# Patient Record
Sex: Male | Born: 1935 | Race: White | Hispanic: No | Marital: Married | Smoking: Former smoker
Health system: Southern US, Community
[De-identification: ages and names within clinical notes are randomized; demographics above are authoritative.]

## PROBLEM LIST (undated history)

## (undated) DIAGNOSIS — Z87438 Personal history of other diseases of male genital organs: Secondary | ICD-10-CM

## (undated) DIAGNOSIS — K5792 Diverticulitis of intestine, part unspecified, without perforation or abscess without bleeding: Secondary | ICD-10-CM

## (undated) DIAGNOSIS — I2510 Atherosclerotic heart disease of native coronary artery without angina pectoris: Secondary | ICD-10-CM

## (undated) DIAGNOSIS — I10 Essential (primary) hypertension: Secondary | ICD-10-CM

## (undated) DIAGNOSIS — E785 Hyperlipidemia, unspecified: Secondary | ICD-10-CM

## (undated) DIAGNOSIS — Z9289 Personal history of other medical treatment: Secondary | ICD-10-CM

## (undated) DIAGNOSIS — H353290 Exudative age-related macular degeneration, unspecified eye, stage unspecified: Secondary | ICD-10-CM

## (undated) DIAGNOSIS — K2270 Barrett's esophagus without dysplasia: Secondary | ICD-10-CM

## (undated) DIAGNOSIS — K5520 Angiodysplasia of colon without hemorrhage: Secondary | ICD-10-CM

## (undated) DIAGNOSIS — U071 COVID-19: Secondary | ICD-10-CM

## (undated) DIAGNOSIS — M549 Dorsalgia, unspecified: Secondary | ICD-10-CM

## (undated) DIAGNOSIS — K219 Gastro-esophageal reflux disease without esophagitis: Secondary | ICD-10-CM

## (undated) DIAGNOSIS — K279 Peptic ulcer, site unspecified, unspecified as acute or chronic, without hemorrhage or perforation: Secondary | ICD-10-CM

## (undated) DIAGNOSIS — Z8673 Personal history of transient ischemic attack (TIA), and cerebral infarction without residual deficits: Secondary | ICD-10-CM

## (undated) DIAGNOSIS — Z9889 Other specified postprocedural states: Secondary | ICD-10-CM

## (undated) DIAGNOSIS — K5790 Diverticulosis of intestine, part unspecified, without perforation or abscess without bleeding: Secondary | ICD-10-CM

## (undated) HISTORY — DX: Peptic ulcer, site unspecified, unspecified as acute or chronic, without hemorrhage or perforation: K27.9

## (undated) HISTORY — DX: Angiodysplasia of colon without hemorrhage: K55.20

## (undated) HISTORY — DX: Atherosclerotic heart disease of native coronary artery without angina pectoris: I25.10

## (undated) HISTORY — DX: Diverticulitis of intestine, part unspecified, without perforation or abscess without bleeding: K57.92

## (undated) HISTORY — DX: Gastro-esophageal reflux disease without esophagitis: K21.9

## (undated) HISTORY — DX: Personal history of other diseases of male genital organs: Z87.438

## (undated) HISTORY — DX: Other specified postprocedural states: Z98.890

## (undated) HISTORY — DX: Diverticulosis of intestine, part unspecified, without perforation or abscess without bleeding: K57.90

## (undated) HISTORY — DX: Personal history of transient ischemic attack (TIA), and cerebral infarction without residual deficits: Z86.73

## (undated) HISTORY — DX: Personal history of other medical treatment: Z92.89

## (undated) HISTORY — DX: Essential (primary) hypertension: I10

## (undated) HISTORY — DX: Barrett's esophagus without dysplasia: K22.70

## (undated) HISTORY — DX: Hyperlipidemia, unspecified: E78.5

## (undated) HISTORY — DX: Dorsalgia, unspecified: M54.9

## (undated) HISTORY — DX: Exudative age-related macular degeneration, unspecified eye, stage unspecified: H35.3290

---

## 1999-08-23 HISTORY — PX: INGUINAL HERNIA REPAIR: SUR1180

## 2000-01-24 ENCOUNTER — Encounter: Admission: RE | Admit: 2000-01-24 | Discharge: 2000-01-24 | Payer: Self-pay | Admitting: Surgery

## 2000-01-24 ENCOUNTER — Encounter: Payer: Self-pay | Admitting: Surgery

## 2000-01-24 ENCOUNTER — Ambulatory Visit (HOSPITAL_BASED_OUTPATIENT_CLINIC_OR_DEPARTMENT_OTHER): Admission: RE | Admit: 2000-01-24 | Discharge: 2000-01-24 | Payer: Self-pay | Admitting: Surgery

## 2003-08-06 ENCOUNTER — Encounter: Admission: RE | Admit: 2003-08-06 | Discharge: 2003-08-06 | Payer: Self-pay | Admitting: Surgery

## 2003-09-23 DIAGNOSIS — K2270 Barrett's esophagus without dysplasia: Secondary | ICD-10-CM

## 2003-09-23 HISTORY — DX: Barrett's esophagus without dysplasia: K22.70

## 2004-07-02 ENCOUNTER — Ambulatory Visit: Payer: Self-pay | Admitting: Internal Medicine

## 2004-07-14 ENCOUNTER — Ambulatory Visit: Payer: Self-pay | Admitting: Internal Medicine

## 2004-12-06 ENCOUNTER — Ambulatory Visit: Payer: Self-pay | Admitting: Family Medicine

## 2004-12-29 ENCOUNTER — Ambulatory Visit: Payer: Self-pay | Admitting: Internal Medicine

## 2005-01-26 ENCOUNTER — Ambulatory Visit: Payer: Self-pay | Admitting: Internal Medicine

## 2005-02-22 ENCOUNTER — Emergency Department (HOSPITAL_COMMUNITY): Admission: EM | Admit: 2005-02-22 | Discharge: 2005-02-22 | Payer: Self-pay | Admitting: Emergency Medicine

## 2005-02-23 ENCOUNTER — Ambulatory Visit: Payer: Self-pay | Admitting: Internal Medicine

## 2005-03-14 ENCOUNTER — Ambulatory Visit: Payer: Self-pay | Admitting: Gastroenterology

## 2005-03-22 ENCOUNTER — Ambulatory Visit: Payer: Self-pay | Admitting: Gastroenterology

## 2005-03-23 ENCOUNTER — Ambulatory Visit: Payer: Self-pay | Admitting: Internal Medicine

## 2005-03-24 ENCOUNTER — Ambulatory Visit: Payer: Self-pay | Admitting: Internal Medicine

## 2005-04-28 ENCOUNTER — Ambulatory Visit: Payer: Self-pay | Admitting: Internal Medicine

## 2005-05-04 ENCOUNTER — Ambulatory Visit: Payer: Self-pay | Admitting: Internal Medicine

## 2005-05-09 ENCOUNTER — Ambulatory Visit: Payer: Self-pay | Admitting: Family Medicine

## 2005-06-08 ENCOUNTER — Ambulatory Visit: Payer: Self-pay | Admitting: Internal Medicine

## 2005-08-03 ENCOUNTER — Ambulatory Visit: Payer: Self-pay | Admitting: Internal Medicine

## 2005-08-04 ENCOUNTER — Ambulatory Visit: Payer: Self-pay | Admitting: Internal Medicine

## 2005-09-09 ENCOUNTER — Ambulatory Visit: Payer: Self-pay | Admitting: Gastroenterology

## 2005-10-11 ENCOUNTER — Ambulatory Visit (HOSPITAL_COMMUNITY): Admission: RE | Admit: 2005-10-11 | Discharge: 2005-10-11 | Payer: Self-pay | Admitting: Gastroenterology

## 2005-10-11 ENCOUNTER — Ambulatory Visit: Payer: Self-pay | Admitting: Gastroenterology

## 2006-03-20 ENCOUNTER — Ambulatory Visit: Payer: Self-pay | Admitting: Internal Medicine

## 2006-03-29 ENCOUNTER — Ambulatory Visit: Payer: Self-pay | Admitting: Internal Medicine

## 2006-04-12 ENCOUNTER — Ambulatory Visit: Payer: Self-pay | Admitting: Internal Medicine

## 2006-04-13 ENCOUNTER — Encounter: Admission: RE | Admit: 2006-04-13 | Discharge: 2006-04-13 | Payer: Self-pay | Admitting: Internal Medicine

## 2006-04-27 ENCOUNTER — Ambulatory Visit: Payer: Self-pay | Admitting: Gastroenterology

## 2006-05-18 ENCOUNTER — Ambulatory Visit: Payer: Self-pay | Admitting: Internal Medicine

## 2006-05-23 LAB — CBC WITH DIFFERENTIAL/PLATELET
BASO%: 0.3 % (ref 0.0–2.0)
Basophils Absolute: 0 10*3/uL (ref 0.0–0.1)
EOS%: 1.7 % (ref 0.0–7.0)
Eosinophils Absolute: 0.1 10*3/uL (ref 0.0–0.5)
HCT: 49.9 % (ref 38.7–49.9)
HGB: 16.9 g/dL (ref 13.0–17.1)
LYMPH%: 14.8 % (ref 14.0–48.0)
MCH: 29.3 pg (ref 28.0–33.4)
MCHC: 33.9 g/dL (ref 32.0–35.9)
MCV: 86.3 fL (ref 81.6–98.0)
MONO#: 0.6 10*3/uL (ref 0.1–0.9)
MONO%: 7.6 % (ref 0.0–13.0)
NEUT#: 5.7 10*3/uL (ref 1.5–6.5)
NEUT%: 75.6 % — ABNORMAL HIGH (ref 40.0–75.0)
Platelets: 256 10*3/uL (ref 145–400)
RBC: 5.78 10*6/uL — ABNORMAL HIGH (ref 4.20–5.71)
RDW: 13.7 % (ref 11.2–14.6)
WBC: 7.6 10*3/uL (ref 4.0–10.0)
lymph#: 1.1 10*3/uL (ref 0.9–3.3)

## 2006-05-27 LAB — VON WILLEBRAND FACTOR MULTIMER
Factor-VIII Activity: 76 % (ref 75–150)
Ristocetin-Cofactor: 88 % (ref 50–150)
Von Willebrand Ag: 75 % normal (ref 61–164)
Von Willebrand Multimers: NORMAL

## 2006-06-06 ENCOUNTER — Ambulatory Visit: Payer: Self-pay | Admitting: Internal Medicine

## 2006-06-12 ENCOUNTER — Ambulatory Visit: Payer: Self-pay | Admitting: Internal Medicine

## 2006-06-15 ENCOUNTER — Encounter: Admission: RE | Admit: 2006-06-15 | Discharge: 2006-06-15 | Payer: Self-pay | Admitting: Internal Medicine

## 2006-06-27 ENCOUNTER — Ambulatory Visit: Payer: Self-pay | Admitting: Internal Medicine

## 2006-09-18 ENCOUNTER — Emergency Department (HOSPITAL_COMMUNITY): Admission: EM | Admit: 2006-09-18 | Discharge: 2006-09-18 | Payer: Self-pay | Admitting: Emergency Medicine

## 2006-10-20 ENCOUNTER — Ambulatory Visit: Payer: Self-pay | Admitting: Internal Medicine

## 2006-10-26 ENCOUNTER — Ambulatory Visit: Payer: Self-pay | Admitting: Gastroenterology

## 2006-11-09 ENCOUNTER — Encounter (INDEPENDENT_AMBULATORY_CARE_PROVIDER_SITE_OTHER): Payer: Self-pay | Admitting: *Deleted

## 2006-11-09 ENCOUNTER — Ambulatory Visit: Payer: Self-pay | Admitting: Gastroenterology

## 2007-04-17 ENCOUNTER — Ambulatory Visit: Payer: Self-pay | Admitting: Internal Medicine

## 2007-04-25 ENCOUNTER — Ambulatory Visit: Payer: Self-pay | Admitting: Internal Medicine

## 2007-05-15 ENCOUNTER — Encounter: Payer: Self-pay | Admitting: *Deleted

## 2007-05-15 DIAGNOSIS — K649 Unspecified hemorrhoids: Secondary | ICD-10-CM | POA: Insufficient documentation

## 2007-05-15 DIAGNOSIS — E7800 Pure hypercholesterolemia, unspecified: Secondary | ICD-10-CM | POA: Insufficient documentation

## 2007-05-15 DIAGNOSIS — S02650A Fracture of angle of mandible, unspecified side, initial encounter for closed fracture: Secondary | ICD-10-CM | POA: Insufficient documentation

## 2007-05-15 DIAGNOSIS — I10 Essential (primary) hypertension: Secondary | ICD-10-CM | POA: Insufficient documentation

## 2007-06-18 ENCOUNTER — Encounter: Payer: Self-pay | Admitting: Internal Medicine

## 2007-06-18 ENCOUNTER — Ambulatory Visit: Payer: Self-pay | Admitting: Internal Medicine

## 2007-06-26 ENCOUNTER — Ambulatory Visit: Payer: Self-pay | Admitting: Internal Medicine

## 2007-06-26 LAB — CONVERTED CEMR LAB
ALT: 26 units/L (ref 0–53)
AST: 26 units/L (ref 0–37)
BUN: 13 mg/dL (ref 6–23)
CO2: 29 meq/L (ref 19–32)
Calcium: 9.4 mg/dL (ref 8.4–10.5)
Chloride: 105 meq/L (ref 96–112)
Cholesterol: 176 mg/dL (ref 0–200)
Creatinine, Ser: 1.1 mg/dL (ref 0.4–1.5)
GFR calc Af Amer: 83 mL/min
GFR calc non Af Amer: 69 mL/min
Glucose, Bld: 99 mg/dL (ref 70–99)
HDL: 40.4 mg/dL (ref >39.0)
LDL Cholesterol: 100 mg/dL — ABNORMAL HIGH (ref 0–99)
PSA: 2.09 ng/mL (ref 0.10–4.00)
Potassium: 3.9 meq/L (ref 3.5–5.1)
Sodium: 140 meq/L (ref 135–145)
Total CHOL/HDL Ratio: 4.4
Triglycerides: 177 mg/dL — ABNORMAL HIGH (ref 0–149)
VLDL: 35 mg/dL (ref 0–40)

## 2007-07-05 ENCOUNTER — Ambulatory Visit: Payer: Self-pay | Admitting: Internal Medicine

## 2007-07-31 ENCOUNTER — Ambulatory Visit: Payer: Self-pay | Admitting: Internal Medicine

## 2007-08-28 ENCOUNTER — Ambulatory Visit: Payer: Self-pay | Admitting: Internal Medicine

## 2007-09-12 ENCOUNTER — Ambulatory Visit: Payer: Self-pay | Admitting: Internal Medicine

## 2007-09-12 ENCOUNTER — Telehealth: Payer: Self-pay | Admitting: Internal Medicine

## 2007-09-12 LAB — CONVERTED CEMR LAB
BUN: 17 mg/dL (ref 6–23)
Bilirubin Urine: NEGATIVE
Creatinine, Ser: 1.1 mg/dL (ref 0.4–1.5)
Hemoglobin, Urine: NEGATIVE
Ketones, ur: NEGATIVE mg/dL
Leukocytes, UA: NEGATIVE
Nitrite: NEGATIVE
Specific Gravity, Urine: <=1.005 (ref 1.000–1.03)
Total Protein, Urine: NEGATIVE mg/dL
Urine Glucose: NEGATIVE mg/dL
Urobilinogen, UA: 0.2 (ref 0.0–1.0)
pH: 5.5 (ref 5.0–8.0)

## 2007-09-14 ENCOUNTER — Ambulatory Visit: Payer: Self-pay | Admitting: Internal Medicine

## 2007-09-18 ENCOUNTER — Telehealth: Payer: Self-pay | Admitting: Internal Medicine

## 2007-09-21 ENCOUNTER — Ambulatory Visit: Payer: Self-pay | Admitting: Internal Medicine

## 2007-09-22 DIAGNOSIS — K429 Umbilical hernia without obstruction or gangrene: Secondary | ICD-10-CM | POA: Insufficient documentation

## 2007-10-16 ENCOUNTER — Ambulatory Visit: Payer: Self-pay | Admitting: Internal Medicine

## 2007-10-16 DIAGNOSIS — M26609 Unspecified temporomandibular joint disorder, unspecified side: Secondary | ICD-10-CM | POA: Insufficient documentation

## 2007-10-31 ENCOUNTER — Ambulatory Visit: Payer: Self-pay | Admitting: Endocrinology

## 2007-12-24 ENCOUNTER — Telehealth: Payer: Self-pay | Admitting: Gastroenterology

## 2008-03-27 ENCOUNTER — Encounter: Payer: Self-pay | Admitting: Gastroenterology

## 2008-04-17 ENCOUNTER — Telehealth: Payer: Self-pay | Admitting: Internal Medicine

## 2008-04-29 ENCOUNTER — Telehealth: Payer: Self-pay | Admitting: Gastroenterology

## 2008-04-29 ENCOUNTER — Telehealth: Payer: Self-pay | Admitting: Internal Medicine

## 2008-05-16 ENCOUNTER — Ambulatory Visit: Payer: Self-pay | Admitting: Gastroenterology

## 2008-05-16 DIAGNOSIS — K2270 Barrett's esophagus without dysplasia: Secondary | ICD-10-CM | POA: Insufficient documentation

## 2008-06-04 ENCOUNTER — Ambulatory Visit: Payer: Self-pay | Admitting: Internal Medicine

## 2008-07-07 ENCOUNTER — Ambulatory Visit: Payer: Self-pay | Admitting: Internal Medicine

## 2008-07-07 LAB — CONVERTED CEMR LAB
ALT: 20 units/L (ref 0–53)
AST: 26 units/L (ref 0–37)
Albumin: 4 g/dL (ref 3.5–5.2)
Alkaline Phosphatase: 75 units/L (ref 39–117)
BUN: 15 mg/dL (ref 6–23)
Basophils Absolute: 0.1 10*3/uL (ref 0.0–0.1)
Basophils Relative: 0.8 % (ref 0.0–3.0)
Bilirubin, Direct: 0.1 mg/dL (ref 0.0–0.3)
CO2: 28 meq/L (ref 19–32)
Calcium: 8.6 mg/dL (ref 8.4–10.5)
Chloride: 107 meq/L (ref 96–112)
Cholesterol: 168 mg/dL (ref 0–200)
Creatinine, Ser: 1.3 mg/dL (ref 0.4–1.5)
Direct LDL: 79.3 mg/dL
Eosinophils Absolute: 0.3 10*3/uL (ref 0.0–0.7)
Eosinophils Relative: 3.9 % (ref 0.0–5.0)
GFR calc Af Amer: 68 mL/min
GFR calc non Af Amer: 56 mL/min
Glucose, Bld: 84 mg/dL (ref 70–99)
HCT: 44.1 % (ref 39.0–52.0)
HDL: 35.8 mg/dL — ABNORMAL LOW (ref >39.0)
Hemoglobin: 15.4 g/dL (ref 13.0–17.0)
Lymphocytes Relative: 33.3 % (ref 12.0–46.0)
MCHC: 34.9 g/dL (ref 30.0–36.0)
MCV: 89.6 fL (ref 78.0–100.0)
Monocytes Absolute: 0.7 10*3/uL (ref 0.1–1.0)
Monocytes Relative: 8.3 % (ref 3.0–12.0)
Neutro Abs: 4.4 10*3/uL (ref 1.4–7.7)
Neutrophils Relative %: 53.7 % (ref 43.0–77.0)
PSA: 2.49 ng/mL (ref 0.10–4.00)
Platelets: 179 10*3/uL (ref 150–400)
Potassium: 3.4 meq/L — ABNORMAL LOW (ref 3.5–5.1)
RBC: 4.92 M/uL (ref 4.22–5.81)
RDW: 12.3 % (ref 11.5–14.6)
Sodium: 141 meq/L (ref 135–145)
Total Bilirubin: 0.6 mg/dL (ref 0.3–1.2)
Total CHOL/HDL Ratio: 4.7
Total Protein: 6.7 g/dL (ref 6.0–8.3)
Triglycerides: 238 mg/dL (ref 0–149)
VLDL: 48 mg/dL — ABNORMAL HIGH (ref 0–40)
WBC: 8.2 10*3/uL (ref 4.5–10.5)

## 2008-08-20 ENCOUNTER — Telehealth: Payer: Self-pay | Admitting: Gastroenterology

## 2008-08-21 ENCOUNTER — Ambulatory Visit: Payer: Self-pay | Admitting: Internal Medicine

## 2008-08-21 DIAGNOSIS — R1319 Other dysphagia: Secondary | ICD-10-CM | POA: Insufficient documentation

## 2008-08-23 DIAGNOSIS — D680 Von Willebrand's disease: Secondary | ICD-10-CM | POA: Insufficient documentation

## 2008-08-27 ENCOUNTER — Encounter: Payer: Self-pay | Admitting: Gastroenterology

## 2008-08-27 ENCOUNTER — Ambulatory Visit: Payer: Self-pay | Admitting: Gastroenterology

## 2008-08-29 ENCOUNTER — Encounter: Payer: Self-pay | Admitting: Gastroenterology

## 2008-09-03 ENCOUNTER — Encounter: Payer: Self-pay | Admitting: Gastroenterology

## 2008-10-01 ENCOUNTER — Telehealth: Payer: Self-pay | Admitting: Gastroenterology

## 2008-10-15 ENCOUNTER — Ambulatory Visit: Payer: Self-pay | Admitting: Gastroenterology

## 2008-10-22 ENCOUNTER — Ambulatory Visit: Payer: Self-pay | Admitting: Gastroenterology

## 2008-10-22 LAB — HM COLONOSCOPY

## 2008-11-25 ENCOUNTER — Telehealth: Payer: Self-pay | Admitting: Internal Medicine

## 2009-02-24 ENCOUNTER — Ambulatory Visit: Payer: Self-pay | Admitting: Internal Medicine

## 2009-03-09 ENCOUNTER — Telehealth: Payer: Self-pay | Admitting: Internal Medicine

## 2009-05-05 ENCOUNTER — Telehealth: Payer: Self-pay | Admitting: Gastroenterology

## 2009-05-06 ENCOUNTER — Encounter: Payer: Self-pay | Admitting: Internal Medicine

## 2009-05-29 ENCOUNTER — Encounter: Payer: Self-pay | Admitting: Internal Medicine

## 2009-06-03 ENCOUNTER — Ambulatory Visit: Payer: Self-pay | Admitting: Internal Medicine

## 2009-06-03 ENCOUNTER — Telehealth (INDEPENDENT_AMBULATORY_CARE_PROVIDER_SITE_OTHER): Payer: Self-pay | Admitting: *Deleted

## 2009-06-03 ENCOUNTER — Encounter (INDEPENDENT_AMBULATORY_CARE_PROVIDER_SITE_OTHER): Payer: Self-pay

## 2009-06-09 ENCOUNTER — Telehealth: Payer: Self-pay | Admitting: Internal Medicine

## 2009-07-01 ENCOUNTER — Encounter: Payer: Self-pay | Admitting: Internal Medicine

## 2009-07-15 ENCOUNTER — Telehealth (INDEPENDENT_AMBULATORY_CARE_PROVIDER_SITE_OTHER): Payer: Self-pay | Admitting: *Deleted

## 2009-07-21 ENCOUNTER — Encounter: Payer: Self-pay | Admitting: Internal Medicine

## 2009-07-27 ENCOUNTER — Ambulatory Visit: Payer: Self-pay | Admitting: Internal Medicine

## 2009-07-27 DIAGNOSIS — H9190 Unspecified hearing loss, unspecified ear: Secondary | ICD-10-CM | POA: Insufficient documentation

## 2009-07-27 LAB — CONVERTED CEMR LAB
ALT: 20 units/L (ref 0–53)
AST: 21 units/L (ref 0–37)
Albumin: 4.3 g/dL (ref 3.5–5.2)
Alkaline Phosphatase: 89 units/L (ref 39–117)
BUN: 15 mg/dL (ref 6–23)
Basophils Absolute: 0.1 10*3/uL (ref 0.0–0.1)
Basophils Relative: 0.8 % (ref 0.0–3.0)
Bilirubin, Direct: 0.1 mg/dL (ref 0.0–0.3)
CO2: 31 meq/L (ref 19–32)
Calcium: 9.3 mg/dL (ref 8.4–10.5)
Chloride: 103 meq/L (ref 96–112)
Cholesterol: 191 mg/dL (ref 0–200)
Creatinine, Ser: 1.1 mg/dL (ref 0.4–1.5)
Direct LDL: 105.9 mg/dL
Eosinophils Absolute: 0.2 10*3/uL (ref 0.0–0.7)
Eosinophils Relative: 3.2 % (ref 0.0–5.0)
GFR calc non Af Amer: 68.15 mL/min (ref >60)
Glucose, Bld: 94 mg/dL (ref 70–99)
HCT: 49.6 % (ref 39.0–52.0)
HDL: 47.5 mg/dL (ref >39.00)
Hemoglobin: 17 g/dL (ref 13.0–17.0)
Lymphocytes Relative: 24.6 % (ref 12.0–46.0)
Lymphs Abs: 1.8 10*3/uL (ref 0.7–4.0)
MCHC: 34.3 g/dL (ref 30.0–36.0)
MCV: 88.8 fL (ref 78.0–100.0)
Monocytes Absolute: 0.7 10*3/uL (ref 0.1–1.0)
Monocytes Relative: 9.2 % (ref 3.0–12.0)
Neutro Abs: 4.7 10*3/uL (ref 1.4–7.7)
Neutrophils Relative %: 62.2 % (ref 43.0–77.0)
PSA: 2.47 ng/mL (ref 0.10–4.00)
Platelets: 198 10*3/uL (ref 150.0–400.0)
Potassium: 4.2 meq/L (ref 3.5–5.1)
RBC: 5.58 M/uL (ref 4.22–5.81)
RDW: 12.4 % (ref 11.5–14.6)
Sodium: 141 meq/L (ref 135–145)
Total Bilirubin: 0.8 mg/dL (ref 0.3–1.2)
Total CHOL/HDL Ratio: 4
Total Protein: 7.3 g/dL (ref 6.0–8.3)
Triglycerides: 205 mg/dL — ABNORMAL HIGH (ref 0.0–149.0)
VLDL: 41 mg/dL — ABNORMAL HIGH (ref 0.0–40.0)
WBC: 7.5 10*3/uL (ref 4.5–10.5)
aPTT: 29.4 s — ABNORMAL HIGH (ref 21.7–28.8)

## 2009-09-01 ENCOUNTER — Encounter: Payer: Self-pay | Admitting: Internal Medicine

## 2009-09-22 ENCOUNTER — Encounter: Payer: Self-pay | Admitting: Gastroenterology

## 2009-10-01 ENCOUNTER — Encounter: Payer: Self-pay | Admitting: Gastroenterology

## 2009-10-05 ENCOUNTER — Encounter: Payer: Self-pay | Admitting: Internal Medicine

## 2009-11-03 ENCOUNTER — Encounter: Payer: Self-pay | Admitting: Internal Medicine

## 2009-11-03 ENCOUNTER — Telehealth: Payer: Self-pay | Admitting: Gastroenterology

## 2009-11-04 ENCOUNTER — Telehealth: Payer: Self-pay | Admitting: Internal Medicine

## 2009-11-30 ENCOUNTER — Ambulatory Visit: Payer: Self-pay | Admitting: Gastroenterology

## 2009-11-30 DIAGNOSIS — K5520 Angiodysplasia of colon without hemorrhage: Secondary | ICD-10-CM | POA: Insufficient documentation

## 2009-12-07 ENCOUNTER — Encounter: Payer: Self-pay | Admitting: Internal Medicine

## 2009-12-08 ENCOUNTER — Encounter: Payer: Self-pay | Admitting: Internal Medicine

## 2009-12-17 ENCOUNTER — Ambulatory Visit: Payer: Self-pay | Admitting: Internal Medicine

## 2009-12-18 ENCOUNTER — Encounter: Payer: Self-pay | Admitting: Internal Medicine

## 2009-12-18 LAB — CONVERTED CEMR LAB
ALT: 22 units/L (ref 0–53)
AST: 23 units/L (ref 0–37)
Albumin: 3.8 g/dL (ref 3.5–5.2)
Alkaline Phosphatase: 74 units/L (ref 39–117)
BUN: 10 mg/dL (ref 6–23)
Bilirubin, Direct: 0.1 mg/dL (ref 0.0–0.3)
CO2: 31 meq/L (ref 19–32)
Calcium: 9.1 mg/dL (ref 8.4–10.5)
Chloride: 104 meq/L (ref 96–112)
Creatinine, Ser: 1.1 mg/dL (ref 0.4–1.5)
GFR calc non Af Amer: 68.09 mL/min (ref >60)
Glucose, Bld: 89 mg/dL (ref 70–99)
Phosphorus: 3.5 mg/dL (ref 2.3–4.6)
Potassium: 4.2 meq/L (ref 3.5–5.1)
Sodium: 140 meq/L (ref 135–145)
Total Bilirubin: 0.6 mg/dL (ref 0.3–1.2)
Total CK: 142 units/L (ref 7–232)
Total Protein: 6.5 g/dL (ref 6.0–8.3)

## 2010-01-04 ENCOUNTER — Encounter: Payer: Self-pay | Admitting: Internal Medicine

## 2010-01-22 ENCOUNTER — Telehealth: Payer: Self-pay | Admitting: Internal Medicine

## 2010-03-22 ENCOUNTER — Encounter: Payer: Self-pay | Admitting: Internal Medicine

## 2010-05-20 ENCOUNTER — Ambulatory Visit: Payer: Self-pay | Admitting: Internal Medicine

## 2010-07-29 ENCOUNTER — Encounter: Payer: Self-pay | Admitting: Internal Medicine

## 2010-07-29 ENCOUNTER — Ambulatory Visit: Payer: Self-pay | Admitting: Internal Medicine

## 2010-07-29 LAB — CONVERTED CEMR LAB
ALT: 17 units/L (ref 0–53)
AST: 22 units/L (ref 0–37)
Albumin: 4.1 g/dL (ref 3.5–5.2)
Alkaline Phosphatase: 75 units/L (ref 39–117)
BUN: 17 mg/dL (ref 6–23)
Basophils Absolute: 0 10*3/uL (ref 0.0–0.1)
Basophils Relative: 0.2 % (ref 0.0–3.0)
Bilirubin, Direct: 0.1 mg/dL (ref 0.0–0.3)
CO2: 29 meq/L (ref 19–32)
Calcium: 9.3 mg/dL (ref 8.4–10.5)
Chloride: 103 meq/L (ref 96–112)
Cholesterol: 238 mg/dL — ABNORMAL HIGH (ref 0–200)
Creatinine, Ser: 1 mg/dL (ref 0.4–1.5)
Direct LDL: 148.8 mg/dL
Eosinophils Absolute: 0.1 10*3/uL (ref 0.0–0.7)
Eosinophils Relative: 0.6 % (ref 0.0–5.0)
GFR calc non Af Amer: 72.53 mL/min (ref >60.00)
Glucose, Bld: 101 mg/dL — ABNORMAL HIGH (ref 70–99)
HCT: 44.9 % (ref 39.0–52.0)
HDL: 45 mg/dL (ref >39.00)
Hemoglobin: 15.3 g/dL (ref 13.0–17.0)
Lymphocytes Relative: 10.4 % — ABNORMAL LOW (ref 12.0–46.0)
Lymphs Abs: 1.3 10*3/uL (ref 0.7–4.0)
MCHC: 34 g/dL (ref 30.0–36.0)
MCV: 87.6 fL (ref 78.0–100.0)
Monocytes Absolute: 1 10*3/uL (ref 0.1–1.0)
Monocytes Relative: 8 % (ref 3.0–12.0)
Neutro Abs: 10.2 10*3/uL — ABNORMAL HIGH (ref 1.4–7.7)
Neutrophils Relative %: 80.8 % — ABNORMAL HIGH (ref 43.0–77.0)
Platelets: 237 10*3/uL (ref 150.0–400.0)
Potassium: 4.3 meq/L (ref 3.5–5.1)
RBC: 5.13 M/uL (ref 4.22–5.81)
RDW: 14 % (ref 11.5–14.6)
Sodium: 140 meq/L (ref 135–145)
TSH: 2.36 microintl units/mL (ref 0.35–5.50)
Total Bilirubin: 0.6 mg/dL (ref 0.3–1.2)
Total CHOL/HDL Ratio: 5
Total Protein: 7 g/dL (ref 6.0–8.3)
Triglycerides: 134 mg/dL (ref 0.0–149.0)
VLDL: 26.8 mg/dL (ref 0.0–40.0)
WBC: 12.7 10*3/uL — ABNORMAL HIGH (ref 4.5–10.5)

## 2010-08-04 ENCOUNTER — Telehealth: Payer: Self-pay | Admitting: Internal Medicine

## 2010-08-05 ENCOUNTER — Telehealth: Payer: Self-pay | Admitting: Internal Medicine

## 2010-09-23 NOTE — Assessment & Plan Note (Signed)
Summary: VOMIT'G ALL NIGHT-$50 INFO-DR NORINS PT-STC   Vital Signs:  Patient Profile:   77 Years Old Male Height:     68 inches Weight:      170.9 pounds Temp:     97.5 degrees F oral Pulse rate:   87 / minute BP sitting:   147 / 83  (left arm) Cuff size:   regular  Vitals Entered By: Orlan Leavens (October 31, 2007 8:49 AM)                 Visit Type:  Acute Visit PCP:  Norins  Chief Complaint:  HAVING ABDOMINAL PAIN, DIARRHEA, and BEEN VOMITTING ALL NIGHT.  History of Present Illness: 2 days n/v/d/abd cramps    Current Allergies: No known allergies   Past Medical History:    Reviewed history from 05/15/2007 and no changes required:       Hx of FX, ANGLE OF JAW, CLOSED (ICD-802.25)       BARRETT'S ESOPHAGUS, HX OF (ICD-V12.79)       GERD (ICD-530.81)       PROSTATITIS, HX OF (ICD-V13.09)       Hx of HEMORRHOIDS (ICD-455.6)       HYPERLIPIDEMIA (ICD-272.4)       HYPERTENSION (ICD-401.9)             Review of Systems  The patient denies fever.     Physical Exam  General:     normal appearance.   Abdomen:     abdomen soft and non-tender without masses, organomegaly, or hernias noted    Impression & Recommendations:  Problem # 1:  GASTROENTERITIS (ICD-558.9)  Orders: Est. Patient Level III ([address])   Medications Added to Medication List This Visit: 1)  Zofran Odt 4 Mg Tbdp (Ondansetron) .... Q4h as needed nausea   Patient Instructions: 1)  zofran odt 4 mg q4h as needed 2)  tylenol/fluids 3)  ret prn    Prescriptions: ZOFRAN ODT 4 MG  TBDP (ONDANSETRON) q4h as needed nausea  #30 x 0   Entered and Authorized by:   Minus Breeding MD   Signed by:   Minus Breeding MD on 10/31/2007   Method used:   Electronically sent to ...       [facility]*       [address]       [place], [place]  [address]       Ph: [phone] or [phone]       Fax: [phone]   RxID:   [id number]  ]

## 2010-09-23 NOTE — Assessment & Plan Note (Signed)
Summary: severe cough -sinus drainage-$50--stc   Vital Signs:  Patient profile:   77 year old male Height:      68 inches Weight:      163.75 pounds BMI:     24.99 O2 Sat:      95 % on Room air Temp:     97.7 degrees F oral Pulse rate:   82 / minute BP sitting:   132 / 62  (left arm) Cuff size:   regular  Vitals Entered By: Beola Cord, CMA (February 24, 2009 9:43 AM)  O2 Flow:  Room air  Primary Care [facility]ta[facility] [place]:  Illene Regulus, MD   History of Present Illness: Two week history of cough with lots of phlegm -mostly clear, no fever, no pain, no chest discomfort. Not more dyspneic than usual. He has not been taking any medications. He did try OTC allergy pills without success, cough drops didn't help.   Current Medications (verified): 1)  Aspirin 81 Mg  Tabs (Aspirin) .... Take One Tablet Once Daily 2)  Lovastatin 40 Mg  Tabs (Lovastatin) .... Take One Tablet Once Daily 3)  Gabapentin 300 Mg  Caps (Gabapentin) .... Take One Tablet 3 Times Daily 4)  Tylenol .... Take As Needed 5)  Norvasc 10 Mg Tabs (Amlodipine Besylate) .... Take 1 Tablet By Mouth Once A Day 6)  Nexium 40 Mg Cpdr (Esomeprazole Magnesium) .... One Tablet By Mouth Two Times A Day 7)  Alrex 0.2 % Susp (Loteprednol Etabonate) .... 2 Drops Once Daily As Needed 8)  Multivitamins  Tabs (Multiple Vitamin) .... Take 1 Tablet By Mouth Once A Day  Allergies (verified): 1)  ! 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  The patient denies anorexia, fever,  weight loss, weight gain, hoarseness, chest pain, dyspnea on exertion, headaches, hemoptysis, abdominal pain, muscle weakness, and depression.    Physical Exam  General:  WNWD white male NAD Head:  Normocephalic and atraumatic without obvious abnormalities. No apparent alopecia or balding. Lungs:  normal respiratory effort, normal breath sounds, no crackles, and no wheezes.   Heart:  normal rate and regular rhythm.     Impression & Recommendations:  Problem # 1:  COUGH (ICD-786.2)  Cough that is persistent; suspect cyclical cough.  Plan: CXR          Z-pak, promethazine/cod 1 tsp q 6, tessalon perles, prednisone 20 mg once daily x 7  Orders: T-2 View CXR (71020TC)  Complete Medication List: 1)  Aspirin 81 Mg Tabs (Aspirin) .... Take one tablet once daily 2)  Lovastatin 40 Mg Tabs (Lovastatin) .... Take one tablet once daily 3)  Gabapentin 300 Mg Caps (Gabapentin) .... Take one tablet 3 times daily 4)  Tylenol  .... Take as needed 5)  Norvasc 10 Mg Tabs (Amlodipine besylate) .... Take 1 tablet by mouth once a day 6)  Nexium 40 Mg Cpdr (Esomeprazole magnesium) .... One tablet by mouth two times a day 7)  Alrex 0.2 % Susp (Loteprednol etabonate) .... 2 drops once daily as needed 8)  Multivitamins Tabs (Multiple vitamin) .... Take 1 tablet by mouth once a day 9)  Promethazine-codeine 6.25-10 Mg/45ml Syrp (Promethazine-codeine) .[name] Kitchen.. 1 tsp q 6 as needed for cough 10)  Azithromycin 250 Mg Tabs (Azithromycin) .... 2 tabs day #1, 1 tab days 2-5 11)  Benzonatate 100 Mg Caps (Benzonatate) .[name] Kitchen.. 1 by mouth three times a day x 10 days for cough 12)  Prednisone 20 Mg Tabs (Prednisone) .[name] Kitchen.. 1 by mouth once daily x 10  Patient Instructions: 1)  cough - either a viral upper respiratory infection or perhaps mycoplasma. You also have a cyclical cough. Plan - z-pak as directed, codeine containing cough syrup every 6 hours as needed, benzonatate perles three times a day to sooth the trachea, prednisone  20mg  daily to help with tracheal irritation. Hydrate.  Prescriptions: PREDNISONE 20 MG TABS (PREDNISONE) 1 by mouth once daily x 10  #10 x 0   Entered and Authorized by:   Jacques Navy MD   Signed by:   Jacques Navy MD on 02/24/2009   Method used:   Electronically to        [facility]* (retail)       [address]       [place], [place]  [address]       Ph: [phone]       Fax: [phone]   RxID:   [id number] BENZONATATE 100 MG CAPS (BENZONATATE) 1 by mouth three times a day x 10 days for cough  #30 x 0   Entered and Authorized by:   Jacques Navy MD   Signed by:   Jacques Navy MD on 02/24/2009   Method used:   Electronically to        [facility]* (retail)       [address]       [place], [place]  [address]       Ph: [phone]       Fax: [phone]   RxID:   [id number] AZITHROMYCIN 250 MG TABS (AZITHROMYCIN) 2 tabs day #1, 1 tab days 2-5  #6 x 0   Entered and Authorized by:   Jacques Navy MD   Signed by:   Jacques Navy MD on 02/24/2009   Method used:   Electronically to        [facility]* (retail)       [address]       [place], [place]  [address]       Ph: [phone]       Fax: [phone]   RxID:   [id number] PROMETHAZINE-CODEINE 6.25-10 MG/5ML SYRP (PROMETHAZINE-CODEINE) 1 tsp q 6 as needed for cough  #8 oz x 1   Entered and Authorized by:   Jacques Navy MD   Signed by:   Jacques Navy MD on 02/24/2009   Method used:   Print then Give to Patient   RxID:   [id number]

## 2010-09-23 NOTE — Progress Notes (Signed)
  Phone Note Other Incoming   Caller: Pts wife-Emma Details for Reason: call regarding Lipitor Summary of Call: Pts wife is calling and states that pt is allergic to lipitor and cannot take this medication. She stated that she would like to speak with Dr Debby Bud personally this morning.  Initial call taken by: Ami Bullins CMA,  August 05, 2010 9:01 AM  Follow-up for Phone Call        called patient: my mistake on lipitor. his LDL 148.8 can treat with diet alone. Spoke with pt and his wife. They are ok with diet alone.  Follow-up by: Jacques Navy MD,  August 05, 2010 9:26 AM

## 2010-09-23 NOTE — Progress Notes (Signed)
Summary: Recall colon   Phone Note Outgoing Call Call back at [facility] Phone [phone]   Call placed by: Christie Nottingham CMA,  October 01, 2008 3:15 PM Call placed to: Patient Summary of Call: Called pt to schedule recall colonoscopy. Pt agreed and scheduled for 10-22-08  and previsit on 10/15/08.  Initial call taken by: Christie Nottingham CMA,  October 01, 2008 3:16 PM

## 2010-09-23 NOTE — Consult Note (Signed)
Summary: [facility]  [facility]   Imported By: Lester [place] 11/06/2009 10:27:06  _____________________________________________________________________  External Attachment:    Type:   Image     Comment:   External Document

## 2010-09-23 NOTE — Procedures (Signed)
Summary: Colonoscopy   Colonoscopy  Procedure date:  10/22/2008  Findings:      Location:  [facility].    COLONOSCOPY PROCEDURE REPORT  PATIENT:  [name]  MR#:  [id number] BIRTHDATE:   03-Dec-1925   GENDER:   male  ENDOSCOPIST:   Venita Lick. Russella Dar, MD, [facility] Referred by:   PROCEDURE DATE:  10/22/2008 PROCEDURE:  Average-risk screening colonoscopy ASA CLASS:   Class II INDICATIONS: 1) screening   MEDICATIONS:    Fentanyl 50 mcg IV, Versed 4 mg IV  DESCRIPTION OF PROCEDURE:   After the risks benefits and alternatives of the procedure were thoroughly explained, informed consent was obtained.  Digital rectal exam was performed and revealed no abnormalities.   The LB [id number] endoscope was introduced through the anus and advanced to the cecum, which was identified by both the appendix and ileocecal valve, without limitations.  The quality of the prep was excellent, using MoviPrep.  Thin red fluid was noted in several areas throughout the colon and we questioned the patients wife who related that he drank red Gatorade with the prep. The instrument was then slowly withdrawn as the colon was fully examined. <<PROCEDUREIMAGES>>          <<OLD IMAGES>>  FINDINGS:  Mild diverticulosis was found in the sigmoid colon.  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 It was non-bleeding. It was 3 mm in size. This was otherwise a normal examination.  Retroflexed views in the rectum revealed internal hemorrhoids, medium. The time to cecum =  3.5  minutes. The scope was then withdrawn (time =  10.75  min) from the patient and the procedure completed.  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    Venita Lick. Russella Dar, MD,  [facility]    CC: Jacques Navy MD

## 2010-09-23 NOTE — Assessment & Plan Note (Signed)
   Vital Signs:  Patient Profile:   77 Years Old Male Height:     68 inches Weight:      171 pounds Temp:     97.7 degrees F oral Pulse rate:   76 / minute BP sitting:   158 / 87                 PCP:  Norins   History of Present Illness: [name] presents to day with low back pain, lower abdominal, urinary and frequency. NO fever, chills, no N/V.    Past Surgical History:    Inguinal herniorrhaphy-2001   Family History:    father-died at 98. CAD/MI    mother-died 86. HTN, cancer-pancreatic    1 sister - arthritis, Lipids    1 brother - bone cancer secondary to prostate cancer    No DM, colon cancer  Social History:    married 10-Jan-1951    1 son, 1 daughter-deceased at 5 months    retired-fulltime. "[employer]"   Risk Factors:  Passive smoke exposure:  no Drug use:  no HIV high-risk behavior:  no Caffeine use:  1 drinks per day Alcohol use:  no Exercise:  no Seatbelt use:  100 %   Review of Systems  The patient denies fever, weight loss, chest pain, syncope, dyspnea on exhertion, and prolonged cough.     Physical Exam  General:     Well-developed,well-nourished,in no acute distress; alert,appropriate and cooperative throughout examination    Impression & Recommendations:  Problem # 1:  PROSTATITIS, ACUTE (ICD-601.0) classic symptoms. Similar to previous episode of pratatitis. Deferring rectal exam based on history and symptoms.  Plan: cipro 500mg  two times a day x 14  Complete Medication List: 1)  Aspirin 81 Mg Tabs (Aspirin) .... Take one tablet once daily 2)  Omeprazole 20 Mg Tbec (Omeprazole) .... Take 2 tablets twice daily 3)  Lovastatin 40 Mg Tabs (Lovastatin) .... Take one tablet once daily 4)  Gabapentin 300 Mg Caps (Gabapentin) .... Take one tablet 3 times daily 5)  Tylenol  .... Take as needed 6)  Hydrochlorothiazide 25 Mg Tabs (Hydrochlorothiazide) .... Take 1 tablet by mouth once a day 7)  Norvasc 10 Mg Tabs (Amlodipine besylate)  .... Take 1 tablet by mouth once a day 8)  Cipro 500 Mg Tabs (Ciprofloxacin hcl) .... Two times a day   Patient Instructions: 1)  Please schedule a follow-up appointment as needed. 2)  Take antibiotics as instructed. Drink a lot of fluids.    Prescriptions: CIPRO 500 MG  TABS (CIPROFLOXACIN HCL) two times a day  #28 x 1   Entered and Authorized by:   Martin Navy MD   Signed by:   Martin Navy MD on 06/18/2007   Method used:   Print then Give to Patient   RxID:   [id number]  ]

## 2010-09-23 NOTE — Consult Note (Signed)
Summary: [facility]  [facility]   Imported By: Sherian Rein 12/10/2009 13:58:10  _____________________________________________________________________  External Attachment:    Type:   Image     Comment:   External Document

## 2010-09-23 NOTE — Progress Notes (Signed)
Summary: Nexium samples   Phone Note Call from Patient Call back at Home Phone [phone]   Call For: Dr Russella Dar Reason for Call: Talk to Nurse Summary of Call: Larey Seat into [facility] hole and wonders if we have any Nexium 40mg  they can have please. Initial call taken by: Leanor Kail [facility],  May 05, 2009 11:14 AM  Follow-up for Phone Call        pts wife aware samples out front for pick up.  Follow-up by: Harlow Mares CMA (AAMA),  May 05, 2009 1:38 PM    New/Updated Medications: NEXIUM 40 MG CPDR (ESOMEPRAZOLE MAGNESIUM) one tablet by mouth two times a day

## 2010-09-23 NOTE — Progress Notes (Signed)
Summary: Medication  Medications Added NEXIUM 40 MG CPDR (ESOMEPRAZOLE MAGNESIUM) one tablet by mouth two times a day  Keep appt for any further refills.       Phone Note Call from Patient Call back at [facility] Phone [phone]   Caller: Patient Call For: Dr. Russella Dar Reason for Call: Talk to Nurse Summary of Call: Pt needs his Nexium refilled has an appt. on 11-30-09 and completely out Initial call taken by: Karna Christmas,  November 03, 2009 11:22 AM  Follow-up for Phone Call        Rx was sent to pts pharmacy and pt notified to keep appt for any further refills. Follow-up by: Christie Nottingham CMA Duncan Dull),  November 03, 2009 11:49 AM    New/Updated Medications: NEXIUM 40 MG CPDR (ESOMEPRAZOLE MAGNESIUM) one tablet by mouth two times a day  Keep appt for any further refills. Prescriptions: NEXIUM 40 MG CPDR (ESOMEPRAZOLE MAGNESIUM) one tablet by mouth two times a day  Keep appt for any further refills.  #60 x 0   Entered by:   Christie Nottingham CMA (AAMA)   Authorized by:   Meryl Dare MD [facility]   Signed by:   Christie Nottingham CMA (AAMA) on 11/03/2009   Method used:   Electronically to        [facility]* (retail)       [address]       [place], [place]  [address]       Ph: [phone]       Fax: [phone]   RxID:   [id number]

## 2010-09-23 NOTE — Assessment & Plan Note (Signed)
Summary: YEARLY FU/ MEDIARE/ LABS SAME DAY/ COMING SAME DAY AS WIFE/NW...   Vital Signs:  Patient profile:   77 year old male Height:      68 inches Weight:      168 pounds BMI:     25.64 O2 Sat:      96 % on Room air Temp:     97.5 degrees F oral Pulse rate:   70 / minute BP sitting:   126 / 68  (left arm) Cuff size:   regular  Vitals Entered By: Bill Salinas CMA (July 27, 2009 9:08 AM)  O2 Flow:  Room air CC: yearly follow up / pt declined pneumonia shot/ ab   Primary Care Provider:  Illene Regulus, MD  CC:  yearly follow up / pt declined pneumonia shot/ ab.  History of Present Illness: Patinet has had fluid behind the TM with 90% hearing loss. He has seen ENT- had myringotomy tubes placed but they clogged. He had no restoration of hearing with initial placement. He had tried anti biotic and steroid drops to no avail. No imaging. He does seem to be a candidate for hearing aids at this point.   He had a cough productive of a lot of phlegm which gave him some stomach discomfort. He took allergy pills which helped.   He is otherwise doing ok with no complaints.   Current Medications (verified): 1)  Aspirin 81 Mg  Tabs (Aspirin) .... Take One Tablet Once Daily 2)  Lovastatin 40 Mg  Tabs (Lovastatin) .... Take One Tablet Once Daily 3)  Gabapentin 300 Mg  Caps (Gabapentin) .... Take One Tablet 3 Times Daily 4)  Tylenol .... Take As Needed 5)  Norvasc 10 Mg Tabs (Amlodipine Besylate) .... Take 1 Tablet By Mouth Once A Day 6)  Nexium 40 Mg Cpdr (Esomeprazole Magnesium) .... One Tablet By Mouth Two Times A Day 7)  Multivitamins  Tabs (Multiple Vitamin) .... Take 1 Tablet By Mouth Once A Day 8)  Ofloxacin 0.3 % Soln (Ofloxacin) .... 4 Drops Two Times A Day in Both Ears  Allergies (verified): 1)  ! Lipitor (Atorvastatin)  Past History:  Family History: Last updated: 07-14-2007 father-died at 94. CAD/MI mother-died 86. HTN, cancer-pancreatic 1 sister - arthritis, Lipids 1  brother - bone cancer secondary to prostate cancer No DM, colon cancer  Social History: Last updated: 05/16/2008 married 1950 1 son, 1 daughter-deceased at 5 months retired-fulltime. "honey-do's" Patient is a former smoker. Quit 25+ yrs ago Illicit Drug Use - no  Past Medical History: Hx of FX, ANGLE OF JAW, CLOSED (ICD-802.25) BARRETT'S ESOPHAGUS, HX OF (ICD-V12.79) GERD (ICD-530.81) PROSTATITIS, HX OF (ICD-V13.09) Hx of HEMORRHOIDS (ICD-455.6) HYPERLIPIDEMIA (ICD-272.4) HYPERTENSION (ICD-401.9)  von Willebrand's syndrome Gastritis Peptic Ulcer Disease Hearing Loss  Past Surgical History: Reviewed history from 2007/07/14 and no changes required. Inguinal herniorrhaphy-2001  Review of Systems       The patient complains of decreased hearing.  The patient denies anorexia, fever, weight loss, weight gain, vision loss, hoarseness, chest pain, syncope, dyspnea on exertion, peripheral edema, prolonged cough, hemoptysis, abdominal pain, incontinence, muscle weakness, transient blindness, difficulty walking, unusual weight change, enlarged lymph nodes, angioedema, and breast masses.    Physical Exam  General:  WNWD white male looking younger than his stated age.  Head:  normocephalic, atraumatic, and no abnormalities observed.   Eyes:  vision grossly intact, pupils equal, pupils round, corneas and lenses clear, and no injection.   Ears:  EACs clear; myringotomy tubes visualized  in the inferior quadrant both TMs, TM without erythema. Hearing is markedly decreased. Nose:  no external deformity and no external erythema.   Mouth:  edentulous with upper denture only. No oral lesions. Throat is clear Neck:  supple, full ROM, no thyromegaly, and no carotid bruits.   Chest Wall:  no deformities and no tenderness.   Lungs:  Normal respiratory effort, chest expands symmetrically. Lungs are clear to auscultation, no crackles or wheezes. Heart:  Normal rate and regular rhythm. S1 and S2  normal without gallop, murmur, click, rub or other extra sounds. Abdomen:  Bowel sounds positive,abdomen soft and non-tender without masses, organomegaly or hernias noted. Msk:  normal ROM, no joint tenderness, no joint swelling, no joint warmth, and no joint instability.   Pulses:  2+ radial and DP pulses Extremities:  No clubbing, cyanosis, edema, or deformity noted with normal full range of motion of all joints.   Neurologic:  alert & oriented X3 and cranial nerves II-XII intact except for marked hearing loss.  Gait normal, DTRs normal. Skin:  turgor normal, color normal, no suspicious lesions, and no ulcerations.   Cervical Nodes:  no anterior cervical adenopathy and no posterior cervical adenopathy.   Psych:  Oriented X3, memory intact for recent and remote, normally interactive, good eye contact, and not anxious appearing.     Impression & Recommendations:  Problem # 1:  DYSPHAGIA (ZOX-096.04) no complaints of swallowing problems  Problem # 2:  HYPERLIPIDEMIA (ICD-272.4) Due for routine lab with recommendation   His updated medication list for this problem includes:    Lovastatin 40 Mg Tabs (Lovastatin) .[name] Kitchen... Take one tablet once daily  Orders: TLB-Lipid Panel (80061-LIPID) TLB-Hepatic/Liver Function Pnl (80076-HEPATIC)  Problem # 3:  Hx of HEMORRHOIDS (ICD-455.6) He will use suppositoris as needed. He does not use stool softener or bulk laxation  Plan - recommend he use a bulk laxative, i.e. metamucil to keep risk of hemorrhoids down.  Problem # 4:  HYPERTENSION (ICD-401.9)  His updated medication list for this problem includes:    Norvasc 10 Mg Tabs (Amlodipine besylate) .[name] Kitchen... Take 1 tablet by mouth once a day  Orders: TLB-BMP (Basic Metabolic Panel-BMET) (80048-METABOL)  BP today: 126/68 Prior BP: 132/62 (02/24/2009)  Adequate control. Labs peninding.   Problem # 5:  HEARING LOSS, BILATERAL (ICD-389.9) Marked hearing loss.  Plan - per Dr. Lazarus Salines  Problem #  6:  Preventive Health Care (ICD-V70.0) Normal exam. current with colorectal cancer screening. For PSA today. He declines offer of pneumovax.  Lab results are good with well controlled cholesterol levels, normal blood sugar and normal PSA  In summary - a nice guy who is in good health except for hearing loss. He will return as needed.  Complete Medication List: 1)  Aspirin 81 Mg Tabs (Aspirin) .... Take one tablet once daily 2)  Lovastatin 40 Mg Tabs (Lovastatin) .... Take one tablet once daily 3)  Gabapentin 300 Mg Caps (Gabapentin) .... Take one tablet 3 times daily 4)  Tylenol  .... Take as needed 5)  Norvasc 10 Mg Tabs (Amlodipine besylate) .... Take 1 tablet by mouth once a day 6)  Nexium 40 Mg Cpdr (Esomeprazole magnesium) .... One tablet by mouth two times a day 7)  Multivitamins Tabs (Multiple vitamin) .... Take 1 tablet by mouth once a day 8)  Ofloxacin 0.3 % Soln (Ofloxacin) .... 4 drops two times a day in both ears  Other Orders: TLB-CBC Platelet - w/Differential (85025-CBCD) TLB-PTT (85730-PTTL) TLB-PSA (Prostate Specific Antigen) (84153-PSA)  Patient: [name] Note: All result statuses are Final unless otherwise noted.  Tests: (1) CBC Platelet w/Diff (CBCD)   White Cell Count          7.5 K/uL                    4.5-10.5   Red Cell Count            5.58 Mil/uL                 4.22-5.81   Hemoglobin                17.0 g/dL                   04.5-40.9   Hematocrit                49.6 %                      39.0-52.0   MCV                       88.8 fl                     78.0-100.0   MCHC                      34.3 g/dL                   81.1-91.4   RDW                       12.4 %                      11.5-14.6   Platelet Count            198.0 K/uL                  150.0-400.0   Neutrophil %              62.2 %                      43.0-77.0   Lymphocyte %              24.6 %                      12.0-46.0   Monocyte %                9.2 %                        3.0-12.0   Eosinophils%              3.2 %                       0.0-5.0   Basophils %               0.8 %                       0.0-3.0   Neutrophill Absolute      4.7 K/uL                    1.4-7.7   Lymphocyte Absolute  1.8 K/uL                    0.7-4.0   Monocyte Absolute         0.7 K/uL                    0.1-1.0  Eosinophils, Absolute                             0.2 K/uL                    0.0-0.7   Basophils Absolute        0.1 K/uL                    0.0-0.1  Tests: (2) PTT (PTTL)   Appt Time            [H]  29.4 SEC                    21.7-28.8  Tests: (3) Lipid Panel (LIPID)   Cholesterol               191 mg/dL                   0-272     ATP III Classification            Desirable:  < 200 mg/dL                    Borderline High:  200 - 239 mg/dL               High:  > = 240 mg/dL   Triglycerides        [H]  205.0 mg/dL                 5.3-664.4     Normal:  <150 mg/dL     Borderline High:  034 - 199 mg/dL   HDL                       74.25 mg/dL                 >95.63   VLDL Cholesterol     [H]  41.0 mg/dL                  8.7-56.4  CHO/HDL Ratio:  CHD Risk                             4                    Men          Women     1/2 Average Risk     3.4          3.3     Average Risk          5.0          4.4     2X Average Risk          9.6          7.1     3X Average Risk          15.0          11.0  Tests: (4) Hepatic/Liver Function Panel (HEPATIC)   Total Bilirubin           0.8 mg/dL                   1.6-1.0   Direct Bilirubin          0.1 mg/dL                   9.6-0.4   Alkaline Phosphatase      89 U/L                      39-117   AST                       21 U/L                      0-37   ALT                       20 U/L                      0-53   Total Protein             7.3 g/dL                    5.4-0.9   Albumin                   4.3 g/dL                    8.1-1.9  Tests: (5) BMP (METABOL)   Sodium                     141 mEq/L                   135-145   Potassium                 4.2 mEq/L                   3.5-5.1   Chloride                  103 mEq/L                   96-112   Carbon Dioxide            31 mEq/L                    19-32   Glucose                   94 mg/dL                    14-78   BUN                       15 mg/dL                    2-95   Creatinine                1.1 mg/dL                   6.2-1.3   Calcium  9.3 mg/dL                   5.2-84.1   GFR                       68.15 mL/min                >60  Tests: (6) Prostate Specific Antigen (PSA)   PSA-Hyb                   2.47 ng/mL                  0.10-4.00  Tests: (7) Cholesterol LDL - Direct (DIRLDL)  Cholesterol LDL - Direct                             105.9 mg/dLPrescriptions: LOVASTATIN 40 MG  TABS (LOVASTATIN) Take one tablet once daily  #30 x 12   Entered and Authorized by:   Jacques Navy MD   Signed by:   Jacques Navy MD on 07/27/2009   Method used:   Electronically to        [facility]* (retail)       [address]       [place], [place]  [address]       Ph: [phone]       Fax: [phone]   RxID:   [id number]

## 2010-09-23 NOTE — Progress Notes (Signed)
Summary: RESULTS  Phone Note Call from Patient Call back at Home Phone [phone]   Summary of Call: Patient is requesting results of cxr Initial call taken by: Lamar Sprinkles,  March 09, 2009 10:08 AM  Follow-up for Phone Call        No acute disease. Stable COPD Follow-up by: Jacques Navy MD,  March 09, 2009 1:12 PM  Additional Follow-up for Phone Call Additional follow up Details #1::        Pt informed  Additional Follow-up by: Lamar Sprinkles,  March 09, 2009 3:45 PM

## 2010-09-23 NOTE — Progress Notes (Signed)
  Phone Note Refill Request Message from:  Fax from Pharmacy  Refills Requested: Medication #1:  NORVASC 10 MG TABS Take 1 tablet by mouth once a day Initial call taken by: Zackery Barefoot CMA,  November 25, 2008 6:55 PM  Follow-up for Phone Call        Rx completed in Dr. Tiajuana Amass Follow-up by: Zackery Barefoot CMA,  November 25, 2008 6:55 PM      Prescriptions: NORVASC 10 MG TABS (AMLODIPINE BESYLATE) Take 1 tablet by mouth once a day  #30 x 6   Entered by:   Zackery Barefoot CMA   Authorized by:   Jacques Navy MD   Signed by:   Zackery Barefoot CMA on 11/25/2008   Method used:   Electronically to        [facility]* (retail)       [address]       [place], [place]  [address]       Ph: [phone]       Fax: [phone]   RxID:   [id number]

## 2010-09-23 NOTE — Assessment & Plan Note (Signed)
Summary: CPX-STC   Vital Signs:  Patient Profile:   77 Years Old Male Height:     68 inches Weight:      174 pounds Temp:     97.5 degrees F oral Pulse rate:   81 / minute BP sitting:   145 / 74  (left arm) Cuff size:   large  Vitals Entered By: Zackery Barefoot CMA (July 05, 2007 1:29 PM)                 PCP:  [facility]  Chief Complaint:  Preventive Care.  History of Present Illness: presents for general exam. Still with some lower abdominal pain. Finished cipro x 14 days, but still had some discomfort. Stopped cholesterol medicine but it really hasn't made a difference.  Current Allergies: No known allergies   Past Medical History:    Reviewed history from 05/15/2007 and no changes required:       Hx of FX, ANGLE OF JAW, CLOSED (ICD-802.25)       BARRETT'S ESOPHAGUS, HX OF (ICD-V12.79)       GERD (ICD-530.81)       PROSTATITIS, HX OF (ICD-V13.09)       Hx of HEMORRHOIDS (ICD-455.6)       HYPERLIPIDEMIA (ICD-272.4)       HYPERTENSION (ICD-401.9)          Past Surgical History:    Reviewed history from 06/18/2007 and no changes required:       Inguinal herniorrhaphy-2001   Family History:    Reviewed history from 06/18/2007 and no changes required:       father-died at 65. CAD/MI       mother-died 86. HTN, cancer-pancreatic       1 sister - arthritis, Lipids       1 brother - bone cancer secondary to prostate cancer       No DM, colon cancer  Social History:    Reviewed history from 06/18/2007 and no changes required:       married 1952       1 son, 1 daughter-deceased at 5 months       retired-fulltime. "honey-do's"   Risk Factors:  Colonoscopy History:     Date of Last Colonoscopy:  08/22/2004    Results:  done    Review of Systems  The patient denies anorexia, fever, weight loss, decreased hearing, chest pain, syncope, dyspnea on exhertion, peripheral edema, severe indigestion/heartburn, difficulty walking, abnormal bleeding, and testicular  masses.     Physical Exam  General:     Well-developed,well-nourished,in no acute distress; alert,appropriate and cooperative throughout examination Head:     Normocephalic and atraumatic without obvious abnormalities. No apparent alopecia or balding. Eyes:     No corneal or conjunctival inflammation noted. EOMI. Perrla. Funduscopic exam benign, without hemorrhages, exudates or papilledema. Vision grossly normal. Ears:     External ear exam shows no significant lesions or deformities.  Otoscopic examination reveals clear canals, tympanic membranes are intact bilaterally without bulging, retraction, inflammation or discharge. Hearing is grossly normal bilaterally. Mouth:     no gingival abnormalities, pharynx pink and moist, no posterior lymphoid hypertrophy, no pharyngeal crowing, and edentulous.   Neck:     No deformities, masses, or tenderness noted. Chest Wall:     No deformities, masses, tenderness or gynecomastia noted. Lungs:     Normal respiratory effort, chest expands symmetrically. Lungs are clear to auscultation, no crackles or wheezes. Heart:     Normal rate and regular rhythm.  S1 and S2 normal without gallop, murmur, click, rub or other extra sounds. Abdomen:     Bowel sounds positive,abdomen soft and non-tender without masses, organomegaly or hernias noted. Rectal:     no external abnormalities, no hemorrhoids, normal sphincter tone, and no masses.   Prostate:     no gland enlargement.  Firm, no nodules. Minimal tenderness Msk:     normal ROM, no joint tenderness, no joint swelling, and no joint warmth.   Pulses:     R and L carotid,radial,femoral,dorsalis pedis and posterior tibial pulses are full and equal bilaterally Extremities:     No clubbing, cyanosis, edema, or deformity noted with normal full range of motion of all joints.   Neurologic:     No cranial nerve deficits noted. Station and gait are normal. Plantar reflexes are down-going bilaterally. DTRs are  symmetrical throughout. Sensory, motor and coordinative functions appear intact. Skin:     two lipomas: left back, right scapular area.no rashes, no suspicious lesions, no petechiae, and no purpura.   Cervical Nodes:     No lymphadenopathy noted Axillary Nodes:     No palpable lymphadenopathy Psych:     Cognition and judgment appear intact. Alert and cooperative with normal attention span and concentration. No apparent delusions, illusions, hallucinations    Impression & Recommendations:  Problem # 1:  PROSTATITIS, HX OF (ICD-V13.09) still with symptoms but better.  Plan: second round cipro 500mg  two times a day x 14.  Problem # 2:  GERD (ICD-530.81)  His updated medication list for this problem includes:    Omeprazole 20 Mg Tbec (Omeprazole) .[name] Kitchen... Take 2 tablets twice daily  Stable Plan: continue present regimen   Problem # 3:  HYPERLIPIDEMIA (ICD-272.4)  His updated medication list for this problem includes:    Lovastatin 40 Mg Tabs (Lovastatin) .[name] Kitchen... Take one tablet once daily  Labs Reviewed: Chol: 176 (06/26/2007)   HDL: 40.4 (06/26/2007)   LDL: 100 (06/26/2007)   TG: 177 (06/26/2007) SGOT: 26 (06/26/2007)   SGPT: 26 (06/26/2007)  Plan: continue medications.   Problem # 4:  HYPERTENSION (ICD-401.9)  The following medications were removed from the medication list:    Hydrochlorothiazide 25 Mg Tabs (Hydrochlorothiazide) .[name] Kitchen... Take 1 tablet by mouth once a day  His updated medication list for this problem includes:    Norvasc 10 Mg Tabs (Amlodipine besylate) .[name] Kitchen... Take 1 tablet by mouth once a day  BP today: 145/74 Prior BP: 158/87 (06/18/2007)  Plan continue present medications.  Labs Reviewed: Creat: 1.1 (06/26/2007) Chol: 176 (06/26/2007)   HDL: 40.4 (06/26/2007)   LDL: 100 (06/26/2007)   TG: 177 (06/26/2007)   Problem # 5:  Preventive Health Care (ICD-V70.0) Normal prostate exam. PSA 2.09-normal range and lower than the last test. Up to date on  colorectal cancer screening.  Complete Medication List: 1)  Aspirin 81 Mg Tabs (Aspirin) .... Take one tablet once daily 2)  Omeprazole 20 Mg Tbec (Omeprazole) .... Take 2 tablets twice daily 3)  Lovastatin 40 Mg Tabs (Lovastatin) .... Take one tablet once daily 4)  Gabapentin 300 Mg Caps (Gabapentin) .... Take one tablet 3 times daily 5)  Tylenol  .... Take as needed 6)  Norvasc 10 Mg Tabs (Amlodipine besylate) .... Take 1 tablet by mouth once a day     ]  Preventive Care Screening  Colonoscopy:    Date:  08/22/2004    Results:  done

## 2010-09-23 NOTE — Assessment & Plan Note (Signed)
Summary: FLU [facility] Sherlean Foot Natale Milch   Nurse Visit   Vital Signs:  Patient profile:   77 year old male Temp:     97.3 degrees F oral  Vitals Entered By: Lanier Prude, [facility]) (May 20, 2010 2:41 PM)  Allergies: 1)  ! Lipitor (Atorvastatin)  Orders Added: 1)  Flu Vaccine 40yrs + MEDICARE PATIENTS [Q2039] 2)  Administration Flu vaccine - MCR [G0008] .lbmedflu   Flu Vaccine Consent Questions     Do you have a history of severe allergic reactions to this vaccine? no    Any prior history of allergic reactions to egg and/or gelatin? no    Do you have a sensitivity to the preservative Thimersol? no    Do you have a past history of Guillan-Barre Syndrome? no    Do you currently have an acute febrile illness? no    Have you ever had a severe reaction to latex? no    Vaccine information given and explained to patient? yes    Are you currently pregnant? no    Lot Number:[id number]   Exp Date:02/19/2011   Site Given  Left Deltoid IM Lanier Prude, [facility])  May 20, 2010 2:41 PM

## 2010-09-23 NOTE — Letter (Signed)
Summary: [facility]  [facility]   Imported By: Sherian Rein 10/10/2009 10:13:17  _____________________________________________________________________  External Attachment:    Type:   Image     Comment:   External Document

## 2010-09-23 NOTE — Miscellaneous (Signed)
Summary: Nexium refill  Clinical Lists Changes  Medications: Changed medication from OMEPRAZOLE 40 MG  CPDR (OMEPRAZOLE) Take 1 tablet by mouth two times a day 30 minutes before a meal. to NEXIUM 40 MG CPDR (ESOMEPRAZOLE MAGNESIUM) one tablet by mouth two times a day - Signed Rx of NEXIUM 40 MG CPDR (ESOMEPRAZOLE MAGNESIUM) one tablet by mouth two times a day;  #60 x 11;  Signed;  Entered by: Christie Nottingham CMA;  Authorized by: Meryl Dare MD [facility];  Method used: Electronically to [facility] [address], [place], [place]  [address], Ph: [phone], Fax: [phone]    Prescriptions: NEXIUM 40 MG CPDR (ESOMEPRAZOLE MAGNESIUM) one tablet by mouth two times a day  #60 x 11   Entered by:   Christie Nottingham CMA   Authorized by:   Meryl Dare MD [facility]   Signed by:   Christie Nottingham CMA on 08/29/2008   Method used:   Electronically to        [facility]* (retail)       [address]       [place], [place]  [address]       Ph: [phone]       Fax: [phone]   RxID:   [id number]

## 2010-09-23 NOTE — Assessment & Plan Note (Signed)
Summary: YEARLY-LABS WILL BE DONE DAY AFTER YEARLY--STC   Vital Signs:  Patient profile:   77 year old male Height:      68 inches Weight:      166 pounds BMI:     25.33 O2 Sat:      95 % on Room air Temp:     97.1 degrees F oral Pulse rate:   82 / minute BP sitting:   132 / 64  (left arm) Cuff size:   regular  Vitals Entered By: Bill Salinas CMA (July 29, 2010 1:17 PM)  O2 Flow:  Room air  Primary Care Provider:  Illene Regulus, MD   History of Present Illness: Patient presents for general medical follow-up and preventive care.   He does c/o sore for several . No dysphagia. No fever, no chills. He also has a sore neck but not tender to touch. He has no limitations in activity. He does get relief with APAP. He has crepitis with neck rolls.  He does have some low back pain that is a chronic problem. It is not dependent on activity but it is positional. This is really not any worse.  He has severe hearing loss. ENT without explanation. He is doing  better with hearing aids.  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   He remains independent in ADLs. He is not depressed although he is a bit irritible. He hasn't had any falls. He is cognitively intact managing all his activities and finances and chores and keeping up with his calendar.   Preventive Screening-Counseling & Management  Alcohol-Tobacco     Alcohol drinks/day: 0     Smoking Status: quit     Year Started: 1985  Caffeine-Diet-Exercise     Caffeine use/day: 1 cup per day     Diet Comments: regular     Diet Counseling: not indicated; diet is assessed to be healthy     Does Patient Exercise: no  Hep-HIV-STD-Contraception     Hepatitis Risk: no risk noted     HIV Risk: no risk noted     STD Risk: no risk noted     Dental Visit-last 6 months no     TSE monthly: no     Sun Exposure-Excessive: no  Safety-Violence-Falls     Seat Belt Use: yes     Helmet Use: n/a  Firearms in the Home: firearms in the home     Smoke Detectors: yes     Violence in the Home: no risk noted     Sexual Abuse: no     Fall Risk: slight fall risk      Sexual History:  currently monogamous.        Drug Use:  never.        Blood Transfusions:  no.    Current Medications (verified): 1)  Aspirin 81 Mg  Tabs (Aspirin) .... Take One Tablet Once Daily 2)  Lovastatin 40 Mg  Tabs (Lovastatin) .... Take One Tablet Once Daily 3)  Gabapentin 300 Mg  Caps (Gabapentin) .... Take One Tablet 3 Times Daily 4)  Tylenol .... Take As Needed 5)  Norvasc 10 Mg Tabs (Amlodipine Besylate) .... Take 1 Tablet By Mouth Once A Day 6)  Nexium 40 Mg Cpdr (Esomeprazole Magnesium) .... One Tablet By Mouth Two Times A Day 7)  Multivitamins  Tabs (Multiple Vitamin) .... Take 1 Tablet By Mouth Once A Day 8)  Avastin 100 Mg/6ml Soln (Bevacizumab) .... Right  Eye 9)  Ocuvite Adult 50+  Caps (Multiple Vitamins-Minerals) .[name] Kitchen.. 1 Capsule Daily 10)  Tylenol 325 Mg Tabs (Acetaminophen) .... As Needed  Allergies (verified): 1)  ! Lipitor (Atorvastatin)  Past History:  Past Medical History: Last updated: 11/30/2009 Hx of FX, ANGLE OF JAW, CLOSED (ICD-802.25) BARRETT'S ESOPHAGUS, HX OF (ICD-V12.79) GERD (ICD-530.81) PROSTATITIS, HX OF (ICD-V13.09) Hx of HEMORRHOIDS (ICD-455.6) HYPERLIPIDEMIA (ICD-272.4) HYPERTENSION (ICD-401.9) von Willebrand's syndrome Gastritis Peptic Ulcer Disease Hearing Loss Cecal AVM Diverticulosis  Past Surgical History: Last updated: Jun 20, 2007 Inguinal herniorrhaphy-2001  Family History: Last updated: 06/20/07 father-died at 17. CAD/MI mother-died 86. HTN, cancer-pancreatic 1 sister - arthritis, Lipids 1 brother - bone cancer secondary to prostate cancer No DM, colon cancer  Social History: Last updated: 05/16/2008 married 1950 1 son, 1 daughter-deceased at 5 months retired-fulltime. "honey-do's" Patient is a former smoker. Quit 25+ yrs ago Illicit Drug Use  - no  Social History: Caffeine use/day:  1 cup per day Dental Care w/in 6 mos.:  no Sun Exposure-Excessive:  no Seat Belt Use:  yes Fall Risk:  slight fall risk Hepatitis Risk:  no risk noted HIV Risk:  no risk noted STD Risk:  no risk noted Sexual History:  currently monogamous Drug Use:  never Blood Transfusions:  no  Review of Systems  The patient denies anorexia, fever, weight loss, weight gain, decreased hearing, chest pain, syncope, dyspnea on exertion, prolonged cough, abdominal pain, severe indigestion/heartburn, muscle weakness, difficulty walking, depression, enlarged lymph nodes, and angioedema.    Physical Exam  General:  WNWD elderly white male with hearing aids in place Head:  normocephalic, atraumatic, and no abnormalities observed.   Eyes:  pupils equal and pupils round.  Furhter exam deferred to Opthalmology (Dr.Saunders). Ears:  hearing aid left ear. EACs clear,TMs normal Nose:  no external deformity and no external erythema.   Mouth:  complex partial dentures upper and lower. No buccal lesions. Throat clear Neck:  supple, full ROM, no thyromegaly, and no carotid bruits.   Chest Wall:  No deformities, masses, tenderness or gynecomastia noted. Lungs:  normal respiratory effort, normal breath sounds, no crackles, and no wheezes.   Heart:  normal rate, regular rhythm, no murmur, no rub, and no JVD.   Abdomen:  soft, normal bowel sounds, no guarding, and no hepatomegaly.   Prostate:  deferred Msk:  normal ROM, no joint warmth, no joint deformities, and no joint instability.   Pulses:  2+ radial pulse Extremities:  No clubbing, cyanosis, edema, or deformity noted with normal full range of motion of all joints.   Neurologic:  alert & oriented X3 and cranial nerves II-XII intact x/ VIII with hearing loss,  gait normal, DTRs normal.   Skin:  turgor normal, color normal, no suspicious lesions, no ecchymoses, and no ulcerations.   Cervical Nodes:  no anterior cervical  adenopathy and no posterior cervical adenopathy.   Inguinal Nodes:  no R inguinal adenopathy and no L inguinal adenopathy.   Psych:  Oriented X3, memory intact for recent and remote, normally interactive, good eye contact, and not anxious appearing.     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 CBC is normal  Problem # 3:  HYPERLIPIDEMIA (ICD-272.4) Due for lipid labs with recommendations to follow.   His updated medication list for this problem includes:    Lipitor 40 Mg Tabs (Atorvastatin calcium) .[name] KitchenMarland KitchenMarland KitchenMarland Kitchen 1  by mouth qpm for cholesterol control  Orders: TLB-Lipid Panel (80061-LIPID) TLB-Hepatic/Liver Function Pnl (80076-HEPATIC) TLB-TSH (Thyroid Stimulating Hormone) (84443-TSH)  Addendum - poor control with LDL 148.8  Plan - change medication to atorvastatin 40mg            follow up lab 4 weeks after starting new medication  Problem # 4:  HYPERTENSION (ICD-401.9)  His updated medication list for this problem includes:    Norvasc 10 Mg Tabs (Amlodipine besylate) .[name] Kitchen... Take 1 tablet by mouth once a day  Orders: TLB-BMP (Basic Metabolic Panel-BMET) (80048-METABOL)  BP today: 132/64 Prior BP: 130/68 (12/17/2009)  Good control on present medication.  Problem # 5:  Preventive Health Care (ICD-V70.0)  Interval history with no new medical problems. Physical exam was unremarkable. Lab results in normal range except for cholesterol panel. Patient is current with colorectal cancer screening with colonoscopy March '10. Immunizations: Tetnus and Pneumovax today; Flu Sept '11. 12 Lead EKG without signs of ischemia.  In summary - a nice man with a pointed sense of humor who appears to be medically stable. He is counseled to continue all his present medications and start the new cholesterol medication. End of life care preferences  deferred today and will be discussed at next vist. He is asked to return in 21 year or prn.   Orders: Medicare -1st Annual Wellness Visit [phone])  Complete Medication List: 1)  Aspirin 81 Mg Tabs (Aspirin) .... Take one tablet once daily 2)  Lipitor 40 Mg Tabs (Atorvastatin calcium) .[name] Kitchen.. 1 by mouth qpm for cholesterol control 3)  Gabapentin 300 Mg Caps (Gabapentin) .... Take one tablet 3 times daily 4)  Tylenol  .... Take as needed 5)  Norvasc 10 Mg Tabs (Amlodipine besylate) .... Take 1 tablet by mouth once a day 6)  Nexium 40 Mg Cpdr (Esomeprazole magnesium) .... One tablet by mouth two times a day 7)  Multivitamins Tabs (Multiple vitamin) .... Take 1 tablet by mouth once a day 8)  Avastin 100 Mg/53ml Soln (Bevacizumab) .... Right eye 9)  Ocuvite Adult 50+ Caps (Multiple vitamins-minerals) .[name] Kitchen.. 1 capsule daily 10)  Tylenol 325 Mg Tabs (Acetaminophen) .... As needed  Other Orders: TLB-CBC Platelet - w/Differential (85025-CBCD) Tdap => 63yrs IM (78469) Pneumococcal Vaccine (62952) Admin 1st Vaccine (84132) Admin of Any Addtl Vaccine (44010) EKG w/ Interpretation (93000)   Patient: [name] Note: All result statuses are Final unless otherwise noted.  Tests: (1) Lipid Panel (LIPID)   Cholesterol          [H]  238 mg/dL                   2-725     ATP III Classification            Desirable:  < 200 mg/dL                    Borderline High:  200 - 239 mg/dL               High:  > = 240 mg/dL   Triglycerides             134.0 mg/dL                 3.6-644.0     Normal:  <150 mg/dL     Borderline High:  347 - 199 mg/dL   HDL  45.00 mg/dL                 >65.78   VLDL Cholesterol          26.8 mg/dL                  4.6-96.2  CHO/HDL Ratio:  CHD Risk                             5                    Men          Women     1/2 Average Risk     3.4          3.3     Average Risk          5.0          4.4     2X Average Risk          9.6          7.1     3X  Average Risk          15.0          11.0                           Tests: (2) Hepatic/Liver Function Panel (HEPATIC)   Total Bilirubin           0.6 mg/dL                   9.5-2.8   Direct Bilirubin          0.1 mg/dL                   4.1-3.2   Alkaline Phosphatase      75 U/L                      39-117   AST                       22 U/L                      0-37   ALT                       17 U/L                      0-53   Total Protein             7.0 g/dL                    4.4-0.1   Albumin                   4.1 g/dL                    0.2-7.2  Tests: (3) BMP (METABOL)   Sodium                    140 mEq/L                   135-145   Potassium  4.3 mEq/L                   3.5-5.1   Chloride                  103 mEq/L                   96-112   Carbon Dioxide            29 mEq/L                    19-32   Glucose              [H]  101 mg/dL                   16-10   BUN                       17 mg/dL                    9-60   Creatinine                1.0 mg/dL                   4.5-4.0   Calcium                   9.3 mg/dL                   9.8-11.9   GFR                       72.53 mL/min                >60.00  Tests: (4) CBC Platelet w/Diff (CBCD)   White Cell Count     [H]  12.7 K/uL                   4.5-10.5   Red Cell Count            5.13 Mil/uL                 4.22-5.81   Hemoglobin                15.3 g/dL                   14.7-82.9   Hematocrit                44.9 %                      39.0-52.0   MCV                       87.6 fl                     78.0-100.0   MCHC                      34.0 g/dL                   56.2-13.0   RDW                       14.0 %  11.5-14.6   Platelet Count            237.0 K/uL                  150.0-400.0   Neutrophil %         [H]  80.8 %                      43.0-77.0   Lymphocyte %         [L]  10.4 %                      12.0-46.0   Monocyte %                8.0 %                        3.0-12.0   Eosinophils%              0.6 %                       0.0-5.0   Basophils %               0.2 %                       0.0-3.0   Neutrophill Absolute [H]  10.2 K/uL                   1.4-7.7   Lymphocyte Absolute       1.3 K/uL                    0.7-4.0   Monocyte Absolute         1.0 K/uL                    0.1-1.0  Eosinophils, Absolute                             0.1 K/uL                    0.0-0.7   Basophils Absolute        0.0 K/uL                    0.0-0.1  Tests: (5) TSH (TSH)   FastTSH                   2.36 uIU/mL                 0.35-5.50  Tests: (6) Cholesterol LDL - Direct (DIRLDL)  Cholesterol LDL - Direct                             148.8 mg/dL     Optimal:  <784 mg/dL     Near or Above Optimal:  100-129 mg/dL     Borderline High:  696-295 mg/dL     High:  284-132 mg/dL     Very High:  >440 mg/dLPrescriptions: LIPITOR 40 MG TABS (ATORVASTATIN CALCIUM) 1 by mouth qPM for cholesterol control  #30 x 12   Entered and Authorized by:   Jacques Navy MD   Signed by:   Jacques Navy MD on 07/30/2010   Method used:  Electronically to        [facility]* (retail)       [address]       [place], [place]  [address]       Ph: [phone]       Fax: [phone]   RxID:   [id number] NORVASC 10 MG TABS (AMLODIPINE BESYLATE) Take 1 tablet by mouth once a day  #30 x 12   Entered and Authorized by:   Jacques Navy MD   Signed by:   Jacques Navy MD on 07/29/2010   Method used:   Electronically to        [facility]* (retail)       [address]       [place], [place]  [address]       Ph: [phone]       Fax: [phone]   RxID:   [id number] GABAPENTIN 300 MG  CAPS (GABAPENTIN) Take one tablet 3 times daily  #90 x 6   Entered and Authorized by:   Jacques Navy MD   Signed by:   Jacques Navy MD on 07/29/2010   Method used:   Electronically to        [facility]* (retail)       [address]       [place], [place]   [address]       Ph: [phone]       Fax: [phone]   RxID:   [id number]    Orders Added: 1)  TLB-Lipid Panel [80061-LIPID] 2)  TLB-Hepatic/Liver Function Pnl [80076-HEPATIC] 3)  TLB-BMP (Basic Metabolic Panel-BMET) [80048-METABOL] 4)  TLB-CBC Platelet - w/Differential [85025-CBCD] 5)  TLB-TSH (Thyroid Stimulating Hormone) [84443-TSH] 6)  Tdap => 2yrs IM [90715] 7)  Pneumococcal Vaccine [90732] 8)  Admin 1st Vaccine [90471] 9)  Admin of Any Addtl Vaccine [90472] 10)  Medicare -1st Annual Wellness Visit [G0438] 11)  Est. Patient Level IV [93716] 12)  EKG w/ Interpretation [93000]   Immunizations Administered:  Tetanus Vaccine:    Vaccine Type: Tdap    Site: left deltoid    Mfr: GlaxoSmithKline    Dose: 0.5 ml    Route: IM    Given by: Ami Bullins CMA    Exp. Date: 06/10/2012    Lot #: [id number]    VIS given: 07/09/08 version given July 30, 2010.  Pneumonia Vaccine:    Vaccine Type: Pneumovax    Site: right deltoid    Mfr: Merck    Dose: 0.5 ml    Route: IM    Given by: Ami Bullins CMA    Exp. Date: 12/30/2011    Lot #: [id number]    VIS given: 07/27/09 version given July 30, 2010.   Immunizations Administered:  Tetanus Vaccine:    Vaccine Type: Tdap    Site: left deltoid    Mfr: GlaxoSmithKline    Dose: 0.5 ml    Route: IM    Given by: Ami Bullins CMA    Exp. Date: 06/10/2012    Lot #: [id number]    VIS given: 07/09/08 version given July 30, 2010.  Pneumonia Vaccine:    Vaccine Type: Pneumovax    Site: right deltoid    Mfr: Merck    Dose: 0.5 ml    Route: IM    Given by: Ami Bullins CMA    Exp. Date: 12/30/2011    Lot #: [id number]    VIS given: 07/27/09 version given July 30, 2010.

## 2010-09-23 NOTE — Letter (Signed)
Summary: [facility]  [facility]   Imported By: Sherian Rein 01/07/2010 14:50:18  _____________________________________________________________________  External Attachment:    Type:   Image     Comment:   External Document

## 2010-09-23 NOTE — Miscellaneous (Signed)
Summary: omeprazole rx  Clinical Lists Changes  Medications: Added new medication of OMEPRAZOLE 40 MG  CPDR (OMEPRAZOLE) Take 1 tablet by mouth two times a day 30 minutes before a meal. PLEASE MAKE APPT FOR FURTHER REFILLS!!! - Signed Rx of OMEPRAZOLE 40 MG  CPDR (OMEPRAZOLE) Take 1 tablet by mouth two times a day 30 minutes before a meal. PLEASE MAKE APPT FOR FURTHER REFILLS!!!;  #60 x 0;  Signed;  Entered by: Hortense Ramal CMA;  Authorized by: Meryl Dare MD FACG;  Method used: Electronic    Prescriptions: OMEPRAZOLE 40 MG  CPDR (OMEPRAZOLE) Take 1 tablet by mouth two times a day 30 minutes before a meal. PLEASE MAKE APPT FOR FURTHER REFILLS!!!  #60 x 0   Entered by:   Hortense Ramal CMA   Authorized by:   Meryl Dare MD [facility]   Signed by:   Hortense Ramal CMA on 03/27/2008   Method used:   Electronically sent to ...       [facility]*       [address]       [place], [place]  [address]       Ph: [phone] or [phone]       Fax: [phone]   RxID:   [id number]

## 2010-09-23 NOTE — Assessment & Plan Note (Signed)
Summary: flu/men/cd   Nurse Visit   Allergies: 1)  ! Lipitor (Atorvastatin)  Orders Added: 1)  Flu Vaccine 83yrs + [90658] 2)  Administration Flu vaccine - MCR [G0008]    Flu Vaccine Consent Questions     Do you have a history of severe allergic reactions to this vaccine? no    Any prior history of allergic reactions to egg and/or gelatin? no    Do you have a sensitivity to the preservative Thimersol? no    Do you have a past history of Guillan-Barre Syndrome? no    Do you currently have an acute febrile illness? no    Have you ever had a severe reaction to latex? no    Vaccine information given and explained to patient? yes    Are you currently pregnant? no    Lot Number:[id number]   Exp Date:02/18/2010   Site Given  Left Deltoid IMu

## 2010-09-23 NOTE — Assessment & Plan Note (Signed)
Summary: EAR ACHE-SORE JAW BONE-STC   Vital Signs:  Patient Profile:   77 Years Old Male Height:     68 inches Weight:      176 pounds Temp:     97.4 degrees F oral Pulse rate:   80 / minute BP sitting:   138 / 78  (left arm) Cuff size:   regular  Vitals Entered By: Zackery Barefoot CMA (October 16, 2007 2:36 PM)                 PCP:  [facility]ins  Chief Complaint:  LEFT EARACHE/SORE THROAT/PRODUCTIVE COUGH X WEEKS.  History of Present Illness: Two weeks of pain in the submandibular region, sore with chewing. Has some pain in the left ear. No drainage. No fever.  Has a morning cough with production of phlegm. this seems to relieve some of the pain.     Current Allergies: No known allergies       Physical Exam  General:     Well-developed,well-nourished,in no acute distress; alert,appropriate and cooperative throughout examination Ears:     External ear exam shows no significant lesions or deformities.  Otoscopic examination reveals clear canals, tympanic membranes are intact bilaterally without bulging, retraction, inflammation or discharge. Hearing is grossly normal bilaterally. Mouth:     edentulous.  crepitus worse over the left TMJ along with tenderness.    Impression & Recommendations:  Problem # 1:  TEMPOROMANDIBULAR JOINT DISORDER (ICD-524.60) patient by history and exam with TMJ.  Plan: otc NSAIDs, aspercreme, small bites. "Care Notes" handout provided. If it gets worse may need to see oral surgeon.  Complete Medication List: 1)  Aspirin 81 Mg Tabs (Aspirin) .... Take one tablet once daily 2)  Omeprazole 20 Mg Tbec (Omeprazole) .... Take 2 tablets twice daily 3)  Lovastatin 40 Mg Tabs (Lovastatin) .... Take one tablet once daily 4)  Gabapentin 300 Mg Caps (Gabapentin) .... Take one tablet 3 times daily 5)  Tylenol  .... Take as needed 6)  Norvasc 10 Mg Tabs (Amlodipine besylate) .... Take 1 tablet by mouth once a day     ]

## 2010-09-23 NOTE — Assessment & Plan Note (Signed)
Summary: FLU SHOT/MEN/NML  Nurse Visit    Prior Medications: ASPIRIN 81 MG  TABS (ASPIRIN) Take one tablet once daily OMEPRAZOLE 20 MG  TBEC (OMEPRAZOLE) Take 2 tablets twice daily LOVASTATIN 40 MG  TABS (LOVASTATIN) Take one tablet once daily GABAPENTIN 300 MG  CAPS (GABAPENTIN) Take one tablet 3 times daily TYLENOL () Take as needed NORVASC 10 MG TABS (AMLODIPINE BESYLATE) Take 1 tablet by mouth once a day Current Allergies: No known allergies    Influenza Vaccine    Vaccine Type: Fluvax MCR    Site: left deltoid    Mfr: Sanofi Pasteur    Dose: 0.5 ml    Route: IM    Given by: Zackery Barefoot CMA    Exp. Date: 02/19/2008    Lot #: [id number]    VIS given: 03/15/07 version given July 31, 2007.   Orders Added: 1)  Influenza Vaccine MCR [00025]    ]

## 2010-09-23 NOTE — Miscellaneous (Signed)
Summary: [facility] PREVISIT/PREP  Clinical Lists Changes  Medications: Added new medication of MOVIPREP 100 GM  SOLR (PEG-KCL-NACL-NASULF-NA ASC-C) TAKE AS DIRECTED - Signed Rx of MOVIPREP 100 GM  SOLR (PEG-KCL-NACL-NASULF-NA ASC-C) TAKE AS DIRECTED;  #1 x 0;  Signed;  Entered by: Kyra Searles RN II;  Authorized by: Meryl Dare MD [facility];  Method used: Electronically to [facility]*, [address], [place], [place]  [address], Ph: [phone], Fax: [phone] Observations: Added new observation of NKA: T (10/15/2008 10:15)    Prescriptions: MOVIPREP 100 GM  SOLR (PEG-KCL-NACL-NASULF-NA ASC-C) TAKE AS DIRECTED  #1 x 0   Entered by:   Kyra Searles RN II   Authorized by:   Meryl Dare MD [facility]   Signed by:   Kyra Searles RN II on 10/15/2008   Method used:   Electronically to        [facility]* (retail)       [address]       [place], [place]  [address]       Ph: [phone]       Fax: [phone]   RxID:   [id number]

## 2010-09-23 NOTE — Assessment & Plan Note (Signed)
Summary: flu shot.men.cd   Nurse Visit   Vital Signs:  Patient Profile:   77 Years Old Male Height:     68 inches Temp:     96.5 degrees F oral  Vitals Entered By: Windell Norfolk (June 04, 2008 10:16 AM)                 Prior Medications: ASPIRIN 81 MG  TABS (ASPIRIN) Take one tablet once daily LOVASTATIN 40 MG  TABS (LOVASTATIN) Take one tablet once daily GABAPENTIN 300 MG  CAPS (GABAPENTIN) Take one tablet 3 times daily TYLENOL () Take as needed NORVASC 10 MG TABS (AMLODIPINE BESYLATE) Take 1 tablet by mouth once a day OMEPRAZOLE 40 MG  CPDR (OMEPRAZOLE) Take 1 tablet by mouth two times a day 30 minutes before a meal. ALREX 0.2 % SUSP (LOTEPREDNOL ETABONATE) 2 drops once daily as needed MULTIVITAMINS  TABS (MULTIPLE VITAMIN) Take 1 tablet by mouth once a day Current Allergies: No known allergies     Orders Added: 1)  Flu Vaccine 66yrs + [16109] 2)  Administration Flu vaccine [G0008]   Flu Vaccine Consent Questions     Do you have a history of severe allergic reactions to this vaccine? no    Any prior history of allergic reactions to egg and/or gelatin? no    Do you have a sensitivity to the preservative Thimersol? no    Do you have a past history of Guillan-Barre Syndrome? no    Do you currently have an acute febrile illness? no    Have you ever had a severe reaction to latex? no    Vaccine information given and explained to patient? yes    Are you currently pregnant? no    Lot Number:[id number]   Site Given  right Deltoid IM] .medflu

## 2010-09-23 NOTE — Progress Notes (Signed)
  Phone Note Refill Request Message from:  Fax from Pharmacy on August 04, 2010 2:01 PM  Refills Requested: Medication #1:  GABAPENTIN 300 MG  CAPS Take one tablet 3 times daily refill request from [facility], please advise  Initial call taken by: Ami Bullins CMA,  August 04, 2010 2:01 PM  Follow-up for Phone Call        ok to refill as needed  Follow-up by: Jacques Navy MD,  August 04, 2010 4:59 PM    Prescriptions: GABAPENTIN 300 MG  CAPS (GABAPENTIN) Take one tablet 3 times daily  #90 x 1   Entered by:   Ami Bullins CMA   Authorized by:   Jacques Navy MD   Signed by:   Bill Salinas CMA on 08/05/2010   Method used:   Electronically to        [facility]* (retail)       [address]       [place], [place]  [address]       Ph: [phone]       Fax: [phone]   RxID:   [id number]

## 2010-09-23 NOTE — Progress Notes (Signed)
Summary: Samples  Phone Note Call from Patient Call back at Home Phone [phone]   Caller: Patient Call For: Jacques Navy MD Summary of Call: Patient is in the office for a flu shot. He has gone into the donut hole and was wondering if he can get Nexium samples. Initial call taken by: Irma Newness,  June 03, 2009 9:42 AM  Follow-up for Phone Call        pt was given Nexium samples Follow-up by: Ami Bullins CMA,  June 03, 2009 9:53 AM

## 2010-09-23 NOTE — Progress Notes (Signed)
Summary: Refill--Norvasc  Phone Note Refill Request Message from:  Fax from Pharmacy on January 22, 2010 11:57 AM  Refills Requested: Medication #1:  NORVASC 10 MG TABS Take 1 tablet by mouth once a day Initial call taken by: Lucious Groves,  January 22, 2010 11:57 AM    Prescriptions: NORVASC 10 MG TABS (AMLODIPINE BESYLATE) Take 1 tablet by mouth once a day  #30 x 6   Entered by:   Lucious Groves   Authorized by:   Jacques Navy MD   Signed by:   Lucious Groves on 01/22/2010   Method used:   Electronically to        [facility]* (retail)       [address]       [place], [place]  [address]       Ph: [phone]       Fax: [phone]   RxID:   [id number]

## 2010-09-23 NOTE — Letter (Signed)
Summary: [facility]   [facility]   Imported By: Sherian Rein 07/29/2009 09:11:40  _____________________________________________________________________  External Attachment:    Type:   Image     Comment:   External Document

## 2010-09-23 NOTE — Assessment & Plan Note (Signed)
Summary: Yearly CPX / [employer] / $50 / cd   Vital Signs:  Patient Profile:   77 Years Old Male Height:     68 inches Weight:      174 pounds Temp:     97.5 degrees F oral Pulse rate:   64 / minute BP sitting:   126 / 70  (left arm) Cuff size:   regular  Vitals Entered By: Zackery Barefoot CMA (July 07, 2008 2:41 PM)                 PCP:  Illene Regulus, MD  Chief Complaint:  Annual visit for disease management.  History of Present Illness: Patient for annual exam. Feels well. He has some trouble with his throat which he treats with chloraseptic spray. He has seen Dr. Russella Dar September '09 and is scheduled for EGD in March '10 for Barrett's surveillance.     Prior Medications Reviewed Using: Patient Recall  Prior Medication List:  ASPIRIN 81 MG  TABS (ASPIRIN) Take one tablet once daily LOVASTATIN 40 MG  TABS (LOVASTATIN) Take one tablet once daily GABAPENTIN 300 MG  CAPS (GABAPENTIN) Take one tablet 3 times daily * TYLENOL Take as needed NORVASC 10 MG TABS (AMLODIPINE BESYLATE) Take 1 tablet by mouth once a day OMEPRAZOLE 40 MG  CPDR (OMEPRAZOLE) Take 1 tablet by mouth two times a day 30 minutes before a meal. ALREX 0.2 % SUSP (LOTEPREDNOL ETABONATE) 2 drops once daily as needed MULTIVITAMINS  TABS (MULTIPLE VITAMIN) Take 1 tablet by mouth once a day   Updated Prior Medication List: ASPIRIN 81 MG  TABS (ASPIRIN) Take one tablet once daily LOVASTATIN 40 MG  TABS (LOVASTATIN) Take one tablet once daily GABAPENTIN 300 MG  CAPS (GABAPENTIN) Take one tablet 3 times daily * TYLENOL Take as needed NORVASC 10 MG TABS (AMLODIPINE BESYLATE) Take 1 tablet by mouth once a day OMEPRAZOLE 40 MG  CPDR (OMEPRAZOLE) Take 1 tablet by mouth two times a day 30 minutes before a meal. ALREX 0.2 % SUSP (LOTEPREDNOL ETABONATE) 2 drops once daily as needed MULTIVITAMINS  TABS (MULTIPLE VITAMIN) Take 1 tablet by mouth once a day  Current Allergies (reviewed today): No known  allergies   Past Medical History:    Reviewed history from 05/14/2008 and no changes required:       Hx of FX, ANGLE OF JAW, CLOSED (ICD-802.25)       BARRETT'S ESOPHAGUS, HX OF (ICD-V12.79)       GERD (ICD-530.81)       PROSTATITIS, HX OF (ICD-V13.09)       Hx of HEMORRHOIDS (ICD-455.6)       HYPERLIPIDEMIA (ICD-272.4)       HYPERTENSION (ICD-401.9)        von Willebrand's syndrome       Gastritis       Peptic Ulcer Disease  Past Surgical History:    Reviewed history from 06/18/2007 and no changes required:       Inguinal herniorrhaphy-2001   Family History:    Reviewed history from 06/18/2007 and no changes required:       father-died at 78. CAD/MI       mother-died 86. 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 Quit 25+ yrs ago       Illicit Drug Use - no   Risk Factors: Tobacco use:  quit    Year quit:  1975 Passive smoke exposure:  no Drug use:  no HIV high-risk behavior:  no Caffeine use:  1 drinks per day Alcohol use:  no Exercise:  no Seatbelt use:  100 %  Colonoscopy History:    Date of Last Colonoscopy:  03/22/2005   Review of Systems  The patient denies anorexia, fever, weight loss, weight gain, vision loss, decreased hearing, hoarseness, chest pain, syncope, dyspnea on exertion, peripheral edema, prolonged cough, headaches, hemoptysis, abdominal pain, melena, hematuria, genital sores, muscle weakness, transient blindness, difficulty walking, unusual weight change, abnormal bleeding, and breast masses.     Physical Exam  General:     WNWD white male NAD Head:     Normocephalic and atraumatic without obvious abnormalities. No apparent  alopecia or balding. Eyes:     vision grossly intact, pupils equal, pupils round, corneas and lenses clear, no injection, and no retinal abnormalitiies.   Ears:     R ear normal, L ear normal, and no external deformities.   Nose:     no external deformity.   Mouth:     no oral lesions appreciated. Neck:     supple, full ROM, and no thyromegaly.   Chest Wall:     no deformities.   Lungs:     normal respiratory effort, no intercostal retractions, normal breath sounds, and no wheezes.   Heart:     normal rate, regular rhythm, and no murmur.   Abdomen:     soft, non-tender, normal bowel sounds, no distention, and no hepatomegaly.   Prostate:     deferred to age Msk:     normal ROM, no joint tenderness, no joint swelling, no joint warmth, and no joint instability.   Pulses:     2+ radial pulses Extremities:     No clubbing, cyanosis, edema, or deformity noted with normal full range of motion of all joints.   Neurologic:     alert & oriented X3, cranial nerves II-XII intact, strength normal in all extremities, gait normal, and DTRs symmetrical and normal.   Skin:     Intact without suspicious lesions or rashes Cervical Nodes:     no anterior cervical adenopathy.   Psych:     Oriented X3, memory intact for recent and remote, normally interactive, and good eye contact.      Impression & Recommendations:  Problem # 1:  BARRETT'S ESOPHAGUS (ICD-530.85) Closesly followed by Dr. Russella Dar. For EGD in March 2010.  Problem # 2:  HYPERLIPIDEMIA (ICD-272.4) For lab with adjustments in medication as indicated.   His updated medication list for this problem includes:    Lovastatin 40 Mg Tabs (Lovastatin) .[name] Kitchen... Take one tablet once daily  Orders: TLB-Lipid Panel (80061-LIPID) TLB-Hepatic/Liver Function Pnl (80076-HEPATIC)   addendum: great control with LDL 79. continue present meds.  Problem # 3:  HYPERTENSION (ICD-401.9)  His updated medication list for this problem includes:     Norvasc 10 Mg Tabs (Amlodipine besylate) .[name] Kitchen... Take 1 tablet by mouth once a day  Orders: TLB-BMP (Basic Metabolic Panel-BMET) (80048-METABOL)  BP today: 126/70 Prior BP: 120/66 (05/16/2008)  Labs Reviewed: Creat: 1.1 (09/12/2007) Chol: 176 (  06/26/2007)   HDL: 40.4 (06/26/2007)   LDL: 100 (06/26/2007)   TG: 177 (06/26/2007)  Good control. Continue present medications.   Problem # 4:  Preventive Health Care (ICD-V70.0) Up to date with colorectal cancer screening. Labs without abnormality including normal PSA.  In summary: pleasant gentleman with well controlled medical problems. He will return in 6 months for routine follow-up.  Complete Medication List: 1)  Aspirin 81 Mg Tabs (Aspirin) .... Take one tablet once daily 2)  Lovastatin 40 Mg Tabs (Lovastatin) .... Take one tablet once daily 3)  Gabapentin 300 Mg Caps (Gabapentin) .... Take one tablet 3 times daily 4)  Tylenol  .... Take as needed 5)  Norvasc 10 Mg Tabs (Amlodipine besylate) .... Take 1 tablet by mouth once a day 6)  Omeprazole 40 Mg Cpdr (Omeprazole) .... Take 1 tablet by mouth two times a day 30 minutes before a meal. 7)  Alrex 0.2 % Susp (Loteprednol etabonate) .... 2 drops once daily as needed 8)  Multivitamins Tabs (Multiple vitamin) .... Take 1 tablet by mouth once a day  Other Orders: TLB-CBC Platelet - w/Differential (85025-CBCD) TLB-PSA (Prostate Specific Antigen) (84153-PSA)  Patient: [name] Note: All result statuses are Final unless otherwise noted.  Tests: (1) LIPID PROFILE (LIPID)   CHOLESTEROL               168 mg/dL                   4-401       ATP III Classification:             < 200       mg/dL      Desireable            200 - 239    mg/dL      Borderline High            > = 240      mg/dL      High   TRIGLYCERIDES        [HH] 238 mg/dL                   0-272     REFLEX TESTING FOR LDLD        Normal:  < 150 mg/dL        Borderline High:  150 - 199 mg/dL   HDL                  [L]   35.8 mg/dL                  >53.6   VLDL CHOLESTEROL     [H]  48 mg/dl                    6-44   LDL CHOLESTEROL           DEL (D)  CHOL/HDL Ratio: CHD Risk                             4.7 CALC  Tests: (2) CBC WITH DIFF (CBCD)   WHITE CELL COUNT          8.2 K/uL                    4.5-10.5   RED CELL COUNT            4.92 Mil/uL  4.22-5.81   HEMOGLOBIN                15.4 g/dL                   16.1-09.6   HEMATOCRIT                44.1 %                      39.0-52.0   MCV                       89.6 fl                     78.0-100.0   MCHC                      34.9 g/dL                   04.5-40.9   RDW                       12.3 %                      11.5-14.6   PLATELET COUNT            179 K/uL                    150-400   NEUTROPHIL %              53.7 %                      43.0-77.0   LYMPHOCYTE %              33.3 %                      12.0-46.0   MONOCYTE %                8.3 %                       3.0-12.0   EOSINOPHILS %             3.9 %                       0.0-5.0   BASOPHILS %               0.8 %                       0.0-3.0  NEUTROPHILS, ABSOLUTE                             4.4 K/uL                    1.4-7.7   MONOCYTES, ABSOLUTE       0.7 K/uL                    0.1-1.0  EOSINOPHILS, ABSOLUTE                             0.3 K/uL  0.0-0.7   BASOPHILS, ABSOLUTE       0.1 K/uL                    0.0-0.1  Tests: (3) BASIC METABOLIC PANEL (METABOL)   SODIUM                    141 mEq/L                   135-145   POTASSIUM            [L]  3.4 mEq/L                   3.5-5.1   CHLORIDE                  107 mEq/L                   96-112   CARBON DIOXIDE            28 mEq/L                    19-32   GLUCOSE                   84 mg/dL                    36-64   BUN                       15 mg/dL                    4-03   CREATININE                1.3 mg/dL                   4.7-4.2   CALCIUM                   8.6 mg/dL                    5.9-56.3  GFR (AFRICAN AMERICAN)                             68 mL/min  GFR (NON-AFRICAN AMERICAN)                             56 mL/min  Tests: (4) HEPATIC FUNCTION PANEL (HEPATIC)   TOTAL BILIRUBIN           0.6 mg/dL                   8.7-5.6   DIRECT BILIRUBIN          0.1 mg/dL                   4.3-3.2   ALKALINE PHOSPHATASE      75 U/L                      39-117   SGOT (AST)                26 U/L                      0-37   SGPT (ALT)  20 U/L                      0-53   TOTAL PROTEIN             6.7 g/dL                    6.2-9.5   ALBUMIN                   4.0 g/dL                    2.8-4.1  Tests: (5) CHOLESTEROL, LDL-DIRECT (DIRLDL)  CHOLESTEROL, LDL-DIRECT                             79.3 mg/dL       ATP III Classification:             < 100      mg/dL     Optimal             100 - 129  mg/dL     Near or above optimal             130 - 159  mg/dL     Borderline High             160 - 189  mg/dL     High              > 190     mg/dL     Very High  Tests: (6) PSA_Hyb (PSA)   PSA_Hyb                   2.49 ng/mL                  0.10-4.00   ]

## 2010-09-23 NOTE — Progress Notes (Signed)
Summary: Refill  Phone Note Refill Request Message from:  Fax from Pharmacy on April 29, 2008 4:39 PM  Refills Requested: Medication #1:  LOVASTATIN 40 MG  TABS Take one tablet once daily   Last Refilled: 03/27/2008   Notes: #30 Initial call taken by: Zackery Barefoot CMA,  April 29, 2008 4:39 PM      Prescriptions: LOVASTATIN 40 MG  TABS (LOVASTATIN) Take one tablet once daily  #30 x 6   Entered by:   Zackery Barefoot CMA   Authorized by:   Jacques Navy MD   Signed by:   Zackery Barefoot CMA on 04/29/2008   Method used:   Electronically to        [facility]* (retail)       [address]       [place], [place]  [address]       Ph: [phone] or [phone]       Fax: [phone]   RxID:   [id number]

## 2010-09-23 NOTE — Letter (Signed)
Summary: Patient Notice-Endo Biopsy Results  [facility]  [address] [place], [place] [address]   Phone: [phone]  Fax: [phone]        September 03, 2008 MRN: [id number]    [name] [address] [place], [place]  [address]    Dear [name],  I am pleased to inform you that the biopsies taken during your recent endoscopic examination did not show any evidence of cancer upon pathologic examination. The biopsies were normal.   Continue with the treatment plan as outlined on the day of your      exam.  You should have a repeat endoscopic examination in 3 years as your previous biopsies showed Barrett's esophagus  Please call us if you are having persistent problems or have questions about your condition that have not been fully answered at this time.  Sincerely,  Meryl Dare MD [facility]  This letter has been electronically signed by your physician.

## 2010-09-23 NOTE — Progress Notes (Signed)
Summary: ABX  Phone Note Call from Patient Call back at Home Phone [phone]   Summary of Call: Patient spouse called the office to confirm that patient ABX can be taken with Nexium. Pt was recently given Amoxicillin for ear issue that required referral to ENT. Please advise. Initial call taken by: Lucious Groves,  November 04, 2009 11:25 AM  Follow-up for Phone Call        yep, it's ok Follow-up by: Jacques Navy MD,  November 04, 2009 1:25 PM  Additional Follow-up for Phone Call Additional follow up Details #1::        notified. Additional Follow-up by: Lucious Groves,  November 04, 2009 1:36 PM

## 2010-09-23 NOTE — Progress Notes (Signed)
Summary: Refill request  Phone Note Refill Request Message from:  Fax from Pharmacy  Refills Requested: Medication #1:  NORVASC 10 MG TABS Take 1 tablet by mouth once a day Initial call taken by: Zackery Barefoot CMA,  April 17, 2008 2:54 PM      Prescriptions: NORVASC 10 MG TABS (AMLODIPINE BESYLATE) Take 1 tablet by mouth once a day  #30 x 6   Entered by:   Zackery Barefoot CMA   Authorized by:   Jacques Navy MD   Signed by:   Zackery Barefoot CMA on 04/17/2008   Method used:   Electronically to        [facility]* (retail)       [address]       [place], [place]  [address]       Ph: [phone] or [phone]       Fax: [phone]   RxID:   [id number]

## 2010-09-23 NOTE — Procedures (Signed)
Summary: Recall / [facility]  Recall / [facility]   Imported By: Lennie Odor 01/21/2010 14:36:46  _____________________________________________________________________  External Attachment:    Type:   Image     Comment:   External Document

## 2010-09-23 NOTE — Procedures (Signed)
Summary: EGD   EGD  Procedure date:  08/27/2008  Findings:      Location: [facility]    Procedures Next Due Date:    EGD: 08/2011  ENDOSCOPY PROCEDURE REPORT  PATIENT:  [name], [name]  MR#:  [id number] BIRTHDATE:   [birth year]   GENDER:   male  ENDOSCOPIST:   Martin Lick. Russella Dar, MD, [facility] Referred by:   PROCEDURE DATE:  08/27/2008 PROCEDURE:  EGD with biopsy, EGD with dilatation over guidewire ASA CLASS:   Class II INDICATIONS: Barrett's Esophagus, dysphagia   MEDICATIONS:    Fentanyl 50 mcg IV, Versed 5 mg IV TOPICAL ANESTHETIC:   Exactacain Spray  DESCRIPTION OF PROCEDURE:   After the risks benefits and alternatives of the procedure were thoroughly explained, informed consent was obtained.  The LB [id number] endoscope was introduced through the mouth and advanced to the second portion of the duodenum, without limitations.  The instrument was slowly withdrawn as the mucosa was fully examined. <<PROCEDUREIMAGES>>      <<OLD IMAGES>>  Barrett's esophagus was found in the distal esophagus. It was 2 cm in size. With standard forceps, a biopsy was obtained and sent to pathology. Savary dilation over a guidewire was performed.  A hiatal hernia was found in the cardia. It was 4 cm in size.  Otherwise the examination was normal.    Retroflexed views revealed a hiatal hernia.    The scope was then withdrawn from the patient and the procedure completed.  COMPLICATIONS:   None  ENDOSCOPIC IMPRESSION:  1) 1 cm barrett's esophagus in the distal esophagus  2) 4 cm hiatal hernia in the cardia RECOMMENDATIONS:  1) anti-reflux regimen  2) await pathology results  3) PPI qam  REPEAT EXAM:   No   _______________________________ Martin Lick. Russella Dar, MD, [facility]    CC: Martin Navy MD      REPORT OF SURGICAL PATHOLOGY   Case #: [id number] Patient Name: [name], [name]. Office Chart Number:  N/A   MRN: [id number] Pathologist: H. Hollice Espy, MD DOB/Age   08/09/1926 (Age: 77)    Gender: M Date Taken:  08/27/2008 Date Received: 08/27/2008   FINAL DIAGNOSIS   ***MICROSCOPIC EXAMINATION AND DIAGNOSIS***   ESOPHAGUS, BIOPSY:   - GASTROESOPHAGEAL JUNCTION MUCOSA WITH MILD INFLAMMATION.   - NO INTESTINAL METAPLASIA, DYSPLASIA OR MALIGNANCY IDENTIFIED.   COMMENT Multiple deeper levels were obtained and yield no additional findings. An Alcian Blue stain is performed to determine the presence of intestinal metaplasia (goblet cell metaplasia). No intestinal metaplasia (goblet cell metaplasia) is identified with the Alcian Blue stain. The control stained appropriately. Clinical and endoscopic correlation is highly recommended.    cc Date Reported:  08/29/2008     H. Hollice Espy, MD *** Electronically Signed Out By [facility] ***   September 03, 2008 MRN: [id number]    [name] [address] [place], [place]  [address]    Dear [name],  I am pleased to inform you that the biopsies taken during your recent endoscopic examination did not show any evidence of cancer upon pathologic examination. The biopsies were normal.   Continue with the treatment plan as outlined on the day of your      exam.  You should have a repeat endoscopic examination in 3 years as your previous biopsies showed Barrett's esophagus  Please call us if you are having persistent problems or have questions about your condition that have not been fully answered at this time.  Sincerely,  Martin Dare MD [facility]  This letter has been electronically signed by your physician.  This report was created from the original endoscopy report, which was reviewed and signed by the above listed endoscopist.

## 2010-09-23 NOTE — Assessment & Plan Note (Signed)
Summary: ABD PAIN FOR A COUPLE OF DAYS/NWS   Vital Signs:  Patient profile:   77 year old male Height:      68 inches Weight:      166 pounds BMI:     25.33 O2 Sat:      96 % on Room air Temp:     97.6 degrees F oral Pulse rate:   71 / minute BP sitting:   130 / 68  (left arm) Cuff size:   regular  Vitals Entered By: Bill Salinas CMA (December 17, 2009 8:51 AM)  O2 Flow:  Room air CC: pt here with complaint of abd pain x 2 weeks, pt did have bout of diarrhea when theses symptoms first started but diarrhea has since resolved with abd pain remaining. Pt has taken OTC tylenol with no relief and denies any blood in his stool/ ab   Primary Care Provider:  Illene Regulus, MD  CC:  pt here with complaint of abd pain x 2 weeks and pt did have bout of diarrhea when theses symptoms first started but diarrhea has since resolved with abd pain remaining. Pt has taken OTC tylenol with no relief and denies any blood in his stool/ ab.  History of Present Illness: Patient presents for several weeks feeling weak and ill; he has no appetite, he has pain across the abdomen. Has had BMs but decreased volume, he is having trouble with hemorrhoids. No N/V, no fever, no chills, no energy.   Current Medications (verified): 1)  Aspirin 81 Mg  Tabs (Aspirin) .... Take One Tablet Once Daily 2)  Lovastatin 40 Mg  Tabs (Lovastatin) .... Take One Tablet Once Daily 3)  Gabapentin 300 Mg  Caps (Gabapentin) .... Take One Tablet 3 Times Daily 4)  Tylenol .... Take As Needed 5)  Norvasc 10 Mg Tabs (Amlodipine Besylate) .... Take 1 Tablet By Mouth Once A Day 6)  Nexium 40 Mg Cpdr (Esomeprazole Magnesium) .... One Tablet By Mouth Two Times A Day 7)  Multivitamins  Tabs (Multiple Vitamin) .... Take 1 Tablet By Mouth Once A Day  Allergies (verified): 1)  ! Lipitor (Atorvastatin)  Past History:  Past Medical History: Last updated: 11/30/2009 Hx of FX, ANGLE OF JAW, CLOSED (ICD-802.25) BARRETT'S ESOPHAGUS, HX OF  (ICD-V12.79) GERD (ICD-530.81) PROSTATITIS, HX OF (ICD-V13.09) Hx of HEMORRHOIDS (ICD-455.6) HYPERLIPIDEMIA (ICD-272.4) HYPERTENSION (ICD-401.9) von Willebrand's syndrome Gastritis Peptic Ulcer Disease Hearing Loss Cecal AVM Diverticulosis  Past Surgical History: Last updated: 2007-06-22 Inguinal herniorrhaphy-2001  Family History: Last updated: 06-22-07 father-died at 77. CAD/MI mother-died 86. HTN, cancer-pancreatic 1 sister - arthritis, Lipids 1 brother - bone cancer secondary to prostate cancer No DM, colon cancer  Social History: Last updated: 05/16/2008 married 1952 1 son, 1 daughter-deceased at 5 months retired-fulltime. "honey-do's" Patient is a former smoker. Quit 25+ yrs ago Illicit Drug Use - no  Risk Factors: Caffeine Use: 1 (06/22/07) Exercise: no (22-Jun-2007)  Risk Factors: Smoking Status: quit (05/16/2008) Passive Smoke Exposure: no (22-Jun-2007)  Review of Systems       The patient complains of weight loss, decreased hearing, and abdominal pain.  The patient denies anorexia, fever, chest pain, dyspnea on exertion, prolonged cough, melena, hematochezia, incontinence, suspicious skin lesions, abnormal bleeding, and enlarged lymph nodes.    Physical Exam  General:  elderly white male in no distress Head:  Normocephalic and atraumatic without obvious abnormalities. No apparent alopecia or balding. Lungs:  normal respiratory effort and normal breath sounds.   Heart:  normal rate and  regular rhythm.   Abdomen:  Very hypoactive BS, no HSM, diffuse tenderness, no masses.   Impression & Recommendations:  Problem # 1:  ABDOMINAL PAIN, UNSPECIFIED SITE (ICD-789.00) Patient with diffuse abdominal pain. Non-focal exam except for hypoactive BS  Plan - lab to r/o hepatitis or metabolic derrangement           KUB           Stop Statin drug as possible cause of pain.   Orders: TLB-Renal Function Panel (80069-RENAL) TLB-Hepatic/Liver Function Pnl  (80076-HEPATIC) TLB-CK Total Only(Creatine Kinase/CPK) (82550-CK) T-Abdomen 2-view (74020TC)  Addendum - labs normal, X-ray normal  Complete Medication List: 1)  Aspirin 81 Mg Tabs (Aspirin) .... Take one tablet once daily 2)  Lovastatin 40 Mg Tabs (Lovastatin) .... Take one tablet once daily 3)  Gabapentin 300 Mg Caps (Gabapentin) .... Take one tablet 3 times daily 4)  Tylenol  .... Take as needed 5)  Norvasc 10 Mg Tabs (Amlodipine besylate) .... Take 1 tablet by mouth once a day 6)  Nexium 40 Mg Cpdr (Esomeprazole magnesium) .... One tablet by mouth two times a day 7)  Multivitamins Tabs (Multiple vitamin) .... Take 1 tablet by mouth once a day

## 2010-09-23 NOTE — Progress Notes (Signed)
Summary: CT results  Phone Note Call from Patient   Summary of Call: Patient is requesting ct results, when do you want pt to come back to the office for follow up? Initial call taken by: Lamar Sprinkles,  September 18, 2007 9:15 AM  Follow-up for Phone Call        CT was unremarkable. I can see him back at any time. Follow-up by: Jacques Navy MD,  September 19, 2007 7:01 AM  Additional Follow-up for Phone Call Additional follow up Details #1::        Patient notified Additional Follow-up by: Rock Nephew CMA,  September 19, 2007 11:48 AM

## 2010-09-23 NOTE — Progress Notes (Signed)
Summary: TRIAGE   Phone Note Call from Patient Call back at Home Phone [phone]   Caller: Spouse Call For: STARK Reason for Call: Talk to Nurse Details for Reason: TRIAGE Summary of Call: probs with acid reflux Initial call taken by: Guadlupe Spanish [facility],  August 20, 2008 9:13 AM  Follow-up for Phone Call        patient wife states that patient c/o severe reflux, unable to keep food down.  She staets that he eats. but then his food comes back up later.  Taking omeprazole two times a day.  Pt  wife is instructed  he is to maintain anti-reflux diet. Advised to avoid caffeine, mint, citrus foods/juices, tomatoes, chocolate. Instructed not to eat within 2 hours of exercise or bed, small meals are better than 3 large. Need to take PPI 30 minutes prior to 1st meal of the day.  REV with Willette Cluster RNP 08-21-08 9:00 Follow-up by: Darcey Nora RN,  August 20, 2008 11:20 AM

## 2010-09-23 NOTE — Progress Notes (Signed)
Summary: rx  Medications Added OMEPRAZOLE 40 MG  CPDR (OMEPRAZOLE) Take 1 tablet by mouth two times a day 30 minutes before a meal.  Please keep appt!       Phone Note Call from Patient Call back at Home Phone [phone]   Caller: Spouse Call For: STARK Reason for Call: Talk to Nurse Details for Reason: rx Summary of Call: pt did not realize that he needs to make appt to get refill pt is down to 2days of meds can enough meds be called in until REV on 9-25 RX OMEPRAZOLE [facility]* Initial call taken by: Guadlupe Spanish [facility],  April 29, 2008 9:32 AM  Follow-up for Phone Call        no voicemail and no answer. Christie Nottingham CMA  April 29, 2008 9:37 AM  Rx was sent to pts pharmacy. Pt notified to keep appt for any further refills. Follow-up by: Christie Nottingham CMA,  April 29, 2008 12:08 PM    New/Updated Medications: OMEPRAZOLE 40 MG  CPDR (OMEPRAZOLE) Take 1 tablet by mouth two times a day 30 minutes before a meal.  Please keep appt!   Prescriptions: OMEPRAZOLE 40 MG  CPDR (OMEPRAZOLE) Take 1 tablet by mouth two times a day 30 minutes before a meal.  Please keep appt!  #30 x 0   Entered by:   Christie Nottingham CMA   Authorized by:   Meryl Dare MD [facility]   Signed by:   Christie Nottingham CMA on 04/29/2008   Method used:   Electronically to        [facility]* (retail)       [address]       [place], [place]  [address]       Ph: [phone] or [phone]       Fax: [phone]   RxID:   [id number]

## 2010-09-23 NOTE — Consult Note (Signed)
Summary: [facility]  [facility]   Imported By: Sherian Rein 12/10/2009 13:59:38  _____________________________________________________________________  External Attachment:    Type:   Image     Comment:   External Document

## 2010-09-23 NOTE — Assessment & Plan Note (Signed)
Summary: yearly f/u, med refills/FH   History of Present Illness Visit Type: follow up Primary GI Fields: [name] [facility] Primary [name]: Martin Regulus, Fields Chief Complaint: Needs refill on Omeprazole.  GERD controlled on medication. History of Present Illness:   [name] returns for followup of GERD/Barrett's esophagus. His reflux symptoms are under very good control. He has occasional break through symptoms with certain foods and if he reclines after a meal. His last endoscopy was in March of 2008 that showed Barrett's esophagus with focal atypia consistent with low-grade dysplasia. He notes occasional small amounts of rectal bleeding and he takes an over-the-counter suppository as needed every few months and his symptoms respond promptly.   GI Review of Systems      Denies abdominal pain, acid reflux, belching, bloating, chest pain, dysphagia with liquids, dysphagia with solids, heartburn, loss of appetite, nausea, vomiting, vomiting blood, weight loss, and  weight gain.      Reports hemorrhoids.     Denies anal fissure, black tarry stools, change in bowel habit, constipation, diarrhea, diverticulosis, fecal incontinence, heme positive stool, irritable bowel syndrome, jaundice, light color stool, liver problems, rectal bleeding, and  rectal pain.   Prior Medications Reviewed Using: List Brought by Patient  Updated Prior Medication List: ASPIRIN 81 MG  TABS (ASPIRIN) Take one tablet once daily LOVASTATIN 40 MG  TABS (LOVASTATIN) Take one tablet once daily GABAPENTIN 300 MG  CAPS (GABAPENTIN) Take one tablet 3 times daily * TYLENOL Take as needed NORVASC 10 MG TABS (AMLODIPINE BESYLATE) Take 1 tablet by mouth once a day OMEPRAZOLE 40 MG  CPDR (OMEPRAZOLE) Take 1 tablet by mouth two times a day 30 minutes before a meal.  Please keep appt! ALREX 0.2 % SUSP (LOTEPREDNOL ETABONATE) 2 drops once daily as needed MULTIVITAMINS  TABS (MULTIPLE VITAMIN) Take 1 tablet by mouth once a  day  Current Allergies (reviewed today): No known allergies  Past Medical History:    Reviewed history from 05/14/2008 and no changes required:       Hx of FX, ANGLE OF JAW, CLOSED (ICD-802.25)       BARRETT'S ESOPHAGUS, HX OF (ICD-V12.79)       GERD (ICD-530.81)       PROSTATITIS, HX OF (ICD-V13.09)       Hx of HEMORRHOIDS (ICD-455.6)       HYPERLIPIDEMIA (ICD-272.4)       HYPERTENSION (ICD-401.9)        von Willebrand's syndrome       Gastritis       Peptic Ulcer Disease  Past Surgical History:    Reviewed history from 06/18/2007 and no changes required:       Inguinal herniorrhaphy-2001  Family History:    Reviewed history from 06/18/2007 and no changes required:       father-died at 75. CAD/MI       mother-died 86. 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 Quit 25+ yrs ago       Illicit Drug Use -  no  Risk Factors:  Tobacco use:  quit    Year quit:  1975 Drug use:  no  Review of Systems       The pertinent positives and negatives are noted as above and in the HPI. All other ROS were negative.   Vital Signs:  Patient Profile:   77 Years Old Male Height:     68 inches Weight:      168 pounds BMI:     25.64 Pulse rate:   68 / minute Pulse rhythm:   regular BP sitting:   120 / 66  (left arm) Cuff size:   regular  Vitals Entered By: Francee Piccolo CMA (May 16, 2008 3:27 PM)                  Physical Exam  Head:     Normocephalic and atraumatic. Eyes:     PERRLA, no icterus. Mouth:     No deformity or lesions, dentition normal. Lungs:     Clear throughout to auscultation. Heart:     Regular rate and rhythm; no murmurs,  rubs,  or bruits. Abdomen:     Soft, nontender and nondistended. No masses, hepatosplenomegaly or hernias noted. Normal bowel sounds. Neurologic:     Alert and  oriented x4;  grossly normal neurologically. Psych:     Alert and cooperative. Normal mood and affect.   Impression & Recommendations:  Problem # 1:  BARRETT'S ESOPHAGUS (ICD-530.85) Continue omeprazole 40 mg q.a.m. Change surveillance endoscopy to March of 2010.  Problem # 2:  HEMORRHOIDS-INTERNAL (ICD-455.0) Intermittent hemorrhoidal symptoms. Over-the-counter Anusol suppositories (or the generic equivalent) twice a day as needed. Colonoscopy performed in August 2006 showing diverticulosis and internal hemorrhoids.  Patient Instructions: 1)  Avoid foods high in acid content (tomatoes, citrus juices, spicy foods). Avoid eating within 3 to 4 hours of lying down or before exercising. Do not over eat; try smaller more frequent meals. Elevate head of bed four inches when sleeping.  ]  Appended Document: yearly f/u, med refills/FH    Clinical Lists Changes  Medications: Changed medication from OMEPRAZOLE 40 MG  CPDR (OMEPRAZOLE) Take 1 tablet by mouth two times a day 30 minutes before a meal.  Please keep appt! to OMEPRAZOLE 40 MG  CPDR (OMEPRAZOLE) Take 1 tablet by mouth two times a day 30 minutes before a meal. - Signed Rx of OMEPRAZOLE 40 MG  CPDR (OMEPRAZOLE) Take 1 tablet by mouth two times a day 30 minutes before a meal.;  #60 x 11;  Signed;  Entered by: [name] CMA;  Authorized by: Martin Dare Fields [facility];  Method used: Electronically to [facility]*, [address], [place], [place]  [address], Ph: [phone], Fax: [phone]    Prescriptions: OMEPRAZOLE 40 MG  CPDR (OMEPRAZOLE) Take 1 tablet by mouth two times a day 30 minutes before a meal.  #60 x 11   Entered by:   [name] CMA   Authorized by:   Martin Dare Fields [facility]   Signed by:   [name] CMA on 05/16/2008   Method used:    Electronically to        [facility]* (retail)       [address]       [place], [place]  [address]       Ph: [phone]       Fax: [phone]   RxID:   [id number]

## 2010-09-23 NOTE — Procedures (Signed)
Summary: Colon   Colonoscopy  Procedure date:  03/22/2005  Findings:      Results: Hemorrhoids.     Results: Diverticulosis.       Location:  [facility].    Procedures Next Due Date:    Colonoscopy: 03/2010  Patient Name: Martin, Fields MRN:  Procedure Procedures: Colonoscopy CPT: [address].  Personnel: Endoscopist: Venita Lick. Russella Dar, MD, Clementeen Graham.  Exam Location: Exam performed in Outpatient Clinic. Outpatient  Patient Consent: Procedure, Alternatives, Risks and Benefits discussed, consent obtained, from patient. Consent was obtained by the RN.  Indications  Average Risk Screening Routine.  History  Current Medications: Patient is not currently taking Coumadin.  Pre-Exam Physical: Performed Mar 22, 2005. Cardio-pulmonary exam, Rectal exam, HEENT exam , Abdominal exam, Mental status exam WNL.  Exam Exam: Extent of exam reached: Cecum, extent intended: Cecum.  The cecum was identified by appendiceal orifice and IC valve. Colon retroflexion performed. ASA Classification: II. Tolerance: excellent.  Monitoring: Pulse and BP monitoring, Oximetry used. Supplemental O2 given.  Colon Prep Used Glycolax for colon prep. Prep results: good.  Sedation Meds: Patient assessed and found to be appropriate for moderate (conscious) sedation. Fentanyl 50 mcg. given IV. Versed 5 mg. given IV.  Findings NORMAL EXAM: Cecum to Descending Colon.  - DIVERTICULOSIS: Sigmoid Colon. Not bleeding. ICD9: Diverticulosis: 562.10.  HEMORRHOIDS: Internal. Size: Medium. Not bleeding. Not thrombosed. ICD9: Hemorrhoids, Internal: 455.0.   Assessment  Diagnoses: 562.10: Diverticulosis.  455.0: Hemorrhoids, Internal.   Events  Unplanned Interventions: No intervention was required.  Unplanned Events: There were no complications. Plans Patient Education: Patient given standard instructions for: Diverticulosis. Hemorrhoids.  Disposition: After procedure patient sent to recovery.  After recovery patient sent home.  Scheduling/Referral: Colonoscopy, to [facility]. Russella Dar, MD, [facility], around Mar 22, 2013.  EGD, to [facility]. Russella Dar, MD, [facility], around Oct 07, 2006.    This report was created from the original endoscopy report, which was reviewed and signed by the above listed endoscopist.    UE:[id number] Norins, MD

## 2010-09-23 NOTE — Assessment & Plan Note (Signed)
Summary: rx renewal .e.m   History of Present Illness Visit Type: Follow-up Visit Primary GI MD: Elie Goody MD [facility] Primary Provider: Illene Regulus, MD Requesting Provider: n/a Chief Complaint: Refill Nexium. Pt states that he is fine and denies any GI complaints  History of Present Illness:   This is a return office visit for GERD and Barrett's esophagus. His reflux symptoms are under very good control on Nexium 40 mg twice daily. He has occasional breakthrough symptoms.   GI Review of Systems      Denies abdominal pain, acid reflux, belching, bloating, chest pain, dysphagia with liquids, dysphagia with solids, heartburn, loss of appetite, nausea, vomiting, vomiting blood, weight loss, and  weight gain.        Denies anal fissure, black tarry stools, change in bowel habit, constipation, diarrhea, diverticulosis, fecal incontinence, heme positive stool, hemorrhoids, irritable bowel syndrome, jaundice, light color stool, liver problems, rectal bleeding, and  rectal pain.   Current Medications (verified): 1)  Aspirin 81 Mg  Tabs (Aspirin) .... Take One Tablet Once Daily 2)  Lovastatin 40 Mg  Tabs (Lovastatin) .... Take One Tablet Once Daily 3)  Gabapentin 300 Mg  Caps (Gabapentin) .... Take One Tablet 3 Times Daily 4)  Tylenol .... Take As Needed 5)  Norvasc 10 Mg Tabs (Amlodipine Besylate) .... Take 1 Tablet By Mouth Once A Day 6)  Nexium 40 Mg Cpdr (Esomeprazole Magnesium) .... One Tablet By Mouth Two Times A Day  Keep Appt For Any Further Refills. 7)  Multivitamins  Tabs (Multiple Vitamin) .... Take 1 Tablet By Mouth Once A Day  Allergies (verified): 1)  ! Lipitor (Atorvastatin)  Past History:  Past Medical History: Hx of FX, ANGLE OF JAW, CLOSED (ICD-802.25) BARRETT'S ESOPHAGUS, HX OF (ICD-V12.79) GERD (ICD-530.81) PROSTATITIS, HX OF (ICD-V13.09) Hx of HEMORRHOIDS (ICD-455.6) HYPERLIPIDEMIA (ICD-272.4) HYPERTENSION (ICD-401.9) von Willebrand's  syndrome Gastritis Peptic Ulcer Disease Hearing Loss Cecal AVM Diverticulosis  Past Surgical History: Reviewed history from 06/18/2007 and no changes required. Inguinal herniorrhaphy-2001  Family History: Reviewed history from 06/18/2007 and no changes required. father-died at 50. CAD/MI mother-died 86. HTN, cancer-pancreatic 1 sister - arthritis, Lipids 1 brother - bone cancer secondary to prostate cancer No DM, colon cancer  Social History: Reviewed history from 05/16/2008 and no changes required. married Jan 25, 1951 1 son, 1 daughter-deceased at 5 months retired-fulltime. "honey-do's" Patient is a former smoker. Quit 25+ yrs ago Illicit Drug Use - no  Review of Systems       The patient complains of hearing problems.         The pertinent positives and negatives are noted as above and in the HPI. All other ROS were reviewed and were negative.   Vital Signs:  Patient profile:   77 year old male Height:      68 inches Weight:      170 pounds BMI:     25.94 BSA:     1.91 Pulse rate:   76 / minute Pulse rhythm:   regular BP sitting:   128 / 60  (left arm) Cuff size:   regular  Vitals Entered By: Ok Anis CMA (November 30, 2009 2:16 PM)  Physical Exam  General:  Well developed, well nourished, no acute distress. Head:  Normocephalic and atraumatic. Eyes:  PERRLA, no icterus. Ears:  markedly decreased auditory acuity.   Lungs:  Clear throughout to auscultation. Heart:  Regular rate and rhythm; no murmurs, rubs,  or bruits. Abdomen:  Soft, nontender and nondistended. No masses, hepatosplenomegaly  or hernias noted. Normal bowel sounds. Psych:  Alert and cooperative. Normal mood and affect.  Impression & Recommendations:  Problem # 1:  BARRETT'S ESOPHAGUS (ICD-530.85) Continue Nexium 40 mg twice daily, and standard antireflux measures. Surveillance EGD recommended in January 2013.  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  Patient  Instructions: 1)  Please continue current medications.  2)  Please schedule a follow-up appointment in 1 year. 3)  The medication list was reviewed and reconciled.  All changed / newly prescribed medications were explained.  A complete medication list was provided to the patient / caregiver.  Prescriptions: NEXIUM 40 MG CPDR (ESOMEPRAZOLE MAGNESIUM) one tablet by mouth two times a day  #60 x 11   Entered by:   Christie Nottingham CMA (AAMA)   Authorized by:   Meryl Dare MD [facility]   Signed by:   Meryl Dare MD FACG on 11/30/2009   Method used:   Electronically to        [facility]* (retail)       [address]       [place], [place]  [address]       Ph: [phone]       Fax: [phone]   RxID:   [id number]

## 2010-09-23 NOTE — Procedures (Signed)
Summary: EGD   EGD  Procedure date:  11/09/2006  Findings:      Barrett's esophagus Hiatal Hernia Location: [facility]    Procedures Next Due Date:    EGD: 11/2009  Patient Name: [name], [name]. MRN:  Procedure Procedures: Panendoscopy (EGD) CPT: 43235.    with biopsy(s)/brushing(s). CPT: [id number].  Personnel: Endoscopist: Venita Lick. Russella Dar, MD, Clementeen Graham.  Exam Location: Exam performed in Outpatient Clinic. Outpatient  Patient Consent: Procedure, Alternatives, Risks and Benefits discussed, consent obtained, from patient. Consent was obtained by the RN.  Indications  Surveillance of: Barrett's Esophagus.  History  Current Medications: Patient is not currently taking Coumadin.  Pre-Exam Physical: Performed Nov 09, 2006  Cardio-pulmonary exam, HEENT exam, Abdominal exam, Mental status exam WNL.  Comments: Pt. history reviewed/updated, physical exam performed prior to initiation of sedation?Yes Exam Exam Info: Maximum depth of insertion Duodenum, intended Duodenum. Vocal cords not visualized. Gastric retroflexion performed. ASA Classification: I. Tolerance: excellent.  Sedation Meds: Patient assessed and found to be appropriate for moderate (conscious) sedation. Fentanyl 25 mcg. given IV. Versed 5 mg. given IV. Cetacaine Spray 2 sprays given aerosolized.  Monitoring: BP and pulse monitoring done. Oximetry used. Supplemental O2 given  Findings Normal: Proximal Esophagus to Mid Esophagus.  BARRETT'S ESOPHAGUS: Z Line 38 cm from mouth, Length of Barrett's 2 cm. No inflammation present. Biopsy/Barrett's taken. ICD9: Barrett's: 530.2.  HIATAL HERNIA: Regular, 4 cms. in length. ICD9: Hernia, Hiatal: 553.3. Normal: Fundus to Duodenal 2nd Portion.   Assessment  Diagnoses: 530.2: Barrett's.  553.3: Hernia, Hiatal.   Events  Unplanned Intervention: No unplanned interventions were required.  Unplanned Events: There were no  complications. Plans Medication(s): PPI: BID, for indefinitely.   Patient Education: Patient given standard instructions for: Barrett's. Hiatal Hernia. Reflux.  Disposition: After procedure patient sent to recovery. After recovery patient sent home.  Scheduling: EGD, to [employer]. Russella Dar, MD, [facility], around Nov 08, 2009.    This report was created from the original endoscopy report, which was reviewed and signed by the above listed endoscopist.    cc: Illene Regulus, MD

## 2010-09-23 NOTE — Letter (Signed)
   [facility] [address] [place], [place]  [address] Phone: [phone]      December 18, 2009   JAYEN BROMWELL [address] [place], [place] [address]  RE:  LAB RESULTS  Dear  Mr. SPOFFORD,  The following is an interpretation of your most recent lab tests.  Please take note of any instructions provided or changes to medications that have resulted from your lab work.  ELECTROLYTES:  Good - no changes needed  KIDNEY FUNCTION TESTS:  Good - no changes needed  LIVER FUNCTION TESTS:  Good - no changes needed   DIABETIC STUDIES:  Good - no changes needed Blood Glucose: 89    X-ray was normal.   I hope you are feeling better. All labs and x-ray are normal   Sincerely Yours,    Jacques Navy MD

## 2010-09-23 NOTE — Assessment & Plan Note (Signed)
Summary: abd pain > SD   Vital Signs:  Patient Profile:   77 Years Old Male Height:     68 inches Weight:      171 pounds Temp:     97.2 degrees F oral Pulse rate:   76 / minute BP sitting:   143 / 80  (left arm) Cuff size:   regular  Vitals Entered By: Zackery Barefoot CMA (August 28, 2007 2:19 PM)                 PCP:  Norins  Chief Complaint:  Abdominal Pain.  History of Present Illness: Patient reports bilateral groin pain, perineal pain and increased urinary frequency.  Current Allergies: No known allergies       Physical Exam  General:     Well-developed,well-nourished,in no acute distress; alert,appropriate and cooperative throughout examination Rectal:     NST, prostate firm but tender, not fluctuant. Genitalia:     bilateral, atrophic testicles. Prostate:     firm, tender.    Impression & Recommendations:  Problem # 1:  PROSTATITIS, HX OF (ICD-V13.09) patient with recurrent symptoms of prostatitis.  Plan: Cipro 250mg  two times a day x 14  Complete Medication List: 1)  Aspirin 81 Mg Tabs (Aspirin) .... Take one tablet once daily 2)  Omeprazole 20 Mg Tbec (Omeprazole) .... Take 2 tablets twice daily 3)  Lovastatin 40 Mg Tabs (Lovastatin) .... Take one tablet once daily 4)  Gabapentin 300 Mg Caps (Gabapentin) .... Take one tablet 3 times daily 5)  Tylenol  .... Take as needed 6)  Norvasc 10 Mg Tabs (Amlodipine besylate) .... Take 1 tablet by mouth once a day 7)  Ciprofloxacin Hcl 250 Mg Tabs (Ciprofloxacin hcl)     Prescriptions: CIPROFLOXACIN HCL 250 MG  TABS (CIPROFLOXACIN HCL)   #28 x 1   Entered and Authorized by:   Jacques Navy MD   Signed by:   Jacques Navy MD on 08/28/2007   Method used:   Electronically sent to ...       [facility]*       [address]       [place], [place]  [address]       Ph: [phone] or [phone]       Fax: [phone]   RxID:   [id number]  ]

## 2010-09-23 NOTE — Consult Note (Signed)
Summary: [facility]  [facility]   Imported By: Lester [place] 03/26/2010 12:22:30  _____________________________________________________________________  External Attachment:    Type:   Image     Comment:   External Document

## 2010-09-23 NOTE — Assessment & Plan Note (Signed)
Summary: reflux, vomiting/ sheri    History of Present Illness Visit Type: follow up Primary GI MD: Elie Goody MD [facility] Primary Provider: Illene Regulus, MD Chief Complaint: vomiting/reflux History of Present Illness:   [name] no longer feels twice daily Omeprazole is helping his GERD. The patient is having frequent heartburn, worse at night. He often regurgitates undigested foods. Lately the patient has been having solid food dysphagia. Sometimes to relieve the heartburn and remove impacted food particles the patient makes himself vomit. No other gastrointestinal complaints. Patient has a history of Barrett's followed by  Dr. Russella Dar   GI Review of Systems    Reports acid reflux, nausea, and  vomiting.      Denies abdominal pain, belching, bloating, chest pain, dysphagia with liquids, dysphagia with solids, heartburn, loss of appetite, vomiting blood, weight loss, and  weight gain.        Denies anal fissure, black tarry stools, change in bowel habit, constipation, diarrhea, diverticulosis, fecal incontinence, heme positive stool, hemorrhoids, irritable bowel syndrome, jaundice, light color stool, liver problems, rectal bleeding, and  rectal pain.     Prior Medications Reviewed Using: Patient Recall  Updated Prior Medication List: ASPIRIN 81 MG  TABS (ASPIRIN) Take one tablet once daily LOVASTATIN 40 MG  TABS (LOVASTATIN) Take one tablet once daily GABAPENTIN 300 MG  CAPS (GABAPENTIN) Take one tablet 3 times daily * TYLENOL Take as needed NORVASC 10 MG TABS (AMLODIPINE BESYLATE) Take 1 tablet by mouth once a day OMEPRAZOLE 40 MG  CPDR (OMEPRAZOLE) Take 1 tablet by mouth two times a day 30 minutes before a meal. ALREX 0.2 % SUSP (LOTEPREDNOL ETABONATE) 2 drops once daily as needed MULTIVITAMINS  TABS (MULTIPLE VITAMIN) Take 1 tablet by mouth once a day  Current Allergies: No known allergies   Past Medical History:    Reviewed history from 05/14/2008 and no changes  required:       Hx of FX, ANGLE OF JAW, CLOSED (ICD-802.25)       BARRETT'S ESOPHAGUS, HX OF (ICD-V12.79)       GERD (ICD-530.81)       PROSTATITIS, HX OF (ICD-V13.09)       Hx of HEMORRHOIDS (ICD-455.6)       HYPERLIPIDEMIA (ICD-272.4)       HYPERTENSION (ICD-401.9)        von Willebrand's syndrome       Gastritis       Peptic Ulcer Disease  Past Surgical History:    Reviewed history from 06/18/2007 and no changes required:       Inguinal herniorrhaphy-2001   Family History:    Reviewed history from 06/18/2007 and no changes required:       father-died at 8. CAD/MI       mother-died 86. HTN, cancer-pancreatic       1 sister - arthritis, Lipids       1 brother - bone cancer secondary to prostate cancer       No DM, colon cancer  Social History:    Reviewed history from 05/16/2008 and no changes required:       married 1952       1 son, 1 daughter-deceased at 5 months       retired-fulltime. "[employer]"       Patient is a former smoker. Quit 25+ yrs ago       Illicit Drug Use - no    Review of Systems  The patient denies allergy/sinus, anemia, anxiety-new, arthritis/joint pain, back pain,  blood in urine, breast changes/lumps, change in vision, confusion, cough, coughing up blood, depression-new, fainting, fatigue, fever, headaches-new, hearing problems, heart murmur, heart rhythm changes, itching, menstrual pain, muscle pains/cramps, night sweats, nosebleeds, pregnancy symptoms, shortness of breath, skin rash, sleeping problems, sore throat, swelling of feet/legs, swollen lymph glands, thirst - excessive , urination - excessive , urination changes/pain, urine leakage, vision changes, and voice change.     Vital Signs:  Patient Profile:   77 Years Old Male Height:     68 inches Weight:      168.50 pounds BMI:     25.71 Pulse rate:   80 / minute Pulse rhythm:   regular BP sitting:   124 / 62  (left arm)  Vitals Entered By: June McMurray CMA (August 21, 2008 8:53  AM)                  Physical Exam  General:     Well developed, well nourished, no acute distress. Head:     Normocephalic and atraumatic. Eyes:     Red conjunctiva. No icterus. Mouth:     No oral lesions. Tongue moist.  Neck:     Supple; no masses. Lungs:     Clear throughout to auscultation. Heart:     Regular rate and rhythm; no murmurs, rubs,  or bruits. Abdomen:     Soft, nontender and nondistended. No masses, hepatosplenomegaly or hernias noted. Normal bowel sounds. Msk:     Symmetrical with no gross deformities. Normal posture. Neurologic:     Alert and  oriented x4;  grossly normal neurologically. Skin:     Intact without significant lesions or rashes. Cervical Nodes:     No significant cervical adenopathy. Psych:     Alert and cooperative. Normal mood and affect.    Impression & Recommendations:  Problem # 1:  GERD (ICD-530.81) Pyrosis uncontrolled on twice daily Prilosec. Having a lot of nocturnal heartburn despite anti-reflux measures. Will try Nexium as it is on patient's insurance formulary. If that fails, may try Kapidex given nocturnal symptoms.   Problem # 2:  BARRETT'S ESOPHAGUS (ICD-530.85) Last EGD March 2008, biopsies compatible with low grade dysplasia. Not due for surveillance endoscopy until March 2010 but having solid food dysphagia and regurgitation of undigested food. Will go ahead and schedule  patient for an EGD with biopsies/ esophageal dilation (if indicated).  The risks and benefits of the procedure, as well as alternatives were discussed with the patient and he agrees to proceed.   Orders: EGD (EGD)   Problem # 3:  DYSPHAGIA (ICD-787.29) Solid food dysphagia. Rule esophageal stricture, dysmotility. See #2.  Orders: EGD (EGD)   Problem # 4:  VON WILLEBRAND'S DISEASE (ICD-286.4) Reported in past medical historyu and may indicate a need for prohylactic medication to reduce risk of bleeding prior to EGD. Will have reviewed on  08/26/07. Iva Boop MD  August 23, 2008 9:08 AM    Patient Instructions: 1)  Copy Sent To: Michael Norins 2)  Nexium two times a day. 3)  We have scheduled your Endoscopy for 08-27-2008 @ 9:30AM.  4)  Come to our 4th floor.     ]

## 2010-09-23 NOTE — Miscellaneous (Signed)
Summary: Nexium refill  Clinical Lists Changes  Medications: Changed medication from NEXIUM 40 MG CPDR (ESOMEPRAZOLE MAGNESIUM) one tablet by mouth two times a day to NEXIUM 40 MG CPDR (ESOMEPRAZOLE MAGNESIUM) one tablet by mouth two times a day NEEDS OFFICE VISIT FOR ANY FURTHER REFILLS!! - Signed Rx of NEXIUM 40 MG CPDR (ESOMEPRAZOLE MAGNESIUM) one tablet by mouth two times a day NEEDS OFFICE VISIT FOR ANY FURTHER REFILLS!!;  #60 x 0;  Signed;  Entered by: Christie Nottingham CMA (AAMA);  Authorized by: Meryl Dare MD [facility];  Method used: Electronically to [facility]*, [address], [place], [place]  [address], Ph: [phone], Fax: [phone]    Prescriptions: NEXIUM 40 MG CPDR (ESOMEPRAZOLE MAGNESIUM) one tablet by mouth two times a day NEEDS OFFICE VISIT FOR ANY FURTHER REFILLS!!  #60 x 0   Entered by:   Christie Nottingham CMA (AAMA)   Authorized by:   Meryl Dare MD [facility]   Signed by:   Christie Nottingham CMA (AAMA) on 10/01/2009   Method used:   Electronically to        [facility]* (retail)       [address]       [place], [place]  [address]       Ph: [phone]       Fax: [phone]   RxID:   [id number]

## 2010-09-23 NOTE — Consult Note (Signed)
Summary: Office Visit / [facility]  Office Visit / [facility]   Imported By: Lennie Odor 06/15/2009 15:23:51  _____________________________________________________________________  External Attachment:    Type:   Image     Comment:   External Document

## 2010-09-23 NOTE — Consult Note (Signed)
Summary: [facility]  [facility]   Imported By: Lester [place] 09/04/2009 10:32:56  _____________________________________________________________________  External Attachment:    Type:   Image     Comment:   External Document

## 2010-09-23 NOTE — Progress Notes (Signed)
Summary: KEEP APT??  Phone Note Call from Patient   Complaint: Urinary/GYN Problems Summary of Call: Before original phone note this am pt scheduled apt with you for tomorrow. Does pt need to keep apt? or schedule after CT? Pt is still having testicle pain. Initial call taken by: Lamar Sprinkles,  September 12, 2007 3:55 PM  Follow-up for Phone Call        Per Dr Debby Bud no, Pt informed  Follow-up by: Lamar Sprinkles,  September 12, 2007 5:59 PM

## 2010-09-23 NOTE — Consult Note (Signed)
Summary: Office Visit / [facility]  Office Visit / [facility]   Imported By: Lennie Odor 05/20/2009 15:02:29  _____________________________________________________________________  External Attachment:    Type:   Image     Comment:   External Document

## 2010-09-23 NOTE — Consult Note (Signed)
Summary: Consult / [facility]  Consult / [facility]   Imported By: Lennie Odor 07/09/2009 15:49:32  _____________________________________________________________________  External Attachment:    Type:   Image     Comment:   External Document

## 2010-09-23 NOTE — Assessment & Plan Note (Signed)
Summary: CT RESULTS/LEF   Vital Signs:  Patient Profile:   77 Years Old Male Height:     68 inches Weight:      174 pounds Temp:     97.1 degrees F oral Pulse rate:   82 / minute BP sitting:   149 / 80  (left arm) Cuff size:   regular  Vitals Entered By: Zackery Barefoot CMA (September 21, 2007 3:23 PM)                 PCP:  Aliz Meritt  Chief Complaint:  FOLLOW UP ON SCAN.  History of Present Illness: follow up for  abdominal pain. Continues to have intermittant discomfort in the right testicle, but no swellling no dysuria. He has minimal discomfort at the umbilicus where he has a small hernia. He has had a normal appetite, bowel habit, micturition.  Current Allergies: No known allergies       Physical Exam  General:     alert, well-developed, well-nourished, well-hydrated, and healthy-appearing.   Abdomen:     small umbilical hernia which is reducible but tender to palpation.    Impression & Recommendations:  Problem # 1:  ABDOMINAL PAIN, LOWER (ICD-789.09) Reviewed CT scan with the patient - normal study. His symptoms are minimal and really located in the right testicle at this time. He was offered a referral to urology but declined prefering to wait and see if his symptoms get better. He will let me know if it gets worse.  His updated medication list for this problem includes:    Aspirin 81 Mg Tabs (Aspirin) .[name] Kitchen... Take one tablet once daily   Problem # 2:  UMBILICAL HERNIA (ICD-553.1) Small hernia tender to manipulation but reducible.  Plan: advised the patient that if the hernia becomes difficult to reduce or if he develops more pain then surgical consultation will be necessary.  Complete Medication List: 1)  Aspirin 81 Mg Tabs (Aspirin) .... Take one tablet once daily 2)  Omeprazole 20 Mg Tbec (Omeprazole) .... Take 2 tablets twice daily 3)  Lovastatin 40 Mg Tabs (Lovastatin) .... Take one tablet once daily 4)  Gabapentin 300 Mg Caps (Gabapentin) ....  Take one tablet 3 times daily 5)  Tylenol  .... Take as needed 6)  Norvasc 10 Mg Tabs (Amlodipine besylate) .... Take 1 tablet by mouth once a day 7)  Ciprofloxacin Hcl 250 Mg Tabs (Ciprofloxacin hcl) .... Take 1 tablet by mouth two times a day     ]

## 2010-09-23 NOTE — Progress Notes (Signed)
  Phone Note Refill Request Message from:  Fax from Pharmacy on June 09, 2009 3:44 PM  Refills Requested: Medication #1:  NORVASC 10 MG TABS Take 1 tablet by mouth once a day Initial call taken by: Ami Bullins CMA,  June 09, 2009 3:44 PM    Prescriptions: NORVASC 10 MG TABS (AMLODIPINE BESYLATE) Take 1 tablet by mouth once a day  #30 x 6   Entered by:   Ami Bullins CMA   Authorized by:   Jacques Navy MD   Signed by:   Bill Salinas CMA on 06/09/2009   Method used:   Electronically to        [facility]* (retail)       [address]       [place], [place]  [address]       Ph: [phone]       Fax: [phone]   RxID:   [id number]

## 2010-12-17 ENCOUNTER — Other Ambulatory Visit: Payer: Self-pay

## 2010-12-17 MED ORDER — ESOMEPRAZOLE MAGNESIUM 40 MG PO CPDR: 40.0000 mg | DELAYED_RELEASE_CAPSULE | Freq: Two times a day (BID) | ORAL | Status: DC

## 2010-12-17 NOTE — Telephone Encounter (Signed)
Sent Nexium refill per faxed form from [facility] pharmacy.

## 2011-01-07 NOTE — Op Note (Signed)
[place]  Patient:    [name], [name]                        MRN: [id number] Proc. Date: 01/24/00 Attending:  Velora Heckler, M.D. CC:         Rosalyn Gess. Norins, M.D. [facility]                           Operative Report  PREOPERATIVE DIAGNOSIS:  Reducible right inguinal hernia.  POSTOPERATIVE DIAGNOSIS:  Reducible right inguinal hernia.  OPERATION PERFORMED:  Repair of right inguinal hernia with Prolene mesh.  SURGEON:  Velora Heckler, M.D.  ANESTHESIA:  General.  ANESTHESIOLOGIST:  Cliffton Asters. Ivin Booty, M.D.  ESTIMATED BLOOD LOSS:  Minimal.  PREPARATION:  Betadine.  COMPLICATIONS:  None.  INDICATIONS FOR PROCEDURE:  The patient is a 77 year old white male presented with reducible right inguinal hernia.  He now comes to surgery for elective repair.  DESCRIPTION OF PROCEDURE:  The procedure was done in operating room #1 at the [facility].  The patient was brought to the operating room and placed in supine position on the operating table.  Following administration of general anesthesia, the patient was prepped and draped in the usual strict aseptic fashion.  After ascertaining that an adequate level of anesthesia had been obtained, a right inguinal incision was made with a #15 blade.  Dissection was carried down through the subcutaneous tissues and hemostasis obtained with the electrocautery.  The external oblique fascia was incised in line with its fibers and extended through the external inguinal ring.  A large direct inguinal hernia was present.  Cord structures were encircled with a Penrose drain.  The floor of the inguinal canal was defined. Direct inguinal hernia was dissected away from from the cord structures.  It was reduced and held in reduction with interrupted 3-0 Vicryl sutures.  There is no evidence of indirect inguinal hernia.  Next, the floor of the inguinal canal was created with a sheet of Prolene mesh.  The  mesh was secured to the pubic tubercle and along the inguinal ligament with a running 2-0 Novofil suture.  The mesh was split to accommodate the cord structures.  The superior margin of the mesh was secured to the transversalis and internal oblique fascia with interrupted 2-0 Novofil sutures.  The tails of the mesh were overlapped and the inferior edges of the tails of the mesh were secured to the inguinal ligament with a single interrupted 2-0 Novofil suture placed lateral to the cord structures.  Good hemostasis was noted.  Field block was placed with local Marcaine anesthetic.  Cord structures are returned to the inguinal canal.  External oblique fascia was closed with interrupted 3-0 Vicryl sutures.  Subcutaneous tissues were reapproximated with interrupted 3-0 Vicryl sutures.  Skin edges were anesthetized with local Marcaine anesthetic.  Skin edges were reapproximated with interrupted 4-0 Vicryl subcuticular sutures. The wound was washed and dried and benzoin and Steri-Strips were applied. Sterile gauze dressings were applied.  The patient was awakened from anesthesia and brought to the recovery room in stable condition.  The patient tolerated the procedure well. DD:  01/24/00 TD:  01/27/00 Job: [id number]

## 2011-01-07 NOTE — H&P (Signed)
NAME:  [name], [name] NO.:  [id number]   MEDICAL RECORD NO.:  [id number]          PATIENT TYPE:  AMB   LOCATION:  ENDO                         FACILITY:  [facility]   PHYSICIAN:  Malcolm T. Russella Dar, M.D. [facility] OF BIRTH:  [birth year]   DATE OF ADMISSION:  10/11/2005  DATE OF DISCHARGE:  10/11/2005                                HISTORY & PHYSICAL   PROCEDURE:  Ambulatory 24 hour pH study.   INDICATIONS FOR PROCEDURE:  A 77 year old white male with chronic GERD and  Barrett's esophagus.  He has had ongoing throat discomfort and has been  evaluated by Dr. Serena Colonel.  Laryngopharyngeal reflux is suspected.  This  study is being performed while on Omeprazole 40 mg p.o. b.i.d. to assess  efficacy of this therapy.   RESULTS:  Proximal channel, 0.2% of the time below pH 4.  No episodes of  reflux longer than five minutes.  The longest episode was one minute.   In the distal channel, 2.5% of the time was below pH 4.  Total time below pH  4, 14 minutes, longest episode of reflux six minutes.  The distal channel  composite score was 6.4, with a normal less than 22.  Symptom index  correlation was fairly unrevealing.   IMPRESSION:  Adequate acid suppression on his current regimen.  No  significant proximal acid reflux was demonstrated on this study.   RECOMMENDATIONS:  Continue Omeprazole 40 mg p.o. b.i.d., along with all  standard anti-reflux measures.  Followup office visit with me in three  months.      Venita Lick. Russella Dar, M.D. [facility]  Electronically Signed     MTS/MEDQ  D:  10/27/2005  T:  10/27/2005  Job:  [id number]   cc:   Jeannett Senior. Pollyann Kennedy, MD  Fax: [phone]

## 2011-01-07 NOTE — Letter (Signed)
April 12, 2006     Venita Lick. Russella Dar, MD, FACG  [address]  [place],  [place].  [address]   RE:  YING, ROCKS  MRN:  [id number]  /  DOB:  02-Feb-1926   Dear Judie Petit,   Thank you very much for seeing Mr. Dell Briner in followup evaluation and  exam.  I believe you saw him last in 2004.  At that time, he was having low  back and lower abdominal, perirectal pain.  We were working him up for  chronic prostatitis at the time, and at that point you deferred doing a  colonoscopy because he had not hit the 10-year interval from his last study.   In the interval since 2004, the patient has continued to have recurrent pain  and problems in the low back, lower abdominal, and perineal regions.  As you  will see in my note, he has been evaluated by Dr. Vic Blackbird who does  not feel this was a genitourinary problem.   Patient recently was seen by Dr. Alphonsus Sias and was treated for recurrent  prostatitis with 24 days of ciprofloxacin, which never really relieved the  patient's discomfort.  He presented to he office today with ongoing pian and  discomfort.  My accompanying note does dictate his examination.   At this point, I think it would be worthwhile to consider repeat  colonoscopy.  He is 10 years out from his last study.  Particularly  concerned as whether he would have a rectosigmoid lesion that could be  causing him to have this pain and discomfort.  As you will note, I have  ordered a urinalysis and some lumbar/sacral spine films as well to look at  other problems for his pain and discomfort.  Lastly, I have started him on  Septra just to be sure this is not an infected process.   I appreciate your help in evaluating Mr. Calvillo and look forward to hearing  from you    Sincerely,      Rosalyn Gess. Norins, MD   MEN/MedQ  DD:  04/12/2006  DT:  04/13/2006  Job #:  [id number]

## 2011-01-07 NOTE — Assessment & Plan Note (Signed)
[facility]                           GASTROENTEROLOGY OFFICE NOTE   NAME:[name], [name]                         MRN:          [id number]  DATE:04/27/2006                            DOB:          1925-09-09    Mr. Maalouf returns today for GERD complicated by Barrett's esophagus.  His  reflux symptoms are well-controlled on Omeprazole 20 mg p.o. b.i.d.  He  relates no dysphagia, odynophagia, or gastrointestinal bleeding.  He  underwent colonoscopy in August of 2006 which showed diverticulosis and  internal hemorrhoids.  His last upper endoscopy was in February of 2005  which showed a short segment of Barrett's esophagus and a hiatal hernia.  He  has a history of von Willebrand's syndrome diagnosed many years at [facility] [place] [facility]. He relates no unusual bleeding problems.   CURRENT MEDICATIONS:  Listed on the chart updated and reviewed.   MEDICATION ALLERGIES:  NONE KNOWN.   PHYSICAL EXAMINATION:  In no acute distress.  Weight 166.8 pounds, blood  pressure is 112/70, pulse 64 and regular.  HEENT:  Anicteric sclerae.  Oropharynx clear.  NECK:  Without thyromegaly or adenopathy appreciated.  CHEST:  Clear to auscultation bilaterally.  CARDIAC:  Regular rate and rhythm without murmurs.  ABDOMEN:  Soft, nontender, nondistended. Normal active bowel sounds.  No  palpable masses or organomegaly.  He has a small umbilical hernia that is  easily reduced.  NEUROLOGIC:  Alert and oriented x3.  Grossly nonfocal.   ASSESSMENT/PLAN:  GERD complicated by Barrett's esophagus.  He is due for  surveillance endoscopy.  Plan to proceed with a hematology referral to  reassess his von Willebrand's disease and assess the risk of endoscopy with  biopsy.  Maintain standard antireflux measures and his current dose of  Omeprazole.                                   Venita Lick. Russella Dar, MD, [facility]   MTS/MedQ  DD:  05/04/2006  DT:  05/04/2006  Job #:  [id number]

## 2011-01-07 NOTE — Assessment & Plan Note (Signed)
[facility]                             [place] OFFICE NOTE   NAME:[name], [name]                         MRN:          [id number]  DATE:03/29/2006                            DOB:          07/19/1926    [name] is a 77 year old gentleman who presents for followup evaluation  and exam.  He is followed for hypertension, gastritis, hemorrhoids,  hyperlipidemia.  He was last seen in the office by Dr. Tillman Abide March 20, 2006 when he presented with back pain, and scrotal pain.  Was diagnosed  as having significant prostatitis and was started on Cipro 500 mg b.i.d. for  24 days.  The patient reports he has already seen improvement in  his  symptoms but does continue to have sacral back pain.   The patient had been troubled by phlegm and sore throat.  He had a thorough  evaluation by Dr. Pollyann Kennedy of ENT and it was found that the persistent symptoms  were related to Prevacid.  After discontinuation of Prevacid, his symptoms  did improve and he has done well on Prilosec.   PAST MEDICAL HISTORY:  Well documented in my note of Jan 08, 2005.   CURRENT MEDICATIONS:  1.  Lovastatin 40 mg daily.  2.  Norvasc 10 mg daily.  3.  Aspirin 81 mg daily.  4.  HCTZ 25 mg daily.  5.  Prilosec 20 mg b.i.d.  6.  Tylenol p.r.n.   REVIEW OF SYSTEMS:  The patient has had no constitutional, cardiovascular,  respiratory, GI or GU complaints.   PHYSICAL EXAMINATION:  VITAL SIGNS:  Temperature was 97.1, blood pressure  146/78, pulse 72, weight 169, height 67-1/4 inches.  GENERAL APPEARANCE:  A well-nourished, well-developed gentleman in no acute  distress.  HEENT:  Normocephalic, atraumatic.  TMs normal.  Patient is wearing an upper  denture.  He is edentulous on the mandible.  No buccal lesions were noted.  Posterior pharynx was clear.  Conjunctivae and sclerae were clear.  Pupils  equal, round and reactive to light and accommodation.  NECK:  Supple without  thyromegaly.  No lymphadenopathy was noted in the  cervical, supraclavicular regions.  CHEST:  No CVA tenderness.  LUNGS:  Clear to auscultation and percussion.  CARDIOVASCULAR:  There were 2+ radial pulses, no JVD or carotid bruits.  He  had a quiet precordium with regular rate and rhythm without murmurs, rubs,  or gallops.  ABDOMEN:  Soft, no guarding or rebound.  No hepatosplenomegaly was noted.  GENITAL AND RECTAL:  Exams deferred to exam just performed on March 20, 2006.  EXTREMITIES:  Without clubbing, cyanosis or edema.  No deformities are  noted.   LABORATORY:  Ordered and pending; includes a lipid profile, AST, ALT  and  basic metabolic panel.  PSA is not ordered at this time because of altered  values secondary  to inflammation/prostatitis.   ASSESSMENT AND PLAN:  1.  Hypertension.  The patient is adequately controlled on his present      medical regimen.  He will continue the same.  2.  Gastritis/dyspepsia/hoarseness and phlegm, currently stable and      resolved.  The patient will continue on Prilosec 20 mg b.i.d.  3.  Lipids.  The patient has a lipid panel pending.  Fasting lipid panel      from December 14,2006 revealed an LDL of 109.   PLAN:  1.  Will be continued on his present medications.  2.  Health maintenance.  The patient is a candidate for colonoscopy.  Also      he is a candidate for repeat esophagogastroduodenoscopy, surveillance      for Barrett's esophagus.  The patient is followed by Dr.  Claudette Head      and referral back to Dr. Russella Dar will be made.   In summary, a very pleasant gentleman who seems  to be medically stable at  this time.  Will set him up for referral as noted.  He will return to see me  either at the  [facility] office or he will return to a new physician at  [facility].                                   Rosalyn Gess Norins, MD   MEN/MedQ  DD:  03/29/2006  DT:  03/29/2006  Job #:  [id number]   cc:   [name]

## 2011-01-07 NOTE — Assessment & Plan Note (Signed)
[facility]                             PRIMARY CARE OFFICE NOTE   NAME:Spiker, VIRAJ LIBY                         MRN:          [id number]  DATE:04/12/2006                            DOB:          1926/03/28    Mr. Maqueda is a 77 year old gentleman who is being evaluated for recurrent  low back, lower abdominal, and perineal pain.  This has been a longstanding  problem.  Patient was treated for several episodes of what was thought to be  prostatitis over the last year and a half.  He did see Dr. Vic Blackbird  for evaluation in June of 2006.  At that time, Dr. Aldean Ast felt his  prostate was normal, and he was concerned about perineal and rectal pain  being secondary to large internal hemorrhoids.   Patient was seen by Dr. Tillman Abide March 20, 2006, was diagnosed as  having a moderately tender prostate gland and was started on Cipro 500 mg  b.i.d. for 24 days.  Patient initially had some improvement in his  discomfort, but then his discomfort returned.  He is now finished with the  antibiotics and continues to have significant pain.  He reports that he does  well when lying down, but when he is sitting or standing, he will have  intermittent problems with low back pain, scrotal pain, lower abdominal  pain, and pain in the perineum.  He did have a drug holiday off of  Lovastatin, and this did not improve his symptoms.   Chart review of patient's notes from Dr. Aldean Ast is noted.  Patient did  have a CT scan of the abdomen and pelvis in December of 2004, which was  really unremarkable except for a question of a cyst in the right kidney  lower pole.  A CT scan of the pelvis showed small left inguinal hernia, but  no signs of __________ bowel was noted.  Patient's last colonoscopy was  several years ago, which I believe was 69.  The patient did see Dr. Russella Dar  in 2004, and at that time, Dr. Russella Dar wanted the patient's lower abdominal  pain  evaluated prior to considering a repeat colonoscopy since he would not  be due until 2007.   CURRENT MEDICATIONS:  1. Lovastatin 40 mg daily.  2. Norvasc 10 mg daily.  3. Aspirin daily.  4. Hydrochlorothiazide 25 mg daily.  5. Prilosec 20 mg b.i.d.   REVIEW OF SYMPTOMS:  Patient has had no fevers, sweats, or chills.  No  cardiovascular, respiratory, or gastrointestinal problem, specifically  denying any diarrhea, hematochezia, or hematemesis.   EXAMINATION:  VITAL SIGNS:  Temperature was 98, blood pressure 120/60, pulse  was 76, weight is 167.  GENERAL APPEARANCE:  Slender gentleman looking younger than his stated  chronological age, in no acute distress.  BACK EXAM:  Patient can stand without assistance.  He could flex at the  waist without discomfort.  He was able to step up to the exam table without  problems.  He had 2+ deep tendon reflexes at the patellar tendon.  He was  able to do a straight leg maneuver without difficulty.  Patient did have  tenderness to percussion over the lower lumbar and sacral region.  ABDOMEN:  Patient had positive bowel sounds.  There was no guarding or  rebound.  He did have tenderness to palpation in the right lower quadrant,  not the left.  GENITALIA EXAM:  Patient had no tenderness in the scrotum or on palpation of  the testicles.  He had no tenderness to palpation of the perineum.  RECTAL EXAM:  Patient had normal sphincter tone.  He found the exam to be  very uncomfortable, particularly with massage across the base of the rectum  versus prostate gland.   ASSESSMENT/PLAN:  Persistent low back, lower abdominal, and scrotal pain.  I  did review Dr. Valda Lamb note in detail, particularly his feeling that  there was no tenderness to the prostate, but actually to the rectal shelf  and concern for a gastrointestinal cause for his discomfort.  Patient does  not have any low back x-rays in our chart, but his wife reports he  definitely did have back  x-rays in the past which showed some osteopathic-  type change.  At this point in time, it is unclear what the origin of the  patient's pain is.  I am still suspicious for prostatitis, but he is  afebrile which rules against.  He also has completed a 24-day course of  ciprofloxacin, which rules against.   PLAN:  1. We will take a post prostate massage urinalysis today.  2. Lumbar spine films looking for any degenerative disk disease or      osteopathic changes, and if there are positive changes, will then      recommend MRI scan.  Will set the patient back up to see Dr. Claudette Head to help rule out any source of pain being of gastrointestinal      origin, such as internal hemorrhoids or other rectal problems.  Will      not do any abdominal or pelvic imaging given his recent study in 2004,      which was unremarkable.  Will give the patient a trial of Septra DS      b.i.d. to see if this helps relieve his discomfort, although I believe      at this point, prostatitis is not the primary leading diagnosis.   Patient will be notified as to the results of his urinalysis and also his  lumbar sacral spine films.  We will set him up to see Dr. Russella Dar as noted.                                   Rosalyn Gess Norins, MD   MEN/MedQ  DD:  04/12/2006  DT:  04/13/2006  Job #:  [id number]   cc:   Venita Lick. Pleas Koch., MD, Clement[facility] Graham

## 2011-01-12 ENCOUNTER — Ambulatory Visit: Payer: Self-pay | Admitting: Gastroenterology

## 2011-01-24 ENCOUNTER — Encounter: Payer: Self-pay | Admitting: Gastroenterology

## 2011-01-25 ENCOUNTER — Ambulatory Visit: Payer: Self-pay | Admitting: Gastroenterology

## 2011-01-27 ENCOUNTER — Ambulatory Visit (INDEPENDENT_AMBULATORY_CARE_PROVIDER_SITE_OTHER): Payer: Medicare Other | Admitting: Gastroenterology

## 2011-01-27 ENCOUNTER — Encounter: Payer: Self-pay | Admitting: Gastroenterology

## 2011-01-27 VITALS — BP 132/68 | HR 72 | Ht 68.0 in | Wt 168.6 lb

## 2011-01-27 DIAGNOSIS — K2270 Barrett's esophagus without dysplasia: Secondary | ICD-10-CM

## 2011-01-27 MED ORDER — PANTOPRAZOLE SODIUM 40 MG PO TBEC: 40.0000 mg | DELAYED_RELEASE_TABLET | Freq: Two times a day (BID) | ORAL | Status: DC

## 2011-01-27 NOTE — Patient Instructions (Signed)
Your prescription for pantoprazole has been printed and given to you to take to your pharmacy.

## 2011-01-27 NOTE — Progress Notes (Signed)
History of Present Illness: This is an 77 year old male with Barrett's returning for followup with his wife. He notes occasional "phlegm in his throat". He has no typical reflux symptoms. He would to change to a less expensive PPI. He denies dysphagia, odynophagia, weight loss, nausea, vomiting, abdominal pain, melena, hematochezia.  Current Medications, Allergies, Past Medical History, Past Surgical History, Family History and Social History were reviewed in [employer] record.  Physical Exam: General: Well developed , well nourished, no acute distress Head: Normocephalic and atraumatic Eyes:  sclerae anicteric, EOMI Ears: Normal auditory acuity Mouth: No deformity or lesions Lungs: Clear throughout to auscultation Heart: Regular rate and rhythm; no murmurs, rubs or bruits Abdomen: Soft, non tender and non distended. No masses, hepatosplenomegaly or hernias noted. Normal Bowel sounds Musculoskeletal: Symmetrical with no gross deformities  Pulses:  Normal pulses noted Extremities: No clubbing, cyanosis, edema or deformities noted Neurological: Alert oriented x 4, grossly nonfocal Psychological:  Alert and cooperative. Normal mood and affect  Assessment and Recommendations:  1. Barrett's esophagus. The patient requested change to pantoprazole. Begin pantoprazole 40 mg twice daily. Continue all standard antireflux measures. If he feels his reflux symptoms are not adequately controlled he will call back and we will try another PPI. Given his age and lack of dysplasia noted in Barrett's we will not plan for future surveillance endoscopies. He is comfortable with this plan. Ongoing followup with his primary physician. I will see back as needed.

## 2011-02-28 ENCOUNTER — Encounter: Payer: Self-pay | Admitting: *Deleted

## 2011-03-01 ENCOUNTER — Other Ambulatory Visit (INDEPENDENT_AMBULATORY_CARE_PROVIDER_SITE_OTHER): Payer: Medicare Other

## 2011-03-01 ENCOUNTER — Other Ambulatory Visit: Payer: Self-pay | Admitting: Internal Medicine

## 2011-03-01 ENCOUNTER — Ambulatory Visit (INDEPENDENT_AMBULATORY_CARE_PROVIDER_SITE_OTHER): Payer: Medicare Other | Admitting: Internal Medicine

## 2011-03-01 ENCOUNTER — Encounter: Payer: Self-pay | Admitting: Internal Medicine

## 2011-03-01 DIAGNOSIS — E785 Hyperlipidemia, unspecified: Secondary | ICD-10-CM

## 2011-03-01 DIAGNOSIS — I10 Essential (primary) hypertension: Secondary | ICD-10-CM

## 2011-03-01 DIAGNOSIS — K219 Gastro-esophageal reflux disease without esophagitis: Secondary | ICD-10-CM

## 2011-03-01 LAB — COMPREHENSIVE METABOLIC PANEL
ALT: 22 U/L (ref 0–53)
AST: 25 U/L (ref 0–37)
Albumin: 4.4 g/dL (ref 3.5–5.2)
Alkaline Phosphatase: 68 U/L (ref 39–117)
BUN: 19 mg/dL (ref 6–23)
CO2: 26 mEq/L (ref 19–32)
Calcium: 9 mg/dL (ref 8.4–10.5)
Chloride: 105 mEq/L (ref 96–112)
Creatinine, Ser: 1.1 mg/dL (ref 0.4–1.5)
GFR: 71.63 mL/min (ref >60.00)
Glucose, Bld: 101 mg/dL — ABNORMAL HIGH (ref 70–99)
Potassium: 4 mEq/L (ref 3.5–5.1)
Sodium: 140 mEq/L (ref 135–145)
Total Bilirubin: 0.6 mg/dL (ref 0.3–1.2)
Total Protein: 7.6 g/dL (ref 6.0–8.3)

## 2011-03-01 LAB — LIPID PANEL
Cholesterol: 244 mg/dL — ABNORMAL HIGH (ref 0–200)
HDL: 49 mg/dL (ref >39.00)
Total CHOL/HDL Ratio: 5
Triglycerides: 188 mg/dL — ABNORMAL HIGH (ref 0.0–149.0)
VLDL: 37.6 mg/dL (ref 0.0–40.0)

## 2011-03-01 LAB — LDL CHOLESTEROL, DIRECT: Direct LDL: 174.2 mg/dL

## 2011-03-01 NOTE — Progress Notes (Signed)
  Subjective:    Patient ID: [name], male    DOB: [birth year], 77 y.o.   MRN: [id number]  HPI [name] returns for follow-up. In the interval since his last visit he has seen Dr. Russella Dar for GI follow-up, but he has not had any procedures. He has been having intra-ocular injections by Dr. Caprice Kluver.  He is interested in having his cholesterol rechecked: last panel done in Dec '11 with LDL 148.   He is feeling well and remains active. HIs wife vouches for his activity level.   I have reviewed the patient's medical history in detail and updated the computerized patient record.   Review of Systems Review of Systems  Constitutional:  Negative for fever, chills, activity change and unexpected weight change.  HEENT:  Positive for hearing loss,negative for ear pain, congestion, neck stiffness and postnasal drip. Negative for sore throat or swallowing problems. Negative for dental complaints.   Eyes: Negative for vision loss or change in visual acuity.  Respiratory: Negative for chest tightness and wheezing.   Cardiovascular: Negative for chest pain and palpitation. No decreased exercise tolerance Gastrointestinal: No change in bowel habit. No bloating or gas.  reflux or indigestion is better but still an issue Genitourinary: Negative for urgency, frequency, flank pain and difficulty urinating.  Musculoskeletal: Negative for myalgias, back pain, arthralgias and gait problem.  Neurological: Negative for dizziness, tremors, weakness and headaches.  Hematological: Negative for adenopathy.  Psychiatric/Behavioral: Negative for behavioral problems and dysphoric mood.       Objective:   Physical Exam Vitals reviewed - stable BP Gen'l - spry, well nourished older white male in no distress - his usual avuncular self. HEENT - left lid is lower, conjunctiva appears dark Pulmonary - normal respiration Cor - 2+ radial pulses, RRR Neuro - A&O x 3 , MS 5/5, gait normal       Assessment & Plan:

## 2011-03-02 NOTE — Assessment & Plan Note (Signed)
Under the care of Dr. Russella Dar. Doing ok on his present regimen.

## 2011-03-02 NOTE — Assessment & Plan Note (Signed)
BP Readings from Last 3 Encounters:  03/01/11 132/70  01/27/11 132/68  07/29/10 132/64   Adequate control on his present regimen

## 2011-03-02 NOTE — Assessment & Plan Note (Signed)
Follow-up lipid panel - LDL 174.2  Plan - will discuss adherence with the patient before changing medication.

## 2011-03-07 ENCOUNTER — Encounter: Payer: Self-pay | Admitting: Internal Medicine

## 2011-03-12 ENCOUNTER — Emergency Department (HOSPITAL_COMMUNITY)
Admission: EM | Admit: 2011-03-12 | Discharge: 2011-03-12 | Disposition: A | Discharge status: Home / Self Care | Payer: Medicare Other | Attending: Emergency Medicine | Admitting: Emergency Medicine

## 2011-03-12 DIAGNOSIS — K219 Gastro-esophageal reflux disease without esophagitis: Secondary | ICD-10-CM | POA: Insufficient documentation

## 2011-03-12 DIAGNOSIS — I10 Essential (primary) hypertension: Secondary | ICD-10-CM | POA: Insufficient documentation

## 2011-03-12 DIAGNOSIS — R61 Generalized hyperhidrosis: Secondary | ICD-10-CM | POA: Insufficient documentation

## 2011-03-12 DIAGNOSIS — R42 Dizziness and giddiness: Secondary | ICD-10-CM | POA: Insufficient documentation

## 2011-03-12 DIAGNOSIS — T63461A Toxic effect of venom of wasps, accidental (unintentional), initial encounter: Secondary | ICD-10-CM | POA: Insufficient documentation

## 2011-03-12 DIAGNOSIS — T6391XA Toxic effect of contact with unspecified venomous animal, accidental (unintentional), initial encounter: Secondary | ICD-10-CM | POA: Insufficient documentation

## 2011-03-12 DIAGNOSIS — H3530 Unspecified macular degeneration: Secondary | ICD-10-CM | POA: Insufficient documentation

## 2011-04-26 ENCOUNTER — Emergency Department (HOSPITAL_COMMUNITY)
Admission: EM | Admit: 2011-04-26 | Discharge: 2011-04-26 | Disposition: A | Discharge status: Home / Self Care | Payer: Medicare Other | Attending: Emergency Medicine | Admitting: Emergency Medicine

## 2011-04-26 DIAGNOSIS — R197 Diarrhea, unspecified: Secondary | ICD-10-CM | POA: Insufficient documentation

## 2011-04-26 DIAGNOSIS — K219 Gastro-esophageal reflux disease without esophagitis: Secondary | ICD-10-CM | POA: Insufficient documentation

## 2011-04-26 DIAGNOSIS — I10 Essential (primary) hypertension: Secondary | ICD-10-CM | POA: Insufficient documentation

## 2011-04-26 DIAGNOSIS — R109 Unspecified abdominal pain: Secondary | ICD-10-CM | POA: Insufficient documentation

## 2011-04-26 DIAGNOSIS — D72829 Elevated white blood cell count, unspecified: Secondary | ICD-10-CM | POA: Insufficient documentation

## 2011-04-26 LAB — BASIC METABOLIC PANEL
BUN: 17 mg/dL (ref 6–23)
CO2: 25 mEq/L (ref 19–32)
Calcium: 9.2 mg/dL (ref 8.4–10.5)
Chloride: 99 mEq/L (ref 96–112)
Creatinine, Ser: 1.13 mg/dL (ref 0.50–1.35)
GFR calc Af Amer: >60 mL/min (ref >60)
GFR calc non Af Amer: >60 mL/min (ref >60)
Glucose, Bld: 128 mg/dL — ABNORMAL HIGH (ref 70–99)
Potassium: 3.3 mEq/L — ABNORMAL LOW (ref 3.5–5.1)
Sodium: 137 mEq/L (ref 135–145)

## 2011-04-26 LAB — CBC
HCT: 44.1 % (ref 39.0–52.0)
Hemoglobin: 15.1 g/dL (ref 13.0–17.0)
MCH: 28.6 pg (ref 26.0–34.0)
MCHC: 34.2 g/dL (ref 30.0–36.0)
MCV: 83.5 fL (ref 78.0–100.0)
Platelets: 182 10*3/uL (ref 150–400)
RBC: 5.28 MIL/uL (ref 4.22–5.81)
RDW: 13.4 % (ref 11.5–15.5)
WBC: 18 10*3/uL — ABNORMAL HIGH (ref 4.0–10.5)

## 2011-04-26 LAB — GLUCOSE, CAPILLARY: Glucose-Capillary: 120 mg/dL — ABNORMAL HIGH (ref 70–99)

## 2011-04-29 ENCOUNTER — Ambulatory Visit (INDEPENDENT_AMBULATORY_CARE_PROVIDER_SITE_OTHER): Payer: Medicare Other | Admitting: Internal Medicine

## 2011-04-29 ENCOUNTER — Other Ambulatory Visit (INDEPENDENT_AMBULATORY_CARE_PROVIDER_SITE_OTHER): Payer: Medicare Other

## 2011-04-29 VITALS — BP 118/76 | HR 81 | Temp 98.3°F | Wt 161.0 lb

## 2011-04-29 DIAGNOSIS — D72829 Elevated white blood cell count, unspecified: Secondary | ICD-10-CM

## 2011-04-29 DIAGNOSIS — A088 Other specified intestinal infections: Secondary | ICD-10-CM

## 2011-04-29 DIAGNOSIS — A084 Viral intestinal infection, unspecified: Secondary | ICD-10-CM

## 2011-04-29 LAB — CBC WITH DIFFERENTIAL/PLATELET
Basophils Absolute: 0 10*3/uL (ref 0.0–0.1)
Basophils Relative: 0.2 % (ref 0.0–3.0)
Eosinophils Absolute: 0.1 10*3/uL (ref 0.0–0.7)
Eosinophils Relative: 1.4 % (ref 0.0–5.0)
HCT: 44.5 % (ref 39.0–52.0)
Hemoglobin: 14.9 g/dL (ref 13.0–17.0)
Lymphocytes Relative: 21.3 % (ref 12.0–46.0)
Lymphs Abs: 1.3 10*3/uL (ref 0.7–4.0)
MCHC: 33.6 g/dL (ref 30.0–36.0)
MCV: 84.8 fl (ref 78.0–100.0)
Monocytes Absolute: 1.1 10*3/uL — ABNORMAL HIGH (ref 0.1–1.0)
Monocytes Relative: 17.1 % — ABNORMAL HIGH (ref 3.0–12.0)
Neutro Abs: 3.8 10*3/uL (ref 1.4–7.7)
Neutrophils Relative %: 60 % (ref 43.0–77.0)
Platelets: 251 10*3/uL (ref 150.0–400.0)
RBC: 5.25 Mil/uL (ref 4.22–5.81)
RDW: 13.7 % (ref 11.5–14.6)
WBC: 6.3 10*3/uL (ref 4.5–10.5)

## 2011-04-29 MED ORDER — GEMFIBROZIL 600 MG PO TABS: 600.0000 mg | ORAL_TABLET | Freq: Two times a day (BID) | ORAL | Status: DC

## 2011-05-01 NOTE — Progress Notes (Signed)
  Subjective:    Patient ID: [name], male    DOB: [birth year], 77 y.o.   MRN: [id number]  HPI [name], [name] known to the practice, was seen in ED Sept 4th for a several day h/o diarrhea w/ abdominal pain. ED note reviewed. His examination was unremarkable, lab revealed leukocytosis to 18,000, BMet was normal. Pt was better after hydration 1L NS IV. In the interval he has been doing OK - no recurrent diarrhea or abdominal pain.  I have reviewed the patient's medical history in detail and updated the computerized patient record.    Review of Systems System review is negative for any constitutional, cardiac, pulmonary, GI or neuro symptoms or complaints except as noted in the HPI     Objective:   Physical Exam Vitals reviewed - normal Gen'l - wiry elderly white male, HOH, in no distress HEENT - normal Resp- normal Cor - RRR Abdomen - BS+ x 4, no HSM, no tenderness       Assessment & Plan:  Gastroenteritis - most likely viral now resolved.  Plan - f/u CBC  Addendum - WBC 6.3

## 2011-05-02 ENCOUNTER — Telehealth: Payer: Self-pay | Admitting: Internal Medicine

## 2011-05-02 NOTE — Telephone Encounter (Signed)
Call patinet - white count is normal at 6.5. No furhter testing needed. Thanks

## 2011-05-02 NOTE — Telephone Encounter (Signed)
Informed pt .

## 2011-06-07 ENCOUNTER — Ambulatory Visit (INDEPENDENT_AMBULATORY_CARE_PROVIDER_SITE_OTHER): Payer: Medicare Other | Admitting: *Deleted

## 2011-06-07 DIAGNOSIS — Z23 Encounter for immunization: Secondary | ICD-10-CM

## 2011-08-11 ENCOUNTER — Other Ambulatory Visit: Payer: Self-pay | Admitting: Internal Medicine

## 2011-08-11 ENCOUNTER — Other Ambulatory Visit (INDEPENDENT_AMBULATORY_CARE_PROVIDER_SITE_OTHER): Payer: Medicare Other

## 2011-08-11 ENCOUNTER — Encounter: Payer: Self-pay | Admitting: Internal Medicine

## 2011-08-11 ENCOUNTER — Ambulatory Visit (INDEPENDENT_AMBULATORY_CARE_PROVIDER_SITE_OTHER): Payer: Medicare Other | Admitting: Internal Medicine

## 2011-08-11 DIAGNOSIS — H9190 Unspecified hearing loss, unspecified ear: Secondary | ICD-10-CM

## 2011-08-11 DIAGNOSIS — M2550 Pain in unspecified joint: Secondary | ICD-10-CM

## 2011-08-11 DIAGNOSIS — K219 Gastro-esophageal reflux disease without esophagitis: Secondary | ICD-10-CM

## 2011-08-11 DIAGNOSIS — K2270 Barrett's esophagus without dysplasia: Secondary | ICD-10-CM

## 2011-08-11 DIAGNOSIS — E785 Hyperlipidemia, unspecified: Secondary | ICD-10-CM

## 2011-08-11 DIAGNOSIS — D680 Von Willebrand's disease: Secondary | ICD-10-CM

## 2011-08-11 DIAGNOSIS — I10 Essential (primary) hypertension: Secondary | ICD-10-CM

## 2011-08-11 DIAGNOSIS — Z0000 Encounter for general adult medical examination without abnormal findings: Secondary | ICD-10-CM

## 2011-08-11 LAB — HEPATIC FUNCTION PANEL
ALT: 16 U/L (ref 0–53)
AST: 26 U/L (ref 0–37)
Albumin: 4.2 g/dL (ref 3.5–5.2)
Alkaline Phosphatase: 95 U/L (ref 39–117)
Bilirubin, Direct: 0.1 mg/dL (ref 0.0–0.3)
Total Bilirubin: 0.7 mg/dL (ref 0.3–1.2)
Total Protein: 7.6 g/dL (ref 6.0–8.3)

## 2011-08-11 LAB — COMPREHENSIVE METABOLIC PANEL
ALT: 16 U/L (ref 0–53)
AST: 26 U/L (ref 0–37)
Albumin: 4.2 g/dL (ref 3.5–5.2)
Alkaline Phosphatase: 95 U/L (ref 39–117)
BUN: 14 mg/dL (ref 6–23)
CO2: 29 mEq/L (ref 19–32)
Calcium: 9.5 mg/dL (ref 8.4–10.5)
Chloride: 104 mEq/L (ref 96–112)
Creatinine, Ser: 1.1 mg/dL (ref 0.4–1.5)
GFR: 66.42 mL/min (ref >60.00)
Glucose, Bld: 96 mg/dL (ref 70–99)
Potassium: 3.8 mEq/L (ref 3.5–5.1)
Sodium: 143 mEq/L (ref 135–145)
Total Bilirubin: 0.7 mg/dL (ref 0.3–1.2)
Total Protein: 7.6 g/dL (ref 6.0–8.3)

## 2011-08-11 LAB — TSH: TSH: 2.96 u[IU]/mL (ref 0.35–5.50)

## 2011-08-11 LAB — LIPID PANEL
Cholesterol: 225 mg/dL — ABNORMAL HIGH (ref 0–200)
HDL: 49 mg/dL (ref >39.00)
Total CHOL/HDL Ratio: 5
Triglycerides: 95 mg/dL (ref 0.0–149.0)
VLDL: 19 mg/dL (ref 0.0–40.0)

## 2011-08-11 LAB — SEDIMENTATION RATE: Sed Rate: 16 mm/hr (ref 0–22)

## 2011-08-11 LAB — LDL CHOLESTEROL, DIRECT: Direct LDL: 149.2 mg/dL

## 2011-08-11 NOTE — Progress Notes (Signed)
Subjective:    Patient ID: [name], male    DOB: 1926-07-18, 77 y.o.   MRN: [id number]  HPI The patient is here for annual Medicare wellness examination and management of other chronic and acute problems.  He does have chronic dyspepsia. He does get some relief with protonix and watching his diet. He does plan to put his bed on a slant.     The risk factors are reflected in the social history.  The roster of all physicians providing medical care to patient - is listed in the Snapshot section of the chart.  Activities of daily living:  The patient is 100% inedpendent in all ADLs: dressing, toileting, feeding as well as independent mobility  Home safety : The patient has smoke detectors in the home. They wear seatbelts.No firearms at home ( firearms are present in the home, kept in a safe fashion). There is no violence in the home.   There is no risks for hepatitis, STDs or HIV. There is no   history of blood transfusion. They have no travel history to infectious disease endemic areas of the world.  The patient has not seen their dentist in the last six month. They have seen their eye doctor in the last year. They admit  hearing difficulty and have not had audiologic testing in the last year. He does wear a hearing aid left. They do not  have excessive sun exposure. Discussed the need for sun protection: hats, long sleeves and use of sunscreen if there is significant sun exposure.  Diet: the importance of a healthy diet is discussed. They do have a healthy diet.  The patient does not have a regular exercise program.  The benefits of regular aerobic exercise were discussed.  Depression screen: there are no signs or vegative symptoms of depression- irritability, change in appetite, anhedonia, sadness/tearfullness.  Cognitive assessment: the patient manages all their financial and personal affairs and is actively engaged. They could relate day,date,year and events; recalled 3/3 objects at 3  minutes; performed clock-face test normally.  The following portions of the patient's history were reviewed and updated as appropriate: allergies, current medications, past family history, past medical history,  past surgical history, past social history  and problem list.  Vision, hearing, body mass index were assessed and reviewed.   During the course of the visit the patient was educated and counseled about appropriate screening and preventive services including : fall prevention , diabetes screening, nutrition counseling, colorectal cancer screening, and recommended immunizations.  Past Medical History  Diagnosis Date  . Barrett esophagus 09/2003  . GERD (gastroesophageal reflux disease)   . Hemorrhoids   . Diverticulosis   . Hyperlipidemia   . Hypertension   . Von Willebrand disease   . PUD (peptic ulcer disease)   . Hearing loss   . GI AVM (gastrointestinal arteriovenous vascular malformation)   . History of prostatitis   . Hemorrhoids    Past Surgical History  Procedure Date  . Inguinal hernia repair 2001   Family History  Problem Relation Age of Onset  . Heart attack Father   . Coronary artery disease Father   . Hypertension Mother   . Pancreatic cancer Mother     Died at 59.  . Cancer Mother     Pancreatic cancer  . Arthritis Sister   . Hyperlipidemia Sister   . Prostate cancer Brother   . Cancer Brother     Bone Cancer secondary to Prostate Cancer  . Diabetes Neg  Hx    History   Social History  . Marital Status: Married    Spouse Name: Alani Lacivita    Number of Children: N/A  . Years of Education: N/A   Occupational History  . Retired   .     Social History Main Topics  . Smoking status: Former Games developer  . Smokeless tobacco: Never Used  . Alcohol Use: No  . Drug Use: No  . Sexually Active: Not on file   Other Topics Concern  . Not on file   Social History Narrative   Married 1952Retired-fulltime. "[employer]"   Current Outpatient Prescriptions  on File Prior to Visit  Medication Sig Dispense Refill  . acetaminophen (TYLENOL) 325 MG tablet Take 325 mg by mouth as needed.        [name] Kitchen amLODipine (NORVASC) 10 MG tablet Take 10 mg by mouth daily.        [name] Kitchen aspirin 81 MG tablet Take 81 mg by mouth daily.        . Bevacizumab (AVASTIN) 100 MG/4ML SOLN Inject into the vein. Inject into right eye as needed       . cetirizine (ZYRTEC) 10 MG tablet Take 10 mg by mouth daily.        [name] Kitchen gabapentin (NEURONTIN) 300 MG capsule Take 300 mg by mouth 3 (three) times daily.        [name] Kitchen gemfibrozil (LOPID) 600 MG tablet Take 1 tablet (600 mg total) by mouth 2 (two) times daily.  60 tablet  11  . Multiple Vitamins-Minerals (OCUVITE ADULT 50+ PO) Take 1 capsule by mouth daily.        . pantoprazole (PROTONIX) 40 MG tablet Take 1 tablet (40 mg total) by mouth 2 (two) times daily.  60 tablet  11  . promethazine (PHENERGAN) 12.5 MG tablet Take 12.5 mg by mouth every 6 (six) hours as needed.             Review of Systems Constitutional:  Negative for fever, chills, activity change and unexpected weight change.  HEENT: Positive for hearing loss, Negative for ear pain, congestion, neck stiffness and postnasal drip. Negative for sore throat or swallowing problems. Negative for dental complaints.   Eyes: Positive for vision loss or change in visual acuity.  Respiratory: Negative for chest tightness and wheezing. Negative for DOE.   Cardiovascular: Negative for chest pain or palpitations.  Decreased exercise tolerance Gastrointestinal: No change in bowel habit. No bloating or gas. No reflux or indigestion Genitourinary: Negative for urgency, frequency, flank pain and difficulty urinating.  Musculoskeletal: Negative for myalgias, back pain, arthralgias and gait problem.  Neurological: Negative for dizziness, tremors, weakness and headaches.  Hematological: Negative for adenopathy.  Psychiatric/Behavioral: Negative for behavioral problems and dysphoric mood.         Objective:   Physical Exam Vitals reviewed - stable Gen'l - WNWD slender white man in no distress HEENT- C&S clear, PERRLA, fundi deferred to opthal. Oropharynx with no oral lesions, throat clear. EAC/TM right OK, hearing in left. Neck- supple, no  Thyromegaly Node- negative cervical, supraclavicular Chest - no deformity Pulm - normal respirations, no rales, wheezes or rhonchi Cor- 2+ radial pulse, RRR Abd- soft, no HSM, normal BS Genitalia/rectal- deferred Extremities- no C/C/E, no deformity, no erythema or synovial thickening small or medium sized joints Neuro - HOH, speech clear, cognition normal, CN II_XII normal, MS normal, normal gait. Derm- no lesions face, neck,chest. Arms  Lab Results  Component Value Date   WBC 6.3 04/29/2011  HGB 14.9 04/29/2011   HCT 44.5 04/29/2011   PLT 251.0 04/29/2011   GLUCOSE 96 08/11/2011   CHOL 225* 08/11/2011   TRIG 95.0 08/11/2011   HDL 49.00 08/11/2011   LDLDIRECT 149.2 08/11/2011        ALT 16 08/11/2011   ALT 16 08/11/2011   AST 26 08/11/2011   AST 26 08/11/2011   NA 143 08/11/2011   K 3.8 08/11/2011   CL 104 08/11/2011   CREATININE 1.1 08/11/2011   BUN 14 08/11/2011   CO2 29 08/11/2011   TSH 2.96 08/11/2011   PSA 2.47 07/27/2009          Assessment & Plan:

## 2011-08-15 DIAGNOSIS — Z0000 Encounter for general adult medical examination without abnormal findings: Secondary | ICD-10-CM | POA: Insufficient documentation

## 2011-08-15 NOTE — Assessment & Plan Note (Signed)
BP Readings from Last 3 Encounters:  08/11/11 136/80  04/29/11 118/76  03/01/11 132/70   Adequate control of blood pressure on present medication.

## 2011-08-15 NOTE — Assessment & Plan Note (Signed)
See Barrett's problem listing

## 2011-08-15 NOTE — Assessment & Plan Note (Signed)
No complaints of reflux or dysphagia. He continues on PPI therapy. NO follow-up EGD scheduled

## 2011-08-15 NOTE — Assessment & Plan Note (Signed)
Interval medical history is unremarkable. Physical exam is normal. Lab results are in normal range except LDL cholesterol. He is current with colorectal cancer screening. His last PSA was normal and he is not a candidate for further screening. Immunizations are up to date except he is due for Shingles vaccine.  In summary - a nice man who is medically stable. He is advised to work on a low fat diet. He will return as needed or in 1 year.

## 2011-08-15 NOTE — Assessment & Plan Note (Signed)
Lab Results  Component Value Date   CHOL 225* 08/11/2011   HDL 49.00 08/11/2011   LDLCALC 100* 06/26/2007   LDLDIRECT 149.2 08/11/2011   TRIG 95.0 08/11/2011   CHOLHDL 5 08/11/2011   On lopid twice a day, had been intolerant of "STatin" durgs. This is below treatment threshold goalof 160 but remains above goal of 130 or less  Plan - continue present medication            Work on dietary adherence to a very low fat diet.

## 2011-08-15 NOTE — Assessment & Plan Note (Signed)
Wearing hearing aid in the left ear. He is doing OK

## 2011-08-19 ENCOUNTER — Telehealth: Payer: Self-pay | Admitting: *Deleted

## 2011-08-19 MED ORDER — GABAPENTIN 300 MG PO CAPS: 300.0000 mg | ORAL_CAPSULE | Freq: Three times a day (TID) | ORAL | Status: DC

## 2011-08-19 NOTE — Telephone Encounter (Signed)
Refill request for Gabapentin 300 mg SIG take 1 capsule by mouth three times a day with qty 90, please Advise refills 

## 2011-08-19 NOTE — Telephone Encounter (Signed)
Ok for refill prn 

## 2011-08-19 NOTE — Telephone Encounter (Signed)
Refill request for Gabapentin 300 mg SIG take 1 capsule by mouth three times a day with qty 90, please Advise refills

## 2011-08-24 ENCOUNTER — Other Ambulatory Visit: Payer: Self-pay | Admitting: *Deleted

## 2011-08-24 MED ORDER — ZOSTER VACCINE LIVE 19400 UNT/0.65ML ~~LOC~~ SOLR: 0.6500 mL | Freq: Once | SUBCUTANEOUS | Status: AC

## 2011-08-26 ENCOUNTER — Other Ambulatory Visit: Payer: Self-pay

## 2011-08-26 MED ORDER — AMLODIPINE BESYLATE 10 MG PO TABS: 10.0000 mg | ORAL_TABLET | Freq: Every day | ORAL | Status: DC

## 2011-12-02 ENCOUNTER — Telehealth: Payer: Self-pay | Admitting: Internal Medicine

## 2011-12-02 NOTE — Telephone Encounter (Signed)
Please forward this to triage at [facility] office as I have been at [facility] all day today and am not sure who is working in your office today.  Thanks/SLS

## 2011-12-02 NOTE — Telephone Encounter (Signed)
Wife requesting gabapentin 300mg --90 tabs[facility]--wife ph# [phone]--wife says pharm have faxed prescription and have not heard from this office

## 2011-12-05 MED ORDER — GABAPENTIN 300 MG PO CAPS: 300.0000 mg | ORAL_CAPSULE | Freq: Three times a day (TID) | ORAL | Status: DC

## 2011-12-05 NOTE — Telephone Encounter (Signed)
Pt's spouse called requesting the status of refill. She was very upset with office that pt had to wait all weekend for refill. Request was sent to [facility] 12/02/11

## 2012-01-17 ENCOUNTER — Ambulatory Visit (INDEPENDENT_AMBULATORY_CARE_PROVIDER_SITE_OTHER): Payer: Medicare Other | Admitting: Internal Medicine

## 2012-01-17 ENCOUNTER — Encounter: Payer: Self-pay | Admitting: Internal Medicine

## 2012-01-17 VITALS — BP 140/70 | HR 83 | Temp 97.5°F | Resp 16 | Wt 163.0 lb

## 2012-01-17 DIAGNOSIS — N3090 Cystitis, unspecified without hematuria: Secondary | ICD-10-CM

## 2012-01-17 DIAGNOSIS — R350 Frequency of micturition: Secondary | ICD-10-CM

## 2012-01-17 MED ORDER — SULFAMETHOXAZOLE-TRIMETHOPRIM 800-160 MG PO TABS: 1.0000 | ORAL_TABLET | Freq: Two times a day (BID) | ORAL | Status: AC

## 2012-01-17 NOTE — Progress Notes (Signed)
  Subjective:    Patient ID: [name], male    DOB: 1925/09/06, 78 y.o.   MRN: [id number]  HPI [name] presents with a several day history of increased urinary frequency and dysuria. He reports that sometimes he only produces a few drop of urine with micturition. He does admit to some bilateral lower abdominal pain. Denies any flank or perineal pain, no fevers of rigors, no hematuria.  He is concerned about prostate cancer: he is reassured that he has not been diagnosed to date and that at 86 years it is a) unlikely b) if he develops prostate cancer it is unlikely to require treatment or pose a threat to his life.  Lab Results  Component Value Date   PSA 2.47 07/27/2009   PSA 2.49 07/07/2008   PSA 2.09 06/26/2007   PMH, FamHx and SocHx reviewed for any changes and relevance.    Review of Systems System review is negative for any constitutional, cardiac, pulmonary, GI or neuro symptoms or complaints other than as described in the HPI.     Objective:   Physical Exam Filed Vitals:   01/17/12 1642  BP: 140/70  Pulse: 83  Temp: 97.5 F (36.4 C)  Resp: 16   Gen'l- elderly white man in no distress HEENT - C&S clear Cor - RRR Pulm - normal respirations Abdomen - BS+ x 4, no guarding or rebound, no tenderness to deep palpation. No tenderness to percussion over the flanks.       Assessment & Plan:  Urinary frequency - cystitis vs prostatitis.  Plan - Septra DS bid x 10 days.

## 2012-01-17 NOTE — Patient Instructions (Signed)
Bladder infection vs a prostate infection. NO RISK of PROSTATE CANCER!  Plan - Septra DS twice a day for 10 days.   Drink a lot of fluids  Tylenol for any fever or pain.  Urinary Tract Infection Infections of the urinary tract can start in several places. A bladder infection (cystitis), a kidney infection (pyelonephritis), and a prostate infection (prostatitis) are different types of urinary tract infections (UTIs). They usually get better if treated with medicines (antibiotics) that kill germs. Take all the medicine until it is gone. You or your child may feel better in a few days, but TAKE ALL MEDICINE or the infection may not respond and may become more difficult to treat. HOME CARE INSTRUCTIONS    Drink enough water and fluids to keep the urine clear or pale yellow. Cranberry juice is especially recommended, in addition to large amounts of water.   Avoid caffeine, tea, and carbonated beverages. They tend to irritate the bladder.   Alcohol may irritate the prostate.   Only take over-the-counter or prescription medicines for pain, discomfort, or fever as directed by your caregiver.  To prevent further infections:  Empty the bladder often. Avoid holding urine for long periods of time.   After a bowel movement, women should cleanse from front to back. Use each tissue only once.   Empty the bladder before and after sexual intercourse.  FINDING OUT THE RESULTS OF YOUR TEST Not all test results are available during your visit. If your or your child's test results are not back during the visit, make an appointment with your caregiver to find out the results. Do not assume everything is normal if you have not heard from your caregiver or the medical facility. It is important for you to follow up on all test results. SEEK MEDICAL CARE IF:    There is back pain.   Your baby is older than 3 months with a rectal temperature of 100.5 F (38.1 C) or higher for more than 1 day.   Your or your  child's problems (symptoms) are no better in 3 days. Return sooner if you or your child is getting worse.  SEEK IMMEDIATE MEDICAL CARE IF:    There is severe back pain or lower abdominal pain.   You or your child develops chills.   You have a fever.   Your baby is older than 3 months with a rectal temperature of 102 F (38.9 C) or higher.   Your baby is 30 months old or younger with a rectal temperature of 100.4 F (38 C) or higher.   There is nausea or vomiting.   There is continued burning or discomfort with urination.  MAKE SURE YOU:    Understand these instructions.   Will watch your condition.   Will get help right away if you are not doing well or get worse.  Document Released: 05/18/2005 Document Revised: 07/28/2011 Document Reviewed: 12/21/2006 [facility] Patient Information 2012 [place], [place].

## 2012-01-27 ENCOUNTER — Other Ambulatory Visit: Payer: Self-pay | Admitting: *Deleted

## 2012-01-27 MED ORDER — PANTOPRAZOLE SODIUM 40 MG PO TBEC: 40.0000 mg | DELAYED_RELEASE_TABLET | Freq: Two times a day (BID) | ORAL | Status: DC

## 2012-02-01 ENCOUNTER — Other Ambulatory Visit: Payer: Self-pay | Admitting: Gastroenterology

## 2012-02-01 MED ORDER — PANTOPRAZOLE SODIUM 40 MG PO TBEC: 40.0000 mg | DELAYED_RELEASE_TABLET | Freq: Two times a day (BID) | ORAL | Status: DC

## 2012-02-01 NOTE — Telephone Encounter (Signed)
One refill sent to patient's pharmacy until patient schedules office visit.

## 2012-02-03 ENCOUNTER — Other Ambulatory Visit: Payer: Self-pay | Admitting: Gastroenterology

## 2012-02-03 MED ORDER — PANTOPRAZOLE SODIUM 40 MG PO TBEC: 40.0000 mg | DELAYED_RELEASE_TABLET | Freq: Two times a day (BID) | ORAL | Status: DC

## 2012-02-03 NOTE — Telephone Encounter (Signed)
Prescription resent to [facility] pharmacy.

## 2012-02-08 ENCOUNTER — Telehealth: Payer: Self-pay | Admitting: Internal Medicine

## 2012-02-08 ENCOUNTER — Encounter: Payer: Self-pay | Admitting: Internal Medicine

## 2012-02-08 ENCOUNTER — Ambulatory Visit (INDEPENDENT_AMBULATORY_CARE_PROVIDER_SITE_OTHER): Payer: Medicare Other | Admitting: Internal Medicine

## 2012-02-08 VITALS — BP 132/80 | HR 80 | Temp 98.3°F | Resp 16 | Ht 69.0 in | Wt 164.0 lb

## 2012-02-08 DIAGNOSIS — H8309 Labyrinthitis, unspecified ear: Secondary | ICD-10-CM

## 2012-02-08 MED ORDER — MECLIZINE HCL 12.5 MG PO TABS: 12.5000 mg | ORAL_TABLET | Freq: Three times a day (TID) | ORAL | Status: AC | PRN

## 2012-02-08 NOTE — Telephone Encounter (Signed)
Wife says pt is having earache -dizziness-roaring head---Request today appt--You and no one else have today availability--Do you desire pt to be worked in?   Thank you for your reply--

## 2012-02-08 NOTE — Telephone Encounter (Signed)
Per sue  sched today  Work in ---gave pt today appt--

## 2012-02-10 NOTE — Progress Notes (Signed)
Subjective:    Patient ID: [name], male    DOB: 03-Jan-1926, 78 y.o.   MRN: [id number]  HPI [name] presents with a several day h/o feeling off balance, dizzy and not himself. He has had no focal neurologic symptoms: no focal weakness, CN abnormalities, loss of sensation, slurred speech, change in cognition. He admits that he has a "sea-sick" kind of feeling.  Past Medical History  Diagnosis Date  . Barrett esophagus 09/2003  . GERD (gastroesophageal reflux disease)   . Hemorrhoids   . Diverticulosis   . Hyperlipidemia   . Hypertension   . Von Willebrand disease   . PUD (peptic ulcer disease)   . Hearing loss   . GI AVM (gastrointestinal arteriovenous vascular malformation)   . History of prostatitis   . Hemorrhoids   . Macular degeneration, wet     receiving intra-ocular injections ([facility])   Past Surgical History  Procedure Date  . Inguinal hernia repair Feb 03, 2000   Family History  Problem Relation Age of Onset  . Heart attack Father   . Coronary artery disease Father   . Hypertension Mother   . Pancreatic cancer Mother     Died at 72.  . Cancer Mother     Pancreatic cancer  . Arthritis Sister   . Hyperlipidemia Sister   . Prostate cancer Brother   . Cancer Brother     Bone Cancer secondary to Prostate Cancer  . Diabetes Neg Hx    History   Social History  . Marital Status: Married    Spouse Name: Yanis Larin    Number of Children: N/A  . Years of Education: N/A   Occupational History  . Retired   .     Social History Main Topics  . Smoking status: Former Games developer  . Smokeless tobacco: Never Used  . Alcohol Use: No  . Drug Use: No  . Sexually Active: Not Currently   Other Topics Concern  . Not on file   Social History Narrative   6th grade. Married 02/03/51 1 son, 1 dtr - died at 5 months. Work - Management consultant, Engineer, drilling, etc.Retired-fulltime. "[employer] ACP - yes DNR, yes short-term mechanical ventilarion, no heroic or futile measures in face  of irreversible.    Current Outpatient Prescriptions on File Prior to Visit  Medication Sig Dispense Refill  . acetaminophen (TYLENOL) 325 MG tablet Take 325 mg by mouth as needed.        [name] Kitchen amLODipine (NORVASC) 10 MG tablet Take 1 tablet (10 mg total) by mouth daily.  30 tablet  5  . aspirin 81 MG tablet Take 81 mg by mouth daily.        . Bevacizumab (AVASTIN) 100 MG/4ML SOLN Inject into the vein. Inject into right eye as needed       . cetirizine (ZYRTEC) 10 MG tablet Take 10 mg by mouth daily.        [name] Kitchen gabapentin (NEURONTIN) 300 MG capsule Take 1 capsule (300 mg total) by mouth 3 (three) times daily.  90 capsule  5  . gemfibrozil (LOPID) 600 MG tablet Take 1 tablet (600 mg total) by mouth 2 (two) times daily.  60 tablet  11  . Multiple Vitamins-Minerals (OCUVITE ADULT 50+ PO) Take 1 capsule by mouth daily.        . pantoprazole (PROTONIX) 40 MG tablet Take 1 tablet (40 mg total) by mouth 2 (two) times daily.  60 tablet  0     Review  of Systems System review is negative for any constitutional, cardiac, pulmonary, GI or neuro symptoms or complaints other than as described in the HPI.     Objective:   Physical Exam Filed Vitals:   02/08/12 1516  BP: 132/80  Pulse: 80  Temp: 98.3 F (36.8 C)  Resp: 16   Gen'l- WNWD white man looking younger than his stated age. HEENT - C&S clear Cor- RRR Pulm - normal respirations Neuro - A&O x 3, speech clear, cognition normal. CN II- XII - normal facial symmetry, tongue w/o fasciculations or deviation, PERRLA, EOMI, normal shoulder shrug; MS 5/5/ equal; cerebellar -  normal rapid finger movement, normal heel-shin maneuver, normal gait, poor tandem gait.       Assessment & Plan:  Labyrinthitis - by history and exam he has dysequilibrium c/w labyrinthitis  Plan Full explanation, including use of anatomy text, given. Patient hand out given  Plan - meclizine 12.5 mg q 6 prn.

## 2012-02-17 ENCOUNTER — Encounter: Payer: Self-pay | Admitting: Gastroenterology

## 2012-02-17 ENCOUNTER — Ambulatory Visit (INDEPENDENT_AMBULATORY_CARE_PROVIDER_SITE_OTHER): Payer: Medicare Other | Admitting: Gastroenterology

## 2012-02-17 VITALS — BP 130/58 | HR 72 | Ht 69.0 in | Wt 165.8 lb

## 2012-02-17 DIAGNOSIS — K2270 Barrett's esophagus without dysplasia: Secondary | ICD-10-CM

## 2012-02-17 MED ORDER — PANTOPRAZOLE SODIUM 40 MG PO TBEC: 40.0000 mg | DELAYED_RELEASE_TABLET | Freq: Two times a day (BID) | ORAL | Status: DC

## 2012-02-17 NOTE — Patient Instructions (Addendum)
You have been scheduled for an endoscopy with propofol. Please follow written instructions given to you at your visit today. We have sent the following medications to your pharmacy for you to pick up at your convenience:Protonix.

## 2012-02-17 NOTE — Progress Notes (Signed)
History of Present Illness: This is an 78 year old male here today with his wife for followup of GERD and Barrett's esophagus. Barrett's region diagnosed in 2005. His last endoscopy in March 2010 did not show Barrett's on biopsy. He states his reflux symptoms are controlled as long as he follows his diet and takes his PPI twice daily. Denies weight loss, abdominal pain, constipation, diarrhea, change in stool caliber, melena, hematochezia, nausea, vomiting, dysphagia, chest pain.   Current Medications, Allergies, Past Medical History, Past Surgical History, Family History and Social History were reviewed in [employer] record.  Physical Exam: General: Well developed , well nourished, no acute distress Head: Normocephalic and atraumatic Eyes:  sclerae anicteric, EOMI Ears: Normal auditory acuity Mouth: No deformity or lesions Lungs: Clear throughout to auscultation Heart: Regular rate and rhythm; no murmurs, rubs or bruits Abdomen: Soft, non tender and non distended. No masses, hepatosplenomegaly or hernias noted. Normal Bowel sounds Musculoskeletal: Symmetrical with no gross deformities  Pulses:  Normal pulses noted Extremities: No clubbing, cyanosis, edema or deformities noted Neurological: Alert oriented x 4, grossly nonfocal Psychological:  Alert and cooperative. Normal mood and affect  Assessment and Recommendations:  1. Barrett's esophagus. Continue standard antireflux measures and pantoprazole 40 mg twice daily. His health status is very good and he is interested in proceeding with surveillance EGD. Schedule EGD. The risks, benefits, and alternatives to endoscopy with possible biopsy and possible dilation were discussed with the patient and they consent to proceed.

## 2012-03-06 ENCOUNTER — Other Ambulatory Visit: Payer: Self-pay

## 2012-03-06 MED ORDER — AMLODIPINE BESYLATE 10 MG PO TABS: 10.0000 mg | ORAL_TABLET | Freq: Every day | ORAL | Status: DC

## 2012-04-09 ENCOUNTER — Telehealth: Payer: Self-pay | Admitting: Internal Medicine

## 2012-04-09 NOTE — Telephone Encounter (Signed)
Received 3 pages from [facility]. 04/09/12/sd

## 2012-04-19 ENCOUNTER — Ambulatory Visit (AMBULATORY_SURGERY_CENTER): Payer: Medicare Other | Admitting: Gastroenterology

## 2012-04-19 ENCOUNTER — Encounter: Payer: Self-pay | Admitting: Gastroenterology

## 2012-04-19 VITALS — BP 151/71 | HR 63 | Temp 96.8°F | Resp 18 | Ht 69.0 in | Wt 165.0 lb

## 2012-04-19 DIAGNOSIS — K209 Esophagitis, unspecified without bleeding: Secondary | ICD-10-CM

## 2012-04-19 DIAGNOSIS — K2270 Barrett's esophagus without dysplasia: Secondary | ICD-10-CM

## 2012-04-19 MED ORDER — SODIUM CHLORIDE 0.9 % IV SOLN: 500.0000 mL | INTRAVENOUS | Status: DC

## 2012-04-19 NOTE — Patient Instructions (Addendum)
YOU HAD AN ENDOSCOPIC PROCEDURE TODAY AT THE [facility]: Refer to the procedure report that was given to you for any specific questions about what was found during the examination.  If the procedure report does not answer your questions, please call your gastroenterologist to clarify.  If you requested that your care partner not be given the details of your procedure findings, then the procedure report has been included in a sealed envelope for you to review at your convenience later.  YOU SHOULD EXPECT: Some feelings of bloating in the abdomen. Passage of more gas than usual.  Walking can help get rid of the air that was put into your GI tract during the procedure and reduce the bloating. If you had a lower endoscopy (such as a colonoscopy or flexible sigmoidoscopy) you may notice spotting of blood in your stool or on the toilet paper. If you underwent a bowel prep for your procedure, then you may not have a normal bowel movement for a few days.  DIET: Your first meal following the procedure should be a light meal and then it is ok to progress to your normal diet.  A half-sandwich or bowl of soup is an example of a good first meal.  Heavy or fried foods are harder to digest and may make you feel nauseous or bloated.  Likewise meals heavy in dairy and vegetables can cause extra gas to form and this can also increase the bloating.  Drink plenty of fluids but you should avoid alcoholic beverages for 24 hours.  ACTIVITY: Your care partner should take you home directly after the procedure.  You should plan to take it easy, moving slowly for the rest of the day.  You can resume normal activity the day after the procedure however you should NOT DRIVE or use heavy machinery for 24 hours (because of the sedation medicines used during the test).    SYMPTOMS TO REPORT IMMEDIATELY: A gastroenterologist can be reached at any hour.  During normal business hours, 8:30 AM to 5:00 PM Monday through Friday,  call [phone].  After hours and on weekends, please call the GI answering service at [phone] who will take a message and have the physician on call contact you.    Following upper endoscopy (EGD)  Vomiting of blood or coffee ground material  New chest pain or pain under the shoulder blades  Painful or persistently difficult swallowing  New shortness of breath  Fever of 100F or higher  Black, tarry-looking stools  FOLLOW UP: If any biopsies were taken you will be contacted by phone or by letter within the next 1-3 weeks.  Call your gastroenterologist if you have not heard about the biopsies in 3 weeks.  Our staff will call the home number listed on your records the next business day following your procedure to check on you and address any questions or concerns that you may have at that time regarding the information given to you following your procedure. This is a courtesy call and so if there is no answer at the home number and we have not heard from you through the emergency physician on call, we will assume that you have returned to your regular daily activities without incident.  SIGNATURES/CONFIDENTIALITY: You and/or your care partner have signed paperwork which will be entered into your electronic medical record.  These signatures attest to the fact that that the information above on your After Visit Summary has been reviewed and is understood.  Full   responsibility of the confidentiality of this discharge information lies with you and/or your care-partner.   Resume medications.Information given on barrett's with discharge instructions. 

## 2012-04-19 NOTE — Progress Notes (Signed)
Patient did not experience any of the following events: a burn prior to discharge; a fall within the facility; wrong site/side/patient/procedure/implant event; or a hospital transfer or hospital admission upon discharge from the facility. (G8907) Patient did not have preoperative order for IV antibiotic SSI prophylaxis. (G8918)  

## 2012-04-19 NOTE — Op Note (Signed)
[facility] [address] [place], [address]   ENDOSCOPY PROCEDURE REPORT  PATIENT: [name], [name]  MR#: [id number] BIRTHDATE: [birth year] , 86  yrs. old GENDER: Male ENDOSCOPIST: Meryl Dare, MD, [facility]  PROCEDURE DATE:  04/19/2012 PROCEDURE:  EGD w/ biopsy ASA CLASS:     Class II INDICATIONS:  history of Barrett's esophagus. MEDICATIONS: MAC sedation, administered by CRNA and propofol (Diprivan) 60mg  IV TOPICAL ANESTHETIC: Cetacaine Spray DESCRIPTION OF PROCEDURE: After the risks benefits and alternatives of the procedure were thoroughly explained, informed consent was obtained.  The LB [id number] endoscope was introduced through the mouth and advanced to the second portion of the duodenum. Without limitations.  The instrument was slowly withdrawn as the mucosa was fully examined.   ESOPHAGUS: There was evidence of suspected Barrett's esophagus, irregular z-line at the gastroesophageal junction. Multiple biopsies were performed.  The esophagus was otherwise normal. STOMACH: The mucosa of the entire stomach appeared normal. DUODENUM: The duodenal mucosa showed no abnormalities in the bulb and second portion of the duodenum.  Retroflexed views revealed a 4-5 cm hiatal hernia.   The scope was then withdrawn from the patient and the procedure completed.  COMPLICATIONS: There were no complications. ENDOSCOPIC IMPRESSION: 1.   Suspected Barrett's esophagus; multiple biopsies 2.   Hiatal hernia   RECOMMENDATIONS: 1.  await pathology results 2.  anti-reflux regimen to be follow 3.  continue PPI 4. No future endoscopies planned for Barretts surveillance due to age    eSigned:  Meryl Dare, MD, [facility] 04/19/2012 8:47 AM   [place] Copy]

## 2012-04-19 NOTE — Progress Notes (Signed)
Propofol per m smith crna, all meds titrated per crna during procedure. See scanned intra procedure report. ewm

## 2012-04-20 ENCOUNTER — Telehealth: Payer: Self-pay | Admitting: *Deleted

## 2012-04-20 NOTE — Telephone Encounter (Signed)
  Follow up Call-  Call back number 04/19/2012  Post procedure Call Back phone  # [phone]  Permission to leave phone message Yes     Patient questions:  Do you have a fever, pain , or abdominal swelling? no Pain Score  0 *  Have you tolerated food without any problems? yes  Have you been able to return to your normal activities? yes  Do you have any questions about your discharge instructions: Diet   no Medications  no Follow up visit  no  Do you have questions or concerns about your Care? no  Actions: * If pain score is 4 or above: No action needed, pain <4.

## 2012-04-26 ENCOUNTER — Encounter: Payer: Self-pay | Admitting: Gastroenterology

## 2012-05-03 ENCOUNTER — Other Ambulatory Visit: Payer: Self-pay | Admitting: *Deleted

## 2012-05-03 MED ORDER — GEMFIBROZIL 600 MG PO TABS: 600.0000 mg | ORAL_TABLET | Freq: Two times a day (BID) | ORAL | Status: DC

## 2012-05-11 ENCOUNTER — Other Ambulatory Visit: Payer: Self-pay | Admitting: *Deleted

## 2012-05-11 MED ORDER — GEMFIBROZIL 600 MG PO TABS: 600.0000 mg | ORAL_TABLET | Freq: Two times a day (BID) | ORAL | Status: DC

## 2012-06-07 ENCOUNTER — Ambulatory Visit (INDEPENDENT_AMBULATORY_CARE_PROVIDER_SITE_OTHER): Payer: Medicare Other

## 2012-06-07 DIAGNOSIS — Z23 Encounter for immunization: Secondary | ICD-10-CM

## 2012-08-10 ENCOUNTER — Ambulatory Visit (INDEPENDENT_AMBULATORY_CARE_PROVIDER_SITE_OTHER): Payer: Medicare Other | Admitting: Internal Medicine

## 2012-08-10 ENCOUNTER — Encounter: Payer: Self-pay | Admitting: Internal Medicine

## 2012-08-10 VITALS — BP 122/80 | HR 72 | Temp 97.0°F | Ht 69.0 in | Wt 167.0 lb

## 2012-08-10 DIAGNOSIS — I10 Essential (primary) hypertension: Secondary | ICD-10-CM

## 2012-08-10 DIAGNOSIS — Z0000 Encounter for general adult medical examination without abnormal findings: Secondary | ICD-10-CM

## 2012-08-10 DIAGNOSIS — E785 Hyperlipidemia, unspecified: Secondary | ICD-10-CM

## 2012-08-10 NOTE — Progress Notes (Signed)
Subjective:    Patient ID: [name], male    DOB: Mar 23, 1926, 78 y.o.   MRN: [id number]  HPI The patient is here for annual Medicare wellness examination and management of other chronic and acute problems.  He has no new medical problems or complaints, he feels the best he has in some time.    The risk factors are reflected in the social history.  The roster of all physicians providing medical care to patient - is listed in the Snapshot section of the chart.  Activities of daily living:  The patient is 100% inedpendent in all ADLs: dressing, toileting, feeding as well as independent mobility  Home safety : The patient has smoke detectors in the home. Falls - no falls, Hme is fall safe. They wear seatbelts.  firearms are present in the home, kept in a safe fashion. There is no violence in the home.   There is no risks for hepatitis, STDs or HIV. There is no history of blood transfusion. They have no travel history to infectious disease endemic areas of the world.  The patient has not seen their dentist in the last six month - full dentures. They have seen their eye doctor in the last year - has intraocular injections for macular degeneration. They admit to hearing difficulty and have not had audiologic testing in the last year.    They do not  have excessive sun exposure. Discussed the need for sun protection: hats, long sleeves and use of sunscreen if there is significant sun exposure.   Diet: the importance of a healthy diet is discussed. They do have a healthy  diet.  The patient has a regular exercise program: walks , 20-30 min duration, 2-3 per week.  The benefits of regular aerobic exercise were discussed.  Depression screen: there are no signs or vegative symptoms of depression- irritability, change in appetite, anhedonia, sadness/tearfullness.  Cognitive assessment: the patient manages all their financial and personal affairs and is actively engaged.   Past Medical History   Diagnosis Date  . Barrett esophagus 09/2003  . GERD (gastroesophageal reflux disease)   . Hemorrhoids   . Diverticulosis   . Hyperlipidemia   . Hypertension   . Von Willebrand disease   . PUD (peptic ulcer disease)   . Hearing loss   . GI AVM (gastrointestinal arteriovenous vascular malformation)   . History of prostatitis   . Hemorrhoids   . Macular degeneration, wet     receiving intra-ocular injections ([facility])   Past Surgical History  Procedure Date  . Inguinal hernia repair 2001   Family History  Problem Relation Age of Onset  . Heart attack Father   . Coronary artery disease Father   . Hypertension Mother   . Pancreatic cancer Mother     Died at 47.  . Cancer Mother     Pancreatic cancer  . Arthritis Sister   . Hyperlipidemia Sister   . Prostate cancer Brother   . Cancer Brother     Bone Cancer secondary to Prostate Cancer  . Diabetes Neg Hx    History   Social History  . Marital Status: Married    Spouse Name: Carlito Bogert    Number of Children: N/A  . Years of Education: N/A   Occupational History  . Retired   .     Social History Main Topics  . Smoking status: Former Games developer  . Smokeless tobacco: Never Used  . Alcohol Use: No  . Drug Use: No  .  Sexually Active: Not Currently   Other Topics Concern  . Not on file   Social History Narrative   6th grade. Married 01-14-1951 1 son, 1 dtr - died at 5 months. Work - Management consultant, Engineer, drilling, etc.Retired-fulltime. "[employer]". ACP - yes DNR, yes short-term mechanical ventilarion, no heroic or futile measures in face of irreversible.    Current Outpatient Prescriptions on File Prior to Visit  Medication Sig Dispense Refill  . acetaminophen (TYLENOL) 325 MG tablet Take 325 mg by mouth as needed.        [name] Kitchen amLODipine (NORVASC) 10 MG tablet Take 1 tablet (10 mg total) by mouth daily.  30 tablet  5  . aspirin 81 MG tablet Take 81 mg by mouth daily.        . Bevacizumab (AVASTIN) 100 MG/4ML SOLN Inject  into the vein. Inject into right eye as needed       . cetirizine (ZYRTEC) 10 MG tablet Take 10 mg by mouth daily.        [name] Kitchen gabapentin (NEURONTIN) 300 MG capsule Take 1 capsule (300 mg total) by mouth 3 (three) times daily.  90 capsule  5  . gemfibrozil (LOPID) 600 MG tablet Take 1 tablet (600 mg total) by mouth 2 (two) times daily.  60 tablet  5  . Multiple Vitamins-Minerals (OCUVITE ADULT 50+ PO) Take 1 capsule by mouth daily.        . pantoprazole (PROTONIX) 40 MG tablet Take 1 tablet (40 mg total) by mouth 2 (two) times daily.  60 tablet  11     Vision, hearing, body mass index were assessed and reviewed.   During the course of the visit the patient was educated and counseled about appropriate screening and preventive services including : fall prevention , diabetes screening, nutrition counseling, colorectal cancer screening, and recommended immunizations.    Review of Systems Constitutional:  Negative for fever, chills, activity change and unexpected weight change.  HEENT:  Negative for hearing loss, ear pain, congestion, neck stiffness and postnasal drip. Negative for sore throat or swallowing problems. Negative for dental complaints.   Eyes: Negative for vision loss or change in visual acuity.  Respiratory: Negative for chest tightness and wheezing. Negative for DOE.   Cardiovascular: Negative for chest pain or palpitations. No decreased exercise tolerance Gastrointestinal: No change in bowel habit. No bloating or gas. No reflux or indigestion Genitourinary: Negative for urgency, frequency, flank pain and difficulty urinating.  Musculoskeletal: Negative for myalgias, back pain, arthralgias and gait problem.  Neurological: Negative for dizziness, tremors, weakness and headaches.  Hematological: Negative for adenopathy.  Psychiatric/Behavioral: Negative for behavioral problems and dysphoric mood.       Objective:   Physical Exam Filed Vitals:   08/10/12 1421  BP: 122/80   Pulse: 72  Temp: 97 F (36.1 C)   Wt Readings from Last 3 Encounters:  08/10/12 167 lb (75.751 kg)  04/19/12 165 lb (74.844 kg)  02/17/12 165 lb 12.8 oz (75.206 kg)   Gen'l: Well nourished well developed white male in no acute distress  HEENT: Head: Normocephalic and atraumatic. Right Ear: External ear normal with hearing aid. EAC/TM nl. Left Ear: External ear normal with hearing aid.  EAC/TM nl. Nose: Nose normal. Mouth/Throat: Oropharynx is clear and moist. Dentition - full dentures. No buccal or palatal lesions. Posterior pharynx clear. Eyes: Conjunctivae and sclera clear. EOM intact. Pupils are equal, round, and reactive to light. Right eye exhibits no discharge. Left eye exhibits no discharge. Neck: Normal  range of motion. Neck supple. No JVD present. No tracheal deviation present. No thyromegaly present.  Cardiovascular: Normal rate, regular rhythm, no gallop, no friction rub, no murmur heard.      Quiet precordium. 2+ radial and DP pulses . No carotid bruits Pulmonary/Chest: Effort normal. No respiratory distress or increased WOB, no wheezes, no rales. No chest wall deformity or CVAT. Abdomen: Soft. Bowel sounds are normal in all quadrants. He exhibits no distension, no tenderness, no rebound or guarding, No heptosplenomegaly  Genitourinary:  deferred Musculoskeletal: Normal range of motion. He exhibits no edema and no tenderness.       Small and large joints without redness, synovial thickening or deformity. Full range of motion preserved about all small, median and large joints.  Lymphadenopathy:    He has no cervical or supraclavicular adenopathy.  Neurological: He is alert and oriented to person, place, and time. CN II-XII intact. DTRs 2+ and symmetrical biceps, radial and patellar tendons. Cerebellar function normal with no tremor, rigidity, normal gait and station.  Skin: Skin is warm and dry. No rash noted. No erythema.  Psychiatric: He has a normal mood and affect. His  behavior is normal. Thought content normal.   Lab Results  Component Value Date   WBC 6.3 04/29/2011   HGB 14.9 04/29/2011   HCT 44.5 04/29/2011   PLT 251.0 04/29/2011   GLUCOSE 97 08/13/2012   CHOL 234* 08/13/2012   TRIG 115.0 08/13/2012   HDL 47.00 08/13/2012   LDLDIRECT 169.6 08/13/2012   LDLCALC 100* 06/26/2007   ALT 15 08/13/2012   AST 26 08/13/2012   NA 139 08/13/2012   K 4.0 08/13/2012   CL 104 08/13/2012   CREATININE 1.2 08/13/2012   BUN 15 08/13/2012   CO2 26 08/13/2012   TSH 2.96 08/11/2011   PSA 2.47 07/27/2009         Assessment & Plan:

## 2012-08-13 ENCOUNTER — Encounter: Payer: Medicare Other | Admitting: Internal Medicine

## 2012-08-13 ENCOUNTER — Other Ambulatory Visit (INDEPENDENT_AMBULATORY_CARE_PROVIDER_SITE_OTHER): Payer: Medicare Other

## 2012-08-13 DIAGNOSIS — I10 Essential (primary) hypertension: Secondary | ICD-10-CM

## 2012-08-13 DIAGNOSIS — E785 Hyperlipidemia, unspecified: Secondary | ICD-10-CM

## 2012-08-13 LAB — COMPREHENSIVE METABOLIC PANEL
ALT: 15 U/L (ref 0–53)
AST: 26 U/L (ref 0–37)
Albumin: 4.2 g/dL (ref 3.5–5.2)
Alkaline Phosphatase: 75 U/L (ref 39–117)
BUN: 15 mg/dL (ref 6–23)
CO2: 26 mEq/L (ref 19–32)
Calcium: 9.4 mg/dL (ref 8.4–10.5)
Chloride: 104 mEq/L (ref 96–112)
Creatinine, Ser: 1.2 mg/dL (ref 0.4–1.5)
GFR: 63.63 mL/min (ref >60.00)
Glucose, Bld: 97 mg/dL (ref 70–99)
Potassium: 4 mEq/L (ref 3.5–5.1)
Sodium: 139 mEq/L (ref 135–145)
Total Bilirubin: 0.6 mg/dL (ref 0.3–1.2)
Total Protein: 7.5 g/dL (ref 6.0–8.3)

## 2012-08-13 LAB — LIPID PANEL
Cholesterol: 234 mg/dL — ABNORMAL HIGH (ref 0–200)
HDL: 47 mg/dL (ref >39.00)
Total CHOL/HDL Ratio: 5
Triglycerides: 115 mg/dL (ref 0.0–149.0)
VLDL: 23 mg/dL (ref 0.0–40.0)

## 2012-08-13 LAB — LDL CHOLESTEROL, DIRECT: Direct LDL: 169.6 mg/dL

## 2012-08-16 NOTE — Assessment & Plan Note (Signed)
BP Readings from Last 3 Encounters:  08/10/12 122/80  04/19/12 151/71  02/17/12 130/58   Generally good control on single agent.   Plan- continue present medication

## 2012-08-16 NOTE — Assessment & Plan Note (Signed)
Interval history - unremarkable. Physical exam is normal except for hearing loss. Lab results are in normal range except for LDL cholesterol. He is current with colorectal cancer screening - no further screening tests required and he has aged out for prostate cancer screening. Immunizations - due for shingles vaccine.  In summary - a delightful fellow who is medically stable except for a need to better control cholesterol levels.

## 2012-08-16 NOTE — Assessment & Plan Note (Signed)
LDL above goal of 100 or less.  Plan- will need ov to discuss medication change.

## 2012-08-29 ENCOUNTER — Telehealth: Payer: Self-pay | Admitting: Internal Medicine

## 2012-08-29 NOTE — Telephone Encounter (Signed)
Please call pt and schedule appt

## 2012-08-29 NOTE — Telephone Encounter (Signed)
Patient Information:  Caller Name: Onis Markoff  Phone: [phone]  Patient: [name]  Gender: Male  DOB: 06-25-1926  Age: 79 Years  PCP: Illene Regulus (Adults only)  Office Follow Up:  Does the office need to follow up with this patient?: Yes  Instructions For The Office: OFFICE NO APPT FOUND IN EPIC.  PLEASE FOLLOW UP IF PT CAN BE SEEN IN THE OFFICE  RN Note:  NO APPT FOUND IN EPIC.  OFFICE PLEASE FOLLOW UP WITH PATIENT IF PT CAN BE SEEN IN THE OFFICE  Symptoms  Reason For Call & Symptoms: Dizzy and nauseated that began at 1300.  Spouse tried to call ENT and they said pt needed to see PCP first  Reviewed Health History In EMR: Yes  Reviewed Medications In EMR: Yes  Reviewed Allergies In EMR: Yes  Reviewed Surgeries / Procedures: Yes  Date of Onset of Symptoms: 08/29/2012  Guideline(s) Used:  Dizziness  Disposition Per Guideline:   Go to Office Now  Reason For Disposition Reached:   Lightheadedness (dizziness) present now, after 2 hours of rest and fluids  Advice Given:  Drink Fluids:  Drink several glasses of fruit juice, other clear fluids, or water. This will improve hydration and blood glucose. If you have a fever or have had heat exposure, make sure the fluids are cold.  Rest for 1-2 Hours:  Lie down with feet elevated for 1 hour. This will improve blood flow and increase blood flow to the brain.  Call Back If:  Passes out (faints)  You become worse.

## 2012-08-30 ENCOUNTER — Ambulatory Visit (INDEPENDENT_AMBULATORY_CARE_PROVIDER_SITE_OTHER): Payer: Medicare Other | Admitting: Internal Medicine

## 2012-08-30 ENCOUNTER — Encounter: Payer: Self-pay | Admitting: Internal Medicine

## 2012-08-30 VITALS — BP 134/74 | HR 81 | Temp 97.9°F | Ht 69.0 in | Wt 169.0 lb

## 2012-08-30 DIAGNOSIS — H8309 Labyrinthitis, unspecified ear: Secondary | ICD-10-CM

## 2012-08-30 MED ORDER — MECLIZINE HCL 12.5 MG PO TABS: 12.5000 mg | ORAL_TABLET | Freq: Three times a day (TID) | ORAL | Status: DC | PRN

## 2012-08-30 NOTE — Patient Instructions (Addendum)
Labyrinthitis (Inner Ear Inflammation) Your exam shows you have an inner ear disturbance or labyrinthitis. The cause of this condition is not known. But it may be due to a virus infection. The symptoms of labyrinthitis include vertigo or dizziness made worse by motion, nausea and vomiting. The onset of labyrinthitis may be very sudden. It usually lasts for a few days and then clears up over 1-2 weeks. The treatment of an inner ear disturbance includes bed rest and medications to reduce dizziness, nausea, and vomiting. You should stay away from alcohol, tranquilizers, caffeine, nicotine, or any medicine your doctor thinks may make your symptoms worse. Further testing may be needed to evaluate your hearing and balance system. Please see your doctor or go to the emergency room right away if you have:  Increasing vertigo, earache, loss of hearing, or ear drainage.  Headache, blurred vision, trouble walking, fainting, or fever.  Persistent vomiting, dehydration, or extreme weakness. Document Released: 08/08/2005 Document Revised: 10/31/2011 Document Reviewed: 01/24/2007 Exit[employer] Patient Information 2013 [employer].  

## 2012-08-30 NOTE — Telephone Encounter (Signed)
Pt scheduled 08/30/2012

## 2012-08-30 NOTE — Progress Notes (Signed)
Subjective:    Patient ID: [name], male    DOB: Jan 06, 1926, 79 y.o.   MRN: [id number]  HPI  Pt presents to the clinic today with c/o dizziness and nausea. This started 2 days ago. It is worse when he is up ad moving and better when he lays down. He does have problems with allergies and takes zyrtec on occasion. He also has trouble with wax buildup. He is not sure if that is what is causing his dizziness. He has not fallen or had vomiting associated with the nausea. He has had similar symptoms like this in the past.  Review of Systems      Past Medical History  Diagnosis Date  . Barrett esophagus 09/2003  . GERD (gastroesophageal reflux disease)   . Hemorrhoids   . Diverticulosis   . Hyperlipidemia   . Hypertension   . Von Willebrand disease   . PUD (peptic ulcer disease)   . Hearing loss   . GI AVM (gastrointestinal arteriovenous vascular malformation)   . History of prostatitis   . Hemorrhoids   . Macular degeneration, wet     receiving intra-ocular injections ([facility])    Current Outpatient Prescriptions  Medication Sig Dispense Refill  . acetaminophen (TYLENOL) 325 MG tablet Take 325 mg by mouth as needed.        [name] Kitchen amLODipine (NORVASC) 10 MG tablet Take 1 tablet (10 mg total) by mouth daily.  30 tablet  5  . aspirin 81 MG tablet Take 81 mg by mouth daily.        . Bevacizumab (AVASTIN) 100 MG/4ML SOLN Inject into the vein. Inject into right eye as needed       . cetirizine (ZYRTEC) 10 MG tablet Take 10 mg by mouth daily.        [name] Kitchen gabapentin (NEURONTIN) 300 MG capsule Take 1 capsule (300 mg total) by mouth 3 (three) times daily.  90 capsule  5  . gemfibrozil (LOPID) 600 MG tablet Take 1 tablet (600 mg total) by mouth 2 (two) times daily.  60 tablet  5  . Multiple Vitamins-Minerals (OCUVITE ADULT 50+ PO) Take 1 capsule by mouth daily.        . pantoprazole (PROTONIX) 40 MG tablet Take 1 tablet (40 mg total) by mouth 2 (two) times daily.  60 tablet  11    Allergies    Allergen Reactions  . Atorvastatin     REACTION: Side effects    Family History  Problem Relation Age of Onset  . Heart attack Father   . Coronary artery disease Father   . Hypertension Mother   . Pancreatic cancer Mother     Died at 100.  . Cancer Mother     Pancreatic cancer  . Arthritis Sister   . Hyperlipidemia Sister   . Prostate cancer Brother   . Cancer Brother     Bone Cancer secondary to Prostate Cancer  . Diabetes Neg Hx     History   Social History  . Marital Status: Married    Spouse Name: [name]    Number of Children: N/A  . Years of Education: N/A   Occupational History  . Retired   .     Social History Main Topics  . Smoking status: Former Games developer  . Smokeless tobacco: Never Used  . Alcohol Use: No  . Drug Use: No  . Sexually Active: Not Currently   Other Topics Concern  . Not on file   Social  History Narrative   6th grade. Married 01/17/51 1 son, 1 dtr - died at 5 months. Work - Management consultant, Engineer, drilling, etc.Retired-fulltime. "[employer]". ACP - yes DNR, yes short-term mechanical ventilarion, no heroic or futile measures in face of irreversible.     Constitutional: Denies fever, malaise, fatigue, headache or abrupt weight changes.  HEENT: Denies eye pain, eye redness, ear pain, ringing in the ears, wax buildup, runny nose, nasal congestion, bloody nose, or sore throat. Gastrointestinal: Pt reports nausea. Denies abdominal pain, bloating, constipation, diarrhea or blood in the stool.   Neurological: Pt reports dizziness. Denies difficulty with memory, difficulty with speech or problems with balance and coordination.   No other specific complaints in a complete review of systems (except as listed in HPI above).  Objective:   Physical Exam   BP 134/74  Pulse 81  Temp 97.9 F (36.6 C) (Oral)  Ht 5\' 9"  (1.753 m)  Wt 169 lb (76.658 kg)  BMI 24.96 kg/m2  SpO2 96% Wt Readings from Last 3 Encounters:  08/30/12 169 lb (76.658 kg)   08/10/12 167 lb (75.751 kg)  04/19/12 165 lb (74.844 kg)    General: Appears his stated age, well developed, well nourished in NAD. HEENT: Head: normal shape and size; Eyes: sclera white, no icterus, conjunctiva pink, PERRLA and EOMs intact; Ears: Tm's gray and intact, normal light reflex; Nose: mucosa pink and moist, septum midline; Throat/Mouth: Teeth present, mucosa pink and moist, no exudate, lesions or ulcerations noted.  Cardiovascular: Normal rate and rhythm. S1,S2 noted.  No murmur, rubs or gallops noted. No JVD or BLE edema. No carotid bruits noted. Pulmonary/Chest: Normal effort and positive vesicular breath sounds. No respiratory distress. No wheezes, rales or ronchi noted.  Neurological: Alert and oriented. Cranial nerves II-XII intact. Coordination normal. +DTRs bilaterally.       Assessment & Plan:   Labyrinthitis  eRx for meclazine 12.5 mg  RTC as needed or if symptoms persist

## 2012-09-03 ENCOUNTER — Other Ambulatory Visit: Payer: Self-pay | Admitting: *Deleted

## 2012-09-03 MED ORDER — GABAPENTIN 300 MG PO CAPS: 300.0000 mg | ORAL_CAPSULE | Freq: Three times a day (TID) | ORAL | Status: DC

## 2012-09-03 MED ORDER — AMLODIPINE BESYLATE 10 MG PO TABS: 10.0000 mg | ORAL_TABLET | Freq: Every day | ORAL | Status: DC

## 2012-12-14 ENCOUNTER — Ambulatory Visit (INDEPENDENT_AMBULATORY_CARE_PROVIDER_SITE_OTHER): Payer: Medicare Other | Admitting: Internal Medicine

## 2012-12-14 ENCOUNTER — Encounter: Payer: Self-pay | Admitting: Internal Medicine

## 2012-12-14 VITALS — BP 152/70 | HR 88 | Temp 97.9°F | Wt 171.8 lb

## 2012-12-14 DIAGNOSIS — J309 Allergic rhinitis, unspecified: Secondary | ICD-10-CM

## 2012-12-14 MED ORDER — METHYLPREDNISOLONE ACETATE 80 MG/ML IJ SUSP
80.0000 mg | Freq: Once | INTRAMUSCULAR | Status: AC
  Administered 2012-12-14: 80 mg via INTRAMUSCULAR

## 2012-12-14 MED ORDER — FLUTICASONE PROPIONATE 50 MCG/ACT NA SUSP: 2.0000 | Freq: Every day | NASAL | Status: DC

## 2012-12-14 NOTE — Progress Notes (Signed)
HPI  Pt presents to the clinic today with c/o cold symptoms x 1 week. He is having watery eyes, runny nose, sore throat, cough and some mild dizziness. He has been taking Zyrtec OTC for the past week. He does not think it is helping. He denies fever, chills or body aches. He has no history of allergies or asthma. He has not had sick contacts.  Review of Systems      Past Medical History  Diagnosis Date  . Barrett esophagus 09/2003  . GERD (gastroesophageal reflux disease)   . Hemorrhoids   . Diverticulosis   . Hyperlipidemia   . Hypertension   . Von Willebrand disease   . PUD (peptic ulcer disease)   . Hearing loss   . GI AVM (gastrointestinal arteriovenous vascular malformation)   . History of prostatitis   . Hemorrhoids   . Macular degeneration, wet     receiving intra-ocular injections ([facility])    Family History  Problem Relation Age of Onset  . Heart attack Father   . Coronary artery disease Father   . Hypertension Mother   . Pancreatic cancer Mother     Died at 31.  . Cancer Mother     Pancreatic cancer  . Arthritis Sister   . Hyperlipidemia Sister   . Prostate cancer Brother   . Cancer Brother     Bone Cancer secondary to Prostate Cancer  . Diabetes Neg Hx     History   Social History  . Marital Status: Married    Spouse Name: Arnie Clingenpeel    Number of Children: N/A  . Years of Education: N/A   Occupational History  . Retired   .     Social History Main Topics  . Smoking status: Former Games developer  . Smokeless tobacco: Never Used  . Alcohol Use: No  . Drug Use: No  . Sexually Active: Not Currently   Other Topics Concern  . Not on file   Social History Narrative   6th grade. Married Jan 16, 1951 1 son, 1 dtr - died at 5 months. Work - Management consultant, Engineer, drilling, Catering manager.   Retired-fulltime. "[employer]". ACP - yes DNR, yes short-term mechanical ventilarion, no heroic or futile measures in face of irreversible.                Allergies  Allergen  Reactions  . Atorvastatin     REACTION: Side effects     Constitutional: Positive headache, fatigue. Denies fever or abrupt weight changes.  HEENT:  Positive watery eyes, runny nose, sore throat. Denies eye redness, eye pain, pressure behind the eyes, facial pain, nasal congestion, ear pain, ringing in the ears, wax buildup, runny nose or bloody nose. Respiratory: Positive cough. Denies difficulty breathing or shortness of breath.  Cardiovascular: Denies chest pain, chest tightness, palpitations or swelling in the hands or feet.   No other specific complaints in a complete review of systems (except as listed in HPI above).  Objective:   BP 152/70  Pulse 88  Temp(Src) 97.9 F (36.6 C) (Oral)  Wt 171 lb 12.8 oz (77.928 kg)  BMI 25.36 kg/m2  SpO2 97% Wt Readings from Last 3 Encounters:  12/14/12 171 lb 12.8 oz (77.928 kg)  08/30/12 169 lb (76.658 kg)  08/10/12 167 lb (75.751 kg)     General: Appears his stated age, well developed, well nourished in NAD. HEENT: Head: normal shape and size; Eyes: sclera white, no icterus, conjunctiva pink, PERRLA and EOMs intact; Ears: Tm's gray and  intact, + effusion bilaterally, normal light reflex; Nose: mucosa boggy and moist, septum midline; Throat/Mouth: + PND. Teeth present, mucosa erythematous and moist, no exudate noted, no lesions or ulcerations noted.  Neck: Mild tonsillar lymphadenopathy. Neck supple, trachea midline. No massses, lumps or thyromegaly present.  Cardiovascular: Normal rate and rhythm. S1,S2 noted.  No murmur, rubs or gallops noted. No JVD or BLE edema. No carotid bruits noted. Pulmonary/Chest: Normal effort and positive vesicular breath sounds. No respiratory distress. No wheezes, rales or ronchi noted.      Assessment & Plan:   Allergic Rhinitis, new onset:  Do salt water gargles for the sore throat eRx for Flonase daily Continue your Zyrtec daily Avoid your triggers  80 mg Depo IM  RTC as needed or if symptoms  persist.

## 2012-12-14 NOTE — Addendum Note (Signed)
Addended by: Lyanne Co R on: 12/14/2012 10:07 AM   Modules accepted: Orders

## 2012-12-14 NOTE — Patient Instructions (Signed)
Allergic Rhinitis  Allergic rhinitis is when the mucous membranes in the nose respond to allergens. Allergens are particles in the air that cause your body to have an allergic reaction. This causes you to release allergic antibodies. Through a chain of events, these eventually cause you to release histamine into the blood stream (hence the use of antihistamines). Although meant to be protective to the body, it is this release that causes your discomfort, such as frequent sneezing, congestion and an itchy runny nose.    CAUSES    The pollen allergens may come from grasses, trees, and weeds. This is seasonal allergic rhinitis, or "hay fever." Other allergens cause year-round allergic rhinitis (perennial allergic rhinitis) such as house dust mite allergen, pet dander and mold spores.    SYMPTOMS     Nasal stuffiness (congestion).   Runny, itchy nose with sneezing and tearing of the eyes.   There is often an itching of the mouth, eyes and ears.  It cannot be cured, but it can be controlled with medications.  DIAGNOSIS    If you are unable to determine the offending allergen, skin or blood testing may find it.  TREATMENT     Avoid the allergen.   Medications and allergy shots (immunotherapy) can help.   Hay fever may often be treated with antihistamines in pill or nasal spray forms. Antihistamines block the effects of histamine. There are over-the-counter medicines that may help with nasal congestion and swelling around the eyes. Check with your caregiver before taking or giving this medicine.  If the treatment above does not work, there are many new medications your caregiver can prescribe. Stronger medications may be used if initial measures are ineffective. Desensitizing injections can be used if medications and avoidance fails. Desensitization is when a patient is given ongoing shots until the body becomes less sensitive to the allergen. Make sure you follow up with your caregiver if problems continue.   SEEK MEDICAL CARE IF:     You develop fever (more than 100.5 F (38.1 C).   You develop a cough that does not stop easily (persistent).   You have shortness of breath.   You start wheezing.   Symptoms interfere with normal daily activities.  Document Released: 05/03/2001 Document Revised: 10/31/2011 Document Reviewed: 11/12/2008  [employer] Patient Information 2013 [employer].

## 2012-12-20 ENCOUNTER — Ambulatory Visit (INDEPENDENT_AMBULATORY_CARE_PROVIDER_SITE_OTHER): Payer: Medicare Other | Admitting: Internal Medicine

## 2012-12-20 ENCOUNTER — Telehealth: Payer: Self-pay | Admitting: Internal Medicine

## 2012-12-20 ENCOUNTER — Encounter: Payer: Self-pay | Admitting: Internal Medicine

## 2012-12-20 VITALS — BP 150/82 | HR 82 | Temp 97.3°F | Ht 69.0 in | Wt 169.0 lb

## 2012-12-20 DIAGNOSIS — I10 Essential (primary) hypertension: Secondary | ICD-10-CM

## 2012-12-20 MED ORDER — LISINOPRIL 10 MG PO TABS: 10.0000 mg | ORAL_TABLET | Freq: Every day | ORAL | Status: DC

## 2012-12-20 NOTE — Telephone Encounter (Signed)
Patient Information:  Caller Name: Kara Mead  Phone: [phone]  Patient: [name], [name]  Gender: Male  DOB: 15-Oct-1925  Age: 79 Years  PCP: Illene Regulus (Adults only)  Office Follow Up:  Does the office need to follow up with this patient?: No  Instructions For The Office: N/A  RN Note:  caller already has an appt scheduled for 1100 (Scheduled by office)  Office wanted caller to speak with RN to triage for emergent symptoms.  All emergent symptoms ruled out.  Symptoms  Reason For Call & Symptoms: wife states pt had elevated BP this am (196/91).  Wife took his BP again 1 hour 173/76 .  Pt denies headache but states "it doesnt feel right"  Reviewed Health History In EMR: Yes  Reviewed Medications In EMR: Yes  Reviewed Allergies In EMR: Yes  Reviewed Surgeries / Procedures: Yes  Date of Onset of Symptoms: 12/20/2012  Guideline(s) Used:  High Blood Pressure  Disposition Per Guideline:   See Today in Office  Reason For Disposition Reached:   BP > 180/110  Advice Given:  Call Back If:  Headache, blurred vision, difficulty talking, or difficulty walking occurs  Chest pain or difficulty breathing occurs  You want to go in to the office for a blood pressure check  You become worse.  Patient Will Follow Care Advice:  YES

## 2012-12-20 NOTE — Patient Instructions (Signed)

## 2012-12-20 NOTE — Assessment & Plan Note (Signed)
Uncontrolled on current therapy Will add Lisinopril 10 mg daily Continue to monitor blood pressure at home Call me if elevated or too low RTC in 1 month to recheck

## 2012-12-20 NOTE — Progress Notes (Signed)
Subjective:    Patient ID: [name], male    DOB: 05/26/1926, 79 y.o.   MRN: [id number]  HPI  Pt presents to the clinic today with c/o elevated blood pressure. He has noticed over the past few weeks that his blood pressure has been elevate usually above 150/90. This morning his blood pressure was 190/100. He did not have a headache or dizziness but reports that he did not just feel right. He denies chest pain, chest tightness of shortness of breath. He has been compliant with his blood pressure medication.  Review of Systems      Past Medical History  Diagnosis Date  . Barrett esophagus 09/2003  . GERD (gastroesophageal reflux disease)   . Hemorrhoids   . Diverticulosis   . Hyperlipidemia   . Hypertension   . Von Willebrand disease   . PUD (peptic ulcer disease)   . Hearing loss   . GI AVM (gastrointestinal arteriovenous vascular malformation)   . History of prostatitis   . Hemorrhoids   . Macular degeneration, wet     receiving intra-ocular injections ([facility])    Current Outpatient Prescriptions  Medication Sig Dispense Refill  . acetaminophen (TYLENOL) 325 MG tablet Take 325 mg by mouth as needed.        [name] Kitchen amLODipine (NORVASC) 10 MG tablet Take 1 tablet (10 mg total) by mouth daily.  30 tablet  5  . aspirin 81 MG tablet Take 81 mg by mouth daily.        . Bevacizumab (AVASTIN) 100 MG/4ML SOLN Inject into the vein. Inject into right eye as needed       . cetirizine (ZYRTEC) 10 MG tablet Take 10 mg by mouth daily.        . fluticasone (FLONASE) 50 MCG/ACT nasal spray Place 2 sprays into the nose daily.  16 g  6  . gabapentin (NEURONTIN) 300 MG capsule Take 1 capsule (300 mg total) by mouth 3 (three) times daily.  90 capsule  5  . gemfibrozil (LOPID) 600 MG tablet Take 1 tablet (600 mg total) by mouth 2 (two) times daily.  60 tablet  5  . Multiple Vitamins-Minerals (OCUVITE ADULT 50+ PO) Take 1 capsule by mouth daily.        . pantoprazole (PROTONIX) 40 MG tablet Take 1  tablet (40 mg total) by mouth 2 (two) times daily.  60 tablet  11  . lisinopril (PRINIVIL,ZESTRIL) 10 MG tablet Take 1 tablet (10 mg total) by mouth daily.  30 tablet  0   No current facility-administered medications for this visit.    Allergies  Allergen Reactions  . Atorvastatin     REACTION: Side effects    Family History  Problem Relation Age of Onset  . Heart attack Father   . Coronary artery disease Father   . Hypertension Mother   . Pancreatic cancer Mother     Died at 20.  . Cancer Mother     Pancreatic cancer  . Arthritis Sister   . Hyperlipidemia Sister   . Prostate cancer Brother   . Cancer Brother     Bone Cancer secondary to Prostate Cancer  . Diabetes Neg Hx     History   Social History  . Marital Status: Married    Spouse Name: Tamir Wallman    Number of Children: N/A  . Years of Education: N/A   Occupational History  . Retired   .     Social History Main Topics  .  Smoking status: Former Games developer  . Smokeless tobacco: Never Used  . Alcohol Use: No  . Drug Use: No  . Sexually Active: Not Currently   Other Topics Concern  . Not on file   Social History Narrative   6th grade. Married 01/12/51 1 son, 1 dtr - died at 5 months. Work - Management consultant, Engineer, drilling, Catering manager.   Retired-fulltime. "[employer] ACP - yes DNR, yes short-term mechanical ventilarion, no heroic or futile measures in face of irreversible.                 Constitutional: Denies fever, malaise, fatigue, headache or abrupt weight changes. [name] Kitchen Respiratory: Denies difficulty breathing, shortness of breath, cough or sputum production.   Cardiovascular: Denies chest pain, chest tightness, palpitations or swelling in the hands or feet.  Neurological: Denies dizziness, difficulty with memory, difficulty with speech or problems with balance and coordination.   No other specific complaints in a complete review of systems (except as listed in HPI above).  Objective:   Physical Exam  BP  150/82  Pulse 82  Temp(Src) 97.3 F (36.3 C) (Oral)  Ht 5\' 9"  (1.753 m)  Wt 169 lb (76.658 kg)  BMI 24.95 kg/m2  SpO2 95% Wt Readings from Last 3 Encounters:  12/20/12 169 lb (76.658 kg)  12/14/12 171 lb 12.8 oz (77.928 kg)  08/30/12 169 lb (76.658 kg)    General: Appears his stated age, well developed, well nourished in NAD.  Cardiovascular: Normal rate and rhythm. S1,S2 noted.  No murmur, rubs or gallops noted. No JVD or BLE edema. No carotid bruits noted. Pulmonary/Chest: Normal effort and positive vesicular breath sounds. No respiratory distress. No wheezes, rales or ronchi noted.   Neurological: Alert and oriented. Cranial nerves II-XII intact. Coordination normal. +DTRs bilaterally.   BMET    Component Value Date/Time   NA 139 08/13/2012 0932   K 4.0 08/13/2012 0932   CL 104 08/13/2012 0932   CO2 26 08/13/2012 0932   GLUCOSE 97 08/13/2012 0932   BUN 15 08/13/2012 0932   CREATININE 1.2 08/13/2012 0932   CALCIUM 9.4 08/13/2012 0932   GFRNONAA >60 04/26/2011 1012   GFRAA >60 04/26/2011 1012    Lipid Panel     Component Value Date/Time   CHOL 234* 08/13/2012 0932   TRIG 115.0 08/13/2012 0932   HDL 47.00 08/13/2012 0932   CHOLHDL 5 08/13/2012 0932   VLDL 23.0 08/13/2012 0932   LDLCALC 100* 06/26/2007 0855    CBC    Component Value Date/Time   WBC 6.3 04/29/2011 1619   WBC 7.6 05/23/2006 1300   RBC 5.25 04/29/2011 1619   RBC 5.78* 05/23/2006 1300   HGB 14.9 04/29/2011 1619   HGB 16.9 05/23/2006 1300   HCT 44.5 04/29/2011 1619   HCT 49.9 05/23/2006 1300   PLT 251.0 04/29/2011 1619   PLT 256 05/23/2006 1300   MCV 84.8 04/29/2011 1619   MCV 86.3 05/23/2006 1300   MCH 28.6 04/26/2011 1012   MCH 29.3 05/23/2006 1300   MCHC 33.6 04/29/2011 1619   MCHC 33.9 05/23/2006 1300   RDW 13.7 04/29/2011 1619   RDW 13.7 05/23/2006 1300   LYMPHSABS 1.3 04/29/2011 1619   LYMPHSABS 1.1 05/23/2006 1300   MONOABS 1.1* 04/29/2011 1619   MONOABS 0.6 05/23/2006 1300   EOSABS 0.1 04/29/2011 1619   EOSABS 0.1  05/23/2006 1300   BASOSABS 0.0 04/29/2011 1619   BASOSABS 0.0 05/23/2006 1300    Hgb A1C No results found for this  basename: HGBA1C         Assessment & Plan:

## 2012-12-22 ENCOUNTER — Encounter (HOSPITAL_COMMUNITY): Payer: Self-pay | Admitting: Emergency Medicine

## 2012-12-22 ENCOUNTER — Emergency Department (HOSPITAL_COMMUNITY)
Admission: EM | Admit: 2012-12-22 | Discharge: 2012-12-22 | Disposition: A | Discharge status: Home / Self Care | Payer: Medicare Other | Attending: Emergency Medicine | Admitting: Emergency Medicine

## 2012-12-22 DIAGNOSIS — Z7982 Long term (current) use of aspirin: Secondary | ICD-10-CM | POA: Insufficient documentation

## 2012-12-22 DIAGNOSIS — T783XXA Angioneurotic edema, initial encounter: Secondary | ICD-10-CM | POA: Insufficient documentation

## 2012-12-22 DIAGNOSIS — T464X5A Adverse effect of angiotensin-converting-enzyme inhibitors, initial encounter: Secondary | ICD-10-CM

## 2012-12-22 DIAGNOSIS — Z8679 Personal history of other diseases of the circulatory system: Secondary | ICD-10-CM | POA: Insufficient documentation

## 2012-12-22 DIAGNOSIS — Z8719 Personal history of other diseases of the digestive system: Secondary | ICD-10-CM | POA: Insufficient documentation

## 2012-12-22 DIAGNOSIS — Z862 Personal history of diseases of the blood and blood-forming organs and certain disorders involving the immune mechanism: Secondary | ICD-10-CM | POA: Insufficient documentation

## 2012-12-22 DIAGNOSIS — E785 Hyperlipidemia, unspecified: Secondary | ICD-10-CM | POA: Insufficient documentation

## 2012-12-22 DIAGNOSIS — I10 Essential (primary) hypertension: Secondary | ICD-10-CM | POA: Insufficient documentation

## 2012-12-22 DIAGNOSIS — Z79899 Other long term (current) drug therapy: Secondary | ICD-10-CM | POA: Insufficient documentation

## 2012-12-22 DIAGNOSIS — Z87448 Personal history of other diseases of urinary system: Secondary | ICD-10-CM | POA: Insufficient documentation

## 2012-12-22 DIAGNOSIS — T465X5A Adverse effect of other antihypertensive drugs, initial encounter: Secondary | ICD-10-CM | POA: Insufficient documentation

## 2012-12-22 DIAGNOSIS — Z87891 Personal history of nicotine dependence: Secondary | ICD-10-CM | POA: Insufficient documentation

## 2012-12-22 DIAGNOSIS — Z8711 Personal history of peptic ulcer disease: Secondary | ICD-10-CM | POA: Insufficient documentation

## 2012-12-22 DIAGNOSIS — K219 Gastro-esophageal reflux disease without esophagitis: Secondary | ICD-10-CM | POA: Insufficient documentation

## 2012-12-22 DIAGNOSIS — Z8669 Personal history of other diseases of the nervous system and sense organs: Secondary | ICD-10-CM | POA: Insufficient documentation

## 2012-12-22 LAB — POCT I-STAT, CHEM 8
BUN: 21 mg/dL (ref 6–23)
Calcium, Ion: 1.17 mmol/L (ref 1.13–1.30)
Chloride: 106 mEq/L (ref 96–112)
Creatinine, Ser: 1.1 mg/dL (ref 0.50–1.35)
Glucose, Bld: 98 mg/dL (ref 70–99)
HCT: 46 % (ref 39.0–52.0)
Hemoglobin: 15.6 g/dL (ref 13.0–17.0)
Potassium: 3.9 mEq/L (ref 3.5–5.1)
Sodium: 141 mEq/L (ref 135–145)
TCO2: 27 mmol/L (ref 0–100)

## 2012-12-22 MED ORDER — FAMOTIDINE IN NACL 20-0.9 MG/50ML-% IV SOLN
20.0000 mg | Freq: Once | INTRAVENOUS | Status: AC
  Administered 2012-12-22: 20 mg via INTRAVENOUS
  Filled 2012-12-22: qty 50

## 2012-12-22 MED ORDER — METHYLPREDNISOLONE SODIUM SUCC 125 MG IJ SOLR
125.0000 mg | Freq: Once | INTRAMUSCULAR | Status: AC
  Administered 2012-12-22: 125 mg via INTRAVENOUS
  Filled 2012-12-22: qty 2

## 2012-12-22 MED ORDER — DIPHENHYDRAMINE HCL 50 MG/ML IJ SOLN
12.5000 mg | Freq: Once | INTRAMUSCULAR | Status: AC
  Administered 2012-12-22: 12.5 mg via INTRAVENOUS
  Filled 2012-12-22: qty 1

## 2012-12-22 MED ORDER — HYDROCHLOROTHIAZIDE 25 MG PO TABS: 25.0000 mg | ORAL_TABLET | Freq: Every day | ORAL | Status: DC

## 2012-12-22 NOTE — ED Notes (Addendum)
Family reports pt woke up with right sided facial swelling this morning. Pt thought he had a stroke. EMS was called out to house and told family that pt was not having a stroke. Pt was working under the house yesterday could possibly got bit by something. Pt just started Lisinopril on 12/20/2012.

## 2012-12-22 NOTE — ED Provider Notes (Signed)
History    CSN: [id number] Arrival date & time 12/22/12  1191 First MD Initiated Contact with Patient 12/22/12 0800      Chief Complaint  Patient presents with  . Facial Swelling    HPI The patient presents to the emergency room with complaints of right sided lip swelling. Patient states he woke up this morning and noticed that his face on the right side was swollen. He was worried this could be a stroke so he called EMS. They assessed him and told him that was not a stroke. He decided to come the emergency room for further evaluation. Patient denies any known injuries. He was working underneath a house yesterday and is not sure if he possibly was bitten by something. The patient admits that he recently did start taking lisinopril for his blood pressure. Patient denies any difficulty swallowing or speaking. He denies tongue swelling or discomfort in the back of his throat..     Past Medical History  Diagnosis Date  . Barrett esophagus 09/2003  . GERD (gastroesophageal reflux disease)   . Hemorrhoids   . Diverticulosis   . Hyperlipidemia   . Hypertension   . Von Willebrand disease   . PUD (peptic ulcer disease)   . Hearing loss   . GI AVM (gastrointestinal arteriovenous vascular malformation)   . History of prostatitis   . Hemorrhoids   . Macular degeneration, wet     receiving intra-ocular injections ([facility])    Past Surgical History  Procedure Laterality Date  . Inguinal hernia repair  2001    Family History  Problem Relation Age of Onset  . Heart attack Father   . Coronary artery disease Father   . Hypertension Mother   . Pancreatic cancer Mother     Died at 60.  . Cancer Mother     Pancreatic cancer  . Arthritis Sister   . Hyperlipidemia Sister   . Prostate cancer Brother   . Cancer Brother     Bone Cancer secondary to Prostate Cancer  . Diabetes Neg Hx     History  Substance Use Topics  . Smoking status: Former Games developer  . Smokeless tobacco: Never Used   Comment: Quit 30 years ago as of 2014  . Alcohol Use: No      Review of Systems  All other systems reviewed and are negative.    Allergies  Atorvastatin  Home Medications   Current Outpatient Rx  Name  Route  Sig  Dispense  Refill  . acetaminophen (TYLENOL) 325 MG tablet   Oral   Take 325 mg by mouth as needed.           [name] Kitchen amLODipine (NORVASC) 10 MG tablet   Oral   Take 1 tablet (10 mg total) by mouth daily.   30 tablet   5   . aspirin 81 MG tablet   Oral   Take 81 mg by mouth daily.           [name] Kitchen aspirin EC 325 MG tablet   Oral   Take 325 mg by mouth daily as needed (chest pain).         . fluticasone (FLONASE) 50 MCG/ACT nasal spray   Nasal   Place 2 sprays into the nose daily.         [name] Kitchen gabapentin (NEURONTIN) 300 MG capsule   Oral   Take 300 mg by mouth 2 (two) times daily.         [name] Kitchen gemfibrozil (LOPID) 600  MG tablet   Oral   Take 1 tablet (600 mg total) by mouth 2 (two) times daily.   60 tablet   5   . Hypromellose (ARTIFICIAL TEARS OP)   Ophthalmic   Apply 1 drop to eye daily as needed (itchy / watery eyes).         . Multiple Vitamins-Minerals (OCUVITE ADULT 50+ PO)   Oral   Take 1 capsule by mouth daily.           . pantoprazole (PROTONIX) 40 MG tablet   Oral   Take 1 tablet (40 mg total) by mouth 2 (two) times daily.   60 tablet   11   . Bevacizumab (AVASTIN) 100 MG/4ML SOLN   Intravenous   Inject into the vein. Inject into right eye as needed          . cetirizine (ZYRTEC) 10 MG tablet   Oral   Take 10 mg by mouth daily.           . hydrochlorothiazide (HYDRODIURIL) 25 MG tablet   Oral   Take 1 tablet (25 mg total) by mouth daily.   14 tablet   1     BP 177/82  Pulse 68  Temp(Src) 97.6 F (36.4 C) (Oral)  Resp 13  SpO2 95%  Physical Exam  Nursing note and vitals reviewed. Constitutional: He appears well-developed and well-nourished. No distress.  HENT:  Head: Normocephalic and atraumatic. No trismus in  the jaw.  Right Ear: External ear normal.  Left Ear: External ear normal.  Mouth/Throat: Mucous membranes are normal. Normal dentition. No dental abscesses or edematous. No oropharyngeal exudate, posterior oropharyngeal edema, posterior oropharyngeal erythema or tonsillar abscesses.  Unilateral edema of the right side of his upper and lower lip, mild edema of the right cheek, no erythema no tenderness to palpation  Eyes: Conjunctivae are normal. Right eye exhibits no discharge. Left eye exhibits no discharge. No scleral icterus.  Neck: Neck supple. No tracheal deviation present.  Cardiovascular: Normal rate, regular rhythm and intact distal pulses.   Pulmonary/Chest: Effort normal and breath sounds normal. No stridor. No respiratory distress. He has no wheezes. He has no rales.  Abdominal: Soft. Bowel sounds are normal. He exhibits no distension. There is no tenderness. There is no rebound and no guarding.  Musculoskeletal: He exhibits no edema and no tenderness.  Neurological: He is alert. He has normal strength. No sensory deficit. Cranial nerve deficit:  no gross defecits noted. He exhibits normal muscle tone. He displays no seizure activity. Coordination normal.  Skin: Skin is warm and dry. No rash noted.  Psychiatric: He has a normal mood and affect.    ED Course  Procedures (including critical care time) Medications  diphenhydrAMINE (BENADRYL) injection 12.5 mg (12.5 mg Intravenous Given 12/22/12 0829)  famotidine (PEPCID) IVPB 20 mg (0 mg Intravenous Stopped 12/22/12 0905)  methylPREDNISolone sodium succinate (SOLU-MEDROL) 125 mg/2 mL injection 125 mg (125 mg Intravenous Given 12/22/12 0831)    Labs Reviewed  POCT I-STAT, CHEM 8   No results found.   1. ACE inhibitor-aggravated angioedema, initial encounter       MDM  The patient's symptoms are consistent with angioedema. This is most likely related to his lisinopril. I will give him a dose of steroids and antihistamines.  The  patient will be monitored in the emergency department. As long as there is no progression the plan will be to have him discontinue his lisinopril .  The patient was monitored in  the emergency department for several hours. He had no real improvement after antihistamines and steroids but his symptoms did not seem to be progressing at all. I suspect his symptoms are related to his ACE inhibitor. It is typical for this not to respond to antihistamines. I will have him discontinue that medication. On follow up with his primary doctor and give him a prescription to take hydrochlorothiazide in the meantime.  The patient does feel comfortable going home. He has family members with him. They live just a couple of doors down from EMS station and has a paramedic. They've no concerns about the ability to get help if needed.        Celene Kras, MD 12/22/12 [phone]

## 2012-12-25 ENCOUNTER — Emergency Department (HOSPITAL_COMMUNITY): Payer: Medicare Other

## 2012-12-25 ENCOUNTER — Emergency Department (HOSPITAL_COMMUNITY)
Admission: EM | Admit: 2012-12-25 | Discharge: 2012-12-25 | Disposition: A | Discharge status: Home / Self Care | Payer: Medicare Other | Attending: Emergency Medicine | Admitting: Emergency Medicine

## 2012-12-25 ENCOUNTER — Telehealth: Payer: Self-pay | Admitting: Internal Medicine

## 2012-12-25 ENCOUNTER — Encounter (HOSPITAL_COMMUNITY): Payer: Self-pay | Admitting: Emergency Medicine

## 2012-12-25 DIAGNOSIS — Z7982 Long term (current) use of aspirin: Secondary | ICD-10-CM | POA: Insufficient documentation

## 2012-12-25 DIAGNOSIS — I10 Essential (primary) hypertension: Secondary | ICD-10-CM | POA: Insufficient documentation

## 2012-12-25 DIAGNOSIS — Z87448 Personal history of other diseases of urinary system: Secondary | ICD-10-CM | POA: Insufficient documentation

## 2012-12-25 DIAGNOSIS — Z8679 Personal history of other diseases of the circulatory system: Secondary | ICD-10-CM | POA: Insufficient documentation

## 2012-12-25 DIAGNOSIS — Z862 Personal history of diseases of the blood and blood-forming organs and certain disorders involving the immune mechanism: Secondary | ICD-10-CM | POA: Insufficient documentation

## 2012-12-25 DIAGNOSIS — R079 Chest pain, unspecified: Secondary | ICD-10-CM | POA: Insufficient documentation

## 2012-12-25 DIAGNOSIS — K219 Gastro-esophageal reflux disease without esophagitis: Secondary | ICD-10-CM | POA: Insufficient documentation

## 2012-12-25 DIAGNOSIS — E785 Hyperlipidemia, unspecified: Secondary | ICD-10-CM | POA: Insufficient documentation

## 2012-12-25 DIAGNOSIS — Z87891 Personal history of nicotine dependence: Secondary | ICD-10-CM | POA: Insufficient documentation

## 2012-12-25 DIAGNOSIS — Z8719 Personal history of other diseases of the digestive system: Secondary | ICD-10-CM | POA: Insufficient documentation

## 2012-12-25 DIAGNOSIS — Z8669 Personal history of other diseases of the nervous system and sense organs: Secondary | ICD-10-CM | POA: Insufficient documentation

## 2012-12-25 DIAGNOSIS — IMO0002 Reserved for concepts with insufficient information to code with codable children: Secondary | ICD-10-CM | POA: Insufficient documentation

## 2012-12-25 DIAGNOSIS — Z79899 Other long term (current) drug therapy: Secondary | ICD-10-CM | POA: Insufficient documentation

## 2012-12-25 DIAGNOSIS — Z8711 Personal history of peptic ulcer disease: Secondary | ICD-10-CM | POA: Insufficient documentation

## 2012-12-25 LAB — CBC
HCT: 45.9 % (ref 39.0–52.0)
Hemoglobin: 16.5 g/dL (ref 13.0–17.0)
MCH: 30.3 pg (ref 26.0–34.0)
MCHC: 35.9 g/dL (ref 30.0–36.0)
MCV: 84.2 fL (ref 78.0–100.0)
Platelets: 312 10*3/uL (ref 150–400)
RBC: 5.45 MIL/uL (ref 4.22–5.81)
RDW: 12.5 % (ref 11.5–15.5)
WBC: 7.4 10*3/uL (ref 4.0–10.5)

## 2012-12-25 LAB — BASIC METABOLIC PANEL
BUN: 27 mg/dL — ABNORMAL HIGH (ref 6–23)
CO2: 25 mEq/L (ref 19–32)
Calcium: 9.7 mg/dL (ref 8.4–10.5)
Chloride: 96 mEq/L (ref 96–112)
Creatinine, Ser: 1.16 mg/dL (ref 0.50–1.35)
GFR calc Af Amer: 64 mL/min — ABNORMAL LOW (ref >90)
GFR calc non Af Amer: 56 mL/min — ABNORMAL LOW (ref >90)
Glucose, Bld: 176 mg/dL — ABNORMAL HIGH (ref 70–99)
Potassium: 3.3 mEq/L — ABNORMAL LOW (ref 3.5–5.1)
Sodium: 134 mEq/L — ABNORMAL LOW (ref 135–145)

## 2012-12-25 LAB — POCT I-STAT TROPONIN I: Troponin i, poc: 0 ng/mL (ref 0.00–0.08)

## 2012-12-25 MED ORDER — HYDROCHLOROTHIAZIDE 25 MG PO TABS: 50.0000 mg | ORAL_TABLET | Freq: Every day | ORAL | Status: DC

## 2012-12-25 NOTE — Telephone Encounter (Signed)
Patient Information:  Caller Name: Kara Mead  Phone: [phone]  Patient: [name]  Gender: Male  DOB: 1926-06-21  Age: 79 Years  PCP: Illene Regulus (Adults only)  Office Follow Up:  Does the office need to follow up with this patient?: No  Instructions For The Office: N/A  RN Note:  sudden onset of "splitting headache" about 30 minutes ago.  Has some mild chest pain and wife said he is a little wobbley on his feet.  BP 181/83.  While triaging him he said the severe pain was letting up but he has a strange feeling in his head.  Symptoms  Reason For Call & Symptoms: headache  Reviewed Health History In EMR: Yes  Reviewed Medications In EMR: Yes  Reviewed Allergies In EMR: Yes  Reviewed Surgeries / Procedures: Yes  Date of Onset of Symptoms: 12/25/2012  Guideline(s) Used:  High Blood Pressure  Disposition Per Guideline:   Go to ED Now (or to Office with PCP Approval)  Reason For Disposition Reached:   BP > 160 / 100 and any cardiac or neurologic symptoms (e.g., chest pain, difficulty breathing, unsteady gait, blurred vision)  Advice Given:  N/A  RN Overrode Recommendation:  Go To ED  sent to ED

## 2012-12-25 NOTE — ED Notes (Signed)
Pt c/o midsternal CP, HA and fluctuations in BP; pt sts started on HCTZ on Saturday after having reaction to lisinopril; pt denies SOB at present

## 2012-12-25 NOTE — ED Provider Notes (Signed)
History     CSN: [id number]  Arrival date & time 12/25/12  4098   First MD Initiated Contact with Patient 12/25/12 1040      Chief Complaint  Patient presents with  . Headache  . Chest Pain    (Consider location/radiation/quality/duration/timing/severity/associated sxs/prior treatment) HPI Pt seen 5/3 for angioedema thought to be due to lisinopril. Lisinopril d/c'd and pt started on HCTZ 25 mg. Pt states his SBP has been running between 150's-180's. States when it is elevated he has mild throbbing frontal HA. He currently has no pain, weakness, numbness, N/V, visual changes. Pt admits to mild "gas" pain on R side of his chest last night after eating. Currently no pain. No lower ext swelling or pain.  Past Medical History  Diagnosis Date  . Barrett esophagus 09/2003  . GERD (gastroesophageal reflux disease)   . Hemorrhoids   . Diverticulosis   . Hyperlipidemia   . Hypertension   . Von Willebrand disease   . PUD (peptic ulcer disease)   . Hearing loss   . GI AVM (gastrointestinal arteriovenous vascular malformation)   . History of prostatitis   . Hemorrhoids   . Macular degeneration, wet     receiving intra-ocular injections ([facility])    Past Surgical History  Procedure Laterality Date  . Inguinal hernia repair  2001    Family History  Problem Relation Age of Onset  . Heart attack Father   . Coronary artery disease Father   . Hypertension Mother   . Pancreatic cancer Mother     Died at 44.  . Cancer Mother     Pancreatic cancer  . Arthritis Sister   . Hyperlipidemia Sister   . Prostate cancer Brother   . Cancer Brother     Bone Cancer secondary to Prostate Cancer  . Diabetes Neg Hx     History  Substance Use Topics  . Smoking status: Former Games developer  . Smokeless tobacco: Never Used     Comment: Quit 30 years ago as of 2014  . Alcohol Use: No      Review of Systems  Constitutional: Negative for fever, chills and fatigue.  HENT: Negative for neck pain.    Eyes: Negative for visual disturbance.  Respiratory: Negative for cough, chest tightness and shortness of breath.   Cardiovascular: Positive for chest pain. Negative for palpitations and leg swelling.  Gastrointestinal: Negative for nausea, vomiting, abdominal pain and diarrhea.  Genitourinary: Negative for dysuria.  Musculoskeletal: Negative for myalgias and back pain.  Skin: Negative for pallor, rash and wound.  Neurological: Positive for headaches. Negative for dizziness, syncope, weakness, light-headedness and numbness.  All other systems reviewed and are negative.    Allergies  Atorvastatin; Lisinopril; and Statins  Home Medications   Current Outpatient Rx  Name  Route  Sig  Dispense  Refill  . acetaminophen (TYLENOL) 500 MG tablet   Oral   Take 1,000 mg by mouth every 6 (six) hours as needed for pain.         [name] Kitchen amLODipine (NORVASC) 10 MG tablet   Oral   Take 1 tablet (10 mg total) by mouth daily.   30 tablet   5   . aspirin 81 MG tablet   Oral   Take 81 mg by mouth at bedtime.          . cetirizine (ZYRTEC) 10 MG tablet   Oral   Take 10 mg by mouth daily as needed for allergies.          [name] Kitchen  gabapentin (NEURONTIN) 300 MG capsule   Oral   Take 300 mg by mouth 3 (three) times daily.          [name] Kitchen gemfibrozil (LOPID) 600 MG tablet   Oral   Take 1 tablet (600 mg total) by mouth 2 (two) times daily.   60 tablet   5   . Multiple Vitamins-Minerals (OCUVITE ADULT 50+ PO)   Oral   Take 1 capsule by mouth daily.           . pantoprazole (PROTONIX) 40 MG tablet   Oral   Take 1 tablet (40 mg total) by mouth 2 (two) times daily.   60 tablet   11   . aspirin EC 325 MG tablet   Oral   Take 325 mg by mouth daily as needed (chest pain).         . Bevacizumab (AVASTIN IV)   Right Eye   Place into the right eye See admin instructions. Every 7 weeks         . fluticasone (FLONASE) 50 MCG/ACT nasal spray   Nasal   Place 2 sprays into the nose daily as  needed for allergies.          . hydrochlorothiazide (HYDRODIURIL) 25 MG tablet   Oral   Take 2 tablets (50 mg total) by mouth daily.   30 tablet   1   . Hypromellose (ARTIFICIAL TEARS OP)   Ophthalmic   Apply 1 drop to eye daily as needed (itchy / watery eyes).           BP 160/74  Pulse 78  Temp(Src) 98.1 F (36.7 C) (Oral)  Resp 12  SpO2 94%  Physical Exam  Nursing note and vitals reviewed. Constitutional: He is oriented to person, place, and time. He appears well-developed and well-nourished. No distress.  HENT:  Head: Normocephalic and atraumatic.  Mouth/Throat: Oropharynx is clear and moist.  Eyes: EOM are normal. Pupils are equal, round, and reactive to light.  Neck: Normal range of motion. Neck supple.  Cardiovascular: Normal rate and regular rhythm.  Exam reveals no gallop and no friction rub.   No murmur heard. Pulmonary/Chest: Effort normal and breath sounds normal. No respiratory distress. He has no wheezes. He has no rales. He exhibits no tenderness.  Abdominal: Soft. Bowel sounds are normal. He exhibits no distension and no mass. There is no tenderness. There is no rebound and no guarding.  Musculoskeletal: Normal range of motion. He exhibits no edema and no tenderness.  No lower ext swelling or pain  Neurological: He is alert and oriented to person, place, and time.  5/5 motor in all ext, sensation intact, Cn II-XII intact  Skin: Skin is warm and dry. No rash noted. No erythema.  Psychiatric: He has a normal mood and affect. His behavior is normal.    ED Course  Procedures (including critical care time)  Labs Reviewed  BASIC METABOLIC PANEL - Abnormal; Notable for the following:    Sodium 134 (*)    Potassium 3.3 (*)    Glucose, Bld 176 (*)    BUN 27 (*)    GFR calc non Af Amer 56 (*)    GFR calc Af Amer 64 (*)    All other components within normal limits  CBC  POCT I-STAT TROPONIN I   Dg Chest 2 View  12/25/2012  *RADIOLOGY REPORT*  Clinical  Data: Headache, chest pain  CHEST - 2 VIEW  Comparison: 02/24/2009  Findings: Cardiomediastinal silhouette  is stable.  Mild hyperinflation again noted.  No acute infiltrate or pulmonary edema.  Small hiatal hernia.  IMPRESSION: No active disease.  Small hiatal hernia.   Original Report Authenticated By: Natasha Mead, M.D.      1. Uncontrolled hypertension      Date: 12/25/2012  Rate: 92  Rhythm: normal sinus rhythm  QRS Axis: normal  Intervals: normal  ST/T Wave abnormalities: normal  Conduction Disutrbances:none  Narrative Interpretation:   Old EKG Reviewed: none available     MDM  Trop neg > 12 hours after chest pain, normal EKG. Doubt ACS. Pt BP elevated in ED. Asymptomatic. Will increase HCTZ and have advised to continue to record BP and f/u with PMD for management of BP. Given lack of HA, normal neuro exam, do not think further workup of HA needed at this time. Pt given return precautions.         Loren Racer, MD 12/25/12 1144

## 2012-12-25 NOTE — Telephone Encounter (Signed)
Agree. Thx 

## 2012-12-28 ENCOUNTER — Ambulatory Visit (INDEPENDENT_AMBULATORY_CARE_PROVIDER_SITE_OTHER): Payer: Medicare Other | Admitting: Internal Medicine

## 2012-12-28 ENCOUNTER — Encounter: Payer: Self-pay | Admitting: Internal Medicine

## 2012-12-28 VITALS — BP 168/80 | HR 80 | Temp 97.7°F | Wt 162.4 lb

## 2012-12-28 DIAGNOSIS — E876 Hypokalemia: Secondary | ICD-10-CM

## 2012-12-28 DIAGNOSIS — I10 Essential (primary) hypertension: Secondary | ICD-10-CM

## 2012-12-28 DIAGNOSIS — T783XXD Angioneurotic edema, subsequent encounter: Secondary | ICD-10-CM

## 2012-12-28 DIAGNOSIS — T783XXA Angioneurotic edema, initial encounter: Secondary | ICD-10-CM | POA: Insufficient documentation

## 2012-12-28 DIAGNOSIS — Z5189 Encounter for other specified aftercare: Secondary | ICD-10-CM

## 2012-12-28 MED ORDER — METOPROLOL SUCCINATE ER 25 MG PO TB24: 25.0000 mg | ORAL_TABLET | Freq: Every day | ORAL | Status: DC

## 2012-12-28 MED ORDER — POTASSIUM CHLORIDE CRYS ER 20 MEQ PO TBCR: 20.0000 meq | EXTENDED_RELEASE_TABLET | Freq: Every day | ORAL | Status: DC

## 2012-12-28 MED ORDER — TRIAMTERENE-HCTZ 50-25 MG PO CAPS: 1.0000 | ORAL_CAPSULE | Freq: Every day | ORAL | Status: DC

## 2012-12-28 NOTE — Progress Notes (Signed)
Subjective:     Patient ID: [name], male   DOB: 1926/08/05, 79 y.o.   MRN: [id number]  HPI  C/o elev BP and not feeling well  He hd a reaction due to ACE F/u on low potassium  BP Readings from Last 3 Encounters:  12/28/12 168/80  12/25/12 146/78  12/22/12 177/82    Review of Systems  Constitutional: Positive for fatigue. Negative for appetite change and unexpected weight change.  HENT: Negative for nosebleeds, congestion, sore throat, sneezing, trouble swallowing and neck pain.   Eyes: Negative for itching and visual disturbance.  Respiratory: Negative for cough.   Cardiovascular: Negative for chest pain, palpitations and leg swelling.  Gastrointestinal: Negative for nausea, diarrhea, blood in stool and abdominal distention.  Genitourinary: Negative for frequency and hematuria.  Musculoskeletal: Negative for back pain, joint swelling and gait problem.  Skin: Negative for rash.  Neurological: Positive for dizziness. Negative for tremors, speech difficulty and weakness.  Psychiatric/Behavioral: Negative for sleep disturbance, dysphoric mood and agitation. The patient is not nervous/anxious.        Objective:   Physical Exam  Constitutional: He is oriented to person, place, and time. He appears well-developed.  HENT:  Mouth/Throat: Oropharynx is clear and moist.  Eyes: Conjunctivae are normal. Pupils are equal, round, and reactive to light.  Neck: Normal range of motion. No JVD present. No thyromegaly present.  Cardiovascular: Normal rate, regular rhythm, normal heart sounds and intact distal pulses.  Exam reveals no gallop and no friction rub.   No murmur heard. Pulmonary/Chest: Effort normal and breath sounds normal. No respiratory distress. He has no wheezes. He has no rales. He exhibits no tenderness.  Abdominal: Soft. Bowel sounds are normal. He exhibits no distension and no mass. There is no tenderness. There is no rebound and no guarding.  Musculoskeletal: Normal  range of motion. He exhibits no edema and no tenderness.  Lymphadenopathy:    He has no cervical adenopathy.  Neurological: He is alert and oriented to person, place, and time. He has normal reflexes. No cranial nerve deficit. He exhibits normal muscle tone. Coordination normal.  Skin: Skin is warm and dry. No rash noted. No erythema.  Psychiatric: He has a normal mood and affect. His behavior is normal. Judgment and thought content normal.   Lab Results  Component Value Date   WBC 7.4 12/25/2012   HGB 16.5 12/25/2012   HCT 45.9 12/25/2012   PLT 312 12/25/2012   GLUCOSE 176* 12/25/2012   CHOL 234* 08/13/2012   TRIG 115.0 08/13/2012   HDL 47.00 08/13/2012   LDLDIRECT 169.6 08/13/2012   LDLCALC 100* 06/26/2007   ALT 15 08/13/2012   AST 26 08/13/2012   NA 134* 12/25/2012   K 3.3* 12/25/2012   CL 96 12/25/2012   CREATININE 1.16 12/25/2012   BUN 27* 12/25/2012   CO2 25 12/25/2012   TSH 2.96 08/11/2011   PSA 2.47 07/27/2009       Assessment:         Plan:

## 2012-12-28 NOTE — Assessment & Plan Note (Signed)
Resolving

## 2012-12-28 NOTE — Assessment & Plan Note (Signed)
Start KCl RTC 2 wks w/labs

## 2012-12-28 NOTE — Assessment & Plan Note (Addendum)
D/c HCTZ Start Dyazide Start Toprol XL Cont Norvasc NAS diet

## 2013-01-22 ENCOUNTER — Encounter: Payer: Self-pay | Admitting: Internal Medicine

## 2013-01-22 ENCOUNTER — Ambulatory Visit (INDEPENDENT_AMBULATORY_CARE_PROVIDER_SITE_OTHER): Payer: Medicare Other | Admitting: Internal Medicine

## 2013-01-22 VITALS — BP 156/78 | HR 56 | Temp 96.8°F | Resp 12 | Wt 164.0 lb

## 2013-01-22 DIAGNOSIS — G8929 Other chronic pain: Secondary | ICD-10-CM

## 2013-01-22 DIAGNOSIS — I10 Essential (primary) hypertension: Secondary | ICD-10-CM

## 2013-01-22 DIAGNOSIS — R109 Unspecified abdominal pain: Secondary | ICD-10-CM | POA: Insufficient documentation

## 2013-01-22 MED ORDER — FUROSEMIDE 40 MG PO TABS: 40.0000 mg | ORAL_TABLET | Freq: Every day | ORAL | Status: DC

## 2013-01-22 NOTE — Progress Notes (Signed)
Subjective:    Patient ID: [name], male    DOB: 11/10/1925, 79 y.o.   MRN: [id number]  HPI [name] presents for follow up of blood pressure: he was seen May 1 - BP up and started lisinopril, seen May 3rd for angioedema, treated with antihistamine and steroids; seen May 6th for increased BP - had full work-up with normal CE, normal EKG, normal chest x-ray and normal Bmet. HCTZ added to regimen. Seen May 9th Dr. AVP -  Changed to maxzide and added toprol XL 25 mg daily.   He continues to have elevated about elevated SBP but he has been asymptomatic.   He has question about gabapentin and long term use.  He has left flank pain - intermittent. Goes away with rest.   Past Medical History  Diagnosis Date  . Barrett esophagus 09/2003  . GERD (gastroesophageal reflux disease)   . Hemorrhoids   . Diverticulosis   . Hyperlipidemia   . Hypertension   . Von Willebrand disease   . PUD (peptic ulcer disease)   . Hearing loss   . GI AVM (gastrointestinal arteriovenous vascular malformation)   . History of prostatitis   . Hemorrhoids   . Macular degeneration, wet     receiving intra-ocular injections ([facility])   Past Surgical History  Procedure Laterality Date  . Inguinal hernia repair  2000/01/30   Family History  Problem Relation Age of Onset  . Heart attack Father   . Coronary artery disease Father   . Hypertension Mother   . Pancreatic cancer Mother     Died at 31.  . Cancer Mother     Pancreatic cancer  . Arthritis Sister   . Hyperlipidemia Sister   . Prostate cancer Brother   . Cancer Brother     Bone Cancer secondary to Prostate Cancer  . Diabetes Neg Hx    History   Social History  . Marital Status: Married    Spouse Name: [name]    Number of Children: N/A  . Years of Education: N/A   Occupational History  . Retired   .     Social History Main Topics  . Smoking status: Former Games developer  . Smokeless tobacco: Never Used     Comment: Quit 30 years ago as of 29-Jan-2013   . Alcohol Use: No  . Drug Use: No  . Sexually Active: Not Currently   Other Topics Concern  . Not on file   Social History Narrative   6th grade. Married 1951-01-30 1 son, 1 dtr - died at 5 months. Work - Management consultant, Engineer, drilling, Catering manager.   Retired-fulltime. "[employer]". ACP - yes DNR, yes short-term mechanical ventilarion, no heroic or futile measures in face of irreversible.                Current Outpatient Prescriptions on File Prior to Visit  Medication Sig Dispense Refill  . acetaminophen (TYLENOL) 500 MG tablet Take 1,000 mg by mouth every 6 (six) hours as needed for pain.      [name] Kitchen amLODipine (NORVASC) 10 MG tablet Take 1 tablet (10 mg total) by mouth daily.  30 tablet  5  . aspirin 81 MG tablet Take 81 mg by mouth at bedtime.       [name] Kitchen aspirin EC 325 MG tablet Take 325 mg by mouth daily as needed (chest pain).      . Bevacizumab (AVASTIN IV) Place into the right eye See admin instructions. Every 7 weeks      .  cetirizine (ZYRTEC) 10 MG tablet Take 10 mg by mouth daily as needed for allergies.       . fluticasone (FLONASE) 50 MCG/ACT nasal spray Place 2 sprays into the nose daily as needed for allergies.       [name] Kitchen gabapentin (NEURONTIN) 300 MG capsule Take 300 mg by mouth 3 (three) times daily.       [name] Kitchen gemfibrozil (LOPID) 600 MG tablet Take 1 tablet (600 mg total) by mouth 2 (two) times daily.  60 tablet  5  . Hypromellose (ARTIFICIAL TEARS OP) Apply 1 drop to eye daily as needed (itchy / watery eyes).      . metoprolol succinate (TOPROL-XL) 25 MG 24 hr tablet Take 1 tablet (25 mg total) by mouth daily. take at hs  30 tablet  11  . Multiple Vitamins-Minerals (OCUVITE ADULT 50+ PO) Take 1 capsule by mouth daily.        . pantoprazole (PROTONIX) 40 MG tablet Take 1 tablet (40 mg total) by mouth 2 (two) times daily.  60 tablet  11  . potassium chloride SA (K-DUR,KLOR-CON) 20 MEQ tablet Take 1 tablet (20 mEq total) by mouth daily.  30 tablet  3  . triamterene-hydrochlorothiazide  (DYAZIDE) 50-25 MG per capsule Take 1 capsule by mouth daily.  30 capsule  11  . [DISCONTINUED] lisinopril (PRINIVIL,ZESTRIL) 10 MG tablet Take 1 tablet (10 mg total) by mouth daily.  30 tablet  0   No current facility-administered medications on file prior to visit.      Review of Systems System review is negative for any constitutional, cardiac, pulmonary, GI or neuro symptoms or complaints other than as described in the HPI.     Objective:   Physical Exam Filed Vitals:   01/22/13 1034  BP: 156/78  Pulse: 56  Temp: 96.8 F (36 C)  Resp: 12   Wt Readings from Last 3 Encounters:  01/22/13 164 lb (74.39 kg)  12/28/12 162 lb 6.4 oz (73.664 kg)  12/20/12 169 lb (76.658 kg)   gen'l - WNWD white man younger than his years HEENT_ C&S clear Cor- 2+ radial, RRR, no JVD Pulm - normal respirations Abd - BS+, no guarding, no tenderness to deep palpation, no tenderness to percussion left flank Neuro - A&O x 3       Assessment & Plan:

## 2013-01-22 NOTE — Assessment & Plan Note (Signed)
Chronic intermittent left flank pain with anterior radiation.  PLan U/s renal to r/o mass or structual defect.

## 2013-01-22 NOTE — Assessment & Plan Note (Signed)
Not well controlled: elevated SBP. Reviewed meds; reviewed renal role in BP control  Plan Continue norvasc and metroprol XL  Change diuretic: stop triameterene/HCT and start furosemide 40 mg daily  Continue potassium  Keep a record of BP, return in 2-3 weeks.

## 2013-01-22 NOTE — Patient Instructions (Addendum)
1. Blood pressure - Not well controlled: elevated SBP. Reviewed meds; reviewed renal role in BP control  Plan Continue norvasc and metroprol XL  Change diuretic: stop triameterene/HCT and start furosemide 40 mg daily  Continue potassium  Keep a record of BP, return in 2-3 weeks.   2. Back pain - gabapentin is safe to take over the long term. We do need to watch for balance and dizziness problems You are on a low dose now.  3. Flank pain left - normal exam. Normal CT scan in '09 Plan Will check ultrasound of the kidney to make sure there is no structual problems.

## 2013-01-24 ENCOUNTER — Ambulatory Visit
Admission: RE | Admit: 2013-01-24 | Discharge: 2013-01-24 | Disposition: A | Discharge status: Home / Self Care | Payer: Medicare Other | Source: Ambulatory Visit | Attending: Internal Medicine | Admitting: Internal Medicine

## 2013-01-24 DIAGNOSIS — G8929 Other chronic pain: Secondary | ICD-10-CM

## 2013-01-24 DIAGNOSIS — I10 Essential (primary) hypertension: Secondary | ICD-10-CM

## 2013-01-25 ENCOUNTER — Telehealth: Payer: Self-pay

## 2013-01-25 ENCOUNTER — Other Ambulatory Visit: Payer: Self-pay

## 2013-01-25 MED ORDER — AMLODIPINE BESYLATE 10 MG PO TABS: 10.0000 mg | ORAL_TABLET | Freq: Every day | ORAL | Status: DC

## 2013-01-25 NOTE — Telephone Encounter (Signed)
Message copied by Noreene Larsson on Fri Jan 25, 2013 10:41 AM ------      Message from: Jacques Navy      Created: Fri Jan 25, 2013 10:32 AM       Please call patient: u/s kidneys - normal. Source of your flank pain remains a mystery. ------

## 2013-01-25 NOTE — Telephone Encounter (Signed)
Notified pt's wife since he is hard of hearing.

## 2013-02-19 ENCOUNTER — Ambulatory Visit (INDEPENDENT_AMBULATORY_CARE_PROVIDER_SITE_OTHER): Payer: Medicare Other | Admitting: Internal Medicine

## 2013-02-19 ENCOUNTER — Encounter: Payer: Self-pay | Admitting: Internal Medicine

## 2013-02-19 VITALS — BP 150/70 | HR 71 | Temp 97.5°F | Resp 12 | Ht 69.0 in | Wt 167.2 lb

## 2013-02-19 DIAGNOSIS — I10 Essential (primary) hypertension: Secondary | ICD-10-CM

## 2013-02-19 NOTE — Progress Notes (Signed)
  Subjective:    Patient ID: [name], male    DOB: Feb 29, 1926, 79 y.o.   MRN: [id number]  HPI Mr. Olazabal returns for BP check after recent adjustment in his medications. He has been feeling well: no chest pain, SOB/DOE, generalized weakness  PMH, FamHx and SocHx reviewed for any changes and relevance.  Current Outpatient Prescriptions on File Prior to Visit  Medication Sig Dispense Refill  . acetaminophen (TYLENOL) 500 MG tablet Take 1,000 mg by mouth every 6 (six) hours as needed for pain.      [name] Kitchen amLODipine (NORVASC) 10 MG tablet Take 1 tablet (10 mg total) by mouth daily.  30 tablet  5  . aspirin 81 MG tablet Take 81 mg by mouth at bedtime.       [name] Kitchen aspirin EC 325 MG tablet Take 325 mg by mouth daily as needed (chest pain).      . Bevacizumab (AVASTIN IV) Place into the right eye See admin instructions. Every 7 weeks      . cetirizine (ZYRTEC) 10 MG tablet Take 10 mg by mouth daily as needed for allergies.       . fluticasone (FLONASE) 50 MCG/ACT nasal spray Place 2 sprays into the nose daily as needed for allergies.       . furosemide (LASIX) 40 MG tablet Take 1 tablet (40 mg total) by mouth daily.  30 tablet  3  . gabapentin (NEURONTIN) 300 MG capsule Take 300 mg by mouth 3 (three) times daily.       [name] Kitchen gemfibrozil (LOPID) 600 MG tablet Take 1 tablet (600 mg total) by mouth 2 (two) times daily.  60 tablet  5  . Hypromellose (ARTIFICIAL TEARS OP) Apply 1 drop to eye daily as needed (itchy / watery eyes).      . metoprolol succinate (TOPROL-XL) 25 MG 24 hr tablet Take 1 tablet (25 mg total) by mouth daily. take at hs  30 tablet  11  . Multiple Vitamins-Minerals (OCUVITE ADULT 50+ PO) Take 1 capsule by mouth daily.        . pantoprazole (PROTONIX) 40 MG tablet Take 1 tablet (40 mg total) by mouth 2 (two) times daily.  60 tablet  11  . potassium chloride SA (K-DUR,KLOR-CON) 20 MEQ tablet Take 1 tablet (20 mEq total) by mouth daily.  30 tablet  3  . [DISCONTINUED] lisinopril  (PRINIVIL,ZESTRIL) 10 MG tablet Take 1 tablet (10 mg total) by mouth daily.  30 tablet  0   No current facility-administered medications on file prior to visit.      Review of Systems System review is negative for any constitutional, cardiac, pulmonary, GI or neuro symptoms or complaints other than as described in the HPI. He does continue to c/o rhinorrhea    Objective:   Physical Exam Filed Vitals:   02/19/13 0924  BP: 150/70  Pulse: 71  Temp: 97.5 F (36.4 C)  Resp: 12   Gen'l - WNWD white man in no distress HEENT - [place]/AT, C&S clear, lens pin-point hindering exam (further efforts deferred to ophthalmology) Cor - RRR Pulm - normal respiratoins Neuro - A&O x 3       Assessment & Plan:

## 2013-02-19 NOTE — Assessment & Plan Note (Signed)
Plan Continue the same dose of amlodipine and furosemide  Increase Metoprolol ER to 50 mg ( may take to 25 mg tablets together until gone)  Report back as to control and for any side effects: drowsiness, slow heart rate (needs to be above 60 at rest), light-headed.

## 2013-02-19 NOTE — Patient Instructions (Addendum)
Blood pressure:  BP Readings from Last 3 Encounters:  02/19/13 150/70  01/22/13 156/78  12/28/12 168/80   Not quite at goal.   Plan Continue the same dose of amlodipine and furosemide  Increase Metoprolol ER to 50 mg ( may take to 25 mg tablets together until gone)  Report back as to control and for any side effects: drowsiness, slow heart rate (needs to be above 60 at rest), light-headed.

## 2013-02-26 ENCOUNTER — Telehealth: Payer: Self-pay

## 2013-02-26 NOTE — Telephone Encounter (Signed)
Phone call from patient's wife stating she needs to give a report on his BP readings. She states the highest it has been is 170/74 and the lowest it's been is 130/63. But mostly it's been running in the 140's or 150's,

## 2013-02-26 NOTE — Telephone Encounter (Signed)
These BP readings are satisfactory. Continue present medications.

## 2013-02-27 MED ORDER — METOPROLOL SUCCINATE ER 25 MG PO TB24: 50.0000 mg | ORAL_TABLET | Freq: Every day | ORAL | Status: DC

## 2013-02-27 NOTE — Telephone Encounter (Signed)
Patient's wife notified and she stated he needed a new prescription for Metoprolol succinate sent to [facility] pharmacy since he is now taking 2 tablets daily. This was done.

## 2013-03-12 ENCOUNTER — Telehealth: Payer: Self-pay | Admitting: *Deleted

## 2013-03-12 DIAGNOSIS — K2270 Barrett's esophagus without dysplasia: Secondary | ICD-10-CM

## 2013-03-12 MED ORDER — PANTOPRAZOLE SODIUM 40 MG PO TBEC: 40.0000 mg | DELAYED_RELEASE_TABLET | Freq: Every day | ORAL | Status: DC

## 2013-03-12 NOTE — Telephone Encounter (Signed)
Rx sent to pharmacy, wife advised

## 2013-03-12 NOTE — Telephone Encounter (Signed)
Ok for generic protonix 40 mg q AM, #30, refill x 11

## 2013-03-12 NOTE — Telephone Encounter (Signed)
Pts wife called requesting a refill on Protonix. It appears the Rx has been discontinued.  Please advise

## 2013-03-12 NOTE — Telephone Encounter (Signed)
Rx sent to pharmacy, wife informed

## 2013-03-25 ENCOUNTER — Telehealth: Payer: Self-pay | Admitting: Internal Medicine

## 2013-03-25 NOTE — Telephone Encounter (Signed)
Rec'd from [facility] forward 3 pages to Dr. Norins °

## 2013-04-18 ENCOUNTER — Other Ambulatory Visit: Payer: Self-pay

## 2013-04-18 MED ORDER — FUROSEMIDE 40 MG PO TABS: 40.0000 mg | ORAL_TABLET | Freq: Every day | ORAL | Status: DC

## 2013-05-09 ENCOUNTER — Other Ambulatory Visit: Payer: Self-pay

## 2013-05-09 MED ORDER — GEMFIBROZIL 600 MG PO TABS: 600.0000 mg | ORAL_TABLET | Freq: Two times a day (BID) | ORAL | Status: DC

## 2013-05-29 ENCOUNTER — Other Ambulatory Visit: Payer: Self-pay

## 2013-05-29 MED ORDER — GABAPENTIN 300 MG PO CAPS: 300.0000 mg | ORAL_CAPSULE | Freq: Three times a day (TID) | ORAL | Status: DC

## 2013-05-30 ENCOUNTER — Other Ambulatory Visit: Payer: Self-pay

## 2013-05-30 MED ORDER — POTASSIUM CHLORIDE CRYS ER 20 MEQ PO TBCR: 20.0000 meq | EXTENDED_RELEASE_TABLET | Freq: Every day | ORAL | Status: DC

## 2013-06-26 ENCOUNTER — Ambulatory Visit (INDEPENDENT_AMBULATORY_CARE_PROVIDER_SITE_OTHER): Payer: Medicare Other

## 2013-06-26 DIAGNOSIS — Z23 Encounter for immunization: Secondary | ICD-10-CM

## 2013-08-12 ENCOUNTER — Other Ambulatory Visit (INDEPENDENT_AMBULATORY_CARE_PROVIDER_SITE_OTHER): Payer: Medicare Other

## 2013-08-12 ENCOUNTER — Ambulatory Visit (INDEPENDENT_AMBULATORY_CARE_PROVIDER_SITE_OTHER): Payer: Medicare Other | Admitting: Internal Medicine

## 2013-08-12 ENCOUNTER — Encounter: Payer: Self-pay | Admitting: Internal Medicine

## 2013-08-12 VITALS — BP 160/88 | HR 63 | Temp 96.8°F | Ht 68.0 in | Wt 162.0 lb

## 2013-08-12 DIAGNOSIS — E876 Hypokalemia: Secondary | ICD-10-CM

## 2013-08-12 DIAGNOSIS — E785 Hyperlipidemia, unspecified: Secondary | ICD-10-CM

## 2013-08-12 DIAGNOSIS — K5520 Angiodysplasia of colon without hemorrhage: Secondary | ICD-10-CM

## 2013-08-12 DIAGNOSIS — I10 Essential (primary) hypertension: Secondary | ICD-10-CM

## 2013-08-12 DIAGNOSIS — K2270 Barrett's esophagus without dysplasia: Secondary | ICD-10-CM

## 2013-08-12 DIAGNOSIS — Z23 Encounter for immunization: Secondary | ICD-10-CM

## 2013-08-12 DIAGNOSIS — Z0000 Encounter for general adult medical examination without abnormal findings: Secondary | ICD-10-CM

## 2013-08-12 LAB — HEMOGLOBIN AND HEMATOCRIT, BLOOD
HCT: 46.4 % (ref 39.0–52.0)
Hemoglobin: 15.3 g/dL (ref 13.0–17.0)

## 2013-08-12 LAB — HEPATIC FUNCTION PANEL
ALT: 17 U/L (ref 0–53)
AST: 25 U/L (ref 0–37)
Albumin: 4.3 g/dL (ref 3.5–5.2)
Alkaline Phosphatase: 84 U/L (ref 39–117)
Bilirubin, Direct: 0.1 mg/dL (ref 0.0–0.3)
Total Bilirubin: 0.6 mg/dL (ref 0.3–1.2)
Total Protein: 8 g/dL (ref 6.0–8.3)

## 2013-08-12 LAB — COMPREHENSIVE METABOLIC PANEL
ALT: 17 U/L (ref 0–53)
AST: 25 U/L (ref 0–37)
Albumin: 4.3 g/dL (ref 3.5–5.2)
Alkaline Phosphatase: 84 U/L (ref 39–117)
BUN: 15 mg/dL (ref 6–23)
CO2: 27 mEq/L (ref 19–32)
Calcium: 9.2 mg/dL (ref 8.4–10.5)
Chloride: 104 mEq/L (ref 96–112)
Creatinine, Ser: 1.2 mg/dL (ref 0.4–1.5)
GFR: 61.04 mL/min (ref >60.00)
Glucose, Bld: 100 mg/dL — ABNORMAL HIGH (ref 70–99)
Potassium: 3.7 mEq/L (ref 3.5–5.1)
Sodium: 140 mEq/L (ref 135–145)
Total Bilirubin: 0.6 mg/dL (ref 0.3–1.2)
Total Protein: 8 g/dL (ref 6.0–8.3)

## 2013-08-12 LAB — LIPID PANEL
Cholesterol: 233 mg/dL — ABNORMAL HIGH (ref 0–200)
HDL: 42.1 mg/dL (ref >39.00)
Total CHOL/HDL Ratio: 6
Triglycerides: 145 mg/dL (ref 0.0–149.0)
VLDL: 29 mg/dL (ref 0.0–40.0)

## 2013-08-12 LAB — TSH: TSH: 4.41 u[IU]/mL (ref 0.35–5.50)

## 2013-08-12 LAB — LDL CHOLESTEROL, DIRECT: Direct LDL: 167.2 mg/dL

## 2013-08-12 MED ORDER — RANITIDINE HCL 150 MG PO TABS: 150.0000 mg | ORAL_TABLET | Freq: Two times a day (BID) | ORAL | Status: DC

## 2013-08-12 MED ORDER — DILTIAZEM HCL ER 240 MG PO CP24: 240.0000 mg | ORAL_CAPSULE | Freq: Every day | ORAL | Status: DC

## 2013-08-12 NOTE — Assessment & Plan Note (Signed)
BP has been running a bit high.  Plan Change amlodipine to diltiazem ER 240  Follow up BP with increase in dosage as needed.

## 2013-08-12 NOTE — Progress Notes (Signed)
Pre visit review using our clinic review tool, if applicable. No additional management support is needed unless otherwise documented below in the visit note. 

## 2013-08-12 NOTE — Progress Notes (Signed)
Subjective:    Patient ID: [name], male    DOB: 16-Sep-1925, 79 y.o.   MRN: [id number]  HPI The patient is here for annual Medicare wellness examination and management of other chronic and acute problems.  Blood pressure has been running a bit too high. He is having more heartburn/reflux. He has had intermittent lancinating chest pain that occurs in various locations.    The risk factors are reflected in the social history.  The roster of all physicians providing medical care to patient - is listed in the Snapshot section of the chart.  Activities of daily living:  The patient is 100% inedpendent in all ADLs: dressing, toileting, feeding as well as independent mobility  Home safety : The patient has smoke detectors in the home. They wear seatbelts. firearms are present in the home, kept in a safe fashion. There is no violence in the home.   There is no risks for hepatitis, STDs or HIV. There is no history of blood transfusion. They have no travel history to infectious disease endemic areas of the world.  The patient has seen their dentist in the last six month. They have seen their eye doctor in the last year. He is getting intraocular injections.They deny any hearing difficulty and have not had audiologic testing in the last year.    They do not  have excessive sun exposure. Discussed the need for sun protection: hats, long sleeves and use of sunscreen if there is significant sun exposure.   Diet: the importance of a healthy diet is discussed. They do have a healthy diet.  The patient has no regular exercise program.  The benefits of regular aerobic exercise were discussed.  Depression screen: there are no signs or vegative symptoms of depression- irritability, change in appetite, anhedonia, sadness/tearfullness.  Cognitive assessment: the patient manages all their financial and personal affairs and is actively engaged.   The following portions of the patient's history were  reviewed and updated as appropriate: allergies, current medications, past family history, past medical history,  past surgical history, past social history  and problem list.  Vision, hearing, body mass index were assessed and reviewed.   During the course of the visit the patient was educated and counseled about appropriate screening and preventive services including : fall prevention , diabetes screening, nutrition counseling, colorectal cancer screening, and recommended immunizations.  Past Medical History  Diagnosis Date  . Barrett esophagus 09/2003  . GERD (gastroesophageal reflux disease)   . Hemorrhoids   . Diverticulosis   . Hyperlipidemia   . Hypertension   . Von Willebrand disease   . PUD (peptic ulcer disease)   . Hearing loss   . GI AVM (gastrointestinal arteriovenous vascular malformation)   . History of prostatitis   . Hemorrhoids   . Macular degeneration, wet     receiving intra-ocular injections ([facility])   Past Surgical History  Procedure Laterality Date  . Inguinal hernia repair  2001   Family History  Problem Relation Age of Onset  . Heart attack Father   . Coronary artery disease Father   . Hypertension Mother   . Pancreatic cancer Mother     Died at 41.  . Cancer Mother     Pancreatic cancer  . Arthritis Sister   . Hyperlipidemia Sister   . Prostate cancer Brother   . Cancer Brother     Bone Cancer secondary to Prostate Cancer  . Diabetes Neg Hx    History   Social  History  . Marital Status: Married    Spouse Name: Croix Presley    Number of Children: N/A  . Years of Education: N/A   Occupational History  . Retired   .     Social History Main Topics  . Smoking status: Former Games developer  . Smokeless tobacco: Never Used     Comment: Quit 30 years ago as of 01/25/2013  . Alcohol Use: No  . Drug Use: No  . Sexual Activity: Not Currently   Other Topics Concern  . Not on file   Social History Narrative   6th grade. Married Jan 26, 1951 1 son, 1 dtr - died at  5 months. Work - Management consultant, Engineer, drilling, Catering manager.   Retired-fulltime. "[employer]". ACP - yes DNR, yes short-term mechanical ventilarion, no heroic or futile measures in face of irreversible.                Current Outpatient Prescriptions on File Prior to Visit  Medication Sig Dispense Refill  . acetaminophen (TYLENOL) 500 MG tablet Take 1,000 mg by mouth every 6 (six) hours as needed for pain.      [name] Kitchen aspirin 81 MG tablet Take 81 mg by mouth at bedtime.       [name] Kitchen aspirin EC 325 MG tablet Take 325 mg by mouth daily as needed (chest pain).      . Bevacizumab (AVASTIN IV) Place into the right eye See admin instructions. Every 7 weeks      . cetirizine (ZYRTEC) 10 MG tablet Take 10 mg by mouth daily as needed for allergies.       . fluticasone (FLONASE) 50 MCG/ACT nasal spray Place 2 sprays into the nose daily as needed for allergies.       . furosemide (LASIX) 40 MG tablet Take 1 tablet (40 mg total) by mouth daily.  30 tablet  5  . gabapentin (NEURONTIN) 300 MG capsule Take 1 capsule (300 mg total) by mouth 3 (three) times daily.  90 capsule  5  . gemfibrozil (LOPID) 600 MG tablet Take 1 tablet (600 mg total) by mouth 2 (two) times daily.  60 tablet  5  . Hypromellose (ARTIFICIAL TEARS OP) Apply 1 drop to eye daily as needed (itchy / watery eyes).      . metoprolol succinate (TOPROL-XL) 25 MG 24 hr tablet Take 2 tablets (50 mg total) by mouth daily. take at hs  60 tablet  1  . Multiple Vitamins-Minerals (OCUVITE ADULT 50+ PO) Take 1 capsule by mouth daily.        . pantoprazole (PROTONIX) 40 MG tablet Take 1 tablet (40 mg total) by mouth daily. 1 tab every morning  30 tablet  11  . potassium chloride SA (K-DUR,KLOR-CON) 20 MEQ tablet Take 1 tablet (20 mEq total) by mouth daily.  30 tablet  5  . [DISCONTINUED] lisinopril (PRINIVIL,ZESTRIL) 10 MG tablet Take 1 tablet (10 mg total) by mouth daily.  30 tablet  0   No current facility-administered medications on file prior to visit.      Review of Systems Constitutional:  Negative for fever, chills, activity change and unexpected weight change.  HEENT:  Negative for hearing loss, ear pain, congestion, neck stiffness and postnasal drip. Negative for sore throat or swallowing problems. Negative for dental complaints.   Eyes: Negative for vision loss or change in visual acuity.  Respiratory: Negative for chest tightness and wheezing. Negative for DOE.   Cardiovascular: Negative for chest pain or palpitations. No decreased exercise  tolerance Gastrointestinal: No change in bowel habit. No bloating or gas. No reflux or indigestion Genitourinary: Negative for urgency, frequency, flank pain and difficulty urinating.  Musculoskeletal: Negative for myalgias, back pain, arthralgias and gait problem.  Neurological: Negative for dizziness, tremors, weakness and headaches.  Hematological: Negative for adenopathy.  Psychiatric/Behavioral: Negative for behavioral problems and dysphoric mood.       Objective:   Physical Exam Filed Vitals:   08/12/13 0905  BP: 160/88  Pulse: 63  Temp: 96.8 F (36 C)   Wt Readings from Last 3 Encounters:  08/12/13 162 lb (73.483 kg)  02/19/13 167 lb 3.2 oz (75.841 kg)  01/22/13 164 lb (74.39 kg)   Gen'l: Well nourished well developed male in no acute distress  HEENT: Head: Normocephalic and atraumatic. Right Ear: External ear normal. EAC/TM nl. Left Ear: External ear normal.  EAC/TM nl. Nose: Nose normal. Mouth/Throat: Oropharynx is clear and moist. Dentition - native, in good repair. No buccal or palatal lesions. Posterior pharynx clear. Eyes: Conjunctivae and sclera clear. EOM intact. Pupils are equal, round, and reactive to light. Right eye exhibits no discharge. Left eye exhibits no discharge. Neck: Normal range of motion. Neck supple. No JVD present. No tracheal deviation present. No thyromegaly present.  Cardiovascular: Normal rate, regular rhythm, no gallop, no friction rub, no murmur  heard.      Quiet precordium. 2+ radial and DP pulses . No carotid bruits Pulmonary/Chest: Effort normal. No respiratory distress or increased WOB, no wheezes, no rales. No chest wall deformity or CVAT. Abdomen: Soft. Bowel sounds are normal in all quadrants. He exhibits no distension, no tenderness, no rebound or guarding, No heptosplenomegaly  Genitourinary:   Musculoskeletal: Normal range of motion. He exhibits no edema and no tenderness.       Small and large joints without redness, synovial thickening or deformity. Full range of motion preserved about all small, median and large joints.  Lymphadenopathy:    He has no cervical or supraclavicular adenopathy.  Neurological: He is alert and oriented to person, place, and time. CN II-XII intact. DTRs 2+ and symmetrical biceps, radial and patellar tendons. Cerebellar function normal with no tremor, rigidity, normal gait and station.  Skin: Skin is warm and dry. No rash noted. No erythema.  Psychiatric: He has a normal mood and affect. His behavior is normal. Thought content normal.         Assessment & Plan:

## 2013-08-12 NOTE — Patient Instructions (Signed)
Good to see you. Happy Holidays  Your exam is ok.  For blood pressure will stop Amlodipine and start diltiazem ER 240 mg once a day. Continue your other medications  Routine labs today with results to be mailed to you.  Immunizations: will give prevnar pneumonia vaccine, once and done.  In general you are in pretty good condition for the condition you are in.

## 2013-08-12 NOTE — Assessment & Plan Note (Signed)
No report of hematochezia.  Plan Lab: H/H

## 2013-08-12 NOTE — Assessment & Plan Note (Signed)
Mr. Poli c/o occasional nocturnal heartburn.  Plan Continue AM protonix  Add Ranitidine 150 mg qPM prn.

## 2013-08-12 NOTE — Assessment & Plan Note (Signed)
Interval history is benign. Physical exam is normal. Labs ordered and pending. He is current with colorectal cancer screening and has aged out of PSA screening. Immunizations are brought up to date.  In summary An Irascible gentleman but a good soul who is medically stable and doing well. He will transfer care to [facility].

## 2013-08-19 ENCOUNTER — Encounter: Payer: Self-pay | Admitting: Internal Medicine

## 2013-10-04 ENCOUNTER — Encounter: Payer: Self-pay | Admitting: Family Medicine

## 2013-10-04 ENCOUNTER — Ambulatory Visit (INDEPENDENT_AMBULATORY_CARE_PROVIDER_SITE_OTHER): Payer: Medicare HMO | Admitting: Family Medicine

## 2013-10-04 VITALS — BP 144/66 | HR 62 | Temp 97.4°F | Ht 63.0 in | Wt 176.5 lb

## 2013-10-04 DIAGNOSIS — K2270 Barrett's esophagus without dysplasia: Secondary | ICD-10-CM

## 2013-10-04 DIAGNOSIS — E785 Hyperlipidemia, unspecified: Secondary | ICD-10-CM

## 2013-10-04 DIAGNOSIS — I10 Essential (primary) hypertension: Secondary | ICD-10-CM

## 2013-10-04 NOTE — Progress Notes (Signed)
Pre-visit discussion using our clinic review tool. No additional management support is needed unless otherwise documented below in the visit note.  Hypertension:    Using medication without problems or lightheadedness: yes Chest pain with exertion:no Edema:no Short of breath:no Average home BPs: 130-150s/60-70s  Elevated Cholesterol: Using medications without problems:yes Muscle aches: no Diet compliance:yes Exercise:encouraged  H/o Barrett's, on PPI.  D/w pt.   Back pain much improved with gabapentin. No ADE on med.   PMH and SH reviewed  ROS: See HPI, otherwise noncontributory.  Meds, vitals, and allergies reviewed.   GEN: nad, alert and oriented HEENT: mucous membranes moist NECK: supple w/o LA CV: rrr. PULM: ctab, no inc wob ABD: soft, +bs EXT: no edema SKIN: no acute rash

## 2013-10-04 NOTE — Patient Instructions (Signed)
Don't your meds.   If you have questions the call the clinic.  Take care.  Glad to see you.   Recheck in about 1 year at a physical.  Good luck with your garden.

## 2013-10-06 ENCOUNTER — Encounter: Payer: Self-pay | Admitting: Family Medicine

## 2013-10-06 NOTE — Assessment & Plan Note (Signed)
Continue PPI ?

## 2013-10-06 NOTE — Assessment & Plan Note (Signed)
Would not treat more aggressively for SBP given the current DBP. D/w pt. He agrees. Continue as is. Prev labs reviewed.

## 2013-10-06 NOTE — Assessment & Plan Note (Signed)
Would continue as is with meds.  Statin intolerant.  D/w pt. He agrees. Prev labs reviewed. Encouraged healthy diet and exercise.

## 2013-11-14 ENCOUNTER — Other Ambulatory Visit: Payer: Self-pay | Admitting: Internal Medicine

## 2013-11-18 ENCOUNTER — Other Ambulatory Visit: Payer: Self-pay | Admitting: Internal Medicine

## 2013-11-25 ENCOUNTER — Other Ambulatory Visit: Payer: Self-pay | Admitting: *Deleted

## 2013-11-25 MED ORDER — GEMFIBROZIL 600 MG PO TABS: 600.0000 mg | ORAL_TABLET | Freq: Two times a day (BID) | ORAL | Status: DC

## 2013-12-12 ENCOUNTER — Encounter (HOSPITAL_COMMUNITY): Payer: Self-pay | Admitting: Emergency Medicine

## 2013-12-12 ENCOUNTER — Emergency Department (HOSPITAL_COMMUNITY): Payer: Medicare HMO

## 2013-12-12 ENCOUNTER — Emergency Department (HOSPITAL_COMMUNITY)
Admission: EM | Admit: 2013-12-12 | Discharge: 2013-12-12 | Disposition: A | Discharge status: Home / Self Care | Payer: Medicare HMO | Attending: Emergency Medicine | Admitting: Emergency Medicine

## 2013-12-12 DIAGNOSIS — K219 Gastro-esophageal reflux disease without esophagitis: Secondary | ICD-10-CM | POA: Insufficient documentation

## 2013-12-12 DIAGNOSIS — Y92009 Unspecified place in unspecified non-institutional (private) residence as the place of occurrence of the external cause: Secondary | ICD-10-CM | POA: Insufficient documentation

## 2013-12-12 DIAGNOSIS — S4980XA Other specified injuries of shoulder and upper arm, unspecified arm, initial encounter: Secondary | ICD-10-CM | POA: Insufficient documentation

## 2013-12-12 DIAGNOSIS — IMO0002 Reserved for concepts with insufficient information to code with codable children: Secondary | ICD-10-CM | POA: Insufficient documentation

## 2013-12-12 DIAGNOSIS — S6990XA Unspecified injury of unspecified wrist, hand and finger(s), initial encounter: Secondary | ICD-10-CM

## 2013-12-12 DIAGNOSIS — Z87891 Personal history of nicotine dependence: Secondary | ICD-10-CM | POA: Insufficient documentation

## 2013-12-12 DIAGNOSIS — S59909A Unspecified injury of unspecified elbow, initial encounter: Secondary | ICD-10-CM | POA: Insufficient documentation

## 2013-12-12 DIAGNOSIS — H9190 Unspecified hearing loss, unspecified ear: Secondary | ICD-10-CM | POA: Insufficient documentation

## 2013-12-12 DIAGNOSIS — Z79899 Other long term (current) drug therapy: Secondary | ICD-10-CM | POA: Insufficient documentation

## 2013-12-12 DIAGNOSIS — Z862 Personal history of diseases of the blood and blood-forming organs and certain disorders involving the immune mechanism: Secondary | ICD-10-CM | POA: Insufficient documentation

## 2013-12-12 DIAGNOSIS — I10 Essential (primary) hypertension: Secondary | ICD-10-CM | POA: Insufficient documentation

## 2013-12-12 DIAGNOSIS — S59919A Unspecified injury of unspecified forearm, initial encounter: Secondary | ICD-10-CM

## 2013-12-12 DIAGNOSIS — Z7982 Long term (current) use of aspirin: Secondary | ICD-10-CM | POA: Insufficient documentation

## 2013-12-12 DIAGNOSIS — Y939 Activity, unspecified: Secondary | ICD-10-CM | POA: Insufficient documentation

## 2013-12-12 DIAGNOSIS — Z87448 Personal history of other diseases of urinary system: Secondary | ICD-10-CM | POA: Insufficient documentation

## 2013-12-12 DIAGNOSIS — K2270 Barrett's esophagus without dysplasia: Secondary | ICD-10-CM | POA: Insufficient documentation

## 2013-12-12 DIAGNOSIS — E785 Hyperlipidemia, unspecified: Secondary | ICD-10-CM | POA: Insufficient documentation

## 2013-12-12 DIAGNOSIS — S46909A Unspecified injury of unspecified muscle, fascia and tendon at shoulder and upper arm level, unspecified arm, initial encounter: Secondary | ICD-10-CM | POA: Insufficient documentation

## 2013-12-12 DIAGNOSIS — S4990XA Unspecified injury of shoulder and upper arm, unspecified arm, initial encounter: Secondary | ICD-10-CM

## 2013-12-12 DIAGNOSIS — W1809XA Striking against other object with subsequent fall, initial encounter: Secondary | ICD-10-CM | POA: Insufficient documentation

## 2013-12-12 LAB — CBC WITH DIFFERENTIAL/PLATELET
Basophils Absolute: 0 10*3/uL (ref 0.0–0.1)
Basophils Relative: 0 % (ref 0–1)
Eosinophils Absolute: 0.1 10*3/uL (ref 0.0–0.7)
Eosinophils Relative: 1 % (ref 0–5)
HCT: 42.5 % (ref 39.0–52.0)
Hemoglobin: 13.8 g/dL (ref 13.0–17.0)
Lymphocytes Relative: 11 % — ABNORMAL LOW (ref 12–46)
Lymphs Abs: 1.1 10*3/uL (ref 0.7–4.0)
MCH: 28.8 pg (ref 26.0–34.0)
MCHC: 32.5 g/dL (ref 30.0–36.0)
MCV: 88.5 fL (ref 78.0–100.0)
Monocytes Absolute: 0.9 10*3/uL (ref 0.1–1.0)
Monocytes Relative: 9 % (ref 3–12)
Neutro Abs: 7.8 10*3/uL — ABNORMAL HIGH (ref 1.7–7.7)
Neutrophils Relative %: 79 % — ABNORMAL HIGH (ref 43–77)
Platelets: 241 10*3/uL (ref 150–400)
RBC: 4.8 MIL/uL (ref 4.22–5.81)
RDW: 13.2 % (ref 11.5–15.5)
WBC: 9.8 10*3/uL (ref 4.0–10.5)

## 2013-12-12 LAB — COMPREHENSIVE METABOLIC PANEL
ALT: 12 U/L (ref 0–53)
AST: 23 U/L (ref 0–37)
Albumin: 3.8 g/dL (ref 3.5–5.2)
Alkaline Phosphatase: 101 U/L (ref 39–117)
BUN: 22 mg/dL (ref 6–23)
CO2: 22 mEq/L (ref 19–32)
Calcium: 9.1 mg/dL (ref 8.4–10.5)
Chloride: 103 mEq/L (ref 96–112)
Creatinine, Ser: 1.38 mg/dL — ABNORMAL HIGH (ref 0.50–1.35)
GFR calc Af Amer: 52 mL/min — ABNORMAL LOW (ref >90)
GFR calc non Af Amer: 45 mL/min — ABNORMAL LOW (ref >90)
Glucose, Bld: 106 mg/dL — ABNORMAL HIGH (ref 70–99)
Potassium: 4.3 mEq/L (ref 3.7–5.3)
Sodium: 139 mEq/L (ref 137–147)
Total Bilirubin: 0.3 mg/dL (ref 0.3–1.2)
Total Protein: 7.1 g/dL (ref 6.0–8.3)

## 2013-12-12 LAB — I-STAT TROPONIN, ED: Troponin i, poc: 0.01 ng/mL (ref 0.00–0.08)

## 2013-12-12 MED ORDER — SODIUM CHLORIDE 0.9 % IV BOLUS (SEPSIS)
500.0000 mL | Freq: Once | INTRAVENOUS | Status: AC
Start: 2013-12-12 — End: 2013-12-12
  Administered 2013-12-12: 500 mL via INTRAVENOUS

## 2013-12-12 MED ORDER — OXYCODONE-ACETAMINOPHEN 5-325 MG PO TABS: 2.0000 | ORAL_TABLET | Freq: Once | ORAL | Status: DC

## 2013-12-12 NOTE — ED Provider Notes (Signed)
CSN: [id number]     Arrival date & time 12/12/13  1630 History   First MD Initiated Contact with Patient 12/12/13 1639     Chief Complaint  Patient presents with  . Fall     (Consider location/radiation/quality/duration/timing/severity/associated sxs/prior Treatment) The history is provided by the patient.  [name] is a 80 y.o. male hx of diverticulosis, HL, HTN, PUD here with fall. He states that he tripped over his lawnmower and hit his head as well as his right shoulder. Denies any syncope but EMS noted that when they stood him up he felt lightheaded and dizzy. Denies any chest pain or shortness of breath. He states that he has right scapula pain and right forearm pain afterwards. Not on blood thinners.    Past Medical History  Diagnosis Date  . Barrett esophagus 09/2003  . GERD (gastroesophageal reflux disease)   . Hemorrhoids   . Diverticulosis   . Hyperlipidemia   . Hypertension   . Von Willebrand disease   . PUD (peptic ulcer disease)   . Hearing loss   . GI AVM (gastrointestinal arteriovenous vascular malformation)   . History of prostatitis   . Hemorrhoids   . Macular degeneration, wet     receiving intra-ocular injections (McKuen)  . Back pain     improved with gabapentin as of 2015   Past Surgical History  Procedure Laterality Date  . Inguinal hernia repair  2001   Family History  Problem Relation Age of Onset  . Heart attack Father   . Coronary artery disease Father   . Hypertension Mother   . Pancreatic cancer Mother     Died at 2486.  . Cancer Mother     Pancreatic cancer  . Arthritis Sister   . Hyperlipidemia Sister   . Prostate cancer Brother   . Cancer Brother     Bone Cancer secondary to Prostate Cancer  . Diabetes Neg Hx    History  Substance Use Topics  . Smoking status: Former Games developermoker  . Smokeless tobacco: Never Used     Comment: Quit 30 years ago as of 2014  . Alcohol Use: No    Review of Systems  Musculoskeletal:       R scapula  pain   All other systems reviewed and are negative.     Allergies  Atorvastatin; Lisinopril; and Statins  Home Medications   Prior to Admission medications   Medication Sig Start Date End Date Taking? Authorizing Provider  acetaminophen (TYLENOL) 500 MG tablet Take 1,000 mg by mouth every 6 (six) hours as needed for pain.   Yes Historical Provider, MD  aspirin 81 MG tablet Take 81 mg by mouth at bedtime.    Yes Historical Provider, MD  Bevacizumab (AVASTIN IV) Place into the right eye See admin instructions. Every 7 weeks   Yes Historical Provider, MD  cetirizine (ZYRTEC) 10 MG tablet Take 10 mg by mouth daily as needed for allergies.    Yes Historical Provider, MD  diltiazem (DILACOR XR) 240 MG 24 hr capsule Take 1 capsule (240 mg total) by mouth daily. 08/12/13  Yes Jacques NavyMichael E Norins, MD  fluticasone (FLONASE) 50 MCG/ACT nasal spray Place 2 sprays into the nose daily as needed for allergies.    Yes Historical Provider, MD  furosemide (LASIX) 40 MG tablet Take 40 mg by mouth daily.   Yes Historical Provider, MD  gabapentin (NEURONTIN) 300 MG capsule Take 300 mg by mouth 2 (two) times daily. 05/29/13  Yes  Jacques NavyMichael E Norins, MD  gemfibrozil (LOPID) 600 MG tablet Take 1 tablet (600 mg total) by mouth 2 (two) times daily before a meal. 11/25/13 11/25/14 Yes Joaquim NamGraham S Duncan, MD  Hypromellose (ARTIFICIAL TEARS OP) Apply 1 drop to eye daily as needed (itchy / watery eyes).   Yes Historical Provider, MD  metoprolol succinate (TOPROL-XL) 25 MG 24 hr tablet Take 25 mg by mouth at bedtime.  02/27/13  Yes Jacques NavyMichael E Norins, MD  Multiple Vitamins-Minerals (OCUVITE ADULT 50+ PO) Take 1 capsule by mouth daily.     Yes Historical Provider, MD  pantoprazole (PROTONIX) 40 MG tablet Take 1 tablet (40 mg total) by mouth daily. 1 tab every morning 03/12/13 03/12/14 Yes Jacques NavyMichael E Norins, MD  potassium chloride SA (K-DUR,KLOR-CON) 20 MEQ tablet Take 1 tablet (20 mEq total) by mouth daily. 05/30/13  Yes Jacques NavyMichael E Norins, MD   ranitidine (ZANTAC) 150 MG tablet Take 1 tablet (150 mg total) by mouth 2 (two) times daily. 08/12/13  Yes Jacques NavyMichael E Norins, MD   BP 149/82  Pulse 59  Temp(Src) 97.2 F (36.2 C) (Oral)  Resp 15  SpO2 95% Physical Exam  Nursing note and vitals reviewed. Constitutional: He is oriented to person, place, and time. He appears well-nourished.  Well appearing for age   HENT:  Head: Normocephalic and atraumatic.  Mouth/Throat: Oropharynx is clear and moist.  Eyes: Conjunctivae are normal. Pupils are equal, round, and reactive to light.  Neck: Normal range of motion. Neck supple.  Cardiovascular: Normal rate, regular rhythm and normal heart sounds.   Pulmonary/Chest: Effort normal and breath sounds normal. No respiratory distress. He has no wheezes. He has no rales.  R scapula more protruded, tender to touch.   Abdominal: Soft. Bowel sounds are normal. He exhibits no distension. There is no tenderness. There is no rebound.  Musculoskeletal:  R forearm with small hematoma, mild tenderness. 2+ pulses. Pelvis stable. No other extremity trauma   Neurological: He is alert and oriented to person, place, and time. No cranial nerve deficit. Coordination normal.  Nl strength and sensation throughout   Skin: Skin is warm and dry.  Psychiatric: He has a normal mood and affect. His behavior is normal. Judgment and thought content normal.    ED Course  Procedures (including critical care time) Labs Review Labs Reviewed  CBC WITH DIFFERENTIAL - Abnormal; Notable for the following:    Neutrophils Relative % 79 (*)    Neutro Abs 7.8 (*)    Lymphocytes Relative 11 (*)    All other components within normal limits  COMPREHENSIVE METABOLIC PANEL - Abnormal; Notable for the following:    Glucose, Bld 106 (*)    Creatinine, Ser 1.38 (*)    GFR calc non Af Amer 45 (*)    GFR calc Af Amer 52 (*)    All other components within normal limits  I-STAT TROPOININ, ED    Imaging Review Dg Chest 2  View  12/12/2013   CLINICAL DATA:  Chest and right scapular pain status post fall.  EXAM: CHEST  2 VIEW  COMPARISON:  12/25/2012.  FINDINGS: The heart size and mediastinal contours are stable. There is a small hiatal hernia. The lungs appear stable with mild central airway thickening. There is no pleural effusion or pneumothorax. No acute osseous findings are observed.  IMPRESSION: Stable chest.  No acute cardiopulmonary process demonstrated.   Electronically Signed   By: Roxy HorsemanBill  Veazey M.D.   On: 12/12/2013 19:44   Dg Scapula Right  12/12/2013   CLINICAL DATA:  Right shoulder and scapular pain status post fall.  EXAM: RIGHT SCAPULA - 2+ VIEWS  COMPARISON:  DG CHEST 2 VIEW dated 12/25/2012; DG CHEST 2 VIEW dated 02/24/2009  FINDINGS: The mineralization and alignment are normal. There is no evidence of acute fracture or dislocation. Mild acromioclavicular degenerative changes are noted. No focal soft tissue swelling or foreign body identified.  IMPRESSION: No acute osseous findings.   Electronically Signed   By: Roxy Horseman M.D.   On: 12/12/2013 19:48   Dg Forearm Right  12/12/2013   CLINICAL DATA:  Forearm pain and swelling status post fall.  EXAM: RIGHT FOREARM - 2 VIEW  COMPARISON:  None.  FINDINGS: There is no evidence of acute fracture or dislocation. Moderate degenerative changes are present throughout the elbow. There is no evidence of elbow joint effusion. IV catheter is present within the antecubital fossa. No focal soft tissue swelling identified.  IMPRESSION: No acute osseous findings. Degenerative changes throughout the elbow.   Electronically Signed   By: Roxy Horseman M.D.   On: 12/12/2013 19:47     EKG Interpretation   Date/Time:  Thursday December 12 2013 17:05:58 EDT Ventricular Rate:  60 PR Interval:  182 QRS Duration: 90 QT Interval:  418 QTC Calculation: 418 R Axis:   63 Text Interpretation:  Sinus rhythm No significant change since last  tracing Confirmed by Toshiro Hanken  MD, Arieon Corcoran ([id number]) on  12/12/2013 5:11:44 PM      MDM   Final diagnoses:  None   Martin Ivan is a 80 y.o. male here with fall. EKG unremarkable. Denies syncope, not orthostatic. Obvious R scapula and R forearm injury, will get xrays. Did hit his head but no hematoma and not vomiting and not on anticoagulation. Well appearing, nl neuro exam, so will hold off CT.   8:30 PM Mild dehydration on labs, given IVF. xrays showed no fracture. Doesn't want pain meds, just wants tylenol.    Richardean Canal, MD 12/12/13 2031

## 2013-12-12 NOTE — ED Notes (Signed)
Called X-ray to check on delay, X-ray said the patient is 3rd on the list...this tech updated the patient.

## 2013-12-12 NOTE — ED Notes (Signed)
Per EMS pt fell at home in the yard and landed on his lawn mower on his right chest/right shoulder, pt denies LOC at time of fall. Upon EMS arrival, EMS helped pt to a standing position and pt had a witnessed LOC, EMS believes the pt had a vagal response. EMS reports pt denied pain medication adm. EMS reports right scapula deformity.

## 2013-12-12 NOTE — ED Notes (Signed)
Pt. Took wife's 500mg  tylenol.

## 2013-12-12 NOTE — Discharge Instructions (Signed)
Take tylenol 500mg  every 6 hrs for pain.   Follow up with your doctor.  Return to ER if you have severe pain, vomiting, chest pain.

## 2014-02-04 ENCOUNTER — Other Ambulatory Visit: Payer: Self-pay | Admitting: *Deleted

## 2014-02-04 MED ORDER — METOPROLOL SUCCINATE ER 25 MG PO TB24: 25.0000 mg | ORAL_TABLET | Freq: Every day | ORAL | Status: DC

## 2014-02-28 ENCOUNTER — Other Ambulatory Visit: Payer: Self-pay | Admitting: *Deleted

## 2014-02-28 DIAGNOSIS — K2270 Barrett's esophagus without dysplasia: Secondary | ICD-10-CM

## 2014-02-28 MED ORDER — PANTOPRAZOLE SODIUM 40 MG PO TBEC: 40.0000 mg | DELAYED_RELEASE_TABLET | Freq: Every day | ORAL | Status: DC

## 2014-02-28 NOTE — Telephone Encounter (Signed)
Faxed refill request.  On the request, the instructions are:  Take 1 capsule by mouth three times a day.  The instructions on our meds list says:  Take 1 capsule by mouth two times a day.  Please advise.

## 2014-03-02 NOTE — Telephone Encounter (Signed)
Please verify with patient. I thought he was taking it BID. Thanks.

## 2014-03-03 MED ORDER — GABAPENTIN 300 MG PO CAPS: 300.0000 mg | ORAL_CAPSULE | Freq: Two times a day (BID) | ORAL | Status: DC

## 2014-03-03 NOTE — Telephone Encounter (Signed)
Patient's wife says it was originally prescribed TID but the patient was usually forgetting to take it at lunch time and he seemed to do okay without that third dose so he just takes it BID.

## 2014-03-03 NOTE — Telephone Encounter (Signed)
Noted, thanks.  Sent.  

## 2014-05-13 ENCOUNTER — Other Ambulatory Visit: Payer: Self-pay | Admitting: *Deleted

## 2014-05-13 MED ORDER — FUROSEMIDE 40 MG PO TABS: 40.0000 mg | ORAL_TABLET | Freq: Every day | ORAL | Status: DC

## 2014-05-22 ENCOUNTER — Emergency Department (HOSPITAL_COMMUNITY): Payer: Medicare HMO

## 2014-05-22 ENCOUNTER — Encounter (HOSPITAL_COMMUNITY): Payer: Self-pay | Admitting: Emergency Medicine

## 2014-05-22 ENCOUNTER — Emergency Department (HOSPITAL_COMMUNITY)
Admission: EM | Admit: 2014-05-22 | Discharge: 2014-05-22 | Disposition: A | Discharge status: Home / Self Care | Payer: Medicare HMO | Attending: Emergency Medicine | Admitting: Emergency Medicine

## 2014-05-22 ENCOUNTER — Telehealth: Payer: Self-pay | Admitting: Family Medicine

## 2014-05-22 DIAGNOSIS — Z92241 Personal history of systemic steroid therapy: Secondary | ICD-10-CM | POA: Insufficient documentation

## 2014-05-22 DIAGNOSIS — E785 Hyperlipidemia, unspecified: Secondary | ICD-10-CM | POA: Diagnosis not present

## 2014-05-22 DIAGNOSIS — R531 Weakness: Secondary | ICD-10-CM | POA: Diagnosis present

## 2014-05-22 DIAGNOSIS — H9190 Unspecified hearing loss, unspecified ear: Secondary | ICD-10-CM | POA: Diagnosis not present

## 2014-05-22 DIAGNOSIS — K2270 Barrett's esophagus without dysplasia: Secondary | ICD-10-CM | POA: Insufficient documentation

## 2014-05-22 DIAGNOSIS — Z8739 Personal history of other diseases of the musculoskeletal system and connective tissue: Secondary | ICD-10-CM | POA: Insufficient documentation

## 2014-05-22 DIAGNOSIS — Z8711 Personal history of peptic ulcer disease: Secondary | ICD-10-CM | POA: Insufficient documentation

## 2014-05-22 DIAGNOSIS — N508 Other specified disorders of male genital organs: Secondary | ICD-10-CM | POA: Insufficient documentation

## 2014-05-22 DIAGNOSIS — Z87438 Personal history of other diseases of male genital organs: Secondary | ICD-10-CM | POA: Diagnosis not present

## 2014-05-22 DIAGNOSIS — I10 Essential (primary) hypertension: Secondary | ICD-10-CM | POA: Insufficient documentation

## 2014-05-22 DIAGNOSIS — Z862 Personal history of diseases of the blood and blood-forming organs and certain disorders involving the immune mechanism: Secondary | ICD-10-CM | POA: Diagnosis not present

## 2014-05-22 DIAGNOSIS — N433 Hydrocele, unspecified: Secondary | ICD-10-CM | POA: Diagnosis not present

## 2014-05-22 DIAGNOSIS — N50819 Testicular pain, unspecified: Secondary | ICD-10-CM

## 2014-05-22 DIAGNOSIS — Z87891 Personal history of nicotine dependence: Secondary | ICD-10-CM | POA: Insufficient documentation

## 2014-05-22 DIAGNOSIS — N503 Cyst of epididymis: Secondary | ICD-10-CM | POA: Insufficient documentation

## 2014-05-22 DIAGNOSIS — K219 Gastro-esophageal reflux disease without esophagitis: Secondary | ICD-10-CM | POA: Diagnosis not present

## 2014-05-22 DIAGNOSIS — Z7982 Long term (current) use of aspirin: Secondary | ICD-10-CM | POA: Diagnosis not present

## 2014-05-22 DIAGNOSIS — Z79899 Other long term (current) drug therapy: Secondary | ICD-10-CM | POA: Diagnosis not present

## 2014-05-22 LAB — BASIC METABOLIC PANEL
Anion gap: 16 — ABNORMAL HIGH (ref 5–15)
BUN: 16 mg/dL (ref 6–23)
CO2: 24 mEq/L (ref 19–32)
Calcium: 9.3 mg/dL (ref 8.4–10.5)
Chloride: 99 mEq/L (ref 96–112)
Creatinine, Ser: 1.08 mg/dL (ref 0.50–1.35)
GFR calc Af Amer: 70 mL/min — ABNORMAL LOW (ref >90)
GFR calc non Af Amer: 60 mL/min — ABNORMAL LOW (ref >90)
Glucose, Bld: 94 mg/dL (ref 70–99)
Potassium: 4.1 mEq/L (ref 3.7–5.3)
Sodium: 139 mEq/L (ref 137–147)

## 2014-05-22 LAB — URINALYSIS, ROUTINE W REFLEX MICROSCOPIC
Bilirubin Urine: NEGATIVE
Glucose, UA: NEGATIVE mg/dL
Hgb urine dipstick: NEGATIVE
Ketones, ur: NEGATIVE mg/dL
Leukocytes, UA: NEGATIVE
Nitrite: NEGATIVE
Protein, ur: NEGATIVE mg/dL
Specific Gravity, Urine: 1.012 (ref 1.005–1.030)
Urobilinogen, UA: 0.2 mg/dL (ref 0.0–1.0)
pH: 6 (ref 5.0–8.0)

## 2014-05-22 LAB — CBC WITH DIFFERENTIAL/PLATELET
Basophils Absolute: 0 10*3/uL (ref 0.0–0.1)
Basophils Relative: 0 % (ref 0–1)
Eosinophils Absolute: 0.1 10*3/uL (ref 0.0–0.7)
Eosinophils Relative: 1 % (ref 0–5)
HCT: 45 % (ref 39.0–52.0)
Hemoglobin: 15.1 g/dL (ref 13.0–17.0)
Lymphocytes Relative: 8 % — ABNORMAL LOW (ref 12–46)
Lymphs Abs: 1.1 10*3/uL (ref 0.7–4.0)
MCH: 28.8 pg (ref 26.0–34.0)
MCHC: 33.6 g/dL (ref 30.0–36.0)
MCV: 85.7 fL (ref 78.0–100.0)
Monocytes Absolute: 1.3 10*3/uL — ABNORMAL HIGH (ref 0.1–1.0)
Monocytes Relative: 10 % (ref 3–12)
Neutro Abs: 10.1 10*3/uL — ABNORMAL HIGH (ref 1.7–7.7)
Neutrophils Relative %: 81 % — ABNORMAL HIGH (ref 43–77)
Platelets: 247 10*3/uL (ref 150–400)
RBC: 5.25 MIL/uL (ref 4.22–5.81)
RDW: 13.3 % (ref 11.5–15.5)
WBC: 12.6 10*3/uL — ABNORMAL HIGH (ref 4.0–10.5)

## 2014-05-22 MED ORDER — HYDROCODONE-ACETAMINOPHEN 5-325 MG PO TABS: 1.0000 | ORAL_TABLET | Freq: Once | ORAL | Status: DC

## 2014-05-22 MED ORDER — HYDROCODONE-ACETAMINOPHEN 5-325 MG PO TABS: 1.0000 | ORAL_TABLET | ORAL | Status: DC | PRN

## 2014-05-22 NOTE — ED Notes (Signed)
Pt states he is experiencing pain in his testicles/scrotal area. Denies difficulty urinating and denies painful urination, does report frequent urination.

## 2014-05-22 NOTE — Telephone Encounter (Signed)
Patient Information:  Caller Name: [name]  Phone: [phone]  Patient: Ander PurpuraJones, Roniel  Gender: Male  DOB: Jan 14, 1926  Age: 80 Years  PCP: Crawford Givensuncan, Graham Clelia Croft(Shaw) [facility](Family Practice)  Office Follow Up:  Does the office need to follow up with this patient?: Yes  Instructions For The Office: Patient does not want to go to another location. Declines ED. Requesting appointment.  Please review and contact.  RN Note:  Patient does not want to go to another location. Declines ED. Requesting appointment.  Please review and contact.  Symptoms  Reason For Call & Symptoms: Patient is having pain in lower abdomen and testicles. Onset last night 05/21/14. Constant. No pain with urination and no blood noted. Last UOP- recent per wife.  No back pain. Afebrile.  Reviewed Health History In EMR: Yes  Reviewed Medications In EMR: Yes  Reviewed Allergies In EMR: Yes  Reviewed Surgeries / Procedures: Yes  Date of Onset of Symptoms: 05/21/2014  Guideline(s) Used:  Abdominal Pain - Male  Disposition Per Guideline:   Go to ED Now  Reason For Disposition Reached:   Pain in scrotum persists > 1 hour  Advice Given:  Rest:  Lie down and rest until you feel better.  Fluids:  Sip clear fluids only (e.g., water, flat soft drinks or half-strength fruit juice) until the pain has been gone for over 2 hours. Then slowly return to a regular diet.  Avoid NSAIDS and Aspirin  : Avoid any drug that can irritate the stomach lining and make the pain worse (especially aspirin and NSAIDs like ibuprofen).  Call Back If:  You become worse.  Diet:  Slowly advance diet from clear liquids to a bland diet  Avoid alcohol or caffeinated beverages  Avoid greasy or fatty foods  RN Overrode Recommendation:  Make Appointment  Patient does not want to go to another location. Declines ED. Requesting appointment.  Please review and contact.

## 2014-05-22 NOTE — ED Notes (Addendum)
Groin pain (scrotum).  Rt. Lower abd. Hurting. No painful urination.

## 2014-05-22 NOTE — Telephone Encounter (Signed)
Agree with ER/eval. Staff notified patient.

## 2014-05-22 NOTE — ED Provider Notes (Signed)
TIME SEEN: 6:48 PM  CHIEF COMPLAINT: Testicular pain  HPI: Patient is a 80 y.o. M with history of hypertension, hyperlipidemia who presents the emergency department with 2 days of bilateral testicular pain and suprapubic pain. No radiation of pain. No aggravating or relieving factors. Describes pain is mild. Took Tylenol prior to arrival. He has not noticed any swelling or discoloration. He has not had any fevers, chills, nausea, vomiting or diarrhea. No penile discharge. No dysuria or hematuria. No injury or trauma to this area. No associated redness or warmth. He is married but not sexually active. Has had prior right internal hernia repair.  ROS: See HPI Constitutional: no fever  Eyes: no drainage  ENT: no runny nose   Cardiovascular:  no chest pain  Resp: no SOB  GI: no vomiting GU: no dysuria Integumentary: no rash  Allergy: no hives  Musculoskeletal: no leg swelling  Neurological: no slurred speech ROS otherwise negative  PAST MEDICAL HISTORY/PAST SURGICAL HISTORY:  Past Medical History  Diagnosis Date  . Barrett esophagus 09/2003  . GERD (gastroesophageal reflux disease)   . Hemorrhoids   . Diverticulosis   . Hyperlipidemia   . Hypertension   . Von Willebrand disease   . PUD (peptic ulcer disease)   . Hearing loss   . GI AVM (gastrointestinal arteriovenous vascular malformation)   . History of prostatitis   . Hemorrhoids   . Macular degeneration, wet     receiving intra-ocular injections (McKuen)  . Back pain     improved with gabapentin as of 2015    MEDICATIONS:  Prior to Admission medications   Medication Sig Start Date End Date Taking? Authorizing Provider  acetaminophen (TYLENOL) 500 MG tablet Take 1,000 mg by mouth every 6 (six) hours as needed for pain.    Historical Provider, MD  aspirin 81 MG tablet Take 81 mg by mouth at bedtime.     Historical Provider, MD  Bevacizumab (AVASTIN IV) Place into the right eye See admin instructions. Every 7 weeks     Historical Provider, MD  cetirizine (ZYRTEC) 10 MG tablet Take 10 mg by mouth daily as needed for allergies.     Historical Provider, MD  diltiazem (DILACOR XR) 240 MG 24 hr capsule Take 1 capsule (240 mg total) by mouth daily. 08/12/13   Jacques NavyMichael E Norins, MD  fluticasone (FLONASE) 50 MCG/ACT nasal spray Place 2 sprays into the nose daily as needed for allergies.     Historical Provider, MD  furosemide (LASIX) 40 MG tablet Take 1 tablet (40 mg total) by mouth daily. 05/13/14   Joaquim NamGraham S Duncan, MD  gabapentin (NEURONTIN) 300 MG capsule Take 1 capsule (300 mg total) by mouth 2 (two) times daily.    Joaquim NamGraham S Duncan, MD  gemfibrozil (LOPID) 600 MG tablet Take 1 tablet (600 mg total) by mouth 2 (two) times daily before a meal. 11/25/13 11/25/14  Joaquim NamGraham S Duncan, MD  Hypromellose (ARTIFICIAL TEARS OP) Apply 1 drop to eye daily as needed (itchy / watery eyes).    Historical Provider, MD  metoprolol succinate (TOPROL-XL) 25 MG 24 hr tablet Take 1 tablet (25 mg total) by mouth at bedtime. 02/04/14   Joaquim NamGraham S Duncan, MD  Multiple Vitamins-Minerals (OCUVITE ADULT 50+ PO) Take 1 capsule by mouth daily.      Historical Provider, MD  pantoprazole (PROTONIX) 40 MG tablet Take 1 tablet (40 mg total) by mouth daily. 1 tab every morning 02/28/14 02/28/15  Joaquim NamGraham S Duncan, MD  potassium chloride SA (  K-DUR,KLOR-CON) 20 MEQ tablet Take 1 tablet (20 mEq total) by mouth daily. 05/30/13   Jacques Navy, MD  ranitidine (ZANTAC) 150 MG tablet Take 1 tablet (150 mg total) by mouth 2 (two) times daily. 08/12/13   Jacques Navy, MD    ALLERGIES:  Allergies  Allergen Reactions  . Atorvastatin     REACTION: muscles tense up   . Lisinopril Swelling    Lips and face swelling  . Statins Other (See Comments)    Muscle tense up    SOCIAL HISTORY:  History  Substance Use Topics  . Smoking status: Former Games developer  . Smokeless tobacco: Never Used     Comment: Quit 30 years ago as of 2014  . Alcohol Use: No    FAMILY  HISTORY: Family History  Problem Relation Age of Onset  . Heart attack Father   . Coronary artery disease Father   . Hypertension Mother   . Pancreatic cancer Mother     Died at 58.  . Cancer Mother     Pancreatic cancer  . Arthritis Sister   . Hyperlipidemia Sister   . Prostate cancer Brother   . Cancer Brother     Bone Cancer secondary to Prostate Cancer  . Diabetes Neg Hx     EXAM: BP 154/70  Pulse 63  Temp(Src) 98.7 F (37.1 C) (Oral)  Resp 18  SpO2 96% CONSTITUTIONAL: Alert and oriented and responds appropriately to questions. Well-appearing; well-nourished smiling, pleasant, elderly, joking, nontoxic HEAD: Normocephalic EYES: Conjunctivae clear, PERRL ENT: normal nose; no rhinorrhea; moist mucous membranes; pharynx without lesions noted NECK: Supple, no meningismus, no LAD  CARD: RRR; S1 and S2 appreciated; no murmurs, no clicks, no rubs, no gallops RESP: Normal chest excursion without splinting or tachypnea; breath sounds clear and equal bilaterally; no wheezes, no rhonchi, no rales,  ABD/GI: Normal bowel sounds; non-distended; soft, non-tender, no rebound, no guarding, no peritoneal signs, no hernia appreciated GU:  Normal external genitalia, patient is circumcised, no lesions noted on the shaft of the penis, normal urethral meatus without discharge or blood, patient has bilateral testicular pain without masses, no high riding testicle, no scrotal masses, no hernia appreciated, 2+ femoral pulses bilaterally, no perineal warmth or erythema or induration or crepitus BACK:  The back appears normal and is non-tender to palpation, there is no CVA tenderness EXT: Normal ROM in all joints; non-tender to palpation; no edema; normal capillary refill; no cyanosis    SKIN: Normal color for age and race; warm NEURO: Moves all extremities equally PSYCH: The patient's mood and manner are appropriate. Grooming and personal hygiene are appropriate.  MEDICAL DECISION MAKING: Patient  here with suprapubic pain and bilateral testicular pain. Differential diagnosis includes UTI, epididymitis, renal stone. No signs of Fournier's gangrene on exam. Doubt torsion. Will obtain labs, urine, testicular ultrasound. Patient declines pain medication at this time.  ED PROGRESS: Patient's labs show leukocytosis of 12.6 with left shift. Creatinine is normal. Urine shows no hematuria or other sign of infection. Scrotal ultrasound shows a small right hydrocele and a small left epididymal cyst but there is no torsion or epididymitis. On reevaluation, patient still having very mild tenderness to palpation on his bilateral testicles without any scrotal masses or perineal changes. I do not feel he needs a CT of his abdomen and pelvis at this time but have discussed with him strict return precautions and supportive care instructions. Will discharge with prescription for Vicodin to use as needed for pain. He has  a PCP for followup. Patient and wife verbalize understanding and are comfortable with plan for discharge home.     Layla Maw Louvenia Golomb, DO 05/22/14 2015

## 2014-05-22 NOTE — Discharge Instructions (Signed)
Left Epidermal Cyst An epidermal cyst is sometimes called a sebaceous cyst, epidermal inclusion cyst, or infundibular cyst. These cysts usually contain a substance that looks "pasty" or "cheesy" and may have a bad smell. This substance is a protein called keratin. Epidermal cysts are usually found on the face, neck, or trunk. They may also occur in the vaginal area or other parts of the genitalia of both men and women. Epidermal cysts are usually small, painless, slow-growing bumps or lumps that move freely under the skin. It is important not to try to pop them. This may cause an infection and lead to tenderness and swelling. CAUSES  Epidermal cysts may be caused by a deep penetrating injury to the skin or a plugged hair follicle, often associated with acne. SYMPTOMS  Epidermal cysts can become inflamed and cause:  Redness.  Tenderness.  Increased temperature of the skin over the bumps or lumps.  Grayish-white, bad smelling material that drains from the bump or lump. DIAGNOSIS  Epidermal cysts are easily diagnosed by your caregiver during an exam. Rarely, a tissue sample (biopsy) may be taken to rule out other conditions that may resemble epidermal cysts. TREATMENT   Epidermal cysts often get better and disappear on their own. They are rarely ever cancerous.  If a cyst becomes infected, it may become inflamed and tender. This may require opening and draining the cyst. Treatment with antibiotics may be necessary. When the infection is gone, the cyst may be removed with minor surgery.  Small, inflamed cysts can often be treated with antibiotics or by injecting steroid medicines.  Sometimes, epidermal cysts become large and bothersome. If this happens, surgical removal in your caregiver's office may be necessary. HOME CARE INSTRUCTIONS  Only take over-the-counter or prescription medicines as directed by your caregiver.  Take your antibiotics as directed. Finish them even if you start to  feel better. SEEK MEDICAL CARE IF:   Your cyst becomes tender, red, or swollen.  Your condition is not improving or is getting worse.  You have any other questions or concerns. MAKE SURE YOU:  Understand these instructions.  Will watch your condition.  Will get help right away if you are not doing well or get worse. Document Released: 07/09/2004 Document Revised: 10/31/2011 Document Reviewed: 02/14/2011 [facility] Patient Information 2015 [place]. This information is not intended to replace advice given to you by your health care provider. Make sure you discuss any questions you have with your health care provider.  Right Hydrocele, Adult Fluid can collect around the testicles. This fluid forms in a sac. This condition is called a hydrocele. The collected fluid causes swelling of the scrotum. Usually, it affects just one testicle. Most of the time, the condition does not cause pain. Sometimes, the hydrocele goes away on its own. Other times, surgery is needed to get rid of the fluid. CAUSES A hydrocele does not develop often. Different things can cause a hydrocele in a man, including:  Injury to the scrotum.  Infection.  X-ray of the area around the scrotum.  A tumor or cancer of the testicle.  Twisting of a testicle.  Decreased blood flow to the scrotum. SYMPTOMS   Swelling without pain. The hydrocele feels like a water-filled balloon.  Swelling with pain. This can occur if the hydrocele was caused by infection or twisting.  Mild discomfort in the scrotum.  The hydrocele may feel heavy.  Swelling that gets smaller when you lie down. DIAGNOSIS  Your caregiver will do a physical exam  to decide if you have a hydrocele. This may include:  Asking questions about your overall health, today and in the past. Your caregiver may ask about any injuries, X-rays, or infections.  Pushing on your abdomen or asking you to change positions to see if the size of the hydrocele  changes.  Shining a light through the scrotum (transillumination) to see if the fluid inside the scrotum is clear.  Blood tests and urine tests to check for infection.  Imaging studies that take pictures of the scrotum and testicles. TREATMENT  Treatment depends in part on what caused the condition. Options include:  Watchful waiting. Your caregiver checks the hydrocele every so often.  Different surgeries to drain the fluid.  A needle may be put into the scrotum to drain fluid (needle aspiration). Fluid often returns after this type of treatment.  A cut (incision) may be made in the scrotum to remove the fluid sac (hydrocelectomy).  An incision may be made in the groin to repair a hydrocele that has contact with abdominal fluids (communicating hydrocele).  Medicines to treat an infection (antibiotics). HOME CARE INSTRUCTIONS  What you need to do at home may depend on the cause of the hydrocele and type of treatment. In general:  Take all medicine as directed by your caregiver. Follow the directions carefully.  Ask your caregiver if there is anything you should not do while you recover (activities, lifting, work, sex).  If you had surgery to repair a communicating hydrocele, recovery time may vary. Ask you caregiver about your recovery time.  Avoid heavy lifting for 4 to 6 weeks.  If you had an incision on the scrotum or groin, wash it for 2 to 3 days after surgery. Do this as long as the skin is closed and there are no gaps in the wound. Wash gently, and avoid rubbing the incision.  Keep all follow-up appointments. SEEK MEDICAL CARE IF:   Your scrotum seems to be getting larger.  The area becomes more and more uncomfortable. SEEK IMMEDIATE MEDICAL CARE IF:  You have a fever. Document Released: 01/26/2010 Document Revised: 05/29/2013 Document Reviewed: 01/26/2010 [facility] Patient Information 2015 [place], [place]. This information is not intended to replace advice given to  you by your health care provider. Make sure you discuss any questions you have with your health care provider.

## 2014-05-28 ENCOUNTER — Encounter: Payer: Self-pay | Admitting: Family Medicine

## 2014-05-28 ENCOUNTER — Ambulatory Visit (INDEPENDENT_AMBULATORY_CARE_PROVIDER_SITE_OTHER): Payer: Medicare HMO | Admitting: Family Medicine

## 2014-05-28 ENCOUNTER — Telehealth: Payer: Self-pay

## 2014-05-28 VITALS — BP 136/70 | HR 76 | Temp 99.0°F | Wt 160.8 lb

## 2014-05-28 DIAGNOSIS — N5082 Scrotal pain: Secondary | ICD-10-CM

## 2014-05-28 DIAGNOSIS — K219 Gastro-esophageal reflux disease without esophagitis: Secondary | ICD-10-CM

## 2014-05-28 DIAGNOSIS — N508 Other specified disorders of male genital organs: Secondary | ICD-10-CM

## 2014-05-28 MED ORDER — SUCRALFATE 1 G PO TABS: ORAL_TABLET | ORAL | Status: DC

## 2014-05-28 MED ORDER — SUCRALFATE 1 GM/10ML PO SUSP: ORAL | Status: DC

## 2014-05-28 NOTE — Progress Notes (Signed)
Pre visit review using our clinic review tool, if applicable. No additional management support is needed unless otherwise documented below in the visit note.  Pain in the groin/testicles, pain radiated into the groin. He thought he had a UTI.  He called and we asked him to go the ER.  No burning with urination.  No fevers.  No clear trigger for his pain.  U/s done at ER, with sig pathology noted.  D/w pt, reviewed. Labs from ER reviewed.  Still with some pain, but improved from prev.  He hasn't needed pain meds recently, so that marks his improvement.  GERD sx.  Worse when supine.  Doing well upright.  Already with HOB elevated.  Noted some ST, irritation likely from GERD.  He will occ regurgitated.  Already on PPI and H2 blocker. Chronic issue for patient.  No blood in stool, vomit. No upper abd pain now.   Meds, vitals, and allergies reviewed.   ROS: See HPI.  Otherwise, noncontributory.  nad ncat Mmm OP wnl Neck supple, no LA rrr Ctab abd soft not ttp Testes bilaterally descended without nodularity, tenderness or masses. No scrotal masses or lesions. No penis lesions or urethral discharge. No hernia felt on exam.

## 2014-05-28 NOTE — Telephone Encounter (Signed)
[facility] called; ins not approve carafate suspension and cost to pt was $400.00. Dr Para Marchuncan d/ced suspension and ordered carafate 1 gm tab with instructions take one tablet by mouth before laying down to nap or bedtime. # 30 x 1 refill. [facility]  Understanding.Pt waiting.

## 2014-05-28 NOTE — Patient Instructions (Signed)
Take sucralfate before laying down.  If the the abdominal pain isn't better, then we'll set you up with the GI clinic.  I think the groin pain will improve on its own. If not then let me know.  Take care.

## 2014-05-29 DIAGNOSIS — K219 Gastro-esophageal reflux disease without esophagitis: Secondary | ICD-10-CM | POA: Insufficient documentation

## 2014-05-29 DIAGNOSIS — N5082 Scrotal pain: Secondary | ICD-10-CM | POA: Insufficient documentation

## 2014-05-29 NOTE — Assessment & Plan Note (Signed)
Likely with referred pain to the lower abd, now improved w/o intervention, not needing pain meds.  D/w pt.  Wouldn't intervene for now.  No clear source but will likely continue to improve.  If not, we can refer to uro.  I would give this a little more time. He agrees.

## 2014-05-29 NOTE — Assessment & Plan Note (Signed)
W/o dysphagia recently.  Would add on carafate before laying down.  If still with sx, he'll notify us and we can refer to GI.  He agrees.  >25 minutes spent in face to face time with patient, >50% spent in counselling or coordination of care.

## 2014-05-29 NOTE — Telephone Encounter (Signed)
Thanks

## 2014-06-11 ENCOUNTER — Ambulatory Visit (INDEPENDENT_AMBULATORY_CARE_PROVIDER_SITE_OTHER): Payer: Medicare HMO

## 2014-06-11 DIAGNOSIS — Z23 Encounter for immunization: Secondary | ICD-10-CM

## 2014-06-26 ENCOUNTER — Other Ambulatory Visit: Payer: Self-pay | Admitting: Family Medicine

## 2014-07-03 ENCOUNTER — Other Ambulatory Visit: Payer: Self-pay | Admitting: Family Medicine

## 2014-07-06 ENCOUNTER — Emergency Department (HOSPITAL_COMMUNITY): Payer: Medicare HMO

## 2014-07-06 ENCOUNTER — Inpatient Hospital Stay (HOSPITAL_COMMUNITY)
Admission: EM | Admit: 2014-07-06 | Discharge: 2014-07-15 | DRG: 338 | Disposition: A | Discharge status: Home Health | Payer: Medicare HMO | Attending: Internal Medicine | Admitting: Internal Medicine

## 2014-07-06 ENCOUNTER — Encounter (HOSPITAL_COMMUNITY): Payer: Self-pay | Admitting: Emergency Medicine

## 2014-07-06 DIAGNOSIS — J209 Acute bronchitis, unspecified: Secondary | ICD-10-CM | POA: Diagnosis not present

## 2014-07-06 DIAGNOSIS — R410 Disorientation, unspecified: Secondary | ICD-10-CM | POA: Diagnosis present

## 2014-07-06 DIAGNOSIS — D6800 Von Willebrand disease, unspecified: Secondary | ICD-10-CM | POA: Diagnosis present

## 2014-07-06 DIAGNOSIS — I10 Essential (primary) hypertension: Secondary | ICD-10-CM | POA: Diagnosis present

## 2014-07-06 DIAGNOSIS — K429 Umbilical hernia without obstruction or gangrene: Secondary | ICD-10-CM | POA: Diagnosis present

## 2014-07-06 DIAGNOSIS — D649 Anemia, unspecified: Secondary | ICD-10-CM | POA: Diagnosis present

## 2014-07-06 DIAGNOSIS — K37 Unspecified appendicitis: Secondary | ICD-10-CM | POA: Insufficient documentation

## 2014-07-06 DIAGNOSIS — E785 Hyperlipidemia, unspecified: Secondary | ICD-10-CM | POA: Diagnosis present

## 2014-07-06 DIAGNOSIS — R339 Retention of urine, unspecified: Secondary | ICD-10-CM | POA: Insufficient documentation

## 2014-07-06 DIAGNOSIS — E7800 Pure hypercholesterolemia, unspecified: Secondary | ICD-10-CM | POA: Diagnosis present

## 2014-07-06 DIAGNOSIS — M549 Dorsalgia, unspecified: Secondary | ICD-10-CM

## 2014-07-06 DIAGNOSIS — R1031 Right lower quadrant pain: Secondary | ICD-10-CM

## 2014-07-06 DIAGNOSIS — E871 Hypo-osmolality and hyponatremia: Secondary | ICD-10-CM | POA: Diagnosis present

## 2014-07-06 DIAGNOSIS — E222 Syndrome of inappropriate secretion of antidiuretic hormone: Secondary | ICD-10-CM | POA: Diagnosis not present

## 2014-07-06 DIAGNOSIS — K2270 Barrett's esophagus without dysplasia: Secondary | ICD-10-CM | POA: Diagnosis present

## 2014-07-06 DIAGNOSIS — R197 Diarrhea, unspecified: Secondary | ICD-10-CM | POA: Insufficient documentation

## 2014-07-06 DIAGNOSIS — K352 Acute appendicitis with generalized peritonitis: Secondary | ICD-10-CM | POA: Diagnosis present

## 2014-07-06 DIAGNOSIS — J40 Bronchitis, not specified as acute or chronic: Secondary | ICD-10-CM | POA: Diagnosis present

## 2014-07-06 DIAGNOSIS — R1110 Vomiting, unspecified: Secondary | ICD-10-CM | POA: Insufficient documentation

## 2014-07-06 DIAGNOSIS — J9601 Acute respiratory failure with hypoxia: Secondary | ICD-10-CM | POA: Diagnosis not present

## 2014-07-06 DIAGNOSIS — R1030 Lower abdominal pain, unspecified: Secondary | ICD-10-CM | POA: Insufficient documentation

## 2014-07-06 DIAGNOSIS — H9190 Unspecified hearing loss, unspecified ear: Secondary | ICD-10-CM | POA: Diagnosis present

## 2014-07-06 DIAGNOSIS — R062 Wheezing: Secondary | ICD-10-CM

## 2014-07-06 DIAGNOSIS — J441 Chronic obstructive pulmonary disease with (acute) exacerbation: Secondary | ICD-10-CM | POA: Insufficient documentation

## 2014-07-06 DIAGNOSIS — Z87891 Personal history of nicotine dependence: Secondary | ICD-10-CM

## 2014-07-06 DIAGNOSIS — K219 Gastro-esophageal reflux disease without esophagitis: Secondary | ICD-10-CM | POA: Diagnosis present

## 2014-07-06 DIAGNOSIS — Z8711 Personal history of peptic ulcer disease: Secondary | ICD-10-CM | POA: Diagnosis not present

## 2014-07-06 DIAGNOSIS — R109 Unspecified abdominal pain: Secondary | ICD-10-CM

## 2014-07-06 DIAGNOSIS — K3580 Unspecified acute appendicitis: Secondary | ICD-10-CM

## 2014-07-06 DIAGNOSIS — E876 Hypokalemia: Secondary | ICD-10-CM | POA: Diagnosis not present

## 2014-07-06 DIAGNOSIS — J9811 Atelectasis: Secondary | ICD-10-CM

## 2014-07-06 LAB — COMPREHENSIVE METABOLIC PANEL
ALT: 10 U/L (ref 0–53)
AST: 19 U/L (ref 0–37)
Albumin: 3.9 g/dL (ref 3.5–5.2)
Alkaline Phosphatase: 112 U/L (ref 39–117)
Anion gap: 14 (ref 5–15)
BUN: 20 mg/dL (ref 6–23)
CO2: 26 mEq/L (ref 19–32)
Calcium: 9.4 mg/dL (ref 8.4–10.5)
Chloride: 98 mEq/L (ref 96–112)
Creatinine, Ser: 1.19 mg/dL (ref 0.50–1.35)
GFR calc Af Amer: 62 mL/min — ABNORMAL LOW (ref >90)
GFR calc non Af Amer: 53 mL/min — ABNORMAL LOW (ref >90)
Glucose, Bld: 105 mg/dL — ABNORMAL HIGH (ref 70–99)
Potassium: 3.9 mEq/L (ref 3.7–5.3)
Sodium: 138 mEq/L (ref 137–147)
Total Bilirubin: 0.3 mg/dL (ref 0.3–1.2)
Total Protein: 7.5 g/dL (ref 6.0–8.3)

## 2014-07-06 LAB — CBC WITH DIFFERENTIAL/PLATELET
Basophils Absolute: 0 10*3/uL (ref 0.0–0.1)
Basophils Relative: 0 % (ref 0–1)
Eosinophils Absolute: 0.1 10*3/uL (ref 0.0–0.7)
Eosinophils Relative: 1 % (ref 0–5)
HCT: 41.6 % (ref 39.0–52.0)
Hemoglobin: 13.5 g/dL (ref 13.0–17.0)
Lymphocytes Relative: 10 % — ABNORMAL LOW (ref 12–46)
Lymphs Abs: 1.6 10*3/uL (ref 0.7–4.0)
MCH: 28.6 pg (ref 26.0–34.0)
MCHC: 32.5 g/dL (ref 30.0–36.0)
MCV: 88.1 fL (ref 78.0–100.0)
Monocytes Absolute: 1.6 10*3/uL — ABNORMAL HIGH (ref 0.1–1.0)
Monocytes Relative: 10 % (ref 3–12)
Neutro Abs: 12.3 10*3/uL — ABNORMAL HIGH (ref 1.7–7.7)
Neutrophils Relative %: 79 % — ABNORMAL HIGH (ref 43–77)
Platelets: 283 10*3/uL (ref 150–400)
RBC: 4.72 MIL/uL (ref 4.22–5.81)
RDW: 13.2 % (ref 11.5–15.5)
WBC: 15.7 10*3/uL — ABNORMAL HIGH (ref 4.0–10.5)

## 2014-07-06 MED ORDER — METRONIDAZOLE IN NACL 5-0.79 MG/ML-% IV SOLN
500.0000 mg | Freq: Once | INTRAVENOUS | Status: AC
  Administered 2014-07-06: 500 mg via INTRAVENOUS
  Filled 2014-07-06: qty 100

## 2014-07-06 MED ORDER — HYDROMORPHONE HCL 1 MG/ML IJ SOLN
0.5000 mg | Freq: Once | INTRAMUSCULAR | Status: AC
  Administered 2014-07-06: 0.5 mg via INTRAVENOUS
  Filled 2014-07-06: qty 1

## 2014-07-06 MED ORDER — IOHEXOL 300 MG/ML  SOLN
100.0000 mL | Freq: Once | INTRAMUSCULAR | Status: AC | PRN
  Administered 2014-07-06: 100 mL via INTRAVENOUS

## 2014-07-06 MED ORDER — PANTOPRAZOLE SODIUM 40 MG IV SOLR: 40.0000 mg | INTRAVENOUS | Status: DC

## 2014-07-06 MED ORDER — SODIUM CHLORIDE 0.9 % IV SOLN: INTRAVENOUS | Status: DC

## 2014-07-06 MED ORDER — KCL IN DEXTROSE-NACL 20-5-0.9 MEQ/L-%-% IV SOLN
INTRAVENOUS | Status: DC
  Administered 2014-07-06: via INTRAVENOUS
  Filled 2014-07-06 (×3): qty 1000

## 2014-07-06 MED ORDER — CEFTRIAXONE SODIUM IN DEXTROSE 20 MG/ML IV SOLN
1.0000 g | Freq: Two times a day (BID) | INTRAVENOUS | Status: DC
  Administered 2014-07-07 – 2014-07-09 (×6): 1 g via INTRAVENOUS
  Filled 2014-07-06 (×8): qty 50

## 2014-07-06 MED ORDER — HYDROMORPHONE HCL 1 MG/ML IJ SOLN: 0.5000 mg | INTRAMUSCULAR | Status: DC | PRN

## 2014-07-06 MED ORDER — ONDANSETRON HCL 4 MG/2ML IJ SOLN
4.0000 mg | Freq: Four times a day (QID) | INTRAMUSCULAR | Status: DC | PRN
  Administered 2014-07-09: 4 mg via INTRAVENOUS
  Filled 2014-07-06: qty 2

## 2014-07-06 MED ORDER — HYDROMORPHONE HCL 1 MG/ML IJ SOLN
1.0000 mg | INTRAMUSCULAR | Status: DC | PRN
  Administered 2014-07-07 – 2014-07-10 (×9): 1 mg via INTRAVENOUS
  Filled 2014-07-06 (×10): qty 1

## 2014-07-06 MED ORDER — ONDANSETRON HCL 4 MG/2ML IJ SOLN: 4.0000 mg | Freq: Four times a day (QID) | INTRAMUSCULAR | Status: DC | PRN

## 2014-07-06 MED ORDER — CEFTRIAXONE SODIUM 1 G IJ SOLR
1.0000 g | Freq: Once | INTRAMUSCULAR | Status: AC
  Administered 2014-07-06: 1 g via INTRAVENOUS
  Filled 2014-07-06: qty 10

## 2014-07-06 MED ORDER — IOHEXOL 300 MG/ML  SOLN
25.0000 mL | Freq: Once | INTRAMUSCULAR | Status: AC | PRN
  Administered 2014-07-06: 25 mL via ORAL

## 2014-07-06 MED ORDER — ONDANSETRON HCL 4 MG/2ML IJ SOLN
4.0000 mg | Freq: Once | INTRAMUSCULAR | Status: AC
  Administered 2014-07-06: 4 mg via INTRAVENOUS
  Filled 2014-07-06: qty 2

## 2014-07-06 MED ORDER — METOPROLOL SUCCINATE ER 25 MG PO TB24
25.0000 mg | ORAL_TABLET | Freq: Every day | ORAL | Status: DC
  Administered 2014-07-06 – 2014-07-14 (×8): 25 mg via ORAL
  Filled 2014-07-06 (×12): qty 1

## 2014-07-06 MED ORDER — ACETAMINOPHEN 325 MG PO TABS
650.0000 mg | ORAL_TABLET | Freq: Four times a day (QID) | ORAL | Status: DC | PRN
  Administered 2014-07-08 – 2014-07-14 (×5): 650 mg via ORAL
  Filled 2014-07-06 (×5): qty 2

## 2014-07-06 MED ORDER — METRONIDAZOLE IN NACL 5-0.79 MG/ML-% IV SOLN
500.0000 mg | Freq: Three times a day (TID) | INTRAVENOUS | Status: DC
  Administered 2014-07-07 – 2014-07-10 (×10): 500 mg via INTRAVENOUS
  Filled 2014-07-06 (×12): qty 100

## 2014-07-06 MED ORDER — ACETAMINOPHEN 650 MG RE SUPP
650.0000 mg | Freq: Four times a day (QID) | RECTAL | Status: DC | PRN
Start: 2014-07-06 — End: 2014-07-15

## 2014-07-06 MED ORDER — FUROSEMIDE 40 MG PO TABS
40.0000 mg | ORAL_TABLET | Freq: Every day | ORAL | Status: DC
  Administered 2014-07-08 – 2014-07-15 (×8): 40 mg via ORAL
  Filled 2014-07-06 (×10): qty 1

## 2014-07-06 MED ORDER — DILTIAZEM HCL ER 240 MG PO CP24
240.0000 mg | ORAL_CAPSULE | Freq: Every day | ORAL | Status: DC
Start: 2014-07-07 — End: 2014-07-15
  Administered 2014-07-08 – 2014-07-15 (×8): 240 mg via ORAL
  Filled 2014-07-06 (×9): qty 1

## 2014-07-06 MED ORDER — ARTIFICIAL TEARS OP OINT
TOPICAL_OINTMENT | OPHTHALMIC | Status: DC | PRN
Start: 2014-07-06 — End: 2014-07-15

## 2014-07-06 MED ORDER — ENOXAPARIN SODIUM 40 MG/0.4ML ~~LOC~~ SOLN
40.0000 mg | SUBCUTANEOUS | Status: DC
  Filled 2014-07-06: qty 0.4

## 2014-07-06 MED ORDER — ONDANSETRON HCL 4 MG PO TABS
4.0000 mg | ORAL_TABLET | Freq: Four times a day (QID) | ORAL | Status: DC | PRN
  Administered 2014-07-08: 4 mg via ORAL
  Filled 2014-07-06: qty 1

## 2014-07-06 MED ORDER — PANTOPRAZOLE SODIUM 40 MG IV SOLR
40.0000 mg | Freq: Every day | INTRAVENOUS | Status: DC
  Administered 2014-07-06 – 2014-07-08 (×3): 40 mg via INTRAVENOUS
  Filled 2014-07-06 (×6): qty 40

## 2014-07-06 NOTE — ED Notes (Signed)
Per ems- pt reports R lower back pain x 3 days. Today pain has moved into abdomin and groin. Denies urinary sx, nv. Ems administered fentanyl pta. 18 G L ac in place

## 2014-07-06 NOTE — H&P (Signed)
[name] is an 80 y.o. male.   Chief Complaint: back and abdominal pain for 3 days. HPI: pt presents with 3 day hx of right lower back pain now with right lower abdominal pain.  Sharp without radiation.  Back pain better now.  Abdominal pain constant location RLQ no radiation. Hurts to move.  No nausea or vomiting.  No fevers at home.  No blood in stool.   Past Medical History  Diagnosis Date  . Barrett esophagus 09/2003  . GERD (gastroesophageal reflux disease)   . Hemorrhoids   . Diverticulosis   . Hyperlipidemia   . Hypertension   . Von Willebrand disease   . PUD (peptic ulcer disease)   . Hearing loss   . GI AVM (gastrointestinal arteriovenous vascular malformation)   . History of prostatitis   . Hemorrhoids   . Macular degeneration, wet     receiving intra-ocular injections ([facility])  . Back pain     improved with gabapentin as of 2015    Past Surgical History  Procedure Laterality Date  . Inguinal hernia repair  2001    Family History  Problem Relation Age of Onset  . Heart attack Father   . Coronary artery disease Father   . Hypertension Mother   . Pancreatic cancer Mother     Died at 46.  . Cancer Mother     Pancreatic cancer  . Arthritis Sister   . Hyperlipidemia Sister   . Prostate cancer Brother   . Cancer Brother     Bone Cancer secondary to Prostate Cancer  . Diabetes Neg Hx    Social History:  reports that he has quit smoking. He has never used smokeless tobacco. He reports that he does not drink alcohol or use illicit drugs.  Allergies:  Allergies  Allergen Reactions  . Atorvastatin     REACTION: muscles tense up   . Codeine Nausea And Vomiting  . Lisinopril Swelling    Lips and face swelling  . Statins Other (See Comments)    Muscle tense up     (Not in a hospital admission)  Results for orders placed or performed during the hospital encounter of 07/06/14 (from the past 48 hour(s))  CBC with Differential     Status: Abnormal   Collection Time: 07/06/14  5:30 PM  Result Value Ref Range   WBC 15.7 (H) 4.0 - 10.5 K/uL   RBC 4.72 4.22 - 5.81 MIL/uL   Hemoglobin 13.5 13.0 - 17.0 g/dL   HCT 41.6 39.0 - 52.0 %   MCV 88.1 78.0 - 100.0 fL   MCH 28.6 26.0 - 34.0 pg   MCHC 32.5 30.0 - 36.0 g/dL   RDW 13.2 11.5 - 15.5 %   Platelets 283 150 - 400 K/uL   Neutrophils Relative % 79 (H) 43 - 77 %   Neutro Abs 12.3 (H) 1.7 - 7.7 K/uL   Lymphocytes Relative 10 (L) 12 - 46 %   Lymphs Abs 1.6 0.7 - 4.0 K/uL   Monocytes Relative 10 3 - 12 %   Monocytes Absolute 1.6 (H) 0.1 - 1.0 K/uL   Eosinophils Relative 1 0 - 5 %   Eosinophils Absolute 0.1 0.0 - 0.7 K/uL   Basophils Relative 0 0 - 1 %   Basophils Absolute 0.0 0.0 - 0.1 K/uL  Comprehensive metabolic panel     Status: Abnormal   Collection Time: 07/06/14  5:30 PM  Result Value Ref Range   Sodium 138 137 -  147 mEq/L   Potassium 3.9 3.7 - 5.3 mEq/L   Chloride 98 96 - 112 mEq/L   CO2 26 19 - 32 mEq/L   Glucose, Bld 105 (H) 70 - 99 mg/dL   BUN 20 6 - 23 mg/dL   Creatinine, Ser 1.19 0.50 - 1.35 mg/dL   Calcium 9.4 8.4 - 10.5 mg/dL   Total Protein 7.5 6.0 - 8.3 g/dL   Albumin 3.9 3.5 - 5.2 g/dL   AST 19 0 - 37 U/L   ALT 10 0 - 53 U/L   Alkaline Phosphatase 112 39 - 117 U/L   Total Bilirubin 0.3 0.3 - 1.2 mg/dL   GFR calc non Af Amer 53 (L) >90 mL/min   GFR calc Af Amer 62 (L) >90 mL/min    Comment: (NOTE) The eGFR has been calculated using the CKD EPI equation. This calculation has not been validated in all clinical situations. eGFR's persistently <90 mL/min signify possible Chronic Kidney Disease.    Anion gap 14 5 - 15   Ct Abdomen Pelvis W Contrast  07/06/2014   CLINICAL DATA:  Right-sided flank pain for 3 days  EXAM: CT ABDOMEN AND PELVIS WITH CONTRAST  TECHNIQUE: Multidetector CT imaging of the abdomen and pelvis was performed using the standard protocol following bolus administration of intravenous contrast.  CONTRAST:  133m OMNIPAQUE IOHEXOL 300 MG/ML  SOLN   COMPARISON:  None.  FINDINGS: Lung bases are free of acute infiltrate or sizable effusion. A small hiatal hernia is noted which appears to be sliding-type in nature.  The liver, gallbladder, spleen, adrenal glands and pancreas are all normal in their CT appearance. Small calcified granulomas are noted within the spleen. Small duodenal diverticulum is noted adjacent to the head of the pancreas.  The kidneys are well visualized and demonstrate a normal enhancement pattern. A small cyst is noted in the lower pole of the right kidney. No calculi or obstructive changes are seen.  The appendix is dilated with significant wall thickening and periappendiceal inflammatory changes consistent with acute appendicitis. Scattered diverticular change is noted without evidence of diverticulitis. The bladder is well distended. No pelvic mass lesion is seen.  IMPRESSION: Findings consistent with acute appendicitis.  Chronic changes as described above.  These results were called by telephone at the time of interpretation on 07/06/2014 at 8:10 pm to Dr. WBlanchie Dessert, who verbally acknowledged these results.   Electronically Signed   By: MInez CatalinaM.D.   On: 07/06/2014 20:10    Review of Systems  Constitutional: Negative for fever and chills.  HENT: Negative.   Eyes: Negative.   Respiratory: Negative.   Cardiovascular: Negative for chest pain and leg swelling.  Gastrointestinal: Positive for abdominal pain. Negative for nausea.  Genitourinary: Negative.   Musculoskeletal: Negative.   Skin: Negative.   Neurological: Negative.   Endo/Heme/Allergies: Bruises/bleeds easily.  Psychiatric/Behavioral: Negative.     Blood pressure 148/63, pulse 94, temperature 98.6 F (37 C), temperature source Oral, resp. rate 19, height 5' 9" (1.753 m), weight 160 lb (72.576 kg), SpO2 89 %. Physical Exam  Constitutional: He is oriented to person, place, and time. He appears well-developed and well-nourished.  HENT:  Head:  Normocephalic.  Eyes: EOM are normal. Pupils are equal, round, and reactive to light. No scleral icterus.  Neck: Normal range of motion. Neck supple.  Cardiovascular: Normal rate and regular rhythm.   Respiratory: Effort normal and breath sounds normal.  GI: There is tenderness in the right lower quadrant. There  is rebound and guarding. There is no rigidity and negative Murphy's sign.  Musculoskeletal: Normal range of motion.  Neurological: He is alert and oriented to person, place, and time.  Skin: Skin is warm and dry.  Psychiatric: He has a normal mood and affect. His behavior is normal. Judgment and thought content normal.     Assessment/Plan Acute appendicitis with phlegmon Von Willebrand's disease  Wife not sure he has this. Asked medicine to see i consultation Hypertension medicine consult/ Admit for IV abx for tonight/NPO/ possible surgery in am.  Discussed medical and surgical management with pt and wife.  He has had symptoms for 3 days and has what appears to be a phlegmon without abscess.  Discussed appendectomy and issues with possible laparotomy at this point and possible bowel resection.  Went over medical management and success rates and downside to that.  Pt and wife agree on medical management at this point but open to surgery if unsuccessful.   Simeon Vera A. 07/06/2014, 9:29 PM

## 2014-07-06 NOTE — ED Provider Notes (Addendum)
CSN: [id number]     Arrival date & time 07/06/14  1720 History   First MD Initiated Contact with Patient 07/06/14 1744     Chief Complaint  Patient presents with  . Back Pain  . Abdominal Pain     (Consider location/radiation/quality/duration/timing/severity/associated sxs/prior Treatment) Patient is a 80 y.o. male presenting with abdominal pain. The history is provided by the patient.  Abdominal Pain Pain location:  RLQ and R flank Pain quality: aching, sharp and stabbing   Pain radiates to:  R flank Pain severity:  Severe Onset quality:  Gradual Duration:  3 days Timing:  Constant Progression:  Worsening Chronicity:  New Context comment:  States 3 days ago started to have some aching in the back and moved to the RLQ yesterday Relieved by:  Nothing Worsened by:  Movement and palpation Ineffective treatments:  None tried Associated symptoms: no anorexia, no chest pain, no constipation, no cough, no diarrhea, no dysuria, no fever, no nausea, no shortness of breath and no vomiting   Risk factors: no alcohol abuse, has not had multiple surgeries, no NSAID use, not obese and no recent hospitalization     Past Medical History  Diagnosis Date  . Barrett esophagus 09/2003  . GERD (gastroesophageal reflux disease)   . Hemorrhoids   . Diverticulosis   . Hyperlipidemia   . Hypertension   . Von Willebrand disease   . PUD (peptic ulcer disease)   . Hearing loss   . GI AVM (gastrointestinal arteriovenous vascular malformation)   . History of prostatitis   . Hemorrhoids   . Macular degeneration, wet     receiving intra-ocular injections ([facility]uen)  . Back pain     improved with gabapentin as of 2015   Past Surgical History  Procedure Laterality Date  . Inguinal hernia repair  2001   Family History  Problem Relation Age of Onset  . Heart attack Father   . Coronary artery disease Father   . Hypertension Mother   . Pancreatic cancer Mother     Died at 3386.  . Cancer Mother      Pancreatic cancer  . Arthritis Sister   . Hyperlipidemia Sister   . Prostate cancer Brother   . Cancer Brother     Bone Cancer secondary to Prostate Cancer  . Diabetes Neg Hx    History  Substance Use Topics  . Smoking status: Former Games developermoker  . Smokeless tobacco: Never Used     Comment: Quit 30 years ago as of 2014  . Alcohol Use: No    Review of Systems  Constitutional: Negative for fever.  Respiratory: Negative for cough and shortness of breath.   Cardiovascular: Negative for chest pain.  Gastrointestinal: Positive for abdominal pain. Negative for nausea, vomiting, diarrhea, constipation and anorexia.  Genitourinary: Negative for dysuria.  All other systems reviewed and are negative.     Allergies  Atorvastatin; Codeine; Lisinopril; and Statins  Home Medications   Prior to Admission medications   Medication Sig Start Date End Date Taking? Authorizing Provider  acetaminophen (TYLENOL) 500 MG tablet Take 1,000 mg by mouth every 6 (six) hours as needed for pain.    Historical Provider, MD  aspirin EC 81 MG tablet Take 81 mg by mouth every evening.    Historical Provider, MD  Bevacizumab (AVASTIN IV) Place into the right eye See admin instructions. Every 7 weeks    Historical Provider, MD  cetirizine (ZYRTEC) 10 MG tablet Take 10 mg by mouth daily as needed  for allergies.     Historical Provider, MD  diltiazem (DILACOR XR) 240 MG 24 hr capsule Take 1 capsule (240 mg total) by mouth daily. 08/12/13   Jacques Navy, MD  fluticasone (FLONASE) 50 MCG/ACT nasal spray Place 2 sprays into the nose daily as needed for allergies.     Historical Provider, MD  furosemide (LASIX) 40 MG tablet Take 1 tablet (40 mg total) by mouth daily. 05/13/14   Joaquim Nam, MD  gabapentin (NEURONTIN) 300 MG capsule Take 1 capsule (300 mg total) by mouth 2 (two) times daily.    Joaquim Nam, MD  gemfibrozil (LOPID) 600 MG tablet TAKE 1 TABLET BY MOUTH TWICE A DAY 06/26/14   Joaquim Nam, MD   HYDROcodone-acetaminophen (NORCO/VICODIN) 5-325 MG per tablet Take 1 tablet by mouth every 4 (four) hours as needed. 05/22/14   Kristen N Ward, DO  Hypromellose (ARTIFICIAL TEARS OP) Apply 1 drop to eye daily as needed (itchy / watery eyes).    Historical Provider, MD  metoprolol succinate (TOPROL-XL) 25 MG 24 hr tablet Take 1 tablet (25 mg total) by mouth at bedtime. 02/04/14   Joaquim Nam, MD  Multiple Vitamins-Minerals (OCUVITE ADULT 50+ PO) Take 1 capsule by mouth daily.      Historical Provider, MD  pantoprazole (PROTONIX) 40 MG tablet Take 40 mg by mouth daily before breakfast. 02/28/14 02/28/15  Joaquim Nam, MD  potassium chloride SA (K-DUR,KLOR-CON) 20 MEQ tablet TAKE 1 TABLET BY MOUTH DAILY 07/03/14   Joaquim Nam, MD  ranitidine (ZANTAC) 150 MG tablet Take 1 tablet (150 mg total) by mouth 2 (two) times daily. 08/12/13   Jacques Navy, MD  sucralfate (CARAFATE) 1 G tablet One tablet by mouth before laying down prior to nap or bedtime. 05/28/14   Joaquim Nam, MD   BP 153/64 mmHg  Pulse 69  Temp(Src) 98.6 F (37 C) (Oral)  Resp 18  Ht 5\' 9"  (1.753 m)  Wt 160 lb (72.576 kg)  BMI 23.62 kg/m2  SpO2 98% Physical Exam  Constitutional: He is oriented to person, place, and time. He appears well-developed and well-nourished. No distress.  HENT:  Head: Normocephalic and atraumatic.  Mouth/Throat: Oropharynx is clear and moist.  Eyes: Conjunctivae and EOM are normal. Pupils are equal, round, and reactive to light.  Neck: Normal range of motion. Neck supple.  Cardiovascular: Normal rate, regular rhythm and intact distal pulses.   No murmur heard. Pulmonary/Chest: Effort normal and breath sounds normal. No respiratory distress. He has no wheezes. He has no rales.  Abdominal: Soft. He exhibits no distension. There is tenderness in the right lower quadrant. There is rebound, guarding and tenderness at McBurney's point.  Reducible umbilical hernia that is non-tender   Musculoskeletal: Normal range of motion. He exhibits no edema or tenderness.  Neurological: He is alert and oriented to person, place, and time.  Skin: Skin is warm and dry. No rash noted. No erythema.  Psychiatric: He has a normal mood and affect. His behavior is normal.  Nursing note and vitals reviewed.   ED Course  Procedures (including critical care time) Labs Review Labs Reviewed  CBC WITH DIFFERENTIAL - Abnormal; Notable for the following:    WBC 15.7 (*)    Neutrophils Relative % 79 (*)    Neutro Abs 12.3 (*)    Lymphocytes Relative 10 (*)    Monocytes Absolute 1.6 (*)    All other components within normal limits  COMPREHENSIVE METABOLIC PANEL -  Abnormal; Notable for the following:    Glucose, Bld 105 (*)    GFR calc non Af Amer 53 (*)    GFR calc Af Amer 62 (*)    All other components within normal limits  URINALYSIS, ROUTINE W REFLEX MICROSCOPIC    Imaging Review Ct Abdomen Pelvis W Contrast  07/06/2014   CLINICAL DATA:  Right-sided flank pain for 3 days  EXAM: CT ABDOMEN AND PELVIS WITH CONTRAST  TECHNIQUE: Multidetector CT imaging of the abdomen and pelvis was performed using the standard protocol following bolus administration of intravenous contrast.  CONTRAST:  100mL OMNIPAQUE IOHEXOL 300 MG/ML  SOLN  COMPARISON:  None.  FINDINGS: Lung bases are free of acute infiltrate or sizable effusion. A small hiatal hernia is noted which appears to be sliding-type in nature.  The liver, gallbladder, spleen, adrenal glands and pancreas are all normal in their CT appearance. Small calcified granulomas are noted within the spleen. Small duodenal diverticulum is noted adjacent to the head of the pancreas.  The kidneys are well visualized and demonstrate a normal enhancement pattern. A small cyst is noted in the lower pole of the right kidney. No calculi or obstructive changes are seen.  The appendix is dilated with significant wall thickening and periappendiceal inflammatory changes  consistent with acute appendicitis. Scattered diverticular change is noted without evidence of diverticulitis. The bladder is well distended. No pelvic mass lesion is seen.  IMPRESSION: Findings consistent with acute appendicitis.  Chronic changes as described above.  These results were called by telephone at the time of interpretation on 07/06/2014 at 8:10 pm to Dr. Gwyneth SproutWHITNEY Everet Flagg , who verbally acknowledged these results.   Electronically Signed   By: Alcide CleverMark  Lukens M.D.   On: 07/06/2014 20:10     EKG Interpretation None      MDM   Final diagnoses:  Abdominal pain  Acute appendicitis, unspecified acute appendicitis type    Patient with right lower quadrant pain concerning for kidney stone versus appendicitis. CBC, CMP, UA, CT of the abdomen and pelvis pending. Patient given IV pain control. Vital signs are stable and patient is otherwise well-appearing. Low suspicion for dissection or AAA at this time. No evidence of incarcerated hernia.  8:44 PM Patient has a leukocytosis of 16,000 as CT evidence of an uncomplicated appendicitis. He was started on antibiotics and surgery was consultative. He does have a history of von Willebrand's disease. 8:56 PM Spoke with Dr. Luisa Hartornett.  Will consult medicine.  Pt started on rocephin and flagyl  Gwyneth SproutWhitney Min Tunnell, MD 07/06/14 [id number]  Gwyneth SproutWhitney Chevelle Durr, MD 07/06/14 [id number]  Gwyneth SproutWhitney Khriz Liddy, MD 07/06/14 2056

## 2014-07-06 NOTE — H&P (Signed)
[facility] Admission History and Physical       Martin IvanJohn P Zatarain JYN:[id number] DOB: 20-Jan-1926 DOA: 07/06/2014  Referring physician: EDP PCP: Crawford GivensGraham Duncan, MD  Specialists:   Chief Complaint:  RLQ ABD pain  HPI: [name] is a 80 y.o. male with a history of Von Willebrandt disease, HTN, and Hyperlipidemia who presents to the ED with complaints of 3 days of right sided back pain that began to radiate into the RLQ over the past 24 hours.    He denies having any fevers or chills or nausea or vomiting or diarrhea.     He was found to have appendicitis on CT scan of the ABD/Pelvis.    Dr Luisa Hartornett was consulted and plans for surgery in AM.       Review of Systems:  Constitutional: No Weight Loss, No Weight Gain, Night Sweats, Fevers, Chills, Dizziness, Fatigue, or Generalized Weakness HEENT: No Headaches, Difficulty Swallowing,Tooth/Dental Problems,Sore Throat,  No Sneezing, Rhinitis, Ear Ache, Nasal Congestion, or Post Nasal Drip,  Cardio-vascular:  No Chest pain, Orthopnea, PND, Edema in Lower Extremities, Anasarca, Dizziness, Palpitations  Resp: No Dyspnea, No DOE, No Productive Cough, No Non-Productive Cough, No Hemoptysis, No Wheezing.    GI: No Heartburn, Indigestion, +Abdominal Pain, Nausea, Vomiting, Diarrhea, Hematemesis, Hematochezia, Melena, Change in Bowel Habits,  Loss of Appetite  GU: No Dysuria, Change in Color of Urine, No Urgency or Frequency, No Flank pain.  Musculoskeletal: No Joint Pain or Swelling, No Decreased Range of Motion, No Back Pain.  Neurologic: No Syncope, No Seizures, Muscle Weakness, Paresthesia, Vision Disturbance or Loss, No Diplopia, No Vertigo, No Difficulty Walking,  Skin: No Rash or Lesions. Psych: No Change in Mood or Affect, No Depression or Anxiety, No Memory loss, No Confusion, or Hallucinations   Past Medical History  Diagnosis Date  . Barrett esophagus 09/2003  . GERD (gastroesophageal reflux disease)   . Hemorrhoids   .  Diverticulosis   . Hyperlipidemia   . Hypertension   . Von Willebrand disease   . PUD (peptic ulcer disease)   . Hearing loss   . GI AVM (gastrointestinal arteriovenous vascular malformation)   . History of prostatitis   . Hemorrhoids   . Macular degeneration, wet     receiving intra-ocular injections (Mc[facility]uen)  . Back pain     improved with gabapentin as of 2015      Past Surgical History  Procedure Laterality Date  . Inguinal hernia repair  2001       Prior to Admission medications   Medication Sig Start Date End Date Taking? Authorizing Provider  acetaminophen (TYLENOL) 500 MG tablet Take 1,000 mg by mouth every 6 (six) hours as needed for pain.    Historical Provider, MD  aspirin EC 81 MG tablet Take 81 mg by mouth every evening.    Historical Provider, MD  Bevacizumab (AVASTIN IV) Place into the right eye See admin instructions. Every 7 weeks    Historical Provider, MD  cetirizine (ZYRTEC) 10 MG tablet Take 10 mg by mouth daily as needed for allergies.     Historical Provider, MD  diltiazem (DILACOR XR) 240 MG 24 hr capsule Take 1 capsule (240 mg total) by mouth daily. 08/12/13   Jacques NavyMichael E Norins, MD  fluticasone (FLONASE) 50 MCG/ACT nasal spray Place 2 sprays into the nose daily as needed for allergies.     Historical Provider, MD  furosemide (LASIX) 40 MG tablet Take 1 tablet (40 mg total) by mouth daily. 05/13/14  Joaquim NamGraham S Duncan, MD  gabapentin (NEURONTIN) 300 MG capsule Take 1 capsule (300 mg total) by mouth 2 (two) times daily.    Joaquim NamGraham S Duncan, MD  gemfibrozil (LOPID) 600 MG tablet TAKE 1 TABLET BY MOUTH TWICE A DAY 06/26/14   Joaquim NamGraham S Duncan, MD  HYDROcodone-acetaminophen (NORCO/VICODIN) 5-325 MG per tablet Take 1 tablet by mouth every 4 (four) hours as needed. 05/22/14   Kristen N Ward, DO  Hypromellose (ARTIFICIAL TEARS OP) Apply 1 drop to eye daily as needed (itchy / watery eyes).    Historical Provider, MD  metoprolol succinate (TOPROL-XL) 25 MG 24 hr tablet Take  1 tablet (25 mg total) by mouth at bedtime. 02/04/14   Joaquim NamGraham S Duncan, MD  Multiple Vitamins-Minerals (OCUVITE ADULT 50+ PO) Take 1 capsule by mouth daily.      Historical Provider, MD  pantoprazole (PROTONIX) 40 MG tablet Take 40 mg by mouth daily before breakfast. 02/28/14 02/28/15  Joaquim NamGraham S Duncan, MD  potassium chloride SA (K-DUR,KLOR-CON) 20 MEQ tablet TAKE 1 TABLET BY MOUTH DAILY 07/03/14   Joaquim NamGraham S Duncan, MD  ranitidine (ZANTAC) 150 MG tablet Take 1 tablet (150 mg total) by mouth 2 (two) times daily. 08/12/13   Jacques NavyMichael E Norins, MD  sucralfate (CARAFATE) 1 G tablet One tablet by mouth before laying down prior to nap or bedtime. 05/28/14   Joaquim NamGraham S Duncan, MD      Allergies  Allergen Reactions  . Atorvastatin     REACTION: muscles tense up   . Codeine Nausea And Vomiting  . Lisinopril Swelling    Lips and face swelling  . Statins Other (See Comments)    Muscle tense up     Social History:  reports that he has quit smoking. He has never used smokeless tobacco. He reports that he does not drink alcohol or use illicit drugs.     Family History  Problem Relation Age of Onset  . Heart attack Father   . Coronary artery disease Father   . Hypertension Mother   . Pancreatic cancer Mother     Died at 2586.  . Cancer Mother     Pancreatic cancer  . Arthritis Sister   . Hyperlipidemia Sister   . Prostate cancer Brother   . Cancer Brother     Bone Cancer secondary to Prostate Cancer  . Diabetes Neg Hx        Physical Exam:  GEN:  Pleasant  80 y.o. male  examined  and in no acute distress; cooperative with exam Filed Vitals:   07/06/14 2100 07/06/14 2115 07/06/14 2130 07/06/14 2145  BP: 148/63 143/75 130/74 139/66  Pulse: 94 95 91 90  Temp:      TempSrc:      Resp: 19 12 21 19   Height:      Weight:      SpO2: 89% 92% 90% 92%   Blood pressure 139/66, pulse 90, temperature 98.6 F (37 C), temperature source Oral, resp. rate 19, height 5\' 9"  (1.753 m), weight 72.576 kg (160  lb), SpO2 92 %. PSYCH: He is alert and oriented x4; does not appear anxious does not appear depressed; affect is normal HEENT: Normocephalic and Atraumatic, Mucous membranes pink; PERRLA; EOM intact; Fundi:  Benign;  No scleral icterus, Nares: Patent, Oropharynx: Clear, Fair Dentition,    Neck:  FROM, No Cervical Lymphadenopathy nor Thyromegaly or Carotid Bruit; No JVD; Breasts:: Not examined CHEST WALL: No tenderness CHEST: Normal respiration, clear to auscultation bilaterally HEART: Regular rate  and rhythm; no murmurs rubs or gallops BACK: No kyphosis or scoliosis; No CVA tenderness ABDOMEN: Positive Bowel Sounds, Soft , mildly tender in RLQ; No Masses, No Organomegaly Rectal Exam: Not done EXTREMITIES: No Cyanosis, Clubbing, or Edema; No Ulcerations. Genitalia: not examined PULSES: 2+ and symmetric SKIN: Normal hydration no rash or ulceration CNS:  Alert and Oriented x 4, No Focal Deficits  Vascular: pulses palpable throughout    Labs on Admission:  Basic Metabolic Panel:  Recent Labs Lab 07/06/14 1730  NA 138  K 3.9  CL 98  CO2 26  GLUCOSE 105*  BUN 20  CREATININE 1.19  CALCIUM 9.4   Liver Function Tests:  Recent Labs Lab 07/06/14 1730  AST 19  ALT 10  ALKPHOS 112  BILITOT 0.3  PROT 7.5  ALBUMIN 3.9   No results for input(s): LIPASE, AMYLASE in the last 168 hours. No results for input(s): AMMONIA in the last 168 hours. CBC:  Recent Labs Lab 07/06/14 1730  WBC 15.7*  NEUTROABS 12.3*  HGB 13.5  HCT 41.6  MCV 88.1  PLT 283   Cardiac Enzymes: No results for input(s): CKTOTAL, CKMB, CKMBINDEX, TROPONINI in the last 168 hours.  BNP (last 3 results) No results for input(s): PROBNP in the last 8760 hours. CBG: No results for input(s): GLUCAP in the last 168 hours.  Radiological Exams on Admission: Ct Abdomen Pelvis W Contrast  07/06/2014   CLINICAL DATA:  Right-sided flank pain for 3 days  EXAM: CT ABDOMEN AND PELVIS WITH CONTRAST  TECHNIQUE:  Multidetector CT imaging of the abdomen and pelvis was performed using the standard protocol following bolus administration of intravenous contrast.  CONTRAST:  OMNIPAQUE IOHEXOL 300 MG/ML  SOLN  COMPARISON:  None.  FINDINGS: Lung bases are free of acute infiltrate or sizable effusion. A small hiatal hernia is noted which appears to be sliding-type in nature.  The liver, gallbladder, spleen, adrenal glands and pancreas are all normal in their CT appearance. Small calcified granulomas are noted within the spleen. Small duodenal diverticulum is noted adjacent to the head of the pancreas.  The kidneys are well visualized and demonstrate a normal enhancement pattern. A small cyst is noted in the lower pole of the right kidney. No calculi or obstructive changes are seen.  The appendix is dilated with significant wall thickening and periappendiceal inflammatory changes consistent with acute appendicitis. Scattered diverticular change is noted without evidence of diverticulitis. The bladder is well distended. No pelvic mass lesion is seen.  IMPRESSION: Findings consistent with acute appendicitis.  Chronic changes as described above.  These results were called by telephone at the time of interpretation on 07/06/2014 at 8:10 pm to Dr. Gwyneth Sprout , who verbally acknowledged these results.   Electronically Signed   By: Alcide Clever M.D.   On: 07/06/2014 20:10     EKG: Independently reviewed.    Assessment/Plan:   80 y.o. male with   Principal Problem:   1.   Appendicitis, acute   General Surgery Dr. Luisa Hart   NPO after Midnight   Surgery in AM   IVFs   Pain Control with IV Dilaudid PRN      Active Problems:   2.   Von Willebrand's disease   hx     3.   Essential hypertension   Hold PO Meds   PRN IV Hydralazine    Monitor BPs    4.   Hypercholesteremia   On Statin Rx    5.   GERD (  gastroesophageal reflux disease)   IV Protonix    6.   DVT Prophylaxis   SCDs   Code Status:    FULL CODE Family Communication:  Wife at Bedside   Disposition Plan:     Inpatient to Med/Surg  Time spent:  64 Minutes    Ron Parker [facility]   Pager [phone]   If 7AM -7PM Please Contact the Day Rounding Team MD for [facility]  If 7PM-7AM, Please Contact Night-Floor Coverage  [email or web address] Password [email or web address] 07/06/2014, 10:06 PM

## 2014-07-07 ENCOUNTER — Inpatient Hospital Stay (HOSPITAL_COMMUNITY): Payer: Medicare HMO

## 2014-07-07 ENCOUNTER — Inpatient Hospital Stay (HOSPITAL_COMMUNITY): Payer: Medicare HMO | Admitting: Anesthesiology

## 2014-07-07 ENCOUNTER — Encounter (HOSPITAL_COMMUNITY): Payer: Self-pay | Admitting: Anesthesiology

## 2014-07-07 ENCOUNTER — Encounter (HOSPITAL_COMMUNITY)
Admission: EM | Disposition: A | Discharge status: Home Health | Payer: Self-pay | Source: Home / Self Care | Attending: Internal Medicine

## 2014-07-07 HISTORY — PX: LAPAROSCOPIC APPENDECTOMY: SHX408

## 2014-07-07 LAB — COMPREHENSIVE METABOLIC PANEL
ALT: 9 U/L (ref 0–53)
AST: 17 U/L (ref 0–37)
Albumin: 3.1 g/dL — ABNORMAL LOW (ref 3.5–5.2)
Alkaline Phosphatase: 80 U/L (ref 39–117)
Anion gap: 13 (ref 5–15)
BUN: 23 mg/dL (ref 6–23)
CO2: 22 mEq/L (ref 19–32)
Calcium: 8.5 mg/dL (ref 8.4–10.5)
Chloride: 99 mEq/L (ref 96–112)
Creatinine, Ser: 1.62 mg/dL — ABNORMAL HIGH (ref 0.50–1.35)
GFR calc Af Amer: 43 mL/min — ABNORMAL LOW (ref >90)
GFR calc non Af Amer: 37 mL/min — ABNORMAL LOW (ref >90)
Glucose, Bld: 127 mg/dL — ABNORMAL HIGH (ref 70–99)
Potassium: 5.5 mEq/L — ABNORMAL HIGH (ref 3.7–5.3)
Sodium: 134 mEq/L — ABNORMAL LOW (ref 137–147)
Total Bilirubin: 0.6 mg/dL (ref 0.3–1.2)
Total Protein: 6.7 g/dL (ref 6.0–8.3)

## 2014-07-07 LAB — URINALYSIS, ROUTINE W REFLEX MICROSCOPIC
Bilirubin Urine: NEGATIVE
Glucose, UA: NEGATIVE mg/dL
Hgb urine dipstick: NEGATIVE
Ketones, ur: NEGATIVE mg/dL
Leukocytes, UA: NEGATIVE
Nitrite: NEGATIVE
Protein, ur: NEGATIVE mg/dL
Specific Gravity, Urine: <1.005 — ABNORMAL LOW (ref 1.005–1.030)
Urobilinogen, UA: 0.2 mg/dL (ref 0.0–1.0)
pH: 5 (ref 5.0–8.0)

## 2014-07-07 LAB — SURGICAL PCR SCREEN
MRSA, PCR: NEGATIVE
Staphylococcus aureus: NEGATIVE

## 2014-07-07 LAB — CBC
HCT: 38.3 % — ABNORMAL LOW (ref 39.0–52.0)
Hemoglobin: 12.2 g/dL — ABNORMAL LOW (ref 13.0–17.0)
MCH: 28.2 pg (ref 26.0–34.0)
MCHC: 31.9 g/dL (ref 30.0–36.0)
MCV: 88.7 fL (ref 78.0–100.0)
Platelets: 236 10*3/uL (ref 150–400)
RBC: 4.32 MIL/uL (ref 4.22–5.81)
RDW: 13.5 % (ref 11.5–15.5)
WBC: 23.9 10*3/uL — ABNORMAL HIGH (ref 4.0–10.5)

## 2014-07-07 LAB — PROTIME-INR
INR: 1.25 (ref 0.00–1.49)
Prothrombin Time: 15.8 seconds — ABNORMAL HIGH (ref 11.6–15.2)

## 2014-07-07 LAB — APTT: aPTT: 37 seconds (ref 24–37)

## 2014-07-07 SURGERY — APPENDECTOMY, LAPAROSCOPIC
Anesthesia: General | Site: Abdomen

## 2014-07-07 MED ORDER — ENOXAPARIN SODIUM 40 MG/0.4ML ~~LOC~~ SOLN: 40.0000 mg | SUBCUTANEOUS | Status: DC

## 2014-07-07 MED ORDER — POTASSIUM CHLORIDE CRYS ER 20 MEQ PO TBCR
20.0000 meq | EXTENDED_RELEASE_TABLET | Freq: Every day | ORAL | Status: DC
  Administered 2014-07-09 – 2014-07-11 (×3): 20 meq via ORAL
  Filled 2014-07-07 (×6): qty 1

## 2014-07-07 MED ORDER — BUPIVACAINE-EPINEPHRINE (PF) 0.25% -1:200000 IJ SOLN
INTRAMUSCULAR | Status: AC
  Filled 2014-07-07: qty 30

## 2014-07-07 MED ORDER — HYDROMORPHONE HCL 1 MG/ML IJ SOLN
0.2500 mg | INTRAMUSCULAR | Status: DC | PRN
  Administered 2014-07-07 (×2): 0.5 mg via INTRAVENOUS

## 2014-07-07 MED ORDER — ARTIFICIAL TEARS OP OINT
TOPICAL_OINTMENT | OPHTHALMIC | Status: AC
  Filled 2014-07-07: qty 3.5

## 2014-07-07 MED ORDER — HYDROMORPHONE HCL 1 MG/ML IJ SOLN
INTRAMUSCULAR | Status: AC
  Filled 2014-07-07: qty 1

## 2014-07-07 MED ORDER — SODIUM CHLORIDE 0.9 % IR SOLN
Status: DC | PRN
  Administered 2014-07-07: 1000 mL

## 2014-07-07 MED ORDER — BUPIVACAINE-EPINEPHRINE 0.25% -1:200000 IJ SOLN
INTRAMUSCULAR | Status: DC | PRN
  Administered 2014-07-07: 30 mL

## 2014-07-07 MED ORDER — FENTANYL CITRATE 0.05 MG/ML IJ SOLN
INTRAMUSCULAR | Status: DC | PRN
  Administered 2014-07-07: 25 ug via INTRAVENOUS
  Administered 2014-07-07: 100 ug via INTRAVENOUS

## 2014-07-07 MED ORDER — SODIUM CHLORIDE 0.9 % IV SOLN
20.0000 ug | Freq: Every day | INTRAVENOUS | Status: AC
  Administered 2014-07-08 – 2014-07-09 (×2): 20 ug via INTRAVENOUS
  Filled 2014-07-07 (×2): qty 5

## 2014-07-07 MED ORDER — NEOSTIGMINE METHYLSULFATE 10 MG/10ML IV SOLN
INTRAVENOUS | Status: AC
  Filled 2014-07-07: qty 1

## 2014-07-07 MED ORDER — OXYCODONE HCL 5 MG/5ML PO SOLN: 5.0000 mg | Freq: Once | ORAL | Status: DC | PRN

## 2014-07-07 MED ORDER — FENTANYL CITRATE 0.05 MG/ML IJ SOLN
INTRAMUSCULAR | Status: AC
  Filled 2014-07-07: qty 5

## 2014-07-07 MED ORDER — GLYCOPYRROLATE 0.2 MG/ML IJ SOLN
INTRAMUSCULAR | Status: AC
  Filled 2014-07-07: qty 2

## 2014-07-07 MED ORDER — PHENYLEPHRINE HCL 10 MG/ML IJ SOLN
INTRAMUSCULAR | Status: DC | PRN
  Administered 2014-07-07 (×3): 80 ug via INTRAVENOUS

## 2014-07-07 MED ORDER — NEOSTIGMINE METHYLSULFATE 10 MG/10ML IV SOLN
INTRAVENOUS | Status: DC | PRN
  Administered 2014-07-07: 4 mg via INTRAVENOUS

## 2014-07-07 MED ORDER — ENOXAPARIN SODIUM 30 MG/0.3ML ~~LOC~~ SOLN
30.0000 mg | SUBCUTANEOUS | Status: DC
  Administered 2014-07-08 – 2014-07-09 (×2): 30 mg via SUBCUTANEOUS
  Filled 2014-07-07 (×3): qty 0.3

## 2014-07-07 MED ORDER — SODIUM CHLORIDE 0.9 % IV SOLN
INTRAVENOUS | Status: DC
  Administered 2014-07-07 – 2014-07-09 (×5): via INTRAVENOUS

## 2014-07-07 MED ORDER — ROCURONIUM BROMIDE 50 MG/5ML IV SOLN
INTRAVENOUS | Status: AC
  Filled 2014-07-07: qty 1

## 2014-07-07 MED ORDER — SUCCINYLCHOLINE CHLORIDE 20 MG/ML IJ SOLN
INTRAMUSCULAR | Status: DC | PRN
  Administered 2014-07-07: 100 mg via INTRAVENOUS

## 2014-07-07 MED ORDER — ONDANSETRON HCL 4 MG/2ML IJ SOLN
INTRAMUSCULAR | Status: DC | PRN
  Administered 2014-07-07: 4 mg via INTRAVENOUS

## 2014-07-07 MED ORDER — 0.9 % SODIUM CHLORIDE (POUR BTL) OPTIME
TOPICAL | Status: DC | PRN
  Administered 2014-07-07 (×2): 1000 mL

## 2014-07-07 MED ORDER — ROCURONIUM BROMIDE 100 MG/10ML IV SOLN
INTRAVENOUS | Status: DC | PRN
  Administered 2014-07-07 (×2): 5 mg via INTRAVENOUS
  Administered 2014-07-07: 25 mg via INTRAVENOUS

## 2014-07-07 MED ORDER — OXYCODONE HCL 5 MG PO TABS: 5.0000 mg | ORAL_TABLET | Freq: Once | ORAL | Status: DC | PRN

## 2014-07-07 MED ORDER — LIDOCAINE HCL (CARDIAC) 20 MG/ML IV SOLN
INTRAVENOUS | Status: DC | PRN
  Administered 2014-07-07: 80 mg via INTRAVENOUS

## 2014-07-07 MED ORDER — ONDANSETRON HCL 4 MG/2ML IJ SOLN: 4.0000 mg | Freq: Once | INTRAMUSCULAR | Status: DC | PRN

## 2014-07-07 MED ORDER — PROPOFOL 10 MG/ML IV BOLUS
INTRAVENOUS | Status: DC | PRN
  Administered 2014-07-07: 100 mg via INTRAVENOUS

## 2014-07-07 MED ORDER — LORATADINE 10 MG PO TABS
10.0000 mg | ORAL_TABLET | Freq: Every day | ORAL | Status: DC
  Administered 2014-07-07 – 2014-07-15 (×9): 10 mg via ORAL
  Filled 2014-07-07 (×11): qty 1

## 2014-07-07 MED ORDER — GABAPENTIN 300 MG PO CAPS
300.0000 mg | ORAL_CAPSULE | Freq: Two times a day (BID) | ORAL | Status: DC
  Administered 2014-07-07 – 2014-07-15 (×17): 300 mg via ORAL
  Filled 2014-07-07 (×21): qty 1

## 2014-07-07 MED ORDER — ONDANSETRON HCL 4 MG/2ML IJ SOLN
INTRAMUSCULAR | Status: AC
  Filled 2014-07-07: qty 2

## 2014-07-07 MED ORDER — GEMFIBROZIL 600 MG PO TABS
600.0000 mg | ORAL_TABLET | Freq: Two times a day (BID) | ORAL | Status: DC
  Administered 2014-07-07 – 2014-07-15 (×16): 600 mg via ORAL
  Filled 2014-07-07 (×20): qty 1

## 2014-07-07 MED ORDER — SODIUM CHLORIDE 0.9 % IV SOLN
20.0000 ug | INTRAVENOUS | Status: AC
  Administered 2014-07-07: 20 ug via INTRAVENOUS
  Filled 2014-07-07: qty 5

## 2014-07-07 MED ORDER — DEXAMETHASONE SODIUM PHOSPHATE 4 MG/ML IJ SOLN
INTRAMUSCULAR | Status: DC | PRN
  Administered 2014-07-07: 4 mg via INTRAVENOUS

## 2014-07-07 MED ORDER — PROPOFOL 10 MG/ML IV BOLUS
INTRAVENOUS | Status: AC
  Filled 2014-07-07: qty 20

## 2014-07-07 MED ORDER — MEPERIDINE HCL 25 MG/ML IJ SOLN: 6.2500 mg | INTRAMUSCULAR | Status: DC | PRN

## 2014-07-07 MED ORDER — LACTATED RINGERS IV SOLN: INTRAVENOUS | Status: DC | PRN

## 2014-07-07 MED ORDER — PANTOPRAZOLE SODIUM 40 MG PO TBEC
40.0000 mg | DELAYED_RELEASE_TABLET | Freq: Every day | ORAL | Status: DC
Start: 2014-07-08 — End: 2014-07-07

## 2014-07-07 MED ORDER — GLYCOPYRROLATE 0.2 MG/ML IJ SOLN
INTRAMUSCULAR | Status: DC | PRN
  Administered 2014-07-07: 0.6 mg via INTRAVENOUS

## 2014-07-07 SURGICAL SUPPLY — 82 items
APL SKNCLS STERI-STRIP NONHPOA (GAUZE/BANDAGES/DRESSINGS) ×4
APPLIER CLIP LOGIC TI 5 (MISCELLANEOUS) IMPLANT
APPLIER CLIP ROT 10 11.4 M/L (STAPLE)
APR CLP MED LRG 11.4X10 (STAPLE)
APR CLP MED LRG 33X5 (MISCELLANEOUS)
BAG SPEC RTRVL LRG 6X4 10 (ENDOMECHANICALS) ×2
BENZOIN TINCTURE PRP APPL 2/3 (GAUZE/BANDAGES/DRESSINGS) ×5 IMPLANT
BLADE SURG ROTATE 9660 (MISCELLANEOUS) IMPLANT
CANISTER SUCTION 2500CC (MISCELLANEOUS) ×3 IMPLANT
CHLORAPREP W/TINT 26ML (MISCELLANEOUS) ×3 IMPLANT
CLIP APPLIE ROT 10 11.4 M/L (STAPLE) IMPLANT
COVER MAYO STAND STRL (DRAPES) IMPLANT
COVER SURGICAL LIGHT HANDLE (MISCELLANEOUS) ×3 IMPLANT
CUTTER LINEAR ENDO 35 ETS (STAPLE) ×2 IMPLANT
DECANTER SPIKE VIAL GLASS SM (MISCELLANEOUS) ×3 IMPLANT
DEVICE SECURE STRAP 25 ABSORB (INSTRUMENTS) ×6 IMPLANT
DEVICE TROCAR PUNCTURE CLOSURE (ENDOMECHANICALS) ×3 IMPLANT
DRAPE LAPAROSCOPIC ABDOMINAL (DRAPES) ×3 IMPLANT
DRAPE PROXIMA HALF (DRAPES) IMPLANT
DRAPE UTILITY 15X26 W/TAPE STR (DRAPE) ×6 IMPLANT
DRAPE WARM FLUID 44X44 (DRAPE) ×3 IMPLANT
DRSG OPSITE POSTOP 4X10 (GAUZE/BANDAGES/DRESSINGS) IMPLANT
DRSG OPSITE POSTOP 4X8 (GAUZE/BANDAGES/DRESSINGS) IMPLANT
DRSG TEGADERM 2-3/8X2-3/4 SM (GAUZE/BANDAGES/DRESSINGS) ×4 IMPLANT
DRSG TEGADERM 4X4.75 (GAUZE/BANDAGES/DRESSINGS) ×2 IMPLANT
ELECT BLADE 6.5 EXT (BLADE) IMPLANT
ELECT CAUTERY BLADE 6.4 (BLADE) ×6 IMPLANT
ELECT REM PT RETURN 9FT ADLT (ELECTROSURGICAL) ×3
ELECTRODE REM PT RTRN 9FT ADLT (ELECTROSURGICAL) ×2 IMPLANT
GLOVE BIO SURGEON STRL SZ 6.5 (GLOVE) ×4 IMPLANT
GLOVE BIO SURGEON STRL SZ7 (GLOVE) ×3 IMPLANT
GLOVE BIO SURGEON STRL SZ7.5 (GLOVE) ×4 IMPLANT
GLOVE BIO SURGEON STRL SZ8 (GLOVE) ×4 IMPLANT
GLOVE BIOGEL PI IND STRL 7.0 (GLOVE) ×2 IMPLANT
GLOVE BIOGEL PI IND STRL 7.5 (GLOVE) ×2 IMPLANT
GLOVE BIOGEL PI IND STRL 8 (GLOVE) ×2 IMPLANT
GLOVE BIOGEL PI INDICATOR 7.0 (GLOVE) ×2
GLOVE BIOGEL PI INDICATOR 7.5 (GLOVE) ×1
GLOVE BIOGEL PI INDICATOR 8 (GLOVE) ×2
GOWN STRL REUS W/ TWL LRG LVL3 (GOWN DISPOSABLE) ×6 IMPLANT
GOWN STRL REUS W/ TWL XL LVL3 (GOWN DISPOSABLE) ×2 IMPLANT
GOWN STRL REUS W/TWL LRG LVL3 (GOWN DISPOSABLE) ×9
GOWN STRL REUS W/TWL XL LVL3 (GOWN DISPOSABLE) ×6
KIT BASIN OR (CUSTOM PROCEDURE TRAY) ×3 IMPLANT
KIT ROOM TURNOVER OR (KITS) ×3 IMPLANT
LIGASURE IMPACT 36 18CM CVD LR (INSTRUMENTS) IMPLANT
MARKER SKIN DUAL TIP RULER LAB (MISCELLANEOUS) ×3 IMPLANT
NDL SPNL 22GX3.5 QUINCKE BK (NEEDLE) ×1 IMPLANT
NEEDLE SPNL 22GX3.5 QUINCKE BK (NEEDLE) ×3 IMPLANT
NS IRRIG 1000ML POUR BTL (IV SOLUTION) ×6 IMPLANT
PACK GENERAL/GYN (CUSTOM PROCEDURE TRAY) ×3 IMPLANT
PAD ARMBOARD 7.5X6 YLW CONV (MISCELLANEOUS) ×6 IMPLANT
PENCIL BUTTON HOLSTER BLD 10FT (ELECTRODE) IMPLANT
POUCH SPECIMEN RETRIEVAL 10MM (ENDOMECHANICALS) ×2 IMPLANT
SCALPEL HARMONIC ACE (MISCELLANEOUS) ×2 IMPLANT
SET IRRIG TUBING LAPAROSCOPIC (IRRIGATION / IRRIGATOR) ×2 IMPLANT
SOLUTION ANTI FOG 6CC (MISCELLANEOUS) ×2 IMPLANT
SPECIMEN JAR LARGE (MISCELLANEOUS) IMPLANT
SPONGE LAP 18X18 X RAY DECT (DISPOSABLE) IMPLANT
STAPLER VISISTAT 35W (STAPLE) ×3 IMPLANT
SUCTION POOLE TIP (SUCTIONS) ×3 IMPLANT
SUT MNCRL AB 4-0 PS2 18 (SUTURE) ×8 IMPLANT
SUT NOVA 0 T19/GS 22DT (SUTURE) ×6 IMPLANT
SUT PDS AB 1 TP1 96 (SUTURE) ×6 IMPLANT
SUT SILK 2 0 SH CR/8 (SUTURE) ×3 IMPLANT
SUT SILK 2 0 TIES 10X30 (SUTURE) ×3 IMPLANT
SUT SILK 3 0 SH CR/8 (SUTURE) ×3 IMPLANT
SUT SILK 3 0 TIES 10X30 (SUTURE) ×3 IMPLANT
SUT VIC AB 3-0 SH 18 (SUTURE) IMPLANT
SUT VICRYL 0 UR6 27IN ABS (SUTURE) ×2 IMPLANT
TOWEL OR 17X24 6PK STRL BLUE (TOWEL DISPOSABLE) ×3 IMPLANT
TOWEL OR 17X26 10 PK STRL BLUE (TOWEL DISPOSABLE) ×3 IMPLANT
TRAY FOLEY CATH 14FRSI W/METER (CATHETERS) IMPLANT
TRAY FOLEY CATH 16FRSI W/METER (SET/KITS/TRAYS/PACK) IMPLANT
TRAY LAPAROSCOPIC (CUSTOM PROCEDURE TRAY) ×3 IMPLANT
TROCAR XCEL BLUNT TIP 100MML (ENDOMECHANICALS) ×5 IMPLANT
TROCAR XCEL NON-BLD 11X100MML (ENDOMECHANICALS) ×3 IMPLANT
TROCAR XCEL NON-BLD 5MMX100MML (ENDOMECHANICALS) ×3 IMPLANT
TUBE CONNECTING 12X1/4 (SUCTIONS) IMPLANT
TUBING INSUFFLATION (TUBING) ×3 IMPLANT
WATER STERILE IRR 1000ML POUR (IV SOLUTION) ×3 IMPLANT
YANKAUER SUCT BULB TIP NO VENT (SUCTIONS) IMPLANT

## 2014-07-07 NOTE — Progress Notes (Signed)
Called Dr.Ossey for sign out  

## 2014-07-07 NOTE — Progress Notes (Signed)
[facility] PROGRESS NOTE  Martin [name] WUJ:[id number] DOB: [birth year] DOA: 07/06/2014 PCP: Crawford GivensGraham Duncan, MD  Assessment/Plan: 1. Acute appendicitis with perforation 1. Leukocytosis worsened despite abx overnight 2. Surgery following and pt is now s/p laproscopic appendectomy 3. Pt currently remains of rocephin and flagyl 2. Von Willebrand's dz 1. Stable 2. Plts within normal limits 3. HTN 1. Bp stable  4. HLD 5. GERD 1. On PPI 6. Dvt prophylaxis 1. SCD's  Code Status: Full Family Communication: Pt in room (indicate person spoken with, relationship, and if by phone, the number) Disposition Plan: Pending   Consultants:  General Surgery  Procedures:  Laproscopic appendectomy 11/16  Antibiotics:  Flagyl 11/15>>>  Rocephin 11/15>>>  HPI/Subjective: Complained of increased pain this AM.   Objective: Filed Vitals:   07/07/14 1219 07/07/14 1229 07/07/14 1255 07/07/14 1700  BP: 145/64  133/60 137/69  Pulse: 96  91 93  Temp:  97.2 F (36.2 C) 98.7 F (37.1 C) 98 F (36.7 C)  TempSrc:   Oral Oral  Resp: 16  16 16   Height:      Weight:      SpO2: 93%  95% 97%    Intake/Output Summary (Last 24 hours) at 07/07/14 1831 Last data filed at 07/07/14 1819  Gross per 24 hour  Intake 2999.33 ml  Output    805 ml  Net 2194.33 ml   Filed Weights   07/06/14 1723 07/06/14 2312  Weight: 72.576 kg (160 lb) 79.379 kg (175 lb)    Exam:   General:  Awake in nad  Cardiovascular: regular, s1, s2  Respiratory: normal resp effort, no wheezing  Abdomen: soft, pos bs, marked tenderness over RLQ  Musculoskeletal: perfused, no clubbing   Data Reviewed: Basic Metabolic Panel:  Recent Labs Lab 07/06/14 1730 07/07/14 0533  NA 138 134*  K 3.9 5.5*  CL 98 99  CO2 26 22  GLUCOSE 105* 127*  BUN 20 23  CREATININE 1.19 1.62*  CALCIUM 9.4 8.5   Liver Function Tests:  Recent Labs Lab 07/06/14 1730 07/07/14 0533  AST 19 17  ALT 10 9  ALKPHOS 112 80   BILITOT 0.3 0.6  PROT 7.5 6.7  ALBUMIN 3.9 3.1*   No results for input(s): LIPASE, AMYLASE in the last 168 hours. No results for input(s): AMMONIA in the last 168 hours. CBC:  Recent Labs Lab 07/06/14 1730 07/07/14 0533  WBC 15.7* 23.9*  NEUTROABS 12.3*  --   HGB 13.5 12.2*  HCT 41.6 38.3*  MCV 88.1 88.7  PLT 283 236   Cardiac Enzymes: No results for input(s): CKTOTAL, CKMB, CKMBINDEX, TROPONINI in the last 168 hours. BNP (last 3 results) No results for input(s): PROBNP in the last 8760 hours. CBG: No results for input(s): GLUCAP in the last 168 hours.  Recent Results (from the past 240 hour(s))  Surgical pcr screen     Status: None   Collection Time: 07/07/14 12:52 AM  Result Value Ref Range Status   MRSA, PCR NEGATIVE NEGATIVE Final   Staphylococcus aureus NEGATIVE NEGATIVE Final    Comment:        The Xpert SA Assay (FDA approved for NASAL specimens in patients over 78 years of age), is one component of a comprehensive surveillance program.  Test performance has been validated by [facility] for patients greater than or equal to 78 year old. It is not intended to diagnose infection nor to guide or monitor treatment.      Studies: Ct Abdomen Pelvis  W Contrast  07/06/2014   CLINICAL DATA:  Right-sided flank pain for 3 days  EXAM: CT ABDOMEN AND PELVIS WITH CONTRAST  TECHNIQUE: Multidetector CT imaging of the abdomen and pelvis was performed using the standard protocol following bolus administration of intravenous contrast.  CONTRAST:  100mL OMNIPAQUE IOHEXOL 300 MG/ML  SOLN  COMPARISON:  None.  FINDINGS: Lung bases are free of acute infiltrate or sizable effusion. A small hiatal hernia is noted which appears to be sliding-type in nature.  The liver, gallbladder, spleen, adrenal glands and pancreas are all normal in their CT appearance. Small calcified granulomas are noted within the spleen. Small duodenal diverticulum is noted adjacent to the head of the  pancreas.  The kidneys are well visualized and demonstrate a normal enhancement pattern. A small cyst is noted in the lower pole of the right kidney. No calculi or obstructive changes are seen.  The appendix is dilated with significant wall thickening and periappendiceal inflammatory changes consistent with acute appendicitis. Scattered diverticular change is noted without evidence of diverticulitis. The bladder is well distended. No pelvic mass lesion is seen.  IMPRESSION: Findings consistent with acute appendicitis.  Chronic changes as described above.  These results were called by telephone at the time of interpretation on 07/06/2014 at 8:10 pm to Dr. Gwyneth SproutWHITNEY PLUNKETT , who verbally acknowledged these results.   Electronically Signed   By: Alcide CleverMark  Lukens M.D.   On: 07/06/2014 20:10   Dg Chest Port 1 View  07/07/2014   CLINICAL DATA:  Short of breath and wheezing  EXAM: PORTABLE CHEST - 1 VIEW  COMPARISON:  12/12/2013  FINDINGS: Normal mediastinum and cardiac silhouette. A small focus of nodular airspace disease in the right lower lobe. No pleural fluid. No pneumothorax.  IMPRESSION: Small focus of airspace disease in the right lower lobe is concerning for early pneumonia.   Electronically Signed   By: Genevive BiStewart  Edmunds M.D.   On: 07/07/2014 07:20    Scheduled Meds: . cefTRIAXone (ROCEPHIN)  IV  1 g Intravenous Q12H  . [START ON 07/08/2014] desmopressin (DDAVP) IV  20 mcg Intravenous Daily  . diltiazem  240 mg Oral Daily  . [START ON 07/08/2014] enoxaparin (LOVENOX) injection  30 mg Subcutaneous Q24H  . furosemide  40 mg Oral Daily  . gabapentin  300 mg Oral BID  . gemfibrozil  600 mg Oral BID AC  . HYDROmorphone      . loratadine  10 mg Oral Daily  . metoprolol succinate  25 mg Oral QHS  . metronidazole  500 mg Intravenous Q8H  . pantoprazole (PROTONIX) IV  40 mg Intravenous QHS  . potassium chloride SA  20 mEq Oral Daily   Continuous Infusions: . sodium chloride 100 mL/hr at 07/07/14 [id number]     Principal Problem:   Appendicitis, acute Active Problems:   Hypercholesteremia   Von Willebrand's disease   Essential hypertension   GERD (gastroesophageal reflux disease)  Time spent: 35min  CHIU, STEPHEN K  [facility] Pager [phone]. If 7PM-7AM, please contact night-coverage at [email or web address], password [email or web address] 07/07/2014, 6:31 PM  LOS: 1 day

## 2014-07-07 NOTE — Anesthesia Postprocedure Evaluation (Signed)
Anesthesia Post Note  Patient: Martin [name]  Procedure(s) Performed: Procedure(s) (LRB): APPENDECTOMY LAPAROSCOPIC (N/A)  Anesthesia type: general  Patient location: PACU  Post pain: Pain level controlled  Post assessment: Patient's Cardiovascular Status Stable  Last Vitals:  Filed Vitals:   07/07/14 1255  BP: 133/60  Pulse: 91  Temp: 37.1 C  Resp: 16    Post vital signs: Reviewed and stable  Level of consciousness: sedated  Complications: No apparent anesthesia complications

## 2014-07-07 NOTE — Progress Notes (Signed)
Patient ID: [name], male   DOB: 1926-02-06, 80 y.o.   MRN: [id number]    Subjective: Pt looks ok this morning, but states he is still having bad lower abdominal pain.  Isn't better than last night.  Unable to void overnight.  Had to be cath and only got 330cc.    Objective: Vital signs in last 24 hours: Temp:  [98.6 F (37 C)-98.9 F (37.2 C)] 98.9 F (37.2 C) (11/16 0508) Pulse Rate:  [67-100] 100 (11/16 0508) Resp:  [12-21] 17 (11/16 0508) BP: (121-153)/(57-84) 128/58 mmHg (11/16 0508) SpO2:  [90 %-98 %] 96 % (11/16 0508) Weight:  [160 lb (72.576 kg)-175 lb (79.379 kg)] 175 lb (79.379 kg) (11/15 2312) Last BM Date: 07/06/14  Intake/Output from previous day: 11/15 0701 - 11/16 0700 In: 1619.3 [P.O.:120; I.V.:1399.3; IV Piggyback:100] Out: 330 [Urine:330] Intake/Output this shift:    PE: Heart: regular Lungs: CTAB Abd: soft, but distended, focally very tender in RLQ with focal peritonitis.  Patient hollers when I palpate in this area.  Hypoactive BS  Lab Results:   Recent Labs  07/06/14 1730 07/07/14 0533  WBC 15.7* 23.9*  HGB 13.5 12.2*  HCT 41.6 38.3*  PLT 283 236   BMET  Recent Labs  07/06/14 1730 07/07/14 0533  NA 138 134*  K 3.9 5.5*  CL 98 99  CO2 26 22  GLUCOSE 105* 127*  BUN 20 23  CREATININE 1.19 1.62*  CALCIUM 9.4 8.5   PT/INR  Recent Labs  07/07/14 0533  LABPROT 15.8*  INR 1.25   CMP     Component Value Date/Time   NA 134* 07/07/2014 0533   K 5.5* 07/07/2014 0533   CL 99 07/07/2014 0533   CO2 22 07/07/2014 0533   GLUCOSE 127* 07/07/2014 0533   BUN 23 07/07/2014 0533   CREATININE 1.62* 07/07/2014 0533   CALCIUM 8.5 07/07/2014 0533   PROT 6.7 07/07/2014 0533   ALBUMIN 3.1* 07/07/2014 0533   AST 17 07/07/2014 0533   ALT 9 07/07/2014 0533   ALKPHOS 80 07/07/2014 0533   BILITOT 0.6 07/07/2014 0533   GFRNONAA 37* 07/07/2014 0533   GFRAA 43* 07/07/2014 0533   Lipase  No results found for: LIPASE     Studies/Results: Ct  Abdomen Pelvis W Contrast  07/06/2014   CLINICAL DATA:  Right-sided flank pain for 3 days  EXAM: CT ABDOMEN AND PELVIS WITH CONTRAST  TECHNIQUE: Multidetector CT imaging of the abdomen and pelvis was performed using the standard protocol following bolus administration of intravenous contrast.  CONTRAST:  100mL OMNIPAQUE IOHEXOL 300 MG/ML  SOLN  COMPARISON:  None.  FINDINGS: Lung bases are free of acute infiltrate or sizable effusion. A small hiatal hernia is noted which appears to be sliding-type in nature.  The liver, gallbladder, spleen, adrenal glands and pancreas are all normal in their CT appearance. Small calcified granulomas are noted within the spleen. Small duodenal diverticulum is noted adjacent to the head of the pancreas.  The kidneys are well visualized and demonstrate a normal enhancement pattern. A small cyst is noted in the lower pole of the right kidney. No calculi or obstructive changes are seen.  The appendix is dilated with significant wall thickening and periappendiceal inflammatory changes consistent with acute appendicitis. Scattered diverticular change is noted without evidence of diverticulitis. The bladder is well distended. No pelvic mass lesion is seen.  IMPRESSION: Findings consistent with acute appendicitis.  Chronic changes as described above.  These results were called by telephone  at the time of interpretation on 07/06/2014 at 8:10 pm to Dr. Gwyneth SproutWHITNEY PLUNKETT , who verbally acknowledged these results.   Electronically Signed   By: Alcide CleverMark  Lukens M.D.   On: 07/06/2014 20:10   Dg Chest Port 1 View  07/07/2014   CLINICAL DATA:  Short of breath and wheezing  EXAM: PORTABLE CHEST - 1 VIEW  COMPARISON:  12/12/2013  FINDINGS: Normal mediastinum and cardiac silhouette. A small focus of nodular airspace disease in the right lower lobe. No pleural fluid. No pneumothorax.  IMPRESSION: Small focus of airspace disease in the right lower lobe is concerning for early pneumonia.   Electronically  Signed   By: Genevive BiStewart  Edmunds M.D.   On: 07/07/2014 07:20    Anti-infectives: Anti-infectives    Start     Dose/Rate Route Frequency Ordered Stop   07/07/14 1000  cefTRIAXone (ROCEPHIN) 1 g in dextrose 5 % 50 mL IVPB - Premix     1 g100 mL/hr over 30 Minutes Intravenous Every 12 hours 07/06/14 2332     07/07/14 0600  metroNIDAZOLE (FLAGYL) IVPB 500 mg     500 mg100 mL/hr over 60 Minutes Intravenous Every 8 hours 07/06/14 2332     07/06/14 2100  cefTRIAXone (ROCEPHIN) 1 g in dextrose 5 % 50 mL IVPB     1 g100 mL/hr over 30 Minutes Intravenous  Once 07/06/14 2054 07/06/14 2141   07/06/14 2100  metroNIDAZOLE (FLAGYL) IVPB 500 mg     500 mg100 mL/hr over 60 Minutes Intravenous  Once 07/06/14 2054 07/06/14 2253       Assessment/Plan  1. Acute appendicitis 2. ARI, decrease UOP 3. Leukocytosis, worsening 4. Von Willebrand's disease  Plan: 1. Given the increase in the patient's WBC and his focal peritonitis, we have decided to proceed with surgical intervention.  I have spoke to Dr. Cyndie ChimeGranfortuna on the phone for DDAVP recommendations.  He will get 20mcg on call to the OR and then he will get one dose for the next 2 days after surgery. 2. I have d/w the patient that we will try to removal his appendix laparoscopically, but he may require an open incision and bowel resection.  I tried to call his wife, but I got no answer.  The patient thinks she is on her way here. 3. Cont rocephin and flagyl 4. Hold lovenox today for OR  5. Cont NPO  6. Medicine is going to bladder scan patient and adjust IVFs to try and help urinary situation.  LOS: 1 day    Chrissa Meetze E 07/07/2014, 9:04 AM Pager: [phone]

## 2014-07-07 NOTE — Transfer of Care (Signed)
Immediate Anesthesia Transfer of Care Note  Patient: [name]  Procedure(s) Performed: Procedure(s): APPENDECTOMY LAPAROSCOPIC (N/A)  Patient Location: PACU  Anesthesia Type:General  Level of Consciousness: awake, alert  and oriented  Airway & Oxygen Therapy: Patient Spontanous Breathing and Patient connected to face mask oxygen  Post-op Assessment: Report given to PACU RN  Post vital signs: Reviewed and stable  Complications: No apparent anesthesia complications

## 2014-07-07 NOTE — Op Note (Signed)
Appendectomy, Lap, Procedure Note  Indications: The patient presented with a history of right-sided abdominal pain. A CT scan revealed findings consistent with acute appendicitis.  We attempted conservative management with antibiotics, but his clinical condition has worsened.  We recommended laparoscopic appendectomy.  Pre-operative Diagnosis: Acute appendicitis  Post-operative Diagnosis: perforated appendicitis  Surgeon: Carolynne Schuchard K.   Assistants: none  Anesthesia: General endotracheal anesthesia  ASA Class: 2  Procedure Details  The patient was seen again in the Holding Room. The risks, benefits, complications, treatment options, and expected outcomes were discussed with the patient and/or family. The possibilities of reaction to medication, perforation of viscus, bleeding, recurrent infection, finding a normal appendix, the need for additional procedures, failure to diagnose a condition, and creating a complication requiring transfusion or operation were discussed. There was concurrence with the proposed plan and informed consent was obtained. The site of surgery was properly noted. The patient was taken to Operating Room, identified as [name] and the procedure verified as Appendectomy. A Time Out was held and the above information confirmed.  The patient was placed in the supine position and general anesthesia was induced.  The abdomen was prepped and draped in a sterile fashion. A one centimeter supraumbilical incision was made.  He has a small umbilical hernia which was opened slightly.   The fascia was incised vertically.  We entered the peritoneal cavity bluntly.  A pursestring suture was passed around the incision with a 0 Vicryl.  The Hasson cannula was introduced into the abdomen and the tails of the suture were used to hold the Hasson in place.   The pneumoperitoneum was then established maintaining a maximum pressure of 15 mmHg.  Additional 5 mm cannulas then placed in the  left lower quadrant of the abdomen and the right upper quadrant under direct visualization. A careful evaluation of the entire abdomen was carried out. The patient was placed in Trendelenburg and left lateral decubitus position.  The scope was moved to the right upper quadrant port site. The cecum was mobilized medially.  The appendix was identified and there was signs of perforation.  Purulence was noted and there were small bits of stool in the area of the appendix.  We suctioned the purulence and contamination.   This all seemed contained to the area around the appendix. The appendix was carefully dissected. The appendix was was skeletonized with the harmonic scalpel.   The appendix was divided at its base using an endo-GIA stapler. Minimal appendiceal stump was left in place. There was no evidence of bleeding, leakage, or complication after division of the appendix. Irrigation was also performed and irrigate suctioned from the abdomen as well.  The umbilical port site was closed with the purse string suture. There was no residual palpable fascial defect.  The trocar site skin wounds were closed with 4-0 Monocryl.  Instrument, sponge, and needle counts were correct at the conclusion of the case.   Findings: The appendix was found to be inflamed. There were signs of necrosis.  There was perforation. There was abscess formation.  Estimated Blood Loss:  less than 50 mL         Drains: none         Specimens: appendix         Complications:  None; patient tolerated the procedure well.         Disposition: PACU - hemodynamically stable.         Condition: stable   Martin ArmsMatthew K. Cinderella Christoffersen, MD, FACS  [facility]  General/ Trauma Surgery  07/07/2014 11:54 AM

## 2014-07-07 NOTE — Anesthesia Preprocedure Evaluation (Addendum)
Anesthesia Evaluation  Patient identified by MRN, date of birth, ID band Patient awake    Reviewed: Allergy & Precautions, H&P , NPO status , Patient's Chart, lab work & pertinent test results, reviewed documented beta blocker date and time   Airway Mallampati: II  TM Distance: >3 FB Neck ROM: Full    Dental  (+) Edentulous Upper, Edentulous Lower   Pulmonary former smoker,    Pulmonary exam normal       Cardiovascular hypertension, Pt. on medications     Neuro/Psych    GI/Hepatic PUD, GERD-  Medicated and Controlled,  Endo/Other    Renal/GU      Musculoskeletal   Abdominal Normal abdominal exam  (+)   Peds  Hematology   Anesthesia Other Findings Von WBs Dz, bruises easily, bleeds freely per pts wife.  Pt is HOH.    Reproductive/Obstetrics                           Anesthesia Physical Anesthesia Plan  ASA: III  Anesthesia Plan: General   Post-op Pain Management:    Induction: Intravenous, Rapid sequence and Cricoid pressure planned  Airway Management Planned: Oral ETT  Additional Equipment:   Intra-op Plan:   Post-operative Plan: Extubation in OR  Informed Consent: I have reviewed the patients History and Physical, chart, labs and discussed the procedure including the risks, benefits and alternatives for the proposed anesthesia with the patient or authorized representative who has indicated his/her understanding and acceptance.   Dental advisory given  Plan Discussed with: CRNA and Surgeon  Anesthesia Plan Comments:        Anesthesia Quick Evaluation

## 2014-07-08 ENCOUNTER — Encounter (HOSPITAL_COMMUNITY): Payer: Self-pay | Admitting: Surgery

## 2014-07-08 LAB — BASIC METABOLIC PANEL
Anion gap: 12 (ref 5–15)
BUN: 23 mg/dL (ref 6–23)
CO2: 22 mEq/L (ref 19–32)
Calcium: 8.5 mg/dL (ref 8.4–10.5)
Chloride: 101 mEq/L (ref 96–112)
Creatinine, Ser: 1.14 mg/dL (ref 0.50–1.35)
GFR calc Af Amer: 65 mL/min — ABNORMAL LOW (ref >90)
GFR calc non Af Amer: 56 mL/min — ABNORMAL LOW (ref >90)
Glucose, Bld: 122 mg/dL — ABNORMAL HIGH (ref 70–99)
Potassium: 4.4 mEq/L (ref 3.7–5.3)
Sodium: 135 mEq/L — ABNORMAL LOW (ref 137–147)

## 2014-07-08 LAB — CBC
HCT: 33.2 % — ABNORMAL LOW (ref 39.0–52.0)
Hemoglobin: 10.4 g/dL — ABNORMAL LOW (ref 13.0–17.0)
MCH: 27.4 pg (ref 26.0–34.0)
MCHC: 31.3 g/dL (ref 30.0–36.0)
MCV: 87.4 fL (ref 78.0–100.0)
Platelets: 215 10*3/uL (ref 150–400)
RBC: 3.8 MIL/uL — ABNORMAL LOW (ref 4.22–5.81)
RDW: 13.6 % (ref 11.5–15.5)
WBC: 17.7 10*3/uL — ABNORMAL HIGH (ref 4.0–10.5)

## 2014-07-08 LAB — GLUCOSE, CAPILLARY: Glucose-Capillary: 155 mg/dL — ABNORMAL HIGH (ref 70–99)

## 2014-07-08 MED ORDER — ALBUTEROL SULFATE (2.5 MG/3ML) 0.083% IN NEBU
2.5000 mg | INHALATION_SOLUTION | Freq: Four times a day (QID) | RESPIRATORY_TRACT | Status: DC | PRN
  Administered 2014-07-08 – 2014-07-09 (×2): 2.5 mg via RESPIRATORY_TRACT
  Filled 2014-07-08 (×2): qty 3

## 2014-07-08 NOTE — Progress Notes (Signed)
[facility] PROGRESS NOTE  Marisue IvanJohn P Rockford RUE:[id number] DOB: 1926/08/01 DOA: 07/06/2014 PCP: Crawford GivensGraham Duncan, MD  Assessment/Plan: 1. Acute appendicitis with perforation 1. Leukocytosis now improving s/p laparoscopic appendectomy 2. Pt currently remains of rocephin and flagyl 3. Surgery recs to continue abx until WBC normalized 2. Von Willebrand's dz 1. Stable 2. Plts within normal limits 3. HTN 1. Bp stable  4. HLD 5. GERD 1. On PPI 6. Dvt prophylaxis 1. SCD's  Code Status: Full Family Communication: Pt in room Disposition Plan: Pending   Consultants:  General Surgery  Procedures:  Laproscopic appendectomy 11/16  Antibiotics:  Flagyl 11/15>>>  Rocephin 11/15>>>  HPI/Subjective: Feels better today. No acute events noted overnight  Objective: Filed Vitals:   07/08/14 0156 07/08/14 0502 07/08/14 1053 07/08/14 1432  BP: 140/61 155/74 150/81 163/64  Pulse: 88 75 77 84  Temp: 98.3 F (36.8 C) 98.2 F (36.8 C) 98.3 F (36.8 C) 98.2 F (36.8 C)  TempSrc: Oral Oral Oral Oral  Resp: 18 19 18 18   Height:      Weight:      SpO2: 98% 97% 100% 96%    Intake/Output Summary (Last 24 hours) at 07/08/14 1726 Last data filed at 07/08/14 1433  Gross per 24 hour  Intake   1660 ml  Output   1350 ml  Net    310 ml   Filed Weights   07/06/14 1723 07/06/14 2312  Weight: 72.576 kg (160 lb) 79.379 kg (175 lb)    Exam:   General:  Awake in nad  Cardiovascular: regular, s1, s2  Respiratory: normal resp effort, no wheezing  Abdomen: soft, pos bs, abd with less tenderness  Musculoskeletal: perfused, no clubbing   Data Reviewed: Basic Metabolic Panel:  Recent Labs Lab 07/06/14 1730 07/07/14 0533 07/08/14 0451  NA 138 134* 135*  K 3.9 5.5* 4.4  CL 98 99 101  CO2 26 22 22   GLUCOSE 105* 127* 122*  BUN 20 23 23   CREATININE 1.19 1.62* 1.14  CALCIUM 9.4 8.5 8.5   Liver Function Tests:  Recent Labs Lab 07/06/14 1730 07/07/14 0533  AST 19 17   ALT 10 9  ALKPHOS 112 80  BILITOT 0.3 0.6  PROT 7.5 6.7  ALBUMIN 3.9 3.1*   No results for input(s): LIPASE, AMYLASE in the last 168 hours. No results for input(s): AMMONIA in the last 168 hours. CBC:  Recent Labs Lab 07/06/14 1730 07/07/14 0533 07/08/14 0451  WBC 15.7* 23.9* 17.7*  NEUTROABS 12.3*  --   --   HGB 13.5 12.2* 10.4*  HCT 41.6 38.3* 33.2*  MCV 88.1 88.7 87.4  PLT 283 236 215   Cardiac Enzymes: No results for input(s): CKTOTAL, CKMB, CKMBINDEX, TROPONINI in the last 168 hours. BNP (last 3 results) No results for input(s): PROBNP in the last 8760 hours. CBG:  Recent Labs Lab 07/08/14 1644  GLUCAP 155*    Recent Results (from the past 240 hour(s))  Surgical pcr screen     Status: None   Collection Time: 07/07/14 12:52 AM  Result Value Ref Range Status   MRSA, PCR NEGATIVE NEGATIVE Final   Staphylococcus aureus NEGATIVE NEGATIVE Final    Comment:        The Xpert SA Assay (FDA approved for NASAL specimens in patients over 78 years of age), is one component of a comprehensive surveillance program.  Test performance has been validated by [facility] for patients greater than or equal to 78 year old. It is not intended  to diagnose infection nor to guide or monitor treatment.      Studies: Ct Abdomen Pelvis W Contrast  07/06/2014   CLINICAL DATA:  Right-sided flank pain for 3 days  EXAM: CT ABDOMEN AND PELVIS WITH CONTRAST  TECHNIQUE: Multidetector CT imaging of the abdomen and pelvis was performed using the standard protocol following bolus administration of intravenous contrast.  CONTRAST:  100mL OMNIPAQUE IOHEXOL 300 MG/ML  SOLN  COMPARISON:  None.  FINDINGS: Lung bases are free of acute infiltrate or sizable effusion. A small hiatal hernia is noted which appears to be sliding-type in nature.  The liver, gallbladder, spleen, adrenal glands and pancreas are all normal in their CT appearance. Small calcified granulomas are noted within the spleen.  Small duodenal diverticulum is noted adjacent to the head of the pancreas.  The kidneys are well visualized and demonstrate a normal enhancement pattern. A small cyst is noted in the lower pole of the right kidney. No calculi or obstructive changes are seen.  The appendix is dilated with significant wall thickening and periappendiceal inflammatory changes consistent with acute appendicitis. Scattered diverticular change is noted without evidence of diverticulitis. The bladder is well distended. No pelvic mass lesion is seen.  IMPRESSION: Findings consistent with acute appendicitis.  Chronic changes as described above.  These results were called by telephone at the time of interpretation on 07/06/2014 at 8:10 pm to Dr. Gwyneth SproutWHITNEY PLUNKETT , who verbally acknowledged these results.   Electronically Signed   By: Alcide CleverMark  Lukens M.D.   On: 07/06/2014 20:10   Dg Chest Port 1 View  07/07/2014   CLINICAL DATA:  Short of breath and wheezing  EXAM: PORTABLE CHEST - 1 VIEW  COMPARISON:  12/12/2013  FINDINGS: Normal mediastinum and cardiac silhouette. A small focus of nodular airspace disease in the right lower lobe. No pleural fluid. No pneumothorax.  IMPRESSION: Small focus of airspace disease in the right lower lobe is concerning for early pneumonia.   Electronically Signed   By: Genevive BiStewart  Edmunds M.D.   On: 07/07/2014 07:20    Scheduled Meds: . cefTRIAXone (ROCEPHIN)  IV  1 g Intravenous Q12H  . desmopressin (DDAVP) IV  20 mcg Intravenous Daily  . diltiazem  240 mg Oral Daily  . enoxaparin (LOVENOX) injection  30 mg Subcutaneous Q24H  . furosemide  40 mg Oral Daily  . gabapentin  300 mg Oral BID  . gemfibrozil  600 mg Oral BID AC  . loratadine  10 mg Oral Daily  . metoprolol succinate  25 mg Oral QHS  . metronidazole  500 mg Intravenous Q8H  . pantoprazole (PROTONIX) IV  40 mg Intravenous QHS  . potassium chloride SA  20 mEq Oral Daily   Continuous Infusions: . sodium chloride 100 mL/hr at 07/08/14 1503     Principal Problem:   Appendicitis, acute Active Problems:   Hypercholesteremia   Von Willebrand's disease   Essential hypertension   GERD (gastroesophageal reflux disease)  Time spent: 35min  Manal Kreutzer K  [facility] Pager [phone]. If 7PM-7AM, please contact night-coverage at [email or web address], password [email or web address] 07/08/2014, 5:26 PM  LOS: 2 days

## 2014-07-08 NOTE — Evaluation (Signed)
Physical Therapy Evaluation Patient Details Name: Martin [name] MRN: [id number] DOB: Jan 03, 1926 Today's Date: 07/08/2014   History of Present Illness  Patient is [name] 80 y/o male s/p laparoscopic appendectomy secondary to perforated appendicitis.   Clinical Impression  Patient presents with functional limitations due to deficits listed in PT problem list (see below). Pt with generalized weakness, balance deficits and impaired safety awareness impacting safe mobility. Pt's balance worsened with use of RW. Education on log roll technique to assist with pain control at surgical site.Tolerated stair negotiation with assist today. Pt would benefit from acute PT to improve transfers, gait, balance and mobility so pt can maximize independence and return to PLOF.    Follow Up Recommendations Home health PT;Supervision/Assistance - 24 hour    Equipment Recommendations  None recommended by PT    Recommendations for Other Services OT consult     Precautions / Restrictions Precautions Precautions: Fall Restrictions Weight Bearing Restrictions: No      Mobility  Bed Mobility Overal bed mobility: Needs Assistance Bed Mobility: Rolling;Sidelying to Sit;Sit to Supine Rolling: Supervision Sidelying to sit: Supervision;HOB elevated   Sit to supine: Supervision   General bed mobility comments: VC's for log roll technique for pain control. Use of rails for support. Supervsion for safety.  Transfers Overall transfer level: Needs assistance Equipment used: Rolling walker (2 wheeled);None Transfers: Sit to/from Stand Sit to Stand: Min guard         General transfer comment: Min guard for safety as pt impulsive with almost LOB trying to get to RW almost tripping over foley. Stood from [employer] x2.   Ambulation/Gait Ambulation/Gait assistance: Min assist Ambulation Distance (Feet): 150 Feet (+ 50') Assistive device: Rolling walker (2 wheeled);None Gait Pattern/deviations: Trunk flexed;Drifts  right/left Gait velocity: fast   General Gait Details: Pt with increased speed when using RW - unsafe and difficulty with turns, BLEs bumping into walker legs during turns. Balance improved without use of RW. Mildly unsteady reaching for walls/furniture for support. Wheezing noted during gait. Sa02 dropped to 88% post ambulation bout on RA however quickly resolved to >93%.   Stairs Stairs: Yes Stairs assistance: Min guard Stair Management: One rail Right;Alternating pattern Number of Stairs: 2 General stair comments: Min guard for balance/safety. Limited by lines.  Wheelchair Mobility    Modified Rankin (Stroke Patients Only)       Balance Overall balance assessment: Needs assistance Sitting-balance support: Feet supported;No upper extremity supported Sitting balance-Leahy Scale: Good     Standing balance support: During functional activity Standing balance-Leahy Scale: Fair Standing balance comment: Able to tolerate static standing without UE support without difficulty. Mild unsteadness during dynamic standing requiring holding onto wall/counter for support.                             Pertinent Vitals/Pain Pain Assessment: No/denies pain    Home Living Family/patient expects to be discharged to:: Private residence Living Arrangements: Spouse/significant other Available Help at Discharge: Family;Available 24 hours/day Type of Home: House Home Access: Stairs to enter;Ramped entrance Entrance Stairs-Rails: Right Entrance Stairs-Number of Steps: 2 Home Layout: One level Home Equipment: Walker - 2 wheels;Wheelchair - manual      Prior Function Level of Independence: Independent         Comments: Pt very active PTA- Optician, dispensingdriving lawn mower and tractor.     Hand Dominance        Extremity/Trunk Assessment   Upper Extremity Assessment: Overall WFL for  tasks assessed           Lower Extremity Assessment: Generalized weakness          Communication   Communication: No difficulties  Cognition Arousal/Alertness: Awake/alert Behavior During Therapy: WFL for tasks assessed/performed Overall Cognitive Status: Impaired/Different from baseline Area of Impairment: Orientation;Memory;Safety/judgement Orientation Level: Disoriented to;Time;Situation   Memory: Decreased short-term memory   Safety/Judgement: Decreased awareness of safety     General Comments: Pt confused today per pt and wife report. Not able to state anniversary date even with cues or present date.    General Comments General comments (skin integrity, edema, etc.): Left pt on RA post PT evaluation as pt's Sa02 remained >93%. RN notified.    Exercises        Assessment/Plan    PT Assessment Patient needs continued PT services  PT Diagnosis Generalized weakness;Difficulty walking   PT Problem List Decreased strength;Decreased cognition;Decreased activity tolerance;Decreased knowledge of use of DME;Decreased safety awareness;Decreased balance;Decreased mobility  PT Treatment Interventions Balance training;Gait training;Stair training;Patient/family education;Functional mobility training;Therapeutic activities;Therapeutic exercise;DME instruction;Cognitive remediation   PT Goals (Current goals can be found in the Care Plan section) Acute Rehab PT Goals Patient Stated Goal: to get back home to my yard PT Goal Formulation: With patient Time For Goal Achievement: 07/22/14 Potential to Achieve Goals: Good    Frequency Min 3X/week   Barriers to discharge        Co-evaluation               End of Session Equipment Utilized During Treatment: Gait belt Activity Tolerance: Patient tolerated treatment well;Patient limited by fatigue Patient left: in bed;with call bell/phone within reach;with bed alarm set;with family/visitor present Nurse Communication: Mobility status         Time: 1346-1415 PT Time Calculation (min) (ACUTE ONLY): 29  min   Charges:   PT Evaluation $Initial PT Evaluation Tier I: 1 Procedure PT Treatments $Gait Training: 8-22 mins   PT G CodesAlvie Heidelberg:          [name], Martin [name] 07/08/2014, 2:32 PM Martin [name], PT, DPT [phone]

## 2014-07-08 NOTE — Plan of Care (Signed)
Problem: Phase I Progression Outcomes Goal: Pain controlled with appropriate interventions Outcome: Completed/Met Date Met:  07/08/14 Goal: Voiding-avoid urinary catheter unless indicated Outcome: Completed/Met Date Met:  07/08/14 Goal: Hemodynamically stable Outcome: Completed/Met Date Met:  07/08/14  Problem: Phase II Progression Outcomes Goal: Vital signs remain stable Outcome: Completed/Met Date Met:  07/08/14  Problem: Phase III Progression Outcomes Goal: Pain controlled on oral analgesia Outcome: Completed/Met Date Met:  07/08/14     

## 2014-07-08 NOTE — Care Management Note (Signed)
  Page 1 of 1   07/14/2014     11:33:54 AM CARE MANAGEMENT NOTE 07/14/2014  Patient:  [name],[name]   Account Number:  [id number]  Date Initiated:  07/08/2014  Documentation initiated by:  [name],[name]  Subjective/Objective Assessment:     Action/Plan:   Anticipated DC Date:  07/15/2014   Anticipated DC Plan:  HOME/SELF CARE         Choice offered to / List presented to:             Status of service:   Medicare Important Message given?  YES (If response is "NO", the following Medicare IM given date fields will be blank) Date Medicare IM given:  07/14/2014 Medicare IM given by:  [name],[name] Date Additional Medicare IM given:  07/11/2014 Additional Medicare IM given by:  Martin FlurryHEATHER Bettie [name]  Discharge Disposition:  HOME/SELF CARE  Per UR Regulation:  Reviewed for med. necessity/level of care/duration of stay  If discussed at Long Length of Stay Meetings, dates discussed:    Comments:

## 2014-07-08 NOTE — Progress Notes (Signed)
Patient ID: Martin [name], male   DOB: Apr 14, 1926, 80 y.o.   MRN: [id number] 1 Day Post-Op  Subjective: Pt feels much better today, very chatty.  Ate all of his breakfast this morning.  Objective: Vital signs in last 24 hours: Temp:  [97.2 F (36.2 C)-98.7 F (37.1 C)] 98.3 F (36.8 C) (11/17 1053) Pulse Rate:  [75-104] 77 (11/17 1053) Resp:  [16-20] 18 (11/17 1053) BP: (133-172)/(60-81) 150/81 mmHg (11/17 1053) SpO2:  [93 %-100 %] 100 % (11/17 1053) Last BM Date: 07/06/14  Intake/Output from previous day: 11/16 0701 - 11/17 0700 In: 1960 [P.O.:1060; I.V.:900] Out: 1050 [Urine:1025; Blood:25] Intake/Output this shift:    PE: Abd: soft, +BS, much less tender, incisions c/d/i GU: foley in place with good UOP  Lab Results:   Recent Labs  07/07/14 0533 07/08/14 0451  WBC 23.9* 17.7*  HGB 12.2* 10.4*  HCT 38.3* 33.2*  PLT 236 215   BMET  Recent Labs  07/07/14 0533 07/08/14 0451  NA 134* 135*  K 5.5* 4.4  CL 99 101  CO2 22 22  GLUCOSE 127* 122*  BUN 23 23  CREATININE 1.62* 1.14  CALCIUM 8.5 8.5   PT/INR  Recent Labs  07/07/14 0533  LABPROT 15.8*  INR 1.25   CMP     Component Value Date/Time   NA 135* 07/08/2014 0451   K 4.4 07/08/2014 0451   CL 101 07/08/2014 0451   CO2 22 07/08/2014 0451   GLUCOSE 122* 07/08/2014 0451   BUN 23 07/08/2014 0451   CREATININE 1.14 07/08/2014 0451   CALCIUM 8.5 07/08/2014 0451   PROT 6.7 07/07/2014 0533   ALBUMIN 3.1* 07/07/2014 0533   AST 17 07/07/2014 0533   ALT 9 07/07/2014 0533   ALKPHOS 80 07/07/2014 0533   BILITOT 0.6 07/07/2014 0533   GFRNONAA 56* 07/08/2014 0451   GFRAA 65* 07/08/2014 0451   Lipase  No results found for: LIPASE     Studies/Results: Ct Abdomen Pelvis W Contrast  07/06/2014   CLINICAL DATA:  Right-sided flank pain for 3 days  EXAM: CT ABDOMEN AND PELVIS WITH CONTRAST  TECHNIQUE: Multidetector CT imaging of the abdomen and pelvis was performed using the standard protocol following  bolus administration of intravenous contrast.  CONTRAST:  100mL OMNIPAQUE IOHEXOL 300 MG/ML  SOLN  COMPARISON:  None.  FINDINGS: Lung bases are free of acute infiltrate or sizable effusion. A small hiatal hernia is noted which appears to be sliding-type in nature.  The liver, gallbladder, spleen, adrenal glands and pancreas are all normal in their CT appearance. Small calcified granulomas are noted within the spleen. Small duodenal diverticulum is noted adjacent to the head of the pancreas.  The kidneys are well visualized and demonstrate a normal enhancement pattern. A small cyst is noted in the lower pole of the right kidney. No calculi or obstructive changes are seen.  The appendix is dilated with significant wall thickening and periappendiceal inflammatory changes consistent with acute appendicitis. Scattered diverticular change is noted without evidence of diverticulitis. The bladder is well distended. No pelvic mass lesion is seen.  IMPRESSION: Findings consistent with acute appendicitis.  Chronic changes as described above.  These results were called by telephone at the time of interpretation on 07/06/2014 at 8:10 pm to Dr. Gwyneth SproutWHITNEY PLUNKETT , who verbally acknowledged these results.   Electronically Signed   By: Alcide CleverMark  Lukens M.D.   On: 07/06/2014 20:10   Dg Chest Port 1 View  07/07/2014   CLINICAL DATA:  Short of breath and wheezing  EXAM: PORTABLE CHEST - 1 VIEW  COMPARISON:  12/12/2013  FINDINGS: Normal mediastinum and cardiac silhouette. A small focus of nodular airspace disease in the right lower lobe. No pleural fluid. No pneumothorax.  IMPRESSION: Small focus of airspace disease in the right lower lobe is concerning for early pneumonia.   Electronically Signed   By: Genevive BiStewart  Edmunds M.D.   On: 07/07/2014 07:20    Anti-infectives: Anti-infectives    Start     Dose/Rate Route Frequency Ordered Stop   07/07/14 1000  cefTRIAXone (ROCEPHIN) 1 g in dextrose 5 % 50 mL IVPB - Premix     1 g100 mL/hr  over 30 Minutes Intravenous Every 12 hours 07/06/14 2332     07/07/14 0600  metroNIDAZOLE (FLAGYL) IVPB 500 mg     500 mg100 mL/hr over 60 Minutes Intravenous Every 8 hours 07/06/14 2332     07/06/14 2100  cefTRIAXone (ROCEPHIN) 1 g in dextrose 5 % 50 mL IVPB     1 g100 mL/hr over 30 Minutes Intravenous  Once 07/06/14 2054 07/06/14 2141   07/06/14 2100  metroNIDAZOLE (FLAGYL) IVPB 500 mg     500 mg100 mL/hr over 60 Minutes Intravenous  Once 07/06/14 2054 07/06/14 2253       Assessment/Plan  1. POD 1, s/p lap appy for perforated appy 2. Decrease uop, improved 3. ARI, resolved 4. Leukocytosis  Plan: 1. Doing much better today.  Tolerating a solid diet.  2. PT to see today to make recommendations for home 3. Leave foley for today.  Patient scared of having to have it replaced.  Good uop 4. Cont IV abx therapy until AF and leukocytosis resolved 5. Will need at least 7 days of abx therapy total.  LOS: 2 days    Shacarra Choe E 07/08/2014, 11:21 AM Pager: [phone]

## 2014-07-09 ENCOUNTER — Inpatient Hospital Stay (HOSPITAL_COMMUNITY): Payer: Medicare HMO

## 2014-07-09 DIAGNOSIS — J441 Chronic obstructive pulmonary disease with (acute) exacerbation: Secondary | ICD-10-CM

## 2014-07-09 DIAGNOSIS — D649 Anemia, unspecified: Secondary | ICD-10-CM

## 2014-07-09 LAB — CBC
HCT: 32.7 % — ABNORMAL LOW (ref 39.0–52.0)
Hemoglobin: 10.3 g/dL — ABNORMAL LOW (ref 13.0–17.0)
MCH: 27.5 pg (ref 26.0–34.0)
MCHC: 31.5 g/dL (ref 30.0–36.0)
MCV: 87.2 fL (ref 78.0–100.0)
Platelets: 246 10*3/uL (ref 150–400)
RBC: 3.75 MIL/uL — ABNORMAL LOW (ref 4.22–5.81)
RDW: 13.6 % (ref 11.5–15.5)
WBC: 13.2 10*3/uL — ABNORMAL HIGH (ref 4.0–10.5)

## 2014-07-09 LAB — BASIC METABOLIC PANEL
Anion gap: 15 (ref 5–15)
BUN: 23 mg/dL (ref 6–23)
CO2: 20 mEq/L (ref 19–32)
Calcium: 8.1 mg/dL — ABNORMAL LOW (ref 8.4–10.5)
Chloride: 100 mEq/L (ref 96–112)
Creatinine, Ser: 1.06 mg/dL (ref 0.50–1.35)
GFR calc Af Amer: 71 mL/min — ABNORMAL LOW (ref >90)
GFR calc non Af Amer: 61 mL/min — ABNORMAL LOW (ref >90)
Glucose, Bld: 102 mg/dL — ABNORMAL HIGH (ref 70–99)
Potassium: 3.8 mEq/L (ref 3.7–5.3)
Sodium: 135 mEq/L — ABNORMAL LOW (ref 137–147)

## 2014-07-09 MED ORDER — AZITHROMYCIN 500 MG PO TABS
500.0000 mg | ORAL_TABLET | Freq: Every day | ORAL | Status: AC
  Administered 2014-07-09: 500 mg via ORAL
  Filled 2014-07-09: qty 1
  Filled 2014-07-09: qty 2

## 2014-07-09 MED ORDER — FUROSEMIDE 10 MG/ML IJ SOLN
20.0000 mg | Freq: Once | INTRAMUSCULAR | Status: AC
  Administered 2014-07-09: 20 mg via INTRAVENOUS
  Filled 2014-07-09 (×2): qty 2

## 2014-07-09 MED ORDER — ALBUTEROL SULFATE (2.5 MG/3ML) 0.083% IN NEBU
2.5000 mg | INHALATION_SOLUTION | Freq: Four times a day (QID) | RESPIRATORY_TRACT | Status: DC
  Administered 2014-07-09: 2.5 mg via RESPIRATORY_TRACT
  Filled 2014-07-09: qty 3

## 2014-07-09 MED ORDER — GUAIFENESIN-DM 100-10 MG/5ML PO SYRP
5.0000 mL | ORAL_SOLUTION | ORAL | Status: DC | PRN
  Administered 2014-07-09 – 2014-07-11 (×2): 5 mL via ORAL
  Filled 2014-07-09 (×2): qty 5

## 2014-07-09 MED ORDER — ENOXAPARIN SODIUM 40 MG/0.4ML ~~LOC~~ SOLN
40.0000 mg | SUBCUTANEOUS | Status: DC
  Administered 2014-07-10 – 2014-07-15 (×6): 40 mg via SUBCUTANEOUS
  Filled 2014-07-09 (×7): qty 0.4

## 2014-07-09 MED ORDER — GUAIFENESIN ER 600 MG PO TB12
600.0000 mg | ORAL_TABLET | Freq: Two times a day (BID) | ORAL | Status: DC | PRN
  Administered 2014-07-09: 600 mg via ORAL
  Filled 2014-07-09: qty 1

## 2014-07-09 MED ORDER — METHYLPREDNISOLONE SODIUM SUCC 125 MG IJ SOLR
60.0000 mg | Freq: Once | INTRAMUSCULAR | Status: AC
  Administered 2014-07-09: 60 mg via INTRAVENOUS
  Filled 2014-07-09: qty 2
  Filled 2014-07-09: qty 0.96

## 2014-07-09 MED ORDER — IPRATROPIUM-ALBUTEROL 0.5-2.5 (3) MG/3ML IN SOLN
3.0000 mL | Freq: Four times a day (QID) | RESPIRATORY_TRACT | Status: DC
  Administered 2014-07-09 – 2014-07-14 (×17): 3 mL via RESPIRATORY_TRACT
  Filled 2014-07-09 (×18): qty 3

## 2014-07-09 MED ORDER — AZITHROMYCIN 250 MG PO TABS
250.0000 mg | ORAL_TABLET | Freq: Every day | ORAL | Status: DC
  Filled 2014-07-09: qty 1

## 2014-07-09 MED ORDER — MENTHOL 3 MG MT LOZG
1.0000 | LOZENGE | OROMUCOSAL | Status: DC | PRN
  Filled 2014-07-09: qty 9

## 2014-07-09 MED ORDER — IPRATROPIUM BROMIDE 0.02 % IN SOLN
0.5000 mg | Freq: Four times a day (QID) | RESPIRATORY_TRACT | Status: DC
  Administered 2014-07-09: 0.5 mg via RESPIRATORY_TRACT
  Filled 2014-07-09: qty 2.5

## 2014-07-09 MED ORDER — ALBUTEROL SULFATE (2.5 MG/3ML) 0.083% IN NEBU
2.5000 mg | INHALATION_SOLUTION | RESPIRATORY_TRACT | Status: DC | PRN
  Administered 2014-07-09: 2.5 mg via RESPIRATORY_TRACT
  Filled 2014-07-09: qty 3

## 2014-07-09 MED ORDER — PANTOPRAZOLE SODIUM 40 MG PO TBEC
40.0000 mg | DELAYED_RELEASE_TABLET | Freq: Every day | ORAL | Status: DC
  Administered 2014-07-09 – 2014-07-10 (×2): 40 mg via ORAL
  Filled 2014-07-09 (×2): qty 1

## 2014-07-09 MED ORDER — GUAIFENESIN ER 600 MG PO TB12
1200.0000 mg | ORAL_TABLET | Freq: Two times a day (BID) | ORAL | Status: DC
  Administered 2014-07-09 – 2014-07-15 (×12): 1200 mg via ORAL
  Filled 2014-07-09 (×14): qty 2

## 2014-07-09 NOTE — Progress Notes (Signed)
PT Cancellation Note  Patient Details Name: Martin [name] MRN: [id number] DOB: 09-24-1925   Cancelled Treatment:    Reason Eval/Treat Not Completed: Medical issues which prohibited therapy.  Pt with audible wheezing noted while sitting up in recliner.  Checked o2 sats and 94% on 3L/min.  Pt coughing up sputum and then vomited.  Pt reports he didn't sleep at all last night and wants to hold off on PT til tomorrow due to fatigue and not feeling well.  Pt was very pleasant, but does appear to feel ill today.  Wife in room and in agreement.  Nursing aware.  Will check on patient tomorrow.   Jessee Newnam LUBECK 07/09/2014, 11:49 AM

## 2014-07-09 NOTE — Plan of Care (Signed)
Problem: Phase I Progression Outcomes Goal: OOB as tolerated unless otherwise ordered Outcome: Completed/Met Date Met:  07/09/14  Problem: Phase II Progression Outcomes Goal: Progress activity as tolerated unless otherwise ordered Outcome: Completed/Met Date Met:  07/09/14

## 2014-07-09 NOTE — Plan of Care (Signed)
Problem: Phase I Progression Outcomes Goal: Pain controlled with appropriate interventions Outcome: Completed/Met Date Met:  07/09/14 Goal: OOB as tolerated unless otherwise ordered Outcome: Completed/Met Date Met:  07/09/14

## 2014-07-09 NOTE — Progress Notes (Signed)
Patient ID: [name], male   DOB: 12-18-1925, 80 y.o.   MRN: [id number] 2 Days Post-Op  Subjective: Pt is confused at times and wheezing this morning.  He is c/o throat pain from coughing up so much sputum.  He denies nausea.  Objective: Vital signs in last 24 hours: Temp:  [98.1 F (36.7 C)-98.6 F (37 C)] 98.6 F (37 C) (11/18 0505) Pulse Rate:  [71-84] 71 (11/18 0505) Resp:  [16-18] 16 (11/18 0505) BP: (142-173)/(56-64) 148/58 mmHg (11/18 0516) SpO2:  [92 %-100 %] 92 % (11/18 1108) Last BM Date: 07/06/14  Intake/Output from previous day: 11/17 0701 - 11/18 0700 In: 2259 [P.O.:960; I.V.:1299] Out: 1025 [Urine:1025] Intake/Output this shift: Total I/O In: 240 [P.O.:240] Out: 400 [Urine:400]  PE: Abd: soft, decreased BS, minimal distention, incisions c/d/i, less tender Lungs: obvious mild distress with audible wheezing  Lab Results:   Recent Labs  07/08/14 0451 07/09/14 0529  WBC 17.7* 13.2*  HGB 10.4* 10.3*  HCT 33.2* 32.7*  PLT 215 246   BMET  Recent Labs  07/08/14 0451 07/09/14 0529  NA 135* 135*  K 4.4 3.8  CL 101 100  CO2 22 20  GLUCOSE 122* 102*  BUN 23 23  CREATININE 1.14 1.06  CALCIUM 8.5 8.1*   PT/INR  Recent Labs  07/07/14 0533  LABPROT 15.8*  INR 1.25   CMP     Component Value Date/Time   NA 135* 07/09/2014 0529   K 3.8 07/09/2014 0529   CL 100 07/09/2014 0529   CO2 20 07/09/2014 0529   GLUCOSE 102* 07/09/2014 0529   BUN 23 07/09/2014 0529   CREATININE 1.06 07/09/2014 0529   CALCIUM 8.1* 07/09/2014 0529   PROT 6.7 07/07/2014 0533   ALBUMIN 3.1* 07/07/2014 0533   AST 17 07/07/2014 0533   ALT 9 07/07/2014 0533   ALKPHOS 80 07/07/2014 0533   BILITOT 0.6 07/07/2014 0533   GFRNONAA 61* 07/09/2014 0529   GFRAA 71* 07/09/2014 0529   Lipase  No results found for: LIPASE     Studies/Results: Dg Chest Port 1 View  07/09/2014   CLINICAL DATA:  Wheezing and cough.  EXAM: PORTABLE CHEST - 1 VIEW  COMPARISON:  07/07/2014   FINDINGS: Shallow inspiration. Heart size and pulmonary vascularity are normal for technique. Mild residual infiltrative changes in the lung bases, similar to previous study. No progression. No blunting of costophrenic angles. No pneumothorax. Calcification of the aorta.  IMPRESSION: Shallow inspiration with mild infiltration or atelectasis in the lung bases.   Electronically Signed   By: Burman NievesWilliam  Stevens M.D.   On: 07/09/2014 06:02    Anti-infectives: Anti-infectives    Start     Dose/Rate Route Frequency Ordered Stop   07/10/14 1000  azithromycin (ZITHROMAX) tablet 250 mg     250 mg Oral Daily 07/09/14 0938 07/14/14 0959   07/09/14 1000  azithromycin (ZITHROMAX) tablet 500 mg     500 mg Oral Daily 07/09/14 0938 07/09/14 1032   07/07/14 1000  cefTRIAXone (ROCEPHIN) 1 g in dextrose 5 % 50 mL IVPB - Premix     1 g100 mL/hr over 30 Minutes Intravenous Every 12 hours 07/06/14 2332     07/07/14 0600  metroNIDAZOLE (FLAGYL) IVPB 500 mg     500 mg100 mL/hr over 60 Minutes Intravenous Every 8 hours 07/06/14 2332     07/06/14 2100  cefTRIAXone (ROCEPHIN) 1 g in dextrose 5 % 50 mL IVPB     1 g100 mL/hr over 30 Minutes  Intravenous  Once 07/06/14 2054 07/06/14 2141   07/06/14 2100  metroNIDAZOLE (FLAGYL) IVPB 500 mg     500 mg100 mL/hr over 60 Minutes Intravenous  Once 07/06/14 2054 07/06/14 2253       Assessment/Plan  1. POD 2, s/p lap appy for perforated appy 2. Wheezing, bronchitis per primary 3. Von Willebrand's disease  Plan: 1. Patient should be getting last dose of DDAVP today for Von Willebrand's disease and recent surgery 2. Cont soft diet 3. Patient confused some, somewhat expected given age, surgery, and sick. 4. Treatment of pulmonary issues per primary service 5. Total of 7 days of abx therapy. Will switch to oral abx once WBC normalized and AF.   LOS: 3 days    [name] 07/09/2014, 11:31 AM Pager: [phone]

## 2014-07-09 NOTE — Progress Notes (Signed)
0502 Patient to have expiratory wheeze, respiratory treatment given. Patient has slept very little tonight coughing up large amount of thick yellow mucus. Dr. Magnus IvanBlackman notified of patient continue to wheeze and would like something for his cough orders received. 0545 Portable chest xray done. Mucinex 600mg  given for cough. Will continue to monitor.

## 2014-07-09 NOTE — Progress Notes (Signed)
PROGRESS NOTE    Martin IvanJohn P Westerman YNW:[id number] DOB: Dec 26, 1925 DOA: 07/06/2014 PCP: Crawford GivensGraham Duncan, MD  HPI/Brief narrative [id number] year old male with history of von Willebrand's disease, HTN, HLD, presented with RLQ abdominal pain and underwent laparoscopic appendectomy for perforated appendicitis on 07/07/2014. General surgery continues to follow.   Assessment/Plan:  1. Perforated appendicitis: Surgery consultation and follow-up appreciated patient is status post laparoscopic appendectomy on 11/16. Surgery continues to follow. Patient tolerating diet. Currently on IV Rocephin and Flagyl which will be switched to oral levofloxacin and Flagyl in the next 24-48 hours. 2. Acute bronchitis/?? Pneumonia: after discussing with surgical team, added azithromycin for atypical coverage to ongoing IV Rocephin. Incentive spirometry. Follow-up chest x-ray in 4-6 weeks. 3. Possible COPD exacerbation: Former smoker but no confirmed COPD. We'll give a single dose of IV Solu-Medrol. Continue antibiotics and bronchodilators. Monitor closely. 4. Von Willebrand's disease: Patient will complete a dose of DDAVP today to cover for stressful situation. 5. Essential hypertension:Controlled 6. History of hyperlipidemia 7. History of GERD: Continue PPI 8. Anemia: Stable   Code Status: full Family Communication: discussed with spouse at bedside Disposition Plan: home when medically stable   Consultants:  General surgery  Procedures:  Laparoscopic appendectomy 11/16  Antibiotics:  IV Rocephin  IV Flagyl  Oral azithromycin 11/18 >   Subjective: Productive cough and wheezing. Denies dyspnea. Throat hurts from constant coughing. Denies fever or chills. No chest pain. Tolerating diet.  Objective: Filed Vitals:   07/09/14 0516 07/09/14 1108 07/09/14 1315 07/09/14 1424  BP: 148/58  144/54   Pulse:   87   Temp:   98.9 F (37.2 C)   TempSrc:   Oral   Resp:   16   Height:      Weight:      SpO2:  92%  94% 93%    Intake/Output Summary (Last 24 hours) at 07/09/14 1445 Last data filed at 07/09/14 1402  Gross per 24 hour  Intake   2259 ml  Output    950 ml  Net   1309 ml   Filed Weights   07/06/14 1723 07/06/14 2312  Weight: 72.576 kg (160 lb) 79.379 kg (175 lb)     Exam:  General exam: pleasant elderly male sitting up on chair in no obvious distress. Respiratory system: reduced breath sounds bilaterally with scattered bilateral medium pitched expiratory rhonchi without crackles. No increased work of breathing. Cardiovascular system: S1 & S2 heard, RRR. No JVD, murmurs, gallops, clicks or pedal edema. Gastrointestinal system: Abdomen is nondistended, soft and nontender. Normal bowel sounds heard. Central nervous system: Alert and oriented. No focal neurological deficits. Extremities: Symmetric 5 x 5 power.   Data Reviewed: Basic Metabolic Panel:  Recent Labs Lab 07/06/14 1730 07/07/14 0533 07/08/14 0451 07/09/14 0529  NA 138 134* 135* 135*  K 3.9 5.5* 4.4 3.8  CL 98 99 101 100  CO2 26 22 22 20   GLUCOSE 105* 127* 122* 102*  BUN 20 23 23 23   CREATININE 1.19 1.62* 1.14 1.06  CALCIUM 9.4 8.5 8.5 8.1*   Liver Function Tests:  Recent Labs Lab 07/06/14 1730 07/07/14 0533  AST 19 17  ALT 10 9  ALKPHOS 112 80  BILITOT 0.3 0.6  PROT 7.5 6.7  ALBUMIN 3.9 3.1*   No results for input(s): LIPASE, AMYLASE in the last 168 hours. No results for input(s): AMMONIA in the last 168 hours. CBC:  Recent Labs Lab 07/06/14 1730 07/07/14 0533 07/08/14 0451 07/09/14 0529  WBC 15.7* 23.9* 17.7*  13.2*  NEUTROABS 12.3*  --   --   --   HGB 13.5 12.2* 10.4* 10.3*  HCT 41.6 38.3* 33.2* 32.7*  MCV 88.1 88.7 87.4 87.2  PLT 283 236 215 246   Cardiac Enzymes: No results for input(s): CKTOTAL, CKMB, CKMBINDEX, TROPONINI in the last 168 hours. BNP (last 3 results) No results for input(s): PROBNP in the last 8760 hours. CBG:  Recent Labs Lab 07/08/14 1644  GLUCAP 155*     Recent Results (from the past 240 hour(s))  Surgical pcr screen     Status: None   Collection Time: 07/07/14 12:52 AM  Result Value Ref Range Status   MRSA, PCR NEGATIVE NEGATIVE Final   Staphylococcus aureus NEGATIVE NEGATIVE Final    Comment:        The Xpert SA Assay (FDA approved for NASAL specimens in patients over 78 years of age), is one component of a comprehensive surveillance program.  Test performance has been validated by [facility] for patients greater than or equal to 78 year old. It is not intended to diagnose infection nor to guide or monitor treatment.           Studies: Dg Chest Port 1 View  07/09/2014   CLINICAL DATA:  Wheezing and cough.  EXAM: PORTABLE CHEST - 1 VIEW  COMPARISON:  07/07/2014  FINDINGS: Shallow inspiration. Heart size and pulmonary vascularity are normal for technique. Mild residual infiltrative changes in the lung bases, similar to previous study. No progression. No blunting of costophrenic angles. No pneumothorax. Calcification of the aorta.  IMPRESSION: Shallow inspiration with mild infiltration or atelectasis in the lung bases.   Electronically Signed   By: Burman NievesWilliam  Stevens M.D.   On: 07/09/2014 06:02        Scheduled Meds: . [START ON 07/10/2014] azithromycin  250 mg Oral Daily  . cefTRIAXone (ROCEPHIN)  IV  1 g Intravenous Q12H  . diltiazem  240 mg Oral Daily  . [START ON 07/10/2014] enoxaparin (LOVENOX) injection  40 mg Subcutaneous Q24H  . furosemide  40 mg Oral Daily  . gabapentin  300 mg Oral BID  . gemfibrozil  600 mg Oral BID AC  . guaiFENesin  1,200 mg Oral BID  . ipratropium-albuterol  3 mL Nebulization Q6H  . loratadine  10 mg Oral Daily  . metoprolol succinate  25 mg Oral QHS  . metronidazole  500 mg Intravenous Q8H  . pantoprazole  40 mg Oral QHS  . potassium chloride SA  20 mEq Oral Daily   Continuous Infusions:   Principal Problem:   Appendicitis, acute Active Problems:   Hypercholesteremia   Von  Willebrand's disease   Essential hypertension   GERD (gastroesophageal reflux disease)    Time spent: 40 minutes.    Marcellus ScottHONGALGI,Ilina Xu, MD, FACP, FHM. [facility] Pager [phone]  If 7PM-7AM, please contact night-coverage [email or web address] Password [email or web address] 07/09/2014, 2:45 PM    LOS: 3 days

## 2014-07-09 NOTE — Progress Notes (Signed)
2320 Patient has audible wheeze in upper lobes and diminish in the lower lobes of his lungs. 2322 Dr. Magnus IvanBlackman notified and order received for respiratory treatments. 2334 Respiratory therapy gave albuterol 2.5mg  via mask. 2350 Patient resting at present will continue to monitor.

## 2014-07-10 LAB — BASIC METABOLIC PANEL
Anion gap: 16 — ABNORMAL HIGH (ref 5–15)
BUN: 23 mg/dL (ref 6–23)
CO2: 22 mEq/L (ref 19–32)
Calcium: 8.4 mg/dL (ref 8.4–10.5)
Chloride: 93 mEq/L — ABNORMAL LOW (ref 96–112)
Creatinine, Ser: 0.93 mg/dL (ref 0.50–1.35)
GFR calc Af Amer: 86 mL/min — ABNORMAL LOW (ref >90)
GFR calc non Af Amer: 74 mL/min — ABNORMAL LOW (ref >90)
Glucose, Bld: 146 mg/dL — ABNORMAL HIGH (ref 70–99)
Potassium: 3.6 mEq/L — ABNORMAL LOW (ref 3.7–5.3)
Sodium: 131 mEq/L — ABNORMAL LOW (ref 137–147)

## 2014-07-10 LAB — CBC
HCT: 30 % — ABNORMAL LOW (ref 39.0–52.0)
Hemoglobin: 9.9 g/dL — ABNORMAL LOW (ref 13.0–17.0)
MCH: 27.7 pg (ref 26.0–34.0)
MCHC: 33 g/dL (ref 30.0–36.0)
MCV: 83.8 fL (ref 78.0–100.0)
Platelets: 259 10*3/uL (ref 150–400)
RBC: 3.58 MIL/uL — ABNORMAL LOW (ref 4.22–5.81)
RDW: 13.2 % (ref 11.5–15.5)
WBC: 7.8 10*3/uL (ref 4.0–10.5)

## 2014-07-10 MED ORDER — FAMOTIDINE 20 MG PO TABS
20.0000 mg | ORAL_TABLET | Freq: Two times a day (BID) | ORAL | Status: DC
  Administered 2014-07-10 – 2014-07-15 (×10): 20 mg via ORAL
  Filled 2014-07-10 (×13): qty 1

## 2014-07-10 MED ORDER — METHYLPREDNISOLONE SODIUM SUCC 125 MG IJ SOLR
60.0000 mg | Freq: Two times a day (BID) | INTRAMUSCULAR | Status: DC
  Administered 2014-07-10 – 2014-07-11 (×3): 60 mg via INTRAVENOUS
  Filled 2014-07-10: qty 2
  Filled 2014-07-10 (×4): qty 0.96

## 2014-07-10 MED ORDER — ASPIRIN EC 81 MG PO TBEC
81.0000 mg | DELAYED_RELEASE_TABLET | Freq: Every evening | ORAL | Status: DC
  Administered 2014-07-10 – 2014-07-14 (×5): 81 mg via ORAL
  Filled 2014-07-10 (×6): qty 1

## 2014-07-10 MED ORDER — LEVOFLOXACIN 750 MG PO TABS
750.0000 mg | ORAL_TABLET | Freq: Every day | ORAL | Status: DC
  Administered 2014-07-10 – 2014-07-13 (×4): 750 mg via ORAL
  Filled 2014-07-10 (×5): qty 1

## 2014-07-10 MED ORDER — FLUTICASONE PROPIONATE 50 MCG/ACT NA SUSP
2.0000 | Freq: Every day | NASAL | Status: DC | PRN
  Filled 2014-07-10: qty 16

## 2014-07-10 MED ORDER — SUCRALFATE 1 G PO TABS
1.0000 g | ORAL_TABLET | Freq: Two times a day (BID) | ORAL | Status: DC
  Administered 2014-07-10 – 2014-07-15 (×10): 1 g via ORAL
  Filled 2014-07-10 (×12): qty 1

## 2014-07-10 MED ORDER — METRONIDAZOLE 500 MG PO TABS
500.0000 mg | ORAL_TABLET | Freq: Three times a day (TID) | ORAL | Status: AC
  Administered 2014-07-10 – 2014-07-13 (×11): 500 mg via ORAL
  Filled 2014-07-10 (×14): qty 1

## 2014-07-10 MED ORDER — HYDROMORPHONE HCL 1 MG/ML IJ SOLN
0.5000 mg | INTRAMUSCULAR | Status: DC | PRN
  Administered 2014-07-10 (×3): 0.5 mg via INTRAVENOUS
  Filled 2014-07-10 (×3): qty 1

## 2014-07-10 NOTE — Progress Notes (Signed)
3 Days Post-Op  Subjective: Patient sitting up - audible wheezing, but minimal increased work of breathing Several BM yesterday Abdominal pain improved  Objective: Vital signs in last 24 hours: Temp:  [97.9 F (36.6 C)-98.9 F (37.2 C)] 98.1 F (36.7 C) (11/19 0508) Pulse Rate:  [76-87] 80 (11/19 0508) Resp:  [16-20] 18 (11/19 0508) BP: (137-144)/(54-68) 143/68 mmHg (11/19 0508) SpO2:  [92 %-98 %] 96 % (11/19 0508) Last BM Date: 07/09/14  Intake/Output from previous day: 11/18 0701 - 11/19 0700 In: 960 [P.O.:960] Out: 3250 [Urine:3250] Intake/Output this shift: Total I/O In: -  Out: 50 [Urine:50]  General appearance: alert, cooperative and no distress GI: protuberant, minimal periumbilical tenderness Dressings removed.  Incisions c/d/i.  Bruising around RUQ port site  Lab Results:   Recent Labs  07/09/14 0529 07/10/14 0522  WBC 13.2* 7.8  HGB 10.3* 9.9*  HCT 32.7* 30.0*  PLT 246 259   BMET  Recent Labs  07/09/14 0529 07/10/14 0522  NA 135* 131*  K 3.8 3.6*  CL 100 93*  CO2 20 22  GLUCOSE 102* 146*  BUN 23 23  CREATININE 1.06 0.93  CALCIUM 8.1* 8.4   PT/INR No results for input(s): LABPROT, INR in the last 72 hours. ABG No results for input(s): PHART, HCO3 in the last 72 hours.  Invalid input(s): PCO2, PO2  Studies/Results: Dg Chest Port 1 View  07/09/2014   CLINICAL DATA:  Wheezing and cough.  EXAM: PORTABLE CHEST - 1 VIEW  COMPARISON:  07/07/2014  FINDINGS: Shallow inspiration. Heart size and pulmonary vascularity are normal for technique. Mild residual infiltrative changes in the lung bases, similar to previous study. No progression. No blunting of costophrenic angles. No pneumothorax. Calcification of the aorta.  IMPRESSION: Shallow inspiration with mild infiltration or atelectasis in the lung bases.   Electronically Signed   By: Burman NievesWilliam  Stevens M.D.   On: 07/09/2014 06:02    Anti-infectives: Anti-infectives    Start     Dose/Rate Route  Frequency Ordered Stop   07/10/14 1000  azithromycin (ZITHROMAX) tablet 250 mg  Status:  Discontinued     250 mg Oral Daily 07/09/14 0938 07/10/14 0921   07/10/14 0930  metroNIDAZOLE (FLAGYL) tablet 500 mg     500 mg Oral 3 times per day 07/10/14 0921     07/09/14 1000  azithromycin (ZITHROMAX) tablet 500 mg     500 mg Oral Daily 07/09/14 0938 07/09/14 1032   07/07/14 1000  cefTRIAXone (ROCEPHIN) 1 g in dextrose 5 % 50 mL IVPB - Premix  Status:  Discontinued     1 g100 mL/hr over 30 Minutes Intravenous Every 12 hours 07/06/14 2332 07/10/14 0921   07/07/14 0600  metroNIDAZOLE (FLAGYL) IVPB 500 mg  Status:  Discontinued     500 mg100 mL/hr over 60 Minutes Intravenous Every 8 hours 07/06/14 2332 07/10/14 0921   07/06/14 2100  cefTRIAXone (ROCEPHIN) 1 g in dextrose 5 % 50 mL IVPB     1 g100 mL/hr over 30 Minutes Intravenous  Once 07/06/14 2054 07/06/14 2141   07/06/14 2100  metroNIDAZOLE (FLAGYL) IVPB 500 mg     500 mg100 mL/hr over 60 Minutes Intravenous  Once 07/06/14 2054 07/06/14 2253      Assessment/Plan: s/p Procedure(s): APPENDECTOMY LAPAROSCOPIC (N/A) Patient is doing well from a surgical standpoint.    He remains in the hospital for medical reasons OK to use steroids as needed for pulmonary status From our standpoint, needs 7 total days of antibiotics from the  day of surgery.   LOS: 4 days    Dene Landsberg K. 07/10/2014

## 2014-07-10 NOTE — Progress Notes (Signed)
Physical Therapy Treatment Patient Details Name: [name] MRN: [id number] DOB: [birth year] Today's Date: 07/10/2014    History of Present Illness Patient is a 80 y/o male s/p laparoscopic appendectomy secondary to perforated appendicitis.     PT Comments    Patient progressing well with overall mobility. Able to ambulate more safely than previous session. Encouraged ambulation with nursing staff  Follow Up Recommendations  Home health PT;Supervision/Assistance - 24 hour     Equipment Recommendations  None recommended by PT    Recommendations for Other Services       Precautions / Restrictions Precautions Precautions: Fall    Mobility  Bed Mobility Overal bed mobility: Modified Independent                Transfers Overall transfer level: Needs assistance Equipment used: Rolling walker (2 wheeled);None   Sit to Stand: Supervision         General transfer comment: Cues for safe hand placement and not to pull up on RW  Ambulation/Gait Ambulation/Gait assistance: Min guard Ambulation Distance (Feet): 400 Feet Assistive device: Rolling walker (2 wheeled) Gait Pattern/deviations: Decreased stride length;Trunk flexed   Gait velocity interpretation: at or above normal speed for age/gender General Gait Details: Patient more controlled with mobility this session. Cues for RW management and positioning. O2 monitored thorughout and >92% on RA.    Stairs            Wheelchair Mobility    Modified Rankin (Stroke Patients Only)       Balance                                    Cognition Arousal/Alertness: Awake/alert Behavior During Therapy: WFL for tasks assessed/performed Overall Cognitive Status: Impaired/Different from baseline Area of Impairment: Safety/judgement         Safety/Judgement: Decreased awareness of safety   Problem Solving: Requires verbal cues      Exercises      General Comments        Pertinent  Vitals/Pain Pain Assessment: No/denies pain    Home Living                      Prior Function            PT Goals (current goals can now be found in the care plan section) Progress towards PT goals: Progressing toward goals    Frequency  Min 3X/week    PT Plan Current plan remains appropriate    Co-evaluation             End of Session Equipment Utilized During Treatment: Gait belt Activity Tolerance: Patient tolerated treatment well Patient left: in chair;with call bell/phone within reach     Time: 0807-0833 PT Time Calculation (min) (ACUTE ONLY): 26 min  Charges:  $Gait Training: 23-37 mins                    G Codes:      Fredrich BirksRobinette, Ardis Fullwood Elizabeth 07/10/2014, 9:02 AM  07/10/2014 Fredrich Birksobinette, Standley Bargo Elizabeth PTA [phone] pager [phone] office

## 2014-07-10 NOTE — Progress Notes (Signed)
PROGRESS NOTE    [name] EXB:[id number] DOB: [birth year] DOA: 07/06/2014 PCP: Crawford GivensGraham Duncan, MD  HPI/Brief narrative 80 year old male with history of von Willebrand's disease, HTN, HLD, presented with RLQ abdominal pain and underwent laparoscopic appendectomy for perforated appendicitis on 07/07/2014. General surgery continues to follow.   Assessment/Plan:  1. Perforated appendicitis: Surgery consultation and follow-up appreciated patient is status post laparoscopic appendectomy on 11/16. Surgery continues to follow. Patient tolerating diet. After discussing with surgery, antibiotics switched to oral levofloxacin and Flagyl to complete total 7 days treatment. 2. Acute bronchitis/?? Pneumonia: Azithromycin had been added to IV Rocephin on 11/18. Now that antibiotics are being transitioned to oral, will switch to oral levofloxacin and treat for total 7 days. Follow-up chest x-ray in 4-6 weeks. 3. Possible COPD exacerbation: Former smoker but no confirmed COPD. Patient was treated with IV Solu-Medrol, antibiotics, oxygen, bronchodilator nebulizations. Clinically improving. Continue management and monitor closely. 4. Von Willebrand's disease: Patient completed 2 doses of DDAVP for stressful situation. 5. Essential hypertension:Controlled 6. History of hyperlipidemia 7. History of GERD: Continue PPI 8. Anemia: Stable   Code Status: full Family Communication: discussed with spouse at bedside Disposition Plan: home when medically stable   Consultants:  General surgery  Procedures:  Laparoscopic appendectomy 11/16  Foley catheter-DC'd  Antibiotics:  IV Rocephin-DC'd  IV Flagyl-DC'd  Oral azithromycin 11/18 > 11/19  Oral levofloxacin 11/19 >  Oral Flagyl 11/19 >  Subjective: Productive cough and wheezing have improved. Denies dyspnea or throat pain. Has had several BMs and no difficulty urinating.   Objective: Filed Vitals:   07/09/14 2156 07/10/14 0216 07/10/14  0508 07/10/14 1333  BP: 137/58  143/68 132/66  Pulse: 82  80 78  Temp: 97.9 F (36.6 C)  98.1 F (36.7 C) 98.4 F (36.9 C)  TempSrc: Oral  Oral Oral  Resp: 19  18 18   Height:      Weight:      SpO2: 97% 98% 96% 98%    Intake/Output Summary (Last 24 hours) at 07/10/14 1500 Last data filed at 07/10/14 1421  Gross per 24 hour  Intake    840 ml  Output   3350 ml  Net  -2510 ml   Filed Weights   07/06/14 1723 07/06/14 2312  Weight: 72.576 kg (160 lb) 79.379 kg (175 lb)     Exam:  General exam: pleasant elderly male sitting up on chair in no obvious distress. Respiratory system: Improved breath sounds compared to 11/18 but still has a few scattered bilateral expiratory medium pitched rhonchi but no crackles. No increased work of breathing. Cardiovascular system: S1 & S2 heard, RRR. No JVD, murmurs, gallops, clicks or pedal edema. Gastrointestinal system: Abdomen is nondistended, soft and nontender. Normal bowel sounds heard. Central nervous system: Alert and oriented. No focal neurological deficits. Extremities: Symmetric 5 x 5 power.   Data Reviewed: Basic Metabolic Panel:  Recent Labs Lab 07/06/14 1730 07/07/14 0533 07/08/14 0451 07/09/14 0529 07/10/14 0522  NA 138 134* 135* 135* 131*  K 3.9 5.5* 4.4 3.8 3.6*  CL 98 99 101 100 93*  CO2 26 22 22 20 22   GLUCOSE 105* 127* 122* 102* 146*  BUN 20 23 23 23 23   CREATININE 1.19 1.62* 1.14 1.06 0.93  CALCIUM 9.4 8.5 8.5 8.1* 8.4   Liver Function Tests:  Recent Labs Lab 07/06/14 1730 07/07/14 0533  AST 19 17  ALT 10 9  ALKPHOS 112 80  BILITOT 0.3 0.6  PROT 7.5 6.7  ALBUMIN  3.9 3.1*   No results for input(s): LIPASE, AMYLASE in the last 168 hours. No results for input(s): AMMONIA in the last 168 hours. CBC:  Recent Labs Lab 07/06/14 1730 07/07/14 0533 07/08/14 0451 07/09/14 0529 07/10/14 0522  WBC 15.7* 23.9* 17.7* 13.2* 7.8  NEUTROABS 12.3*  --   --   --   --   HGB 13.5 12.2* 10.4* 10.3* 9.9*  HCT  41.6 38.3* 33.2* 32.7* 30.0*  MCV 88.1 88.7 87.4 87.2 83.8  PLT 283 236 215 246 259   Cardiac Enzymes: No results for input(s): CKTOTAL, CKMB, CKMBINDEX, TROPONINI in the last 168 hours. BNP (last 3 results) No results for input(s): PROBNP in the last 8760 hours. CBG:  Recent Labs Lab 07/08/14 1644  GLUCAP 155*    Recent Results (from the past 240 hour(s))  Surgical pcr screen     Status: None   Collection Time: 07/07/14 12:52 AM  Result Value Ref Range Status   MRSA, PCR NEGATIVE NEGATIVE Final   Staphylococcus aureus NEGATIVE NEGATIVE Final    Comment:        The Xpert SA Assay (FDA approved for NASAL specimens in patients over 78 years of age), is one component of a comprehensive surveillance program.  Test performance has been validated by [facility] for patients greater than or equal to 78 year old. It is not intended to diagnose infection nor to guide or monitor treatment.           Studies: Dg Chest Port 1 View  07/09/2014   CLINICAL DATA:  Wheezing and cough.  EXAM: PORTABLE CHEST - 1 VIEW  COMPARISON:  07/07/2014  FINDINGS: Shallow inspiration. Heart size and pulmonary vascularity are normal for technique. Mild residual infiltrative changes in the lung bases, similar to previous study. No progression. No blunting of costophrenic angles. No pneumothorax. Calcification of the aorta.  IMPRESSION: Shallow inspiration with mild infiltration or atelectasis in the lung bases.   Electronically Signed   By: Burman NievesWilliam  Stevens M.D.   On: 07/09/2014 06:02        Scheduled Meds: . diltiazem  240 mg Oral Daily  . enoxaparin (LOVENOX) injection  40 mg Subcutaneous Q24H  . furosemide  40 mg Oral Daily  . gabapentin  300 mg Oral BID  . gemfibrozil  600 mg Oral BID AC  . guaiFENesin  1,200 mg Oral BID  . ipratropium-albuterol  3 mL Nebulization Q6H  . levofloxacin  750 mg Oral Daily  . loratadine  10 mg Oral Daily  . methylPREDNISolone (SOLU-MEDROL) injection  60  mg Intravenous Q12H  . metoprolol succinate  25 mg Oral QHS  . metroNIDAZOLE  500 mg Oral 3 times per day  . pantoprazole  40 mg Oral QHS  . potassium chloride SA  20 mEq Oral Daily   Continuous Infusions:   Principal Problem:   Appendicitis, acute Active Problems:   Hypercholesteremia   Von Willebrand's disease   Essential hypertension   GERD (gastroesophageal reflux disease)    Time spent: 40 minutes.    Marcellus ScottHONGALGI,ANAND, MD, FACP, FHM. [facility] Pager [phone]  If 7PM-7AM, please contact night-coverage [email or web address] Password [email or web address] 07/10/2014, 3:00 PM    LOS: 4 days

## 2014-07-10 NOTE — Progress Notes (Addendum)
ANTIBIOTIC CONSULT NOTE - INITIAL  Pharmacy Consult for levofloxacin Indication: COPD exacerbation vs PNA  Allergies  Allergen Reactions  . Atorvastatin     REACTION: muscles tense up   . Codeine Nausea And Vomiting  . Lisinopril Swelling    Lips and face swelling  . Statins Other (See Comments)    Muscle tense up    Patient Measurements: Height: 5\' 8"  (172.7 cm) Weight: 175 lb (79.379 kg) IBW/kg (Calculated) : 68.4  Vital Signs: Temp: 98.1 F (36.7 C) (11/19 0508) Temp Source: Oral (11/19 0508) BP: 143/68 mmHg (11/19 0508) Pulse Rate: 80 (11/19 0508) Intake/Output from previous day: 11/18 0701 - 11/19 0700 In: 960 [P.O.:960] Out: 3250 [Urine:3250] Intake/Output from this shift: Total I/O In: -  Out: 50 [Urine:50]  Labs:  Recent Labs  07/08/14 0451 07/09/14 0529 07/10/14 0522  WBC 17.7* 13.2* 7.8  HGB 10.4* 10.3* 9.9*  PLT 215 246 259  CREATININE 1.14 1.06 0.93   Estimated Creatinine Clearance: 55.2 mL/min (by C-G formula based on Cr of 0.93). No results for input(s): VANCOTROUGH, VANCOPEAK, VANCORANDOM, GENTTROUGH, GENTPEAK, GENTRANDOM, TOBRATROUGH, TOBRAPEAK, TOBRARND, AMIKACINPEAK, AMIKACINTROU, AMIKACIN in the last 72 hours.   Microbiology: Recent Results (from the past 720 hour(s))  Surgical pcr screen     Status: None   Collection Time: 07/07/14 12:52 AM  Result Value Ref Range Status   MRSA, PCR NEGATIVE NEGATIVE Final   Staphylococcus aureus NEGATIVE NEGATIVE Final    Comment:        The Xpert SA Assay (FDA approved for NASAL specimens in patients over 78 years of age), is one component of a comprehensive surveillance program.  Test performance has been validated by [facility] for patients greater than or equal to 78 year old. It is not intended to diagnose infection nor to guide or monitor treatment.     Medical History: Past Medical History  Diagnosis Date  . Barrett esophagus 09/2003  . GERD (gastroesophageal reflux disease)    . Hemorrhoids   . Diverticulosis   . Hyperlipidemia   . Hypertension   . Von Willebrand disease   . PUD (peptic ulcer disease)   . Hearing loss   . GI AVM (gastrointestinal arteriovenous vascular malformation)   . History of prostatitis   . Hemorrhoids   . Macular degeneration, wet     receiving intra-ocular injections ([place])  . Back pain     improved with gabapentin as of 2015    Assessment: 80 YOM 2 days s/p lap appy who was on ceftriaxone and Flagyl, also had azithromycin added for atypical coverage for ?bronchitis vs COPD exacerbation vs PNA. Now to transition to PO antibiotics with PO Flagyl and PO levofloxacin. SCr 0.93 with crcl ~6455mL/min- stable. WBC now normal at 7.8 and patient is afebrile.  Per surgery, he needs 7 total days of antibiotics from day of surgery (end date of 11/23)- this would also be 6 days of respiratory coverage with an acceptable regimen (ceftriaxone + azithro for 1 day followed by 6 days of levofloxacin)  Goal of Therapy:  eradication of infection  Plan:  1. Levofloxacin 750mg  PO q24h x 5 days 2. Pharmacy to sign off as no dose adjustment is required and LOT will not need to be followed. Please re-consult if needed or call with questions.   Thanks,  Eliott NineLauren D. Ajamu Maxon, PharmD, BCPS Clinical Pharmacist Pager: [phone] 07/10/2014 9:29 AM

## 2014-07-11 ENCOUNTER — Encounter (HOSPITAL_COMMUNITY): Payer: Self-pay | Admitting: Pulmonary Disease

## 2014-07-11 DIAGNOSIS — R062 Wheezing: Secondary | ICD-10-CM

## 2014-07-11 DIAGNOSIS — F05 Delirium due to known physiological condition: Secondary | ICD-10-CM

## 2014-07-11 LAB — BASIC METABOLIC PANEL
Anion gap: 16 — ABNORMAL HIGH (ref 5–15)
BUN: 23 mg/dL (ref 6–23)
CO2: 22 mEq/L (ref 19–32)
Calcium: 8.8 mg/dL (ref 8.4–10.5)
Chloride: 89 mEq/L — ABNORMAL LOW (ref 96–112)
Creatinine, Ser: 0.83 mg/dL (ref 0.50–1.35)
GFR calc Af Amer: 90 mL/min — ABNORMAL LOW (ref >90)
GFR calc non Af Amer: 77 mL/min — ABNORMAL LOW (ref >90)
Glucose, Bld: 165 mg/dL — ABNORMAL HIGH (ref 70–99)
Potassium: 3.5 mEq/L — ABNORMAL LOW (ref 3.7–5.3)
Sodium: 127 mEq/L — ABNORMAL LOW (ref 137–147)

## 2014-07-11 LAB — CBC
HCT: 32.8 % — ABNORMAL LOW (ref 39.0–52.0)
Hemoglobin: 11 g/dL — ABNORMAL LOW (ref 13.0–17.0)
MCH: 28.1 pg (ref 26.0–34.0)
MCHC: 33.5 g/dL (ref 30.0–36.0)
MCV: 83.7 fL (ref 78.0–100.0)
Platelets: 310 10*3/uL (ref 150–400)
RBC: 3.92 MIL/uL — ABNORMAL LOW (ref 4.22–5.81)
RDW: 13.1 % (ref 11.5–15.5)
WBC: 11.4 10*3/uL — ABNORMAL HIGH (ref 4.0–10.5)

## 2014-07-11 MED ORDER — METHYLPREDNISOLONE SODIUM SUCC 40 MG IJ SOLR
40.0000 mg | Freq: Every day | INTRAMUSCULAR | Status: DC
  Administered 2014-07-12 – 2014-07-13 (×2): 40 mg via INTRAVENOUS
  Filled 2014-07-11 (×3): qty 1

## 2014-07-11 MED ORDER — ZOLPIDEM TARTRATE 5 MG PO TABS
5.0000 mg | ORAL_TABLET | Freq: Once | ORAL | Status: AC
  Administered 2014-07-11: 5 mg via ORAL
  Filled 2014-07-11: qty 1

## 2014-07-11 MED ORDER — PANTOPRAZOLE SODIUM 40 MG PO TBEC
40.0000 mg | DELAYED_RELEASE_TABLET | Freq: Two times a day (BID) | ORAL | Status: DC
  Administered 2014-07-11 – 2014-07-15 (×8): 40 mg via ORAL
  Filled 2014-07-11 (×7): qty 1

## 2014-07-11 MED ORDER — POTASSIUM CHLORIDE CRYS ER 20 MEQ PO TBCR
40.0000 meq | EXTENDED_RELEASE_TABLET | Freq: Once | ORAL | Status: AC
  Administered 2014-07-11: 40 meq via ORAL
  Filled 2014-07-11: qty 2

## 2014-07-11 NOTE — Progress Notes (Signed)
Pt has distended abd. States he feels distended and tender. Noted surigcal sites WNL, steri strips intact. Pt voiding frequently yet small amounts. CNA completes bladder scan and it shows "greater than 913." This RN paged Dr to inquire about in/ out cath VS indwelling foley as interventions to relieve distention and retention.

## 2014-07-11 NOTE — Consult Note (Signed)
Name: [name] MRN: [id number] DOB: 09-Jul-1926    ADMISSION DATE:  07/06/2014 CONSULTATION DATE:  07/11/14  REFERRING MD :  Dr. Waymon Amato   CHIEF COMPLAINT:  Wheezing   BRIEF PATIENT DESCRIPTION: 80 y/o M, former smoker, admitted 11/15 with back & abdominal pain.  Work up found the patient to have acute appendicitis with phlegmon, initial decision to treat medically.  He worsened and underwent an appendectomy on 11/16.  Developed respiratory difficulties and PCCM consulted 11/20.    SIGNIFICANT EVENTS  11/15  Admit with abd pain, found to have acute appendicitis with phlegmon.  Decision to treat medically 11/16  Worsened pain, WBC.  To OR for appendectomy 11/20  PCCM consulted for pulmonary evaluation of wheezing   STUDIES:  11/20  CT ABD/Pelvis >> acute appendicitis   HISTORY OF PRESENT ILLNESS:  80 y/o M, former smoker, with a PMH of Von Willebrand disease, HTN, HLD,  Barrett Esophagus, PUD, GI AVM, Hearing loss, and wet macular degeneration who presented to the [facility] ER on 11/15 with 3 day history of back & RLQ abdominal pain.  Work up found the patient to have acute appendicitis with phlegmon on CT ABD with initial decision to treat medically.  Unfortunately,he worsened and underwent an appendectomy on 11/16 for perforated appendicitis (noted on removal) .  He developed wheezing, cough, PCCM consulted 11/20.    Currently patients wife reports he has had a non-productive cough, wheezing and several episodes of worsened reflux.  At baseline, he sleeps with his bed elevated 6 inches and is on protonix / ranitidine / carafate.  He has a longstanding history of difficulties we reflux.  He denies fevers, chills, shortness of breath, significant sputum production or chest pain.  Wife is concerned that he was medicated with Ambien at 0300 and he has been sleeping all day.    He is very active at baseline, continues to drive, farm and operate heavy machinery.    PAST MEDICAL HISTORY  :   has a past medical history of Barrett esophagus (09/2003); GERD (gastroesophageal reflux disease); Hemorrhoids; Diverticulosis; Hyperlipidemia; Hypertension; Von Willebrand disease; PUD (peptic ulcer disease); Hearing loss; GI AVM (gastrointestinal arteriovenous vascular malformation); History of prostatitis; Hemorrhoids; Macular degeneration, wet; and Back pain.  has past surgical history that includes Inguinal hernia repair (2001) and laparoscopic appendectomy (N/A, 07/07/2014).   Prior to Admission medications   Medication Sig Start Date End Date Taking? Authorizing Provider  acetaminophen (TYLENOL) 500 MG tablet Take 1,000 mg by mouth every 6 (six) hours as needed for pain.   Yes Historical Provider, MD  aspirin EC 81 MG tablet Take 81 mg by mouth every evening.   Yes Historical Provider, MD  Bevacizumab (AVASTIN IV) Place into the right eye See admin instructions. Every 7 weeks   Yes Historical Provider, MD  cetirizine (ZYRTEC) 10 MG tablet Take 10 mg by mouth daily as needed for allergies.    Yes Historical Provider, MD  diltiazem (DILACOR XR) 240 MG 24 hr capsule Take 1 capsule (240 mg total) by mouth daily. 08/12/13  Yes Jacques Navy, MD  fluticasone (FLONASE) 50 MCG/ACT nasal spray Place 2 sprays into the nose daily as needed for allergies.    Yes Historical Provider, MD  furosemide (LASIX) 40 MG tablet Take 1 tablet (40 mg total) by mouth daily. 05/13/14  Yes Joaquim Nam, MD  gabapentin (NEURONTIN) 300 MG capsule Take 1 capsule (300 mg total) by mouth 2 (two) times daily.   Yes  Joaquim NamGraham S Duncan, MD  gemfibrozil (LOPID) 600 MG tablet TAKE 1 TABLET BY MOUTH TWICE A DAY 06/26/14  Yes Joaquim NamGraham S Duncan, MD  Hypromellose (ARTIFICIAL TEARS OP) Apply 1 drop to eye daily as needed (itchy / watery eyes).   Yes Historical Provider, MD  ibuprofen (ADVIL,MOTRIN) 200 MG tablet Take 200 mg by mouth every 6 (six) hours as needed for mild pain.   Yes Historical Provider, MD  metoprolol succinate  (TOPROL-XL) 25 MG 24 hr tablet Take 1 tablet (25 mg total) by mouth at bedtime. 02/04/14  Yes Joaquim NamGraham S Duncan, MD  Multiple Vitamins-Minerals (OCUVITE ADULT 50+ PO) Take 1 capsule by mouth daily.     Yes Historical Provider, MD  pantoprazole (PROTONIX) 40 MG tablet Take 40 mg by mouth daily before breakfast. 02/28/14 02/28/15 Yes Joaquim NamGraham S Duncan, MD  potassium chloride SA (K-DUR,KLOR-CON) 20 MEQ tablet TAKE 1 TABLET BY MOUTH DAILY 07/03/14  Yes Joaquim NamGraham S Duncan, MD  ranitidine (ZANTAC) 150 MG tablet Take 1 tablet (150 mg total) by mouth 2 (two) times daily. 08/12/13  Yes Jacques NavyMichael E Norins, MD  sucralfate (CARAFATE) 1 G tablet One tablet by mouth before laying down prior to nap or bedtime. Patient taking differently: Take 1 g by mouth 2 (two) times daily.  05/28/14  Yes Joaquim NamGraham S Duncan, MD   Allergies  Allergen Reactions  . Atorvastatin     REACTION: muscles tense up   . Codeine Nausea And Vomiting  . Lisinopril Swelling    Lips and face swelling  . Statins Other (See Comments)    Muscle tense up    FAMILY HISTORY:  family history includes Arthritis in his sister; Cancer in his brother and mother; Coronary artery disease in his father; Heart attack in his father; Hyperlipidemia in his sister; Hypertension in his mother; Pancreatic cancer in his mother; Prostate cancer in his brother. There is no history of Diabetes.   SOCIAL HISTORY:  reports that he has quit smoking. He has never used smokeless tobacco. He reports that he does not drink alcohol or use illicit drugs.  REVIEW OF SYSTEMS:   Constitutional: Negative for fever, chills, weight loss, malaise/fatigue and diaphoresis.  HENT: Negative for hearing loss, ear pain, nosebleeds, congestion, sore throat, neck pain, tinnitus and ear discharge.   Eyes: Negative for blurred vision, double vision, photophobia, pain, discharge and redness.  Respiratory: Negative for hemoptysis, sputum production, shortness of breath, and stridor. + for cough and  wheezing   Cardiovascular: Negative for chest pain, palpitations, orthopnea, claudication, leg swelling and PND.  Gastrointestinal: Negative for heartburn, nausea, vomiting, abdominal pain, diarrhea, constipation, blood in stool and melena.  Genitourinary: Negative for dysuria, urgency, frequency, hematuria and flank pain.  Musculoskeletal: Negative for myalgias, back pain, joint pain and falls.  Skin: Negative for itching and rash.  Neurological: Negative for dizziness, tingling, tremors, sensory change, speech change, focal weakness, seizures, loss of consciousness, weakness and headaches.  Endo/Heme/Allergies: Negative for environmental allergies and polydipsia. Does not bruise/bleed easily.  SUBJECTIVE:   VITAL SIGNS: Temp:  [98 F (36.7 C)-98.4 F (36.9 C)] 98 F (36.7 C) (11/20 0634) Pulse Rate:  [78-85] 80 (11/20 0634) Resp:  [18-19] 18 (11/20 0634) BP: (132-179)/(66-76) 177/76 mmHg (11/20 0634) SpO2:  [96 %-99 %] 98 % (11/20 0831) FiO2 (%):  [21 %] 21 % (11/19 2053)  PHYSICAL EXAMINATION: General:  wdwn elderly male in NAD  Neuro:  AAOx4, speech clear, MAE  HEENT:  Mm pink/moist, no jvd Cardiovascular:  s1s2 rrr,  no m/r/g  Lungs:  resp's even/non-labored, lungs bilaterally with wheezing  Abdomen:  Round/soft, bsx4 active  Musculoskeletal:  No acute deformities  Skin:  Warm/dry, no edema    Recent Labs Lab 07/09/14 0529 07/10/14 0522 07/11/14 0557  NA 135* 131* 127*  K 3.8 3.6* 3.5*  CL 100 93* 89*  CO2 20 22 22   BUN 23 23 23   CREATININE 1.06 0.93 0.83  GLUCOSE 102* 146* 165*    Recent Labs Lab 07/09/14 0529 07/10/14 0522 07/11/14 0557  HGB 10.3* 9.9* 11.0*  HCT 32.7* 30.0* 32.8*  WBC 13.2* 7.8 11.4*  PLT 246 259 310   No results found.  ASSESSMENT / PLAN:   Wheezing - likely reflux contributing to wheeze, possible component of COPD with former smoking hx ? COPD - no PFT's, not on baseline inhalers at home. He does not appear to be a CO2 retainer  based on BMP.  Former Smoker  GERD  Plan: Reduce steroids as may be contributing to reflux sx Duonebs Q6 + PRN albuterol  Trend CXR, follow LLL Nasal hygiene with flonase Claritin  Push IS / Flutter - able to pull 1750 Levaquin for 5 days from pulmonary standpoint but doubt this is infectious etiology  PPI & Pepcid BID PRN carafate  Would avoid oversedation, no further ambien Need to mobilize / OOB to chair  Consider PFT's with outpatient primary MD  Canary BrimBrandi Ollis, NP-C [facility] Pgr: [phone] or [phone]  Continue with bronchodilator.  Agree with abx choice, avoid sedation (ativan given at 3 AM), mobilize and will need PFTs as outpatient.  Patient seen and examined, agree with above note.  I dictated the care and orders written for this patient under my direction.  Alyson ReedyWesam G Doria Fern, MD [phone]  07/11/2014, 1:21 PM

## 2014-07-11 NOTE — Progress Notes (Signed)
Patient ID: Martin [name], male   DOB: 08-27-1925, 80 y.o.   MRN: [id number] 4 Days Post-Op  Subjective: Pt still audibly wheezing still.  Passing BMs and tolerating his diet  Objective: Vital signs in last 24 hours: Temp:  [98 F (36.7 C)-98.4 F (36.9 C)] 98 F (36.7 C) (11/20 0634) Pulse Rate:  [78-85] 80 (11/20 0634) Resp:  [18-19] 18 (11/20 0634) BP: (132-179)/(66-76) 177/76 mmHg (11/20 0634) SpO2:  [96 %-99 %] 98 % (11/20 0831) FiO2 (%):  [21 %] 21 % (11/19 2053) Last BM Date: 07/10/14  Intake/Output from previous day: 11/19 0701 - 11/20 0700 In: 1060 [P.O.:1060] Out: 725 [Urine:725] Intake/Output this shift:    PE: Abd: soft, appropriately tender, +BS, ND  Lab Results:   Recent Labs  07/10/14 0522 07/11/14 0557  WBC 7.8 11.4*  HGB 9.9* 11.0*  HCT 30.0* 32.8*  PLT 259 310   BMET  Recent Labs  07/10/14 0522 07/11/14 0557  NA 131* 127*  K 3.6* 3.5*  CL 93* 89*  CO2 22 22  GLUCOSE 146* 165*  BUN 23 23  CREATININE 0.93 0.83  CALCIUM 8.4 8.8   PT/INR No results for input(s): LABPROT, INR in the last 72 hours. CMP     Component Value Date/Time   NA 127* 07/11/2014 0557   K 3.5* 07/11/2014 0557   CL 89* 07/11/2014 0557   CO2 22 07/11/2014 0557   GLUCOSE 165* 07/11/2014 0557   BUN 23 07/11/2014 0557   CREATININE 0.83 07/11/2014 0557   CALCIUM 8.8 07/11/2014 0557   PROT 6.7 07/07/2014 0533   ALBUMIN 3.1* 07/07/2014 0533   AST 17 07/07/2014 0533   ALT 9 07/07/2014 0533   ALKPHOS 80 07/07/2014 0533   BILITOT 0.6 07/07/2014 0533   GFRNONAA 77* 07/11/2014 0557   GFRAA 90* 07/11/2014 0557   Lipase  No results found for: LIPASE     Studies/Results: No results found.  Anti-infectives: Anti-infectives    Start     Dose/Rate Route Frequency Ordered Stop   07/10/14 1000  azithromycin (ZITHROMAX) tablet 250 mg  Status:  Discontinued     250 mg Oral Daily 07/09/14 0938 07/10/14 0921   07/10/14 1000  metroNIDAZOLE (FLAGYL) tablet 500 mg     500  mg Oral 3 times per day 07/10/14 0921     07/10/14 1000  levofloxacin (LEVAQUIN) tablet 750 mg     750 mg Oral Daily 07/10/14 0932 07/15/14 0959   07/09/14 1000  azithromycin (ZITHROMAX) tablet 500 mg     500 mg Oral Daily 07/09/14 0938 07/09/14 1032   07/07/14 1000  cefTRIAXone (ROCEPHIN) 1 g in dextrose 5 % 50 mL IVPB - Premix  Status:  Discontinued     1 g100 mL/hr over 30 Minutes Intravenous Every 12 hours 07/06/14 2332 07/10/14 0921   07/07/14 0600  metroNIDAZOLE (FLAGYL) IVPB 500 mg  Status:  Discontinued     500 mg100 mL/hr over 60 Minutes Intravenous Every 8 hours 07/06/14 2332 07/10/14 0921   07/06/14 2100  cefTRIAXone (ROCEPHIN) 1 g in dextrose 5 % 50 mL IVPB     1 g100 mL/hr over 30 Minutes Intravenous  Once 07/06/14 2054 07/06/14 2141   07/06/14 2100  metroNIDAZOLE (FLAGYL) IVPB 500 mg     500 mg100 mL/hr over 60 Minutes Intravenous  Once 07/06/14 2054 07/06/14 2253       Assessment/Plan  1. POD 4, s/p lap appy for perforated appendicitis 2. PNA  Plan: 1. Patient doing  well surgically.  Will need a total of 7 days of abx therapy.  On Levaquin Flagyl right now D5. 2. Defer to primary team about pulmonary care   LOS: 5 days    Hardy Harcum E 07/11/2014, 10:32 AM Pager: [phone]

## 2014-07-11 NOTE — Progress Notes (Addendum)
PROGRESS NOTE    Martin [name] ZOX:[id number] DOB: 1926-04-23 DOA: 07/06/2014 PCP: Crawford GivensGraham Duncan, MD  HPI/Brief narrative 80 year old male with history of von Willebrand's disease, HTN, HLD, presented with RLQ abdominal pain and underwent laparoscopic appendectomy for perforated appendicitis on 07/07/2014. General surgery continues to follow.   Assessment/Plan:  1. Perforated appendicitis: Surgery consultation and follow-up appreciated patient is status post laparoscopic appendectomy on 11/16. Surgery continues to follow. Patient tolerating diet. After discussing with surgery, antibiotics switched to oral levofloxacin and Flagyl to complete total 7 days treatment. Stable postoperative status. 2. Acute bronchitis/?? Pneumonia: Azithromycin had been added to IV Rocephin on 11/18. Now that antibiotics are being transitioned to oral, will switch to oral levofloxacin and treat for total 7 days. Follow-up chest x-ray in 4-6 weeks. 3. Possible COPD exacerbation/upper airway wheeze: Former smoker but no confirmed COPD. Patient was treated with IV Solu-Medrol, antibiotics, oxygen, bronchodilator nebulizations. Patient seemed to be improving clinically initially but seemed to be worse this morning. Pulmonary consultation and changes appreciated. Monitor. 4. Von Willebrand's disease: Patient completed 2 doses of DDAVP for stressful situation. 5. Essential hypertension:Controlled 6. History of hyperlipidemia 7. History of GERD: Continue PPI, Pepcid and sucralfate 8. Anemia: Stable 9. Hyponatremia:? Related to SIADH. Appears euvolemic clinically. Follow BMP in a.m. 10. Sedation/confusion: Seen 11/20 a.m. Likely secondary to Ambien that he received at 2:20 AM. Avoid Ambien and other psychotropic medications. Monitor. If confusion persists, consider changing Levaquin. 11. Hypokalemia: Replace and follow   Code Status: full Family Communication: discussed with spouse at bedside Disposition Plan: Not  medically stable for discharge.   Consultants:  General surgery  Pulmonology  Procedures:  Laparoscopic appendectomy 11/16  Foley catheter-DC'd  Antibiotics:  IV Rocephin-DC'd  IV Flagyl-DC'd  Oral azithromycin 11/18 > 11/19  Oral levofloxacin 11/19 >  Oral Flagyl 11/19 >  Subjective: Sleepy and confused this morning. Continues to wheeze.  Objective: Filed Vitals:   07/11/14 0213 07/11/14 0634 07/11/14 0831 07/11/14 1335  BP:  177/76  170/86  Pulse:  80  83  Temp:  98 F (36.7 C)  98 F (36.7 C)  TempSrc:  Oral  Oral  Resp:  18  18  Height:      Weight:      SpO2: 99% 98% 98% 97%    Intake/Output Summary (Last 24 hours) at 07/11/14 1641 Last data filed at 07/11/14 1417  Gross per 24 hour  Intake   1180 ml  Output    325 ml  Net    855 ml   Filed Weights   07/06/14 1723 07/06/14 2312  Weight: 72.576 kg (160 lb) 79.379 kg (175 lb)     Exam:  General exam: pleasant elderly male was seen sitting on chair, audibly wheezing, and mild respiratory distress and somnolent this morning but easily arousable. Respiratory system: Reduced breath sounds bilaterally with scattered bilateral wheezing which seemed to be coming from upper airway. No crackles. Mild increased work of breathing. Cardiovascular system: S1 & S2 heard, RRR. No JVD, murmurs, gallops, clicks or pedal edema. Gastrointestinal system: Abdomen is nondistended, soft and nontender. Normal bowel sounds heard. Central nervous system: Somnolent but easily arousable and oriented 2. No focal neurological deficits. Extremities: Symmetric 5 x 5 power.   Data Reviewed: Basic Metabolic Panel:  Recent Labs Lab 07/07/14 0533 07/08/14 0451 07/09/14 0529 07/10/14 0522 07/11/14 0557  NA 134* 135* 135* 131* 127*  K 5.5* 4.4 3.8 3.6* 3.5*  CL 99 101 100 93* 89*  CO2  22 22 20 22 22   GLUCOSE 127* 122* 102* 146* 165*  BUN 23 23 23 23 23   CREATININE 1.62* 1.14 1.06 0.93 0.83  CALCIUM 8.5 8.5 8.1* 8.4  8.8   Liver Function Tests:  Recent Labs Lab 07/06/14 1730 07/07/14 0533  AST 19 17  ALT 10 9  ALKPHOS 112 80  BILITOT 0.3 0.6  PROT 7.5 6.7  ALBUMIN 3.9 3.1*   No results for input(s): LIPASE, AMYLASE in the last 168 hours. No results for input(s): AMMONIA in the last 168 hours. CBC:  Recent Labs Lab 07/06/14 1730 07/07/14 0533 07/08/14 0451 07/09/14 0529 07/10/14 0522 07/11/14 0557  WBC 15.7* 23.9* 17.7* 13.2* 7.8 11.4*  NEUTROABS 12.3*  --   --   --   --   --   HGB 13.5 12.2* 10.4* 10.3* 9.9* 11.0*  HCT 41.6 38.3* 33.2* 32.7* 30.0* 32.8*  MCV 88.1 88.7 87.4 87.2 83.8 83.7  PLT 283 236 215 246 259 310   Cardiac Enzymes: No results for input(s): CKTOTAL, CKMB, CKMBINDEX, TROPONINI in the last 168 hours. BNP (last 3 results) No results for input(s): PROBNP in the last 8760 hours. CBG:  Recent Labs Lab 07/08/14 1644  GLUCAP 155*    Recent Results (from the past 240 hour(s))  Surgical pcr screen     Status: None   Collection Time: 07/07/14 12:52 AM  Result Value Ref Range Status   MRSA, PCR NEGATIVE NEGATIVE Final   Staphylococcus aureus NEGATIVE NEGATIVE Final    Comment:        The Xpert SA Assay (FDA approved for NASAL specimens in patients over 221 years of age), is one component of a comprehensive surveillance program.  Test performance has been validated by [facility] for patients greater than or equal to 78 year old. It is not intended to diagnose infection nor to guide or monitor treatment.           Studies: No results found.      Scheduled Meds: . aspirin EC  81 mg Oral QPM  . diltiazem  240 mg Oral Daily  . enoxaparin (LOVENOX) injection  40 mg Subcutaneous Q24H  . famotidine  20 mg Oral BID  . furosemide  40 mg Oral Daily  . gabapentin  300 mg Oral BID  . gemfibrozil  600 mg Oral BID AC  . guaiFENesin  1,200 mg Oral BID  . ipratropium-albuterol  3 mL Nebulization Q6H  . levofloxacin  750 mg Oral Daily  . loratadine   10 mg Oral Daily  . [START ON 07/12/2014] methylPREDNISolone (SOLU-MEDROL) injection  40 mg Intravenous Daily  . metoprolol succinate  25 mg Oral QHS  . metroNIDAZOLE  500 mg Oral 3 times per day  . pantoprazole  40 mg Oral BID  . potassium chloride SA  20 mEq Oral Daily  . sucralfate  1 g Oral BID   Continuous Infusions:   Principal Problem:   Appendicitis, acute Active Problems:   Hypercholesteremia   Von Willebrand's disease   Essential hypertension   GERD (gastroesophageal reflux disease)    Time spent: 40 minutes.    Marcellus ScottHONGALGI,Romeo Zielinski, MD, FACP, FHM. [facility] Pager [phone]  If 7PM-7AM, please contact night-coverage [email or web address] Password [email or web address] 07/11/2014, 4:41 PM    LOS: 5 days

## 2014-07-11 NOTE — Progress Notes (Signed)
Dr Claiborne Billingsallahan Hospitalist calls this RN and requests to in/ out cath pt at this time. He states to check bladder scan in 6 hours and if still retaining urine to page back.

## 2014-07-11 NOTE — Plan of Care (Signed)
Problem: Consults Goal: General Medical Patient Education See Patient Education Module for specific education.  Outcome: Progressing  Problem: Phase III Progression Outcomes Goal: Activity at appropriate level-compared to baseline (UP IN CHAIR FOR HEMODIALYSIS)  Outcome: Progressing Goal: Voiding independently Outcome: Not Progressing Pt voiding though also has urinary retention needing relief intervention. Goal: IV/normal saline lock discontinued Outcome: Completed/Met Date Met:  07/11/14 Saline lock IVF at this time per pt is retaining urine Goal: Foley discontinued Outcome: Completed/Met Date Met:  07/11/14  Problem: Discharge Progression Outcomes Goal: Pain controlled with appropriate interventions Outcome: Completed/Met Date Met:  07/11/14 Goal: Tolerating diet Outcome: Completed/Met Date Met:  07/11/14

## 2014-07-12 DIAGNOSIS — J9601 Acute respiratory failure with hypoxia: Secondary | ICD-10-CM

## 2014-07-12 DIAGNOSIS — J441 Chronic obstructive pulmonary disease with (acute) exacerbation: Secondary | ICD-10-CM | POA: Insufficient documentation

## 2014-07-12 DIAGNOSIS — R339 Retention of urine, unspecified: Secondary | ICD-10-CM | POA: Insufficient documentation

## 2014-07-12 DIAGNOSIS — J9811 Atelectasis: Secondary | ICD-10-CM

## 2014-07-12 LAB — BASIC METABOLIC PANEL
Anion gap: 13 (ref 5–15)
BUN: 20 mg/dL (ref 6–23)
CO2: 27 mEq/L (ref 19–32)
Calcium: 8.9 mg/dL (ref 8.4–10.5)
Chloride: 97 mEq/L (ref 96–112)
Creatinine, Ser: 0.86 mg/dL (ref 0.50–1.35)
GFR calc Af Amer: 88 mL/min — ABNORMAL LOW (ref >90)
GFR calc non Af Amer: 76 mL/min — ABNORMAL LOW (ref >90)
Glucose, Bld: 147 mg/dL — ABNORMAL HIGH (ref 70–99)
Potassium: 3.4 mEq/L — ABNORMAL LOW (ref 3.7–5.3)
Sodium: 137 mEq/L (ref 137–147)

## 2014-07-12 MED ORDER — TAMSULOSIN HCL 0.4 MG PO CAPS
0.4000 mg | ORAL_CAPSULE | Freq: Every day | ORAL | Status: DC
  Administered 2014-07-12 – 2014-07-15 (×4): 0.4 mg via ORAL
  Filled 2014-07-12 (×5): qty 1

## 2014-07-12 MED ORDER — POTASSIUM CHLORIDE CRYS ER 20 MEQ PO TBCR
40.0000 meq | EXTENDED_RELEASE_TABLET | Freq: Every day | ORAL | Status: DC
  Administered 2014-07-12 – 2014-07-15 (×4): 40 meq via ORAL
  Filled 2014-07-12 (×5): qty 2

## 2014-07-12 NOTE — Progress Notes (Signed)
   Name: [name] MRN: [id number] DOB: 1926-01-27    ADMISSION DATE:  07/06/2014 CONSULTATION DATE:  07/11/14  REFERRING MD :  Dr. Waymon AmatoHongalgi   CHIEF COMPLAINT:  Wheezing   BRIEF PATIENT DESCRIPTION: [age] yowm, quit smoking 30 y pta with no baseline limiting sob, admitted 11/15 with back & abdominal pain.  Work up found the patient to have acute appendicitis with phlegmon, initial decision to treat medically.  He worsened and underwent an appendectomy on 11/16.  Developed respiratory difficulties and PCCM consulted 11/20.    SIGNIFICANT EVENTS  11/15  Admit with abd pain, found to have acute appendicitis with phlegmon.  Decision to treat medically 11/16  Worsened pain, WBC.  To OR for appendectomy 11/20  PCCM consulted for pulmonary evaluation of wheezing   STUDIES:  11/20  CT ABD/Pelvis >> acute appendicitis   SUBJECTIVE:  Sitting in chair nad/ feeling much better p foley placed     VITAL SIGNS: Temp:  [97.6 F (36.4 C)-98.1 F (36.7 C)] 98.1 F (36.7 C) (11/21 0552) Pulse Rate:  [77-83] 77 (11/21 0552) Resp:  [18-19] 19 (11/21 0552) BP: (155-170)/(74-94) 155/74 mmHg (11/21 0552) SpO2:  [97 %-98 %] 97 % (11/21 0552)    PHYSICAL EXAMINATION: General:  wdwn elderly male in NAD  Neuro:  AAOx4, speech clear, MAE  HEENT:  Mm pink/moist, no jvd Cardiovascular:  s1s2 rrr, no m/r/g  Lungs:  resp's even/non-labored, lungs bilaterally with wheezing  Abdomen:  Round/soft, bsx4 active  Musculoskeletal:  No acute deformities  Skin:  Warm/dry, no edema    Recent Labs Lab 07/10/14 0522 07/11/14 0557 07/12/14 0529  NA 131* 127* 137  K 3.6* 3.5* 3.4*  CL 93* 89* 97  CO2 22 22 27   BUN 23 23 20   CREATININE 0.93 0.83 0.86  GLUCOSE 146* 165* 147*    Recent Labs Lab 07/09/14 0529 07/10/14 0522 07/11/14 0557  HGB 10.3* 9.9* 11.0*  HCT 32.7* 30.0* 32.8*  WBC 13.2* 7.8 11.4*  PLT 246 259 310   No results found.   CXR 06/29/14  Shallow inspiration with mild  infiltration or atelectasis in the lung bases.    ASSESSMENT / PLAN:   Wheezing - likely reflux contributing to wheeze - strongly doubt sign underlying COPD/ab by hx Post op ATX   Plan: Reduced steroids as may be contributing to reflux sx Duonebs Q6 + PRN albuterol  Nasal hygiene with flonase Claritin  Push IS / Flutter - able to pull 1750 - 2liters  Levaquin started 11/19 and ok to rx x 5 days from pulmonary standpoint but doubt this is infectious etiology  PPI & Pepcid BID PRN carafate  Would avoid oversedation, no further ambien reccheck cxr am 11/22     Acute hypoxemic resp failure/ 02 dep post Op - should be able to wean off for sat > 90% by discharge    Sandrea HughsMichael Wert, MD Pulmonary and Critical Care Medicine [facility] Cell [phone] After 5:30 PM or weekends, call [phone]

## 2014-07-12 NOTE — Progress Notes (Signed)
Patient ID: Marisue IvanJohn P Donlon, male   DOB: 05-Jun-1926, 80 y.o.   MRN: [id number] 5 Days Post-Op  Subjective: Patient feels well this am, except he had urinary retention overnight and foley had to be replaced.  Tolerating regular diet and moving his bowels  Objective: Vital signs in last 24 hours: Temp:  [97.6 F (36.4 C)-98.1 F (36.7 C)] 98.1 F (36.7 C) (11/21 0552) Pulse Rate:  [77-83] 77 (11/21 0552) Resp:  [18-19] 19 (11/21 0552) BP: (155-170)/(74-94) 155/74 mmHg (11/21 0552) SpO2:  [97 %-98 %] 97 % (11/21 0552) Last BM Date: 07/11/14  Intake/Output from previous day: 11/20 0701 - 11/21 0700 In: 1380 [P.O.:1380] Out: 3425 [Urine:3425] Intake/Output this shift:    PE: Abd: soft, minimally tender, +BS, incisions c/d/iw with ecchymosis around umbilical and RUQ incisions Lungs: CTAB  Lab Results:   Recent Labs  07/10/14 0522 07/11/14 0557  WBC 7.8 11.4*  HGB 9.9* 11.0*  HCT 30.0* 32.8*  PLT 259 310   BMET  Recent Labs  07/11/14 0557 07/12/14 0529  NA 127* 137  K 3.5* 3.4*  CL 89* 97  CO2 22 27  GLUCOSE 165* 147*  BUN 23 20  CREATININE 0.83 0.86  CALCIUM 8.8 8.9   PT/INR No results for input(s): LABPROT, INR in the last 72 hours. CMP     Component Value Date/Time   NA 137 07/12/2014 0529   K 3.4* 07/12/2014 0529   CL 97 07/12/2014 0529   CO2 27 07/12/2014 0529   GLUCOSE 147* 07/12/2014 0529   BUN 20 07/12/2014 0529   CREATININE 0.86 07/12/2014 0529   CALCIUM 8.9 07/12/2014 0529   PROT 6.7 07/07/2014 0533   ALBUMIN 3.1* 07/07/2014 0533   AST 17 07/07/2014 0533   ALT 9 07/07/2014 0533   ALKPHOS 80 07/07/2014 0533   BILITOT 0.6 07/07/2014 0533   GFRNONAA 76* 07/12/2014 0529   GFRAA 88* 07/12/2014 0529   Lipase  No results found for: LIPASE     Studies/Results: No results found.  Anti-infectives: Anti-infectives    Start     Dose/Rate Route Frequency Ordered Stop   07/10/14 1000  azithromycin (ZITHROMAX) tablet 250 mg  Status:  Discontinued      250 mg Oral Daily 07/09/14 0938 07/10/14 0921   07/10/14 1000  metroNIDAZOLE (FLAGYL) tablet 500 mg     500 mg Oral 3 times per day 07/10/14 0921     07/10/14 1000  levofloxacin (LEVAQUIN) tablet 750 mg     750 mg Oral Daily 07/10/14 0932 07/15/14 0959   07/09/14 1000  azithromycin (ZITHROMAX) tablet 500 mg     500 mg Oral Daily 07/09/14 0938 07/09/14 1032   07/07/14 1000  cefTRIAXone (ROCEPHIN) 1 g in dextrose 5 % 50 mL IVPB - Premix  Status:  Discontinued     1 g100 mL/hr over 30 Minutes Intravenous Every 12 hours 07/06/14 2332 07/10/14 0921   07/07/14 0600  metroNIDAZOLE (FLAGYL) IVPB 500 mg  Status:  Discontinued     500 mg100 mL/hr over 60 Minutes Intravenous Every 8 hours 07/06/14 2332 07/10/14 0921   07/06/14 2100  cefTRIAXone (ROCEPHIN) 1 g in dextrose 5 % 50 mL IVPB     1 g100 mL/hr over 30 Minutes Intravenous  Once 07/06/14 2054 07/06/14 2141   07/06/14 2100  metroNIDAZOLE (FLAGYL) IVPB 500 mg     500 mg100 mL/hr over 60 Minutes Intravenous  Once 07/06/14 2054 07/06/14 2253       Assessment/Plan  1. POD  5, s/p lap appy for perforated appendicitis 2. Acute bronchitis 3. Urinary retention  Plan: 1. Abdomen doing great.  D6 of abx therapy.  Will need a total of 7 days from an abdominal standpoint.  He is surgically stable for dc home when he becomes medically stable.  LOS: 6 days    Tajuana Kniskern E 07/12/2014, 10:41 AM Pager: [phone]

## 2014-07-12 NOTE — Progress Notes (Addendum)
PROGRESS NOTE    Marisue IvanJohn P Legros WUJ:[id number] DOB: 07-Jul-1926 DOA: 07/06/2014 PCP: Crawford GivensGraham Duncan, MD  HPI/Brief narrative 80 year old male with history of von Willebrand's disease, HTN, HLD, presented with RLQ abdominal pain and underwent laparoscopic appendectomy for perforated appendicitis on 07/07/2014. General surgery continues to follow.   Assessment/Plan:  1. Perforated appendicitis: Surgery consultation and follow-up appreciated patient is status post laparoscopic appendectomy on 11/16. Surgery continues to follow. Patient tolerating diet. After discussing with surgery, antibiotics switched to oral levofloxacin and Flagyl to complete total 7 days treatment on 11/22. Stable postoperative status. 2. Acute bronchitis/?? Pneumonia: Azithromycin had been added to IV Rocephin on 11/18. Now that antibiotics are being transitioned to oral, will switch to oral levofloxacin and treat for total 7 days. Follow-up chest x-ray in 4-6 weeks. 3. Possible COPD exacerbation/upper airway wheeze: Former smoker but no confirmed COPD. Patient was treated with IV Solu-Medrol, antibiotics, oxygen, bronchodilator nebulizations. Patient seemed to be improving clinically initially but seemed to be worse this morning. Pulmonary consultation and changes appreciated. Monitor. Much improved today. 4. Von Willebrand's disease: Patient completed 2 doses of DDAVP for stressful situation. 5. Essential hypertension:Controlled 6. History of hyperlipidemia 7. History of GERD: Continue PPI, Pepcid and sucralfate 8. Anemia: Stable 9. Hyponatremia:? Related to SIADH. Appears euvolemic clinically. Reesolved 10. Sedation/confusion: Seen 11/20 a.m. Likely secondary to Ambien that he received at 2:20 AM. Avoid Ambien and other psychotropic medications. Monitor. If confusion persists, consider changing Levaquin. Resolved 11. Hypokalemia: Replace. 12. Urinary Retention: Foley placed last night. Start Flomax. Possibly from BPH.  Attempt to DC Foley and voiding trial in a.m. prior to discharge.   Code Status: full Family Communication: discussed with spouse at bedside Disposition Plan: Possible discharge home 11/22.   Consultants:  General surgery  Pulmonology  Procedures:  Laparoscopic appendectomy 11/16  Foley catheter-DC'd  Antibiotics:  IV Rocephin-DC'd  IV Flagyl-DC'd  Oral azithromycin 11/18 > 11/19  Oral levofloxacin 11/19 >  Oral Flagyl 11/19 >  Subjective: Patient states that he feels "fine". Denies dyspnea, wheezing or cough. Urinary retention overnight.  Objective: Filed Vitals:   07/11/14 2114 07/12/14 0343 07/12/14 0552 07/12/14 1328  BP: 167/94  155/74 157/75  Pulse: 83 81 77 79  Temp: 97.6 F (36.4 C)  98.1 F (36.7 C) 98.2 F (36.8 C)  TempSrc: Oral  Oral Oral  Resp: 19 18 19 17   Height:      Weight:      SpO2: 97% 98% 97% 97%    Intake/Output Summary (Last 24 hours) at 07/12/14 1443 Last data filed at 07/12/14 1329  Gross per 24 hour  Intake    780 ml  Output   4225 ml  Net  -3445 ml   Filed Weights   07/06/14 1723 07/06/14 2312  Weight: 72.576 kg (160 lb) 79.379 kg (175 lb)     Exam:  General exam: pleasant elderly male sitting comfortably in chair. Looks much better than he did yesterday. Respiratory system: Clear to auscultation. No increased work of breathing. Cardiovascular system: S1 & S2 heard, RRR. No JVD, murmurs, gallops, clicks or pedal edema. Gastrointestinal system: Abdomen is nondistended, soft and nontender. Normal bowel sounds heard. Central nervous system: Alert and oriented 2. No focal neurological deficits. Extremities: Symmetric 5 x 5 power.   Data Reviewed: Basic Metabolic Panel:  Recent Labs Lab 07/08/14 0451 07/09/14 0529 07/10/14 0522 07/11/14 0557 07/12/14 0529  NA 135* 135* 131* 127* 137  K 4.4 3.8 3.6* 3.5* 3.4*  CL 101  100 93* 89* 97  CO2 22 20 22 22 27   GLUCOSE 122* 102* 146* 165* 147*  BUN 23 23 23 23 20     CREATININE 1.14 1.06 0.93 0.83 0.86  CALCIUM 8.5 8.1* 8.4 8.8 8.9   Liver Function Tests:  Recent Labs Lab 07/06/14 1730 07/07/14 0533  AST 19 17  ALT 10 9  ALKPHOS 112 80  BILITOT 0.3 0.6  PROT 7.5 6.7  ALBUMIN 3.9 3.1*   No results for input(s): LIPASE, AMYLASE in the last 168 hours. No results for input(s): AMMONIA in the last 168 hours. CBC:  Recent Labs Lab 07/06/14 1730 07/07/14 0533 07/08/14 0451 07/09/14 0529 07/10/14 0522 07/11/14 0557  WBC 15.7* 23.9* 17.7* 13.2* 7.8 11.4*  NEUTROABS 12.3*  --   --   --   --   --   HGB 13.5 12.2* 10.4* 10.3* 9.9* 11.0*  HCT 41.6 38.3* 33.2* 32.7* 30.0* 32.8*  MCV 88.1 88.7 87.4 87.2 83.8 83.7  PLT 283 236 215 246 259 310   Cardiac Enzymes: No results for input(s): CKTOTAL, CKMB, CKMBINDEX, TROPONINI in the last 168 hours. BNP (last 3 results) No results for input(s): PROBNP in the last 8760 hours. CBG:  Recent Labs Lab 07/08/14 1644  GLUCAP 155*    Recent Results (from the past 240 hour(s))  Surgical pcr screen     Status: None   Collection Time: 07/07/14 12:52 AM  Result Value Ref Range Status   MRSA, PCR NEGATIVE NEGATIVE Final   Staphylococcus aureus NEGATIVE NEGATIVE Final    Comment:        The Xpert SA Assay (FDA approved for NASAL specimens in patients over 78 years of age), is one component of a comprehensive surveillance program.  Test performance has been validated by [facility] for patients greater than or equal to 78 year old. It is not intended to diagnose infection nor to guide or monitor treatment.           Studies: No results found.      Scheduled Meds: . aspirin EC  81 mg Oral QPM  . diltiazem  240 mg Oral Daily  . enoxaparin (LOVENOX) injection  40 mg Subcutaneous Q24H  . famotidine  20 mg Oral BID  . furosemide  40 mg Oral Daily  . gabapentin  300 mg Oral BID  . gemfibrozil  600 mg Oral BID AC  . guaiFENesin  1,200 mg Oral BID  . ipratropium-albuterol  3 mL  Nebulization Q6H  . levofloxacin  750 mg Oral Daily  . loratadine  10 mg Oral Daily  . methylPREDNISolone (SOLU-MEDROL) injection  40 mg Intravenous Daily  . metoprolol succinate  25 mg Oral QHS  . metroNIDAZOLE  500 mg Oral 3 times per day  . pantoprazole  40 mg Oral BID  . potassium chloride SA  40 mEq Oral Daily  . sucralfate  1 g Oral BID   Continuous Infusions:   Principal Problem:   Appendicitis, acute Active Problems:   Hypercholesteremia   Von Willebrand's disease   Essential hypertension   GERD (gastroesophageal reflux disease)   Acute respiratory failure with hypoxemia   Atelectasis    Time spent: 20 minutes.    Marcellus ScottHONGALGI,Jaydyn Bozzo, MD, FACP, FHM. [facility] Pager [phone]  If 7PM-7AM, please contact night-coverage [email or web address] Password [email or web address] 07/12/2014, 2:43 PM    LOS: 6 days

## 2014-07-12 NOTE — Progress Notes (Signed)
Pt urinated . Post void bladder scan states "greater than 999." Dr was paged and orders received for indwelling foley catheter. Pt was informed and educated on rationale & procedure. 23F foley was placed with minimal discomfort and of clear yellow urine was returned.

## 2014-07-12 NOTE — Progress Notes (Signed)
Pt called wife after RN inserted foley catheter, wife and family arrive to unit at this time and asked RN for update. This RN explained pts issues with urinary retention and how foley catheter was placed as an intervention. Stated that the doctor will review with him further needs in his plan of care. Family did not have any adverse reactions to events occurred or information given.

## 2014-07-13 ENCOUNTER — Inpatient Hospital Stay (HOSPITAL_COMMUNITY): Payer: Medicare HMO

## 2014-07-13 LAB — CLOSTRIDIUM DIFFICILE BY PCR: Toxigenic C. Difficile by PCR: NEGATIVE

## 2014-07-13 LAB — CREATININE, SERUM
Creatinine, Ser: 0.94 mg/dL (ref 0.50–1.35)
GFR calc Af Amer: 85 mL/min — ABNORMAL LOW (ref >90)
GFR calc non Af Amer: 74 mL/min — ABNORMAL LOW (ref >90)

## 2014-07-13 NOTE — Progress Notes (Signed)
Patient ID: [name], male   DOB: 01/22/1926, 80 y.o.   MRN: [id number] 6 Days Post-Op  Subjective: Pt looks good today and feels well.  Tolerating a solid diet  Objective: Vital signs in last 24 hours: Temp:  [98.2 F (36.8 C)] 98.2 F (36.8 C) (11/22 0550) Pulse Rate:  [79] 79 (11/22 0550) Resp:  [16-17] 16 (11/22 0550) BP: (157-162)/(73-78) 162/78 mmHg (11/22 0550) SpO2:  [95 %-97 %] 95 % (11/22 0550) Last BM Date: 07/11/14  Intake/Output from previous day: 11/21 0701 - 11/22 0700 In: 220 [P.O.:220] Out: 1550 [Urine:1550] Intake/Output this shift:    PE: Abd: soft, appropriately tender, +BS, ND, incisions c/d/i  Lab Results:   Recent Labs  07/11/14 0557  WBC 11.4*  HGB 11.0*  HCT 32.8*  PLT 310   BMET  Recent Labs  07/11/14 0557 07/12/14 0529 07/13/14 0410  NA 127* 137  --   K 3.5* 3.4*  --   CL 89* 97  --   CO2 22 27  --   GLUCOSE 165* 147*  --   BUN 23 20  --   CREATININE 0.83 0.86 0.94  CALCIUM 8.8 8.9  --    PT/INR No results for input(s): LABPROT, INR in the last 72 hours. CMP     Component Value Date/Time   NA 137 07/12/2014 0529   K 3.4* 07/12/2014 0529   CL 97 07/12/2014 0529   CO2 27 07/12/2014 0529   GLUCOSE 147* 07/12/2014 0529   BUN 20 07/12/2014 0529   CREATININE 0.94 07/13/2014 0410   CALCIUM 8.9 07/12/2014 0529   PROT 6.7 07/07/2014 0533   ALBUMIN 3.1* 07/07/2014 0533   AST 17 07/07/2014 0533   ALT 9 07/07/2014 0533   ALKPHOS 80 07/07/2014 0533   BILITOT 0.6 07/07/2014 0533   GFRNONAA 74* 07/13/2014 0410   GFRAA 85* 07/13/2014 0410   Lipase  No results found for: LIPASE     Studies/Results: Dg Chest 2 View  07/13/2014   CLINICAL DATA:  Atelectasis, shortness of breath, productive cough, history hypertension, von Willebrand disease, GERD  EXAM: CHEST  2 VIEW  COMPARISON:  07/09/2014  FINDINGS: Enlargement of cardiac silhouette with slight vascular congestion.  Stable mediastinal contours.  RIGHT basilar atelectasis.   No acute infiltrate, pleural effusion or pneumothorax.  Osseous demineralization.  IMPRESSION: Mild enlargement of cardiac silhouette with RIGHT basilar atelectasis.   Electronically Signed   By: Ulyses SouthwardMark  Boles M.D.   On: 07/13/2014 09:21    Anti-infectives: Anti-infectives    Start     Dose/Rate Route Frequency Ordered Stop   07/10/14 1000  azithromycin (ZITHROMAX) tablet 250 mg  Status:  Discontinued     250 mg Oral Daily 07/09/14 0938 07/10/14 0921   07/10/14 1000  metroNIDAZOLE (FLAGYL) tablet 500 mg     500 mg Oral 3 times per day 07/10/14 0921     07/10/14 1000  levofloxacin (LEVAQUIN) tablet 750 mg     750 mg Oral Daily 07/10/14 0932 07/15/14 0959   07/09/14 1000  azithromycin (ZITHROMAX) tablet 500 mg     500 mg Oral Daily 07/09/14 0938 07/09/14 1032   07/07/14 1000  cefTRIAXone (ROCEPHIN) 1 g in dextrose 5 % 50 mL IVPB - Premix  Status:  Discontinued     1 g100 mL/hr over 30 Minutes Intravenous Every 12 hours 07/06/14 2332 07/10/14 0921   07/07/14 0600  metroNIDAZOLE (FLAGYL) IVPB 500 mg  Status:  Discontinued     500 mg100  mL/hr over 60 Minutes Intravenous Every 8 hours 07/06/14 2332 07/10/14 0921   07/06/14 2100  cefTRIAXone (ROCEPHIN) 1 g in dextrose 5 % 50 mL IVPB     1 g100 mL/hr over 30 Minutes Intravenous  Once 07/06/14 2054 07/06/14 2141   07/06/14 2100  metroNIDAZOLE (FLAGYL) IVPB 500 mg     500 mg100 mL/hr over 60 Minutes Intravenous  Once 07/06/14 2054 07/06/14 2253       Assessment/Plan  1. POD 6, s/p lap appy for perforated appendicitis 2. Urinary retention 3. Pulmonary issues  Plan: 1. Patient is surgically stable for dc home.  He is doing great.  Follow up has been arranged. 2. Today is D7 of abx therapy.  No further needed from abdominal standpoint after today.   LOS: 7 days    Marieke Lubke E 07/13/2014, 9:23 AM Pager: [phone]

## 2014-07-13 NOTE — Progress Notes (Signed)
PROGRESS NOTE    Martin [name] ZDG:[id number] DOB: 1926-03-01 DOA: 07/06/2014 PCP: Crawford GivensGraham Duncan, MD  HPI/Brief narrative 80 year old male with history of von Willebrand's disease, HTN, HLD, presented with RLQ abdominal pain and underwent laparoscopic appendectomy for perforated appendicitis on 07/07/2014. General surgery continues to follow.   Assessment/Plan:  1. Perforated appendicitis: Surgery consultation and follow-up appreciated patient is status post laparoscopic appendectomy on 11/16. Surgery continues to follow. Patient tolerating diet. After discussing with surgery, antibiotics switched to oral levofloxacin and Flagyl to complete total 7 days treatment on 11/22. Stable postoperative status. 2. Acute bronchitis/?? Pneumonia: Azithromycin had been added to IV Rocephin on 11/18. Now that antibiotics are being transitioned to oral, will switch to oral levofloxacin and treat for total 5 days. Chest x-ray suggests atelectasis 3. Possible COPD exacerbation/upper airway wheeze: Former smoker but no confirmed COPD. Patient was treated with IV Solu-Medrol, antibiotics, oxygen, bronchodilator nebulizations. Patient seemed to be improving clinically initially but seemed to be worse this morning. Pulmonary consultation and changes appreciated. Monitor. Improved and stable. 4. Von Willebrand's disease: Patient completed 2 doses of DDAVP for stressful situation. 5. Essential hypertension:Controlled 6. History of hyperlipidemia 7. History of GERD: Continue PPI, Pepcid and sucralfate 8. Anemia: Stable 9. Hyponatremia:? Related to SIADH. Appears euvolemic clinically. Reesolved 10. Sedation/confusion: Seen 11/20 a.m. Likely secondary to Ambien that he received at 2:20 AM. Avoid Ambien and other psychotropic medications. Monitor. If confusion persists, consider changing Levaquin. Resolved 11. Hypokalemia: Replaced. 12. Urinary Retention: Foley placed last night. Start Flomax. Possibly from BPH.  Attempt to DC Foley unsuccessful and Foley was replaced 11/22. Discussed with Dr. Jerilee FieldMatthew Eskridge, urology and agrees that he should be discharged on Foley catheter for at least 2 weeks and Flomax and follow with him in office. 13. ? Diarrhea: As per nursing, patient has had 3-4 loose slimy stools. Not on stool softeners. C. difficile PCR pending. DC antibiotics which may be the cause. Monitor.   Code Status: full Family Communication: discussed with spouse at bedside Disposition Plan: Possible discharge home 11/23, pending improvement of loose stools.   Consultants:  General surgery  Pulmonology  Procedures:  Laparoscopic appendectomy 11/16  Foley catheter  Antibiotics:  IV Rocephin-DC'd  IV Flagyl-DC'd  Oral azithromycin 11/18 > 11/19  Oral levofloxacin 11/19 >11/22  Oral Flagyl 11/19 >11/22  Subjective: Denies dyspnea. Discontinued Foley catheter but was unable to void and had to be replaced and emptied 885 ML. 3-4 loose slimy stools. Tolerating diet.  Objective: Filed Vitals:   07/12/14 1328 07/12/14 2234 07/13/14 0550 07/13/14 1314  BP: 157/75 158/73 162/78 96/62  Pulse: 79 79 79 87  Temp: 98.2 F (36.8 C) 98.2 F (36.8 C) 98.2 F (36.8 C) 97.8 F (36.6 C)  TempSrc: Oral Oral  Oral  Resp: 17 16 16 16   Height:      Weight:      SpO2: 97% 95% 95% 96%    Intake/Output Summary (Last 24 hours) at 07/13/14 1631 Last data filed at 07/13/14 1559  Gross per 24 hour  Intake   1052 ml  Output   2300 ml  Net  -1248 ml   Filed Weights   07/06/14 1723 07/06/14 2312  Weight: 72.576 kg (160 lb) 79.379 kg (175 lb)     Exam:  General exam: pleasant elderly male sitting comfortably in chair.  Respiratory system: Clear to auscultation. No increased work of breathing. Cardiovascular system: S1 & S2 heard, RRR. No JVD, murmurs, gallops, clicks or pedal edema. Gastrointestinal  system: Abdomen is nondistended, soft and nontender. Normal bowel sounds  heard. Central nervous system: Alert and oriented 2. No focal neurological deficits. Extremities: Symmetric 5 x 5 power.   Data Reviewed: Basic Metabolic Panel:  Recent Labs Lab 07/08/14 0451 07/09/14 0529 07/10/14 0522 07/11/14 0557 07/12/14 0529 07/13/14 0410  NA 135* 135* 131* 127* 137  --   K 4.4 3.8 3.6* 3.5* 3.4*  --   CL 101 100 93* 89* 97  --   CO2 22 20 22 22 27   --   GLUCOSE 122* 102* 146* 165* 147*  --   BUN 23 23 23 23 20   --   CREATININE 1.14 1.06 0.93 0.83 0.86 0.94  CALCIUM 8.5 8.1* 8.4 8.8 8.9  --    Liver Function Tests:  Recent Labs Lab 07/06/14 1730 07/07/14 0533  AST 19 17  ALT 10 9  ALKPHOS 112 80  BILITOT 0.3 0.6  PROT 7.5 6.7  ALBUMIN 3.9 3.1*   No results for input(s): LIPASE, AMYLASE in the last 168 hours. No results for input(s): AMMONIA in the last 168 hours. CBC:  Recent Labs Lab 07/06/14 1730 07/07/14 0533 07/08/14 0451 07/09/14 0529 07/10/14 0522 07/11/14 0557  WBC 15.7* 23.9* 17.7* 13.2* 7.8 11.4*  NEUTROABS 12.3*  --   --   --   --   --   HGB 13.5 12.2* 10.4* 10.3* 9.9* 11.0*  HCT 41.6 38.3* 33.2* 32.7* 30.0* 32.8*  MCV 88.1 88.7 87.4 87.2 83.8 83.7  PLT 283 236 215 246 259 310   Cardiac Enzymes: No results for input(s): CKTOTAL, CKMB, CKMBINDEX, TROPONINI in the last 168 hours. BNP (last 3 results) No results for input(s): PROBNP in the last 8760 hours. CBG:  Recent Labs Lab 07/08/14 1644  GLUCAP 155*    Recent Results (from the past 240 hour(s))  Surgical pcr screen     Status: None   Collection Time: 07/07/14 12:52 AM  Result Value Ref Range Status   MRSA, PCR NEGATIVE NEGATIVE Final   Staphylococcus aureus NEGATIVE NEGATIVE Final    Comment:        The Xpert SA Assay (FDA approved for NASAL specimens in patients over 29 years of age), is one component of a comprehensive surveillance program.  Test performance has been validated by [facility] for patients greater than or equal to 67 year old. It  is not intended to diagnose infection nor to guide or monitor treatment.           Studies: Dg Chest 2 View  07/13/2014   CLINICAL DATA:  Atelectasis, shortness of breath, productive cough, history hypertension, von Willebrand disease, GERD  EXAM: CHEST  2 VIEW  COMPARISON:  07/09/2014  FINDINGS: Enlargement of cardiac silhouette with slight vascular congestion.  Stable mediastinal contours.  RIGHT basilar atelectasis.  No acute infiltrate, pleural effusion or pneumothorax.  Osseous demineralization.  IMPRESSION: Mild enlargement of cardiac silhouette with RIGHT basilar atelectasis.   Electronically Signed   By: Ulyses Southward M.D.   On: 07/13/2014 09:21        Scheduled Meds: . aspirin EC  81 mg Oral QPM  . diltiazem  240 mg Oral Daily  . enoxaparin (LOVENOX) injection  40 mg Subcutaneous Q24H  . famotidine  20 mg Oral BID  . furosemide  40 mg Oral Daily  . gabapentin  300 mg Oral BID  . gemfibrozil  600 mg Oral BID AC  . guaiFENesin  1,200 mg Oral BID  . ipratropium-albuterol  3 mL Nebulization Q6H  . levofloxacin  750 mg Oral Daily  . loratadine  10 mg Oral Daily  . methylPREDNISolone (SOLU-MEDROL) injection  40 mg Intravenous Daily  . metoprolol succinate  25 mg Oral QHS  . metroNIDAZOLE  500 mg Oral 3 times per day  . pantoprazole  40 mg Oral BID  . potassium chloride SA  40 mEq Oral Daily  . sucralfate  1 g Oral BID  . tamsulosin  0.4 mg Oral Daily   Continuous Infusions:   Principal Problem:   Appendicitis, acute Active Problems:   Hypercholesteremia   Von Willebrand's disease   Essential hypertension   GERD (gastroesophageal reflux disease)   Acute respiratory failure with hypoxemia   Atelectasis   COPD exacerbation   Urinary retention    Time spent: 20 minutes.    Marcellus ScottHONGALGI,Perlita Forbush, MD, FACP, FHM. [facility] Pager [phone]  If 7PM-7AM, please contact night-coverage [email or web address] Password [email or web address] 07/13/2014, 4:31 PM    LOS: 7 days

## 2014-07-13 NOTE — Plan of Care (Signed)
Problem: Phase I Progression Outcomes Goal: Initial discharge plan identified Outcome: Progressing  Problem: Phase II Progression Outcomes Goal: Discharge plan established Outcome: Progressing Goal: IV changed to normal saline lock Outcome: Completed/Met Date Met:  07/13/14 Goal: Obtain order to discontinue catheter if appropriate Outcome: Progressing  Problem: Phase III Progression Outcomes Goal: Activity at appropriate level-compared to baseline (UP IN CHAIR FOR HEMODIALYSIS)  Outcome: Progressing Goal: Voiding independently Outcome: Not Progressing Goal: Discharge plan remains appropriate-arrangements made Outcome: Progressing

## 2014-07-14 DIAGNOSIS — R197 Diarrhea, unspecified: Secondary | ICD-10-CM

## 2014-07-14 DIAGNOSIS — R1110 Vomiting, unspecified: Secondary | ICD-10-CM | POA: Insufficient documentation

## 2014-07-14 LAB — BASIC METABOLIC PANEL
Anion gap: 12 (ref 5–15)
BUN: 21 mg/dL (ref 6–23)
CO2: 25 mEq/L (ref 19–32)
Calcium: 8.3 mg/dL — ABNORMAL LOW (ref 8.4–10.5)
Chloride: 102 mEq/L (ref 96–112)
Creatinine, Ser: 0.86 mg/dL (ref 0.50–1.35)
GFR calc Af Amer: 88 mL/min — ABNORMAL LOW (ref >90)
GFR calc non Af Amer: 76 mL/min — ABNORMAL LOW (ref >90)
Glucose, Bld: 119 mg/dL — ABNORMAL HIGH (ref 70–99)
Potassium: 3.7 mEq/L (ref 3.7–5.3)
Sodium: 139 mEq/L (ref 137–147)

## 2014-07-14 MED ORDER — IPRATROPIUM-ALBUTEROL 0.5-2.5 (3) MG/3ML IN SOLN: 3.0000 mL | RESPIRATORY_TRACT | Status: DC | PRN

## 2014-07-14 MED ORDER — LOPERAMIDE HCL 2 MG PO CAPS: 2.0000 mg | ORAL_CAPSULE | Freq: Three times a day (TID) | ORAL | Status: DC | PRN

## 2014-07-14 MED ORDER — SACCHAROMYCES BOULARDII 250 MG PO CAPS
250.0000 mg | ORAL_CAPSULE | Freq: Two times a day (BID) | ORAL | Status: DC
  Administered 2014-07-14 – 2014-07-15 (×2): 250 mg via ORAL
  Filled 2014-07-14 (×3): qty 1

## 2014-07-14 MED ORDER — LOPERAMIDE HCL 2 MG PO CAPS
2.0000 mg | ORAL_CAPSULE | Freq: Once | ORAL | Status: AC
  Administered 2014-07-14: 2 mg via ORAL
  Filled 2014-07-14: qty 1

## 2014-07-14 NOTE — Progress Notes (Signed)
Name: [name] MRN: [id number] DOB: Apr 06, 1926    ADMISSION DATE:  07/06/2014 CONSULTATION DATE:  07/11/14  REFERRING MD :  Dr. Waymon AmatoHongalgi   CHIEF COMPLAINT:  Wheezing   BRIEF PATIENT DESCRIPTION: [age] yowm, quit smoking 30 y pta with no baseline limiting sob, admitted 11/15 with back & abdominal pain.  Work up found the patient to have acute appendicitis with phlegmon, initial decision to treat medically.  He worsened and underwent an appendectomy on 11/16.  Developed respiratory difficulties and PCCM consulted 11/20.    SIGNIFICANT EVENTS  11/15  Admit with abd pain, found to have acute appendicitis with phlegmon.  Decision to treat medically 11/16  Worsened pain, WBC.  To OR for appendectomy 11/20  PCCM consulted for pulmonary evaluation of wheezing   STUDIES:  11/20  CT ABD/Pelvis >> acute appendicitis 11/22 CXR >>> R basilar atx, improved   SUBJECTIVE:  No acute events overnight.  Resting comfortable in bed.  Has been up and ambulating without problem per pt and his wife.  Feels much better this AM. Denies fevers/chills/sweats, cough, SOB.   VITAL SIGNS: Temp:  [97.7 F (36.5 C)-98.4 F (36.9 C)] 97.7 F (36.5 C) (11/23 0902) Pulse Rate:  [75-87] 84 (11/23 0902) Resp:  [16-18] 17 (11/23 0902) BP: (96-152)/(57-73) 146/57 mmHg (11/23 0902) SpO2:  [93 %-96 %] 95 % (11/23 0902)    PHYSICAL EXAMINATION: General:  wdwn elderly male in NAD  Neuro:  AAOx4, MAE  HEENT:  Mm pink/moist, no jvd Cardiovascular:  RRR, no M/R/G. Lungs:  Resps even and unlabored.  Faint wheeze. Abdomen:  Round/soft, bsx4 active, mild post op bruising around umbilicus.  Musculoskeletal:  No acute deformities, no edema Skin:  Warm/dry, no edema    Recent Labs Lab 07/11/14 0557 07/12/14 0529 07/13/14 0410 07/14/14 0517  NA 127* 137  --  139  K 3.5* 3.4*  --  3.7  CL 89* 97  --  102  CO2 22 27  --  25  BUN 23 20  --  21  CREATININE 0.83 0.86 0.94 0.86  GLUCOSE 165* 147*  --  119*     Recent Labs Lab 07/09/14 0529 07/10/14 0522 07/11/14 0557  HGB 10.3* 9.9* 11.0*  HCT 32.7* 30.0* 32.8*  WBC 13.2* 7.8 11.4*  PLT 246 259 310   Dg Chest 2 View  07/13/2014   CLINICAL DATA:  Atelectasis, shortness of breath, productive cough, history hypertension, von Willebrand disease, GERD  EXAM: CHEST  2 VIEW  COMPARISON:  07/09/2014  FINDINGS: Enlargement of cardiac silhouette with slight vascular congestion.  Stable mediastinal contours.  RIGHT basilar atelectasis.  No acute infiltrate, pleural effusion or pneumothorax.  Osseous demineralization.  IMPRESSION: Mild enlargement of cardiac silhouette with RIGHT basilar atelectasis.   Electronically Signed   By: Ulyses SouthwardMark  Boles M.D.   On: 07/13/2014 09:21    ASSESSMENT / PLAN:  Post op ATX - improving Acute hypoxemic resp failure/ 02 dep post Op - resolved, on RA 11/23 Wheezing - likely reflux contributing to wheeze, strongly doubt underlying COPD/ab by hx Plan: Continue to push IS / flutter valve OOB and mobilize as able Duonebs Q6 + PRN albuterol  Nasal hygiene with flonase Claritin  PPI & Pepcid BID PRN carafate  Would avoid oversedation, no further ambien Levaquin 5 day course completed - no need for further abx OK for discharge home from pulmonary standpoint   Rutherford Guysahul Desai, PA - [facility] Pgr: [phone]    Lungs clear, no complaints Does not need duonebs on discharge  PCCm to sign off  Djeneba Barsch V. MD 07/14/2014, 10:33 AM

## 2014-07-14 NOTE — Progress Notes (Signed)
PROGRESS NOTE    [name] [id number] DOB: June 30, 1926 DOA: 07/06/2014 PCP: Crawford GivensGraham Duncan, MD  HPI/Brief narrative 80 year old male with history of von Willebrand's disease, HTN, HLD, presented with RLQ abdominal pain and underwent laparoscopic appendectomy for perforated appendicitis on 07/07/2014. General surgery continues to follow.   Assessment/Plan:  1. Perforated appendicitis: Surgery consultation and follow-up appreciated patient is status post laparoscopic appendectomy on 11/16. Surgery continues to follow. Patient tolerating diet. After discussing with surgery, antibiotics switched to oral levofloxacin and Flagyl to complete total 7 days treatment on 11/22. Stable postoperative status. 2. Acute bronchitis/?? Pneumonia: Azithromycin had been added to IV Rocephin on 11/18. Now that antibiotics are being transitioned to oral, will switch to oral levofloxacin and treat for total 5 days. Chest x-ray suggests atelectasis 3. Possible COPD exacerbation/upper airway wheeze: Former smoker but no confirmed COPD. Patient was treated with IV Solu-Medrol, antibiotics, oxygen, bronchodilator nebulizations. Patient seemed to be improving clinically initially but seemed to be worse this morning. Pulmonary consultation and changes appreciated. Monitor. Improved and stable. DC'ed steroids after d/w PCCM. 4. Von Willebrand's disease: Patient completed 2 doses of DDAVP for stressful situation. 5. Essential hypertension:Controlled 6. History of hyperlipidemia 7. History of GERD: Continue PPI, Pepcid and sucralfate 8. Anemia: Stable 9. Hyponatremia:? Related to SIADH. Appears euvolemic clinically. Reesolved 10. Sedation/confusion: Seen 11/20 a.m. Likely secondary to Ambien that he received at 2:20 AM. Avoid Ambien and other psychotropic medications. Monitor. If confusion persists, consider changing Levaquin. Resolved 11. Hypokalemia: Replaced. 12. Urinary Retention: Foley placed last night. Start  Flomax. Possibly from BPH. Attempt to DC Foley unsuccessful and Foley was replaced 11/22. Discussed with Dr. Jerilee FieldMatthew Eskridge, urology and agrees that he should be discharged on Foley catheter for at least 2 weeks and Flomax and follow with him in office. 13. Diarrhea: Several episodes yesterday and last night. Not on stool softeners. C. difficile PCR negative. DC'ed antibiotics 11/22. Check GI pathogen panel PCR. Imodium when necessary.   Code Status: full Family Communication: discussed with spouse at bedside who expresses concern taking him home until diarrhea has settled. Disposition Plan: Discharge pending improvement of diarrhea.   Consultants:  General surgery  Pulmonology  Procedures:  Laparoscopic appendectomy 11/16  Foley catheter  Antibiotics:  IV Rocephin-DC'd  IV Flagyl-DC'd  Oral azithromycin 11/18 > 11/19  Oral levofloxacin 11/19 >11/22  Oral Flagyl 11/19 >11/22  Subjective: Ongoing diarrhea. No nausea, vomiting or abdominal pain. No difficulty breathing or cough.  Objective: Filed Vitals:   07/14/14 0829 07/14/14 0902 07/14/14 1204 07/14/14 1333  BP:  146/57 145/62 142/70  Pulse: 79 84 76 82  Temp:  97.7 F (36.5 C) 98.2 F (36.8 C) 98 F (36.7 C)  TempSrc:  Oral  Oral  Resp: 18 17 16 18   Height:      Weight:      SpO2: 93% 95% 95% 95%    Intake/Output Summary (Last 24 hours) at 07/14/14 1604 Last data filed at 07/14/14 1336  Gross per 24 hour  Intake   1080 ml  Output   2550 ml  Net  -1470 ml   Filed Weights   07/06/14 1723 07/06/14 2312  Weight: 72.576 kg (160 lb) 79.379 kg (175 lb)     Exam:  General exam: pleasant elderly male sitting comfortably in chair. Does not appear septic or toxic. Respiratory system: Clear to auscultation. No increased work of breathing. Cardiovascular system: S1 & S2 heard, RRR. No JVD, murmurs, gallops, clicks or pedal edema. Gastrointestinal system:  Abdomen is nondistended, soft and nontender. Normal  bowel sounds heard. Central nervous system: Alert and oriented 2. Hard of hearing. No focal neurological deficits. Extremities: Symmetric 5 x 5 power.   Data Reviewed: Basic Metabolic Panel:  Recent Labs Lab 07/09/14 0529 07/10/14 0522 07/11/14 0557 07/12/14 0529 07/13/14 0410 07/14/14 0517  NA 135* 131* 127* 137  --  139  K 3.8 3.6* 3.5* 3.4*  --  3.7  CL 100 93* 89* 97  --  102  CO2 20 22 22 27   --  25  GLUCOSE 102* 146* 165* 147*  --  119*  BUN 23 23 23 20   --  21  CREATININE 1.06 0.93 0.83 0.86 0.94 0.86  CALCIUM 8.1* 8.4 8.8 8.9  --  8.3*   Liver Function Tests: No results for input(s): AST, ALT, ALKPHOS, BILITOT, PROT, ALBUMIN in the last 168 hours. No results for input(s): LIPASE, AMYLASE in the last 168 hours. No results for input(s): AMMONIA in the last 168 hours. CBC:  Recent Labs Lab 07/08/14 0451 07/09/14 0529 07/10/14 0522 07/11/14 0557  WBC 17.7* 13.2* 7.8 11.4*  HGB 10.4* 10.3* 9.9* 11.0*  HCT 33.2* 32.7* 30.0* 32.8*  MCV 87.4 87.2 83.8 83.7  PLT 215 246 259 310   Cardiac Enzymes: No results for input(s): CKTOTAL, CKMB, CKMBINDEX, TROPONINI in the last 168 hours. BNP (last 3 results) No results for input(s): PROBNP in the last 8760 hours. CBG:  Recent Labs Lab 07/08/14 1644  GLUCAP 155*    Recent Results (from the past 240 hour(s))  Surgical pcr screen     Status: None   Collection Time: 07/07/14 12:52 AM  Result Value Ref Range Status   MRSA, PCR NEGATIVE NEGATIVE Final   Staphylococcus aureus NEGATIVE NEGATIVE Final    Comment:        The Xpert SA Assay (FDA approved for NASAL specimens in patients over 121 years of age), is one component of a comprehensive surveillance program.  Test performance has been validated by [facility] for patients greater than or equal to 78 year old. It is not intended to diagnose infection nor to guide or monitor treatment.   Clostridium Difficile by PCR     Status: None   Collection Time:  07/13/14 12:51 PM  Result Value Ref Range Status   C difficile by pcr NEGATIVE NEGATIVE Final          Studies: Dg Chest 2 View  07/13/2014   CLINICAL DATA:  Atelectasis, shortness of breath, productive cough, history hypertension, von Willebrand disease, GERD  EXAM: CHEST  2 VIEW  COMPARISON:  07/09/2014  FINDINGS: Enlargement of cardiac silhouette with slight vascular congestion.  Stable mediastinal contours.  RIGHT basilar atelectasis.  No acute infiltrate, pleural effusion or pneumothorax.  Osseous demineralization.  IMPRESSION: Mild enlargement of cardiac silhouette with RIGHT basilar atelectasis.   Electronically Signed   By: Ulyses SouthwardMark  Boles M.D.   On: 07/13/2014 09:21        Scheduled Meds: . aspirin EC  81 mg Oral QPM  . diltiazem  240 mg Oral Daily  . enoxaparin (LOVENOX) injection  40 mg Subcutaneous Q24H  . famotidine  20 mg Oral BID  . furosemide  40 mg Oral Daily  . gabapentin  300 mg Oral BID  . gemfibrozil  600 mg Oral BID AC  . guaiFENesin  1,200 mg Oral BID  . loratadine  10 mg Oral Daily  . metoprolol succinate  25 mg Oral QHS  .  pantoprazole  40 mg Oral BID  . potassium chloride SA  40 mEq Oral Daily  . sucralfate  1 g Oral BID  . tamsulosin  0.4 mg Oral Daily   Continuous Infusions:   Principal Problem:   Appendicitis, acute Active Problems:   Hypercholesteremia   Von Willebrand's disease   Essential hypertension   GERD (gastroesophageal reflux disease)   Acute respiratory failure with hypoxemia   Atelectasis   COPD exacerbation   Urinary retention    Time spent: 20 minutes.    Marcellus Scott, MD, FACP, FHM. [facility] Pager [phone]  If 7PM-7AM, please contact night-coverage [email or web address] Password [email or web address] 07/14/2014, 4:04 PM    LOS: 8 days

## 2014-07-14 NOTE — Progress Notes (Signed)
PT Cancellation Note  Patient Details Name: [name] MRN: [id number] DOB: 1926-01-27   Cancelled Treatment:    Reason Eval/Treat Not Completed: Other (comment). Patient is finally attempting to nap and stated he walked the unit earlier with nursing. Declining walk with therapist at this time. Will follow up in AM   Perla Echavarria, Adline PotterJulia Elizabeth 07/14/2014, 2:37 PM

## 2014-07-14 NOTE — Progress Notes (Signed)
Mr. Yetta BarreJones ambulated around the unit once with use of a front wheel walker and tolerated well.  Will continue to monitor. Vanice Sarahhompson, Jed Kutch L

## 2014-07-15 MED ORDER — LOPERAMIDE HCL 2 MG PO CAPS: 2.0000 mg | ORAL_CAPSULE | Freq: Three times a day (TID) | ORAL | Status: DC | PRN

## 2014-07-15 MED ORDER — TAMSULOSIN HCL 0.4 MG PO CAPS: 0.4000 mg | ORAL_CAPSULE | Freq: Every day | ORAL | Status: DC

## 2014-07-15 MED ORDER — PANTOPRAZOLE SODIUM 40 MG PO TBEC: 40.0000 mg | DELAYED_RELEASE_TABLET | Freq: Two times a day (BID) | ORAL | Status: DC

## 2014-07-15 MED ORDER — ACETAMINOPHEN 325 MG PO TABS
650.0000 mg | ORAL_TABLET | Freq: Four times a day (QID) | ORAL | Status: DC | PRN
Start: 2014-07-15 — End: 2015-03-30

## 2014-07-15 NOTE — Discharge Instructions (Signed)
[facility] ______[facility] °LAPAROSCOPIC SURGERY: POST OP INSTRUCTIONS °Always review your discharge instruction sheet given to you by the facility where your surgery was performed. °IF YOU HAVE DISABILITY OR FAMILY LEAVE FORMS, YOU MUST BRING THEM TO THE OFFICE FOR PROCESSING.   °DO NOT GIVE THEM TO YOUR DOCTOR. ° °1. A prescription for pain medication may be given to you upon discharge.  Take your pain medication as prescribed, if needed.  If narcotic pain medicine is not needed, then you may take acetaminophen (Tylenol) or ibuprofen (Advil) as needed. °2. Take your usually prescribed medications unless otherwise directed. °3. If you need a refill on your pain medication, please contact your pharmacy.  They will contact our office to request authorization. Prescriptions will not be filled after 5pm or on week-ends. °4. You should follow a light diet the first few days after arrival home, such as soup and crackers, etc.  Be sure to include lots of fluids daily. °5. Most patients will experience some swelling and bruising in the area of the incisions.  Ice packs will help.  Swelling and bruising can take several days to resolve.  °6. It is common to experience some constipation if taking pain medication after surgery.  Increasing fluid intake and taking a stool softener (such as Colace) will usually help or prevent this problem from occurring.  A mild laxative (Milk of Magnesia or Miralax) should be taken according to package instructions if there are no bowel movements after 48 hours. °7. Unless discharge instructions indicate otherwise, you may remove your bandages 24-48 hours after surgery, and you may shower at that time.  You may have steri-strips (small skin tapes) in place directly over the incision.  These strips should be left on the skin for 7-10 days.  If your surgeon used skin glue on the incision, you may shower in 24 hours.  The glue will flake off over the next 2-3 weeks.  Any sutures or  staples will be removed at the office during your follow-up visit. °8. ACTIVITIES:  You may resume regular (light) daily activities beginning the next day--such as daily self-care, walking, climbing stairs--gradually increasing activities as tolerated.  You may have sexual intercourse when it is comfortable.  Refrain from any heavy lifting or straining until approved by your doctor. °a. You may drive when you are no longer taking prescription pain medication, you can comfortably wear a seatbelt, and you can safely maneuver your car and apply brakes. °b. RETURN TO WORK:  __________________________________________________________ °9. You should see your doctor in the office for a follow-up appointment approximately 2-3 weeks after your surgery.  Make sure that you call for this appointment within a day or two after you arrive home to insure a convenient appointment time. °10. OTHER INSTRUCTIONS: __________________________________________________________________________________________________________________________ __________________________________________________________________________________________________________________________ °WHEN TO CALL YOUR DOCTOR: °1. Fever over 101.0 °2. Inability to urinate °3. Continued bleeding from incision. °4. Increased pain, redness, or drainage from the incision. °5. Increasing abdominal pain ° °The clinic staff is available to answer your questions during regular business hours.  Please don’t hesitate to call and ask to speak to one of the nurses for clinical concerns.  If you have a medical emergency, go to the nearest emergency room or call 911.  A surgeon from [facility] is always on call at the hospital. °[address], [address], [place], [place]  [address] ? [address] [address], [place],    [address] °[phone] ? [phone] ? FAX [phone] °Web site:   [email or web address]   Acute Urinary Retention Acute urinary retention is the  temporary inability to urinate. This is a common problem in older men. As men age their prostates become larger and block the flow of urine from the bladder. This is usually a problem that has come on gradually.  HOME CARE INSTRUCTIONS If you are sent home with a Foley catheter and a drainage system, you will need to discuss the best course of action with your health care provider. While the catheter is in, maintain a good intake of fluids. Keep the drainage bag emptied and lower than your catheter. This is so that contaminated urine will not flow back into your bladder, which could lead to a urinary tract infection. There are two main types of drainage bags. One is a large bag that usually is used at night. It has a good capacity that will allow you to sleep through the night without having to empty it. The second type is called a leg bag. It has a smaller capacity, so it needs to be emptied more frequently. However, the main advantage is that it can be attached by a leg strap and can go underneath your clothing, allowing you the freedom to move about or leave your home. Only take over-the-counter or prescription medicines for pain, discomfort, or fever as directed by your health care provider.  SEEK MEDICAL CARE IF:  You develop a low-grade fever.  You experience spasms or leakage of urine with the spasms. SEEK IMMEDIATE MEDICAL CARE IF:   You develop chills or fever.  Your catheter stops draining urine.  Your catheter falls out.  You start to develop increased bleeding that does not respond to rest and increased fluid intake. MAKE SURE YOU:  Understand these instructions.  Will watch your condition.  Will get help right away if you are not doing well or get worse. Document Released: 11/14/2000 Document Revised: 08/13/2013 Document Reviewed: 01/17/2013 [facility] Patient Information 2015 [place] [place]. This information is not intended to replace advice given to you by your health care  provider. Make sure you discuss any questions you have with your health care provider.  Acute Bronchitis Bronchitis is inflammation of the airways that extend from the windpipe into the lungs (bronchi). The inflammation often causes mucus to develop. This leads to a cough, which is the most common symptom of bronchitis.  In acute bronchitis, the condition usually develops suddenly and goes away over time, usually in a couple weeks. Smoking, allergies, and asthma can make bronchitis worse. Repeated episodes of bronchitis may cause further lung problems.  CAUSES Acute bronchitis is most often caused by the same virus that causes a cold. The virus can spread from person to person (contagious) through coughing, sneezing, and touching contaminated objects. SIGNS AND SYMPTOMS   Cough.   Fever.   Coughing up mucus.   Body aches.   Chest congestion.   Chills.   Shortness of breath.   Sore throat.  DIAGNOSIS  Acute bronchitis is usually diagnosed through a physical exam. Your health care provider will also ask you questions about your medical history. Tests, such as chest X-rays, are sometimes done to rule out other conditions.  TREATMENT  Acute bronchitis usually goes away in a couple weeks. Oftentimes, no medical treatment is necessary. Medicines are sometimes given for relief of fever or cough. Antibiotic medicines are usually not needed but may be prescribed in certain situations. In some cases, an inhaler may be recommended to help reduce shortness of breath and  control the cough. A cool mist vaporizer may also be used to help thin bronchial secretions and make it easier to clear the chest.  HOME CARE INSTRUCTIONS  Get plenty of rest.   Drink enough fluids to keep your urine clear or pale yellow (unless you have a medical condition that requires fluid restriction). Increasing fluids may help thin your respiratory secretions (sputum) and reduce chest congestion, and it will  prevent dehydration.   Take medicines only as directed by your health care provider.  If you were prescribed an antibiotic medicine, finish it all even if you start to feel better.  Avoid smoking and secondhand smoke. Exposure to cigarette smoke or irritating chemicals will make bronchitis worse. If you are a smoker, consider using nicotine gum or skin patches to help control withdrawal symptoms. Quitting smoking will help your lungs heal faster.   Reduce the chances of another bout of acute bronchitis by washing your hands frequently, avoiding people with cold symptoms, and trying not to touch your hands to your mouth, nose, or eyes.   Keep all follow-up visits as directed by your health care provider.  SEEK MEDICAL CARE IF: Your symptoms do not improve after 1 week of treatment.  SEEK IMMEDIATE MEDICAL CARE IF:  You develop an increased fever or chills.   You have chest pain.   You have severe shortness of breath.  You have bloody sputum.   You develop dehydration.  You faint or repeatedly feel like you are going to pass out.  You develop repeated vomiting.  You develop a severe headache. MAKE SURE YOU:   Understand these instructions.  Will watch your condition.  Will get help right away if you are not doing well or get worse. Document Released: 09/15/2004 Document Revised: 12/23/2013 Document Reviewed: 01/29/2013 [facility] Patient Information 2015 [place], [place]. This information is not intended to replace advice given to you by your health care provider. Make sure you discuss any questions you have with your health care provider.

## 2014-07-15 NOTE — Progress Notes (Signed)
D/c to home with family. VSS. Breathing regular and unlabored. Pain denied. Foley instructions given for care and wife demonstrated understanding via teachback. RX's given and explained.

## 2014-07-15 NOTE — Care Management (Signed)
07-15-14 Discussed Home heath RN and PT with patient and his wife at bedside.  Confirmed face sheet information . [facility]. [facility] use [facility] . [facility]erral given to [facility] .   Ronny FlurryHeather Kail Fraley RN BSN [phone]

## 2014-07-15 NOTE — Progress Notes (Signed)
PT Cancellation Note  Patient Details Name: [name] P Semper MRN: [id number] DOB: March 15, 1926   Cancelled Treatment:    Reason Eval/Treat Not Completed: Patient declined, no reason specified Pt asleep upon PT arrival. Wife requested to let pt sleep. Pt was up ambulating in hallway earlier with RN. Wife states pt to be d/ced later today. Will follow up if pt still in hospital next available time.   Alvie HeidelbergFolan, Lashon Hillier A 07/15/2014, 12:09 PM Alvie HeidelbergShauna Folan, PT, DPT [phone]

## 2014-07-15 NOTE — Discharge Summary (Signed)
Physician Discharge Summary  [name] ZOX:[id number] DOB: 05/10/1926 DOA: 07/06/2014  PCP: [name], MD  Admit date: 07/06/2014 Discharge date: 07/15/2014  Time spent: Less than 30 minutes  Recommendations for Outpatient Follow-up:  1. [facility] on 08/05/14 at 3:45 PM. 2. Dr. Jerilee Field, urology in 2 weeks to follow-up on urinary retention evaluation and management. 3. Dr. [name], PCP in 1 week with repeat labs (CBC & BMP). Please follow-up stool GI pathogen panel PCR results that were sent from the hospital. 4. Home health PT and RN. 5. Patient will be discharged on indwelling Foley catheter with leg bag until follow-up with urology as outpatient.  Discharge Diagnoses:  Principal Problem:   Appendicitis, acute Active Problems:   Hypercholesteremia   Von Willebrand's disease   Essential hypertension   GERD (gastroesophageal reflux disease)   Acute respiratory failure with hypoxemia   Atelectasis   COPD exacerbation   Urinary retention   Diarrhea   Discharge Condition: Improved & Stable  Diet recommendation: Heart healthy diet.  Filed Weights   07/06/14 1723 07/06/14 2312  Weight: 72.576 kg (160 lb) 79.379 kg (175 lb)    History of present illness:  80 year old male with history of von Willebrand's disease, HTN, HLD, presented with RLQ abdominal pain and underwent laparoscopic appendectomy for perforated appendicitis on 07/07/2014.  Hospital Course:     Perforated appendicitis: status post laparoscopic appendectomy on 11/16. Patient has done well postoperatively. Completed course of antibiotics. Tolerating diet and having normal BM. Outpatient follow-up with surgery has been arranged.  Acute bronchitis/?? Pneumonia: Felt more to be acute bronchitis than pneumonia. Completed course of antibiotics for same. Resolved.  Possible COPD exacerbation/upper airway wheeze: Former smoker but no confirmed COPD. Patient was treated with IV  Solu-Medrol, antibiotics, oxygen, bronchodilator nebulizations. Pulmonary was consulted and felt that his symptoms were more related to GERD than true COPD exacerbation. His steroids were rapidly tapered and discontinued. GERD medications were maximized. Patient has been asymptomatic off cough, dyspnea or wheezing for the last 48 hours.  Von Willebrand's disease: Patient completed 2 doses of DDAVP for stressful situation.  Essential hypertension:Controlled  History of hyperlipidemia  History of GERD: Continue PPI, Pepcid and sucralfate  Anemia: Stable  Hyponatremia:? Related to SIADH. Appears euvolemic clinically. Resolved  Sedation/confusion: Seen 11/20 a.m. Likely secondary to Ambien that he received at 2:20 AM. Avoid Ambien and other psychotropic medications. Resolved  Hypokalemia: Replaced. Likely secondary to GI losses and Lasix.  Urinary Retention: Foley placed. Started Flomax. Possibly from BPH. Attempt to DC Foley unsuccessful and Foley was replaced 11/22. Discussed with Dr. Jerilee Field, Urology and agrees that he should be discharged on Foley catheter for at least 2 weeks and Flomax and follow with him in office.  Diarrhea: Several episodes on 11/22 and 11/23. Not on stool softeners. C. difficile PCR negative. DC'ed antibiotics 11/22. Check GI pathogen panel PCR-results pending. Resolved. Patient states that he has not had a BM since 10 PM on 11/23. This was likely noninfectious diarrhea related to recent postop status for acute appendicitis and antibiotics.  Consultants:  General surgery  Pulmonology  Procedures:  Laparoscopic appendectomy 11/16  Foley catheter    Discharge Exam:  Complaints: Denies complaints and anxious to go home. Denies dyspnea, cough, wheezing or chest pain. Tolerating diet. No nausea, vomiting or abdominal pain. No further diarrhea. Last BM was on 11/23 at 10 PM.  Filed Vitals:   07/14/14 1836 07/14/14 2112 07/15/14 0530 07/15/14 1418   BP: 136/73  139/61 154/67 142/72  Pulse: 76 80 81 76  Temp: 97.6 F (36.4 C) 98.2 F (36.8 C) 98.6 F (37 C) 98.4 F (36.9 C)  TempSrc: Oral Oral Oral Oral  Resp: 18 18 18 20   Height:      Weight:      SpO2: 97% 93% 94% 96%   General exam: pleasant elderly male sitting comfortably in chair.  Respiratory system: Clear to auscultation. No increased work of breathing. Cardiovascular system: S1 & S2 heard, RRR. No JVD, murmurs, gallops, clicks or pedal edema. Gastrointestinal system: Abdomen is nondistended, soft and nontender. Normal bowel sounds heard. Central nervous system: Alert and oriented 3. Hard of hearing. No focal neurological deficits. Extremities: Symmetric 5 x 5 power.  Discharge Instructions      Discharge Instructions    Call MD for:  difficulty breathing, headache or visual disturbances    Complete by:  As directed      Call MD for:  persistant nausea and vomiting    Complete by:  As directed      Call MD for:  redness, tenderness, or signs of infection (pain, swelling, redness, odor or green/yellow discharge around incision site)    Complete by:  As directed      Call MD for:  severe uncontrolled pain    Complete by:  As directed      Call MD for:  temperature >100.4    Complete by:  As directed      Diet - low sodium heart healthy    Complete by:  As directed      Discharge instructions    Complete by:  As directed   Discharge home with urinary catheter until follow-up with urologist.     Increase activity slowly    Complete by:  As directed             Medication List    STOP taking these medications        ibuprofen 200 MG tablet  Commonly known as:  ADVIL,MOTRIN      TAKE these medications        acetaminophen 325 MG tablet  Commonly known as:  TYLENOL  Take 2 tablets (650 mg total) by mouth every 6 (six) hours as needed for mild pain, moderate pain, fever or headache (or Fever >/= 101).     ARTIFICIAL TEARS OP  Apply 1 drop to eye  daily as needed (itchy / watery eyes).     aspirin EC 81 MG tablet  Take 81 mg by mouth every evening.     AVASTIN IV  Place into the right eye See admin instructions. Every 7 weeks     cetirizine 10 MG tablet  Commonly known as:  ZYRTEC  Take 10 mg by mouth daily as needed for allergies.     diltiazem 240 MG 24 hr capsule  Commonly known as:  DILACOR XR  Take 1 capsule (240 mg total) by mouth daily.     fluticasone 50 MCG/ACT nasal spray  Commonly known as:  FLONASE  Place 2 sprays into the nose daily as needed for allergies.     furosemide 40 MG tablet  Commonly known as:  LASIX  Take 1 tablet (40 mg total) by mouth daily.     gabapentin 300 MG capsule  Commonly known as:  NEURONTIN  Take 1 capsule (300 mg total) by mouth 2 (two) times daily.     gemfibrozil 600 MG tablet  Commonly known as:  LOPID  TAKE 1 TABLET BY MOUTH TWICE A DAY     loperamide 2 MG capsule  Commonly known as:  IMODIUM  Take 1 capsule (2 mg total) by mouth 3 (three) times daily as needed for diarrhea or loose stools.     metoprolol succinate 25 MG 24 hr tablet  Commonly known as:  TOPROL-XL  Take 1 tablet (25 mg total) by mouth at bedtime.     OCUVITE ADULT 50+ PO  Take 1 capsule by mouth daily.     pantoprazole 40 MG tablet  Commonly known as:  PROTONIX  Take 1 tablet (40 mg total) by mouth 2 (two) times daily before a meal.     potassium chloride SA 20 MEQ tablet  Commonly known as:  K-DUR,KLOR-CON  TAKE 1 TABLET BY MOUTH DAILY     ranitidine 150 MG tablet  Commonly known as:  ZANTAC  Take 1 tablet (150 mg total) by mouth 2 (two) times daily.     sucralfate 1 G tablet  Commonly known as:  CARAFATE  One tablet by mouth before laying down prior to nap or bedtime.     tamsulosin 0.4 MG Caps capsule  Commonly known as:  FLOMAX  Take 1 capsule (0.4 mg total) by mouth daily.       Follow-up Information    [facility] [place] On [facility]2014.   Why:  3:45pm, arrive  by 3:15pm for paperwork   Contact information:   [address]   [place] [address] [phone]       Follow up with Jerilee FieldEskridge, Matthew, MD. Schedule an appointment as soon as possible for a visit in 2 weeks.   Specialty:  Urology   Why:  Follow up regarding urinary retention and decision regarding removing urinary catheter.   Contact information:   [address] [place] [address] [phone]       Follow up with Crawford GivensGraham Duncan, MD. Schedule an appointment as soon as possible for a visit in 1 week.   Specialty:  Family Medicine   Why:  To be seen with repeat labs (CBC & BMP).   Contact information:   [address] [place] [address] [phone]        The results of significant diagnostics from this hospitalization (including imaging, microbiology, ancillary and laboratory) are listed below for reference.    Significant Diagnostic Studies: Dg Chest 2 View  07/13/2014   CLINICAL DATA:  Atelectasis, shortness of breath, productive cough, history hypertension, von Willebrand disease, GERD  EXAM: CHEST  2 VIEW  COMPARISON:  07/09/2014  FINDINGS: Enlargement of cardiac silhouette with slight vascular congestion.  Stable mediastinal contours.  RIGHT basilar atelectasis.  No acute infiltrate, pleural effusion or pneumothorax.  Osseous demineralization.  IMPRESSION: Mild enlargement of cardiac silhouette with RIGHT basilar atelectasis.   Electronically Signed   By: Ulyses SouthwardMark  Boles M.D.   On: 07/13/2014 09:21   Ct Abdomen Pelvis W Contrast  07/06/2014   CLINICAL DATA:  Right-sided flank pain for 3 days  EXAM: CT ABDOMEN AND PELVIS WITH CONTRAST  TECHNIQUE: Multidetector CT imaging of the abdomen and pelvis was performed using the standard protocol following bolus administration of intravenous contrast.  CONTRAST:  100mL OMNIPAQUE IOHEXOL 300 MG/ML  SOLN  COMPARISON:  None.  FINDINGS: Lung bases are free of acute infiltrate or sizable effusion. A small hiatal  hernia is noted which appears to be sliding-type in nature.  The liver,  gallbladder, spleen, adrenal glands and pancreas are all normal in their CT appearance. Small calcified granulomas are noted within the spleen. Small duodenal diverticulum is noted adjacent to the head of the pancreas.  The kidneys are well visualized and demonstrate a normal enhancement pattern. A small cyst is noted in the lower pole of the right kidney. No calculi or obstructive changes are seen.  The appendix is dilated with significant wall thickening and periappendiceal inflammatory changes consistent with acute appendicitis. Scattered diverticular change is noted without evidence of diverticulitis. The bladder is well distended. No pelvic mass lesion is seen.  IMPRESSION: Findings consistent with acute appendicitis.  Chronic changes as described above.  These results were called by telephone at the time of interpretation on 07/06/2014 at 8:10 pm to Dr. Gwyneth Sprout , who verbally acknowledged these results.   Electronically Signed   By: Alcide Clever M.D.   On: 07/06/2014 20:10   Dg Chest Port 1 View  07/09/2014   CLINICAL DATA:  Wheezing and cough.  EXAM: PORTABLE CHEST - 1 VIEW  COMPARISON:  07/07/2014  FINDINGS: Shallow inspiration. Heart size and pulmonary vascularity are normal for technique. Mild residual infiltrative changes in the lung bases, similar to previous study. No progression. No blunting of costophrenic angles. No pneumothorax. Calcification of the aorta.  IMPRESSION: Shallow inspiration with mild infiltration or atelectasis in the lung bases.   Electronically Signed   By: Burman Nieves M.D.   On: 07/09/2014 06:02   Dg Chest Port 1 View  07/07/2014   CLINICAL DATA:  Short of breath and wheezing  EXAM: PORTABLE CHEST - 1 VIEW  COMPARISON:  12/12/2013  FINDINGS: Normal mediastinum and cardiac silhouette. A small focus of nodular airspace disease in the right lower lobe. No pleural fluid. No pneumothorax.   IMPRESSION: Small focus of airspace disease in the right lower lobe is concerning for early pneumonia.   Electronically Signed   By: Genevive Bi M.D.   On: 07/07/2014 07:20    Microbiology: Recent Results (from the past 240 hour(s))  Surgical pcr screen     Status: None   Collection Time: 07/07/14 12:52 AM  Result Value Ref Range Status   MRSA, PCR NEGATIVE NEGATIVE Final   Staphylococcus aureus NEGATIVE NEGATIVE Final    Comment:        The Xpert SA Assay (FDA approved for NASAL specimens in patients over 53 years of age), is one component of a comprehensive surveillance program.  Test performance has been validated by [employer] for patients greater than or equal to 32 year old. It is not intended to diagnose infection nor to guide or monitor treatment.   Clostridium Difficile by PCR     Status: None   Collection Time: 07/13/14 12:51 PM  Result Value Ref Range Status   C difficile by pcr NEGATIVE NEGATIVE Final     Labs: Basic Metabolic Panel:  Recent Labs Lab 07/09/14 0529 07/10/14 0522 07/11/14 0557 07/12/14 0529 07/13/14 0410 07/14/14 0517  NA 135* 131* 127* 137  --  139  K 3.8 3.6* 3.5* 3.4*  --  3.7  CL 100 93* 89* 97  --  102  CO2 20 22 22 27   --  25  GLUCOSE 102* 146* 165* 147*  --  119*  BUN 23 23 23 20   --  21  CREATININE 1.06 0.93 0.83 0.86 0.94 0.86  CALCIUM 8.1* 8.4 8.8 8.9  --  8.3*   Liver Function Tests: No results for  input(s): AST, ALT, ALKPHOS, BILITOT, PROT, ALBUMIN in the last 168 hours. No results for input(s): LIPASE, AMYLASE in the last 168 hours. No results for input(s): AMMONIA in the last 168 hours. CBC:  Recent Labs Lab 07/09/14 0529 07/10/14 0522 07/11/14 0557  WBC 13.2* 7.8 11.4*  HGB 10.3* 9.9* 11.0*  HCT 32.7* 30.0* 32.8*  MCV 87.2 83.8 83.7  PLT 246 259 310   Cardiac Enzymes: No results for input(s): CKTOTAL, CKMB, CKMBINDEX, TROPONINI in the last 168 hours. BNP: BNP (last 3 results) No results for  input(s): PROBNP in the last 8760 hours. CBG:  Recent Labs Lab 07/08/14 1644  GLUCAP 155*     Additional labs: 1. Pathology 07/07/14: Diagnosis Appendix, Other than Incidental - ACUTE AND SUPPURATIVE APPENDICITIS AND SEROSITIS WITH TRANSMURAL DEFECT. - THERE IS NO EVIDENCE OF MALIGNANCY.   Signed:  Marcellus Scott, MD, FACP, FHM. [facility] Pager [phone]  If 7PM-7AM, please contact night-coverage [email or web address] Password [email or web address] 07/15/2014, 3:22 PM

## 2014-07-21 LAB — GI PATHOGEN PANEL BY PCR, STOOL
C difficile toxin A/B: NEGATIVE
Campylobacter by PCR: NEGATIVE
Cryptosporidium by PCR: NEGATIVE
E coli (ETEC) LT/ST: NEGATIVE
E coli (STEC): NEGATIVE
E coli 0157 by PCR: NEGATIVE
G lamblia by PCR: NEGATIVE
Norovirus GI/GII: NEGATIVE
Rotavirus A by PCR: NEGATIVE
Salmonella by PCR: NEGATIVE
Shigella by PCR: NEGATIVE

## 2014-07-22 ENCOUNTER — Ambulatory Visit (INDEPENDENT_AMBULATORY_CARE_PROVIDER_SITE_OTHER): Payer: Medicare HMO | Admitting: Family Medicine

## 2014-07-22 ENCOUNTER — Encounter: Payer: Self-pay | Admitting: Family Medicine

## 2014-07-22 VITALS — BP 112/68 | HR 81 | Temp 97.8°F | Wt 152.5 lb

## 2014-07-22 DIAGNOSIS — I10 Essential (primary) hypertension: Secondary | ICD-10-CM

## 2014-07-22 DIAGNOSIS — K3589 Other acute appendicitis without perforation or gangrene: Secondary | ICD-10-CM

## 2014-07-22 DIAGNOSIS — Z9049 Acquired absence of other specified parts of digestive tract: Secondary | ICD-10-CM

## 2014-07-22 DIAGNOSIS — R339 Retention of urine, unspecified: Secondary | ICD-10-CM

## 2014-07-22 DIAGNOSIS — Z9889 Other specified postprocedural states: Secondary | ICD-10-CM

## 2014-07-22 LAB — CBC WITH DIFFERENTIAL/PLATELET
Basophils Absolute: 0 10*3/uL (ref 0.0–0.1)
Basophils Relative: 0.3 % (ref 0.0–3.0)
Eosinophils Absolute: 0.1 10*3/uL (ref 0.0–0.7)
Eosinophils Relative: 1.2 % (ref 0.0–5.0)
HCT: 40.7 % (ref 39.0–52.0)
Hemoglobin: 13 g/dL (ref 13.0–17.0)
Lymphocytes Relative: 10.9 % — ABNORMAL LOW (ref 12.0–46.0)
Lymphs Abs: 1.3 10*3/uL (ref 0.7–4.0)
MCHC: 32 g/dL (ref 30.0–36.0)
MCV: 86 fl (ref 78.0–100.0)
Monocytes Absolute: 0.8 10*3/uL (ref 0.1–1.0)
Monocytes Relative: 7.1 % (ref 3.0–12.0)
Neutro Abs: 9.6 10*3/uL — ABNORMAL HIGH (ref 1.4–7.7)
Neutrophils Relative %: 80.5 % — ABNORMAL HIGH (ref 43.0–77.0)
Platelets: 497 10*3/uL — ABNORMAL HIGH (ref 150.0–400.0)
RBC: 4.73 Mil/uL (ref 4.22–5.81)
RDW: 14.4 % (ref 11.5–15.5)
WBC: 11.9 10*3/uL — ABNORMAL HIGH (ref 4.0–10.5)

## 2014-07-22 NOTE — Patient Instructions (Addendum)
Go to the lab on the way out.  We'll contact you with your lab report. Take care.  Glad to see you.  I'll await the notes from surgery and urology.

## 2014-07-22 NOTE — Progress Notes (Signed)
Pre visit review using our clinic review tool, if applicable. No additional management support is needed unless otherwise documented below in the visit note.  Recently with RLQ pain, to ER, appendectomy the following day.  Due for f/u labs.   He has improved, and done well overall, but the acute change/hospital admission were upsetting to him (not that he was treated poorly, "they treated me like a king", but he isn't used to being ill and this was quite a change).  He is trying to work through all of that.   No abd pain, no FCNAVD.  Wounds healing well, no blood in stool.    H/o diarrhea, likely related to abx resolved, normal GI pathogen panel d/w pt.   Urinary retention noted, has f/u with uro pending.  Still with foley.   Meds, vitals, and allergies reviewed.   ROS: See HPI.  Otherwise, noncontributory.  nad ncat Mmm rrr ctab Abd soft, not ttp, minimal bruising and healing well Ext w/o edema.

## 2014-07-23 LAB — COMPREHENSIVE METABOLIC PANEL
ALT: 12 U/L (ref 0–53)
AST: 21 U/L (ref 0–37)
Albumin: 2.9 g/dL — ABNORMAL LOW (ref 3.5–5.2)
Alkaline Phosphatase: 65 U/L (ref 39–117)
BUN: 17 mg/dL (ref 6–23)
CO2: 27 mEq/L (ref 19–32)
Calcium: 8.6 mg/dL (ref 8.4–10.5)
Chloride: 98 mEq/L (ref 96–112)
Creatinine, Ser: 1.1 mg/dL (ref 0.4–1.5)
GFR: 69.52 mL/min (ref >60.00)
Glucose, Bld: 90 mg/dL (ref 70–99)
Potassium: 4.3 mEq/L (ref 3.5–5.1)
Sodium: 133 mEq/L — ABNORMAL LOW (ref 135–145)
Total Bilirubin: 0.4 mg/dL (ref 0.2–1.2)
Total Protein: 7.5 g/dL (ref 6.0–8.3)

## 2014-07-24 ENCOUNTER — Encounter: Payer: Self-pay | Admitting: *Deleted

## 2014-07-24 NOTE — Assessment & Plan Note (Signed)
Urinary retention noted, has f/u with uro pending.  Still with foley.

## 2014-07-24 NOTE — Assessment & Plan Note (Signed)
Resolved, doing well, should recovery.  His was "shaken" by being ill but he admits his spirits are improving.  App help of all involved. >25 minutes spent in face to face time with patient, >50% spent in counselling or coordination of care.

## 2014-07-31 ENCOUNTER — Other Ambulatory Visit: Payer: Self-pay | Admitting: Family Medicine

## 2014-08-11 ENCOUNTER — Other Ambulatory Visit: Payer: Self-pay | Admitting: Family Medicine

## 2014-08-15 ENCOUNTER — Inpatient Hospital Stay (HOSPITAL_COMMUNITY)
Admission: EM | Admit: 2014-08-15 | Discharge: 2014-08-17 | DRG: 391 | Disposition: A | Discharge status: Home / Self Care | Payer: Medicare HMO | Attending: Internal Medicine | Admitting: Internal Medicine

## 2014-08-15 ENCOUNTER — Emergency Department (HOSPITAL_COMMUNITY): Payer: Medicare HMO

## 2014-08-15 DIAGNOSIS — K5732 Diverticulitis of large intestine without perforation or abscess without bleeding: Secondary | ICD-10-CM | POA: Diagnosis not present

## 2014-08-15 DIAGNOSIS — E7800 Pure hypercholesterolemia, unspecified: Secondary | ICD-10-CM | POA: Diagnosis present

## 2014-08-15 DIAGNOSIS — D6800 Von Willebrand disease, unspecified: Secondary | ICD-10-CM | POA: Diagnosis present

## 2014-08-15 DIAGNOSIS — Z6823 Body mass index (BMI) 23.0-23.9, adult: Secondary | ICD-10-CM

## 2014-08-15 DIAGNOSIS — R109 Unspecified abdominal pain: Secondary | ICD-10-CM | POA: Diagnosis not present

## 2014-08-15 DIAGNOSIS — E669 Obesity, unspecified: Secondary | ICD-10-CM | POA: Diagnosis present

## 2014-08-15 DIAGNOSIS — K219 Gastro-esophageal reflux disease without esophagitis: Secondary | ICD-10-CM | POA: Diagnosis present

## 2014-08-15 DIAGNOSIS — J189 Pneumonia, unspecified organism: Secondary | ICD-10-CM | POA: Diagnosis present

## 2014-08-15 DIAGNOSIS — Z885 Allergy status to narcotic agent status: Secondary | ICD-10-CM

## 2014-08-15 DIAGNOSIS — I77811 Abdominal aortic ectasia: Secondary | ICD-10-CM | POA: Diagnosis present

## 2014-08-15 DIAGNOSIS — I10 Essential (primary) hypertension: Secondary | ICD-10-CM | POA: Diagnosis present

## 2014-08-15 DIAGNOSIS — Z79899 Other long term (current) drug therapy: Secondary | ICD-10-CM

## 2014-08-15 DIAGNOSIS — Y95 Nosocomial condition: Secondary | ICD-10-CM | POA: Diagnosis present

## 2014-08-15 DIAGNOSIS — K5792 Diverticulitis of intestine, part unspecified, without perforation or abscess without bleeding: Secondary | ICD-10-CM | POA: Diagnosis present

## 2014-08-15 DIAGNOSIS — R0602 Shortness of breath: Secondary | ICD-10-CM

## 2014-08-15 DIAGNOSIS — Z87891 Personal history of nicotine dependence: Secondary | ICD-10-CM

## 2014-08-15 DIAGNOSIS — Z888 Allergy status to other drugs, medicaments and biological substances status: Secondary | ICD-10-CM

## 2014-08-15 DIAGNOSIS — Z8711 Personal history of peptic ulcer disease: Secondary | ICD-10-CM

## 2014-08-15 DIAGNOSIS — Z7982 Long term (current) use of aspirin: Secondary | ICD-10-CM

## 2014-08-15 LAB — COMPREHENSIVE METABOLIC PANEL
ALT: 9 U/L (ref 0–53)
AST: 18 U/L (ref 0–37)
Albumin: 3.1 g/dL — ABNORMAL LOW (ref 3.5–5.2)
Alkaline Phosphatase: 97 U/L (ref 39–117)
Anion gap: 10 (ref 5–15)
BUN: 12 mg/dL (ref 6–23)
CO2: 25 mmol/L (ref 19–32)
Calcium: 8.8 mg/dL (ref 8.4–10.5)
Chloride: 100 mEq/L (ref 96–112)
Creatinine, Ser: 1.37 mg/dL — ABNORMAL HIGH (ref 0.50–1.35)
GFR calc Af Amer: 52 mL/min — ABNORMAL LOW (ref >90)
GFR calc non Af Amer: 45 mL/min — ABNORMAL LOW (ref >90)
Glucose, Bld: 113 mg/dL — ABNORMAL HIGH (ref 70–99)
Potassium: 3.4 mmol/L — ABNORMAL LOW (ref 3.5–5.1)
Sodium: 135 mmol/L (ref 135–145)
Total Bilirubin: 0.3 mg/dL (ref 0.3–1.2)
Total Protein: 7 g/dL (ref 6.0–8.3)

## 2014-08-15 LAB — CBC WITH DIFFERENTIAL/PLATELET
Basophils Absolute: 0 10*3/uL (ref 0.0–0.1)
Basophils Relative: 0 % (ref 0–1)
Eosinophils Absolute: 0.2 10*3/uL (ref 0.0–0.7)
Eosinophils Relative: 2 % (ref 0–5)
HCT: 38.2 % — ABNORMAL LOW (ref 39.0–52.0)
Hemoglobin: 12.2 g/dL — ABNORMAL LOW (ref 13.0–17.0)
Lymphocytes Relative: 26 % (ref 12–46)
Lymphs Abs: 2.5 10*3/uL (ref 0.7–4.0)
MCH: 27 pg (ref 26.0–34.0)
MCHC: 31.9 g/dL (ref 30.0–36.0)
MCV: 84.5 fL (ref 78.0–100.0)
Monocytes Absolute: 1 10*3/uL (ref 0.1–1.0)
Monocytes Relative: 11 % (ref 3–12)
Neutro Abs: 5.9 10*3/uL (ref 1.7–7.7)
Neutrophils Relative %: 61 % (ref 43–77)
Platelets: 300 10*3/uL (ref 150–400)
RBC: 4.52 MIL/uL (ref 4.22–5.81)
RDW: 13.8 % (ref 11.5–15.5)
WBC: 9.6 10*3/uL (ref 4.0–10.5)

## 2014-08-15 NOTE — ED Notes (Signed)
Pt had diarhea earlier in the week and took imodium and now has not had a BM in 2 days. Pt c/o lowerr adb pain x 3 days and has had shob x 1 day

## 2014-08-15 NOTE — ED Notes (Signed)
Pt had appendix out here in NOV

## 2014-08-16 ENCOUNTER — Encounter (HOSPITAL_COMMUNITY): Payer: Self-pay | Admitting: Emergency Medicine

## 2014-08-16 ENCOUNTER — Emergency Department (HOSPITAL_COMMUNITY): Payer: Medicare HMO

## 2014-08-16 DIAGNOSIS — Z79899 Other long term (current) drug therapy: Secondary | ICD-10-CM | POA: Diagnosis not present

## 2014-08-16 DIAGNOSIS — Z7982 Long term (current) use of aspirin: Secondary | ICD-10-CM | POA: Diagnosis not present

## 2014-08-16 DIAGNOSIS — R109 Unspecified abdominal pain: Secondary | ICD-10-CM | POA: Diagnosis present

## 2014-08-16 DIAGNOSIS — Z885 Allergy status to narcotic agent status: Secondary | ICD-10-CM | POA: Diagnosis not present

## 2014-08-16 DIAGNOSIS — D680 Von Willebrand's disease: Secondary | ICD-10-CM | POA: Diagnosis present

## 2014-08-16 DIAGNOSIS — Z87891 Personal history of nicotine dependence: Secondary | ICD-10-CM | POA: Diagnosis not present

## 2014-08-16 DIAGNOSIS — E780 Pure hypercholesterolemia: Secondary | ICD-10-CM

## 2014-08-16 DIAGNOSIS — I10 Essential (primary) hypertension: Secondary | ICD-10-CM | POA: Diagnosis present

## 2014-08-16 DIAGNOSIS — J189 Pneumonia, unspecified organism: Secondary | ICD-10-CM

## 2014-08-16 DIAGNOSIS — Z888 Allergy status to other drugs, medicaments and biological substances status: Secondary | ICD-10-CM | POA: Diagnosis not present

## 2014-08-16 DIAGNOSIS — E669 Obesity, unspecified: Secondary | ICD-10-CM | POA: Diagnosis present

## 2014-08-16 DIAGNOSIS — Z6823 Body mass index (BMI) 23.0-23.9, adult: Secondary | ICD-10-CM | POA: Diagnosis not present

## 2014-08-16 DIAGNOSIS — Z8711 Personal history of peptic ulcer disease: Secondary | ICD-10-CM | POA: Diagnosis not present

## 2014-08-16 DIAGNOSIS — K5792 Diverticulitis of intestine, part unspecified, without perforation or abscess without bleeding: Secondary | ICD-10-CM

## 2014-08-16 DIAGNOSIS — I77811 Abdominal aortic ectasia: Secondary | ICD-10-CM

## 2014-08-16 DIAGNOSIS — K219 Gastro-esophageal reflux disease without esophagitis: Secondary | ICD-10-CM | POA: Diagnosis present

## 2014-08-16 DIAGNOSIS — K5732 Diverticulitis of large intestine without perforation or abscess without bleeding: Secondary | ICD-10-CM | POA: Diagnosis present

## 2014-08-16 DIAGNOSIS — Y95 Nosocomial condition: Secondary | ICD-10-CM | POA: Diagnosis present

## 2014-08-16 LAB — URINALYSIS, ROUTINE W REFLEX MICROSCOPIC
Bilirubin Urine: NEGATIVE
Glucose, UA: NEGATIVE mg/dL
Hgb urine dipstick: NEGATIVE
Ketones, ur: NEGATIVE mg/dL
Leukocytes, UA: NEGATIVE
Nitrite: NEGATIVE
Protein, ur: NEGATIVE mg/dL
Specific Gravity, Urine: 1.019 (ref 1.005–1.030)
Urobilinogen, UA: 0.2 mg/dL (ref 0.0–1.0)
pH: 5 (ref 5.0–8.0)

## 2014-08-16 LAB — CBC
HCT: 34.7 % — ABNORMAL LOW (ref 39.0–52.0)
Hemoglobin: 11 g/dL — ABNORMAL LOW (ref 13.0–17.0)
MCH: 26.7 pg (ref 26.0–34.0)
MCHC: 31.7 g/dL (ref 30.0–36.0)
MCV: 84.2 fL (ref 78.0–100.0)
Platelets: 277 10*3/uL (ref 150–400)
RBC: 4.12 MIL/uL — ABNORMAL LOW (ref 4.22–5.81)
RDW: 13.8 % (ref 11.5–15.5)
WBC: 6.8 10*3/uL (ref 4.0–10.5)

## 2014-08-16 LAB — BASIC METABOLIC PANEL
Anion gap: 8 (ref 5–15)
BUN: 11 mg/dL (ref 6–23)
CO2: 26 mmol/L (ref 19–32)
Calcium: 8.4 mg/dL (ref 8.4–10.5)
Chloride: 105 mEq/L (ref 96–112)
Creatinine, Ser: 1.04 mg/dL (ref 0.50–1.35)
GFR calc Af Amer: 73 mL/min — ABNORMAL LOW (ref >90)
GFR calc non Af Amer: 63 mL/min — ABNORMAL LOW (ref >90)
Glucose, Bld: 90 mg/dL (ref 70–99)
Potassium: 3.6 mmol/L (ref 3.5–5.1)
Sodium: 139 mmol/L (ref 135–145)

## 2014-08-16 MED ORDER — SODIUM CHLORIDE 0.9 % IV SOLN
INTRAVENOUS | Status: DC
  Administered 2014-08-16 – 2014-08-17 (×3): via INTRAVENOUS

## 2014-08-16 MED ORDER — ACETAMINOPHEN 650 MG RE SUPP: 650.0000 mg | Freq: Four times a day (QID) | RECTAL | Status: DC | PRN

## 2014-08-16 MED ORDER — METRONIDAZOLE IN NACL 5-0.79 MG/ML-% IV SOLN
500.0000 mg | Freq: Three times a day (TID) | INTRAVENOUS | Status: DC
  Administered 2014-08-16 – 2014-08-17 (×3): 500 mg via INTRAVENOUS
  Filled 2014-08-16 (×6): qty 100

## 2014-08-16 MED ORDER — DILTIAZEM HCL ER 240 MG PO CP24
240.0000 mg | ORAL_CAPSULE | Freq: Every day | ORAL | Status: DC
  Administered 2014-08-16 – 2014-08-17 (×2): 240 mg via ORAL
  Filled 2014-08-16 (×2): qty 1

## 2014-08-16 MED ORDER — METRONIDAZOLE IN NACL 5-0.79 MG/ML-% IV SOLN
500.0000 mg | Freq: Once | INTRAVENOUS | Status: AC
  Administered 2014-08-16: 500 mg via INTRAVENOUS
  Filled 2014-08-16: qty 100

## 2014-08-16 MED ORDER — SUCRALFATE 1 G PO TABS
1.0000 g | ORAL_TABLET | Freq: Two times a day (BID) | ORAL | Status: DC
  Administered 2014-08-16 – 2014-08-17 (×3): 1 g via ORAL
  Filled 2014-08-16 (×5): qty 1

## 2014-08-16 MED ORDER — METOPROLOL SUCCINATE ER 25 MG PO TB24
25.0000 mg | ORAL_TABLET | Freq: Every day | ORAL | Status: DC
  Administered 2014-08-17: 25 mg via ORAL
  Filled 2014-08-16 (×2): qty 1

## 2014-08-16 MED ORDER — IOHEXOL 300 MG/ML  SOLN
25.0000 mL | Freq: Once | INTRAMUSCULAR | Status: AC | PRN
  Administered 2014-08-16: 25 mL via ORAL

## 2014-08-16 MED ORDER — GABAPENTIN 300 MG PO CAPS
300.0000 mg | ORAL_CAPSULE | Freq: Two times a day (BID) | ORAL | Status: DC
  Administered 2014-08-16 – 2014-08-17 (×3): 300 mg via ORAL
  Filled 2014-08-16 (×5): qty 1

## 2014-08-16 MED ORDER — GEMFIBROZIL 600 MG PO TABS
600.0000 mg | ORAL_TABLET | Freq: Two times a day (BID) | ORAL | Status: DC
  Administered 2014-08-16 – 2014-08-17 (×3): 600 mg via ORAL
  Filled 2014-08-16 (×4): qty 1

## 2014-08-16 MED ORDER — PANTOPRAZOLE SODIUM 40 MG PO TBEC
40.0000 mg | DELAYED_RELEASE_TABLET | Freq: Two times a day (BID) | ORAL | Status: DC
  Administered 2014-08-16 – 2014-08-17 (×3): 40 mg via ORAL
  Filled 2014-08-16 (×3): qty 1

## 2014-08-16 MED ORDER — HYDROMORPHONE HCL 1 MG/ML IJ SOLN: 0.5000 mg | INTRAMUSCULAR | Status: DC | PRN

## 2014-08-16 MED ORDER — LEVOFLOXACIN IN D5W 500 MG/100ML IV SOLN
500.0000 mg | Freq: Once | INTRAVENOUS | Status: AC
  Administered 2014-08-16: 500 mg via INTRAVENOUS
  Filled 2014-08-16: qty 100

## 2014-08-16 MED ORDER — ALUM & MAG HYDROXIDE-SIMETH 200-200-20 MG/5ML PO SUSP: 30.0000 mL | Freq: Four times a day (QID) | ORAL | Status: DC | PRN

## 2014-08-16 MED ORDER — SODIUM CHLORIDE 0.9 % IV SOLN
1000.0000 mL | Freq: Once | INTRAVENOUS | Status: AC
  Administered 2014-08-16: 1000 mL via INTRAVENOUS

## 2014-08-16 MED ORDER — IOHEXOL 300 MG/ML  SOLN
80.0000 mL | Freq: Once | INTRAMUSCULAR | Status: AC | PRN
  Administered 2014-08-16: 80 mL via INTRAVENOUS

## 2014-08-16 MED ORDER — ONDANSETRON HCL 4 MG/2ML IJ SOLN: 4.0000 mg | Freq: Four times a day (QID) | INTRAMUSCULAR | Status: DC | PRN

## 2014-08-16 MED ORDER — OXYCODONE HCL 5 MG PO TABS: 5.0000 mg | ORAL_TABLET | ORAL | Status: DC | PRN

## 2014-08-16 MED ORDER — ACETAMINOPHEN 325 MG PO TABS
650.0000 mg | ORAL_TABLET | Freq: Four times a day (QID) | ORAL | Status: DC | PRN
  Administered 2014-08-16: 650 mg via ORAL
  Filled 2014-08-16: qty 2

## 2014-08-16 MED ORDER — OCUVITE ADULT 50+ PO CAPS: 1.0000 | ORAL_CAPSULE | Freq: Every day | ORAL | Status: DC

## 2014-08-16 MED ORDER — SODIUM CHLORIDE 0.9 % IV SOLN
1000.0000 mL | INTRAVENOUS | Status: DC
  Administered 2014-08-16: 1000 mL via INTRAVENOUS

## 2014-08-16 MED ORDER — PROSIGHT PO TABS
1.0000 | ORAL_TABLET | Freq: Every day | ORAL | Status: DC
  Administered 2014-08-16 – 2014-08-17 (×2): 1 via ORAL
  Filled 2014-08-16 (×2): qty 1

## 2014-08-16 MED ORDER — LORATADINE 10 MG PO TABS
10.0000 mg | ORAL_TABLET | Freq: Every day | ORAL | Status: DC
Start: 2014-08-16 — End: 2014-08-17
  Administered 2014-08-16 – 2014-08-17 (×2): 10 mg via ORAL
  Filled 2014-08-16: qty 1

## 2014-08-16 MED ORDER — ONDANSETRON HCL 4 MG PO TABS: 4.0000 mg | ORAL_TABLET | Freq: Four times a day (QID) | ORAL | Status: DC | PRN

## 2014-08-16 MED ORDER — FUROSEMIDE 40 MG PO TABS
40.0000 mg | ORAL_TABLET | Freq: Every day | ORAL | Status: DC
  Administered 2014-08-16 – 2014-08-17 (×2): 40 mg via ORAL
  Filled 2014-08-16 (×2): qty 1

## 2014-08-16 MED ORDER — POTASSIUM CHLORIDE CRYS ER 20 MEQ PO TBCR
20.0000 meq | EXTENDED_RELEASE_TABLET | Freq: Every day | ORAL | Status: DC
  Administered 2014-08-16 – 2014-08-17 (×2): 20 meq via ORAL
  Filled 2014-08-16 (×2): qty 1

## 2014-08-16 MED ORDER — TAMSULOSIN HCL 0.4 MG PO CAPS
0.4000 mg | ORAL_CAPSULE | Freq: Every day | ORAL | Status: DC
  Administered 2014-08-16 – 2014-08-17 (×2): 0.4 mg via ORAL
  Filled 2014-08-16 (×2): qty 1

## 2014-08-16 MED ORDER — LEVOFLOXACIN IN D5W 750 MG/150ML IV SOLN
750.0000 mg | INTRAVENOUS | Status: DC
  Filled 2014-08-16: qty 150

## 2014-08-16 NOTE — H&P (Signed)
[facility] Admission History and Physical       [name] ZOX:[id number] DOB: 12-26-1925 DOA: 08/15/2014  Referring physician: EDP PCP: [name], MD  Specialists:   Chief Complaint: ABD Pain  HPI: [name] is a 80 y.o. male with a history of HTN, Hyperlipidemia, and Von Willebrandts Dz who presents to the ED with complaints of 8/10 Lower ABD Pain that has continued to worsen over the past 2 days.  He denies any nausea and vomiting, and denies having any fevers or chills.   He was found to have Sigmoid Diverticulitis on Ct scan.  He was placed on IV Levaquin and Flagyl and referred for admission.  Also the CT scn was read with findings of a LLL Pneumonia, however patient denies any subjective symptoms of pneumonia, however Levaquin was chosen  As antibiotic coverage to cover possible pneumonia as well and inta-Abdominal Infection.      Review of Systems:  Constitutional: No Weight Loss, No Weight Gain, Night Sweats, Fevers, Chills, Dizziness, Fatigue, or Generalized Weakness HEENT: No Headaches, Difficulty Swallowing,Tooth/Dental Problems,Sore Throat,  No Sneezing, Rhinitis, Ear Ache, Nasal Congestion, or Post Nasal Drip,  Cardio-vascular:  No Chest pain, Orthopnea, PND, Edema in Lower Extremities, Anasarca, Dizziness, Palpitations  Resp: No Dyspnea, No DOE, No Productive Cough, No Non-Productive Cough, No Hemoptysis, No Wheezing.    GI: No Heartburn, Indigestion, +Abdominal Pain, Nausea, Vomiting, Diarrhea, Hematemesis, Hematochezia, Melena, Change in Bowel Habits,  Loss of Appetite  GU: No Dysuria, Change in Color of Urine, No Urgency or Frequency, No Flank pain.  Musculoskeletal: No Joint Pain or Swelling, No Decreased Range of Motion, No Back Pain.  Neurologic: No Syncope, No Seizures, Muscle Weakness, Paresthesia, Vision Disturbance or Loss, No Diplopia, No Vertigo, No Difficulty Walking,  Skin: No Rash or Lesions. Psych: No Change in Mood or Affect, No  Depression or Anxiety, No Memory loss, No Confusion, or Hallucinations   Past Medical History  Diagnosis Date  . Barrett esophagus 09/2003  . GERD (gastroesophageal reflux disease)   . Hemorrhoids   . Diverticulosis   . Hyperlipidemia   . Hypertension   . Von Willebrand disease   . PUD (peptic ulcer disease)   . Hearing loss   . GI AVM (gastrointestinal arteriovenous vascular malformation)   . History of prostatitis   . Hemorrhoids   . Macular degeneration, wet     receiving intra-ocular injections ([facility])  . Back pain     improved with gabapentin as of 2015      Past Surgical History  Procedure Laterality Date  . Inguinal hernia repair  2001  . Laparoscopic appendectomy N/A 07/07/2014    Procedure: APPENDECTOMY LAPAROSCOPIC;  Surgeon: Manus Rudd, MD;  Location: [facility] OR;  Service: General;  Laterality: N/A;       Prior to Admission medications   Medication Sig Start Date End Date Taking? Authorizing Provider  acetaminophen (TYLENOL) 325 MG tablet Take 2 tablets (650 mg total) by mouth every 6 (six) hours as needed for mild pain, moderate pain, fever or headache (or Fever >/= 101). 07/15/14  Yes Elease Etienne, MD  aspirin EC 81 MG tablet Take 81 mg by mouth every evening.   Yes Historical Provider, MD  cetirizine (ZYRTEC) 10 MG tablet Take 10 mg by mouth daily as needed for allergies.    Yes Historical Provider, MD  diltiazem (DILACOR XR) 240 MG 24 hr capsule Take 1 capsule (240 mg total) by mouth daily. 08/12/13  Yes Rosalyn Gess  Norins, MD  fluticasone (FLONASE) 50 MCG/ACT nasal spray Place 2 sprays into the nose daily as needed for allergies.    Yes Historical Provider, MD  furosemide (LASIX) 40 MG tablet Take 1 tablet (40 mg total) by mouth daily. 05/13/14  Yes Joaquim NamGraham S Duncan, MD  gabapentin (NEURONTIN) 300 MG capsule Take 1 capsule (300 mg total) by mouth 2 (two) times daily.   Yes Joaquim NamGraham S Duncan, MD  gemfibrozil (LOPID) 600 MG tablet TAKE 1 TABLET BY MOUTH TWICE A  DAY 07/31/14  Yes Joaquim NamGraham S Duncan, MD  Hypromellose (ARTIFICIAL TEARS OP) Apply 1 drop to eye daily as needed (itchy / watery eyes).   Yes Historical Provider, MD  loperamide (IMODIUM) 2 MG capsule Take 1 capsule (2 mg total) by mouth 3 (three) times daily as needed for diarrhea or loose stools. 07/15/14  Yes Elease EtienneAnand D Hongalgi, MD  metoprolol succinate (TOPROL-XL) 25 MG 24 hr tablet TAKE ONE TABLET BY MOUTH EVERY NIGHT AT BEDTIME 08/11/14  Yes Joaquim NamGraham S Duncan, MD  Multiple Vitamins-Minerals (OCUVITE ADULT 50+ PO) Take 1 capsule by mouth daily.     Yes Historical Provider, MD  pantoprazole (PROTONIX) 40 MG tablet Take 1 tablet (40 mg total) by mouth 2 (two) times daily before a meal. 07/15/14 07/15/15 Yes Elease EtienneAnand D Hongalgi, MD  potassium chloride SA (K-DUR,KLOR-CON) 20 MEQ tablet TAKE 1 TABLET BY MOUTH DAILY 07/03/14  Yes Joaquim NamGraham S Duncan, MD  ranitidine (ZANTAC) 150 MG tablet Take 1 tablet (150 mg total) by mouth 2 (two) times daily. 08/12/13  Yes Jacques NavyMichael E Norins, MD  sucralfate (CARAFATE) 1 G tablet One tablet by mouth before laying down prior to nap or bedtime. Patient taking differently: Take 1 g by mouth 2 (two) times daily.  05/28/14  Yes Joaquim NamGraham S Duncan, MD  tamsulosin (FLOMAX) 0.4 MG CAPS capsule Take 1 capsule (0.4 mg total) by mouth daily. 07/15/14  Yes Elease EtienneAnand D Hongalgi, MD  Bevacizumab (AVASTIN IV) Place into the right eye See admin instructions. Every 7 weeks    Historical Provider, MD  diltiazem (TIAZAC) 240 MG 24 hr capsule TAKE ONE CAPSULE BY MOUTH DAILY Patient not taking: Reported on 08/16/2014 08/11/14   Joaquim NamGraham S Duncan, MD      Allergies  Allergen Reactions  . Atorvastatin     REACTION: muscles tense up   . Codeine Nausea And Vomiting  . Lisinopril Swelling    Lips and face swelling  . Statins Other (See Comments)    Muscle tense up     Social History:  reports that he has quit smoking. His smoking use included Cigarettes. He has a 36 pack-year smoking history. He has  never used smokeless tobacco. He reports that he does not drink alcohol or use illicit drugs.     Family History  Problem Relation Age of Onset  . Heart attack Father   . Coronary artery disease Father   . Hypertension Mother   . Pancreatic cancer Mother     Died at 4286.  . Cancer Mother     Pancreatic cancer  . Arthritis Sister   . Hyperlipidemia Sister   . Prostate cancer Brother   . Cancer Brother     Bone Cancer secondary to Prostate Cancer  . Diabetes Neg Hx        Physical Exam:  GEN: Elderly Agitated Obese  80 y.o. Caucasian male examined  and in no acute distress; cooperative with exam Filed Vitals:   08/16/14 0430 08/16/14 0445 08/16/14 0500  08/16/14 0548  BP: 162/71  133/75 135/58  Pulse: 78 78 77 78  Temp:    98.2 F (36.8 C)  TempSrc:    Oral  Resp:    16  Height:    5\' 9"  (1.753 m)  Weight:    71.169 kg (156 lb 14.4 oz)  SpO2: 91% 95% 93% 96%   Blood pressure 135/58, pulse 78, temperature 98.2 F (36.8 C), temperature source Oral, resp. rate 16, height 5\' 9"  (1.753 m), weight 71.169 kg (156 lb 14.4 oz), SpO2 96 %. PSYCH: He is alert and oriented x4; HEENT: Normocephalic and Atraumatic, Mucous membranes pink; PERRLA; EOM intact; Fundi:  Benign;  No scleral icterus, Nares: Patent, Oropharynx: Clear, Edentulous or Fair Dentition,    Neck:  FROM, No Cervical Lymphadenopathy nor Thyromegaly or Carotid Bruit; No JVD; Breasts:: Not examined CHEST WALL: No tenderness CHEST: Normal respiration, clear to auscultation bilaterally HEART: Regular rate and rhythm; no murmurs rubs or gallops BACK: No kyphosis or scoliosis; No CVA tenderness ABDOMEN: Positive Bowel Sounds, Obese, Mildly tender in the Lower Mid to LLQ ABD , Soft , No Masses, No Organomegaly, No Pannus; No Intertriginous candida. Rectal Exam: Not done EXTREMITIES: No Cyanosis, Clubbing, or Edema; No Ulcerations. Genitalia: not examined PULSES: 2+ and symmetric SKIN: Normal hydration no rash or  ulceration CNS:  Alert and Oriented x 4, No Focal Deficits Vascular: pulses palpable throughout    Labs on Admission:  Basic Metabolic Panel:  Recent Labs Lab 08/15/14 2203 08/16/14 0810  NA 135 139  K 3.4* 3.6  CL 100 105  CO2 25 26  GLUCOSE 113* 90  BUN 12 11  CREATININE 1.37* 1.04  CALCIUM 8.8 8.4   Liver Function Tests:  Recent Labs Lab 08/15/14 2203  AST 18  ALT 9  ALKPHOS 97  BILITOT 0.3  PROT 7.0  ALBUMIN 3.1*   No results for input(s): LIPASE, AMYLASE in the last 168 hours. No results for input(s): AMMONIA in the last 168 hours. CBC:  Recent Labs Lab 08/15/14 2203 08/16/14 0810  WBC 9.6 6.8  NEUTROABS 5.9  --   HGB 12.2* 11.0*  HCT 38.2* 34.7*  MCV 84.5 84.2  PLT 300 277   Cardiac Enzymes: No results for input(s): CKTOTAL, CKMB, CKMBINDEX, TROPONINI in the last 168 hours.  BNP (last 3 results) No results for input(s): PROBNP in the last 8760 hours. CBG: No results for input(s): GLUCAP in the last 168 hours.  Radiological Exams on Admission: Dg Chest 2 View  08/15/2014   CLINICAL DATA:  Sudden onset chest pain and pain beneath the diaphragm.  EXAM: CHEST  2 VIEW  COMPARISON:  07/13/2014  FINDINGS: Normal cardiac silhouette. There is a fine linear pattern of the lung base as. No focal consolidation. No pneumothorax. No osseous abnormality  IMPRESSION: Fine linear opacities at lung bases could represent bronchitis, atelectasis, less likely infiltrate.   Electronically Signed   By: Genevive Bi M.D.   On: 08/15/2014 22:09   Ct Abdomen Pelvis W Contrast  08/16/2014   CLINICAL DATA:  Lower abdominal pain for 1 week, diarrhea for 3 days. History of Barrett's esophagus, diverticulosis and prostatitis. Appendectomy July 07, 2014, inguinal hernia repairs.  EXAM: CT ABDOMEN AND PELVIS WITH CONTRAST  TECHNIQUE: Multidetector CT imaging of the abdomen and pelvis was performed using the standard protocol following bolus administration of intravenous  contrast.  CONTRAST:  80mL OMNIPAQUE IOHEXOL 300 MG/ML  SOLN  COMPARISON:  CT of the abdomen and pelvis July 06, 2014  FINDINGS: LUNG BASES: Patchy ground-glass opacities and centrilobular nodules LEFT lower lobe, new.  SOLID ORGANS: The liver, gallbladder, pancreas and adrenal glands are unremarkable. Calcified splenic granulomas.  GASTROINTESTINAL TRACT: Moderate hiatal hernia. The stomach, small and large bowel are normal in course and caliber. Moderate sigmoid diverticulosis, superimposed focal acute diverticulitis of the proximal sigmoid colon (axial 65/93). Status post appendectomy.  KIDNEYS/ URINARY TRACT: Kidneys are orthotopic, demonstrating symmetric enhancement. No nephrolithiasis, hydronephrosis or solid renal masses. 10 mm cyst lower pole of RIGHT kidney. Too small to characterize hypodensities RIGHT kidney. The unopacified ureters are normal in course and caliber. Delayed imaging through the kidneys demonstrates symmetric prompt contrast excretion within the proximal urinary collecting system. Urinary bladder is partially distended and unremarkable.  PERITONEUM/RETROPERITONEUM: No intraperitoneal free fluid nor free air. Ectatic infrarenal aorta to 2.8 cm, moderate calcific atherosclerosis. No lymphadenopathy by CT size criteria. Prostate is 4.9 x 3.9 cm.  SOFT TISSUE/OSSEOUS STRUCTURES: Nonsuspicious. Moderate LEFT fat containing inguinal hernia. Severe L5-S1 degenerative disc.  IMPRESSION: Mild uncomplicated acute sigmoid diverticulitis.  Interval appendectomy.  LEFT lower lobe probable pneumonia. Recommend follow-up chest radiograph after treatment to verify improvement.  Ectatic abdominal aorta at risk for aneurysm development. Recommend followup by US in 5 years. This recommendation follows ACR consensus guidelines: White Paper of the ACR Incidental Findings Committee II on Vascular Findings. J Am Coll Radiol 2013; 10:789-794.   Electronically Signed   By: Awilda Metroourtnay  Bloomer   On: 08/16/2014  02:28     EKG: Independently reviewed.    Assessment/Plan:   80 y.o. male with   Principal Problem:  1.    Acute diverticulitis/Diverticulitis, colon   IV Levaquin and Metronidazole   Pain Control PRN       Active Problems:   2.   AP (abdominal pain)   Pain Control PRN    3.   Von Willebrand's disease   Monitor     4.   Essential hypertension   Continue Diltiazem, and Metoprolol as BP tolerates   Monitor BPs     5.   Hypercholesteremia   Continue Gemfibrizil, and        6.  DVT Prophylaxis    SCDs      Code Status:     FULL CODE Family Communication:   Family wife and Son at Bedside Disposition Plan:  Inpatient       Time spent:  1560 Minutes  Ron ParkerJENKINS,Skyra Crichlow C [facility] Pager [phone]   If 7AM -7PM Please Contact the Day Rounding Team MD for [facility]  If 7PM-7AM, Please Contact Night-Floor Coverage  [email or web address] Password [email or web address] 08/16/2014, 10:27 AM

## 2014-08-16 NOTE — Progress Notes (Signed)
ANTIBIOTIC CONSULT NOTE - INITIAL  Pharmacy Consult for Levaquin  Indication: Intra-abdominal infection  Allergies  Allergen Reactions  . Atorvastatin     REACTION: muscles tense up   . Codeine Nausea And Vomiting  . Lisinopril Swelling    Lips and face swelling  . Statins Other (See Comments)    Muscle tense up    Patient Measurements: Height: 5\' 9"  (175.3 cm) Weight: 156 lb (70.761 kg) IBW/kg (Calculated) : 70.7  Vital Signs: Temp: 98 F (36.7 C) (12/25 2128) Temp Source: Oral (12/25 2128) BP: 141/80 mmHg (12/26 0330) Pulse Rate: 80 (12/26 0330) Intake/Output from previous day: 12/25 0701 - 12/26 0700 In: -  Out: 250 [Urine:250] Intake/Output from this shift: Total I/O In: -  Out: 250 [Urine:250]  Labs:  Recent Labs  08/15/14 2203  WBC 9.6  HGB 12.2*  PLT 300  CREATININE 1.37*   Estimated Creatinine Clearance: 38.7 mL/min (by C-G formula based on Cr of 1.37).  Medical History: Past Medical History  Diagnosis Date  . Barrett esophagus 09/2003  . GERD (gastroesophageal reflux disease)   . Hemorrhoids   . Diverticulosis   . Hyperlipidemia   . Hypertension   . Von Willebrand disease   . PUD (peptic ulcer disease)   . Hearing loss   . GI AVM (gastrointestinal arteriovenous vascular malformation)   . History of prostatitis   . Hemorrhoids   . Macular degeneration, wet     receiving intra-ocular injections ([facility])  . Back pain     improved with gabapentin as of 2015   Assessment: Levaquin for intra-abdominal infection. WBC WNL, CrCl ~35-40, other labs as above.   Plan:  -Levaquin 750 mg IV q48h -Flagyl per MD -Trend WBC, temp, renal function   Abran DukeLedford, Wasim Hurlbut 08/16/2014,5:05 AM

## 2014-08-16 NOTE — ED Notes (Signed)
Attempted to update pt and family on results, upset with results that I informed pt and upset with wait. Informed primary rn and MD of same. Apologized to pt and family for delays and explained that I was just attempting to help

## 2014-08-16 NOTE — Progress Notes (Signed)
   Follow Up Note  Pt admitted earlier this morning.  Seen after arrived to floor.   Patient doing okay, abdominal pain resolved Exam: CV: Regular rate and rhythm, S1-S2  Lungs: Clear to auscultation bilaterally Abd: Soft, minimal if any tenderness in left lower quadrant, nondistended, hypoactive bowel sounds Ext: No clubbing cyanosis or edema  Present on Admission:  . Diverticulitis, colon . Acute diverticulitis: First episode. IV Levaquin and Flagyl. Already on clear liquids and tolerating well, likely discharge tomorrow  . Essential hypertension: Blood pressure stable  . Hypercholesteremia . Von Willebrand's disease . AP (abdominal pain) secondary to diverticulitis yesterday :  . CAP (community acquired pneumonia): Incidentally noted. Patient was hospitalized recently for appendicitis and so technically would be treated as a healthcare associated pneumonia however   already on Levaquin and Flagyl and we'll change over to by mouth Flagyl tomorrow  . Ectatic abdominal aorta: At risk for future aneurysm, however given advanced age we'll defer to patient and PCP if follow-up in 5 years is desired

## 2014-08-16 NOTE — ED Provider Notes (Addendum)
CSN: [id number]     Arrival date & time 08/15/14  2122 History  This chart was scribe for Dione Booze, MD by Angelene Giovanni, ED Scribe. The patient was seen in room D35C/D35C and the patient's care was started at 12:25 AM.    Chief Complaint  Patient presents with  . Respiratory Distress   The history is provided by the patient. No language interpreter was used.   HPI Comments: [name] is a 80 y.o. male with an appendectomy on 07/07/2014 who presents to the Emergency Department complaining of a sharp severe lower abdominal pain. He denies that the pain radiates any where else. He reports that the pain is worse when he goes over a bump in the car. He denies nausea, fever, chills, dysurea, CP, and SOB. He reports that he had diarrhea 3 days ago and was given imodium but has not had a BM since then. He states that he has been passing gas which gives him slight relief.   Past Medical History  Diagnosis Date  . Barrett esophagus 09/2003  . GERD (gastroesophageal reflux disease)   . Hemorrhoids   . Diverticulosis   . Hyperlipidemia   . Hypertension   . Von Willebrand disease   . PUD (peptic ulcer disease)   . Hearing loss   . GI AVM (gastrointestinal arteriovenous vascular malformation)   . History of prostatitis   . Hemorrhoids   . Macular degeneration, wet     receiving intra-ocular injections ([place])  . Back pain     improved with gabapentin as of 2015   Past Surgical History  Procedure Laterality Date  . Inguinal hernia repair  2001  . Laparoscopic appendectomy N/A 07/07/2014    Procedure: APPENDECTOMY LAPAROSCOPIC;  Surgeon: Manus Rudd, MD;  Location: [facility] OR;  Service: General;  Laterality: N/A;   Family History  Problem Relation Age of Onset  . Heart attack Father   . Coronary artery disease Father   . Hypertension Mother   . Pancreatic cancer Mother     Died at 47.  . Cancer Mother     Pancreatic cancer  . Arthritis Sister   . Hyperlipidemia Sister   .  Prostate cancer Brother   . Cancer Brother     Bone Cancer secondary to Prostate Cancer  . Diabetes Neg Hx    History  Substance Use Topics  . Smoking status: Former Smoker -- 1.00 packs/day for 36 years    Types: Cigarettes  . Smokeless tobacco: Never Used     Comment: Quit in 1983  . Alcohol Use: No    Review of Systems  Constitutional: Negative for fever and chills.  Respiratory: Negative for shortness of breath.   Cardiovascular: Negative for chest pain.  Gastrointestinal: Positive for abdominal pain. Negative for nausea and vomiting.  Genitourinary: Positive for dysuria.  All other systems reviewed and are negative.     Allergies  Atorvastatin; Codeine; Lisinopril; and Statins  Home Medications   Prior to Admission medications   Medication Sig Start Date End Date Taking? Authorizing Provider  acetaminophen (TYLENOL) 325 MG tablet Take 2 tablets (650 mg total) by mouth every 6 (six) hours as needed for mild pain, moderate pain, fever or headache (or Fever >/= 101). 07/15/14   Elease Etienne, MD  aspirin EC 81 MG tablet Take 81 mg by mouth every evening.    Historical Provider, MD  Bevacizumab (AVASTIN IV) Place into the right eye See admin instructions. Every 7 weeks  Historical Provider, MD  cetirizine (ZYRTEC) 10 MG tablet Take 10 mg by mouth daily as needed for allergies.     Historical Provider, MD  diltiazem (DILACOR XR) 240 MG 24 hr capsule Take 1 capsule (240 mg total) by mouth daily. 08/12/13   Jacques NavyMichael E Norins, MD  diltiazem [facility](TIAZAC) 240 MG 24 hr capsule TAKE ONE CAPSULE BY MOUTH DAILY 08/11/14   Joaquim NamGraham S Duncan, MD  fluticasone [facility](FLONASE) 50 MCG/ACT nasal spray Place 2 sprays into the nose daily as needed for allergies.     Historical Provider, MD  furosemide (LASIX) 40 MG tablet Take 1 tablet (40 mg total) by mouth daily. 05/13/14   Joaquim NamGraham S Duncan, MD  gabapentin (NEURONTIN) 300 MG capsule Take 1 capsule (300 mg total) by mouth 2 (two) times daily.    Joaquim NamGraham  S Duncan, MD  gemfibrozil (LOPID) 600 MG tablet TAKE 1 TABLET BY MOUTH TWICE A DAY 07/31/14   Joaquim NamGraham S Duncan, MD  Hypromellose (ARTIFICIAL TEARS OP) Apply 1 drop to eye daily as needed (itchy / watery eyes).    Historical Provider, MD  loperamide (IMODIUM) 2 MG capsule Take 1 capsule (2 mg total) by mouth 3 (three) times daily as needed for diarrhea or loose stools. 07/15/14   Elease EtienneAnand D Hongalgi, MD  metoprolol succinate (TOPROL-XL) 25 MG 24 hr tablet TAKE ONE TABLET BY MOUTH EVERY NIGHT AT BEDTIME 08/11/14   Joaquim NamGraham S Duncan, MD  Multiple Vitamins-Minerals (OCUVITE ADULT 50+ PO) Take 1 capsule by mouth daily.      Historical Provider, MD  pantoprazole (PROTONIX) 40 MG tablet Take 1 tablet (40 mg total) by mouth 2 (two) times daily before a meal. 07/15/14 07/15/15  Elease EtienneAnand D Hongalgi, MD  potassium chloride SA (K-DUR,KLOR-CON) 20 MEQ tablet TAKE 1 TABLET BY MOUTH DAILY 07/03/14   Joaquim NamGraham S Duncan, MD  ranitidine (ZANTAC) 150 MG tablet Take 1 tablet (150 mg total) by mouth 2 (two) times daily. 08/12/13   Jacques NavyMichael E Norins, MD  sucralfate (CARAFATE) 1 G tablet One tablet by mouth before laying down prior to nap or bedtime. Patient taking differently: Take 1 g by mouth 2 (two) times daily.  05/28/14   Joaquim NamGraham S Duncan, MD  tamsulosin (FLOMAX) 0.4 MG CAPS capsule Take 1 capsule (0.4 mg total) by mouth daily. 07/15/14   Elease EtienneAnand D Hongalgi, MD   BP 158/68 mmHg  Pulse 69  Temp(Src) 98 F (36.7 C) (Oral)  Resp 11  Ht 5\' 9"  (1.753 m)  Wt 156 lb (70.761 kg)  BMI 23.03 kg/m2  SpO2 97% Physical Exam  Constitutional: He is oriented to person, place, and time. He appears well-developed and well-nourished. No distress.  HENT:  Head: Normocephalic and atraumatic.  Eyes: Conjunctivae and EOM are normal. Pupils are equal, round, and reactive to light.  Neck: Normal range of motion. Neck supple. No JVD present.  Cardiovascular: Normal rate, regular rhythm and normal heart sounds.   No murmur heard. Pulmonary/Chest:  Effort normal and breath sounds normal. He has no wheezes. He has no rales. He exhibits no tenderness.  Abdominal: Soft. He exhibits no mass. There is tenderness. There is no rebound and no guarding.  Moderate mid abdominal tenderness. Bowel sounds decreased  Musculoskeletal: Normal range of motion. He exhibits edema.  Lymphadenopathy:    He has no cervical adenopathy.  Neurological: He is alert and oriented to person, place, and time. No cranial nerve deficit. Coordination normal.  Skin: Skin is warm and dry. No rash noted.  Psychiatric:  He has a normal mood and affect. His behavior is normal. Judgment and thought content normal.  Nursing note and vitals reviewed.   ED Course  Procedures (including critical care time) DIAGNOSTIC STUDIES: Oxygen Saturation is 97% on RA, normal by my interpretation.    COORDINATION OF CARE: 12:32 AM- Pt advised of plan for treatment and pt agrees.    Labs Review Results for orders placed or performed during the hospital encounter of 08/15/14  CBC with Differential  Result Value Ref Range   WBC 9.6 4.0 - 10.5 K/uL   RBC 4.52 4.22 - 5.81 MIL/uL   Hemoglobin 12.2 (L) 13.0 - 17.0 g/dL   HCT 13.038.2 (L) 86.539.0 - 78.452.0 %   MCV 84.5 78.0 - 100.0 fL   MCH 27.0 26.0 - 34.0 pg   MCHC 31.9 30.0 - 36.0 g/dL   RDW 69.613.8 29.511.5 - 28.415.5 %   Platelets 300 150 - 400 K/uL   Neutrophils Relative % 61 43 - 77 %   Neutro Abs 5.9 1.7 - 7.7 K/uL   Lymphocytes Relative 26 12 - 46 %   Lymphs Abs 2.5 0.7 - 4.0 K/uL   Monocytes Relative 11 3 - 12 %   Monocytes Absolute 1.0 0.1 - 1.0 K/uL   Eosinophils Relative 2 0 - 5 %   Eosinophils Absolute 0.2 0.0 - 0.7 K/uL   Basophils Relative 0 0 - 1 %   Basophils Absolute 0.0 0.0 - 0.1 K/uL  Comprehensive metabolic panel  Result Value Ref Range   Sodium 135 135 - 145 mmol/L   Potassium 3.4 (L) 3.5 - 5.1 mmol/L   Chloride 100 96 - 112 mEq/L   CO2 25 19 - 32 mmol/L   Glucose, Bld 113 (H) 70 - 99 mg/dL   BUN 12 6 - 23 mg/dL    Creatinine, Ser 1.321.37 (H) 0.50 - 1.35 mg/dL   Calcium 8.8 8.4 - 44.010.5 mg/dL   Total Protein 7.0 6.0 - 8.3 g/dL   Albumin 3.1 (L) 3.5 - 5.2 g/dL   AST 18 0 - 37 U/L   ALT 9 0 - 53 U/L   Alkaline Phosphatase 97 39 - 117 U/L   Total Bilirubin 0.3 0.3 - 1.2 mg/dL   GFR calc non Af Amer 45 (L) >90 mL/min   GFR calc Af Amer 52 (L) >90 mL/min   Anion gap 10 5 - 15  Urinalysis, Routine w reflex microscopic  Result Value Ref Range   Color, Urine YELLOW YELLOW   APPearance CLEAR CLEAR   Specific Gravity, Urine 1.019 1.005 - 1.030   pH 5.0 5.0 - 8.0   Glucose, UA NEGATIVE NEGATIVE mg/dL   Hgb urine dipstick NEGATIVE NEGATIVE   Bilirubin Urine NEGATIVE NEGATIVE   Ketones, ur NEGATIVE NEGATIVE mg/dL   Protein, ur NEGATIVE NEGATIVE mg/dL   Urobilinogen, UA 0.2 0.0 - 1.0 mg/dL   Nitrite NEGATIVE NEGATIVE   Leukocytes, UA NEGATIVE NEGATIVE    Imaging Review Dg Chest 2 View  08/15/2014   CLINICAL DATA:  Sudden onset chest pain and pain beneath the diaphragm.  EXAM: CHEST  2 VIEW  COMPARISON:  07/13/2014  FINDINGS: Normal cardiac silhouette. There is a fine linear pattern of the lung base as. No focal consolidation. No pneumothorax. No osseous abnormality  IMPRESSION: Fine linear opacities at lung bases could represent bronchitis, atelectasis, less likely infiltrate.   Electronically Signed   By: Genevive BiStewart  Edmunds M.D.   On: 08/15/2014 22:09   Ct Abdomen Pelvis W  Contrast  08/16/2014   CLINICAL DATA:  Lower abdominal pain for 1 week, diarrhea for 3 days. History of Barrett's esophagus, diverticulosis and prostatitis. Appendectomy July 07, 2014, inguinal hernia repairs.  EXAM: CT ABDOMEN AND PELVIS WITH CONTRAST  TECHNIQUE: Multidetector CT imaging of the abdomen and pelvis was performed using the standard protocol following bolus administration of intravenous contrast.  CONTRAST:  80mL OMNIPAQUE IOHEXOL 300 MG/ML  SOLN  COMPARISON:  CT of the abdomen and pelvis July 06, 2014  FINDINGS: LUNG  BASES: Patchy ground-glass opacities and centrilobular nodules LEFT lower lobe, new.  SOLID ORGANS: The liver, gallbladder, pancreas and adrenal glands are unremarkable. Calcified splenic granulomas.  GASTROINTESTINAL TRACT: Moderate hiatal hernia. The stomach, small and large bowel are normal in course and caliber. Moderate sigmoid diverticulosis, superimposed focal acute diverticulitis of the proximal sigmoid colon (axial 65/93). Status post appendectomy.  KIDNEYS/ URINARY TRACT: Kidneys are orthotopic, demonstrating symmetric enhancement. No nephrolithiasis, hydronephrosis or solid renal masses. 10 mm cyst lower pole of RIGHT kidney. Too small to characterize hypodensities RIGHT kidney. The unopacified ureters are normal in course and caliber. Delayed imaging through the kidneys demonstrates symmetric prompt contrast excretion within the proximal urinary collecting system. Urinary bladder is partially distended and unremarkable.  PERITONEUM/RETROPERITONEUM: No intraperitoneal free fluid nor free air. Ectatic infrarenal aorta to 2.8 cm, moderate calcific atherosclerosis. No lymphadenopathy by CT size criteria. Prostate is 4.9 x 3.9 cm.  SOFT TISSUE/OSSEOUS STRUCTURES: Nonsuspicious. Moderate LEFT fat containing inguinal hernia. Severe L5-S1 degenerative disc.  IMPRESSION: Mild uncomplicated acute sigmoid diverticulitis.  Interval appendectomy.  LEFT lower lobe probable pneumonia. Recommend follow-up chest radiograph after treatment to verify improvement.  Ectatic abdominal aorta at risk for aneurysm development. Recommend followup by [place] in 5 years. This recommendation follows ACR consensus guidelines: White Paper of the ACR Incidental Findings Committee II on Vascular Findings. J Am Coll Radiol 2013; 10:789-794.   Electronically Signed   By: Awilda Metro   On: 08/16/2014 02:28    MDM   Final diagnoses:  Abdominal pain, unspecified abdominal location  Diverticulitis, colon    Abdominal pain in  patient with the recent appendectomy. There is clearly tenderness but exam is nonspecific. Laboratory workup was also unremarkable with that WBC of 9.6. It was elected to send him for CT scan to evaluate for possible complication of recent appendectomy. CT scan showed no complications of appendectomy but did show evidence of diverticulitis. There is also a questionable finding of a left basilar pneumonia. Clinically, patient does not have pneumonia. He has no fever and no cough and no dyspnea. Pneumonia was not suspected at time of presentation and I do not feel he has pneumonia even with the questionable finding on CT scan. He is started on antibiotics for diverticulitis. He is given levofloxacin and metronidazole. Levofloxacin was chosen on the remote possibility that the questionable finding on CT scan might actually represent a pneumonia. Case is discussed with Dr. Lovell Sheehan of [facility] who agrees to admit the patient.  I personally performed the services described in this documentation, which was scribed in my presence. The recorded information has been reviewed and is accurate.    Dione Booze, MD 08/16/14 1610  Dione Booze, MD 08/16/14 [phone]

## 2014-08-17 MED ORDER — METRONIDAZOLE 500 MG PO TABS: 500.0000 mg | ORAL_TABLET | Freq: Three times a day (TID) | ORAL | Status: DC

## 2014-08-17 MED ORDER — PROMETHAZINE HCL 12.5 MG PO TABS
12.5000 mg | ORAL_TABLET | Freq: Four times a day (QID) | ORAL | Status: DC | PRN
Start: 2014-08-17 — End: 2014-09-11

## 2014-08-17 MED ORDER — LEVOFLOXACIN 500 MG PO TABS: 500.0000 mg | ORAL_TABLET | Freq: Every day | ORAL | Status: AC

## 2014-08-17 NOTE — Progress Notes (Signed)
Patient discharged to home with instructions given to wife who was at bedside.

## 2014-08-17 NOTE — Discharge Summary (Signed)
Discharge Summary  [name] ZOX:[id number] DOB: 1926-02-16  PCP: [name], MD  Admit date: 08/15/2014 Discharge date: 08/17/2014  Time spent: 25 minutes  Recommendations for Outpatient Follow-up:  1. Patient will follow-up with his PCP, Dr. Para March next week with an already scheduled visit 2. New medication: Levaquin 500 by mouth daily x6 days 3. New medication: Phenergan 12.5 by mouth every 6 hours when necessary for nausea 4. New medication: Flagyl 500 mg every 8 hours 6 days   Discharge Diagnoses:  Active Hospital Problems   Diagnosis Date Noted  . Acute diverticulitis 08/16/2014  . Diverticulitis, colon 08/16/2014  . AP (abdominal pain) 08/16/2014  . CAP (community acquired pneumonia) 08/16/2014  . Ectatic abdominal aorta 08/16/2014  . Von Willebrand's disease 08/23/2008  . Essential hypertension 05/15/2007  . Hypercholesteremia 05/15/2007    Resolved Hospital Problems   Diagnosis Date Noted Date Resolved  No resolved problems to display.    Discharge Condition: Improved, being discharged home  Diet recommendation: Low-sodium  Filed Weights   08/15/14 2128 08/16/14 0548  Weight: 70.761 kg (156 lb) 71.169 kg (156 lb 14.4 oz)    History of present illness:  80 year old male with past medical history of hypertension and von Willebrand's disease recently discharged from the hospital for appendicitis status post appendix removal came back with nausea and left lower quadrant abdominal pain and found to have mild diverticulitis on scan. Patient's white blood cell count was normal and he was started on Levaquin and Flagyl. CT scan also incidentally noted a basilar pneumonia.  Hospital Course:  Principal Problem:   Acute diverticulitis: Patient to be doing well and not toxic. Diet advanced and by following day, patient able to tolerate solid food. Be discharged home with a 7 day course of Flagyl and Levaquin. Education given for patient Active Problems:  Hypercholesteremia: Stable   Von Willebrand's disease: Stable   Essential hypertension: Stable    CAP (community acquired pneumonia): Levaquin replaced Cipro as fluoroquinolone given presence of pneumonia. Technically given appendix removal, this can be treated as a healthcare associated pneumonia, however given the quick and rapid turnaround, patient be discharged on Levaquin regardless    Ectatic abdominal aorta: CT scan noted diverticulosis also noted ectatic abdominal aorta which put patient at eventual risk for developing abdominal aortic aneurysm. However given his advanced age of already being 85, guidelines recommend a 5 year ultrasound for follow-up.   Procedures:  None  Consultations:  None  Discharge Exam: BP 145/68 mmHg  Pulse 75  Temp(Src) 98.5 F (36.9 C) (Oral)  Resp 17  Ht 5\' 9"  (1.753 m)  Wt 71.169 kg (156 lb 14.4 oz)  BMI 23.16 kg/m2  SpO2 96%  General: Alert and oriented 3, no acute distress Cardiovascular: Regular rate and rhythm, S1-S2 Respiratory: Clear to auscultation bilaterally  Discharge Instructions You were cared for by a hospitalist during your hospital stay. If you have any questions about your discharge medications or the care you received while you were in the hospital after you are discharged, you can call the unit and asked to speak with the hospitalist on call if the hospitalist that took care of you is not available. Once you are discharged, your primary care physician will handle any further medical issues. Please note that NO REFILLS for any discharge medications will be authorized once you are discharged, as it is imperative that you return to your primary care physician (or establish a relationship with a primary care physician if you do not have  one) for your aftercare needs so that they can reassess your need for medications and monitor your lab values.  Discharge Instructions    Diet - low sodium heart healthy    Complete by:  As  directed      Increase activity slowly    Complete by:  As directed             Medication List    STOP taking these medications        diltiazem 240 MG 24 hr capsule  Commonly known as:  TIAZAC      TAKE these medications        acetaminophen 325 MG tablet  Commonly known as:  TYLENOL  Take 2 tablets (650 mg total) by mouth every 6 (six) hours as needed for mild pain, moderate pain, fever or headache (or Fever >/= 101).     ARTIFICIAL TEARS OP  Apply 1 drop to eye daily as needed (itchy / watery eyes).     aspirin EC 81 MG tablet  Take 81 mg by mouth every evening.     AVASTIN IV  Place into the right eye See admin instructions. Every 7 weeks     cetirizine 10 MG tablet  Commonly known as:  ZYRTEC  Take 10 mg by mouth daily as needed for allergies.     diltiazem 240 MG 24 hr capsule  Commonly known as:  DILACOR XR  Take 1 capsule (240 mg total) by mouth daily.  Notes to Patient:  Ok to take this medicine.  If your instructions say to also stop this medicine, ignore that.  The medicine was listed twice in the computer, so I stopped the extra one.     fluticasone 50 MCG/ACT nasal spray  Commonly known as:  FLONASE  Place 2 sprays into the nose daily as needed for allergies.     furosemide 40 MG tablet  Commonly known as:  LASIX  Take 1 tablet (40 mg total) by mouth daily.     gabapentin 300 MG capsule  Commonly known as:  NEURONTIN  Take 1 capsule (300 mg total) by mouth 2 (two) times daily.     gemfibrozil 600 MG tablet  Commonly known as:  LOPID  TAKE 1 TABLET BY MOUTH TWICE A DAY     levofloxacin 500 MG tablet  Commonly known as:  LEVAQUIN  Take 1 tablet (500 mg total) by mouth daily.     loperamide 2 MG capsule  Commonly known as:  IMODIUM  Take 1 capsule (2 mg total) by mouth 3 (three) times daily as needed for diarrhea or loose stools.     metoprolol succinate 25 MG 24 hr tablet  Commonly known as:  TOPROL-XL  TAKE ONE TABLET BY MOUTH EVERY NIGHT  AT BEDTIME     metroNIDAZOLE 500 MG tablet  Commonly known as:  FLAGYL  Take 1 tablet (500 mg total) by mouth 3 (three) times daily.     OCUVITE ADULT 50+ PO  Take 1 capsule by mouth daily.     pantoprazole 40 MG tablet  Commonly known as:  PROTONIX  Take 1 tablet (40 mg total) by mouth 2 (two) times daily before a meal.     potassium chloride SA 20 MEQ tablet  Commonly known as:  K-DUR,KLOR-CON  TAKE 1 TABLET BY MOUTH DAILY     promethazine 12.5 MG tablet  Commonly known as:  PHENERGAN  Take 1 tablet (12.5 mg total) by mouth every 6 (six)  hours as needed for nausea or vomiting.     ranitidine 150 MG tablet  Commonly known as:  ZANTAC  Take 1 tablet (150 mg total) by mouth 2 (two) times daily.     sucralfate 1 G tablet  Commonly known as:  CARAFATE  One tablet by mouth before laying down prior to nap or bedtime.     tamsulosin 0.4 MG Caps capsule  Commonly known as:  FLOMAX  Take 1 capsule (0.4 mg total) by mouth daily.       Allergies  Allergen Reactions  . Atorvastatin     REACTION: muscles tense up   . Codeine Nausea And Vomiting  . Lisinopril Swelling    Lips and face swelling  . Statins Other (See Comments)    Muscle tense up       Follow-up Information    Follow up with Crawford GivensGraham Duncan, MD.   [facility]   Contact information:   [address] [place] [id number] [phone]        The results of significant diagnostics from this hospitalization (including imaging, microbiology, ancillary and laboratory) are listed below for reference.    Significant Diagnostic Studies: Dg Chest 2 View  08/15/2014   CLINICAL DATA:  Sudden onset chest pain and pain beneath the diaphragm.  EXAM: CHEST  2 VIEW  COMPARISON:  07/13/2014  FINDINGS: Normal cardiac silhouette. There is a fine linear pattern of the lung base as. No focal consolidation. No pneumothorax. No osseous abnormality  IMPRESSION: Fine linear opacities at lung bases could  represent bronchitis, atelectasis, less likely infiltrate.   Electronically Signed   By: Genevive BiStewart  Edmunds M.D.   On: 08/15/2014 22:09   Ct Abdomen Pelvis W Contrast  08/16/2014   CLINICAL DATA:  Lower abdominal pain for 1 week, diarrhea for 3 days. History of Barrett's esophagus, diverticulosis and prostatitis. Appendectomy July 07, 2014, inguinal hernia repairs.  EXAM: CT ABDOMEN AND PELVIS WITH CONTRAST  TECHNIQUE: Multidetector CT imaging of the abdomen and pelvis was performed using the standard protocol following bolus administration of intravenous contrast.  CONTRAST:  80mL OMNIPAQUE IOHEXOL 300 MG/ML  SOLN  COMPARISON:  CT of the abdomen and pelvis July 06, 2014  FINDINGS: LUNG BASES: Patchy ground-glass opacities and centrilobular nodules LEFT lower lobe, new.  SOLID ORGANS: The liver, gallbladder, pancreas and adrenal glands are unremarkable. Calcified splenic granulomas.  GASTROINTESTINAL TRACT: Moderate hiatal hernia. The stomach, small and large bowel are normal in course and caliber. Moderate sigmoid diverticulosis, superimposed focal acute diverticulitis of the proximal sigmoid colon (axial 65/93). Status post appendectomy.  KIDNEYS/ URINARY TRACT: Kidneys are orthotopic, demonstrating symmetric enhancement. No nephrolithiasis, hydronephrosis or solid renal masses. 10 mm cyst lower pole of RIGHT kidney. Too small to characterize hypodensities RIGHT kidney. The unopacified ureters are normal in course and caliber. Delayed imaging through the kidneys demonstrates symmetric prompt contrast excretion within the proximal urinary collecting system. Urinary bladder is partially distended and unremarkable.  PERITONEUM/RETROPERITONEUM: No intraperitoneal free fluid nor free air. Ectatic infrarenal aorta to 2.8 cm, moderate calcific atherosclerosis. No lymphadenopathy by CT size criteria. Prostate is 4.9 x 3.9 cm.  SOFT TISSUE/OSSEOUS STRUCTURES: Nonsuspicious. Moderate LEFT fat containing inguinal  hernia. Severe L5-S1 degenerative disc.  IMPRESSION: Mild uncomplicated acute sigmoid diverticulitis.  Interval appendectomy.  LEFT lower lobe probable pneumonia. Recommend follow-up chest radiograph after treatment to verify improvement.  Ectatic abdominal aorta at risk for aneurysm development. Recommend followup by US in 5 years. This recommendation  follows ACR consensus guidelines: White Paper of the ACR Incidental Findings Committee II on Vascular Findings. J Am Coll Radiol 2013; 10:789-794.   Electronically Signed   By: Awilda Metro   On: 08/16/2014 02:28    Microbiology: No results found for this or any previous visit (from the past 240 hour(s)).   Labs: Basic Metabolic Panel:  Recent Labs Lab 08/15/14 2203 08/16/14 0810  NA 135 139  K 3.4* 3.6  CL 100 105  CO2 25 26  GLUCOSE 113* 90  BUN 12 11  CREATININE 1.37* 1.04  CALCIUM 8.8 8.4   Liver Function Tests:  Recent Labs Lab 08/15/14 2203  AST 18  ALT 9  ALKPHOS 97  BILITOT 0.3  PROT 7.0  ALBUMIN 3.1*   No results for input(s): LIPASE, AMYLASE in the last 168 hours. No results for input(s): AMMONIA in the last 168 hours. CBC:  Recent Labs Lab 08/15/14 2203 08/16/14 0810  WBC 9.6 6.8  NEUTROABS 5.9  --   HGB 12.2* 11.0*  HCT 38.2* 34.7*  MCV 84.5 84.2  PLT 300 277   Cardiac Enzymes: No results for input(s): CKTOTAL, CKMB, CKMBINDEX, TROPONINI in the last 168 hours. BNP: BNP (last 3 results) No results for input(s): PROBNP in the last 8760 hours. CBG: No results for input(s): GLUCAP in the last 168 hours.     Signed:  Hollice Espy  [facility] 08/17/2014, 2:04 PM

## 2014-08-17 NOTE — Discharge Instructions (Signed)

## 2014-08-19 ENCOUNTER — Encounter: Payer: Self-pay | Admitting: Family Medicine

## 2014-08-19 ENCOUNTER — Ambulatory Visit (INDEPENDENT_AMBULATORY_CARE_PROVIDER_SITE_OTHER): Payer: Medicare HMO | Admitting: Family Medicine

## 2014-08-19 ENCOUNTER — Telehealth: Payer: Self-pay | Admitting: Family Medicine

## 2014-08-19 VITALS — BP 128/70 | HR 88 | Temp 97.8°F | Ht 69.0 in | Wt 154.5 lb

## 2014-08-19 DIAGNOSIS — E7800 Pure hypercholesterolemia, unspecified: Secondary | ICD-10-CM

## 2014-08-19 DIAGNOSIS — K5732 Diverticulitis of large intestine without perforation or abscess without bleeding: Secondary | ICD-10-CM

## 2014-08-19 DIAGNOSIS — Z0000 Encounter for general adult medical examination without abnormal findings: Secondary | ICD-10-CM

## 2014-08-19 DIAGNOSIS — Z7189 Other specified counseling: Secondary | ICD-10-CM

## 2014-08-19 DIAGNOSIS — R413 Other amnesia: Secondary | ICD-10-CM

## 2014-08-19 DIAGNOSIS — K5792 Diverticulitis of intestine, part unspecified, without perforation or abscess without bleeding: Secondary | ICD-10-CM

## 2014-08-19 DIAGNOSIS — K5730 Diverticulosis of large intestine without perforation or abscess without bleeding: Secondary | ICD-10-CM

## 2014-08-19 DIAGNOSIS — I10 Essential (primary) hypertension: Secondary | ICD-10-CM

## 2014-08-19 DIAGNOSIS — J189 Pneumonia, unspecified organism: Secondary | ICD-10-CM

## 2014-08-19 LAB — CBC WITH DIFFERENTIAL/PLATELET
Basophils Absolute: 0.1 10*3/uL (ref 0.0–0.1)
Basophils Relative: 0.6 % (ref 0.0–3.0)
Eosinophils Absolute: 0.2 10*3/uL (ref 0.0–0.7)
Eosinophils Relative: 2.5 % (ref 0.0–5.0)
HCT: 42 % (ref 39.0–52.0)
Hemoglobin: 13.6 g/dL (ref 13.0–17.0)
Lymphocytes Relative: 25 % (ref 12.0–46.0)
Lymphs Abs: 2.4 10*3/uL (ref 0.7–4.0)
MCHC: 32.4 g/dL (ref 30.0–36.0)
MCV: 84.3 fl (ref 78.0–100.0)
Monocytes Absolute: 0.9 10*3/uL (ref 0.1–1.0)
Monocytes Relative: 9.7 % (ref 3.0–12.0)
Neutro Abs: 6 10*3/uL (ref 1.4–7.7)
Neutrophils Relative %: 62.2 % (ref 43.0–77.0)
Platelets: 343 10*3/uL (ref 150.0–400.0)
RBC: 4.98 Mil/uL (ref 4.22–5.81)
RDW: 14.9 % (ref 11.5–15.5)
WBC: 9.6 10*3/uL (ref 4.0–10.5)

## 2014-08-19 LAB — LIPID PANEL
Cholesterol: 212 mg/dL — ABNORMAL HIGH (ref 0–200)
HDL: 49.4 mg/dL (ref >39.00)
LDL Cholesterol: 146 mg/dL — ABNORMAL HIGH (ref 0–99)
NonHDL: 162.6
Total CHOL/HDL Ratio: 4
Triglycerides: 82 mg/dL (ref 0.0–149.0)
VLDL: 16.4 mg/dL (ref 0.0–40.0)

## 2014-08-19 MED ORDER — SUCRALFATE 1 G PO TABS: ORAL_TABLET | ORAL | Status: DC

## 2014-08-19 MED ORDER — DILTIAZEM HCL ER 240 MG PO CP24: 240.0000 mg | ORAL_CAPSULE | Freq: Every day | ORAL | Status: DC

## 2014-08-19 NOTE — Telephone Encounter (Signed)
emmi emailed °

## 2014-08-19 NOTE — Patient Instructions (Signed)
Go to the lab on the way out.  We'll contact you with your lab report. Finish the antibiotics and notify us if you have any more abdominal pain.  Recheck in about 1 month so I can retest your memory.  Take care.  Glad to see you.

## 2014-08-19 NOTE — Progress Notes (Signed)
Pre visit review using our clinic review tool, if applicable. No additional management support is needed unless otherwise documented below in the visit note.  I have personally reviewed the Medicare Annual Wellness questionnaire and have noted 1. The patient's medical and social history 2. Their use of alcohol, tobacco or illicit drugs 3. Their current medications and supplements 4. The patient's functional ability including ADL's, fall risks, home safety risks and hearing or visual             impairment. 5. Diet and physical activities 6. Evidence for depression or mood disorders  The patients weight, height, BMI have been recorded in the chart and visual acuity is per eye clinic.  I have made referrals, counseling and provided education to the patient based review of the above and I have provided the pt with a written personalized care plan for preventive services.  Provider list updated- see scanned forms.  Routine anticipatory guidance given to patient.  See health maintenance.  Flu 2015 Shingles prev done PNA 2011/2014 Tetanus 2011 Colonoscopy NA due to age, d/w pt.  PSA declined, this is reasonable given his age. D/w pt.  Advance directive- wife designated if patient were incapacitated.  Cognitive function addressed- see scanned forms- and if abnormal then additional documentation follows.  Oriented but 0/3 on recall.  Unclear if this is a hold over from recent illness or a true sign of memory loss.  D/w pt and wife.   Hypertension:    Using medication without problems or lightheadedness: yesChest pain with exertion: Edema:no Short of breath:no  H/o diverticulitis, recently dx'd along with CAP.  Both treatment and is back to baseline. "I feel good."  No abd pain, no fevers, not sob, no cough.   Elevated Cholesterol: Using medications without problems:yes Muscle aches: no Diet compliance:yes Exercise:some See notes on labs.   PMH and SH reviewed  Meds, vitals, and  allergies reviewed.   ROS: See HPI.  Otherwise negative.    GEN: nad, alert and oriented but 0/3 on recall.  Unclear if this is a hold over from recent illness or a true sign of memory loss.  D/w pt.  See plan.  HEENT: mucous membranes moist NECK: supple w/o LA CV: rrr. PULM: ctab, no inc wob ABD: soft, +bs EXT: no edema SKIN: no acute rash

## 2014-08-21 ENCOUNTER — Encounter: Payer: Self-pay | Admitting: Family Medicine

## 2014-08-21 DIAGNOSIS — R413 Other amnesia: Secondary | ICD-10-CM | POA: Insufficient documentation

## 2014-08-21 DIAGNOSIS — Z7189 Other specified counseling: Secondary | ICD-10-CM | POA: Insufficient documentation

## 2014-08-21 NOTE — Assessment & Plan Note (Signed)
See notes on labs.  No change in meds.

## 2014-08-21 NOTE — Assessment & Plan Note (Signed)
Oriented but 0/3 on recall.  Unclear if this is a hold over from recent illness or a true sign of memory loss.  D/w pt and wife.  Recheck in 1 month. He agrees.  We can do full MMSE at that point.

## 2014-08-21 NOTE — Assessment & Plan Note (Signed)
Sx resolved, finish abx and f/u prn.  ctab today.

## 2014-08-21 NOTE — Assessment & Plan Note (Signed)
Flu 2015 Shingles prev done PNA 2011/2014 Tetanus 2011 Colonoscopy NA due to age, d/w pt.  PSA declined, this is reasonable given his age. D/w pt.  Advance directive- wife designated if patient were incapacitated.  Cognitive function addressed- see scanned forms- and if abnormal then additional documentation follows.  Oriented but 0/3 on recall.  Unclear if this is a hold over from recent illness or a true sign of memory loss.  D/w pt and wife.

## 2014-08-21 NOTE — Assessment & Plan Note (Signed)
Controlled, continue as is.  D/w pt.  

## 2014-08-21 NOTE — Assessment & Plan Note (Signed)
Advance directive- wife designated if patient were incapacitated.  

## 2014-08-21 NOTE — Assessment & Plan Note (Signed)
Resolved, doing well, continue as is.  He'll finish his abx and f/u prn.  Hospital course d/w pt and wife, along with diverticulitis path/phys.

## 2014-08-30 ENCOUNTER — Other Ambulatory Visit: Payer: Self-pay | Admitting: Family Medicine

## 2014-09-01 NOTE — Telephone Encounter (Signed)
Sent. Thanks.   

## 2014-09-01 NOTE — Telephone Encounter (Signed)
Electronic refill request.   Last Filled:    30 tablet 1 RF 08/19/2014  But listed as No Print.  Please advise.

## 2014-09-05 ENCOUNTER — Other Ambulatory Visit: Payer: Self-pay | Admitting: *Deleted

## 2014-09-05 MED ORDER — RANITIDINE HCL 150 MG PO TABS: 150.0000 mg | ORAL_TABLET | Freq: Two times a day (BID) | ORAL | Status: DC

## 2014-09-11 ENCOUNTER — Encounter: Payer: Self-pay | Admitting: Family Medicine

## 2014-09-11 ENCOUNTER — Ambulatory Visit (INDEPENDENT_AMBULATORY_CARE_PROVIDER_SITE_OTHER): Payer: Medicare HMO | Admitting: Family Medicine

## 2014-09-11 VITALS — BP 130/60 | HR 67 | Temp 98.2°F | Wt 158.5 lb

## 2014-09-11 DIAGNOSIS — R1030 Lower abdominal pain, unspecified: Secondary | ICD-10-CM

## 2014-09-11 DIAGNOSIS — K5792 Diverticulitis of intestine, part unspecified, without perforation or abscess without bleeding: Secondary | ICD-10-CM

## 2014-09-11 LAB — BASIC METABOLIC PANEL
BUN: 20 mg/dL (ref 6–23)
CO2: 28 mEq/L (ref 19–32)
Calcium: 9.1 mg/dL (ref 8.4–10.5)
Chloride: 104 mEq/L (ref 96–112)
Creatinine, Ser: 1.13 mg/dL (ref 0.40–1.50)
GFR: 65.26 mL/min (ref >60.00)
Glucose, Bld: 109 mg/dL — ABNORMAL HIGH (ref 70–99)
Potassium: 3.9 mEq/L (ref 3.5–5.1)
Sodium: 139 mEq/L (ref 135–145)

## 2014-09-11 LAB — CBC WITH DIFFERENTIAL/PLATELET
Basophils Absolute: 0 10*3/uL (ref 0.0–0.1)
Basophils Relative: 0.5 % (ref 0.0–3.0)
Eosinophils Absolute: 0.3 10*3/uL (ref 0.0–0.7)
Eosinophils Relative: 5.5 % — ABNORMAL HIGH (ref 0.0–5.0)
HCT: 39.8 % (ref 39.0–52.0)
Hemoglobin: 13.1 g/dL (ref 13.0–17.0)
Lymphocytes Relative: 39.1 % (ref 12.0–46.0)
Lymphs Abs: 2.4 10*3/uL (ref 0.7–4.0)
MCHC: 32.8 g/dL (ref 30.0–36.0)
MCV: 81.4 fl (ref 78.0–100.0)
Monocytes Absolute: 0.7 10*3/uL (ref 0.1–1.0)
Monocytes Relative: 11.4 % (ref 3.0–12.0)
Neutro Abs: 2.7 10*3/uL (ref 1.4–7.7)
Neutrophils Relative %: 43.5 % (ref 43.0–77.0)
Platelets: 282 10*3/uL (ref 150.0–400.0)
RBC: 4.88 Mil/uL (ref 4.22–5.81)
RDW: 14.9 % (ref 11.5–15.5)
WBC: 6.2 10*3/uL (ref 4.0–10.5)

## 2014-09-11 MED ORDER — CIPROFLOXACIN HCL 500 MG PO TABS: 500.0000 mg | ORAL_TABLET | Freq: Two times a day (BID) | ORAL | Status: DC

## 2014-09-11 MED ORDER — METRONIDAZOLE 500 MG PO TABS: 500.0000 mg | ORAL_TABLET | Freq: Three times a day (TID) | ORAL | Status: DC

## 2014-09-11 NOTE — Progress Notes (Signed)
Pre visit review using our clinic review tool, if applicable. No additional management support is needed unless otherwise documented below in the visit note.  Off abx for about 2 weeks.  Prev with CAP and diverticulitis.  When he finished the abx, he was feeling "fine."  Was feeling good for a few days, then had return of intermittent abd sx.  Lower abd, same as prev. No chest sx.  No fevers.  No vomiting.  No diarrhea.  Prev with some scant blood in stool, unclear if from superficial irritation, one episode.  "It's exactly the same as before, just not as bad." Eating doesn't affect the pain.    Meds, vitals, and allergies reviewed.   ROS: See HPI.  Otherwise, noncontributory.  nad ncat Mmm Neck supple, no LA rrr ctab abd soft, Lower abd slightly ttp, no rebound, normal BS Ext w/o edema

## 2014-09-11 NOTE — Patient Instructions (Addendum)
Go to the lab on the way out.  We'll contact you with your lab report.  Low fiber diet for now.  Avoid greens, salads, oatmeal, etc.  Try soup and broth for now.  As you feel better, you can gradually go back to your regular diet.  Start both antibiotics in the meantime.  If you have severe pain, then go to the ER.  Take care.  Update me as needed.

## 2014-09-13 NOTE — Assessment & Plan Note (Addendum)
Likely recurrent vs incompletely treated, still nontoxic.  D/w pt.   Restart flagyl, start cipro concurrently.   D/w pt.  Check basic labs today and to ER if worsening.  I expect him to do well.   Diet d/w pt for now, low fiber.

## 2014-10-07 ENCOUNTER — Other Ambulatory Visit: Payer: Self-pay | Admitting: *Deleted

## 2014-10-07 MED ORDER — FLUTICASONE PROPIONATE 50 MCG/ACT NA SUSP
2.0000 | Freq: Every day | NASAL | Status: DC | PRN
Start: 2014-10-07 — End: 2015-08-21

## 2014-11-20 ENCOUNTER — Other Ambulatory Visit: Payer: Self-pay | Admitting: Family Medicine

## 2014-11-24 ENCOUNTER — Other Ambulatory Visit: Payer: Self-pay | Admitting: Family Medicine

## 2014-12-24 ENCOUNTER — Other Ambulatory Visit: Payer: Self-pay | Admitting: Family Medicine

## 2015-01-28 ENCOUNTER — Emergency Department (HOSPITAL_COMMUNITY): Payer: Medicare HMO

## 2015-01-28 ENCOUNTER — Inpatient Hospital Stay (HOSPITAL_COMMUNITY)
Admission: EM | Admit: 2015-01-28 | Discharge: 2015-01-30 | DRG: 065 | Disposition: A | Discharge status: Home / Self Care | Payer: Medicare HMO | Attending: Internal Medicine | Admitting: Internal Medicine

## 2015-01-28 ENCOUNTER — Encounter (HOSPITAL_COMMUNITY): Payer: Self-pay | Admitting: *Deleted

## 2015-01-28 ENCOUNTER — Inpatient Hospital Stay (HOSPITAL_COMMUNITY): Payer: Medicare HMO

## 2015-01-28 DIAGNOSIS — Z7982 Long term (current) use of aspirin: Secondary | ICD-10-CM | POA: Diagnosis not present

## 2015-01-28 DIAGNOSIS — D6800 Von Willebrand disease, unspecified: Secondary | ICD-10-CM | POA: Diagnosis present

## 2015-01-28 DIAGNOSIS — J449 Chronic obstructive pulmonary disease, unspecified: Secondary | ICD-10-CM | POA: Diagnosis present

## 2015-01-28 DIAGNOSIS — I639 Cerebral infarction, unspecified: Principal | ICD-10-CM | POA: Diagnosis present

## 2015-01-28 DIAGNOSIS — Z8673 Personal history of transient ischemic attack (TIA), and cerebral infarction without residual deficits: Secondary | ICD-10-CM | POA: Insufficient documentation

## 2015-01-28 DIAGNOSIS — Z79899 Other long term (current) drug therapy: Secondary | ICD-10-CM | POA: Diagnosis not present

## 2015-01-28 DIAGNOSIS — I129 Hypertensive chronic kidney disease with stage 1 through stage 4 chronic kidney disease, or unspecified chronic kidney disease: Secondary | ICD-10-CM | POA: Diagnosis present

## 2015-01-28 DIAGNOSIS — Z87891 Personal history of nicotine dependence: Secondary | ICD-10-CM

## 2015-01-28 DIAGNOSIS — R270 Ataxia, unspecified: Secondary | ICD-10-CM | POA: Diagnosis present

## 2015-01-28 DIAGNOSIS — N182 Chronic kidney disease, stage 2 (mild): Secondary | ICD-10-CM | POA: Diagnosis present

## 2015-01-28 DIAGNOSIS — I10 Essential (primary) hypertension: Secondary | ICD-10-CM | POA: Diagnosis present

## 2015-01-28 DIAGNOSIS — K2270 Barrett's esophagus without dysplasia: Secondary | ICD-10-CM | POA: Diagnosis present

## 2015-01-28 DIAGNOSIS — K219 Gastro-esophageal reflux disease without esophagitis: Secondary | ICD-10-CM | POA: Diagnosis present

## 2015-01-28 DIAGNOSIS — R269 Unspecified abnormalities of gait and mobility: Secondary | ICD-10-CM | POA: Diagnosis present

## 2015-01-28 DIAGNOSIS — H9190 Unspecified hearing loss, unspecified ear: Secondary | ICD-10-CM | POA: Diagnosis present

## 2015-01-28 DIAGNOSIS — E785 Hyperlipidemia, unspecified: Secondary | ICD-10-CM | POA: Diagnosis present

## 2015-01-28 DIAGNOSIS — I6350 Cerebral infarction due to unspecified occlusion or stenosis of unspecified cerebral artery: Secondary | ICD-10-CM | POA: Diagnosis not present

## 2015-01-28 DIAGNOSIS — E7800 Pure hypercholesterolemia, unspecified: Secondary | ICD-10-CM | POA: Diagnosis present

## 2015-01-28 DIAGNOSIS — H3532 Exudative age-related macular degeneration: Secondary | ICD-10-CM | POA: Diagnosis present

## 2015-01-28 DIAGNOSIS — R42 Dizziness and giddiness: Secondary | ICD-10-CM | POA: Diagnosis present

## 2015-01-28 LAB — COMPREHENSIVE METABOLIC PANEL
ALT: 12 U/L — ABNORMAL LOW (ref 17–63)
AST: 23 U/L (ref 15–41)
Albumin: 3.7 g/dL (ref 3.5–5.0)
Alkaline Phosphatase: 90 U/L (ref 38–126)
Anion gap: 10 (ref 5–15)
BUN: 19 mg/dL (ref 6–20)
CO2: 25 mmol/L (ref 22–32)
Calcium: 9.3 mg/dL (ref 8.9–10.3)
Chloride: 103 mmol/L (ref 101–111)
Creatinine, Ser: 1.36 mg/dL — ABNORMAL HIGH (ref 0.61–1.24)
GFR calc Af Amer: 52 mL/min — ABNORMAL LOW (ref >60)
GFR calc non Af Amer: 45 mL/min — ABNORMAL LOW (ref >60)
Glucose, Bld: 136 mg/dL — ABNORMAL HIGH (ref 65–99)
Potassium: 3.8 mmol/L (ref 3.5–5.1)
Sodium: 138 mmol/L (ref 135–145)
Total Bilirubin: 0.4 mg/dL (ref 0.3–1.2)
Total Protein: 7.2 g/dL (ref 6.5–8.1)

## 2015-01-28 LAB — CBC
HCT: 41.6 % (ref 39.0–52.0)
Hemoglobin: 13.5 g/dL (ref 13.0–17.0)
MCH: 26.5 pg (ref 26.0–34.0)
MCHC: 32.5 g/dL (ref 30.0–36.0)
MCV: 81.6 fL (ref 78.0–100.0)
Platelets: 261 10*3/uL (ref 150–400)
RBC: 5.1 MIL/uL (ref 4.22–5.81)
RDW: 14.6 % (ref 11.5–15.5)
WBC: 7.2 10*3/uL (ref 4.0–10.5)

## 2015-01-28 LAB — URINALYSIS, ROUTINE W REFLEX MICROSCOPIC
Bilirubin Urine: NEGATIVE
Glucose, UA: NEGATIVE mg/dL
Hgb urine dipstick: NEGATIVE
Ketones, ur: NEGATIVE mg/dL
Leukocytes, UA: NEGATIVE
Nitrite: NEGATIVE
Protein, ur: NEGATIVE mg/dL
Specific Gravity, Urine: 1.008 (ref 1.005–1.030)
Urobilinogen, UA: 0.2 mg/dL (ref 0.0–1.0)
pH: 5.5 (ref 5.0–8.0)

## 2015-01-28 LAB — DIFFERENTIAL
Basophils Absolute: 0 10*3/uL (ref 0.0–0.1)
Basophils Relative: 1 % (ref 0–1)
Eosinophils Absolute: 0.2 10*3/uL (ref 0.0–0.7)
Eosinophils Relative: 2 % (ref 0–5)
Lymphocytes Relative: 19 % (ref 12–46)
Lymphs Abs: 1.4 10*3/uL (ref 0.7–4.0)
Monocytes Absolute: 0.6 10*3/uL (ref 0.1–1.0)
Monocytes Relative: 8 % (ref 3–12)
Neutro Abs: 5 10*3/uL (ref 1.7–7.7)
Neutrophils Relative %: 70 % (ref 43–77)

## 2015-01-28 LAB — APTT: aPTT: 29 seconds (ref 24–37)

## 2015-01-28 LAB — I-STAT TROPONIN, ED: Troponin i, poc: 0 ng/mL (ref 0.00–0.08)

## 2015-01-28 LAB — PROTIME-INR
INR: 1.09 (ref 0.00–1.49)
Prothrombin Time: 14.3 seconds (ref 11.6–15.2)

## 2015-01-28 MED ORDER — ASPIRIN 300 MG RE SUPP: 300.0000 mg | Freq: Every day | RECTAL | Status: DC

## 2015-01-28 MED ORDER — SUCRALFATE 1 G PO TABS
1.0000 g | ORAL_TABLET | Freq: Every day | ORAL | Status: DC
  Administered 2015-01-28 – 2015-01-29 (×2): 1 g via ORAL
  Filled 2015-01-28 (×2): qty 1

## 2015-01-28 MED ORDER — PANTOPRAZOLE SODIUM 40 MG PO TBEC
40.0000 mg | DELAYED_RELEASE_TABLET | Freq: Two times a day (BID) | ORAL | Status: DC
  Administered 2015-01-29 – 2015-01-30 (×3): 40 mg via ORAL
  Filled 2015-01-28 (×3): qty 1

## 2015-01-28 MED ORDER — GABAPENTIN 300 MG PO CAPS
300.0000 mg | ORAL_CAPSULE | Freq: Two times a day (BID) | ORAL | Status: DC
  Administered 2015-01-28 – 2015-01-30 (×4): 300 mg via ORAL
  Filled 2015-01-28 (×4): qty 1

## 2015-01-28 MED ORDER — ENOXAPARIN SODIUM 40 MG/0.4ML ~~LOC~~ SOLN
40.0000 mg | SUBCUTANEOUS | Status: DC
  Administered 2015-01-28 – 2015-01-29 (×2): 40 mg via SUBCUTANEOUS
  Filled 2015-01-28 (×2): qty 0.4

## 2015-01-28 MED ORDER — HYDRALAZINE HCL 20 MG/ML IJ SOLN: 5.0000 mg | Freq: Four times a day (QID) | INTRAMUSCULAR | Status: DC | PRN

## 2015-01-28 MED ORDER — ASPIRIN 325 MG PO TABS
325.0000 mg | ORAL_TABLET | Freq: Every day | ORAL | Status: DC
  Administered 2015-01-28 – 2015-01-29 (×2): 325 mg via ORAL
  Filled 2015-01-28 (×2): qty 1

## 2015-01-28 MED ORDER — ACETAMINOPHEN 325 MG PO TABS
650.0000 mg | ORAL_TABLET | ORAL | Status: DC | PRN
  Administered 2015-01-28 – 2015-01-29 (×2): 650 mg via ORAL
  Filled 2015-01-28 (×2): qty 2

## 2015-01-28 MED ORDER — LORATADINE 10 MG PO TABS
10.0000 mg | ORAL_TABLET | Freq: Every day | ORAL | Status: DC
  Administered 2015-01-28 – 2015-01-30 (×3): 10 mg via ORAL
  Filled 2015-01-28 (×3): qty 1

## 2015-01-28 MED ORDER — FAMOTIDINE 20 MG PO TABS
20.0000 mg | ORAL_TABLET | Freq: Every day | ORAL | Status: DC
  Administered 2015-01-29 – 2015-01-30 (×2): 20 mg via ORAL
  Filled 2015-01-28 (×2): qty 1

## 2015-01-28 MED ORDER — FLUTICASONE PROPIONATE 50 MCG/ACT NA SUSP
2.0000 | Freq: Every day | NASAL | Status: DC | PRN
  Filled 2015-01-28: qty 16

## 2015-01-28 MED ORDER — STROKE: EARLY STAGES OF RECOVERY BOOK: Freq: Once | Status: DC

## 2015-01-28 MED ORDER — SENNOSIDES-DOCUSATE SODIUM 8.6-50 MG PO TABS: 1.0000 | ORAL_TABLET | Freq: Every evening | ORAL | Status: DC | PRN

## 2015-01-28 MED ORDER — GEMFIBROZIL 600 MG PO TABS
600.0000 mg | ORAL_TABLET | Freq: Two times a day (BID) | ORAL | Status: DC
  Administered 2015-01-28 – 2015-01-30 (×4): 600 mg via ORAL
  Filled 2015-01-28 (×6): qty 1

## 2015-01-28 NOTE — ED Notes (Signed)
MD at bedside. 

## 2015-01-28 NOTE — ED Notes (Signed)
Neuro MD at bedside

## 2015-01-28 NOTE — ED Notes (Addendum)
Pt presents from home via [facility] c/o dizziness beginning this morning 8am.  Pt reports dizzy spells before but this one is more intense.  Pt denies N/V/D, SOB, CP, LOC.  Pt ambulatory to stretcher, no neuro deficits noted per EMS.  EMS reports PVCS >6/min, mulitfocal.  Pt reports no cardiac hx, on cardizem for BP control.  BP-180/90 P-62 reg, R-16, O2-96% RA.  Pt denies pain, a x 4, NAD.  Family at bedside.

## 2015-01-28 NOTE — ED Notes (Signed)
Attempted report 

## 2015-01-28 NOTE — H&P (Signed)
History and Physical  Martin [name] ZOX:[id number] DOB: 19-Nov-1925 DOA: 01/28/2015   PCP: Crawford GivensGraham Duncan, MD  Referring Physician: ED/ Dr. Effie ShyWentz  Chief Complaint: gait instability  HPI:  81 year old male with a history of hypertension, hyperlipidemia, COPD, diverticulitis, and von Willebrand's disease presented with gait instability when he woke up from sleep on the morning of 01/28/2015. The patient stated that he felt "funny" when he went to sleep on the evening of 01/27/2015, but he could not elaborate further. He stated that "I just did not feel right". He initially woke up around 6am and came back to bed after going to the bathroom. When he woke up again around 8:30 AM, he noted some dizziness and leg instability. He stated , "my legs gave out". He felt his knees without any syncope. He had difficulty getting up. As a result EMS was activated. He denied any visual disturbance, vision loss, headache, without any difficulty, dysarthria, focal extremity weakness. There was no bowel or bladder incontinence. He denied any chest pain or shortness of breath. In the emergency department, MRI of the brain revealed punctate nonhemorrhagic infarct in the right postcentral gyrus with a second adjacent punctate focus in the adjacent white matter. The were remote lacunar infarcts. CBC was unremarkable. BMP shows creatinine 1.6. Hepatic panel was unremarkable.  Assessment/Plan: Acute nonhemorrhagic stroke -Neurology has been consulted -Patient was not TPA candidate as he woke up with the symptoms -MRA brain -Carotid duplex -Echocardiogram -Hemoglobin A1c, lipid panel -Aspirin 325 mg daily -PT/OT/ST CKD stage II -Baseline creatinine 0.8-1.1 -Serum creatinine on presentation was 1.36 -Hold Lasix and potassium supplementation Hypertension -In the setting of acute stroke, we'll allow permissive hypertension in the first 24 hours -Hold diltiazem and metoprolol succinate -Hydralazine when necessary SBP  >200 GERD/PUD -Continue Protonix and Carafate and Zantac Hyperlipidemia -Continue Lopid COPD -Stable -Patient has 40-pack-year history of tobacco, quit 30 years ago       Past Medical History  Diagnosis Date  . Barrett esophagus 09/2003  . GERD (gastroesophageal reflux disease)   . Hemorrhoids   . Diverticulosis   . Hyperlipidemia   . Hypertension   . Von Willebrand disease   . PUD (peptic ulcer disease)   . Hearing loss   . GI AVM (gastrointestinal arteriovenous vascular malformation)   . History of prostatitis   . Hemorrhoids   . Macular degeneration, wet     receiving intra-ocular injections ([facility])  . Back pain     improved with gabapentin as of 2015   Past Surgical History  Procedure Laterality Date  . Inguinal hernia repair  2001  . Laparoscopic appendectomy N/A 07/07/2014    Procedure: APPENDECTOMY LAPAROSCOPIC;  Surgeon: Manus RuddMatthew Tsuei, MD;  Location: [facility] OR;  Service: General;  Laterality: N/A;   Social History:  reports that he has quit smoking. His smoking use included Cigarettes. He has a 36 pack-year smoking history. He has never used smokeless tobacco. He reports that he does not drink alcohol or use illicit drugs.   Family History  Problem Relation Age of Onset  . Heart attack Father   . Coronary artery disease Father   . Hypertension Mother   . Pancreatic cancer Mother     Died at 2186.  . Cancer Mother     Pancreatic cancer  . Arthritis Sister   . Hyperlipidemia Sister   . Prostate cancer Brother   . Cancer Brother     Bone Cancer secondary to Prostate Cancer  . Diabetes  Neg Hx      Allergies  Allergen Reactions  . Atorvastatin     REACTION: muscles tense up   . Codeine Nausea And Vomiting  . Lisinopril Swelling    Lips and face swelling  . Statins Other (See Comments)    Muscle tense up      Prior to Admission medications   Medication Sig Start Date End Date Taking? Authorizing Provider  acetaminophen (TYLENOL) 325 MG tablet  Take 2 tablets (650 mg total) by mouth every 6 (six) hours as needed for mild pain, moderate pain, fever or headache (or Fever >/= 101). 07/15/14  Yes Elease Etienne, MD  aspirin EC 81 MG tablet Take 81 mg by mouth every evening.   Yes Historical Provider, MD  cetirizine (ZYRTEC) 10 MG tablet Take 10 mg by mouth daily as needed for allergies.    Yes Historical Provider, MD  diltiazem (DILACOR XR) 240 MG 24 hr capsule Take 1 capsule (240 mg total) by mouth daily. 08/19/14  Yes Joaquim Nam, MD  fluticasone (FLONASE) 50 MCG/ACT nasal spray Place 2 sprays into both nostrils daily as needed for allergies. 10/07/14  Yes Joaquim Nam, MD  furosemide (LASIX) 40 MG tablet TAKE 1 TABLET BY MOUTH DAILY 11/20/14  Yes Joaquim Nam, MD  gabapentin (NEURONTIN) 300 MG capsule Take 1 capsule (300 mg total) by mouth 2 (two) times daily.   Yes Joaquim Nam, MD  gemfibrozil (LOPID) 600 MG tablet TAKE 1 TABLET BY MOUTH TWICE A DAY 07/31/14  Yes Joaquim Nam, MD  Hypromellose (ARTIFICIAL TEARS OP) Apply 1 drop to eye daily as needed (itchy / watery eyes).   Yes Historical Provider, MD  metoprolol succinate (TOPROL-XL) 25 MG 24 hr tablet TAKE ONE TABLET BY MOUTH EVERY NIGHT AT BEDTIME 08/11/14  Yes Joaquim Nam, MD  Multiple Vitamins-Minerals (OCUVITE ADULT 50+ PO) Take 1 capsule by mouth daily.     Yes Historical Provider, MD  pantoprazole (PROTONIX) 40 MG tablet Take 1 tablet (40 mg total) by mouth 2 (two) times daily before a meal. 07/15/14 07/15/15 Yes Elease Etienne, MD  potassium chloride SA (K-DUR,KLOR-CON) 20 MEQ tablet TAKE 1 TABLET BY MOUTH DAILY 07/03/14  Yes Joaquim Nam, MD  ranitidine (ZANTAC) 150 MG tablet TAKE 1 TABLET BY MOUTH TWICE A DAY 12/24/14  Yes Joaquim Nam, MD  sucralfate (CARAFATE) 1 G tablet TAKE 1 TABLET BY MOUTH BEFORE LAYING DOWN FOR NAP OR BEDTIME 11/24/14  Yes Joaquim Nam, MD  Bevacizumab (AVASTIN IV) Place into the right eye See admin instructions. Every 7 weeks     Historical Provider, MD  loperamide (IMODIUM) 2 MG capsule Take 1 capsule (2 mg total) by mouth 3 (three) times daily as needed for diarrhea or loose stools. 07/15/14   Elease Etienne, MD  tamsulosin (FLOMAX) 0.4 MG CAPS capsule Take 1 capsule (0.4 mg total) by mouth daily. Patient not taking: Reported on 01/28/2015 07/15/14   Elease Etienne, MD    Review of Systems:  Constitutional:  No weight loss, night sweats, Fevers, chills, fatigue.  Head&Eyes: No headache.  No vision loss.  No eye pain or scotoma ENT:  No Difficulty swallowing,Tooth/dental problems,Sore throat,   Cardio-vascular:  No chest pain, Orthopnea, PND, swelling in lower extremities,  dizziness, palpitations  GI:  No  abdominal pain, nausea, vomiting, diarrhea, loss of appetite, hematochezia, melena, heartburn, indigestion, Resp:  No shortness of breath with exertion or at rest. No cough.  No coughing up of blood .No wheezing.No chest wall deformity  Skin:  no rash or lesions.  GU:  no dysuria, change in color of urine, no urgency or frequency. No flank pain.  Musculoskeletal:  No joint pain or swelling. No decreased range of motion. No back pain.  Psych:  No change in mood or affect. No depression or anxiety. Neurologic: No headache, no dysesthesia, no focal weakness, no vision loss. No syncope  Physical Exam: Filed Vitals:   01/28/15 1300 01/28/15 1432 01/28/15 1433 01/28/15 1515  BP: 131/59 149/75  163/69  Pulse: 61 57 62 60  Temp:      TempSrc:      Resp: 12  12 24   SpO2: 95% 96% 95% 100%   General:  A&O x 3, NAD, nontoxic, pleasant/cooperative Head/Eye: No conjunctival hemorrhage, no icterus, [place]/AT, No nystagmus ENT:  No icterus,  No thrush, good dentition, no pharyngeal exudate Neck:  No masses, no lymphadenpathy, no bruits CV:  RRR, no rub, no gallop, no S3 Lung:  CTAB, good air movement, no wheeze, no rhonchi Abdomen: soft/NT, +BS, nondistended, no peritoneal signs Ext: No cyanosis, No rashes,  No petechiae, No lymphangitis, No edema Neuro: CNII-XII intact, strength 4/5 in bilateral upper and lower extremities, no dysmetria  Labs on Admission:  Basic Metabolic Panel:  Recent Labs Lab 01/28/15 1058  NA 138  K 3.8  CL 103  CO2 25  GLUCOSE 136*  BUN 19  CREATININE 1.36*  CALCIUM 9.3   Liver Function Tests:  Recent Labs Lab 01/28/15 1058  AST 23  ALT 12*  ALKPHOS 90  BILITOT 0.4  PROT 7.2  ALBUMIN 3.7   No results for input(s): LIPASE, AMYLASE in the last 168 hours. No results for input(s): AMMONIA in the last 168 hours. CBC:  Recent Labs Lab 01/28/15 1058  WBC 7.2  NEUTROABS 5.0  HGB 13.5  HCT 41.6  MCV 81.6  PLT 261   Cardiac Enzymes: No results for input(s): CKTOTAL, CKMB, CKMBINDEX, TROPONINI in the last 168 hours. BNP: Invalid input(s): POCBNP CBG: No results for input(s): GLUCAP in the last 168 hours.  Radiological Exams on Admission: Mr Brain Wo Contrast  01/28/2015   CLINICAL DATA:  Dizziness beginning at 8 o\'clock a.m. today.  EXAM: MRI HEAD WITHOUT CONTRAST  TECHNIQUE: Multiplanar, multiecho pulse sequences of the brain and surrounding structures were obtained without intravenous contrast.  COMPARISON:  None.  FINDINGS: A 5 mm acute nonhemorrhagic white matter infarct is present deep to the right post central gyrus. There may be a punctate white matter infarct just below and slightly more anterior to this lesion.  Moderate generalized scratch the moderate bilateral periventricular T2 changes are seen bilaterally. These extend into the brainstem. There is a remote scratch the there are at least 2 remote lacunar infarcts within the right coronal radiata.  No acute hemorrhage or mass lesion is present.  Abnormal signal is evident within the left vertebral artery suggesting slow or occluded flow. There are at least 3 punctate nonhemorrhagic infarcts within the left cerebellum. Flow is present in the anterior circulation.  Bilateral lens extractions are  noted. Rightward nasal septal deviation and spurring is noted. The paranasal sinuses are clear. Fluid is noted in the left mastoid air cells. No obstructing nasopharyngeal lesion is present.  IMPRESSION: 1. Punctate nonhemorrhagic white matter infarct deep to the right post central gyrus. A second punctate adjacent white matter infarct may be present as well. 2. Moderate generalized white matter disease bilaterally suggesting  chronic microvascular ischemia. 3. Remote lacunar infarcts within the right coronal radiata and left cerebellum. These results were called by telephone at the time of interpretation on 01/28/2015 at 2:18 pm to Dr. Mancel Bale , who verbally acknowledged these results.   Electronically Signed   By: Marin Roberts M.D.   On: 01/28/2015 14:22    EKG: Independently reviewed. Sinus rhythm, nonspecific ST change    Time spent:60 minutes Code Status:   FULL Family Communication:   Wife updated at bedside   Martin Kennard, DO  [facility] Pager [phone]  If 7PM-7AM, please contact night-coverage [email or web address] Password [email or web address] 01/28/2015, 3:31 PM

## 2015-01-28 NOTE — ED Notes (Signed)
Patient denies pain and is resting comfortably.  

## 2015-01-28 NOTE — ED Notes (Signed)
MRI aware pt is ready for transport.

## 2015-01-28 NOTE — Consult Note (Signed)
Referring Physician: Effie ShyWentz    Chief Complaint: Stroke  HPI:                                                                                                                                         [name] is an 81 y.o. male who was brought to ED due to sensation of dizziness and gait instability. He was feeling normal yesterday and was able to do his yard work. Last night he felt "funny" but cannot explain what that means. He was up and down a few times during the night d to inability to sleep. When he awoke at 0800 and tried to walk to the bathroom he was unable to keep in a straight line. He did not feel vertigo but felt off balance. At one point his legs gave out on him and wife had a hard time getting him up to the chair. Wife did take his BP and noted it was systolic 150's but when EMS arrived he was 202 systolic. Currently he is not feeling dizzy or off balance but does feel light headed transiently when sitting up.  Patient states he does take ASA daily.   Date last known well: Date: 01/27/2015 Time last known well: Time: 08:00 tPA Given: No: out of window  Modified Rankin: Rankin Score=0   Past Medical History  Diagnosis Date  . Barrett esophagus 09/2003  . GERD (gastroesophageal reflux disease)   . Hemorrhoids   . Diverticulosis   . Hyperlipidemia   . Hypertension   . Von Willebrand disease   . PUD (peptic ulcer disease)   . Hearing loss   . GI AVM (gastrointestinal arteriovenous vascular malformation)   . History of prostatitis   . Hemorrhoids   . Macular degeneration, wet     receiving intra-ocular injections ([facility])  . Back pain     improved with gabapentin as of 2015    Past Surgical History  Procedure Laterality Date  . Inguinal hernia repair  2001  . Laparoscopic appendectomy N/A 07/07/2014    Procedure: APPENDECTOMY LAPAROSCOPIC;  Surgeon: Manus RuddMatthew Tsuei, MD;  Location: [facility] OR;  Service: General;  Laterality: N/A;    Family History  Problem Relation Age of  Onset  . Heart attack Father   . Coronary artery disease Father   . Hypertension Mother   . Pancreatic cancer Mother     Died at 3086.  . Cancer Mother     Pancreatic cancer  . Arthritis Sister   . Hyperlipidemia Sister   . Prostate cancer Brother   . Cancer Brother     Bone Cancer secondary to Prostate Cancer  . Diabetes Neg Hx    Social History:  reports that he has quit smoking. His smoking use included Cigarettes. He has a 36 pack-year smoking history. He has never used smokeless tobacco. He reports that he does not drink alcohol or use  illicit drugs.  Allergies:  Allergies  Allergen Reactions  . Atorvastatin     REACTION: muscles tense up   . Codeine Nausea And Vomiting  . Lisinopril Swelling    Lips and face swelling  . Statins Other (See Comments)    Muscle tense up    Medications:                                                                                                                          No current facility-administered medications for this encounter.   Current Outpatient Prescriptions  Medication Sig Dispense Refill  . acetaminophen (TYLENOL) 325 MG tablet Take 2 tablets (650 mg total) by mouth every 6 (six) hours as needed for mild pain, moderate pain, fever or headache (or Fever >/= 101).    [name] Kitchen aspirin EC 81 MG tablet Take 81 mg by mouth every evening.    . cetirizine (ZYRTEC) 10 MG tablet Take 10 mg by mouth daily as needed for allergies.     [name] Kitchen diltiazem (DILACOR XR) 240 MG 24 hr capsule Take 1 capsule (240 mg total) by mouth daily. 90 capsule 3  . fluticasone (FLONASE) 50 MCG/ACT nasal spray Place 2 sprays into both nostrils daily as needed for allergies. 16 g 5  . furosemide (LASIX) 40 MG tablet TAKE 1 TABLET BY MOUTH DAILY 30 tablet 11  . gabapentin (NEURONTIN) 300 MG capsule Take 1 capsule (300 mg total) by mouth 2 (two) times daily. 180 capsule 3  . gemfibrozil (LOPID) 600 MG tablet TAKE 1 TABLET BY MOUTH TWICE A DAY 60 tablet 5  . Hypromellose  (ARTIFICIAL TEARS OP) Apply 1 drop to eye daily as needed (itchy / watery eyes).    . metoprolol succinate (TOPROL-XL) 25 MG 24 hr tablet TAKE ONE TABLET BY MOUTH EVERY NIGHT AT BEDTIME 30 tablet 5  . Multiple Vitamins-Minerals (OCUVITE ADULT 50+ PO) Take 1 capsule by mouth daily.      . pantoprazole (PROTONIX) 40 MG tablet Take 1 tablet (40 mg total) by mouth 2 (two) times daily before a meal. 60 tablet 0  . potassium chloride SA (K-DUR,KLOR-CON) 20 MEQ tablet TAKE 1 TABLET BY MOUTH DAILY 30 tablet 5  . ranitidine (ZANTAC) 150 MG tablet TAKE 1 TABLET BY MOUTH TWICE A DAY 30 tablet 6  . sucralfate (CARAFATE) 1 G tablet TAKE 1 TABLET BY MOUTH BEFORE LAYING DOWN FOR NAP OR BEDTIME 30 tablet 5  . Bevacizumab (AVASTIN IV) Place into the right eye See admin instructions. Every 7 weeks    . loperamide (IMODIUM) 2 MG capsule Take 1 capsule (2 mg total) by mouth 3 (three) times daily as needed for diarrhea or loose stools. 10 capsule 0  . tamsulosin (FLOMAX) 0.4 MG CAPS capsule Take 1 capsule (0.4 mg total) by mouth daily. (Patient not taking: Reported on 01/28/2015) 30 capsule 0  . [DISCONTINUED] lisinopril (PRINIVIL,ZESTRIL) 10 MG tablet Take 1 tablet (10 mg total) by mouth daily. 30 tablet 0  ROS:                                                                                                                                       History obtained from the patient  General ROS: negative for - chills, fatigue, fever, night sweats, weight gain or weight loss Psychological ROS: negative for - behavioral disorder, hallucinations, memory difficulties, mood swings or suicidal ideation Ophthalmic ROS: negative for - blurry vision, double vision, eye pain or loss of vision ENT ROS: negative for - epistaxis, nasal discharge, oral lesions, sore throat, tinnitus or vertigo Allergy and Immunology ROS: negative for - hives or itchy/watery eyes Hematological and Lymphatic ROS: negative for - bleeding problems,  bruising or swollen lymph nodes Endocrine ROS: negative for - galactorrhea, hair pattern changes, polydipsia/polyuria or temperature intolerance Respiratory ROS: negative for - cough, hemoptysis, shortness of breath or wheezing Cardiovascular ROS: negative for - chest pain, dyspnea on exertion, edema or irregular heartbeat Gastrointestinal ROS: negative for - abdominal pain, diarrhea, hematemesis, nausea/vomiting or stool incontinence Genito-Urinary ROS: negative for - dysuria, hematuria, incontinence or urinary frequency/urgency Musculoskeletal ROS: negative for - joint swelling or muscular weakness Neurological ROS: as noted in HPI Dermatological ROS: negative for rash and skin lesion changes  Neurologic Examination:                                                                                                      Blood pressure 149/75, pulse 62, temperature 97.8 F (36.6 C), temperature source Oral, resp. rate 12, SpO2 95 %.  HEENT-  Normocephalic, no lesions, without obvious abnormality.  Normal external eye and conjunctiva.  Normal TM's bilaterally.  Normal auditory canals and external ears. Normal external nose, mucus membranes and septum.  Normal pharynx. Cardiovascular- S1, S2 normal, pulses palpable throughout   Lungs- chest clear, no wheezing, rales, normal symmetric air entry Abdomen- normal findings: bowel sounds normal Extremities- no edema Lymph-no adenopathy palpable Musculoskeletal-no joint tenderness, deformity or swelling Skin-warm and dry, no hyperpigmentation, vitiligo, or suspicious lesions  Neurological Examination Mental Status: Alert, oriented, thought content appropriate.  Speech fluent without evidence of aphasia.  Able to follow 3 step commands without difficulty. Cranial Nerves: II: Discs flat bilaterally; Visual Fields grossly normal, pupils equal, round, reactive to light and accommodation III,IV, VI: ptosis not present, extra-ocular motions intact  bilaterally V,VII: smile symmetric, facial light touch sensation normal bilaterally VIII: hearing decreased bilaterally IX,X: uvula rises symmetrically XI: bilateral shoulder shrug XII: midline tongue extension Motor:  Right : Upper extremity   5/5    Left:     Upper extremity   5/5  Lower extremity   5/5     Lower extremity   5/5 Tone and bulk:normal tone throughout; no atrophy noted Sensory: Pinprick and light touch intact throughout, bilaterally Deep Tendon Reflexes: 2+ and symmetric throughout Plantars: Right: downgoing   Left: downgoing Cerebellar: normal finger-to-nose,  normal heel-to-shin test Gait: not tested due to patient safety   Lab Results: Basic Metabolic Panel:  Recent Labs Lab 01/28/15 1058  NA 138  K 3.8  CL 103  CO2 25  GLUCOSE 136*  BUN 19  CREATININE 1.36*  CALCIUM 9.3    Liver Function Tests:  Recent Labs Lab 01/28/15 1058  AST 23  ALT 12*  ALKPHOS 90  BILITOT 0.4  PROT 7.2  ALBUMIN 3.7   No results for input(s): LIPASE, AMYLASE in the last 168 hours. No results for input(s): AMMONIA in the last 168 hours.  CBC:  Recent Labs Lab 01/28/15 1058  WBC 7.2  NEUTROABS 5.0  HGB 13.5  HCT 41.6  MCV 81.6  PLT 261    Cardiac Enzymes: No results for input(s): CKTOTAL, CKMB, CKMBINDEX, TROPONINI in the last 168 hours.  Lipid Panel: No results for input(s): CHOL, TRIG, HDL, CHOLHDL, VLDL, LDLCALC in the last 168 hours.  CBG: No results for input(s): GLUCAP in the last 168 hours.  Microbiology: Results for orders placed or performed during the hospital encounter of 07/06/14  Surgical pcr screen     Status: None   Collection Time: 07/07/14 12:52 AM  Result Value Ref Range Status   MRSA, PCR NEGATIVE NEGATIVE Final   Staphylococcus aureus NEGATIVE NEGATIVE Final    Comment:        The Xpert SA Assay (FDA approved for NASAL specimens in patients over 29 years of age), is one component of a comprehensive surveillance program.   Test performance has been validated by [employer] for patients greater than or equal to 74 year old. It is not intended to diagnose infection nor to guide or monitor treatment.   Clostridium Difficile by PCR     Status: None   Collection Time: 07/13/14 12:51 PM  Result Value Ref Range Status   C difficile by pcr NEGATIVE NEGATIVE Final    Coagulation Studies:  Recent Labs  01/28/15 1058  LABPROT 14.3  INR 1.09    Imaging: Mr Brain Wo Contrast  01/28/2015   CLINICAL DATA:  Dizziness beginning at 8 o\'clock a.m. today.  EXAM: MRI HEAD WITHOUT CONTRAST  TECHNIQUE: Multiplanar, multiecho pulse sequences of the brain and surrounding structures were obtained without intravenous contrast.  COMPARISON:  None.  FINDINGS: A 5 mm acute nonhemorrhagic white matter infarct is present deep to the right post central gyrus. There may be a punctate white matter infarct just below and slightly more anterior to this lesion.  Moderate generalized scratch the moderate bilateral periventricular T2 changes are seen bilaterally. These extend into the brainstem. There is a remote scratch the there are at least 2 remote lacunar infarcts within the right coronal radiata.  No acute hemorrhage or mass lesion is present.  Abnormal signal is evident within the left vertebral artery suggesting slow or occluded flow. There are at least 3 punctate nonhemorrhagic infarcts within the left cerebellum. Flow is present in the anterior circulation.  Bilateral lens extractions are noted. Rightward nasal septal deviation and spurring is noted. The paranasal sinuses are clear. Fluid is noted  in the left mastoid air cells. No obstructing nasopharyngeal lesion is present.  IMPRESSION: 1. Punctate nonhemorrhagic white matter infarct deep to the right post central gyrus. A second punctate adjacent white matter infarct may be present as well. 2. Moderate generalized white matter disease bilaterally suggesting chronic microvascular  ischemia. 3. Remote lacunar infarcts within the right coronal radiata and left cerebellum. These results were called by telephone at the time of interpretation on 01/28/2015 at 2:18 pm to Dr. Mancel Bale , who verbally acknowledged these results.   Electronically Signed   By: Marin Roberts M.D.   On: 01/28/2015 14:22    Felicie Morn PA-C [facility] [phone]  01/28/2015, 2:54 PM   Patient seen and examined.  Clinical course and management discussed.  Necessary edits performed.  I agree with the above.  Assessment and plan of care developed and discussed below.     Assessment: 81 y.o. male presenting with complaints of dizziness.  MRI of the brain personally reviewed and shows a small infarct in the right post central gyrus and adjacent white matter.  Concern is for an embolic etiology.  Patient on ASA at home.  Further work up recommended.   Stroke Risk Factors - hyperlipidemia and hypertension   Recommendations: 1. HgbA1c, fasting lipid panel 2. PT consult, OT consult, Speech consult 3. Echocardiogram 4. Carotid dopplers 5. Prophylactic therapy-Antiplatelet med: Aspirin - dose  daily 6. NPO until RN stroke swallow screen 7. Telemetry monitoring 8. Frequent neuro checks   Thana Farr, MD [facility] [phone]  01/28/2015  4:48 PM

## 2015-01-28 NOTE — ED Provider Notes (Signed)
CSN: [id number]     Arrival date & time 01/28/15  1003 History   First MD Initiated Contact with Patient 01/28/15 1004     Chief Complaint  Patient presents with  . Dizziness     (Consider location/radiation/quality/duration/timing/severity/associated sxs/prior Treatment) HPI   [name] is a 81 y.o. male who presents for evaluation of dizziness which was present upon arising this morning. He was able to make it to the bathroom, but when he walked to the kitchen, he was "walking wall to wall". He feels better at rest. He feels like his legs could not support him. No recent illnesses. Yesterday he worked in the garden for about 30 minutes with the tiller. He is active every day. No similar problem previously. He denies fever, chills, cough, shortness of breath, chest pain, weakness or paresthesia. There are no other known modifying factors.  Past Medical History  Diagnosis Date  . Barrett esophagus 09/2003  . GERD (gastroesophageal reflux disease)   . Hemorrhoids   . Diverticulosis   . Hyperlipidemia   . Hypertension   . Von Willebrand disease   . PUD (peptic ulcer disease)   . Hearing loss   . GI AVM (gastrointestinal arteriovenous vascular malformation)   . History of prostatitis   . Hemorrhoids   . Macular degeneration, wet     receiving intra-ocular injections ([place])  . Back pain     improved with gabapentin as of 2015   Past Surgical History  Procedure Laterality Date  . Inguinal hernia repair  2001  . Laparoscopic appendectomy N/A 07/07/2014    Procedure: APPENDECTOMY LAPAROSCOPIC;  Surgeon: Manus Rudd, MD;  Location: [facility] OR;  Service: General;  Laterality: N/A;   Family History  Problem Relation Age of Onset  . Heart attack Father   . Coronary artery disease Father   . Hypertension Mother   . Pancreatic cancer Mother     Died at 44.  . Cancer Mother     Pancreatic cancer  . Arthritis Sister   . Hyperlipidemia Sister   . Prostate cancer Brother   . Cancer  Brother     Bone Cancer secondary to Prostate Cancer  . Diabetes Neg Hx    History  Substance Use Topics  . Smoking status: Former Smoker -- 1.00 packs/day for 36 years    Types: Cigarettes  . Smokeless tobacco: Never Used     Comment: Quit in 1983  . Alcohol Use: No    Review of Systems  All other systems reviewed and are negative.     Allergies  Atorvastatin; Codeine; Lisinopril; and Statins  Home Medications   Prior to Admission medications   Medication Sig Start Date End Date Taking? Authorizing Provider  acetaminophen (TYLENOL) 325 MG tablet Take 2 tablets (650 mg total) by mouth every 6 (six) hours as needed for mild pain, moderate pain, fever or headache (or Fever >/= 101). 07/15/14  Yes Elease Etienne, MD  aspirin EC 81 MG tablet Take 81 mg by mouth every evening.   Yes Historical Provider, MD  cetirizine (ZYRTEC) 10 MG tablet Take 10 mg by mouth daily as needed for allergies.    Yes Historical Provider, MD  diltiazem (DILACOR XR) 240 MG 24 hr capsule Take 1 capsule (240 mg total) by mouth daily. 08/19/14  Yes Joaquim Nam, MD  fluticasone (FLONASE) 50 MCG/ACT nasal spray Place 2 sprays into both nostrils daily as needed for allergies. 10/07/14  Yes Joaquim Nam, MD  furosemide (  LASIX) 40 MG tablet TAKE 1 TABLET BY MOUTH DAILY 11/20/14  Yes Joaquim NamGraham S Duncan, MD  gabapentin (NEURONTIN) 300 MG capsule Take 1 capsule (300 mg total) by mouth 2 (two) times daily.   Yes Joaquim NamGraham S Duncan, MD  gemfibrozil (LOPID) 600 MG tablet TAKE 1 TABLET BY MOUTH TWICE A DAY 07/31/14  Yes Joaquim NamGraham S Duncan, MD  Hypromellose (ARTIFICIAL TEARS OP) Apply 1 drop to eye daily as needed (itchy / watery eyes).   Yes Historical Provider, MD  metoprolol succinate (TOPROL-XL) 25 MG 24 hr tablet TAKE ONE TABLET BY MOUTH EVERY NIGHT AT BEDTIME 08/11/14  Yes Joaquim NamGraham S Duncan, MD  Multiple Vitamins-Minerals (OCUVITE ADULT 50+ PO) Take 1 capsule by mouth daily.     Yes Historical Provider, MD  pantoprazole  (PROTONIX) 40 MG tablet Take 1 tablet (40 mg total) by mouth 2 (two) times daily before a meal. 07/15/14 07/15/15 Yes Elease EtienneAnand D Hongalgi, MD  potassium chloride SA (K-DUR,KLOR-CON) 20 MEQ tablet TAKE 1 TABLET BY MOUTH DAILY 07/03/14  Yes Joaquim NamGraham S Duncan, MD  ranitidine (ZANTAC) 150 MG tablet TAKE 1 TABLET BY MOUTH TWICE A DAY 12/24/14  Yes Joaquim NamGraham S Duncan, MD  sucralfate (CARAFATE) 1 G tablet TAKE 1 TABLET BY MOUTH BEFORE LAYING DOWN FOR NAP OR BEDTIME 11/24/14  Yes Joaquim NamGraham S Duncan, MD  Bevacizumab (AVASTIN IV) Place into the right eye See admin instructions. Every 7 weeks    Historical Provider, MD  loperamide (IMODIUM) 2 MG capsule Take 1 capsule (2 mg total) by mouth 3 (three) times daily as needed for diarrhea or loose stools. 07/15/14   Elease EtienneAnand D Hongalgi, MD  tamsulosin (FLOMAX) 0.4 MG CAPS capsule Take 1 capsule (0.4 mg total) by mouth daily. Patient not taking: Reported on 01/28/2015 07/15/14   Elease EtienneAnand D Hongalgi, MD   BP 149/75 mmHg  Pulse 62  Temp(Src) 97.8 F (36.6 C) (Oral)  Resp 12  SpO2 95% Physical Exam  Constitutional: He is oriented to person, place, and time. He appears well-developed and well-nourished.  HENT:  Head: Normocephalic and atraumatic.  Right Ear: External ear normal.  Left Ear: External ear normal.  Eyes: Conjunctivae and EOM are normal. Pupils are equal, round, and reactive to light.  Neck: Normal range of motion and phonation normal. Neck supple.  Cardiovascular: Normal rate, regular rhythm and normal heart sounds.   Pulmonary/Chest: Effort normal and breath sounds normal. He exhibits no bony tenderness.  Abdominal: Soft. There is no tenderness.  Musculoskeletal: Normal range of motion.  Neurological: He is alert and oriented to person, place, and time. No cranial nerve deficit or sensory deficit. He exhibits normal muscle tone. Coordination normal.  No dysarthria or aphasia. Mild left beating nystagmus, with left lateral gaze. No ataxia. Normal heel-to-shin,  bilaterally. No pronator drift.  Skin: Skin is warm, dry and intact.  Psychiatric: He has a normal mood and affect. His behavior is normal. Judgment and thought content normal.  Nursing note and vitals reviewed.   ED Course  Procedures (including critical care time)  10:20- thrombolysis consideration. Patient had symptoms upon awakening, therefore is not considered a code stroke candidate; TPA will not be gived   Medications - No data to display  Patient Vitals for the past 24 hrs:  BP Temp Temp src Pulse Resp SpO2  01/28/15 1433 - - - 62 12 95 %  01/28/15 1432 149/75 mmHg - - (!) 57 - 96 %  01/28/15 1300 131/59 mmHg - - 61 12 95 %  01/28/15 1245 158/72 mmHg - - (!) 58 21 95 %  01/28/15 1215 163/74 mmHg - - 60 13 96 %  01/28/15 1208 - 97.8 F (36.6 C) - - - -  01/28/15 1145 155/72 mmHg - - (!) 59 15 96 %  01/28/15 1130 (!) 114/46 mmHg - - 64 11 97 %  01/28/15 1115 150/62 mmHg - - (!) 56 10 96 %  01/28/15 1100 161/75 mmHg - - 68 16 96 %  01/28/15 1030 173/91 mmHg - - 70 15 98 %  01/28/15 1010 188/89 mmHg 97.9 F (36.6 C) Oral 66 17 98 %  01/28/15 1008 188/89 mmHg - - 69 18 98 %    14: 27- consult neuro hospitalist, she will see the patient in the ED  2:33 PM-Consult complete with Dr, Tat. Patient case explained and discussed. He agrees to admit patient for further evaluation and treatment. Call ended at 14:55  3:05 PM Reevaluation with update and discussion. After initial assessment and treatment, an updated evaluation reveals no change in clinical status. Findings discussed with patient, all questions answered. Renai Lopata L    Labs Review Labs Reviewed  COMPREHENSIVE METABOLIC PANEL - Abnormal; Notable for the following:    Glucose, Bld 136 (*)    Creatinine, Ser 1.36 (*)    ALT 12 (*)    GFR calc non Af Amer 45 (*)    GFR calc Af Amer 52 (*)    All other components within normal limits  PROTIME-INR  APTT  CBC  DIFFERENTIAL  URINALYSIS, ROUTINE W REFLEX  MICROSCOPIC (NOT AT [facility])  Rosezena Sensor, ED    Imaging Review Mr Brain Wo Contrast  01/28/2015   CLINICAL DATA:  Dizziness beginning at 8 o\'clock a.m. today.  EXAM: MRI HEAD WITHOUT CONTRAST  TECHNIQUE: Multiplanar, multiecho pulse sequences of the brain and surrounding structures were obtained without intravenous contrast.  COMPARISON:  None.  FINDINGS: A 5 mm acute nonhemorrhagic white matter infarct is present deep to the right post central gyrus. There may be a punctate white matter infarct just below and slightly more anterior to this lesion.  Moderate generalized scratch the moderate bilateral periventricular T2 changes are seen bilaterally. These extend into the brainstem. There is a remote scratch the there are at least 2 remote lacunar infarcts within the right coronal radiata.  No acute hemorrhage or mass lesion is present.  Abnormal signal is evident within the left vertebral artery suggesting slow or occluded flow. There are at least 3 punctate nonhemorrhagic infarcts within the left cerebellum. Flow is present in the anterior circulation.  Bilateral lens extractions are noted. Rightward nasal septal deviation and spurring is noted. The paranasal sinuses are clear. Fluid is noted in the left mastoid air cells. No obstructing nasopharyngeal lesion is present.  IMPRESSION: 1. Punctate nonhemorrhagic white matter infarct deep to the right post central gyrus. A second punctate adjacent white matter infarct may be present as well. 2. Moderate generalized white matter disease bilaterally suggesting chronic microvascular ischemia. 3. Remote lacunar infarcts within the right coronal radiata and left cerebellum. These results were called by telephone at the time of interpretation on 01/28/2015 at 2:18 pm to Dr. Mancel Bale , who verbally acknowledged these results.   Electronically Signed   By: Marin Roberts M.D.   On: 01/28/2015 14:22     EKG Interpretation   Date/Time:  Wednesday January 28 2015 10:07:52 EDT Ventricular Rate:  74 PR Interval:  180 QRS Duration: 102 QT Interval:  394 QTC Calculation: 437 R Axis:   41 Text Interpretation:  Sinus rhythm Multiple ventricular premature  complexes Since last tracing Premature ventricular complexes are new  Confirmed by Effie Shy  MD, Mechele Collin ([id number]) on 01/28/2015 11:17:36 AM      MDM   Final diagnoses:  Cerebral infarction due to unspecified mechanism  Ataxia    Acute CVA with ataxia. He will require admission for further evaluation, treatment.    Nursing Notes Reviewed/ Care Coordinated, and agree without changes. Applicable Imaging Reviewed.  Interpretation of Laboratory Data incorporated into ED treatment  Plan: Admit    Mancel Bale, MD 01/28/15 [phone]

## 2015-01-28 NOTE — Progress Notes (Signed)
Pt arrived to 4N06 via stretcher.  A/O x 4.  No c/o pain, in no apparent distress.  Telemetry applied.  Call bell within reach.  Will continue to monitor. Sondra ComeSilva, Harla Mensch M, RN

## 2015-01-28 NOTE — ED Notes (Signed)
Patient transported to MRI 

## 2015-01-29 ENCOUNTER — Inpatient Hospital Stay (HOSPITAL_COMMUNITY): Payer: Medicare HMO

## 2015-01-29 ENCOUNTER — Other Ambulatory Visit (HOSPITAL_COMMUNITY): Payer: Medicare HMO

## 2015-01-29 DIAGNOSIS — I639 Cerebral infarction, unspecified: Principal | ICD-10-CM

## 2015-01-29 DIAGNOSIS — E780 Pure hypercholesterolemia: Secondary | ICD-10-CM

## 2015-01-29 DIAGNOSIS — I10 Essential (primary) hypertension: Secondary | ICD-10-CM

## 2015-01-29 DIAGNOSIS — I6350 Cerebral infarction due to unspecified occlusion or stenosis of unspecified cerebral artery: Secondary | ICD-10-CM

## 2015-01-29 LAB — BASIC METABOLIC PANEL
Anion gap: 8 (ref 5–15)
BUN: 21 mg/dL — ABNORMAL HIGH (ref 6–20)
CO2: 28 mmol/L (ref 22–32)
Calcium: 9 mg/dL (ref 8.9–10.3)
Chloride: 102 mmol/L (ref 101–111)
Creatinine, Ser: 1.32 mg/dL — ABNORMAL HIGH (ref 0.61–1.24)
GFR calc Af Amer: 54 mL/min — ABNORMAL LOW (ref >60)
GFR calc non Af Amer: 47 mL/min — ABNORMAL LOW (ref >60)
Glucose, Bld: 102 mg/dL — ABNORMAL HIGH (ref 65–99)
Potassium: 3.9 mmol/L (ref 3.5–5.1)
Sodium: 138 mmol/L (ref 135–145)

## 2015-01-29 LAB — LIPID PANEL
Cholesterol: 240 mg/dL — ABNORMAL HIGH (ref 0–200)
HDL: 36 mg/dL — ABNORMAL LOW (ref >40)
LDL Cholesterol: 176 mg/dL — ABNORMAL HIGH (ref 0–99)
Total CHOL/HDL Ratio: 6.7 RATIO
Triglycerides: 141 mg/dL (ref <150)
VLDL: 28 mg/dL (ref 0–40)

## 2015-01-29 LAB — CBC
HCT: 41.3 % (ref 39.0–52.0)
Hemoglobin: 13.3 g/dL (ref 13.0–17.0)
MCH: 26.4 pg (ref 26.0–34.0)
MCHC: 32.2 g/dL (ref 30.0–36.0)
MCV: 81.9 fL (ref 78.0–100.0)
Platelets: 263 10*3/uL (ref 150–400)
RBC: 5.04 MIL/uL (ref 4.22–5.81)
RDW: 14.9 % (ref 11.5–15.5)
WBC: 7.2 10*3/uL (ref 4.0–10.5)

## 2015-01-29 MED ORDER — ASPIRIN EC 81 MG PO TBEC
81.0000 mg | DELAYED_RELEASE_TABLET | Freq: Every day | ORAL | Status: DC
Start: 2015-01-30 — End: 2015-01-30
  Administered 2015-01-30: 81 mg via ORAL
  Filled 2015-01-29: qty 1

## 2015-01-29 MED ORDER — IOHEXOL 350 MG/ML SOLN
50.0000 mL | Freq: Once | INTRAVENOUS | Status: AC | PRN
  Administered 2015-01-29: 50 mL via INTRAVENOUS

## 2015-01-29 MED ORDER — CLOPIDOGREL BISULFATE 75 MG PO TABS
75.0000 mg | ORAL_TABLET | Freq: Every day | ORAL | Status: DC
  Administered 2015-01-29 – 2015-01-30 (×2): 75 mg via ORAL
  Filled 2015-01-29 (×2): qty 1

## 2015-01-29 NOTE — Progress Notes (Signed)
PROGRESS NOTE  [name]:[id number] DOB: 04-04-1926 DOA: 01/28/2015 PCP: [name], MD  Assessment/Plan: Acute nonhemorrhagic stroke -Neurology consulted -Patient was not TPA candidate as he woke up with the symptoms -MRA brain +: Punctate nonhemorrhagic white matter infarct deep to the right post central gyrus. A second punctate adjacent white matter infarct may be present as well. -CTA neck: Moderate narrowing origin of the left vertebral artery. Narrowing left vertebral artery at the C1-2 level. Occluded left vertebral artery at the level the foramen magnum (V4). -Echocardiogram pending -Hemoglobin A1c pending  lipid panel LDL 176- allergic to statins ASA/plavix -PT/OT/ST- outpatient PT- patient delcined  CKD stage II -Baseline creatinine 0.8-1.1 -Serum creatinine on presentation was 1.36 -Hold Lasix and potassium supplementation  Hypertension -In the setting of acute stroke, we'll allow permissive hypertension in the first 24 hours -Hold diltiazem and metoprolol succinate -Hydralazine when necessary SBP >200  GERD/PUD -Continue Protonix and Carafate and Zantac  Hyperlipidemia -Continue Lopid  COPD -Stable -Patient has 40-pack-year history of tobacco, quit 30 years ago  Code Status: full Family Communication: wife at bedside Disposition Plan: home after echo   Consultants:  neuro  Procedures:  echo   HPI/Subjective: Wants to go home  Objective: Filed Vitals:   01/29/15 0941  BP: 94/46  Pulse: 73  Temp: 98.2 F (36.8 C)  Resp: 18   No intake or output data in the 24 hours ending 01/29/15 1028 There were no vitals filed for this visit.  Exam:   General:  A+Ox3, NAD  Cardiovascular: rrr  Respiratory: clear  Abdomen: +BS, soft  Musculoskeletal: no edema   Data Reviewed: Basic Metabolic Panel:  Recent Labs Lab 01/28/15 1058  NA 138  K 3.8  CL 103  CO2 25  GLUCOSE 136*  BUN 19  CREATININE 1.36*  CALCIUM 9.3   Liver  Function Tests:  Recent Labs Lab 01/28/15 1058  AST 23  ALT 12*  ALKPHOS 90  BILITOT 0.4  PROT 7.2  ALBUMIN 3.7   No results for input(s): LIPASE, AMYLASE in the last 168 hours. No results for input(s): AMMONIA in the last 168 hours. CBC:  Recent Labs Lab 01/28/15 1058  WBC 7.2  NEUTROABS 5.0  HGB 13.5  HCT 41.6  MCV 81.6  PLT 261   Cardiac Enzymes: No results for input(s): CKTOTAL, CKMB, CKMBINDEX, TROPONINI in the last 168 hours. BNP (last 3 results) No results for input(s): BNP in the last 8760 hours.  ProBNP (last 3 results) No results for input(s): PROBNP in the last 8760 hours.  CBG: No results for input(s): GLUCAP in the last 168 hours.  No results found for this or any previous visit (from the past 240 hour(s)).   Studies: Dg Chest 2 View  01/29/2015   CLINICAL DATA:  Hypertension.  Possible stroke.  EXAM: CHEST  2 VIEW  COMPARISON:  08/15/2014  FINDINGS: The heart size and mediastinal contours are within normal limits. Both lungs are clear. The visualized skeletal structures are unremarkable.  IMPRESSION: No active cardiopulmonary disease.   Electronically Signed   By: Ellery Plunk M.D.   On: 01/29/2015 02:09   Ct Angio Neck W/cm &/or Wo/cm  01/29/2015   CLINICAL DATA:  81 year old hypertensive male with hyperlipidemia. Acute infarct right posterior frontal -parietal lobe. Subsequent encounter.  EXAM: CT ANGIOGRAPHY NECK  TECHNIQUE: Multidetector CT imaging of the neck was performed using the standard protocol during bolus administration of intravenous contrast. Multiplanar CT image reconstructions and MIPs were obtained to evaluate  the vascular anatomy. Carotid stenosis measurements (when applicable) are obtained utilizing NASCET criteria, using the distal internal carotid diameter as the denominator.  CONTRAST:  50mL OMNIPAQUE IOHEXOL 350 MG/ML SOLN  COMPARISON:  01/29/2015 MR angiogram circle of Willis. 01/28/2015 MR brain. No comparison neck MR angiogram or  CT angiogram.  FINDINGS: Aortic arch: 3 vessel aortic arch with atherosclerotic type changes. Atherosclerotic type changes origin of the great vessels.  Right carotid system: Atherosclerotic type changes with calcified plaque carotid bifurcation with less than 50% diameter stenosis.  Left carotid system: Mild to slightly moderate narrowing proximal left common carotid artery. Plaque left carotid bifurcation with less than 50% diameter stenosis.  Vertebral arteries:Moderate narrowing origin of the left vertebral artery. Narrowing left vertebral artery at the C1-2 level. Occluded left vertebral artery at the level the foramen magnum (V4).  Right vertebral artery is dominant with mild irregularity without high-grade stenosis.  Skeleton: Cervical spondylotic changes most notable C4-5 through C6-7 level.  Other neck: Complete opacification inferior left mastoid air cells. No obstructing lesion of the eustachian tube is noted.  Fluid filled upper cervical esophagus.  Right hilar adenopathy. Visualized lung apices without worrisome mass identified.  IMPRESSION: Moderate narrowing origin of the left vertebral artery. Narrowing left vertebral artery at the C1-2 level. Occluded left vertebral artery at the level the foramen magnum (V4).  Right vertebral artery is dominant with mild irregularity without high-grade stenosis.  Plaque carotid bifurcation bilaterally with less than 50% diameter stenosis.  Mild to slightly moderate narrowing proximal left common carotid artery.  Cervical spondylotic changes most notable C4-5 thru C6-7 level.  Complete opacification inferior left mastoid air cells. No obstructing lesion of the eustachian tube is noted.  Fluid filled upper cervical esophagus.  Right hilar adenopathy.   Electronically Signed   By: Lacy Duverney M.D.   On: 01/29/2015 09:10   Mr Brain Wo Contrast  01/28/2015   CLINICAL DATA:  Dizziness beginning at 8 o\'clock a.m. today.  EXAM: MRI HEAD WITHOUT CONTRAST  TECHNIQUE:  Multiplanar, multiecho pulse sequences of the brain and surrounding structures were obtained without intravenous contrast.  COMPARISON:  None.  FINDINGS: A 5 mm acute nonhemorrhagic white matter infarct is present deep to the right post central gyrus. There may be a punctate white matter infarct just below and slightly more anterior to this lesion.  Moderate generalized scratch the moderate bilateral periventricular T2 changes are seen bilaterally. These extend into the brainstem. There is a remote scratch the there are at least 2 remote lacunar infarcts within the right coronal radiata.  No acute hemorrhage or mass lesion is present.  Abnormal signal is evident within the left vertebral artery suggesting slow or occluded flow. There are at least 3 punctate nonhemorrhagic infarcts within the left cerebellum. Flow is present in the anterior circulation.  Bilateral lens extractions are noted. Rightward nasal septal deviation and spurring is noted. The paranasal sinuses are clear. Fluid is noted in the left mastoid air cells. No obstructing nasopharyngeal lesion is present.  IMPRESSION: 1. Punctate nonhemorrhagic white matter infarct deep to the right post central gyrus. A second punctate adjacent white matter infarct may be present as well. 2. Moderate generalized white matter disease bilaterally suggesting chronic microvascular ischemia. 3. Remote lacunar infarcts within the right coronal radiata and left cerebellum. These results were called by telephone at the time of interpretation on 01/28/2015 at 2:18 pm to Dr. Mancel Bale , who verbally acknowledged these results.   Electronically Signed   By: Cristal Deer  Mattern M.D.   On: 01/28/2015 14:22   Mr Maxine Glenn Head/brain Wo Cm  01/29/2015   CLINICAL DATA:  Gait instability upon awaking January 28, 2015. Follow-up stroke.  EXAM: MRA HEAD WITHOUT CONTRAST  TECHNIQUE: Angiographic images of the Circle of Willis were obtained using MRA technique without intravenous contrast.   COMPARISON:  MRI eye brain January 28, 2015  FINDINGS: Anterior circulation: Normal flow related enhancement of the included cervical, petrous, cavernous and supra clinoid internal carotid arteries. Mild luminal irregularity of the carotid siphons likely represents atherosclerosis. Patent anterior communicating artery. Flow related enhancement of the anterior and middle cerebral arteries, including more distal segments. Moderate stenosis LEFT M2 origin.  No large vessel occlusion, high-grade stenosis, abnormal luminal irregularity, aneurysm.  Posterior circulation: RIGHT vertebral artery is dominant. The LEFT vertebral artery predominately terminates in the posterior inferior cerebellar artery. However, retrograde flow into distal LEFT V4 segment. Basilar artery is patent, with normal flow related enhancement of the main branch vessels. Flow related enhancement of the posterior cerebral arteries. Moderate to high-grade stenosis proximal RIGHT P2 segment. Moderate stenosis LEFT proximal P3 segment.  No large vessel occlusion, abnormal luminal irregularity, aneurysm.  IMPRESSION: LEFT posterior-inferior cerebellar artery predominately terminates in PICA, with occluded LEFT V4 segment.  Moderate to severe stenosis of RIGHT P2, moderate stenosis proximal LEFT P3.  Moderate stenosis LEFT M2 segment origin.   Electronically Signed   By: Awilda Metro M.D.   On: 01/29/2015 02:33    Scheduled Meds: .  stroke: mapping our early stages of recovery book   Does not apply Once  . aspirin  300 mg Rectal Daily   Or  . aspirin  325 mg Oral Daily  . enoxaparin (LOVENOX) injection  40 mg Subcutaneous Q24H  . famotidine  20 mg Oral Daily  . gabapentin  300 mg Oral BID  . gemfibrozil  600 mg Oral BID  . loratadine  10 mg Oral Daily  . pantoprazole  40 mg Oral BID AC  . sucralfate  1 g Oral QHS   Continuous Infusions:  Antibiotics Given (last 72 hours)    None      Active Problems:   Hypercholesteremia   Von  Willebrand's disease   Essential hypertension   Acute ischemic stroke    Time spent: 25 min    Idolina Mantell  [facility] Pager [phone]. If 7PM-7AM, please contact night-coverage at [email or web address], password [facility] 01/29/2015, 10:28 AM  LOS: 1 day

## 2015-01-29 NOTE — Progress Notes (Signed)
STROKE TEAM PROGRESS NOTE   HISTORY [name] is an 81 y.o. male who was brought to ED due to sensation of dizziness and gait instability. He was feeling normal yesterday and was able to do his yard work. Last night he felt "funny" but cannot explain what that means. He was up and down a few times during the night due to inability to sleep. When he awoke at 0800 and tried to walk to the bathroom he was unable to keep in a straight line (LKW 01/27/2015 0800). He did not feel vertigo but felt off balance. At one point his legs gave out on him and wife had a hard time getting him up to the chair. Wife did take his BP and noted it was systolic 150's but when EMS arrived he was 202 systolic. Currently he is not feeling dizzy or off balance but does feel light headed transiently when sitting up. MRI was obtained and did show a nonhemorrhagic infarct in the white matter in the right post central gyrus. Patietn states he does take ASA daily. Neurology was consulted. Modified Rankin: Rankin Score=0. Patient was not administered TPA secondary to out of window. He was admitted for further evaluation and treatment.   SUBJECTIVE (INTERVAL HISTORY) His wife is at the bedside.  Overall he feels his condition is resolved.    OBJECTIVE Temp:  [97.8 F (36.6 C)-98.2 F (36.8 C)] 98.2 F (36.8 C) (06/09 0941) Pulse Rate:  [54-73] 73 (06/09 0941) Cardiac Rhythm:  [-] Sinus bradycardia (06/08 1954) Resp:  [10-24] 18 (06/09 0941) BP: (94-168)/(46-84) 94/46 mmHg (06/09 0941) SpO2:  [91 %-98 %] 91 % (06/09 0941) Weight:  [72.303 kg (159 lb 6.4 oz)] 72.303 kg (159 lb 6.4 oz) (06/09 1038)  No results for input(s): GLUCAP in the last 168 hours.  Recent Labs Lab 01/28/15 1058  NA 138  K 3.8  CL 103  CO2 25  GLUCOSE 136*  BUN 19  CREATININE 1.36*  CALCIUM 9.3    Recent Labs Lab 01/28/15 1058  AST 23  ALT 12*  ALKPHOS 90  BILITOT 0.4  PROT 7.2  ALBUMIN 3.7    Recent Labs Lab 01/28/15 1058  WBC  7.2  NEUTROABS 5.0  HGB 13.5  HCT 41.6  MCV 81.6  PLT 261   No results for input(s): CKTOTAL, CKMB, CKMBINDEX, TROPONINI in the last 168 hours.  Recent Labs  01/28/15 1058  LABPROT 14.3  INR 1.09    Recent Labs  01/28/15 1033  COLORURINE YELLOW  LABSPEC 1.008  PHURINE 5.5  GLUCOSEU NEGATIVE  HGBUR NEGATIVE  BILIRUBINUR NEGATIVE  KETONESUR NEGATIVE  PROTEINUR NEGATIVE  UROBILINOGEN 0.2  NITRITE NEGATIVE  LEUKOCYTESUR NEGATIVE       Component Value Date/Time   CHOL 240* 01/29/2015 0441   TRIG 141 01/29/2015 0441   HDL 36* 01/29/2015 0441   CHOLHDL 6.7 01/29/2015 0441   VLDL 28 01/29/2015 0441   LDLCALC 176* 01/29/2015 0441   No results found for: HGBA1C No results found for: LABOPIA, COCAINSCRNUR, LABBENZ, AMPHETMU, THCU, LABBARB  No results for input(s): ETH in the last 168 hours.  Dg Chest 2 View 01/29/2015    No active cardiopulmonary disease.     Mr Brain Wo Contrast 01/28/2015    1. Punctate nonhemorrhagic white matter infarct deep to the right post central gyrus. A second punctate adjacent white matter infarct may be present as well. 2. Moderate generalized white matter disease bilaterally suggesting chronic microvascular ischemia. 3. Remote lacunar infarcts  within the right coronal radiata and left cerebellum.   Mr Maxine Glenn Head/brain Wo Cm 01/29/2015   LEFT posterior-inferior cerebellar artery predominately terminates in PICA, with occluded LEFT V4 segment.  Moderate to severe stenosis of RIGHT P2, moderate stenosis proximal LEFT P3.  Moderate stenosis LEFT M2 segment origin.     Ct Angio Neck W/cm &/or Wo/cm 01/29/2015   Moderate narrowing origin of the left vertebral artery. Narrowing left vertebral artery at the C1-2 level. Occluded left vertebral artery at the level the foramen magnum (V4).  Right vertebral artery is dominant with mild irregularity without high-grade stenosis.  Plaque carotid bifurcation bilaterally with less than 50% diameter stenosis.  Mild to  slightly moderate narrowing proximal left common carotid artery.  Cervical spondylotic changes most notable C4-5 thru C6-7 level.  Complete opacification inferior left mastoid air cells. No obstructing lesion of the eustachian tube is noted.  Fluid filled upper cervical esophagus.  Right hilar adenopathy.   2D echo - pending    PHYSICAL EXAM  Temp:  [97.6 F (36.4 C)-98.2 F (36.8 C)] 97.7 F (36.5 C) (06/10 0532) Pulse Rate:  [69-84] 82 (06/10 0532) Resp:  [16-18] 16 (06/10 0532) BP: (94-157)/(46-71) 120/58 mmHg (06/10 0532) SpO2:  [91 %-96 %] 96 % (06/10 0532) Weight:  [159 lb 6.4 oz (72.303 kg)] 159 lb 6.4 oz (72.303 kg) (06/09 1038)  General - Well nourished, well developed, in no apparent distress.  Ophthalmologic - Fundi not visualized due to eye movement.  Cardiovascular - Regular rate and rhythm.  Mental Status -  Level of arousal and orientation to time, place, and person were intact. Language including expression, naming, repetition, comprehension was assessed and found intact.  Cranial Nerves II - XII - II - Visual field intact OU. III, IV, VI - Extraocular movements intact. V - Facial sensation intact bilaterally. VII - Facial movement intact bilaterally. VIII - Hard of hearing & vestibular intact bilaterally. X - Palate elevates symmetrically. XI - Chin turning & shoulder shrug intact bilaterally. XII - Tongue protrusion intact.  Motor Strength - The patient's strength was normal in all extremities and pronator drift was absent.  Bulk was normal and fasciculations were absent.   Motor Tone - Muscle tone was assessed at the neck and appendages and was normal.  Reflexes - The patient's reflexes were 1+ in all extremities and he had no pathological reflexes.  Sensory - Light touch, temperature/pinprick were assessed and were symmetrical.    Coordination - The patient had normal movements in the hands and feet with no ataxia or dysmetria.  Tremor was  absent.  Gait and Station - not tested due to safety concerns and PT is going to work with him.    ASSESSMENT/PLAN [name] is a 81 y.o. male with history of hypertension, hyperlipidemia, COPD, diverticulitis, and von Willebrand's disease presenting with dizziness and gait instability. He did not receive IV t-PA due to delay in arrival.   Stroke:  left frontal MCA/ACA punctate infarcts, likely due to small/large vessel disease in the setting of diffuse intracranial athero  MRI  Punctate right post central gyrus and adjacent white matter infarcts. chronic microvascular ischemia. Old lacunar infarcts within the right coronal radiata and left cerebellum.   MRA  L VA stenosis w/ occluded L V4 segment, R P2, L P3 and L M2 stenoses  CTA neck  Occluded L VA at V4, intracranial atherosclerosis  2D Echo  pending   LDL 176, not at goal  HgbA1c pending  Lovenox 40 mg sq daily for VTE prophylaxis Diet Heart Room service appropriate?: Yes; Fluid consistency:: Thin  aspirin 81 mg orally every day prior to admission, now on aspirin 81 mg orally every day. Given large vessel intracranial atherosclerosis, patient should be treated with aspirin 81 mg and clopidogrel 75 mg orally every day x 3 months for secondary stroke prevention. After 3 months, change to plavix alone. Long-term dual antiplatelets are contraindicated due to risk for intracerebral hemorrhage. Order updated.  Patient counseled to be compliant with his antithrombotic medications  Ongoing aggressive stroke risk factor management  Therapy recommendations:  OP PT, no OT  Disposition:  Home with OP therapy  Hypertension  Home meds:   Metoprolol  BP goal 120-140 given posterior circulation stenoses  Wife to start checking BPs at home  Avoid hypotension  Hyperlipidemia  Home meds:  lopid  LDL 176, goal < 70  Intolerant to statins  Recommend to follow up with PCP for PCSK9 inhibitors   Other Stroke Risk  Factors  Advanced age  Former Cigarette smoker, quit smoking years ago   Other Active Problems  CKD stage III  GERD/PUD  COPD  Hospital day # 1  BIBY,SHARON  [facility] See Amion for Pager information 01/29/2015 2:33 PM   I, the attending vascular neurologist, have personally obtained a history, examined the patient, evaluated laboratory data, individually viewed imaging studies and agree with radiology interpretations. Together with the NP/PA, we formulated the assessment and plan of care which reflects our mutual decision.  I have made any additions or clarifications directly to the above note and agree with the findings and plan as currently documented.   81 yo M with hx of GI bleeding, HTN, HLD admitted for right frontal MCA/ACA punctate infarcts. Stroke work up showed left VA occlusion and intracranial stenosis. LDL was high at 176 but intolerate to statins. Etiology likely due to atherosclerosis. Recommend dural antiplatelet for 3 months. Watch for GIB. Avoid hypotension. Recommend follow up with PCP to consider PCSK9 inhibitors.   Marvel Plan, MD PhD Stroke Neurology 01/30/2015 6:08 AM   To contact Stroke Continuity provider, please refer to [email or web address]. After hours, contact General Neurology

## 2015-01-29 NOTE — Evaluation (Signed)
Physical Therapy Evaluation Patient Details Name: [name] MRN: [id number] DOB: 1926-03-17 Today's Date: 01/29/2015   History of Present Illness  81 year old male with a history of hypertension, hyperlipidemia, COPD, diverticulitis, and von Willebrand's disease presented with gait instability when he woke up from sleep on the morning of 01/28/2015. Admitted for acute nonhemorrhagic stroke.  Clinical Impression  Pt admitted with the above diagnosis. Pt currently with functional limitations due to the deficits listed below (see PT Problem List). Demonstrates mild ataxia with gait. Stability improves greatly with use of a single point cane which he plans to purchase independently at d/c. Very supportive wife who can provide assistance as needed. Recommended he follow up with outpatient physical therapy for follow up with gait/balance training to progress patient's functional independence and safety. Adequate for d/c from a PT standpoint when medically ready. Will continue to follow and progress, monitoring for any changes in functional ability.    Follow Up Recommendations Outpatient PT    Equipment Recommendations  None recommended by PT (( reccomended cane use -pt wants to purchase cane on own))    Recommendations for Other Services       Precautions / Restrictions Precautions Precautions: Fall Restrictions Weight Bearing Restrictions: No      Mobility  Bed Mobility Overal bed mobility: Modified Independent             General bed mobility comments: Requires extra time  Transfers Overall transfer level: Needs assistance Equipment used: None Transfers: Sit to/from Stand Sit to Stand: Supervision         General transfer comment: supervision for safety. Slow to rise, using back of knees on bed for support. No loss of balance requiring assist.  Ambulation/Gait Ambulation/Gait assistance: Min guard Ambulation Distance (Feet): 315 Feet Assistive device: None;Straight  cane;Rolling walker (2 wheeled) Gait Pattern/deviations: Step-through pattern;Decreased stride length;Scissoring;Ataxic;Drifts right/left;Narrow base of support   Gait velocity interpretation: at or above normal speed for age/gender General Gait Details: Mildly ataxic, occasionally scissoring of gait. Close guard for safety. Drifts left/right intermittently however able to self correct. tolerated dynamic gait challenges including variable speeds, quick turns, heel walking, toe walking, and backwards stepping; each task exacerbating his balance difficulties. Trialed 100 feet with a single point cane which greatly improves patients stability. Also trialed RW x50 feet which patient used adequately and improved his balance.  Stairs            Wheelchair Mobility    Modified Rankin (Stroke Patients Only) Modified Rankin (Stroke Patients Only) Pre-Morbid Rankin Score: No symptoms Modified Rankin: Moderately severe disability     Balance Overall balance assessment: Needs assistance Sitting-balance support: No upper extremity supported;Feet supported Sitting balance-Leahy Scale: Good     Standing balance support: No upper extremity supported Standing balance-Leahy Scale: Fair                               Pertinent Vitals/Pain Pain Assessment: No/denies pain    Home Living Family/patient expects to be discharged to:: Private residence Living Arrangements: Spouse/significant other Available Help at Discharge: Family;Available 24 hours/day Type of Home: House Home Access: Stairs to enter Entrance Stairs-Rails: None Entrance Stairs-Number of Steps: 1 Home Layout: One level Home Equipment: Walker - 2 wheels;Wheelchair - manual;Shower seat - built in      Prior Function Level of Independence: Independent               Hand Dominance   Dominant  Hand: Right    Extremity/Trunk Assessment   Upper Extremity Assessment: Defer to OT evaluation (mild LUE  dysmetria with FNF test)           Lower Extremity Assessment: Overall WFL for tasks assessed         Communication   Communication: HOH  Cognition Arousal/Alertness: Awake/alert Behavior During Therapy: WFL for tasks assessed/performed Overall Cognitive Status: Impaired/Different from baseline Area of Impairment: Orientation Orientation Level: Disoriented to;Time (unsure of month)             General Comments: Wife states husband usually notes month but not always    General Comments General comments (skin integrity, edema, etc.): Discussed follow up PT with wife and patient. Use of cane for stability. Safe mobility techniques.    Exercises        Assessment/Plan    PT Assessment Patient needs continued PT services  PT Diagnosis Abnormality of gait;Difficulty walking   PT Problem List Decreased balance;Decreased mobility;Decreased coordination;Decreased knowledge of use of DME  PT Treatment Interventions DME instruction;Gait training;Stair training;Functional mobility training;Therapeutic activities;Therapeutic exercise;Balance training   PT Goals (Current goals can be found in the Care Plan section) Acute Rehab PT Goals Patient Stated Goal: go home, figure out whats going on with me PT Goal Formulation: With patient Time For Goal Achievement: 02/12/15 Potential to Achieve Goals: Good    Frequency Min 4X/week   Barriers to discharge        Co-evaluation               End of Session Equipment Utilized During Treatment: Gait belt Activity Tolerance: Patient tolerated treatment well Patient left: in chair;with call bell/phone within reach;with chair alarm set;with family/visitor present Nurse Communication: Mobility status         Time: 4098-1191 PT Time Calculation (min) (ACUTE ONLY): 32 min   Charges:   PT Evaluation $Initial PT Evaluation Tier I: 1 Procedure PT Treatments $Gait Training: 8-22 mins   PT G CodesBerton Mount 01/29/2015, 10:55 AM Charlsie Merles, PT [phone]

## 2015-01-29 NOTE — Progress Notes (Signed)
Occupational Therapy Evaluation Patient Details Name: [name] MRN: [id number] DOB: May 08, 1926 Today's Date: 01/29/2015    History of Present Illness 81 year old male with a history of hypertension, hyperlipidemia, COPD, diverticulitis, and von Willebrand's disease presented with gait instability when he woke up from sleep on the morning of 01/28/2015. Admitted for acute nonhemorrhagic stroke.   Clinical Impression   Pt admitted with the above diagnoses and presents with below problem list. Pt will benefit from continued acute OT to address the below listed deficits and maximize independence with BADLs prior to d/c to venue below. PTA pt was independent with ADLs. Pt presents with decreased standing balance and impulsivity impacting level of assist to supervision level. OT to continue to follow acutely.     Follow Up Recommendations  Supervision/Assistance - 24 hour;No OT follow up    Equipment Recommendations  None recommended by OT    Recommendations for Other Services       Precautions / Restrictions Precautions Precautions: Fall Restrictions Weight Bearing Restrictions: No      Mobility Bed Mobility Overal bed mobility: Modified Independent             General bed mobility comments: Requires extra time  Transfers Overall transfer level: Needs assistance Equipment used: None Transfers: Sit to/from Stand Sit to Stand: Supervision         General transfer comment: for safety    Balance Overall balance assessment: Needs assistance Sitting-balance support: Feet supported Sitting balance-Leahy Scale: Good     Standing balance support: No upper extremity supported;During functional activity Standing balance-Leahy Scale: Fair Standing balance comment: mild pertubations in static standing                            ADL Overall ADL's : Needs assistance/impaired Eating/Feeding: Set up;Sitting   Grooming: Standing;Supervision/safety Grooming  Details (indicate cue type and reason): supervision for balance Upper Body Bathing: Independent;Sitting   Lower Body Bathing: Supervison/ safety;Sit to/from stand   Upper Body Dressing : Set up;Sitting   Lower Body Dressing: Supervision/safety;Sit to/from stand   Toilet Transfer: Supervision/safety;Ambulation;Comfort height toilet Toilet Transfer Details (indicate cue type and reason): did not use grab bars, good balance upon initial sit<>stand. Pt thern noted to attempt again, jumping up this time; impulsive, no LOB though.   Toileting- Architect and Hygiene: Supervision/safety   Tub/ Shower Transfer: Walk-in shower;Supervision/safety;Ambulation;Shower seat   Functional mobility during ADLs: Supervision/safety General ADL Comments: Pt presents with decreased balance and impulsivity impacting level of assist to superivision level. Pt completed bed mobility, functional mobility, toilet transfer and grooming standing at sink at supervision level. Spouse present.     Vision Additional Comments: Pt reports no change in vision. Able to read items up close and at a distance. Able to scan to find item up close on newspaper.    Perception     Praxis      Pertinent Vitals/Pain Pain Assessment: No/denies pain     Hand Dominance Right   Extremity/Trunk Assessment Upper Extremity Assessment Upper Extremity Assessment: Overall WFL for tasks assessed;LUE deficits/detail;RUE deficits/detail RUE Deficits / Details: increased time with FNT LUE Deficits / Details: mild dysmetria and increased time with FNT   Lower Extremity Assessment Lower Extremity Assessment: Overall WFL for tasks assessed       Communication Communication Communication: HOH   Cognition Arousal/Alertness: Awake/alert Behavior During Therapy: WFL for tasks assessed/performed;Impulsive Overall Cognitive Status: Impaired/Different from baseline Area of Impairment: Orientation;Safety/judgement Orientation  Level: Disoriented to;Time (after long pause stated month as "May")       Safety/Judgement: Decreased awareness of safety     General Comments: Pt jumped up from toilet on second sit<>stand. When asked about cognition pt's spouse states he is "pretty much back to normal" from her perspective. Pt stated, "I don't need a cane." Educated on benefit of cane for balance per PT note.   General Comments       Exercises       Shoulder Instructions      Home Living Family/patient expects to be discharged to:: Private residence Living Arrangements: Spouse/significant other Available Help at Discharge: Family;Available 24 hours/day Type of Home: House Home Access: Stairs to enter [employer] of Steps: 1 Entrance Stairs-Rails: None Home Layout: One level     Bathroom Shower/Tub: Producer, television/film/video: Standard     Home Equipment: Environmental consultant - 2 wheels;Wheelchair - Manufacturing systems engineer - built in          Prior Functioning/Environment Level of Independence: Independent        Comments: Pt very active PTA- Geophysical data processor.    OT Diagnosis: Cognitive deficits;Other (comment) (decreased balance)   OT Problem List: Impaired balance (sitting and/or standing);Decreased knowledge of use of DME or AE;Decreased knowledge of precautions;Decreased cognition;Decreased safety awareness   OT Treatment/Interventions: Self-care/ADL training;DME and/or AE instruction;Therapeutic activities;Cognitive remediation/compensation;Patient/family education;Balance training    OT Goals(Current goals can be found in the care plan section) Acute Rehab OT Goals Patient Stated Goal: go home, figure out whats going on with me OT Goal Formulation: With patient/family Time For Goal Achievement: 02/12/15 Potential to Achieve Goals: Good ADL Goals Pt Will Perform Grooming: with modified independence;standing Pt Will Transfer to Toilet: with modified  independence;ambulating;regular height toilet Pt Will Perform Tub/Shower Transfer: Shower transfer;shower seat (cane)  OT Frequency: Min 2X/week   Barriers to D/C:            Co-evaluation              End of Session Nurse Communication: Other (comment) (impulsity noted)  Activity Tolerance:   Patient left: in bed;with call bell/phone within reach;with bed alarm set   Time: 5686-1683 OT Time Calculation (min): 21 min Charges:  OT General Charges $OT Visit: 1 Procedure OT Evaluation $Initial OT Evaluation Tier I: 1 Procedure G-Codes:    Pilar Grammes Feb 19, 2015, 2:06 PM

## 2015-01-30 ENCOUNTER — Inpatient Hospital Stay (HOSPITAL_COMMUNITY): Payer: Medicare HMO

## 2015-01-30 DIAGNOSIS — I639 Cerebral infarction, unspecified: Secondary | ICD-10-CM

## 2015-01-30 DIAGNOSIS — Z8673 Personal history of transient ischemic attack (TIA), and cerebral infarction without residual deficits: Secondary | ICD-10-CM | POA: Insufficient documentation

## 2015-01-30 LAB — CBC
HCT: 40.7 % (ref 39.0–52.0)
Hemoglobin: 13.2 g/dL (ref 13.0–17.0)
MCH: 26.5 pg (ref 26.0–34.0)
MCHC: 32.4 g/dL (ref 30.0–36.0)
MCV: 81.6 fL (ref 78.0–100.0)
Platelets: 270 10*3/uL (ref 150–400)
RBC: 4.99 MIL/uL (ref 4.22–5.81)
RDW: 14.8 % (ref 11.5–15.5)
WBC: 10.1 10*3/uL (ref 4.0–10.5)

## 2015-01-30 LAB — HEMOGLOBIN A1C
Hgb A1c MFr Bld: 6.1 % — ABNORMAL HIGH (ref 4.8–5.6)
Mean Plasma Glucose: 128 mg/dL

## 2015-01-30 LAB — BASIC METABOLIC PANEL
Anion gap: 10 (ref 5–15)
BUN: 19 mg/dL (ref 6–20)
CO2: 27 mmol/L (ref 22–32)
Calcium: 9.1 mg/dL (ref 8.9–10.3)
Chloride: 103 mmol/L (ref 101–111)
Creatinine, Ser: 1.38 mg/dL — ABNORMAL HIGH (ref 0.61–1.24)
GFR calc Af Amer: 51 mL/min — ABNORMAL LOW (ref >60)
GFR calc non Af Amer: 44 mL/min — ABNORMAL LOW (ref >60)
Glucose, Bld: 92 mg/dL (ref 65–99)
Potassium: 3.6 mmol/L (ref 3.5–5.1)
Sodium: 140 mmol/L (ref 135–145)

## 2015-01-30 MED ORDER — CLOPIDOGREL BISULFATE 75 MG PO TABS: 75.0000 mg | ORAL_TABLET | Freq: Every day | ORAL | Status: DC

## 2015-01-30 MED ORDER — DILTIAZEM HCL ER 180 MG PO CP24: 180.0000 mg | ORAL_CAPSULE | Freq: Every day | ORAL | Status: DC

## 2015-01-30 NOTE — Progress Notes (Signed)
Pt discharging with his wife at this time alert, verbal taking all personal belongings. IV's discontinued, applied dry dressing. Discharge instructions along with prescriptions provided with verbal understanding. Pt aware of follow up information. Pt stated that he will not be needing Outpt PT. No noted distress.

## 2015-01-30 NOTE — Care Management Note (Signed)
Case Management Note  Patient Details  Name: BRAINARD REGES MRN: [id number] Date of Birth: [birth year]  Subjective/Objective:                    Action/Plan: Therapy recommendations for outpatient follow-up noted. Per conversation with Dr Benjamine Mola, patient is declining and no orders will be placed at discharge.  This information was verified by bedside RN. Expected Discharge Date:                  Expected Discharge Plan:  Home/Self Care  In-House Referral:     Discharge planning Services     Post Acute Care Choice:    Choice offered to:     DME Arranged:    DME Agency:     HH Arranged:    HH Agency:     Status of Service:  Completed, signed off  Medicare Important Message Given:    Date Medicare IM Given:    Medicare IM give by:    Date Additional Medicare IM Given:    Additional Medicare Important Message give by:     If discussed at Long Length of Stay Meetings, dates discussed:    Additional Comments:  Anda Kraft, RN 01/30/2015, 12:27 PM

## 2015-01-30 NOTE — Evaluation (Addendum)
Speech Language Pathology Evaluation Patient Details Name: [name] MRN: [id number] DOB: 1926-04-20 Today's Date: 01/30/2015 Time: 4098-1191 SLP Time Calculation (min) (ACUTE ONLY): 25 min  Problem List:  Patient Active Problem List   Diagnosis Date Noted  . Stroke   . Acute ischemic stroke 01/28/2015  . Advance care planning 08/21/2014  . Memory change 08/21/2014  . Diverticulitis, colon 08/16/2014  . Acute diverticulitis 08/16/2014  . AP (abdominal pain) 08/16/2014  . CAP (community acquired pneumonia) 08/16/2014  . Ectatic abdominal aorta 08/16/2014  . Diarrhea   . Acute respiratory failure with hypoxemia 07/12/2014  . Atelectasis 07/12/2014  . COPD exacerbation   . Urinary retention   . Abdominal pain   . Scrotal pain 05/29/2014  . GERD (gastroesophageal reflux disease) 05/29/2014  . Left flank pain, chronic 01/22/2013  . Hypokalemia 12/28/2012  . Angioedema of lips 12/28/2012  . Medicare annual wellness visit, initial 08/15/2011  . ANGIODYSPLASIA, CECUM 11/30/2009  . HEARING LOSS, BILATERAL 07/27/2009  . Von Willebrand's disease 08/23/2008  . DYSPHAGIA 08/21/2008  . Barrett's esophagus 05/16/2008  . TEMPOROMANDIBULAR JOINT DISORDER 10/16/2007  . UMBILICAL HERNIA 09/22/2007  . Hypercholesteremia 05/15/2007  . Essential hypertension 05/15/2007  . HEMORRHOIDS 05/15/2007  . FX, ANGLE OF JAW, CLOSED 05/15/2007   Past Medical History:  Past Medical History  Diagnosis Date  . Barrett esophagus 09/2003  . GERD (gastroesophageal reflux disease)   . Hemorrhoids   . Diverticulosis   . Hyperlipidemia   . Hypertension   . Von Willebrand disease   . PUD (peptic ulcer disease)   . Hearing loss   . GI AVM (gastrointestinal arteriovenous vascular malformation)   . History of prostatitis   . Hemorrhoids   . Macular degeneration, wet     receiving intra-ocular injections ([facility])  . Back pain     improved with gabapentin as of 2015   Past Surgical History:  Past  Surgical History  Procedure Laterality Date  . Inguinal hernia repair  2001  . Laparoscopic appendectomy N/A 07/07/2014    Procedure: APPENDECTOMY LAPAROSCOPIC;  Surgeon: Manus Rudd, MD;  Location: [facility] OR;  Service: General;  Laterality: N/A;   HPI:  Pt is an 81 yo male admitted with hypertension, hyperlipidemia, COPD, diverticulitis, and von Willebrand's disease presented with gait instability when he woke up from sleep on the morning of 01/28/2015. The patient stated that he felt "funny" when he went to sleep on the evening of 01/27/2015, but he could not elaborate further. He stated that "I just did not feel right". He initially woke up around 6am on 01/28/15 and came back to bed after going to the bathroom. When he woke up again around 8:30 AM, he noted some dizziness and leg instability. Pt denies any dysphagia or dysarthria. MR head on 01/29/15 indicated moderate stenosis of Right P2 and proximal left P3, left posterior-inferior cerebellar artery predominantly terminates in PICA with occlusion left V4 segment; moderate stenosis in Left M2 segment origin.  Assessment / Plan / Recommendation Clinical Impression   Pt appears to be functioning without any difficulty with auditory comprehension, verbal expression or cognition at this time; intelligible speech noted during complex conversational tasks. Pt/family stated no concerns at this time with any areas assessed; ST not recommended at D/C as pt is functional with speech/language and cognition tasks.    SLP Assessment  Patient does not need any further Speech Language Pathology Services    Follow Up Recommendations  None    Frequency and Duration  n/a     Pertinent Vitals/Pain Pain Assessment: No/denies pain   SLP Goals  Patient/Family Stated Goal: return home  SLP Evaluation Prior Functioning  Cognitive/Linguistic Baseline: Within functional limits Type of Home: House  Lives With: Spouse Available Help at Discharge:  Family;Available 24 hours/day Vocation: Retired   IT consultant  Overall Cognitive Status: Within Functional Limits for tasks assessed Arousal/Alertness: Awake/alert Orientation Level: Oriented X4 Memory: Appears intact Awareness: Appears intact Problem Solving: Appears intact Safety/Judgment: Appears intact    Comprehension  Auditory Comprehension Overall Auditory Comprehension: Appears within functional limits for tasks assessed Yes/No Questions: Within Functional Limits Commands: Within Functional Limits Conversation: Complex Visual Recognition/Discrimination Discrimination: Not tested Reading Comprehension Reading Status: Not tested    Expression Expression Primary Mode of Expression: Verbal Verbal Expression Overall Verbal Expression: Appears within functional limits for tasks assessed Initiation: No impairment Level of Generative/Spontaneous Verbalization: Conversation Repetition: No impairment Naming: No impairment Pragmatics: No impairment Non-Verbal Means of Communication: Not applicable Written Expression Dominant Hand: Right Written Expression: Not tested   Oral / Motor Oral Motor/Sensory Function Overall Oral Motor/Sensory Function: Appears within functional limits for tasks assessed Labial ROM: Within Functional Limits Labial Symmetry: Within Functional Limits Labial Strength: Within Functional Limits Labial Sensation: Within Functional Limits Lingual ROM: Within Functional Limits Lingual Symmetry: Within Functional Limits Lingual Strength: Within Functional Limits Lingual Sensation: Within Functional Limits Facial ROM: Within Functional Limits Facial Symmetry: Within Functional Limits Facial Strength: Within Functional Limits Facial Sensation: Within Functional Limits Velum: Within Functional Limits Mandible: Within Functional Limits Motor Speech Overall Motor Speech: Appears within functional limits for tasks assessed Respiration: Within functional  limits Phonation: Normal Resonance: Within functional limits Articulation: Within functional limitis Intelligibility: Intelligible Motor Planning: Witnin functional limits Motor Speech Errors: Not applicable        Blair Lundeen,PAT, M.S., CCC-SLP 01/30/2015, 11:17 AM

## 2015-01-30 NOTE — Discharge Summary (Signed)
Physician Discharge Summary  [name] EAV:[id number] DOB: February 13, 1926 DOA: 01/28/2015  PCP: [name], MD  Admit date: 01/28/2015 Discharge date: 01/31/2015  Time spent: 35 minutes  Recommendations for Outpatient Follow-up:  Recommend to follow up with PCP for PCSK9 inhibitors aspirin 81 mg and clopidogrel 75 mg orally every day x 3 months for secondary stroke prevention. After 3 months, change to plavix alone.  Discharge Diagnoses:  Active Problems:   Hypercholesteremia   Von Willebrand's disease   Essential hypertension   Acute ischemic stroke   Stroke   Discharge Condition: improved  Diet recommendation: cardiac/diabetic  Filed Weights   01/29/15 1038  Weight: 72.303 kg (159 lb 6.4 oz)    History of present illness:  81 year old male with a history of hypertension, hyperlipidemia, COPD, diverticulitis, and von Willebrand's disease presented with gait instability when he woke up from sleep on the morning of 01/28/2015. The patient stated that he felt "funny" when he went to sleep on the evening of 01/27/2015, but he could not elaborate further. He stated that "I just did not feel right". He initially woke up around 6am and came back to bed after going to the bathroom. When he woke up again around 8:30 AM, he noted some dizziness and leg instability. He stated , "my legs gave out". He felt his knees without any syncope. He had difficulty getting up. As a result EMS was activated. He denied any visual disturbance, vision loss, headache, without any difficulty, dysarthria, focal extremity weakness. There was no bowel or bladder incontinence. He denied any chest pain or shortness of breath. In the emergency department, MRI of the brain revealed punctate nonhemorrhagic infarct in the right postcentral gyrus with a second adjacent punctate focus in the adjacent white matter. The were remote lacunar infarcts. CBC was unremarkable. BMP shows creatinine 1.6. Hepatic panel was unremarkable.    Hospital Course:  Stroke: left frontal MCA/ACA punctate infarcts, likely due to small/large vessel disease in the setting of diffuse intracranial athero  MRI Punctate right post central gyrus and adjacent white matter infarcts. chronic microvascular ischemia. Old lacunar infarcts within the right coronal radiata and left cerebellum.  MRA L VA stenosis w/ occluded L V4 segment, R P2, L P3 and L M2 stenoses  CTA neck Occluded L VA at V4, intracranial atherosclerosis  2D Echo No source of embolus , EF 55%  LDL 176, not at goal  HgbA1c 6.1  aspirin 81 mg orally every day prior to admission, now on aspirin 81 mg orally every day and clopidogrel 75 mg orally every day. Given large vessel intracranial atherosclerosis, patient should be treated with aspirin 81 mg and clopidogrel 75 mg orally every day x 3 months for secondary stroke prevention. After 3 months, change to plavix alone. Long-term dual antiplatelets are contraindicated due to risk for intracerebral hemorrhage.   Disposition: refused OP PT  Follow up with Dr. Roda Shutters in 2 months, stroke clinic. Order written.  Hypertension  Home meds: Metoprolol  BP goal 120-140 given posterior circulation stenoses  Wife to start checking BPs at home  Hyperlipidemia  Home meds: lopid  LDL 176, goal < 70  Intolerant to statins  Recommend to follow up with PCP for PCSK9 inhibitors  Procedures:  echo  Consultations:  neuro  Discharge Exam: Filed Vitals:   01/30/15 0532  BP: 120/58  Pulse: 82  Temp: 97.7 F (36.5 C)  Resp: 16    General: A+Ox3,NAD- anxious to go home   Discharge Instructions  Discharge Instructions    Ambulatory referral to Neurology    Complete by:  As directed   Dr. Roda Shutters requests followup in 2 months     Diet - low sodium heart healthy    Complete by:  As directed      Discharge instructions    Complete by:  As directed   Outpatient PT BMP 1 week     Increase activity slowly     Complete by:  As directed           Discharge Medication List as of 01/30/2015 11:57 AM    START taking these medications   Details  clopidogrel (PLAVIX) 75 MG tablet Take 1 tablet (75 mg total) by mouth daily., Starting 01/30/2015, Until Discontinued, Print      CONTINUE these medications which have CHANGED   Details  diltiazem (DILACOR XR) 180 MG 24 hr capsule Take 1 capsule (180 mg total) by mouth daily., Starting 01/30/2015, Until Discontinued, Print      CONTINUE these medications which have NOT CHANGED   Details  acetaminophen (TYLENOL) 325 MG tablet Take 2 tablets (650 mg total) by mouth every 6 (six) hours as needed for mild pain, moderate pain, fever or headache (or Fever >/= 101)., Starting 07/15/2014, Until Discontinued, OTC    aspirin EC 81 MG tablet Take 81 mg by mouth every evening., Until Discontinued, Historical Med    cetirizine (ZYRTEC) 10 MG tablet Take 10 mg by mouth daily as needed for allergies. , Until Discontinued, Historical Med    fluticasone (FLONASE) 50 MCG/ACT nasal spray Place 2 sprays into both nostrils daily as needed for allergies., Starting 10/07/2014, Until Discontinued, Normal    furosemide (LASIX) 40 MG tablet TAKE 1 TABLET BY MOUTH DAILY, Normal    gabapentin (NEURONTIN) 300 MG capsule Take 1 capsule (300 mg total) by mouth 2 (two) times daily., Until Discontinued, Normal    gemfibrozil (LOPID) 600 MG tablet TAKE 1 TABLET BY MOUTH TWICE A DAY, Normal    Hypromellose (ARTIFICIAL TEARS OP) Apply 1 drop to eye daily as needed (itchy / watery eyes)., Until Discontinued, Historical Med    Multiple Vitamins-Minerals (OCUVITE ADULT 50+ PO) Take 1 capsule by mouth daily.  , Until Discontinued, Historical Med    pantoprazole (PROTONIX) 40 MG tablet Take 1 tablet (40 mg total) by mouth 2 (two) times daily before a meal., Starting 07/15/2014, Until Wed 07/15/15, Print    potassium chloride SA (K-DUR,KLOR-CON) 20 MEQ tablet TAKE 1 TABLET BY MOUTH DAILY,  Normal    ranitidine (ZANTAC) 150 MG tablet TAKE 1 TABLET BY MOUTH TWICE A DAY, Normal    sucralfate (CARAFATE) 1 G tablet TAKE 1 TABLET BY MOUTH BEFORE LAYING DOWN FOR NAP OR BEDTIME, Normal    Bevacizumab (AVASTIN IV) Place into the right eye See admin instructions. Every 7 weeks, Until Discontinued, Historical Med    loperamide (IMODIUM) 2 MG capsule Take 1 capsule (2 mg total) by mouth 3 (three) times daily as needed for diarrhea or loose stools., Starting 07/15/2014, Until Discontinued, Print      STOP taking these medications     metoprolol succinate (TOPROL-XL) 25 MG 24 hr tablet      tamsulosin (FLOMAX) 0.4 MG CAPS capsule        Allergies  Allergen Reactions  . Atorvastatin     REACTION: muscles tense up   . Codeine Nausea And Vomiting  . Lisinopril Swelling    Lips and face swelling  . Statins Other (See  Comments)    Muscle tense up   Follow-up Information    Follow up with Xu,Jindong, MD In 2 months.   Specialty:  Neurology   Why:  Stroke Clinic, Office will call you with appointment date & time   Contact information:   [address] [place] [address] [phone]       Follow up with [name], MD In 1 week.   Specialty:  Family Medicine   Contact information:   [address] [place] [address] [phone]        The results of significant diagnostics from this hospitalization (including imaging, microbiology, ancillary and laboratory) are listed below for reference.    Significant Diagnostic Studies: Dg Chest 2 View  01/29/2015   CLINICAL DATA:  Hypertension.  Possible stroke.  EXAM: CHEST  2 VIEW  COMPARISON:  08/15/2014  FINDINGS: The heart size and mediastinal contours are within normal limits. Both lungs are clear. The visualized skeletal structures are unremarkable.  IMPRESSION: No active cardiopulmonary disease.   Electronically Signed   By: Ellery Plunk M.D.   On: 01/29/2015 02:09   Ct Angio Neck W/cm  &/or Wo/cm  01/29/2015   CLINICAL DATA:  81 year old hypertensive male with hyperlipidemia. Acute infarct right posterior frontal -parietal lobe. Subsequent encounter.  EXAM: CT ANGIOGRAPHY NECK  TECHNIQUE: Multidetector CT imaging of the neck was performed using the standard protocol during bolus administration of intravenous contrast. Multiplanar CT image reconstructions and MIPs were obtained to evaluate the vascular anatomy. Carotid stenosis measurements (when applicable) are obtained utilizing NASCET criteria, using the distal internal carotid diameter as the denominator.  CONTRAST:  50mL OMNIPAQUE IOHEXOL 350 MG/ML SOLN  COMPARISON:  01/29/2015 MR angiogram circle of Willis. 01/28/2015 MR brain. No comparison neck MR angiogram or CT angiogram.  FINDINGS: Aortic arch: 3 vessel aortic arch with atherosclerotic type changes. Atherosclerotic type changes origin of the great vessels.  Right carotid system: Atherosclerotic type changes with calcified plaque carotid bifurcation with less than 50% diameter stenosis.  Left carotid system: Mild to slightly moderate narrowing proximal left common carotid artery. Plaque left carotid bifurcation with less than 50% diameter stenosis.  Vertebral arteries:Moderate narrowing origin of the left vertebral artery. Narrowing left vertebral artery at the C1-2 level. Occluded left vertebral artery at the level the foramen magnum (V4).  Right vertebral artery is dominant with mild irregularity without high-grade stenosis.  Skeleton: Cervical spondylotic changes most notable C4-5 through C6-7 level.  Other neck: Complete opacification inferior left mastoid air cells. No obstructing lesion of the eustachian tube is noted.  Fluid filled upper cervical esophagus.  Right hilar adenopathy. Visualized lung apices without worrisome mass identified.  IMPRESSION: Moderate narrowing origin of the left vertebral artery. Narrowing left vertebral artery at the C1-2 level. Occluded left vertebral  artery at the level the foramen magnum (V4).  Right vertebral artery is dominant with mild irregularity without high-grade stenosis.  Plaque carotid bifurcation bilaterally with less than 50% diameter stenosis.  Mild to slightly moderate narrowing proximal left common carotid artery.  Cervical spondylotic changes most notable C4-5 thru C6-7 level.  Complete opacification inferior left mastoid air cells. No obstructing lesion of the eustachian tube is noted.  Fluid filled upper cervical esophagus.  Right hilar adenopathy.   Electronically Signed   By: Lacy Duverney M.D.   On: 01/29/2015 09:10   Mr Brain Wo Contrast  01/28/2015   CLINICAL DATA:  Dizziness beginning at 8 o\'clock a.m. today.  EXAM: MRI  HEAD WITHOUT CONTRAST  TECHNIQUE: Multiplanar, multiecho pulse sequences of the brain and surrounding structures were obtained without intravenous contrast.  COMPARISON:  None.  FINDINGS: A 5 mm acute nonhemorrhagic white matter infarct is present deep to the right post central gyrus. There may be a punctate white matter infarct just below and slightly more anterior to this lesion.  Moderate generalized scratch the moderate bilateral periventricular T2 changes are seen bilaterally. These extend into the brainstem. There is a remote scratch the there are at least 2 remote lacunar infarcts within the right coronal radiata.  No acute hemorrhage or mass lesion is present.  Abnormal signal is evident within the left vertebral artery suggesting slow or occluded flow. There are at least 3 punctate nonhemorrhagic infarcts within the left cerebellum. Flow is present in the anterior circulation.  Bilateral lens extractions are noted. Rightward nasal septal deviation and spurring is noted. The paranasal sinuses are clear. Fluid is noted in the left mastoid air cells. No obstructing nasopharyngeal lesion is present.  IMPRESSION: 1. Punctate nonhemorrhagic white matter infarct deep to the right post central gyrus. A second punctate  adjacent white matter infarct may be present as well. 2. Moderate generalized white matter disease bilaterally suggesting chronic microvascular ischemia. 3. Remote lacunar infarcts within the right coronal radiata and left cerebellum. These results were called by telephone at the time of interpretation on 01/28/2015 at 2:18 pm to Dr. Mancel Bale , who verbally acknowledged these results.   Electronically Signed   By: Marin Roberts M.D.   On: 01/28/2015 14:22   Mr Maxine Glenn Head/brain Wo Cm  01/29/2015   CLINICAL DATA:  Gait instability upon awaking January 28, 2015. Follow-up stroke.  EXAM: MRA HEAD WITHOUT CONTRAST  TECHNIQUE: Angiographic images of the Circle of Willis were obtained using MRA technique without intravenous contrast.  COMPARISON:  MRI eye brain January 28, 2015  FINDINGS: Anterior circulation: Normal flow related enhancement of the included cervical, petrous, cavernous and supra clinoid internal carotid arteries. Mild luminal irregularity of the carotid siphons likely represents atherosclerosis. Patent anterior communicating artery. Flow related enhancement of the anterior and middle cerebral arteries, including more distal segments. Moderate stenosis LEFT M2 origin.  No large vessel occlusion, high-grade stenosis, abnormal luminal irregularity, aneurysm.  Posterior circulation: RIGHT vertebral artery is dominant. The LEFT vertebral artery predominately terminates in the posterior inferior cerebellar artery. However, retrograde flow into distal LEFT V4 segment. Basilar artery is patent, with normal flow related enhancement of the main branch vessels. Flow related enhancement of the posterior cerebral arteries. Moderate to high-grade stenosis proximal RIGHT P2 segment. Moderate stenosis LEFT proximal P3 segment.  No large vessel occlusion, abnormal luminal irregularity, aneurysm.  IMPRESSION: LEFT posterior-inferior cerebellar artery predominately terminates in PICA, with occluded LEFT V4 segment.   Moderate to severe stenosis of RIGHT P2, moderate stenosis proximal LEFT P3.  Moderate stenosis LEFT M2 segment origin.   Electronically Signed   By: Awilda Metro M.D.   On: 01/29/2015 02:33    Microbiology: No results found for this or any previous visit (from the past 240 hour(s)).   Labs: Basic Metabolic Panel:  Recent Labs Lab 01/28/15 1058 01/29/15 1157 01/30/15 0509  NA 138 138 140  K 3.8 3.9 3.6  CL 103 102 103  CO2 25 28 27   GLUCOSE 136* 102* 92  BUN 19 21* 19  CREATININE 1.36* 1.32* 1.38*  CALCIUM 9.3 9.0 9.1   Liver Function Tests:  Recent Labs Lab 01/28/15 1058  AST 23  ALT 12*  ALKPHOS 90  BILITOT 0.4  PROT 7.2  ALBUMIN 3.7   No results for input(s): LIPASE, AMYLASE in the last 168 hours. No results for input(s): AMMONIA in the last 168 hours. CBC:  Recent Labs Lab 01/28/15 1058 01/29/15 1157 01/30/15 0509  WBC 7.2 7.2 10.1  NEUTROABS 5.0  --   --   HGB 13.5 13.3 13.2  HCT 41.6 41.3 40.7  MCV 81.6 81.9 81.6  PLT 261 263 270   Cardiac Enzymes: No results for input(s): CKTOTAL, CKMB, CKMBINDEX, TROPONINI in the last 168 hours. BNP: BNP (last 3 results) No results for input(s): BNP in the last 8760 hours.  ProBNP (last 3 results) No results for input(s): PROBNP in the last 8760 hours.  CBG: No results for input(s): GLUCAP in the last 168 hours.     SignedMarlin Canary  [facility] 01/31/2015, 10:08 AM

## 2015-01-30 NOTE — Progress Notes (Signed)
Physical Therapy Treatment Patient Details Name: [name] MRN: [id number] DOB: 09-01-1925 Today's Date: 01/30/2015    History of Present Illness 81 year old male with a history of hypertension, hyperlipidemia, COPD, diverticulitis, and von Willebrand's disease presented with gait instability when he woke up from sleep on the morning of 01/28/2015. Admitted for acute nonhemorrhagic stroke.    PT Comments    Great progress with physical therapy. Very mild balance deficits noted today with higher level balance activities. Reviewed signs/symptoms of stroke with patient and his wife. Adequate for d/c from a mobility standpoint when medically ready.  Follow Up Recommendations  Outpatient PT     Equipment Recommendations  None recommended by PT    Recommendations for Other Services       Precautions / Restrictions Precautions Precautions: Fall Restrictions Weight Bearing Restrictions: No    Mobility  Bed Mobility Overal bed mobility: Modified Independent                Transfers Overall transfer level: Needs assistance Equipment used: None Transfers: Sit to/from Stand Sit to Stand: Supervision         General transfer comment: Improved stability today. Did not require leaning on bed for support. Supervision for safety.  Ambulation/Gait Ambulation/Gait assistance: Supervision Ambulation Distance (Feet): 350 Feet Assistive device: None Gait Pattern/deviations: Step-through pattern   Gait velocity interpretation: at or above normal speed for age/gender General Gait Details: Significantly improved stability today. Tolerated dynamic gait challenges with minimal balance difficulty. Able to perform quick turns, variable speed, high marching, and backward stepping. No assist needed throughout bout and did not require SPC for support.   Stairs Stairs: Yes Stairs assistance: Modified independent (Device/Increase time) Stair Management: No rails;Forwards Number of  Stairs: 5 (x2) General stair comments: Safely navigated stairs.  Wheelchair Mobility    Modified Rankin (Stroke Patients Only) Modified Rankin (Stroke Patients Only) Pre-Morbid Rankin Score: No symptoms Modified Rankin: Moderately severe disability     Balance     Sitting balance-Leahy Scale: Good       Standing balance-Leahy Scale: Good                      Cognition Arousal/Alertness: Awake/alert Behavior During Therapy: WFL for tasks assessed/performed Overall Cognitive Status: Within Functional Limits for tasks assessed                      Exercises      General Comments General comments (skin integrity, edema, etc.): Reviewed signs/symptoms of stroke using FAST acronym with patient and his wife. Both verbailze understanding and we discussed modifiable risk factors of stroke.      Pertinent Vitals/Pain Pain Assessment: No/denies pain    Home Living                      Prior Function            PT Goals (current goals can now be found in the care plan section) Acute Rehab PT Goals Patient Stated Goal: Go home today PT Goal Formulation: With patient Time For Goal Achievement: 02/12/15 Potential to Achieve Goals: Good Progress towards PT goals: Progressing toward goals    Frequency  Min 4X/week    PT Plan Current plan remains appropriate    Co-evaluation             End of Session   Activity Tolerance: Patient tolerated treatment well Patient left: in chair;with call bell/phone within reach;with chair  alarm set;with family/visitor present     Time: 1001-1017 PT Time Calculation (min) (ACUTE ONLY): 16 min  Charges:  $Gait Training: 8-22 mins                    G Codes:      Berton Mount 02/15/15, 11:11 AM Charlsie Merles, PT [phone]

## 2015-01-30 NOTE — Progress Notes (Signed)
  Echocardiogram 2D Echocardiogram has been performed.  [name] [name] 01/30/2015, 9:22 AM

## 2015-01-30 NOTE — Progress Notes (Addendum)
STROKE TEAM PROGRESS NOTE   HISTORY [name] is an 81 y.o. male who was brought to ED due to sensation of dizziness and gait instability. He was feeling normal yesterday and was able to do his yard work. Last night he felt "funny" but cannot explain what that means. He was up and down a few times during the night due to inability to sleep. When he awoke at 0800 and tried to walk to the bathroom he was unable to keep in a straight line (LKW 01/27/2015 0800). He did not feel vertigo but felt off balance. At one point his legs gave out on him and wife had a hard time getting him up to the chair. Wife did take his BP and noted it was systolic 150's but when EMS arrived he was 202 systolic. Currently he is not feeling dizzy or off balance but does feel light headed transiently when sitting up. MRI was obtained and did show a nonhemorrhagic infarct in the white matter in the right post central gyrus. Patietn states he does take ASA daily. Neurology was consulted. Modified Rankin: Rankin Score=0. Patient was not administered TPA secondary to out of window. He was admitted for further evaluation and treatment.   SUBJECTIVE (INTERVAL HISTORY) His wife is at the beside. Patient more awake today than yesterday. He is HOH at baseline.    OBJECTIVE Temp:  [97.6 F (36.4 C)-98.2 F (36.8 C)] 97.7 F (36.5 C) (06/10 0532) Pulse Rate:  [69-84] 82 (06/10 0532) Cardiac Rhythm:  [-] Normal sinus rhythm (06/10 0800) Resp:  [16-18] 16 (06/10 0532) BP: (94-157)/(46-71) 120/58 mmHg (06/10 0532) SpO2:  [91 %-96 %] 96 % (06/10 0532) Weight:  [72.303 kg (159 lb 6.4 oz)] 72.303 kg (159 lb 6.4 oz) (06/09 1038)  No results for input(s): GLUCAP in the last 168 hours.  Recent Labs Lab 01/28/15 1058 01/29/15 1157 01/30/15 0509  NA 138 138 140  K 3.8 3.9 3.6  CL 103 102 103  CO2 25 28 27   GLUCOSE 136* 102* 92  BUN 19 21* 19  CREATININE 1.36* 1.32* 1.38*  CALCIUM 9.3 9.0 9.1    Recent Labs Lab  01/28/15 1058  AST 23  ALT 12*  ALKPHOS 90  BILITOT 0.4  PROT 7.2  ALBUMIN 3.7    Recent Labs Lab 01/28/15 1058 01/29/15 1157 01/30/15 0509  WBC 7.2 7.2 10.1  NEUTROABS 5.0  --   --   HGB 13.5 13.3 13.2  HCT 41.6 41.3 40.7  MCV 81.6 81.9 81.6  PLT 261 263 270   No results for input(s): CKTOTAL, CKMB, CKMBINDEX, TROPONINI in the last 168 hours.  Recent Labs  01/28/15 1058  LABPROT 14.3  INR 1.09    Recent Labs  01/28/15 1033  COLORURINE YELLOW  LABSPEC 1.008  PHURINE 5.5  GLUCOSEU NEGATIVE  HGBUR NEGATIVE  BILIRUBINUR NEGATIVE  KETONESUR NEGATIVE  PROTEINUR NEGATIVE  UROBILINOGEN 0.2  NITRITE NEGATIVE  LEUKOCYTESUR NEGATIVE       Component Value Date/Time   CHOL 240* 01/29/2015 0441   TRIG 141 01/29/2015 0441   HDL 36* 01/29/2015 0441   CHOLHDL 6.7 01/29/2015 0441   VLDL 28 01/29/2015 0441   LDLCALC 176* 01/29/2015 0441   Lab Results  Component Value Date   HGBA1C 6.1* 01/29/2015   No results found for: LABOPIA, COCAINSCRNUR, LABBENZ, AMPHETMU, THCU, LABBARB  No results for input(s): ETH in the last 168 hours.  Dg Chest 2 View 01/29/2015    No active cardiopulmonary disease.  Mr Brain Wo Contrast 01/28/2015    1. Punctate nonhemorrhagic white matter infarct deep to the right post central gyrus. A second punctate adjacent white matter infarct may be present as well. 2. Moderate generalized white matter disease bilaterally suggesting chronic microvascular ischemia. 3. Remote lacunar infarcts within the right coronal radiata and left cerebellum.   Mr Maxine Glenn Head/brain Wo Cm 01/29/2015   LEFT posterior-inferior cerebellar artery predominately terminates in PICA, with occluded LEFT V4 segment.  Moderate to severe stenosis of RIGHT P2, moderate stenosis proximal LEFT P3.  Moderate stenosis LEFT M2 segment origin.     Ct Angio Neck W/cm &/or Wo/cm 01/29/2015   Moderate narrowing origin of the left vertebral artery. Narrowing left vertebral artery at the C1-2  level. Occluded left vertebral artery at the level the foramen magnum (V4).  Right vertebral artery is dominant with mild irregularity without high-grade stenosis.  Plaque carotid bifurcation bilaterally with less than 50% diameter stenosis.  Mild to slightly moderate narrowing proximal left common carotid artery.  Cervical spondylotic changes most notable C4-5 thru C6-7 level.  Complete opacification inferior left mastoid air cells. No obstructing lesion of the eustachian tube is noted.  Fluid filled upper cervical esophagus.  Right hilar adenopathy.   2D echo  - Left ventricle: The cavity size was normal. Wall thickness wasincreased in a pattern of mild LVH. The estimated ejectionfraction was 55%. Wall motion was normal; there were no regionalwall motion abnormalities. - Right ventricle: The cavity size was mildly dilated. Systolicfunction was mildly reduced. - Pulmonary arteries: PA peak pressure: 37 mm Hg (S). Impressions: No cardiac source of embolism was identified, butcannot be ruled out on the basis of this examination.   PHYSICAL EXAM  Temp:  [97.6 F (36.4 C)-98.2 F (36.8 C)] 97.7 F (36.5 C) (06/10 0532) Pulse Rate:  [69-84] 82 (06/10 0532) Resp:  [16-18] 16 (06/10 0532) BP: (94-157)/(46-71) 120/58 mmHg (06/10 0532) SpO2:  [91 %-96 %] 96 % (06/10 0532) Weight:  [72.303 kg (159 lb 6.4 oz)] 72.303 kg (159 lb 6.4 oz) (06/09 1038)  General - Well nourished, well developed, in no apparent distress.  Ophthalmologic - Fundi not visualized due to eye movement.  Cardiovascular - Regular rate and rhythm.  Mental Status -  Level of arousal and orientation to time, place, and person were intact. Language including expression, naming, repetition, comprehension was assessed and found intact.  Cranial Nerves II - XII - II - Visual field intact OU. III, IV, VI - Extraocular movements intact. V - Facial sensation intact bilaterally. VII - Facial movement intact bilaterally. VIII -  Hard of hearing & vestibular intact bilaterally. X - Palate elevates symmetrically. XI - Chin turning & shoulder shrug intact bilaterally. XII - Tongue protrusion intact.  Motor Strength - The patient's strength was normal in all extremities and pronator drift was absent.  Bulk was normal and fasciculations were absent.   Motor Tone - Muscle tone was assessed at the neck and appendages and was normal.  Reflexes - The patient's reflexes were 1+ in all extremities and he had no pathological reflexes.  Sensory - Light touch, temperature/pinprick were assessed and were symmetrical.    Coordination - The patient had normal movements in the hands and feet with no ataxia or dysmetria.  Tremor was absent.  Gait and Station - not tested due to safety concerns and PT is going to work with him.    ASSESSMENT/PLAN [name] is a 81 y.o. male with history of hypertension,  hyperlipidemia, COPD, diverticulitis, and von Willebrand's disease presenting with dizziness and gait instability. He did not receive IV t-PA due to delay in arrival.   Stroke:  left frontal MCA/ACA punctate infarcts, likely due to small/large vessel disease in the setting of diffuse intracranial athero  MRI  Punctate right post central gyrus and adjacent white matter infarcts. chronic microvascular ischemia. Old lacunar infarcts within the right coronal radiata and left cerebellum.   MRA  L VA stenosis w/ occluded L V4 segment, R P2, L P3 and L M2 stenoses  CTA neck  Occluded L VA at V4, intracranial atherosclerosis  2D Echo  No source of embolus , EF 55%  LDL 176, not at goal  HgbA1c 6.1  Lovenox 40 mg sq daily for VTE prophylaxis Diet Heart Room service appropriate?: Yes; Fluid consistency:: Thin  aspirin 81 mg orally every day prior to admission, now on aspirin 81 mg orally every day and clopidogrel 75 mg orally every day. Given large vessel intracranial atherosclerosis, patient should be treated with aspirin 81  mg and clopidogrel 75 mg orally every day x 3 months for secondary stroke prevention. After 3 months, change to plavix alone. Long-term dual antiplatelets are contraindicated due to risk for intracerebral hemorrhage.   Patient counseled to be compliant with his antithrombotic medications  Ongoing aggressive stroke risk factor management  Therapy recommendations:  OP PT, no OT. Cane recommended  Disposition:  Home with OP therapy  Follow up with Dr. Roda Shutters in 2 months, stroke clinic. Order written.  Hypertension  Home meds:   Metoprolol  BP goal 120-140 given posterior circulation stenoses  Wife to start checking BPs at home  Avoid hypotension  Hyperlipidemia  Home meds:  lopid  LDL 176, goal < 70  Intolerant to statins  Recommend to follow up with PCP for PCSK9 inhibitors   Other Stroke Risk Factors  Advanced age  Former Cigarette smoker, quit smoking years ago   Other Active Problems  CKD stage III  GERD/PUD  COPD  Hx of GIB due to ulcer - pt stated that was related to his smoking  Hospital day # 2  [facility] See Amion for Pager information 01/30/2015 8:59 AM   I, the attending vascular neurologist, have personally obtained a history, examined the patient, evaluated laboratory data, individually viewed imaging studies and agree with radiology interpretations. Together with the NP/PA, we formulated the assessment and plan of care which reflects our mutual decision. I have made any additions or clarifications directly to the above note and agree with the findings and plan as currently documented.   81 yo M with hx of GI bleeding, HTN, HLD admitted for right frontal MCA/ACA punctate infarcts. Stroke work up showed left VA occlusion and intracranial stenosis. LDL was high at 176 but intolerate to statins. Etiology likely due to atherosclerosis. Recommend dural antiplatelet for 3 months. Watch for GIB. Avoid hypotension. Recommend follow up  with PCP to consider PCSK9 inhibitors.   Neurology will sign off. Please call with questions. Pt will follow up with Dr. Roda Shutters at [facility] in about 2 months. Thanks for the consult.  Marvel Plan, MD PhD Stroke Neurology 01/30/2015 4:29 PM   To contact Stroke Continuity provider, please refer to [email or web address]. After hours, contact General Neurology

## 2015-01-30 NOTE — Progress Notes (Signed)
Occupational Therapy Treatment Patient Details Name: [name] MRN: [id number] DOB: 03-17-1926 Today's Date: 01/30/2015    History of present illness 81 year old male with a history of hypertension, hyperlipidemia, COPD, diverticulitis, and von Willebrand's disease presented with gait instability when he woke up from sleep on the morning of 01/28/2015. Admitted for acute nonhemorrhagic stroke.   OT comments  Pt. Able to complete higher level dynamic standing balance tasks in room including reaching and gathering items from multiple heights with no LOB noted.  Eager for d/c home. Doing great! Will notify OTR/L to sign off  Follow Up Recommendations  Supervision/Assistance - 24 hour;No OT follow up    Equipment Recommendations  None recommended by OT    Recommendations for Other Services      Precautions / Restrictions Precautions Precautions: Fall Restrictions Weight Bearing Restrictions: No       Mobility Bed Mobility                  Transfers Overall transfer level: Modified independent Equipment used: None Transfers: Sit to/from Stand Sit to Stand: Modified independent (Device/Increase time)              Balance     Sitting balance-Leahy Scale: Good       Standing balance-Leahy Scale: Good                     ADL                                         General ADL Comments: had pt. complete higher level dynamic standing balance tasks in preparation for d/c home.  able to amb. in room without assistive device and carry items in one and both hands and place on various heights no lob noted. reached upright and was able to maintain balance on B les .  also able to reach and gather items off of floor with no LOB noted.  eager for d/c home      Vision                     Perception     Praxis      Cognition   Behavior During Therapy: [facility] for tasks assessed/performed                          Extremity/Trunk Assessment               Exercises     Shoulder Instructions       General Comments      Pertinent Vitals/ Pain       Pain Assessment: No/denies pain  Home Living                                          Prior Functioning/Environment              Frequency Min 2X/week     Progress Toward Goals  OT Goals(current goals can now be found in the care plan section)  Progress towards OT goals: Progressing toward goals     Plan Discharge plan remains appropriate    Co-evaluation                 End of Session  Equipment Utilized During Treatment: Gait belt   Activity Tolerance Patient tolerated treatment well   Patient Left in chair;with call bell/phone within reach;with family/visitor present   Nurse Communication          Time: [phone] OT Time Calculation (min): 11 min  Charges: OT General Charges $OT Visit: 1 Procedure OT Treatments $Therapeutic Activity: 8-22 mins  Robet Leu, COTA/L 01/30/2015, 10:42 AM

## 2015-02-05 ENCOUNTER — Ambulatory Visit (INDEPENDENT_AMBULATORY_CARE_PROVIDER_SITE_OTHER): Payer: Medicare HMO | Admitting: Family Medicine

## 2015-02-05 ENCOUNTER — Encounter: Payer: Self-pay | Admitting: Family Medicine

## 2015-02-05 VITALS — BP 134/80 | HR 70 | Temp 98.5°F | Wt 159.5 lb

## 2015-02-05 DIAGNOSIS — R799 Abnormal finding of blood chemistry, unspecified: Secondary | ICD-10-CM

## 2015-02-05 DIAGNOSIS — I639 Cerebral infarction, unspecified: Secondary | ICD-10-CM | POA: Diagnosis not present

## 2015-02-05 DIAGNOSIS — R7989 Other specified abnormal findings of blood chemistry: Secondary | ICD-10-CM

## 2015-02-05 NOTE — Progress Notes (Signed)
Pre visit review using our clinic review tool, if applicable. No additional management support is needed unless otherwise documented below in the visit note.  Hospital f/u for CVA.  He "just didn't feel right" and went to hospital, with small CVA noted.  No new focal neuro changes, but still fatigued. No new motor changes, no dysphagia. Gait at baseline.  D/w pt about med options and rationale, esp re: ASA/plavix for 3 months, then plavix.  Also d/w pt about cholesterol tx options.  He would like to proceed with praluent.  Statin intolerant.   Hospital course d/w pt.  Also due for f/u BMET since Cr slightly elevated as inpatient.    PMH and SH reviewed  ROS: See HPI, otherwise noncontributory.  Meds, vitals, and allergies reviewed.   GEN: nad, alert and oriented HEENT: mucous membranes moist NECK: supple w/o LA CV: rrr PULM: ctab, no inc wob ABD: soft, +bs EXT: no edema SKIN: no acute rash CN 2-12 wnl B, S/S/DTR wnl x4

## 2015-02-05 NOTE — Patient Instructions (Signed)
Don't change your meds for now.  Take aspirin and plavix for 3 months.  After 04/2015, you'll change to just plavix (and stop the aspirin). Praluent is the new cholesterol medicine and I'll see about getting that set up.  It would be an injection every other week, likely here at the clinic.  Go to the lab on the way out.  We'll contact you with your lab report. Take care.  Glad to see you.

## 2015-02-06 ENCOUNTER — Encounter: Payer: Self-pay | Admitting: Family Medicine

## 2015-02-06 ENCOUNTER — Telehealth: Payer: Self-pay | Admitting: Family Medicine

## 2015-02-06 ENCOUNTER — Emergency Department (HOSPITAL_COMMUNITY)
Admission: EM | Admit: 2015-02-06 | Discharge: 2015-02-06 | Disposition: A | Discharge status: Home / Self Care | Payer: Medicare HMO | Attending: Emergency Medicine | Admitting: Emergency Medicine

## 2015-02-06 ENCOUNTER — Emergency Department (HOSPITAL_COMMUNITY): Payer: Medicare HMO

## 2015-02-06 DIAGNOSIS — G459 Transient cerebral ischemic attack, unspecified: Secondary | ICD-10-CM | POA: Insufficient documentation

## 2015-02-06 DIAGNOSIS — R42 Dizziness and giddiness: Secondary | ICD-10-CM | POA: Diagnosis present

## 2015-02-06 DIAGNOSIS — E785 Hyperlipidemia, unspecified: Secondary | ICD-10-CM

## 2015-02-06 DIAGNOSIS — Z862 Personal history of diseases of the blood and blood-forming organs and certain disorders involving the immune mechanism: Secondary | ICD-10-CM | POA: Insufficient documentation

## 2015-02-06 DIAGNOSIS — K2270 Barrett's esophagus without dysplasia: Secondary | ICD-10-CM | POA: Insufficient documentation

## 2015-02-06 DIAGNOSIS — Z79899 Other long term (current) drug therapy: Secondary | ICD-10-CM | POA: Insufficient documentation

## 2015-02-06 DIAGNOSIS — Z87891 Personal history of nicotine dependence: Secondary | ICD-10-CM | POA: Insufficient documentation

## 2015-02-06 DIAGNOSIS — H9190 Unspecified hearing loss, unspecified ear: Secondary | ICD-10-CM | POA: Diagnosis not present

## 2015-02-06 DIAGNOSIS — Q2733 Arteriovenous malformation of digestive system vessel: Secondary | ICD-10-CM | POA: Diagnosis not present

## 2015-02-06 DIAGNOSIS — Z8639 Personal history of other endocrine, nutritional and metabolic disease: Secondary | ICD-10-CM | POA: Insufficient documentation

## 2015-02-06 DIAGNOSIS — I10 Essential (primary) hypertension: Secondary | ICD-10-CM | POA: Insufficient documentation

## 2015-02-06 DIAGNOSIS — Z7902 Long term (current) use of antithrombotics/antiplatelets: Secondary | ICD-10-CM | POA: Diagnosis not present

## 2015-02-06 DIAGNOSIS — K219 Gastro-esophageal reflux disease without esophagitis: Secondary | ICD-10-CM | POA: Insufficient documentation

## 2015-02-06 DIAGNOSIS — Z87438 Personal history of other diseases of male genital organs: Secondary | ICD-10-CM | POA: Insufficient documentation

## 2015-02-06 LAB — CBC WITH DIFFERENTIAL/PLATELET
Basophils Absolute: 0 10*3/uL (ref 0.0–0.1)
Basophils Relative: 1 % (ref 0–1)
Eosinophils Absolute: 0.3 10*3/uL (ref 0.0–0.7)
Eosinophils Relative: 4 % (ref 0–5)
HCT: 38.2 % — ABNORMAL LOW (ref 39.0–52.0)
Hemoglobin: 12.5 g/dL — ABNORMAL LOW (ref 13.0–17.0)
Lymphocytes Relative: 22 % (ref 12–46)
Lymphs Abs: 1.5 10*3/uL (ref 0.7–4.0)
MCH: 26.5 pg (ref 26.0–34.0)
MCHC: 32.7 g/dL (ref 30.0–36.0)
MCV: 80.9 fL (ref 78.0–100.0)
Monocytes Absolute: 0.7 10*3/uL (ref 0.1–1.0)
Monocytes Relative: 11 % (ref 3–12)
Neutro Abs: 4.1 10*3/uL (ref 1.7–7.7)
Neutrophils Relative %: 62 % (ref 43–77)
Platelets: 283 10*3/uL (ref 150–400)
RBC: 4.72 MIL/uL (ref 4.22–5.81)
RDW: 14.4 % (ref 11.5–15.5)
WBC: 6.5 10*3/uL (ref 4.0–10.5)

## 2015-02-06 LAB — BASIC METABOLIC PANEL
Anion gap: 8 (ref 5–15)
BUN: 20 mg/dL (ref 6–23)
BUN: 19 mg/dL (ref 6–20)
CO2: 28 mEq/L (ref 19–32)
CO2: 25 mmol/L (ref 22–32)
Calcium: 9.6 mg/dL (ref 8.4–10.5)
Calcium: 8.8 mg/dL — ABNORMAL LOW (ref 8.9–10.3)
Chloride: 100 mEq/L (ref 96–112)
Chloride: 105 mmol/L (ref 101–111)
Creatinine, Ser: 1.21 mg/dL (ref 0.40–1.50)
Creatinine, Ser: 1.16 mg/dL (ref 0.61–1.24)
GFR calc Af Amer: >60 mL/min (ref >60)
GFR calc non Af Amer: 55 mL/min — ABNORMAL LOW (ref >60)
GFR: 60.25 mL/min (ref >60.00)
Glucose, Bld: 82 mg/dL (ref 70–99)
Glucose, Bld: 100 mg/dL — ABNORMAL HIGH (ref 65–99)
Potassium: 4.1 mEq/L (ref 3.5–5.1)
Potassium: 3.9 mmol/L (ref 3.5–5.1)
Sodium: 135 mEq/L (ref 135–145)
Sodium: 138 mmol/L (ref 135–145)

## 2015-02-06 MED ORDER — ALIROCUMAB 75 MG/ML ~~LOC~~ SOPN: 75.0000 mg | PEN_INJECTOR | SUBCUTANEOUS | Status: DC

## 2015-02-06 MED ORDER — SODIUM CHLORIDE 0.9 % IV BOLUS (SEPSIS)
1000.0000 mL | Freq: Once | INTRAVENOUS | Status: AC
  Administered 2015-02-06: 1000 mL via INTRAVENOUS

## 2015-02-06 NOTE — Telephone Encounter (Signed)
FYI- patient is going to need to get started on praluent.  Injection every 14 days, likely done here at [facility] at RN visit initially.  Will likely need PA.  Rx sent to pharmacy in meantime.  Thanks.

## 2015-02-06 NOTE — Assessment & Plan Note (Signed)
ASA/plavix for 3 months (until 04/2015), then plavix alone.  D/w pt.  He agrees.  Will work to get started on praluent.   Continue other meds for now BP.  BP okay.  Check BMET today, to recheck Cr.  Will notify pt.  He agrees with plan.  >25 minutes spent in face to face time with patient, >50% spent in counselling or coordination of care

## 2015-02-06 NOTE — Discharge Instructions (Signed)
If you were given medicines take as directed.  If you are on coumadin or contraceptives realize their levels and effectiveness is altered by many different medicines.  If you have any reaction (rash, tongues swelling, other) to the medicines stop taking and see a physician.    If your blood pressure was elevated in the ER make sure you follow up for management with a primary doctor or return for chest pain, shortness of breath or stroke symptoms.  Please follow up as directed and return to the ER or see a physician for new or worsening symptoms.  Thank you. Filed Vitals:   02/06/15 1815 02/06/15 1924  BP: 170/77   Pulse: 64   Temp: 98.6 F (37 C) 98.5 F (36.9 C)  TempSrc: Oral   Resp: 18   Weight: 159 lb (72.122 kg)   SpO2: 98%

## 2015-02-06 NOTE — ED Notes (Signed)
Pt to ED via [facility].  Pt was discharged from hosp on 6/8 after being dx with TIA,  Pt c/o every time he stands or sits up too fast he becomes very dizzy.  Neuro exam neg at this time

## 2015-02-06 NOTE — ED Provider Notes (Signed)
CSN: [id number]     Arrival date & time 02/06/15  1735 History   First MD Initiated Contact with Patient 02/06/15 1737     Chief Complaint  Patient presents with  . Dizziness     (Consider location/radiation/quality/duration/timing/severity/associated sxs/prior Treatment) HPI Comments: 81 year old male with recent admission for stroke, history of von Willebrand's, hypercholesterol, and Plavix, umbilical hernia, COPD, memory changes presents with lightheadedness/dizziness worth was standing. Patient has had this since he left the hospital. Patient denies any focal new deficits. Patient feels he has been tolerating oral well. No chest pain or shortness of breath. No significant headache. Improves with sitting down. Former smoker.  Patient is a 81 y.o. male presenting with dizziness. The history is provided by the patient.  Dizziness Associated symptoms: no chest pain, no headaches, no shortness of breath and no vomiting     Past Medical History  Diagnosis Date  . Barrett esophagus 09/2003  . GERD (gastroesophageal reflux disease)   . Hemorrhoids   . Diverticulosis   . Hyperlipidemia   . Hypertension   . Von Willebrand disease   . PUD (peptic ulcer disease)   . Hearing loss   . GI AVM (gastrointestinal arteriovenous vascular malformation)   . History of prostatitis   . Hemorrhoids   . Macular degeneration, wet     receiving intra-ocular injections ([facility])  . Back pain     improved with gabapentin as of 2015  . Stroke    Past Surgical History  Procedure Laterality Date  . Inguinal hernia repair  2001  . Laparoscopic appendectomy N/A 07/07/2014    Procedure: APPENDECTOMY LAPAROSCOPIC;  Surgeon: Manus Rudd, MD;  Location: [facility] OR;  Service: General;  Laterality: N/A;   Family History  Problem Relation Age of Onset  . Heart attack Father   . Coronary artery disease Father   . Hypertension Mother   . Pancreatic cancer Mother     Died at 37.  . Cancer Mother     Pancreatic  cancer  . Arthritis Sister   . Hyperlipidemia Sister   . Prostate cancer Brother   . Cancer Brother     Bone Cancer secondary to Prostate Cancer  . Diabetes Neg Hx    History  Substance Use Topics  . Smoking status: Former Smoker -- 1.00 packs/day for 36 years    Types: Cigarettes  . Smokeless tobacco: Never Used     Comment: Quit in 1983  . Alcohol Use: No    Review of Systems  Constitutional: Negative for fever and chills.  HENT: Negative for congestion.   Eyes: Negative for visual disturbance.  Respiratory: Negative for shortness of breath.   Cardiovascular: Negative for chest pain.  Gastrointestinal: Negative for vomiting and abdominal pain.  Genitourinary: Negative for dysuria and flank pain.  Musculoskeletal: Negative for back pain, neck pain and neck stiffness.  Skin: Negative for rash.  Neurological: Positive for dizziness and light-headedness. Negative for headaches.      Allergies  Atorvastatin; Codeine; Lisinopril; and Statins  Home Medications   Prior to Admission medications   Medication Sig Start Date End Date Taking? Authorizing Provider  acetaminophen (TYLENOL) 325 MG tablet Take 2 tablets (650 mg total) by mouth every 6 (six) hours as needed for mild pain, moderate pain, fever or headache (or Fever >/= 101). 07/15/14  Yes Elease Etienne, MD  aspirin EC 81 MG tablet Take 81 mg by mouth every evening.   Yes Historical Provider, MD  Bevacizumab (AVASTIN IV) Place into  the right eye See admin instructions. Place into the right eye every 7 weeks. Last dose was on 01-13-15   Yes Historical Provider, MD  cetirizine (ZYRTEC) 10 MG tablet Take 10 mg by mouth daily as needed for allergies.    Yes Historical Provider, MD  clopidogrel (PLAVIX) 75 MG tablet Take 1 tablet (75 mg total) by mouth daily. 01/30/15  Yes Joseph Art, DO  diltiazem (DILACOR XR) 180 MG 24 hr capsule Take 1 capsule (180 mg total) by mouth daily. 01/30/15  Yes Jessica U Vann, DO  fluticasone  (FLONASE) 50 MCG/ACT nasal spray Place 2 sprays into both nostrils daily as needed for allergies. 10/07/14  Yes Joaquim Nam, MD  furosemide (LASIX) 40 MG tablet TAKE 1 TABLET BY MOUTH DAILY 11/20/14  Yes Joaquim Nam, MD  gabapentin (NEURONTIN) 300 MG capsule Take 1 capsule (300 mg total) by mouth 2 (two) times daily.   Yes Joaquim Nam, MD  gemfibrozil (LOPID) 600 MG tablet TAKE 1 TABLET BY MOUTH TWICE A DAY 07/31/14  Yes Joaquim Nam, MD  Hypromellose (ARTIFICIAL TEARS OP) Apply 1 drop to eye daily as needed (itchy / watery eyes).   Yes Historical Provider, MD  loperamide (IMODIUM) 2 MG capsule Take 1 capsule (2 mg total) by mouth 3 (three) times daily as needed for diarrhea or loose stools. 07/15/14  Yes Elease Etienne, MD  Multiple Vitamins-Minerals (OCUVITE ADULT 50+ PO) Take 1 capsule by mouth daily.     Yes Historical Provider, MD  pantoprazole (PROTONIX) 40 MG tablet Take 1 tablet (40 mg total) by mouth 2 (two) times daily before a meal. 07/15/14 07/15/15 Yes Elease Etienne, MD  potassium chloride SA (K-DUR,KLOR-CON) 20 MEQ tablet TAKE 1 TABLET BY MOUTH DAILY 07/03/14  Yes Joaquim Nam, MD  ranitidine (ZANTAC) 150 MG tablet TAKE 1 TABLET BY MOUTH TWICE A DAY 12/24/14  Yes Joaquim Nam, MD  sucralfate (CARAFATE) 1 G tablet TAKE 1 TABLET BY MOUTH BEFORE LAYING DOWN FOR NAP OR BEDTIME 11/24/14  Yes Joaquim Nam, MD  Alirocumab (PRALUENT) 75 MG/ML SOPN Inject 75 mg into the skin every 14 (fourteen) days. Patient not taking: Reported on 02/06/2015 02/06/15   Joaquim Nam, MD   BP 170/77 mmHg  Pulse 64  Temp(Src) 98.5 F (36.9 C) (Oral)  Resp 18  Wt 159 lb (72.122 kg)  SpO2 98% Physical Exam  Constitutional: He is oriented to person, place, and time. He appears well-developed and well-nourished.  HENT:  Head: Normocephalic and atraumatic.  Dry mucous membranes  Eyes: Conjunctivae are normal. Right eye exhibits no discharge. Left eye exhibits no discharge.  Neck:  Normal range of motion. Neck supple. No tracheal deviation present.  Cardiovascular: Normal rate and regular rhythm.   Pulmonary/Chest: Effort normal and breath sounds normal.  Abdominal: Soft. He exhibits no distension. There is no tenderness. There is no guarding.  Musculoskeletal: He exhibits no edema.  Neurological: He is alert and oriented to person, place, and time. Coordination normal.  5+ strength in UE and LE with f/e at major joints. Sensation to palpation intact in UE and LE. CNs 2-12 grossly intact.  EOMFI.  PERRL.   Finger nose and coordination intact bilateral.   Visual fields intact to finger testing.   Skin: Skin is warm. No rash noted.  Psychiatric: He has a normal mood and affect.  Nursing note and vitals reviewed.   ED Course  Procedures (including critical care time) Labs Review  Labs Reviewed  CBC WITH DIFFERENTIAL/PLATELET - Abnormal; Notable for the following:    Hemoglobin 12.5 (*)    HCT 38.2 (*)    All other components within normal limits  BASIC METABOLIC PANEL - Abnormal; Notable for the following:    Glucose, Bld 100 (*)    Calcium 8.8 (*)    GFR calc non Af Amer 55 (*)    All other components within normal limits    Imaging Review Ct Head Wo Contrast  02/06/2015   CLINICAL DATA:  Dizziness for 1 week  EXAM: CT HEAD WITHOUT CONTRAST  TECHNIQUE: Contiguous axial images were obtained from the base of the skull through the vertex without intravenous contrast.  COMPARISON:  Brain MRI January 28, 2015  FINDINGS: There is mild diffuse atrophy. There is no intracranial mass, hemorrhage, extra-axial fluid collection, or midline shift. 4 there is small vessel disease throughout the centra semiovale bilaterally. Several small lacunar type infarcts are noted in these areas. There is small vessel disease in the basilar perforator distribution in the mid pons. No acute appearing infarct is identified on this examination. The bony calvarium appears intact. There is diffuse  opacification of the mastoid air cells on the left, a finding noted on prior MR.  IMPRESSION: Atrophy with extensive small vessel disease, stable. No acute appearing infarct evident by CT. No hemorrhage or mass effect. Diffuse mastoid disease on the left, also noted on recent MR.   Electronically Signed   By: Bretta Bang III M.D.   On: 02/06/2015 18:39     EKG Interpretation   Date/Time:  Friday February 06 2015 17:55:33 EDT Ventricular Rate:  62 PR Interval:  183 QRS Duration: 100 QT Interval:  409 QTC Calculation: 415 R Axis:   44 Text Interpretation:  Sinus rhythm Confirmed by Raekwon Winkowski  MD, Rayley Gao ([id number])  on 02/06/2015 6:19:49 PM      MDM   Final diagnoses:  TIA (transient ischemic attack)  Lightheadedness    Well-appearing patient with recent stroke admission presents with concern for orthostatic/lightheadedness presentation. No focal neuro deficits. Patient had recent workup is on Plavix. Plan for CT head look for any swelling or bleeding from recent stroke. Screening blood work and EKG. Patient smiling and joking in the room. If no acute findings in the ER for feel patient is stable for close continued outpatient follow-up. IV fluid bolus.  No acute findings in ER workup. Patient stable for close outpatient follow-up. Normal neuro exam. CT head results reviewed no acute bleeding or swelling. Mild elevated blood pressure which can be followed up outpatient. Results and differential diagnosis were discussed with the patient/parent/guardian. Close follow up outpatient was discussed, comfortable with the plan.   Medications  sodium chloride 0.9 % bolus 1,000 mL (1,000 mLs Intravenous New Bag/Given 02/06/15 1858)    Filed Vitals:   02/06/15 1815 02/06/15 1924  BP: 170/77   Pulse: 64   Temp: 98.6 F (37 C) 98.5 F (36.9 C)  TempSrc: Oral   Resp: 18   Weight: 159 lb (72.122 kg)   SpO2: 98%     Final diagnoses:  TIA (transient ischemic attack)  Lightheadedness        Blane Ohara, MD 02/06/15 1934

## 2015-02-06 NOTE — Telephone Encounter (Signed)
I have received the PA request from [facility].  I need to get the strength that Dr. Para March wants in order to proceed with the PA.  Will do so upon getting that info.

## 2015-02-08 NOTE — Telephone Encounter (Signed)
75 mg into the skin every 14 days. - Subcutaneous

## 2015-02-09 ENCOUNTER — Other Ambulatory Visit: Payer: Self-pay | Admitting: Family Medicine

## 2015-02-09 ENCOUNTER — Ambulatory Visit (INDEPENDENT_AMBULATORY_CARE_PROVIDER_SITE_OTHER): Payer: Medicare HMO | Admitting: Family Medicine

## 2015-02-09 ENCOUNTER — Encounter: Payer: Self-pay | Admitting: Family Medicine

## 2015-02-09 VITALS — BP 134/68 | HR 70 | Temp 98.5°F | Wt 159.2 lb

## 2015-02-09 DIAGNOSIS — R42 Dizziness and giddiness: Secondary | ICD-10-CM

## 2015-02-09 DIAGNOSIS — E7800 Pure hypercholesterolemia, unspecified: Secondary | ICD-10-CM

## 2015-02-09 NOTE — Patient Instructions (Addendum)
Skip the furosemide and potassium for one day and see how you feel.   Update me at that point.   I want to know about the "funny feeling" in your head and your urinary symptoms.  Avoid fatty foods, fried foods, red meat.   We'll work on the cholesterol medicine in the meantime.   Take care.  Glad to see you.

## 2015-02-09 NOTE — Progress Notes (Signed)
ER f/u.  He can roll over the in bed and then feels "funny in the head" w/o syncope or presyncope.  He has similar sx with sitting to standing but not with moving from standing to sitting.  No ear ringing. He doesn't have true vertigo sx.  He has urinary frequency, on lasix, unclear if this is related (ie with transient low BP).  We are working on praluent PA for patient for praluent.    Meds, vitals, and allergies reviewed.   ROS: See HPI.  Otherwise, noncontributory.  nad ncat TM wnl B MMM Neck supple, no LA No sx with standing, eye tracking, or head turning.  rrr ctab abd soft Ext w/o edema

## 2015-02-10 ENCOUNTER — Telehealth: Payer: Self-pay

## 2015-02-10 DIAGNOSIS — R42 Dizziness and giddiness: Secondary | ICD-10-CM | POA: Insufficient documentation

## 2015-02-10 NOTE — Telephone Encounter (Signed)
He skipped his lasix and potassium as instructed.   Skip them again tomorrow if his AM weight is less than 162 lbs.   From now on, as long as his weight is <162 lbs in the AM then hold the medicines.   If AM weight >162 lbs, then take a dose of lasix and K that day.  Update me in a few days.   Take care.

## 2015-02-10 NOTE — Telephone Encounter (Signed)
Mrs Cancilla left v/m that pt  Did not take a medicine today and feels better than he has felt in 2 weeks. Wanted Dr Para March to know. Did not leave name of med. Left v/m for Mrs Sollazzo to cb with name of med.

## 2015-02-10 NOTE — Assessment & Plan Note (Signed)
We are working on his PA, d/w pt.

## 2015-02-10 NOTE — Telephone Encounter (Signed)
Form faxed for more info.  Form given to Dr. Para March for completion.

## 2015-02-10 NOTE — Telephone Encounter (Signed)
Wife advised and repeated instructions correctly. 

## 2015-02-10 NOTE — Assessment & Plan Note (Signed)
W/o true vertigo.  Will hold lasix and K one day to see if that helps, also to see if urinary sx improve transiently.  He agrees.  >25 minutes spent in face to face time with patient, >50% spent in counselling or coordination of care.

## 2015-02-10 NOTE — Telephone Encounter (Signed)
Raynelle Fanning with [facility] prior auth dept left v/m needing more info for praluent PA; needs to verify diagnosis and that pt will be taking this medication in combination with a statin. Please advise.

## 2015-02-10 NOTE — Telephone Encounter (Signed)
PA submitted thru [facility], awaiting response. 

## 2015-02-11 NOTE — Telephone Encounter (Signed)
Form done. Thanks. 

## 2015-02-11 NOTE — Telephone Encounter (Signed)
Completed form faxed back as instructed. 

## 2015-02-12 NOTE — Telephone Encounter (Signed)
PA approved.  Letter in your In Box.

## 2015-02-13 NOTE — Addendum Note (Signed)
Addended by: Joaquim Nam on: 02/13/2015 06:06 AM   Modules accepted: Orders

## 2015-02-13 NOTE — Telephone Encounter (Signed)
Noted, thanks.  Notify pt/pharmacy, please set up RN visit for dosing here every 14 days once he has the rx filled.  He'll need to bring it to the clinic.  Will need fasting labs done on the day of the 4th dose, either before or after the injection.   Lab orders are in.  Thanks.

## 2015-02-13 NOTE — Telephone Encounter (Signed)
Patient's wife notified and appt scheduled for RN visit on Tuesday, June 28th.  Will need to schedule additional appts every 14 days until the 4th dose.  (Fasting labs done on the day of the 4th dose, either before or after injection.)

## 2015-02-17 ENCOUNTER — Telehealth: Payer: Self-pay | Admitting: Family Medicine

## 2015-02-17 ENCOUNTER — Telehealth: Payer: Self-pay | Admitting: *Deleted

## 2015-02-17 ENCOUNTER — Ambulatory Visit: Payer: Medicare HMO

## 2015-02-17 ENCOUNTER — Telehealth: Payer: Self-pay | Admitting: Internal Medicine

## 2015-02-17 NOTE — Telephone Encounter (Signed)
If not continuous or large volume bleed, then hold aspirin for now, continue plavix, check tomorrow in clinic.  If lightheaded/SOB/large volume bleed, to ER. Thanks.

## 2015-02-17 NOTE — Telephone Encounter (Signed)
-----   Message from Joaquim NamGraham S Duncan, MD sent at 02/17/2015  4:15 PM EDT ----- Needs eval tonight.  I thought this was only mild bleeding (ie hemorrhoid).  UC vs ER.  Thanks.    Clelia CroftShaw  ----- Message -----    From: Annamarie MajorLugene S Shirlette Scarber, CMA    Sent: 02/17/2015   3:07 PM      To: Joaquim NamGraham S Duncan, MD  I spoke with wife and advised her of your recommendations.  We scheduled an appt with you tomorrow at 2:15 pm.  However, wife says patient is complaining with the bottom of his stomach hurting similar to how it was when he had diverticulitis.  In conjunction with the bleeding that was reported, I wondered if it is okay for him to wait until tomorrow to be seen.  Please advise.

## 2015-02-17 NOTE — Telephone Encounter (Signed)
Rec'd from [facility] forward 4 pages to Dr.Norins ° °

## 2015-02-17 NOTE — Telephone Encounter (Signed)
Wife advised.  Patient scheduled appt with Dr. Para Marchuncan at 2:15 pm tomorrow (June 29th).

## 2015-02-17 NOTE — Telephone Encounter (Signed)
Patient Name: Ander PurpuraJOHN Canter DOB: [birth year] Initial Comment Caller states husband had a TIA about 3 weeks ago, dr put him on Plavix, had some blood in stool Nurse Assessment Nurse: Elijah Birkaldwell, RN, [name] Date/Time (Eastern Time): 02/17/2015 10:43:09 AM Confirm and document reason for call. If symptomatic, describe symptoms. ---Caller states husband had a TIA about 3 weeks ago, Dr. put him on Plavix. Has had some blood in stool the last few times. Is also on ASA. States the amt. is small. Has had abdominal pain since Sunday off & on. Has the patient traveled out of the country within the last 30 days? ---Not Applicable Does the patient require triage? ---Yes Related visit to physician within the last 2 weeks? ---Yes Does the PT have any chronic conditions? (i.e. diabetes, asthma, etc.) ---Yes List chronic conditions. ---cholesterol, on BP rx, TIA, acid reflux Guidelines Guideline Title Affirmed Question Affirmed Notes Rectal Bleeding Rectal bleeding (Exceptions: blood just on toilet paper, few drops, streaks on surface of normal formed BM) Final Disposition User See Physician within 24 Hours [place], RN, [name] Comments Caller states her husband is coming in today for appt. at 4 pm with nurse for shots. Would prefer to see Dr. Para Marchuncan, but no appts. available on schedule today/tomorrow. Please contact patient with further feedback/advice re: blood in stool. Caller states her husband noticed the blood when he wiped after having BM.

## 2015-02-17 NOTE — Telephone Encounter (Signed)
Wife advised.  Wife says he hasn't complained about the pain in the last little bit but if he does, she will take him to UC.  Appt for Dr. Para Marchuncan tomorrow is left in place for now.

## 2015-02-18 ENCOUNTER — Encounter: Payer: Self-pay | Admitting: Family Medicine

## 2015-02-18 ENCOUNTER — Ambulatory Visit (INDEPENDENT_AMBULATORY_CARE_PROVIDER_SITE_OTHER): Payer: Medicare HMO | Admitting: Family Medicine

## 2015-02-18 VITALS — BP 144/68 | HR 62 | Temp 98.3°F | Wt 160.5 lb

## 2015-02-18 DIAGNOSIS — R1030 Lower abdominal pain, unspecified: Secondary | ICD-10-CM | POA: Diagnosis not present

## 2015-02-18 DIAGNOSIS — K5792 Diverticulitis of intestine, part unspecified, without perforation or abscess without bleeding: Secondary | ICD-10-CM

## 2015-02-18 LAB — CBC WITH DIFFERENTIAL/PLATELET
Basophils Absolute: 0 10*3/uL (ref 0.0–0.1)
Basophils Relative: 0.6 % (ref 0.0–3.0)
Eosinophils Absolute: 0.3 10*3/uL (ref 0.0–0.7)
Eosinophils Relative: 4.6 % (ref 0.0–5.0)
HCT: 39.2 % (ref 39.0–52.0)
Hemoglobin: 12.6 g/dL — ABNORMAL LOW (ref 13.0–17.0)
Lymphocytes Relative: 31.7 % (ref 12.0–46.0)
Lymphs Abs: 2 10*3/uL (ref 0.7–4.0)
MCHC: 32.2 g/dL (ref 30.0–36.0)
MCV: 81.5 fl (ref 78.0–100.0)
Monocytes Absolute: 0.7 10*3/uL (ref 0.1–1.0)
Monocytes Relative: 10.8 % (ref 3.0–12.0)
Neutro Abs: 3.3 10*3/uL (ref 1.4–7.7)
Neutrophils Relative %: 52.3 % (ref 43.0–77.0)
Platelets: 293 10*3/uL (ref 150.0–400.0)
RBC: 4.81 Mil/uL (ref 4.22–5.81)
RDW: 15.3 % (ref 11.5–15.5)
WBC: 6.3 10*3/uL (ref 4.0–10.5)

## 2015-02-18 LAB — BASIC METABOLIC PANEL
BUN: 15 mg/dL (ref 6–23)
CO2: 26 mEq/L (ref 19–32)
Calcium: 9.3 mg/dL (ref 8.4–10.5)
Chloride: 105 mEq/L (ref 96–112)
Creatinine, Ser: 1.18 mg/dL (ref 0.40–1.50)
GFR: 62.02 mL/min (ref >60.00)
Glucose, Bld: 108 mg/dL — ABNORMAL HIGH (ref 70–99)
Potassium: 3.9 mEq/L (ref 3.5–5.1)
Sodium: 139 mEq/L (ref 135–145)

## 2015-02-18 MED ORDER — CIPROFLOXACIN HCL 500 MG PO TABS: 500.0000 mg | ORAL_TABLET | Freq: Two times a day (BID) | ORAL | Status: DC

## 2015-02-18 MED ORDER — METRONIDAZOLE 500 MG PO TABS: 500.0000 mg | ORAL_TABLET | Freq: Three times a day (TID) | ORAL | Status: AC

## 2015-02-18 NOTE — Progress Notes (Signed)
Pre visit review using our clinic review tool, if applicable. No additional management support is needed unless otherwise documented below in the visit note.  He had some abd pain.  That was the first symptom, started 02/15/15.  H/o diverticulitis.  This feel similar.  Pain is intermittent.  Lower abd pain when it happens.  No upper abd pain.   Small amount of blood when wiping.  No fevers.  No vomiting.  No one else is sick.  He isn't worse over the last few days but not getting better.    D/w pt about getting RN visit for coaching on praluent when he feels better.   Meds, vitals, and allergies reviewed.   ROS: See HPI.  Otherwise, noncontributory.  nad ncat Mmm rrr ctab abd soft, ttp in the lower abd (midline), not focally ttp in the RLQ or LLQ, no rebound.  Normal BS

## 2015-02-18 NOTE — Patient Instructions (Signed)
Start both of the antibiotics today.  Go to the lab on the way out.  We'll contact you with your lab report. Update me in about 1-2 days.  If worse, go to the ER.  Restart the aspirin on Friday if no more bleeding.   Bland diet- low fiber for now.  Try to limit your diet to clear liquids for the next 24 hours.   Schedule a nurse visit to get coaching on praluent injection when you feel better- ask to get on the schedule with Gina.

## 2015-02-18 NOTE — Assessment & Plan Note (Signed)
Presumed, start cipro/flagyl, hold off on praluent today and return for RN visit.  Clear liquids, see AVS.  Check basic labs on the way out.  I asked patient to update me.  If worsening, to ER.  He agrees.

## 2015-02-27 ENCOUNTER — Ambulatory Visit (INDEPENDENT_AMBULATORY_CARE_PROVIDER_SITE_OTHER): Payer: Medicare HMO | Admitting: *Deleted

## 2015-02-27 ENCOUNTER — Other Ambulatory Visit: Payer: Self-pay | Admitting: *Deleted

## 2015-02-27 DIAGNOSIS — E785 Hyperlipidemia, unspecified: Secondary | ICD-10-CM

## 2015-02-27 MED ORDER — ALIROCUMAB 75 MG/ML ~~LOC~~ SOPN
75.0000 mg | PEN_INJECTOR | SUBCUTANEOUS | Status: AC
  Administered 2015-02-27: 75 mg via SUBCUTANEOUS

## 2015-02-27 MED ORDER — CLOPIDOGREL BISULFATE 75 MG PO TABS: 75.0000 mg | ORAL_TABLET | Freq: Every day | ORAL | Status: DC

## 2015-02-27 MED ORDER — DILTIAZEM HCL ER 180 MG PO CP24: 180.0000 mg | ORAL_CAPSULE | Freq: Every day | ORAL | Status: DC

## 2015-02-27 NOTE — Progress Notes (Signed)
Patient here for teaching of self administration of Praluent injection.

## 2015-03-12 ENCOUNTER — Other Ambulatory Visit: Payer: Self-pay | Admitting: Family Medicine

## 2015-03-22 ENCOUNTER — Telehealth: Payer: Self-pay | Admitting: Family Medicine

## 2015-03-22 NOTE — Telephone Encounter (Signed)
Call pt.  Did he tolerated second praluent injection?  If so, I need to know to set up the follow up lipids/labs in ~04/2015.  Thanks.

## 2015-03-23 NOTE — Telephone Encounter (Signed)
Thanks

## 2015-03-23 NOTE — Telephone Encounter (Signed)
Patient did tolerate 2nd injection and follow up lipid panel is scheduled for August 31st.  That will allow the patient to have had 4 injections total (2 months) and we should be able to get the results of these labs in time for them to order another injection if needed and stay on the given regimen.

## 2015-03-25 ENCOUNTER — Other Ambulatory Visit: Payer: Self-pay | Admitting: Family Medicine

## 2015-03-25 NOTE — Telephone Encounter (Signed)
Received refill request electronically from pharmacy.  Last refill 03/03/14 #180/3 Last office visit 02/18/15 Is it okay to refill?

## 2015-03-25 NOTE — Telephone Encounter (Signed)
Sent. Thanks.   

## 2015-03-29 ENCOUNTER — Inpatient Hospital Stay (HOSPITAL_COMMUNITY): Payer: Medicare HMO

## 2015-03-29 ENCOUNTER — Emergency Department (HOSPITAL_COMMUNITY): Payer: Medicare HMO

## 2015-03-29 ENCOUNTER — Encounter (HOSPITAL_COMMUNITY): Payer: Self-pay | Admitting: Emergency Medicine

## 2015-03-29 ENCOUNTER — Inpatient Hospital Stay (HOSPITAL_COMMUNITY)
Admission: EM | Admit: 2015-03-29 | Discharge: 2015-03-30 | DRG: 066 | Disposition: A | Discharge status: Home / Self Care | Payer: Medicare HMO | Attending: Internal Medicine | Admitting: Internal Medicine

## 2015-03-29 DIAGNOSIS — Z7902 Long term (current) use of antithrombotics/antiplatelets: Secondary | ICD-10-CM | POA: Diagnosis not present

## 2015-03-29 DIAGNOSIS — I6502 Occlusion and stenosis of left vertebral artery: Secondary | ICD-10-CM | POA: Diagnosis present

## 2015-03-29 DIAGNOSIS — K2270 Barrett's esophagus without dysplasia: Secondary | ICD-10-CM | POA: Diagnosis not present

## 2015-03-29 DIAGNOSIS — Z7982 Long term (current) use of aspirin: Secondary | ICD-10-CM | POA: Diagnosis not present

## 2015-03-29 DIAGNOSIS — D6800 Von Willebrand disease, unspecified: Secondary | ICD-10-CM | POA: Diagnosis present

## 2015-03-29 DIAGNOSIS — K219 Gastro-esophageal reflux disease without esophagitis: Secondary | ICD-10-CM | POA: Diagnosis present

## 2015-03-29 DIAGNOSIS — Z87891 Personal history of nicotine dependence: Secondary | ICD-10-CM

## 2015-03-29 DIAGNOSIS — E7800 Pure hypercholesterolemia, unspecified: Secondary | ICD-10-CM | POA: Diagnosis present

## 2015-03-29 DIAGNOSIS — Z888 Allergy status to other drugs, medicaments and biological substances status: Secondary | ICD-10-CM

## 2015-03-29 DIAGNOSIS — I6350 Cerebral infarction due to unspecified occlusion or stenosis of unspecified cerebral artery: Secondary | ICD-10-CM

## 2015-03-29 DIAGNOSIS — E785 Hyperlipidemia, unspecified: Secondary | ICD-10-CM | POA: Diagnosis present

## 2015-03-29 DIAGNOSIS — I6623 Occlusion and stenosis of bilateral posterior cerebral arteries: Secondary | ICD-10-CM | POA: Diagnosis present

## 2015-03-29 DIAGNOSIS — I63311 Cerebral infarction due to thrombosis of right middle cerebral artery: Secondary | ICD-10-CM | POA: Diagnosis not present

## 2015-03-29 DIAGNOSIS — K5520 Angiodysplasia of colon without hemorrhage: Secondary | ICD-10-CM | POA: Diagnosis not present

## 2015-03-29 DIAGNOSIS — I10 Essential (primary) hypertension: Secondary | ICD-10-CM | POA: Diagnosis present

## 2015-03-29 DIAGNOSIS — I639 Cerebral infarction, unspecified: Principal | ICD-10-CM | POA: Diagnosis present

## 2015-03-29 LAB — COMPREHENSIVE METABOLIC PANEL
ALT: 13 U/L — ABNORMAL LOW (ref 17–63)
AST: 26 U/L (ref 15–41)
Albumin: 3.9 g/dL (ref 3.5–5.0)
Alkaline Phosphatase: 79 U/L (ref 38–126)
Anion gap: 12 (ref 5–15)
BUN: 11 mg/dL (ref 6–20)
CO2: 23 mmol/L (ref 22–32)
Calcium: 9.5 mg/dL (ref 8.9–10.3)
Chloride: 103 mmol/L (ref 101–111)
Creatinine, Ser: 1.08 mg/dL (ref 0.61–1.24)
GFR calc Af Amer: >60 mL/min (ref >60)
GFR calc non Af Amer: 60 mL/min — ABNORMAL LOW (ref >60)
Glucose, Bld: 98 mg/dL (ref 65–99)
Potassium: 4 mmol/L (ref 3.5–5.1)
Sodium: 138 mmol/L (ref 135–145)
Total Bilirubin: 0.6 mg/dL (ref 0.3–1.2)
Total Protein: 7.1 g/dL (ref 6.5–8.1)

## 2015-03-29 LAB — CBC WITH DIFFERENTIAL/PLATELET
Basophils Absolute: 0 10*3/uL (ref 0.0–0.1)
Basophils Relative: 1 % (ref 0–1)
Eosinophils Absolute: 0.2 10*3/uL (ref 0.0–0.7)
Eosinophils Relative: 3 % (ref 0–5)
HCT: 42.1 % (ref 39.0–52.0)
Hemoglobin: 13.4 g/dL (ref 13.0–17.0)
Lymphocytes Relative: 38 % (ref 12–46)
Lymphs Abs: 2.6 10*3/uL (ref 0.7–4.0)
MCH: 26.2 pg (ref 26.0–34.0)
MCHC: 31.8 g/dL (ref 30.0–36.0)
MCV: 82.2 fL (ref 78.0–100.0)
Monocytes Absolute: 0.6 10*3/uL (ref 0.1–1.0)
Monocytes Relative: 9 % (ref 3–12)
Neutro Abs: 3.4 10*3/uL (ref 1.7–7.7)
Neutrophils Relative %: 49 % (ref 43–77)
Platelets: 299 10*3/uL (ref 150–400)
RBC: 5.12 MIL/uL (ref 4.22–5.81)
RDW: 13.7 % (ref 11.5–15.5)
WBC: 6.8 10*3/uL (ref 4.0–10.5)

## 2015-03-29 LAB — URINALYSIS, ROUTINE W REFLEX MICROSCOPIC
Bilirubin Urine: NEGATIVE
Glucose, UA: NEGATIVE mg/dL
Hgb urine dipstick: NEGATIVE
Ketones, ur: NEGATIVE mg/dL
Leukocytes, UA: NEGATIVE
Nitrite: NEGATIVE
Protein, ur: NEGATIVE mg/dL
Specific Gravity, Urine: 1.007 (ref 1.005–1.030)
Urobilinogen, UA: 0.2 mg/dL (ref 0.0–1.0)
pH: 6 (ref 5.0–8.0)

## 2015-03-29 LAB — TROPONIN I: Troponin I: <0.03 ng/mL (ref <0.031)

## 2015-03-29 MED ORDER — SODIUM CHLORIDE 0.9 % IV BOLUS (SEPSIS)
1000.0000 mL | Freq: Once | INTRAVENOUS | Status: AC
  Administered 2015-03-29: 1000 mL via INTRAVENOUS

## 2015-03-29 MED ORDER — PANTOPRAZOLE SODIUM 40 MG PO TBEC
40.0000 mg | DELAYED_RELEASE_TABLET | Freq: Two times a day (BID) | ORAL | Status: DC
  Administered 2015-03-29 – 2015-03-30 (×2): 40 mg via ORAL
  Filled 2015-03-29 (×2): qty 1

## 2015-03-29 MED ORDER — GEMFIBROZIL 600 MG PO TABS
600.0000 mg | ORAL_TABLET | Freq: Two times a day (BID) | ORAL | Status: DC
  Administered 2015-03-30 (×2): 600 mg via ORAL
  Filled 2015-03-29 (×3): qty 1

## 2015-03-29 MED ORDER — DIAZEPAM 2 MG PO TABS
2.0000 mg | ORAL_TABLET | Freq: Once | ORAL | Status: AC
  Administered 2015-03-29: 2 mg via ORAL
  Filled 2015-03-29: qty 1

## 2015-03-29 MED ORDER — ASPIRIN EC 81 MG PO TBEC
81.0000 mg | DELAYED_RELEASE_TABLET | Freq: Every evening | ORAL | Status: DC
  Administered 2015-03-29: 81 mg via ORAL
  Filled 2015-03-29: qty 1

## 2015-03-29 MED ORDER — GABAPENTIN 300 MG PO CAPS
300.0000 mg | ORAL_CAPSULE | Freq: Two times a day (BID) | ORAL | Status: DC
  Administered 2015-03-29 – 2015-03-30 (×2): 300 mg via ORAL
  Filled 2015-03-29 (×2): qty 1

## 2015-03-29 MED ORDER — ONDANSETRON HCL 4 MG/2ML IJ SOLN
4.0000 mg | Freq: Once | INTRAMUSCULAR | Status: AC
  Administered 2015-03-29: 4 mg via INTRAVENOUS
  Filled 2015-03-29: qty 2

## 2015-03-29 MED ORDER — OCUVITE-LUTEIN PO CAPS
ORAL_CAPSULE | Freq: Every day | ORAL | Status: DC
  Administered 2015-03-30: 1 via ORAL
  Filled 2015-03-29 (×2): qty 1

## 2015-03-29 MED ORDER — LORATADINE 10 MG PO TABS
10.0000 mg | ORAL_TABLET | Freq: Every day | ORAL | Status: DC
  Administered 2015-03-29 – 2015-03-30 (×2): 10 mg via ORAL
  Filled 2015-03-29 (×2): qty 1

## 2015-03-29 MED ORDER — CLOPIDOGREL BISULFATE 75 MG PO TABS
75.0000 mg | ORAL_TABLET | Freq: Every day | ORAL | Status: DC
  Administered 2015-03-29 – 2015-03-30 (×2): 75 mg via ORAL
  Filled 2015-03-29 (×2): qty 1

## 2015-03-29 MED ORDER — MECLIZINE HCL 25 MG PO TABS
25.0000 mg | ORAL_TABLET | Freq: Once | ORAL | Status: AC
  Administered 2015-03-29: 25 mg via ORAL
  Filled 2015-03-29: qty 1

## 2015-03-29 MED ORDER — FAMOTIDINE 20 MG PO TABS
20.0000 mg | ORAL_TABLET | Freq: Two times a day (BID) | ORAL | Status: DC
  Administered 2015-03-29 – 2015-03-30 (×2): 20 mg via ORAL
  Filled 2015-03-29 (×2): qty 1

## 2015-03-29 MED ORDER — ACETAMINOPHEN 650 MG RE SUPP: 650.0000 mg | RECTAL | Status: DC | PRN

## 2015-03-29 MED ORDER — SUCRALFATE 1 G PO TABS
1.0000 g | ORAL_TABLET | Freq: Every day | ORAL | Status: DC
  Administered 2015-03-29: 1 g via ORAL
  Filled 2015-03-29: qty 1

## 2015-03-29 MED ORDER — STROKE: EARLY STAGES OF RECOVERY BOOK
Freq: Once | Status: DC
  Filled 2015-03-29: qty 1

## 2015-03-29 MED ORDER — ARTIFICIAL TEARS OP OINT
TOPICAL_OINTMENT | OPHTHALMIC | Status: DC | PRN
  Filled 2015-03-29: qty 3.5

## 2015-03-29 MED ORDER — ONDANSETRON HCL 4 MG/2ML IJ SOLN
4.0000 mg | Freq: Four times a day (QID) | INTRAMUSCULAR | Status: DC | PRN
Start: 2015-03-29 — End: 2015-03-30

## 2015-03-29 MED ORDER — PANTOPRAZOLE SODIUM 40 MG PO TBEC: 40.0000 mg | DELAYED_RELEASE_TABLET | Freq: Every morning | ORAL | Status: DC

## 2015-03-29 MED ORDER — SENNOSIDES-DOCUSATE SODIUM 8.6-50 MG PO TABS: 1.0000 | ORAL_TABLET | Freq: Every evening | ORAL | Status: DC | PRN

## 2015-03-29 MED ORDER — ENOXAPARIN SODIUM 40 MG/0.4ML ~~LOC~~ SOLN
40.0000 mg | SUBCUTANEOUS | Status: DC
  Administered 2015-03-29: 40 mg via SUBCUTANEOUS
  Filled 2015-03-29: qty 0.4

## 2015-03-29 MED ORDER — ACETAMINOPHEN 500 MG PO TABS: 500.0000 mg | ORAL_TABLET | Freq: Four times a day (QID) | ORAL | Status: DC | PRN

## 2015-03-29 MED ORDER — HYDRALAZINE HCL 20 MG/ML IJ SOLN: 10.0000 mg | Freq: Four times a day (QID) | INTRAMUSCULAR | Status: DC | PRN

## 2015-03-29 MED ORDER — SODIUM CHLORIDE 0.9 % IV BOLUS (SEPSIS)
500.0000 mL | Freq: Once | INTRAVENOUS | Status: AC
  Administered 2015-03-29: 500 mL via INTRAVENOUS

## 2015-03-29 NOTE — H&P (Signed)
History and Physical  [name] WJX:[id number] DOB: Jan 05, 1926 DOA: 03/29/2015   PCP: [name], MD  Referring Physician: ED/ Dr. Patria Mane  Chief Complaint: Gait instability  HPI:  It is 81-year-old male with a history of Barrett's esophagus, GERD, diverticulitis, hypertension, hyperlipidemia, GI AVMs, macular degeneration, and stroke in June 2016 presented with dizziness and gait instability. The patient was feeling fine on 03/28/2015. The patient mowed his lawn with a riding mower on the day prior to admission. On the evening of 03/28/2015 he began having some dizziness. When he woke up on the morning of 03/29/2015, the patient had difficulty walking with gait instability. He denies any falling or syncope. The patient denies any recent fevers, chills, chest pain, shortness breath, nausea, vomiting, diarrhea. There is no dysarthria or word finding difficulties. He denies any focal extremity weakness or visual loss. The patient endorses 100% compliance with all his medications including Plavix and aspirin. The patient was recently discharged from the hospital on 01/31/2015 after suffering an acute ischemic stroke in the right precentral gyrus. At that time, the patient was started on Plavix and continued on aspirin 81 mg daily at the time of discharge.  In the emergency department, the patient was afebrile hemodynamically stable. He was hypertensive with blood pressure up to 195/64 with oxygen saturation 96-100%. BMP, LFTs, CBC are unremarkable. EKG shows sinus rhythm with nonspecific ST changes CT of the brain was negative, but MRI in the emergency department revealed a new nonhemorrhagic infarct in the right frontal cortex .  Assessment/Plan: Acute nonhemorrhagic stroke  -03/29/2015 MRI brain acute nonhemorrhagic infarct right frontal cortex  -MRA brain -01/29/2015 CT angiogram of the head and neck reveals left vertebral artery occlusion  -We'll not repeat carotid duplex  -01/30/2015  echocardiogram shows EF 55%, no intracardiac thrombi--will not repeat echocardiogram unless neurology feels compelled  -Continue aspirin 81 mg daily and Plavix 75 mg daily  -PT/OT/ST  -01/29/2015 hemoglobin A1c 6.1----will not repeat at this time  -01/29/2015 LDL 176--patient takes Praluent every 14 days  -We will recheck lipids in the morning  -Neurology has been consulted to see the patient  Hypertension  -Allow for permissive hypertension the first 24 hours  -Hold metoprolol succinate and diltiazem  -Hydralazine IV prn SBP >210 -The patient no longer takes furosemide or potassium supplementation Impaired glucose tolerance   -01/29/2015 hemoglobin A1c 6.1  GERD with history of Barrett's esophagus  -Continue PPI, H2 blocker, and sulcrafate Dizziness  -Likely multifactorial including the patient's acute stroke and possible left mastoid/left middle ear fluid noted on MRI  -Check orthostatic vital signs  -UA and urine culture  Intestinal AVMs -Hgb is stable at baseline (~12-13)       Past Medical History  Diagnosis Date  . Barrett esophagus 09/2003  . GERD (gastroesophageal reflux disease)   . Hemorrhoids   . Diverticulosis   . Hyperlipidemia   . Hypertension   . Von Willebrand disease   . PUD (peptic ulcer disease)   . Hearing loss   . GI AVM (gastrointestinal arteriovenous vascular malformation)   . History of prostatitis   . Hemorrhoids   . Macular degeneration, wet     receiving intra-ocular injections ([place])  . Back pain     improved with gabapentin as of 2015  . Stroke    Past Surgical History  Procedure Laterality Date  . Inguinal hernia repair  2001  . Laparoscopic appendectomy N/A 07/07/2014    Procedure: APPENDECTOMY LAPAROSCOPIC;  Surgeon:  Manus Rudd, MD;  Location: [facility] OR;  Service: General;  Laterality: N/A;   Social History:  reports that he has quit smoking. His smoking use included Cigarettes. He has a 36 pack-year smoking history. He has never  used smokeless tobacco. He reports that he does not drink alcohol or use illicit drugs.   Family History  Problem Relation Age of Onset  . Heart attack Father   . Coronary artery disease Father   . Hypertension Mother   . Pancreatic cancer Mother     Died at 28.  . Cancer Mother     Pancreatic cancer  . Arthritis Sister   . Hyperlipidemia Sister   . Prostate cancer Brother   . Cancer Brother     Bone Cancer secondary to Prostate Cancer  . Diabetes Neg Hx      Allergies  Allergen Reactions  . Atorvastatin     REACTION: muscles tense up   . Codeine Nausea And Vomiting  . Lisinopril Swelling    Lips and face swelling  . Statins Other (See Comments)    Muscle tense up      Prior to Admission medications   Medication Sig Start Date End Date Taking? Authorizing Provider  acetaminophen (TYLENOL) 500 MG tablet Take 500 mg by mouth every 6 (six) hours as needed for mild pain, moderate pain or headache.   Yes Historical Provider, MD  Alirocumab (PRALUENT) 75 MG/ML SOPN Inject 75 mg into the skin every 14 (fourteen) days. 02/06/15  Yes Joaquim Nam, MD  aspirin EC 81 MG tablet Take 81 mg by mouth every evening.   Yes Historical Provider, MD  Bevacizumab (AVASTIN IV) Place into the right eye See admin instructions. Place into the right eye every 9 weeks. Last dose was on 03-17-15   Yes Historical Provider, MD  cetirizine (ZYRTEC) 10 MG tablet Take 10 mg by mouth daily as needed for allergies.    Yes Historical Provider, MD  clopidogrel (PLAVIX) 75 MG tablet Take 1 tablet (75 mg total) by mouth daily. 02/27/15  Yes Joaquim Nam, MD  diltiazem (DILACOR XR) 180 MG 24 hr capsule Take 1 capsule (180 mg total) by mouth daily. 02/27/15  Yes Joaquim Nam, MD  fluticasone (FLONASE) 50 MCG/ACT nasal spray Place 2 sprays into both nostrils daily as needed for allergies. 10/07/14  Yes Joaquim Nam, MD  gabapentin (NEURONTIN) 300 MG capsule TAKE ONE CAPSULE BY MOUTH TWICE A DAY 03/25/15  Yes  Joaquim Nam, MD  gemfibrozil (LOPID) 600 MG tablet TAKE 1 TABLET BY MOUTH TWICE A DAY 02/09/15  Yes Joaquim Nam, MD  Hypromellose (ARTIFICIAL TEARS OP) Apply 1 drop to eye daily as needed (itchy / watery eyes).   Yes Historical Provider, MD  loperamide (IMODIUM) 2 MG capsule Take 1 capsule (2 mg total) by mouth 3 (three) times daily as needed for diarrhea or loose stools. 07/15/14  Yes Elease Etienne, MD  metoprolol succinate (TOPROL-XL) 25 MG 24 hr tablet TAKE ONE TABLET BY MOUTH EVERY NIGHT AT BEDTIME 02/09/15  Yes Joaquim Nam, MD  Multiple Vitamins-Minerals (OCUVITE ADULT 50+ PO) Take 1 capsule by mouth daily.     Yes Historical Provider, MD  pantoprazole (PROTONIX) 40 MG tablet Take 1 tablet (40 mg total) by mouth 2 (two) times daily before a meal. 07/15/14 07/15/15 Yes Elease Etienne, MD  pantoprazole (PROTONIX) 40 MG tablet TAKE ONE TABLET BY MOUTH EVERY MORNING 03/12/15  Yes Joaquim Nam, MD  ranitidine (ZANTAC) 150 MG tablet TAKE 1 TABLET BY MOUTH TWICE A DAY 12/24/14  Yes Joaquim Nam, MD  sucralfate (CARAFATE) 1 G tablet TAKE 1 TABLET BY MOUTH BEFORE LAYING DOWN FOR NAP OR BEDTIME 11/24/14  Yes Joaquim Nam, MD  acetaminophen (TYLENOL) 325 MG tablet Take 2 tablets (650 mg total) by mouth every 6 (six) hours as needed for mild pain, moderate pain, fever or headache (or Fever >/= 101). 07/15/14   Elease Etienne, MD  furosemide (LASIX) 40 MG tablet TAKE 1 TABLET BY MOUTH DAILY Patient not taking: Reported on 02/18/2015 11/20/14   Joaquim Nam, MD  potassium chloride SA (K-DUR,KLOR-CON) 20 MEQ tablet TAKE 1 TABLET BY MOUTH DAILY Patient not taking: Reported on 02/18/2015 07/03/14   Joaquim Nam, MD    Review of Systems:  Constitutional:  No weight loss, night sweats, Fevers, chills, fatigue.  Head&Eyes: No headache.  No vision loss.  No eye pain or scotoma ENT:  No Difficulty swallowing,Tooth/dental problems,Sore throat,  No ear ache, post nasal drip,    Cardio-vascular:  No chest pain, Orthopnea, PND, swelling in lower extremities,  dizziness, palpitations  GI:  No  abdominal pain, nausea, vomiting, diarrhea, loss of appetite, hematochezia, melena, heartburn, indigestion, Resp:  No shortness of breath with exertion or at rest. No cough. No coughing up of blood .No wheezing.No chest wall deformity  Skin:  no rash or lesions.  GU:  no dysuria, change in color of urine, no urgency or frequency. No flank pain.  Musculoskeletal:  No joint pain or swelling. No decreased range of motion. No back pain.  Psych:  No change in mood or affect. No depression or anxiety. Neurologic: No headache, no dysesthesia, no focal weakness, no vision loss. No syncope  Physical Exam: Filed Vitals:   03/29/15 1500 03/29/15 1508 03/29/15 1515 03/29/15 1545  BP: 195/64 195/64 180/80 200/82  Pulse: 73 81 70 70  Temp:      TempSrc:      Resp: 25 18 22 18   SpO2: 96% 96% 96% 98%   General:  A&O x 3, NAD, nontoxic, pleasant/cooperative Head/Eye: No conjunctival hemorrhage, no icterus, [place]/AT, No nystagmus ENT:  No icterus,  No thrush,  no pharyngeal exudate Neck:  No masses, no lymphadenpathy, no bruits CV:  RRR, no rub, no gallop, no S3 Lung:  Bibasilar crackles without any wheezing  Abdomen: soft/NT, +BS, nondistended, no peritoneal signs Ext: No cyanosis, No rashes, No petechiae, No lymphangitis, No edema Neuro: CNII-XII intact, strength 4/5 in bilateral upper and lower extremities, no dysmetria  Labs on Admission:  Basic Metabolic Panel:  Recent Labs Lab 03/29/15 0909  NA 138  K 4.0  CL 103  CO2 23  GLUCOSE 98  BUN 11  CREATININE 1.08  CALCIUM 9.5   Liver Function Tests:  Recent Labs Lab 03/29/15 0909  AST 26  ALT 13*  ALKPHOS 79  BILITOT 0.6  PROT 7.1  ALBUMIN 3.9   No results for input(s): LIPASE, AMYLASE in the last 168 hours. No results for input(s): AMMONIA in the last 168 hours. CBC:  Recent Labs Lab 03/29/15 0909   WBC 6.8  NEUTROABS 3.4  HGB 13.4  HCT 42.1  MCV 82.2  PLT 299   Cardiac Enzymes:  Recent Labs Lab 03/29/15 0909  TROPONINI <0.03   BNP: Invalid input(s): POCBNP CBG: No results for input(s): GLUCAP in the last 168 hours.  Radiological Exams on Admission: Ct Head Wo Contrast  03/29/2015  CLINICAL DATA:  Dizziness, lightheadedness, nausea, and dull headache since yesterdayHx of stroke and hearing loss  EXAM: CT HEAD WITHOUT CONTRAST  TECHNIQUE: Contiguous axial images were obtained from the base of the skull through the vertex without intravenous contrast.  COMPARISON:  02/06/2015  FINDINGS: The ventricles are normal in size, for this patient's age, and normal in configuration.  There are no parenchymal masses or mass effect. There is no evidence of a cortical infarct. Patchy periventricular white matter hypoattenuation is noted consistent with mild to moderate chronic microvascular ischemic change. There are 2 small old lacune infarcts in the subcortical white matter of the right frontal lobe.  There are no extra-axial masses or abnormal fluid collections.  There is no intracranial hemorrhage.  Left mastoid air cells are opacified. There is partial opacification of the head T10 [place] on the left. Clear right mastoid air cells. Visualized sinuses are clear.  IMPRESSION: 1. No acute intracranial abnormalities. 2. Age related volume loss, chronic microvascular ischemic change and 2 small white matter lacune infarcts. Opacification of left mastoid air cells and a portion of the left middle ear cavity, stable the prior CT.   Electronically Signed   By: Amie Portland M.D.   On: 03/29/2015 09:52   Mr Brain Wo Contrast  03/29/2015   CLINICAL DATA:  New onset of dizziness, nausea, and dull headache since yesterday. Patient too weak to get out of the car at ED.  EXAM: MRI HEAD WITHOUT CONTRAST  TECHNIQUE: Multiplanar, multiecho pulse sequences of the brain and surrounding structures were obtained  without intravenous contrast.  COMPARISON:  CT head 03/29/2015. MR head 01/28/2015.  FINDINGS: There is a new area of restricted diffusion, tubular shape, involving a RIGHT frontal lobe MCA perforator, extending from the superior anterior RIGHT frontal cortex into the deep white matter as seen on image 40 series 3 and image 25 series 7, DWI. The previously noted areas of restricted diffusion, punctate foci of acute infarction in the RIGHT posterior frontal cortex and subcortical white matter have normalized on today's exam when correlated with 01/28/2015 MR.  No hemorrhage, mass lesion, hydrocephalus, or extra-axial fluid.  Generalized severe atrophy. Extensive small vessel disease with multiple remote lacunar infarcts. No visible vascular occlusion. No chronic hemorrhage. No acute findings in the orbits or paranasal sinuses. BILATERAL cataract extraction.  There is significant fluid accumulation throughout the LEFT middle ear and mastoid. This appearance was noted previously and is persistent. Correlate clinically for otomastoiditis.  IMPRESSION: New nonhemorrhagic tubular shaped area of restricted diffusion RIGHT frontal cortex and subcortical white matter, consistent with an acute perforating vessel infarct, RIGHT MCA territory.  Normalization of the previous areas of acute infarctions demonstrated in June.  Generalized severe atrophy and extensive small vessel disease.  Moderately extensive LEFT middle ear and mastoid fluid is asymmetric and persistent. Correlate clinically for otomastoiditis.   Electronically Signed   By: Elsie Stain M.D.   On: 03/29/2015 14:04   Dg Chest Portable 1 View  03/29/2015   CLINICAL DATA:  Weakness nausea headache history of hypertension and stroke  EXAM: PORTABLE CHEST - 1 VIEW  COMPARISON:  01/28/2015  FINDINGS: Not cardiac enlargement stable. Vascular pattern normal. Mild bibasilar infiltrates most consistent with atelectasis. No consolidation or effusion.  IMPRESSION: Mild  bilateral lower lobe atelectasis.   Electronically Signed   By: Esperanza Heir M.D.   On: 03/29/2015 09:24    EKG: Independently reviewed. Sinus with nonspecific ST-T changes    Time spent:60 minutes Code Status:  FULL Family Communication:   Family at bedside   Samnang Shugars, DO  [facility] Pager [phone]  If 7PM-7AM, please contact night-coverage [email or web address] Password [email or web address] 03/29/2015, 4:07 PM

## 2015-03-29 NOTE — ED Notes (Signed)
Pt from home with c/o dizziness, lightheadedness, nausea, and dull headache since yesterday.  Pt too weak to get out of vehicle on arrival to the ED.  Pt intermittently can answer questions, follows commands.  Dr Patria Mane at bedside.

## 2015-03-29 NOTE — Consult Note (Signed)
Stroke Consult    Chief Complaint: dizziness and fatigue  HPI: [name] is an 81 y.o. male history of prior infarct (01/2015), HTN, HLD presenting with dizziness, light headed sensation and a dull headache since yesterday. He reports feeling fine yesterday, was mowing the lawn. Last night felt light headed. Woke up this morning and had difficulty walking due to gait instability.  He currently denies any focal weakness, sensory, speech or visual changes.   MRI brain completed and imaging reviewed. Shows a new infarct in the right frontal cortex and subcortical white matter. CT angio from prior admission shows occluded left vertebral artery at the level of the foramen magnum with dominant right vert with no stenosis. Bilateral carotid with < 50% stenosis.   Date last known well: 03/28/2015 Time last known well: 2000 tPA Given: no, outside tPA window Modified Rankin: Rankin Score=0  Past Medical History  Diagnosis Date  . Barrett esophagus 09/2003  . GERD (gastroesophageal reflux disease)   . Hemorrhoids   . Diverticulosis   . Hyperlipidemia   . Hypertension   . Von Willebrand disease   . PUD (peptic ulcer disease)   . Hearing loss   . GI AVM (gastrointestinal arteriovenous vascular malformation)   . History of prostatitis   . Hemorrhoids   . Macular degeneration, wet     receiving intra-ocular injections ([facility]uen)  . Back pain     improved with gabapentin as of 2015  . Stroke     Past Surgical History  Procedure Laterality Date  . Inguinal hernia repair  2001  . Laparoscopic appendectomy N/A 07/07/2014    Procedure: APPENDECTOMY LAPAROSCOPIC;  Surgeon: Manus Rudd, MD;  Location: [facility] OR;  Service: General;  Laterality: N/A;    Family History  Problem Relation Age of Onset  . Heart attack Father   . Coronary artery disease Father   . Hypertension Mother   . Pancreatic cancer Mother     Died at 60.  . Cancer Mother     Pancreatic cancer  . Arthritis Sister   .  Hyperlipidemia Sister   . Prostate cancer Brother   . Cancer Brother     Bone Cancer secondary to Prostate Cancer  . Diabetes Neg Hx    Social History:  reports that he has quit smoking. His smoking use included Cigarettes. He has a 36 pack-year smoking history. He has never used smokeless tobacco. He reports that he does not drink alcohol or use illicit drugs.  Allergies:  Allergies  Allergen Reactions  . Atorvastatin     REACTION: muscles tense up   . Codeine Nausea And Vomiting  . Lisinopril Swelling    Lips and face swelling  . Statins Other (See Comments)    Muscle tense up     (Not in a hospital admission)  ROS: Out of a complete 14 system review, the patient complains of only the following symptoms, and all other reviewed systems are negative. +gait instability   Physical Examination: Filed Vitals:   03/29/15 1300  BP: 180/73  Pulse: 65  Temp:   Resp: 12   Physical Exam  Constitutional: He appears well-developed and well-nourished.  Psych: Affect appropriate to situation Eyes: No scleral injection HENT: No OP obstrucion Head: Normocephalic.  Cardiovascular: Normal rate and regular rhythm.  Respiratory: Effort normal and breath sounds normal.  GI: Soft. Bowel sounds are normal. No distension. There is no tenderness.  Skin: WDI  Neurologic Examination: Mental Status: Alert, oriented, thought content appropriate.  Speech fluent without evidence of aphasia.  Able to follow 3 step commands without difficulty. Cranial Nerves: II: funduscopic exam wnl bilaterally, visual fields grossly normal, pupils equal, round, reactive to light and accommodation III,IV, VI: ptosis not present, extra-ocular motions intact bilaterally V,VII: smile symmetric, facial light touch sensation normal bilaterally VIII: hearing normal bilaterally IX,X: gag reflex present XI: trapezius strength/neck flexion strength normal bilaterally XII: tongue strength normal  Motor: Right  : Upper extremity    Left:     Upper extremity 5/5 deltoid       5/5 deltoid 5/5 biceps      5/5 biceps  5/5 triceps      5/5 triceps 5/5 hand grip      5/5 hand grip  Lower extremity     Lower extremity 5/5 hip flexor      5/5 hip flexor 5/5 quadricep      5/5 quadriceps  5/5 hamstrings     5/5 hamstrings 5/5 plantar flexion       5/5 plantar flexion 5/5 plantar extension     5/5 plantar extension Tone and bulk:normal tone throughout; no atrophy noted Sensory: Pinprick and light touch intact throughout, bilaterally Deep Tendon Reflexes: 1+ and symmetric throughout Plantars: Right: downgoing   Left: downgoing Cerebellar: normal finger-to-nose, and normal heel-to-shin test Gait: deferred  Laboratory Studies:   Basic Metabolic Panel:  Recent Labs Lab 03/29/15 0909  NA 138  K 4.0  CL 103  CO2 23  GLUCOSE 98  BUN 11  CREATININE 1.08  CALCIUM 9.5    Liver Function Tests:  Recent Labs Lab 03/29/15 0909  AST 26  ALT 13*  ALKPHOS 79  BILITOT 0.6  PROT 7.1  ALBUMIN 3.9   No results for input(s): LIPASE, AMYLASE in the last 168 hours. No results for input(s): AMMONIA in the last 168 hours.  CBC:  Recent Labs Lab 03/29/15 0909  WBC 6.8  NEUTROABS 3.4  HGB 13.4  HCT 42.1  MCV 82.2  PLT 299    Cardiac Enzymes:  Recent Labs Lab 03/29/15 0909  TROPONINI <0.03    BNP: Invalid input(s): POCBNP  CBG: No results for input(s): GLUCAP in the last 168 hours.  Microbiology: Results for orders placed or performed during the hospital encounter of 07/06/14  Surgical pcr screen     Status: None   Collection Time: 07/07/14 12:52 AM  Result Value Ref Range Status   MRSA, PCR NEGATIVE NEGATIVE Final   Staphylococcus aureus NEGATIVE NEGATIVE Final    Comment:        The Xpert SA Assay (FDA approved for NASAL specimens in patients over 68 years of age), is one component of a comprehensive surveillance program.  Test performance has been validated by  [employer] for patients greater than or equal to 31 year old. It is not intended to diagnose infection nor to guide or monitor treatment.   Clostridium Difficile by PCR     Status: None   Collection Time: 07/13/14 12:51 PM  Result Value Ref Range Status   Toxigenic C Difficile by pcr NEGATIVE NEGATIVE Final    Coagulation Studies: No results for input(s): LABPROT, INR in the last 72 hours.  Urinalysis: No results for input(s): COLORURINE, LABSPEC, PHURINE, GLUCOSEU, HGBUR, BILIRUBINUR, KETONESUR, PROTEINUR, UROBILINOGEN, NITRITE, LEUKOCYTESUR in the last 168 hours.  Invalid input(s): APPERANCEUR  Lipid Panel:     Component Value Date/Time   CHOL 240* 01/29/2015 0441   TRIG 141 01/29/2015 0441   HDL 36* 01/29/2015  0441   CHOLHDL 6.7 01/29/2015 0441   VLDL 28 01/29/2015 0441   LDLCALC 176* 01/29/2015 0441    HgbA1C:  Lab Results  Component Value Date   HGBA1C 6.1* 01/29/2015    Urine Drug Screen:  No results found for: LABOPIA, COCAINSCRNUR, LABBENZ, AMPHETMU, THCU, LABBARB  Alcohol Level: No results for input(s): ETH in the last 168 hours.  Other results: EKG: normal EKG, normal sinus rhythm.  Imaging: Ct Head Wo Contrast  03/29/2015   CLINICAL DATA:  Dizziness, lightheadedness, nausea, and dull headache since yesterdayHx of stroke and hearing loss  EXAM: CT HEAD WITHOUT CONTRAST  TECHNIQUE: Contiguous axial images were obtained from the base of the skull through the vertex without intravenous contrast.  COMPARISON:  02/06/2015  FINDINGS: The ventricles are normal in size, for this patient's age, and normal in configuration.  There are no parenchymal masses or mass effect. There is no evidence of a cortical infarct. Patchy periventricular white matter hypoattenuation is noted consistent with mild to moderate chronic microvascular ischemic change. There are 2 small old lacune infarcts in the subcortical white matter of the right frontal lobe.  There are no extra-axial  masses or abnormal fluid collections.  There is no intracranial hemorrhage.  Left mastoid air cells are opacified. There is partial opacification of the head T10 [place] on the left. Clear right mastoid air cells. Visualized sinuses are clear.  IMPRESSION: 1. No acute intracranial abnormalities. 2. Age related volume loss, chronic microvascular ischemic change and 2 small white matter lacune infarcts. Opacification of left mastoid air cells and a portion of the left middle ear cavity, stable the prior CT.   Electronically Signed   By: Amie Portland M.D.   On: 03/29/2015 09:52   Mr Brain Wo Contrast  03/29/2015   CLINICAL DATA:  New onset of dizziness, nausea, and dull headache since yesterday. Patient too weak to get out of the car at ED.  EXAM: MRI HEAD WITHOUT CONTRAST  TECHNIQUE: Multiplanar, multiecho pulse sequences of the brain and surrounding structures were obtained without intravenous contrast.  COMPARISON:  CT head 03/29/2015. MR head 01/28/2015.  FINDINGS: There is a new area of restricted diffusion, tubular shape, involving a RIGHT frontal lobe MCA perforator, extending from the superior anterior RIGHT frontal cortex into the deep white matter as seen on image 40 series 3 and image 25 series 7, DWI. The previously noted areas of restricted diffusion, punctate foci of acute infarction in the RIGHT posterior frontal cortex and subcortical white matter have normalized on today's exam when correlated with 01/28/2015 MR.  No hemorrhage, mass lesion, hydrocephalus, or extra-axial fluid.  Generalized severe atrophy. Extensive small vessel disease with multiple remote lacunar infarcts. No visible vascular occlusion. No chronic hemorrhage. No acute findings in the orbits or paranasal sinuses. BILATERAL cataract extraction.  There is significant fluid accumulation throughout the LEFT middle ear and mastoid. This appearance was noted previously and is persistent. Correlate clinically for otomastoiditis.   IMPRESSION: New nonhemorrhagic tubular shaped area of restricted diffusion RIGHT frontal cortex and subcortical white matter, consistent with an acute perforating vessel infarct, RIGHT MCA territory.  Normalization of the previous areas of acute infarctions demonstrated in June.  Generalized severe atrophy and extensive small vessel disease.  Moderately extensive LEFT middle ear and mastoid fluid is asymmetric and persistent. Correlate clinically for otomastoiditis.   Electronically Signed   By: Elsie Stain M.D.   On: 03/29/2015 14:04   Dg Chest Portable 1 View  03/29/2015  CLINICAL DATA:  Weakness nausea headache history of hypertension and stroke  EXAM: PORTABLE CHEST - 1 VIEW  COMPARISON:  01/28/2015  FINDINGS: Not cardiac enlargement stable. Vascular pattern normal. Mild bibasilar infiltrates most consistent with atelectasis. No consolidation or effusion.  IMPRESSION: Mild bilateral lower lobe atelectasis.   Electronically Signed   By: Esperanza Heir M.D.   On: 03/29/2015 09:24    Assessment: 81 y.o. male hx of Hypertension, hyperlipidemia, recent CVA presenting with gait instability and fatigue. MRI brain shows acute right frontal infarct. Takes ASA and Plavix at home. Suspect small vessel disease. Admitted for stroke workup.   Plan: 1. HgbA1c, fasting lipid panel 2. MRA  of the brain without contrast 3. PT consult, OT consult, Speech consult 4. Echocardiogram 5. Carotid dopplers 6. Prophylactic therapy-continue ASA 81mg  and Plavix 75mg  daily 7. Risk factor modification 8. Telemetry monitoring 9. Frequent neuro checks 10. NPO until RN stroke swallow screen   Elspeth Cho, DO [facility] [phone]  If 7pm- 7am, please page neurology on call as listed in AMION. 03/29/2015, 2:52 PM

## 2015-03-29 NOTE — Progress Notes (Signed)
Upon shift change, patient grabbed his head on both sides.  Patient had wild eyed look; speech became slurred, patient nonverbal, frightened with hands in defensive motion.  Symptoms quickly resolved, and patient became alert and responsive again.  Patient has now had 3 different episodes since 1915.  Consulted with Rapid since I had no background info or history; she advised that I contact neuro with symptom change as well as [facility], because patient had several runs of bigeminal/trigeminal runs during the event.  Patiets vitals signs showed an Increase in HR and a decrease SPO2 stats.

## 2015-03-29 NOTE — ED Provider Notes (Signed)
CSN: [id number]     Arrival date & time 03/29/15  4098 History   First MD Initiated Contact with Patient 03/29/15 [phone]     Chief Complaint  Patient presents with  . Dizziness  . Fatigue      HPI Patient reports lightheadedness and dizziness which began yesterday but somewhat worsened today.  He states he had difficulty walking secondary to things "spinning".  He was too weak to get out of the vehicle on arrival to the emergency department and was brought immediately back to the resuscitation room due to lightheadedness and altered mental status.  Once the patient was laid back he began answering questions better.  No fevers or chills.  Reports mild headache.    Past Medical History  Diagnosis Date  . Barrett esophagus 09/2003  . GERD (gastroesophageal reflux disease)   . Hemorrhoids   . Diverticulosis   . Hyperlipidemia   . Hypertension   . Von Willebrand disease   . PUD (peptic ulcer disease)   . Hearing loss   . GI AVM (gastrointestinal arteriovenous vascular malformation)   . History of prostatitis   . Hemorrhoids   . Macular degeneration, wet     receiving intra-ocular injections ([facility]uen)  . Back pain     improved with gabapentin as of 2015  . Stroke    Past Surgical History  Procedure Laterality Date  . Inguinal hernia repair  2001  . Laparoscopic appendectomy N/A 07/07/2014    Procedure: APPENDECTOMY LAPAROSCOPIC;  Surgeon: Manus Rudd, MD;  Location: [facility] OR;  Service: General;  Laterality: N/A;   Family History  Problem Relation Age of Onset  . Heart attack Father   . Coronary artery disease Father   . Hypertension Mother   . Pancreatic cancer Mother     Died at 61.  . Cancer Mother     Pancreatic cancer  . Arthritis Sister   . Hyperlipidemia Sister   . Prostate cancer Brother   . Cancer Brother     Bone Cancer secondary to Prostate Cancer  . Diabetes Neg Hx    History  Substance Use Topics  . Smoking status: Former Smoker -- 1.00 packs/day for 36 years     Types: Cigarettes  . Smokeless tobacco: Never Used     Comment: Quit in 1983  . Alcohol Use: No    Review of Systems  All other systems reviewed and are negative.     Allergies  Atorvastatin; Codeine; Lisinopril; and Statins  Home Medications   Prior to Admission medications   Medication Sig Start Date End Date Taking? Authorizing Provider  acetaminophen (TYLENOL) 500 MG tablet Take 500 mg by mouth every 6 (six) hours as needed for mild pain, moderate pain or headache.   Yes Historical Provider, MD  Alirocumab (PRALUENT) 75 MG/ML SOPN Inject 75 mg into the skin every 14 (fourteen) days. 02/06/15  Yes Joaquim Nam, MD  aspirin EC 81 MG tablet Take 81 mg by mouth every evening.   Yes Historical Provider, MD  Bevacizumab (AVASTIN IV) Place into the right eye See admin instructions. Place into the right eye every 9 weeks. Last dose was on 03-17-15   Yes Historical Provider, MD  cetirizine (ZYRTEC) 10 MG tablet Take 10 mg by mouth daily as needed for allergies.    Yes Historical Provider, MD  clopidogrel (PLAVIX) 75 MG tablet Take 1 tablet (75 mg total) by mouth daily. 02/27/15  Yes Joaquim Nam, MD  diltiazem (DILACOR XR) 180 MG  24 hr capsule Take 1 capsule (180 mg total) by mouth daily. 02/27/15  Yes Joaquim Nam, MD  fluticasone (FLONASE) 50 MCG/ACT nasal spray Place 2 sprays into both nostrils daily as needed for allergies. 10/07/14  Yes Joaquim Nam, MD  gabapentin (NEURONTIN) 300 MG capsule TAKE ONE CAPSULE BY MOUTH TWICE A DAY 03/25/15  Yes Joaquim Nam, MD  gemfibrozil (LOPID) 600 MG tablet TAKE 1 TABLET BY MOUTH TWICE A DAY 02/09/15  Yes Joaquim Nam, MD  Hypromellose (ARTIFICIAL TEARS OP) Apply 1 drop to eye daily as needed (itchy / watery eyes).   Yes Historical Provider, MD  loperamide (IMODIUM) 2 MG capsule Take 1 capsule (2 mg total) by mouth 3 (three) times daily as needed for diarrhea or loose stools. 07/15/14  Yes Elease Etienne, MD  metoprolol succinate  (TOPROL-XL) 25 MG 24 hr tablet TAKE ONE TABLET BY MOUTH EVERY NIGHT AT BEDTIME 02/09/15  Yes Joaquim Nam, MD  Multiple Vitamins-Minerals (OCUVITE ADULT 50+ PO) Take 1 capsule by mouth daily.     Yes Historical Provider, MD  pantoprazole (PROTONIX) 40 MG tablet Take 1 tablet (40 mg total) by mouth 2 (two) times daily before a meal. 07/15/14 07/15/15 Yes Elease Etienne, MD  pantoprazole (PROTONIX) 40 MG tablet TAKE ONE TABLET BY MOUTH EVERY MORNING 03/12/15  Yes Joaquim Nam, MD  ranitidine (ZANTAC) 150 MG tablet TAKE 1 TABLET BY MOUTH TWICE A DAY 12/24/14  Yes Joaquim Nam, MD  sucralfate (CARAFATE) 1 G tablet TAKE 1 TABLET BY MOUTH BEFORE LAYING DOWN FOR NAP OR BEDTIME 11/24/14  Yes Joaquim Nam, MD  acetaminophen (TYLENOL) 325 MG tablet Take 2 tablets (650 mg total) by mouth every 6 (six) hours as needed for mild pain, moderate pain, fever or headache (or Fever >/= 101). 07/15/14   Elease Etienne, MD  furosemide (LASIX) 40 MG tablet TAKE 1 TABLET BY MOUTH DAILY Patient not taking: Reported on 02/18/2015 11/20/14   Joaquim Nam, MD  potassium chloride SA (K-DUR,KLOR-CON) 20 MEQ tablet TAKE 1 TABLET BY MOUTH DAILY Patient not taking: Reported on 02/18/2015 07/03/14   Joaquim Nam, MD   BP 180/73 mmHg  Pulse 65  Temp(Src) 97.7 F (36.5 C) (Oral)  Resp 12  SpO2 96% Physical Exam  Constitutional: He is oriented to person, place, and time. He appears well-developed and well-nourished.  HENT:  Head: Normocephalic and atraumatic.  Eyes: EOM are normal. Pupils are equal, round, and reactive to light.  Neck: Normal range of motion.  Cardiovascular: Normal rate, regular rhythm, normal heart sounds and intact distal pulses.   Pulmonary/Chest: Effort normal and breath sounds normal. No respiratory distress.  Abdominal: Soft. He exhibits no distension. There is no tenderness.  Musculoskeletal: Normal range of motion.  Neurological: He is alert and oriented to person, place, and time.   5/5 strength in major muscle groups of  bilateral upper and lower extremities. Speech normal. No facial asymetry.  Gait not tested  Skin: Skin is warm and dry.  Psychiatric: He has a normal mood and affect. Judgment normal.  Nursing note and vitals reviewed.   ED Course  Procedures (including critical care time) Labs Review Labs Reviewed  COMPREHENSIVE METABOLIC PANEL - Abnormal; Notable for the following:    ALT 13 (*)    GFR calc non Af Amer 60 (*)    All other components within normal limits  CBC WITH DIFFERENTIAL/PLATELET  TROPONIN I  CBG MONITORING, ED  Imaging Review Ct Head Wo Contrast  03/29/2015   CLINICAL DATA:  Dizziness, lightheadedness, nausea, and dull headache since yesterdayHx of stroke and hearing loss  EXAM: CT HEAD WITHOUT CONTRAST  TECHNIQUE: Contiguous axial images were obtained from the base of the skull through the vertex without intravenous contrast.  COMPARISON:  02/06/2015  FINDINGS: The ventricles are normal in size, for this patient's age, and normal in configuration.  There are no parenchymal masses or mass effect. There is no evidence of a cortical infarct. Patchy periventricular white matter hypoattenuation is noted consistent with mild to moderate chronic microvascular ischemic change. There are 2 small old lacune infarcts in the subcortical white matter of the right frontal lobe.  There are no extra-axial masses or abnormal fluid collections.  There is no intracranial hemorrhage.  Left mastoid air cells are opacified. There is partial opacification of the head T10 [place] on the left. Clear right mastoid air cells. Visualized sinuses are clear.  IMPRESSION: 1. No acute intracranial abnormalities. 2. Age related volume loss, chronic microvascular ischemic change and 2 small white matter lacune infarcts. Opacification of left mastoid air cells and a portion of the left middle ear cavity, stable the prior CT.   Electronically Signed   By: Amie Portland M.D.   On:  03/29/2015 09:52   Mr Brain Wo Contrast  03/29/2015   CLINICAL DATA:  New onset of dizziness, nausea, and dull headache since yesterday. Patient too weak to get out of the car at ED.  EXAM: MRI HEAD WITHOUT CONTRAST  TECHNIQUE: Multiplanar, multiecho pulse sequences of the brain and surrounding structures were obtained without intravenous contrast.  COMPARISON:  CT head 03/29/2015. MR head 01/28/2015.  FINDINGS: There is a new area of restricted diffusion, tubular shape, involving a RIGHT frontal lobe MCA perforator, extending from the superior anterior RIGHT frontal cortex into the deep white matter as seen on image 40 series 3 and image 25 series 7, DWI. The previously noted areas of restricted diffusion, punctate foci of acute infarction in the RIGHT posterior frontal cortex and subcortical white matter have normalized on today's exam when correlated with 01/28/2015 MR.  No hemorrhage, mass lesion, hydrocephalus, or extra-axial fluid.  Generalized severe atrophy. Extensive small vessel disease with multiple remote lacunar infarcts. No visible vascular occlusion. No chronic hemorrhage. No acute findings in the orbits or paranasal sinuses. BILATERAL cataract extraction.  There is significant fluid accumulation throughout the LEFT middle ear and mastoid. This appearance was noted previously and is persistent. Correlate clinically for otomastoiditis.  IMPRESSION: New nonhemorrhagic tubular shaped area of restricted diffusion RIGHT frontal cortex and subcortical white matter, consistent with an acute perforating vessel infarct, RIGHT MCA territory.  Normalization of the previous areas of acute infarctions demonstrated in June.  Generalized severe atrophy and extensive small vessel disease.  Moderately extensive LEFT middle ear and mastoid fluid is asymmetric and persistent. Correlate clinically for otomastoiditis.   Electronically Signed   By: Elsie Stain M.D.   On: 03/29/2015 14:04   Dg Chest Portable 1  View  03/29/2015   CLINICAL DATA:  Weakness nausea headache history of hypertension and stroke  EXAM: PORTABLE CHEST - 1 VIEW  COMPARISON:  01/28/2015  FINDINGS: Not cardiac enlargement stable. Vascular pattern normal. Mild bibasilar infiltrates most consistent with atelectasis. No consolidation or effusion.  IMPRESSION: Mild bilateral lower lobe atelectasis.   Electronically Signed   By: Esperanza Heir M.D.   On: 03/29/2015 09:24  I personally reviewed the imaging tests through PACS  system I reviewed available ER/hospitalization records through the EMR    EKG Interpretation   Date/Time:  Sunday March 29 2015 09:09:46 EDT Ventricular Rate:  60 PR Interval:  177 QRS Duration: 98 QT Interval:  418 QTC Calculation: 418 R Axis:   47 Text Interpretation:  Sinus rhythm Abnormal R-wave progression, early  transition No significant change was found Confirmed by Tinslee Klare  MD, Brenten Janney  ([id number]) on 03/29/2015 11:09:47 AM      MDM   Final diagnoses:  Acute ischemic stroke    After initial attempts at symptomatic treatment with benzodiazepines and fluids the patient continued to feel poorly and subhepatic.  MRI demonstrates acute infarct.  Patient will be admitted the hospital.  Neuro hospitalist consultation.    Azalia Bilis, MD 03/29/15 1539

## 2015-03-29 NOTE — ED Notes (Signed)
Pt CBG 97 

## 2015-03-29 NOTE — Progress Notes (Signed)
Patient appeared to be resolving, when shift report was being given patient became very confused and was holding his head and complaining of chest pain the night nurse was present for this event and he also had a moment of slurred speech.

## 2015-03-29 NOTE — Significant Event (Signed)
Rapid Response Event Note  Overview: Time Called: 1916 Arrival Time: 1920 Event Type: Neurologic  Initial Focused Assessment: Patient with sudden altered mental status per RN, holding head, slurred speech. Upon arrival patient describes a "funny" feeling in his head especially when he tilts his head to one side.  He had difficulty describing the "funny" feeling.  He did state that it is a light headed feeling. Per RN patient much improved but still not as interactive as he was earlier. No focal deficit noted.  Patient states that he has recently felt this way at home on occasion. HR with occasional PVC, at times he will have a few minutes of bigeminal and Trigeminal PVCs. BP 161/70  SR with PVCs 80-90  RR 16 O2 sat 98% on RA Temp 98.2  Interventions: HOB flat. RN Notified Neuro hospitalist and [facility] of event. RN to call if needed.  Will continue to monitor.  Event Summary: Name of Physician Notified: Dr Thad Ranger and [facility] at 1930    at    Outcome: Stayed in room and stabalized  Event End Time: 2000  Marcellina Millin

## 2015-03-29 NOTE — ED Notes (Signed)
Patient transported to MRI 

## 2015-03-30 ENCOUNTER — Ambulatory Visit (HOSPITAL_COMMUNITY): Payer: Medicare HMO

## 2015-03-30 DIAGNOSIS — K2270 Barrett's esophagus without dysplasia: Secondary | ICD-10-CM

## 2015-03-30 LAB — LIPID PANEL
Cholesterol: 139 mg/dL (ref 0–200)
HDL: 37 mg/dL — ABNORMAL LOW (ref >40)
LDL Cholesterol: 84 mg/dL (ref 0–99)
Total CHOL/HDL Ratio: 3.8 RATIO
Triglycerides: 90 mg/dL (ref <150)
VLDL: 18 mg/dL (ref 0–40)

## 2015-03-30 MED ORDER — METOPROLOL SUCCINATE ER 25 MG PO TB24
25.0000 mg | ORAL_TABLET | Freq: Every day | ORAL | Status: DC
  Administered 2015-03-30: 25 mg via ORAL
  Filled 2015-03-30: qty 1

## 2015-03-30 NOTE — Progress Notes (Signed)
Occupational Therapy Evaluation Patient Details Name: [name] MRN: [id number] DOB: [birth year] Today's Date: 03/30/2015    History of Present Illness Patient is a 81 y/o male w/ hx of HTN, HLD, COPD, CVA ~1 month ago (01/2015) and von Willebrand's disease presents with dizziness and gait instability. MRI - + infarct in right frontal cortex and subcortical white matter. CT angio from prior-occluded left vertebral artery at the level of the foramen magnum with dominant right vert with no stenosis. Workup pending.    Clinical Impression   Pt appears close to baseline level with ADL and mobility. Asking about his dizziness adn wanting to talk with a "stroke doctor". Pt safe to D/C home with intermittent S when medically ready. Discussed home safety recommendations with family who verbalized understanding.     Follow Up Recommendations  No OT follow up;Supervision - Intermittent    Equipment Recommendations  Other (comment) (grab bars in shower. Family to install)    Recommendations for Other Services       Precautions / Restrictions Precautions Precautions: Fall Restrictions Weight Bearing Restrictions: No      Mobility Bed Mobility Overal bed mobility: Modified Independent                Transfers Overall transfer level: Modified independent Equipment used: None Transfers: Sit to/from Stand Sit to Stand: Min guard         General transfer comment: Min guard for stability/safety. BLEs stabilizing on bed for support.     Balance Overall balance assessment: No apparent balance deficits (not formally assessed) Sitting-balance support: Feet supported;No upper extremity supported Sitting balance-Leahy Scale: Good     Standing balance support: During functional activity Standing balance-Leahy Scale: Fair Standing balance comment: Able to perform static standing - urination in bathroom without support. Min A for gait.                            ADL                                          General ADL Comments: close to baseline Discussed recommendations for home safety and to reduce risk of falls.      Vision     Perception     Praxis      Pertinent Vitals/Pain Pain Assessment: Faces Faces Pain Scale: Hurts little more Pain Location: head Pain Descriptors / Indicators: Aching;Pressure Pain Intervention(s): Limited activity within patient's tolerance     Hand Dominance Right   Extremity/Trunk Assessment Upper Extremity Assessment Upper Extremity Assessment: Overall WFL for tasks assessed   Lower Extremity Assessment Lower Extremity Assessment: Defer to PT evaluation RLE Sensation:  (WFL.) LLE Sensation:  (WFL.)   Cervical / Trunk Assessment Cervical / Trunk Assessment: Normal   Communication Communication Communication: HOH   Cognition Arousal/Alertness: Awake/alert Behavior During Therapy: WFL for tasks assessed/performed Overall Cognitive Status: Within Functional Limits for tasks assessed                 General Comments: Family states he is at his bas[place]   General Comments       Exercises       Shoulder Instructions      Home Living Family/patient expects to be discharged to:: Private residence Living Arrangements: Spouse/significant other   Type of Home: House Home Access: Stairs to enter Entrance  Stairs-Number of Steps: 1 Entrance Stairs-Rails: None Home Layout: One level     Bathroom Shower/Tub: Producer, television/film/video: Standard     Home Equipment: Environmental consultant - 2 wheels;Wheelchair - Manufacturing systems engineer - built in      Lives With: Spouse    Prior Functioning/Environment Level of Independence: Independent        Comments: Pt very active PTA- Geophysical data processor.    OT Diagnosis: Generalized weakness   OT Problem List: Other (comment) ("dizziness")   OT Treatment/Interventions:      OT Goals(Current goals can be found in the care  plan section) Acute Rehab OT Goals Patient Stated Goal: talk to a doctor OT Goal Formulation: All assessment and education complete, DC therapy  OT Frequency:     Barriers to D/C:            Co-evaluation              End of Session Equipment Utilized During Treatment: Gait belt Nurse Communication: Mobility status  Activity Tolerance: Patient tolerated treatment well Patient left: in bed;with call bell/phone within reach;with family/visitor present   Time: 1610-9604 OT Time Calculation (min): 27 min Charges:  OT General Charges $OT Visit: 1 Procedure OT Evaluation $Initial OT Evaluation Tier I: 1 Procedure OT Treatments $Self Care/Home Management : 8-22 mins G-Codes:    Makaylah Oddo,HILLARY 2015/04/22, 12:30 PM   [facility], OTR/L  [phone] 04/22/15

## 2015-03-30 NOTE — Discharge Summary (Signed)
Physician Discharge Summary  [name]:[id number] DOB: 1926-05-07 DOA: 03/29/2015  PCP: Martin Givens, MD  Admit date: 03/29/2015 Discharge date: 03/30/2015  Recommendations for Outpatient Follow-up:  1. Pt will need to follow up with PCP in 2 weeks post discharge 2. Please obtain BMP 1-2 weeks   Discharge Diagnoses:  Acute nonhemorrhagic stroke  -03/29/2015 MRI brain acute nonhemorrhagic infarct right frontal cortex  -03/29/15--MRA brain--chronic bilateral PCA stenosis, chronic left VA occlusion, no new acute findings since 01/29/15 -01/29/2015 CT angiogram of the head and neck reveals left vertebral artery occlusion  -Will not repeat carotid duplex  -01/30/2015 echocardiogram shows EF 55%, no intracardiac thrombi--will not repeat transthoracic echocardiogram unless neurology feels compelled  -Continue aspirin 81 mg daily and Plavix 75 mg daily  -PT--no PT follow up -01/29/2015 hemoglobin A1c 6.1----will not repeat at this time  -01/29/2015 LDL 176--patient takes Praluent every 14 days  -03/30/15--repeat LDL-->84-->continue Praluent--defer adding additional agent to neurology; pt intolerant to statins; continue lopid outpt -appreciate neurology followup -The case was discussed with the stroke team, Dr. Pearlean Brownie -Dr. Pearlean Brownie he felt that the patient's new infarct was likely secondary to small vessel disease in the setting of dehydration--he did not feel that any further workup was necessary at this time other than what has already been performed noted above -Neurology recommended continuing aspirin 81 mg daily with Plavix 75 mg until 3 months are completed, then Plavix alone -Stroke Team did not feel repeat echocardiogram and carotid duplex was necessary at this time as the patient recently had CT anigo of the neck on 01/29/2015 and echo on 01/30/15 -PT/OT--did not feel pt needed additional followup Hypertension  -Allow for permissive hypertension the first 24 hours  -restart  metoprolol succinate 25 mg daily and diltiazem -Hydralazine IV prn SBP >210 -The patient no longer takes furosemide or potassium supplementation Impaired glucose tolerance  -01/29/2015 hemoglobin A1c 6.1  -follow up with PCP GERD with history of Barrett's esophagus  -Continue PPI, H2 blocker, and sulcrafate Dizziness  -Likely multifactorial including the patient's acute stroke and possible left mastoid/left middle ear fluid noted on MRI  -follow up with PCP -symptoms improved on day of d/c -UA--no pyuria Intestinal AVMs -Hgb is stable at baseline (~12-13)  Discharge Condition: stable  Disposition: home  Diet:heart healthy Wt Readings from Last 3 Encounters:  03/29/15 74.753 kg (164 lb 12.8 oz)  02/18/15 72.802 kg (160 lb 8 oz)  02/09/15 72.235 kg (159 lb 4 oz)    History of present illness:  81 year old male with a history of Barrett's esophagus, GERD, diverticulitis, hypertension, hyperlipidemia, GI AVMs, macular degeneration, and stroke in June 2016 presented with dizziness and gait instability. The patient was feeling fine on 03/28/2015. The patient mowed his lawn with a riding mower on the day prior to admission. On the evening of 03/28/2015 he began having some dizziness. When he woke up on the morning of 03/29/2015, the patient had difficulty walking with gait instability. He denies any falling or syncope.He denies any focal extremity weakness or visual loss. The patient endorses 100% compliance with all his medications including Plavix and aspirin. The patient was recently discharged from the hospital on 01/31/2015 after suffering an acute ischemic stroke in the right precentral gyrus. At that time, the patient was started on Plavix and continued on aspirin 81 mg daily at the time of discharge.   Consultants: Neurology  Discharge Exam: Filed Vitals:   03/30/15 1038  BP: 98/48  Pulse: 68  Temp: 98 F (36.7 C)  Resp:    Filed Vitals:   03/30/15 0002 03/30/15 0200  03/30/15 0400 03/30/15 1038  BP: 159/67 140/57 140/57 98/48  Pulse: 77 71 67 68  Temp: 97.5 F (36.4 C) 97.7 F (36.5 C) 97.7 F (36.5 C) 98 F (36.7 C)  TempSrc: Oral Oral Oral Oral  Resp:      Height:      Weight:      SpO2: 92% 90% 93% 99%   General: A&O x 3, NAD, pleasant, cooperative Cardiovascular: RRR, no rub, no gallop, no S3 Respiratory: CTAB, no wheeze, no rhonchi Abdomen:soft, nontender, nondistended, positive bowel sounds Extremities: No edema, No lymphangitis, no petechiae  Discharge Instructions      Discharge Instructions    Diet - low sodium heart healthy    Complete by:  As directed      Increase activity slowly    Complete by:  As directed             Medication List    STOP taking these medications        furosemide 40 MG tablet  Commonly known as:  LASIX     potassium chloride SA 20 MEQ tablet  Commonly known as:  K-DUR,KLOR-CON      TAKE these medications        acetaminophen 500 MG tablet  Commonly known as:  TYLENOL  Take 500 mg by mouth every 6 (six) hours as needed for mild pain, moderate pain or headache.     Alirocumab 75 MG/ML Sopn  Commonly known as:  PRALUENT  Inject 75 mg into the skin every 14 (fourteen) days.     ARTIFICIAL TEARS OP  Apply 1 drop to eye daily as needed (itchy / watery eyes).     aspirin EC 81 MG tablet  Take 81 mg by mouth every evening.     AVASTIN IV  Place into the right eye See admin instructions. Place into the right eye every 9 weeks. Last dose was on 03-17-15     cetirizine 10 MG tablet  Commonly known as:  ZYRTEC  Take 10 mg by mouth daily as needed for allergies.     clopidogrel 75 MG tablet  Commonly known as:  PLAVIX  Take 1 tablet (75 mg total) by mouth daily.     diltiazem 180 MG 24 hr capsule  Commonly known as:  DILACOR XR  Take 1 capsule (180 mg total) by mouth daily.     fluticasone 50 MCG/ACT nasal spray  Commonly known as:  FLONASE  Place 2 sprays into both nostrils daily  as needed for allergies.     gabapentin 300 MG capsule  Commonly known as:  NEURONTIN  TAKE ONE CAPSULE BY MOUTH TWICE A DAY     gemfibrozil 600 MG tablet  Commonly known as:  LOPID  TAKE 1 TABLET BY MOUTH TWICE A DAY     loperamide 2 MG capsule  Commonly known as:  IMODIUM  Take 1 capsule (2 mg total) by mouth 3 (three) times daily as needed for diarrhea or loose stools.     metoprolol succinate 25 MG 24 hr tablet  Commonly known as:  TOPROL-XL  TAKE ONE TABLET BY MOUTH EVERY NIGHT AT BEDTIME     OCUVITE ADULT 50+ PO  Take 1 capsule by mouth daily.     pantoprazole 40 MG tablet  Commonly known as:  PROTONIX  Take 1 tablet (40 mg total) by mouth 2 (two) times daily before a meal.  ranitidine 150 MG tablet  Commonly known as:  ZANTAC  TAKE 1 TABLET BY MOUTH TWICE A DAY     sucralfate 1 G tablet  Commonly known as:  CARAFATE  TAKE 1 TABLET BY MOUTH BEFORE LAYING DOWN FOR NAP OR BEDTIME         The results of significant diagnostics from this hospitalization (including imaging, microbiology, ancillary and laboratory) are listed below for reference.    Significant Diagnostic Studies: Ct Head Wo Contrast  03/29/2015   CLINICAL DATA:  Dizziness, lightheadedness, nausea, and dull headache since yesterdayHx of stroke and hearing loss  EXAM: CT HEAD WITHOUT CONTRAST  TECHNIQUE: Contiguous axial images were obtained from the base of the skull through the vertex without intravenous contrast.  COMPARISON:  02/06/2015  FINDINGS: The ventricles are normal in size, for this patient's age, and normal in configuration.  There are no parenchymal masses or mass effect. There is no evidence of a cortical infarct. Patchy periventricular white matter hypoattenuation is noted consistent with mild to moderate chronic microvascular ischemic change. There are 2 small old lacune infarcts in the subcortical white matter of the right frontal lobe.  There are no extra-axial masses or abnormal fluid  collections.  There is no intracranial hemorrhage.  Left mastoid air cells are opacified. There is partial opacification of the head T10 [place] on the left. Clear right mastoid air cells. Visualized sinuses are clear.  IMPRESSION: 1. No acute intracranial abnormalities. 2. Age related volume loss, chronic microvascular ischemic change and 2 small white matter lacune infarcts. Opacification of left mastoid air cells and a portion of the left middle ear cavity, stable the prior CT.   Electronically Signed   By: Amie Portland M.D.   On: 03/29/2015 09:52   Mr Brain Wo Contrast  03/29/2015   CLINICAL DATA:  New onset of dizziness, nausea, and dull headache since yesterday. Patient too weak to get out of the car at ED.  EXAM: MRI HEAD WITHOUT CONTRAST  TECHNIQUE: Multiplanar, multiecho pulse sequences of the brain and surrounding structures were obtained without intravenous contrast.  COMPARISON:  CT head 03/29/2015. MR head 01/28/2015.  FINDINGS: There is a new area of restricted diffusion, tubular shape, involving a RIGHT frontal lobe MCA perforator, extending from the superior anterior RIGHT frontal cortex into the deep white matter as seen on image 40 series 3 and image 25 series 7, DWI. The previously noted areas of restricted diffusion, punctate foci of acute infarction in the RIGHT posterior frontal cortex and subcortical white matter have normalized on today's exam when correlated with 01/28/2015 MR.  No hemorrhage, mass lesion, hydrocephalus, or extra-axial fluid.  Generalized severe atrophy. Extensive small vessel disease with multiple remote lacunar infarcts. No visible vascular occlusion. No chronic hemorrhage. No acute findings in the orbits or paranasal sinuses. BILATERAL cataract extraction.  There is significant fluid accumulation throughout the LEFT middle ear and mastoid. This appearance was noted previously and is persistent. Correlate clinically for otomastoiditis.  IMPRESSION: New nonhemorrhagic  tubular shaped area of restricted diffusion RIGHT frontal cortex and subcortical white matter, consistent with an acute perforating vessel infarct, RIGHT MCA territory.  Normalization of the previous areas of acute infarctions demonstrated in June.  Generalized severe atrophy and extensive small vessel disease.  Moderately extensive LEFT middle ear and mastoid fluid is asymmetric and persistent. Correlate clinically for otomastoiditis.   Electronically Signed   By: Elsie Stain M.D.   On: 03/29/2015 14:04   Dg Chest [facility]  03/29/2015   CLINICAL DATA:  Patient with acute ischemic stroke. Shortness of breath.  EXAM: PORTABLE CHEST - 1 VIEW  COMPARISON:  Chest radiograph 03/29/2015  FINDINGS: Monitoring leads overlie the patient. Stable cardiac and mediastinal contours. No consolidative pulmonary opacities. Left lateral lower hemi thorax excluded from view. No pleural effusion or pneumothorax.  IMPRESSION: No acute cardiopulmonary process.   Electronically Signed   By: Annia Belt M.D.   On: 03/29/2015 17:39   Dg Chest Portable 1 View  03/29/2015   CLINICAL DATA:  Weakness nausea headache history of hypertension and stroke  EXAM: PORTABLE CHEST - 1 VIEW  COMPARISON:  01/28/2015  FINDINGS: Not cardiac enlargement stable. Vascular pattern normal. Mild bibasilar infiltrates most consistent with atelectasis. No consolidation or effusion.  IMPRESSION: Mild bilateral lower lobe atelectasis.   Electronically Signed   By: Esperanza Heir M.D.   On: 03/29/2015 09:24   Mr Maxine Glenn Head/brain Wo Cm  03/29/2015   CLINICAL DATA:  Acute infarct.  EXAM: MRA HEAD WITHOUT CONTRAST  TECHNIQUE: Angiographic images of the Circle of Willis were obtained using MRA technique without intravenous contrast.  COMPARISON:  01/29/2015  FINDINGS: Re- demonstrated extensive left and small right mastoid effusion with symmetric clear nasopharynx.  Intermittent motion degradation, especially at the cranial margin of this scan.  Symmetric  carotid circulation. There is no major branch occlusion or new stenosis. Asymmetric mild narrowing of the distal left M1 segment is at least partly due to early branching of the anterior temporal. There is scattered luminal narrowing and irregularity in the M3 vessels compatible with atherosclerosis. There is a 2 mm outpouching from the supraclinoid left ICA directed posteriorly and inferiorly with a conical shape. An emanating vessel is not visible by MRA.  Strongly dominant right vertebral artery with chronic occlusion of the left V4 segment. The early branching left PICA has continued robust flow. Good flow also present in the right high PICA. Widely patent basilar artery. There is a chronic moderate to high-grade stenosis of the right anterior P2 segment and high-grade stenoses of bilateral posterior P2 segments, just after the bifurcation on the left and near the P3 segment junction on the right  No posterior circulation aneurysm.  IMPRESSION: 1. No acute arterial finding or change from 01/29/2015. 2. Chronic left V4 segment occlusion with patent early branching left PICA. 3. Chronic bilateral PCA distribution moderate to advanced stenoses. 4. 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   Electronically Signed   By: Marnee Spring M.D.   On: 03/29/2015 17:38     Microbiology: Recent Results (from the past 240 hour(s))  Urine culture     Status: None (Preliminary result)   Collection Time: 03/29/15  5:18 PM  Result Value Ref Range Status   Specimen Description URINE, CLEAN CATCH  Final   Special Requests NONE  Final   Culture CULTURE REINCUBATED FOR BETTER GROWTH  Final   Report Status PENDING  Incomplete     Labs: Basic Metabolic Panel:  Recent Labs Lab 03/29/15 0909  NA 138  K 4.0  CL 103  CO2 23  GLUCOSE 98  BUN 11  CREATININE 1.08  CALCIUM 9.5   Liver Function Tests:  Recent Labs Lab 03/29/15 0909  AST 26  ALT 13*  ALKPHOS 79  BILITOT 0.6  PROT 7.1  ALBUMIN 3.9    No results for input(s): LIPASE, AMYLASE in the last 168 hours. No results for input(s): AMMONIA in the last 168 hours. CBC:  Recent  Labs Lab 03/29/15 0909  WBC 6.8  NEUTROABS 3.4  HGB 13.4  HCT 42.1  MCV 82.2  PLT 299   Cardiac Enzymes:  Recent Labs Lab 03/29/15 0909  TROPONINI <0.03   BNP: Invalid input(s): POCBNP CBG: No results for input(s): GLUCAP in the last 168 hours.  Time coordinating discharge:  Greater than 30 minutes  Signed:  Heaven Wandell, DO [facility] Pager: [phone] 03/30/2015, 1:45 PM

## 2015-03-30 NOTE — Progress Notes (Signed)
PROGRESS NOTE  [name] WGN:[id number] DOB: Nov 15, 1925 DOA: 03/29/2015 PCP: [name], MD  Brief History 81-year-old male with a history of Barrett's esophagus, GERD, diverticulitis, hypertension, hyperlipidemia, GI AVMs, macular degeneration, and stroke in June 2016 presented with dizziness and gait instability. The patient was feeling fine on 03/28/2015. The patient mowed his lawn with a riding mower on the day prior to admission. On the evening of 03/28/2015 he began having some dizziness. When he woke up on the morning of 03/29/2015, the patient had difficulty walking with gait instability. He denies any falling or syncope.He denies any focal extremity weakness or visual loss. The patient endorses 100% compliance with all his medications including Plavix and aspirin. The patient was recently discharged from the hospital on 01/31/2015 after suffering an acute ischemic stroke in the right precentral gyrus. At that time, the patient was started on Plavix and continued on aspirin 81 mg daily at the time of discharge.  Assessment/Plan: Acute nonhemorrhagic stroke  -03/29/2015 MRI brain acute nonhemorrhagic infarct right frontal cortex  -MRA brain--chronic bilateral PCA stenosis, chronic left VA occlusion, no new acute findings since 01/29/15 -01/29/2015 CT angiogram of the head and neck reveals left vertebral artery occlusion  -Will not repeat carotid duplex  -01/30/2015 echocardiogram shows EF 55%, no intracardiac thrombi--will not repeat transthoracic echocardiogram unless neurology feels compelled  -Continue aspirin 81 mg daily and Plavix 75 mg daily  -PT--no PT follow up -01/29/2015 hemoglobin A1c 6.1----will not repeat at this time  -01/29/2015 LDL 176--patient takes Praluent every 14 days  -03/30/15--repeat LDL-->84-->continue Praluent--defer adding additional agent to neurology -appreciate neurology followup Hypertension  -Allow for permissive hypertension the first 24  hours  -restart metoprolol succinate 25 mg daily -Hydralazine IV prn SBP >210 -The patient no longer takes furosemide or potassium supplementation Impaired glucose tolerance  -01/29/2015 hemoglobin A1c 6.1  GERD with history of Barrett's esophagus  -Continue PPI, H2 blocker, and sulcrafate Dizziness  -Likely multifactorial including the patient's acute stroke and possible left mastoid/left middle ear fluid noted on MRI  -Check orthostatic vital signs  -UA--no pyuria Intestinal AVMs -Hgb is stable at baseline (~12-13)   Family Communication:   Wife and daughter updated at beside;  Total time 35 min, >50% spent counseling and coordinating care Disposition Plan:   Home when medically stable       Procedures/Studies: Ct Head Wo Contrast  03/29/2015   CLINICAL DATA:  Dizziness, lightheadedness, nausea, and dull headache since yesterdayHx of stroke and hearing loss  EXAM: CT HEAD WITHOUT CONTRAST  TECHNIQUE: Contiguous axial images were obtained from the base of the skull through the vertex without intravenous contrast.  COMPARISON:  02/06/2015  FINDINGS: The ventricles are normal in size, for this patient's age, and normal in configuration.  There are no parenchymal masses or mass effect. There is no evidence of a cortical infarct. Patchy periventricular white matter hypoattenuation is noted consistent with mild to moderate chronic microvascular ischemic change. There are 2 small old lacune infarcts in the subcortical white matter of the right frontal lobe.  There are no extra-axial masses or abnormal fluid collections.  There is no intracranial hemorrhage.  Left mastoid air cells are opacified. There is partial opacification of the head T10 [place] on the left. Clear right mastoid air cells. Visualized sinuses are clear.  IMPRESSION: 1. No acute intracranial abnormalities. 2. Age related volume loss, chronic microvascular ischemic change and 2 small white matter lacune infarcts.  Opacification  of left mastoid air cells and a portion of the left middle ear cavity, stable the prior CT.   Electronically Signed   By: Amie Portland M.D.   On: 03/29/2015 09:52   Mr Brain Wo Contrast  03/29/2015   CLINICAL DATA:  New onset of dizziness, nausea, and dull headache since yesterday. Patient too weak to get out of the car at ED.  EXAM: MRI HEAD WITHOUT CONTRAST  TECHNIQUE: Multiplanar, multiecho pulse sequences of the brain and surrounding structures were obtained without intravenous contrast.  COMPARISON:  CT head 03/29/2015. MR head 01/28/2015.  FINDINGS: There is a new area of restricted diffusion, tubular shape, involving a RIGHT frontal lobe MCA perforator, extending from the superior anterior RIGHT frontal cortex into the deep white matter as seen on image 40 series 3 and image 25 series 7, DWI. The previously noted areas of restricted diffusion, punctate foci of acute infarction in the RIGHT posterior frontal cortex and subcortical white matter have normalized on today's exam when correlated with 01/28/2015 MR.  No hemorrhage, mass lesion, hydrocephalus, or extra-axial fluid.  Generalized severe atrophy. Extensive small vessel disease with multiple remote lacunar infarcts. No visible vascular occlusion. No chronic hemorrhage. No acute findings in the orbits or paranasal sinuses. BILATERAL cataract extraction.  There is significant fluid accumulation throughout the LEFT middle ear and mastoid. This appearance was noted previously and is persistent. Correlate clinically for otomastoiditis.  IMPRESSION: New nonhemorrhagic tubular shaped area of restricted diffusion RIGHT frontal cortex and subcortical white matter, consistent with an acute perforating vessel infarct, RIGHT MCA territory.  Normalization of the previous areas of acute infarctions demonstrated in June.  Generalized severe atrophy and extensive small vessel disease.  Moderately extensive LEFT middle ear and mastoid fluid is asymmetric  and persistent. Correlate clinically for otomastoiditis.   Electronically Signed   By: Elsie Stain M.D.   On: 03/29/2015 14:04   Dg Chest Port 1 View  03/29/2015   CLINICAL DATA:  Patient with acute ischemic stroke. Shortness of breath.  EXAM: PORTABLE CHEST - 1 VIEW  COMPARISON:  Chest radiograph 03/29/2015  FINDINGS: Monitoring leads overlie the patient. Stable cardiac and mediastinal contours. No consolidative pulmonary opacities. Left lateral lower hemi thorax excluded from view. No pleural effusion or pneumothorax.  IMPRESSION: No acute cardiopulmonary process.   Electronically Signed   By: Annia Belt M.D.   On: 03/29/2015 17:39   Dg Chest Portable 1 View  03/29/2015   CLINICAL DATA:  Weakness nausea headache history of hypertension and stroke  EXAM: PORTABLE CHEST - 1 VIEW  COMPARISON:  01/28/2015  FINDINGS: Not cardiac enlargement stable. Vascular pattern normal. Mild bibasilar infiltrates most consistent with atelectasis. No consolidation or effusion.  IMPRESSION: Mild bilateral lower lobe atelectasis.   Electronically Signed   By: Esperanza Heir M.D.   On: 03/29/2015 09:24   Mr Maxine Glenn Head/brain Wo Cm  03/29/2015   CLINICAL DATA:  Acute infarct.  EXAM: MRA HEAD WITHOUT CONTRAST  TECHNIQUE: Angiographic images of the Circle of Willis were obtained using MRA technique without intravenous contrast.  COMPARISON:  01/29/2015  FINDINGS: Re- demonstrated extensive left and small right mastoid effusion with symmetric clear nasopharynx.  Intermittent motion degradation, especially at the cranial margin of this scan.  Symmetric carotid circulation. There is no major branch occlusion or new stenosis. Asymmetric mild narrowing of the distal left M1 segment is at least partly due to early branching of the anterior temporal. There is scattered luminal narrowing and irregularity in the  M3 vessels compatible with atherosclerosis. There is a 2 mm outpouching from the supraclinoid left ICA directed posteriorly and  inferiorly with a conical shape. An emanating vessel is not visible by MRA.  Strongly dominant right vertebral artery with chronic occlusion of the left V4 segment. The early branching left PICA has continued robust flow. Good flow also present in the right high PICA. Widely patent basilar artery. There is a chronic moderate to high-grade stenosis of the right anterior P2 segment and high-grade stenoses of bilateral posterior P2 segments, just after the bifurcation on the left and near the P3 segment junction on the right  No posterior circulation aneurysm.  IMPRESSION: 1. No acute arterial finding or change from 01/29/2015. 2. Chronic left V4 segment occlusion with patent early branching left PICA. 3. Chronic bilateral PCA distribution moderate to advanced stenoses. 4. 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   Electronically Signed   By: Marnee Spring M.D.   On: 03/29/2015 17:38         Subjective: Patient denies fevers, chills, headache, chest pain, dyspnea, nausea, vomiting, diarrhea, abdominal pain, dysuria, hematuria   Objective: Filed Vitals:   03/30/15 0002 03/30/15 0200 03/30/15 0400 03/30/15 1038  BP: 159/67 140/57 140/57 98/48  Pulse: 77 71 67 68  Temp: 97.5 F (36.4 C) 97.7 F (36.5 C) 97.7 F (36.5 C) 98 F (36.7 C)  TempSrc: Oral Oral Oral Oral  Resp:      Height:      Weight:      SpO2: 92% 90% 93% 99%    Intake/Output Summary (Last 24 hours) at 03/30/15 1152 Last data filed at 03/30/15 0706  Gross per 24 hour  Intake   1500 ml  Output    675 ml  Net    825 ml   Weight change:  Exam:   General:  Pt is alert, follows commands appropriately, not in acute distress  HEENT: No icterus, No thrush, No neck mass, [place]/AT  Cardiovascular: RRR, S1/S2, no rubs, no gallops  Respiratory: CTA bilaterally, no wheezing, no crackles, no rhonchi  Abdomen: Soft/+BS, non tender, non distended, no guarding  Extremities: No edema, No lymphangitis, No petechiae, No  rashes, no synovitis  Data Reviewed: Basic Metabolic Panel:  Recent Labs Lab 03/29/15 0909  NA 138  K 4.0  CL 103  CO2 23  GLUCOSE 98  BUN 11  CREATININE 1.08  CALCIUM 9.5   Liver Function Tests:  Recent Labs Lab 03/29/15 0909  AST 26  ALT 13*  ALKPHOS 79  BILITOT 0.6  PROT 7.1  ALBUMIN 3.9   No results for input(s): LIPASE, AMYLASE in the last 168 hours. No results for input(s): AMMONIA in the last 168 hours. CBC:  Recent Labs Lab 03/29/15 0909  WBC 6.8  NEUTROABS 3.4  HGB 13.4  HCT 42.1  MCV 82.2  PLT 299   Cardiac Enzymes:  Recent Labs Lab 03/29/15 0909  TROPONINI <0.03   BNP: Invalid input(s): POCBNP CBG: No results for input(s): GLUCAP in the last 168 hours.  No results found for this or any previous visit (from the past 240 hour(s)).   Scheduled Meds: .  stroke: mapping our early stages of recovery book   Does not apply Once  . aspirin EC  81 mg Oral QPM  . clopidogrel  75 mg Oral Daily  . enoxaparin (LOVENOX) injection  40 mg Subcutaneous Q24H  . famotidine  20 mg Oral BID  . gabapentin  300 mg Oral  BID  . gemfibrozil  600 mg Oral BID  . loratadine  10 mg Oral Daily  . multivitamin-lutein   Oral Daily  . pantoprazole  40 mg Oral BID AC  . sucralfate  1 g Oral QHS   Continuous Infusions:    Antanette Richwine, DO  [facility] Pager [phone]  If 7PM-7AM, please contact night-coverage [email or web address] Password [email or web address] 03/30/2015, 11:52 AM   LOS: 1 day

## 2015-03-30 NOTE — Evaluation (Signed)
Physical Therapy Evaluation Patient Details Name: [name] MRN: [id number] DOB: 1926-03-01 Today's Date: 03/30/2015   History of Present Illness  Patient is a 81 y/o male w/ hx of HTN, HLD, COPD, CVA ~1 month ago (01/2015) and von Willebrand's disease presents with dizziness and gait instability. MRI - + infarct in right frontal cortex and subcortical white matter. CT angio from prior-occluded left vertebral artery at the level of the foramen magnum with dominant right vert with no stenosis. Workup pending.   Clinical Impression  Patient presents with dull headache and mild balance deficits impacting safe mobility. Pt very active and independent PTA- mowing lawn. Pt describes dizzy episodes at home that last a few seconds. Pt does have a hx of fluid in ear and occluded left vertebral artery which can be causing these symptoms. Would benefit from skilled PT to maximize independence and mobility prior to return home. If pt's balance does not improve, may require OPPT.    Follow Up Recommendations No PT follow up;Supervision - Intermittent (might need OPPT pending improvement.)    Equipment Recommendations  None recommended by PT    Recommendations for Other Services       Precautions / Restrictions Precautions Precautions: Fall Restrictions Weight Bearing Restrictions: No      Mobility  Bed Mobility Overal bed mobility: Modified Independent                Transfers Overall transfer level: Needs assistance Equipment used: None Transfers: Sit to/from Stand Sit to Stand: Min guard         General transfer comment: Min guard for stability/safety. BLEs stabilizing on bed for support.   Ambulation/Gait Ambulation/Gait assistance: Min assist Ambulation Distance (Feet): 100 Feet (x2 bouts) Assistive device: None Gait Pattern/deviations: Step-through pattern;Staggering left;Staggering right;Decreased stride length   Gait velocity interpretation: <1.8 ft/sec, indicative of  risk for recurrent falls General Gait Details: Mild unsteadiness noted during gait with staggering right/eft. Grabbing rail for support in hallway. Decreased BP prior to gait 93/48. No dizziness.   Stairs            Wheelchair Mobility    Modified Rankin (Stroke Patients Only) Modified Rankin (Stroke Patients Only) Pre-Morbid Rankin Score: No symptoms Modified Rankin: Moderately severe disability     Balance Overall balance assessment: Needs assistance Sitting-balance support: Feet supported;No upper extremity supported Sitting balance-Leahy Scale: Good     Standing balance support: During functional activity Standing balance-Leahy Scale: Fair Standing balance comment: Able to perform static standing - urination in bathroom without support. Min A for gait.                             Pertinent Vitals/Pain Pain Assessment: Faces Faces Pain Scale: Hurts even more Pain Location: "my head" Pain Descriptors / Indicators: Pressure;Aching Pain Intervention(s): Monitored during session;Repositioned    Home Living Family/patient expects to be discharged to:: Private residence Living Arrangements: Spouse/significant other   Type of Home: House Home Access: Stairs to enter Entrance Stairs-Rails: None Entrance Stairs-Number of Steps: 1 Home Layout: One level Home Equipment: Environmental consultant - 2 wheels;Wheelchair - Manufacturing systems engineer - built in      Prior Function Level of Independence: Independent         Comments: Pt very active PTA- Geophysical data processor.     Hand Dominance   Dominant Hand: Right    Extremity/Trunk Assessment   Upper Extremity Assessment: Defer to OT evaluation  Lower Extremity Assessment: Generalized weakness;RLE deficits/detail;LLE deficits/detail (Grossly ~3+ to 4/5 throughout)         Communication   Communication: HOH  Cognition Arousal/Alertness: Awake/alert Behavior During Therapy:  (frustrated that the  doctors "arent fixing his head, that is where the problem is at") Overall Cognitive Status: Within Functional Limits for tasks assessed ("pretty much" at baseline per family.)                      General Comments General comments (skin integrity, edema, etc.): Pt reports episodes of dizziness at home when turning over in bed or standing up quickly. Has hx of fluid in left ear. Might be some concern for hypofunction?    Exercises        Assessment/Plan    PT Assessment Patient needs continued PT services  PT Diagnosis Abnormality of gait;Difficulty walking   PT Problem List Pain;Decreased activity tolerance;Decreased balance;Decreased mobility  PT Treatment Interventions Balance training;Gait training;Functional mobility training;Therapeutic activities;Therapeutic exercise;Patient/family education;Stair training;Neuromuscular re-education   PT Goals (Current goals can be found in the Care Plan section) Acute Rehab PT Goals Patient Stated Goal: find out what is causing my head to hurt PT Goal Formulation: With patient Time For Goal Achievement: 04/13/15 Potential to Achieve Goals: Good    Frequency Min 4X/week   Barriers to discharge        Co-evaluation               End of Session Equipment Utilized During Treatment: Gait belt Activity Tolerance: Patient tolerated treatment well Patient left: in bed;with call bell/phone within reach;with bed alarm set;with family/visitor present Nurse Communication: Mobility status         Time: 1034-1101 PT Time Calculation (min) (ACUTE ONLY): 27 min   Charges:   PT Evaluation $Initial PT Evaluation Tier I: 1 Procedure PT Treatments $Gait Training: 8-22 mins   PT G Codes:        Anuar Walgren A Zhanna Melin 03/30/2015, 11:17 AM  Mylo Red, PT, DPT [phone]

## 2015-03-30 NOTE — Progress Notes (Signed)
STROKE TEAM PROGRESS NOTE   HISTORY [name] is an 81 y.o. male history of prior infarct (01/2015), HTN, HLD presenting with dizziness, light headed sensation and a dull headache since yesterday. He reports feeling fine yesterday, was mowing the lawn. Last night felt light headed. Woke up this morning and had difficulty walking due to gait instability. He currently denies any focal weakness, sensory, speech or visual changes.   MRI brain completed and imaging reviewed. Shows a new infarct in the right frontal cortex and subcortical white matter. CT angio from prior admission shows occluded left vertebral artery at the level of the foramen magnum with dominant right vert with no stenosis. Bilateral carotid with < 50% stenosis.   Date last known well: 03/28/2015 Time last known well: 2000 tPA Given: no, outside tPA window Modified Rankin: Rankin Score=0  He was admitted for further evaluation and treatment.   SUBJECTIVE (INTERVAL HISTORY) His wife, son and daughter-in-law are at the bedside.  Overall he feels his condition is stable. He has been up walking with therapy and doing well.   OBJECTIVE Temp:  [97.5 F (36.4 C)-98.4 F (36.9 C)] 98 F (36.7 C) (08/08 1038) Pulse Rate:  [59-94] 68 (08/08 1038) Cardiac Rhythm:  [-] Normal sinus rhythm (08/08 0807) Resp:  [11-26] 16 (08/07 1925) BP: (98-200)/(48-86) 98/48 mmHg (08/08 1038) SpO2:  [90 %-100 %] 99 % (08/08 1038) Weight:  [74.753 kg (164 lb 12.8 oz)] 74.753 kg (164 lb 12.8 oz) (08/07 1630)  No results for input(s): GLUCAP in the last 168 hours.  Recent Labs Lab 03/29/15 0909  NA 138  K 4.0  CL 103  CO2 23  GLUCOSE 98  BUN 11  CREATININE 1.08  CALCIUM 9.5    Recent Labs Lab 03/29/15 0909  AST 26  ALT 13*  ALKPHOS 79  BILITOT 0.6  PROT 7.1  ALBUMIN 3.9    Recent Labs Lab 03/29/15 0909  WBC 6.8  NEUTROABS 3.4  HGB 13.4  HCT 42.1  MCV 82.2  PLT 299    Recent Labs Lab 03/29/15 0909  TROPONINI  <0.03   No results for input(s): LABPROT, INR in the last 72 hours.  Recent Labs  03/29/15 1718  COLORURINE YELLOW  LABSPEC 1.007  PHURINE 6.0  GLUCOSEU NEGATIVE  HGBUR NEGATIVE  BILIRUBINUR NEGATIVE  KETONESUR NEGATIVE  PROTEINUR NEGATIVE  UROBILINOGEN 0.2  NITRITE NEGATIVE  LEUKOCYTESUR NEGATIVE       Component Value Date/Time   CHOL 139 03/30/2015 0750   TRIG 90 03/30/2015 0750   HDL 37* 03/30/2015 0750   CHOLHDL 3.8 03/30/2015 0750   VLDL 18 03/30/2015 0750   LDLCALC 84 03/30/2015 0750   Lab Results  Component Value Date   HGBA1C 6.1* 01/29/2015   No results found for: LABOPIA, COCAINSCRNUR, LABBENZ, AMPHETMU, THCU, LABBARB  No results for input(s): ETH in the last 168 hours.  Ct Head Wo Contrast 03/29/2015    1. No acute intracranial abnormalities. 2. Age related volume loss, chronic microvascular ischemic change and 2 small white matter lacune infarcts. Opacification of left mastoid air cells and a portion of the left middle ear cavity, stable the prior CT.     Mr Brain Wo Contrast 03/29/2015    New nonhemorrhagic tubular shaped area of restricted diffusion RIGHT frontal cortex and subcortical white matter, consistent with an acute perforating vessel infarct, RIGHT MCA territory.  Normalization of the previous areas of acute infarctions demonstrated in June.  Generalized severe atrophy and extensive small vessel  disease.  Moderately extensive LEFT middle ear and mastoid fluid is asymmetric and persistent. Correlate clinically for otomastoiditis.     Dg Chest Port 1 View 03/29/2015   No acute cardiopulmonary process.    03/29/2015    Mild bilateral lower lobe atelectasis.     Mr Maxine Glenn Head/brain Wo Cm 03/29/2015    1. No acute arterial finding or change from 01/29/2015. 2. Chronic left V4 segment occlusion with patent early branching left PICA. 3. Chronic bilateral PCA distribution moderate to advanced stenoses. 4. 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       PHYSICAL EXAM Frail elderly male not in distress. . Afebrile. Head is nontraumatic. Neck is supple without bruit.    Cardiac exam no murmur or gallop. Lungs are clear to auscultation. Distal pulses are well felt. Neurological Exam :  Awake alert oriented 3. Normal speech and language. No dysarthria, aphasia or apraxia. Extraocular movements are pharyngeal or nystagmus. Face is symmetric without weakness. Fundi were not visualized. Vision acuity seems adequate. Visual field tests show no deficits. Tongue midline. Motor system exam no upper or lower extremity drift. Slightly diminished fine finger movements on the left. Orbits right over left approximately. Sensation is preserved bilaterally deep tendon reflexes are symmetric. Plantars are downgoing. Gait was not tested. ASSESSMENT/PLAN [name] is a 81 y.o. male with history of Barrett's esophagus, GERD, diverticulitis, hypertension, hyperlipidemia, GI AVMs, macular degeneration, and stroke in June 2016 presenting with dizziness, gait instability and fatigue. He did not receive IV t-PA due to delay in arrival.   Stroke:  Non-dominant right frontal infarct secondary to small vessel disease source in setting of dehydration Known L VA occlusion  MRI  Small R frontal infarct  MRA  Posterior circulation atherosclerosis (not related to current stroke) CT angiogram 01/29/2015 of the head and neck reveals left vertebral artery occlusion   No need to repeat Carotid Doppler or 2D Echo  LDL 84  HgbA1c 6.1 in June  Lovenox 40 mg sq daily for VTE prophylaxis Diet Heart Room service appropriate?: Yes; Fluid consistency:: Thin  aspirin 81 mg orally every day and clopidogrel 75 mg orally every day prior to admission. Was placed on both given intracranial atherosclerosis x 3 months. Will continue both aspirin 81 mg orally every day and clopidogrel 75 mg orally every day until 3 months are completed then plavix alone  Patient counseled to be  compliant with his antithrombotic medications  Ongoing aggressive stroke risk factor management  Patient counseled to stay hydrated, especially during the summer months. Family reports he does not do well with drinking fluids.   Possibly consider for Stroke AF. Dr. Pearlean Brownie will follow up on this either today or at time of followup with Dr. Lucia Gaskins.  Therapy recommendations:  No PT  Disposition:  Return home  [facility] for discharge from stroke standpoint  Keep follow up appt already scheduled with Dr. Lucia Gaskins on 8/11  Nothing further to add  Stroke team will sign off  Accelerated Hypertension  BP 195/64  No stable  Hyperlipidemia  Home meds:  Lopids, intolerant to statins  LDL 84, goal < 70  On (PCSK9) injections as an OP  Impaired glucose tolerance  HgbA1c 6.1 in June, at goal < 7.0  Other Stroke Risk Factors  Former Cigarette smoker  Hx stroke/TIA - right precentral gyrus infarct June 2016 d/t small vessel disease   Other Active Problems  GERD with hx of Barrett's esophagus  Dizziness  Intestinal AVMs  Other Pertinent History  macular degeneration  Hospital day # 1  BIBY,SHARON  [facility] See Amion for Pager information 03/30/2015 1:54 PM  I have personally examined this patient, reviewed notes, independently viewed imaging studies, participated in medical decision making and plan of care. I have made any additions or clarifications directly to the above note. Agree with note above. He has presented with. dizziness and gait ataxia due to to  small right frontal lacunar infarct from small vessel disease and  remains at risk for neurological worsening, recurrent stroke/TIAs and needs ongoing stroke evaluation and aggressive risk factor modification. Continue aspirin and Plavix given recent left vertebral artery occlusion and ongoing aggressive risk factor modification. Discussed with patient and multiple family members and answered questions.  Delia Heady, MD Medical Director [facility] Pager: [phone] 03/30/2015 3:32 PM    To contact Stroke Continuity provider, please refer to [email or web address]. After hours, contact General Neurology

## 2015-03-31 ENCOUNTER — Telehealth: Payer: Self-pay | Admitting: *Deleted

## 2015-03-31 LAB — HEMOGLOBIN A1C
Hgb A1c MFr Bld: 6 % — ABNORMAL HIGH (ref 4.8–5.6)
Mean Plasma Glucose: 126 mg/dL

## 2015-03-31 LAB — URINE CULTURE: Culture: 8000

## 2015-03-31 NOTE — Telephone Encounter (Signed)
Transition Care Management Follow-up Telephone Call   Date discharged? 03/30/15   How have you been since you were released from the hospital? Patient is doing well.   Do you understand why you were in the hospital? yes   Do you understand the discharge instructions? yes   Where were you discharged to? Home   Items Reviewed:  Medications reviewed: yes  Allergies reviewed: yes  Dietary changes reviewed: no  Referrals reviewed: yes   Functional Questionnaire:   Activities of Daily Living (ADLs):   He states they are independent in the following: bathing, dressing, eating, continence   States they require assistance with the following: ambulation   Any transportation issues/concerns?: no   Any patient concerns? no   Confirmed importance and date/time of follow-up visits scheduled yes, 04/06/15 at 1500  Provider Appointment booked with  Confirmed with patient if condition begins to worsen call PCP or go to the ER.  Patient was given the office number and encouraged to call back with question or concerns.  : yes

## 2015-04-01 ENCOUNTER — Telehealth: Payer: Self-pay | Admitting: *Deleted

## 2015-04-01 NOTE — Telephone Encounter (Signed)
Spoke w/ pt wife and they are going to come to see Dr. Roda Shutters for 1:30pm appt and check in at 1:00pm instead of with Dr. Lucia Gaskins. She verbalized this was okay per pt.

## 2015-04-02 ENCOUNTER — Ambulatory Visit: Payer: Medicare HMO | Admitting: Neurology

## 2015-04-02 ENCOUNTER — Encounter: Payer: Self-pay | Admitting: Neurology

## 2015-04-02 ENCOUNTER — Ambulatory Visit (INDEPENDENT_AMBULATORY_CARE_PROVIDER_SITE_OTHER): Payer: Medicare HMO | Admitting: Neurology

## 2015-04-02 VITALS — BP 173/78 | HR 67 | Ht 69.0 in | Wt 159.4 lb

## 2015-04-02 DIAGNOSIS — I6350 Cerebral infarction due to unspecified occlusion or stenosis of unspecified cerebral artery: Secondary | ICD-10-CM

## 2015-04-02 DIAGNOSIS — R002 Palpitations: Secondary | ICD-10-CM | POA: Diagnosis not present

## 2015-04-02 DIAGNOSIS — I10 Essential (primary) hypertension: Secondary | ICD-10-CM

## 2015-04-02 DIAGNOSIS — I639 Cerebral infarction, unspecified: Secondary | ICD-10-CM

## 2015-04-02 DIAGNOSIS — E785 Hyperlipidemia, unspecified: Secondary | ICD-10-CM

## 2015-04-02 NOTE — Patient Instructions (Addendum)
-   continue ASA and plavix for stroke prevention. - continue shot for lowering your cholesterol - Follow up with your primary care physician for stroke risk factor modification. Recommend maintain blood pressure goal <130/80, diabetes with hemoglobin A1c goal below 6.5% and lipids with LDL cholesterol goal below 70 mg/dL.  - avoid low BP and keep hydrated - 30 day cardia event monitoring to rule out afib - check BP and glucose at home - watch for blood stool - follow up in 2 months

## 2015-04-03 DIAGNOSIS — I6350 Cerebral infarction due to unspecified occlusion or stenosis of unspecified cerebral artery: Secondary | ICD-10-CM | POA: Insufficient documentation

## 2015-04-03 DIAGNOSIS — R002 Palpitations: Secondary | ICD-10-CM | POA: Insufficient documentation

## 2015-04-03 DIAGNOSIS — E785 Hyperlipidemia, unspecified: Secondary | ICD-10-CM | POA: Insufficient documentation

## 2015-04-03 NOTE — Progress Notes (Signed)
STROKE NEUROLOGY FOLLOW UP NOTE  NAME: MAC Fields DOB: 1925/11/10  REASON FOR VISIT: stroke follow up HISTORY FROM: pt and chart  Today we had the pleasure of seeing [name] in follow-up at our Neurology Clinic. Pt was accompanied by wife.   History Summary [name] is a 81 y.o. male with history of hypertension, hyperlipidemia, COPD, diverticulitis, and von Willebrand's disease was admitted on 01/28/15 for dizziness and gait instability. MRI showed acute left frontal MCA/ACA punctate infarcts, as well as chronic right CR and left cerebellum. MRA head and CTA neck showed diffuse intracranial athero and left VA occlusion. Stroke work up showed normal 2D echo, but LDL 176. He was started on dural antiplatelet and recommend outpt PCSK9 inhibitors as he is not tolerating statins.  He was discharged home with home health.  However, he was admitted again on 03/29/15 for dizziness, lightheadedness and gait instability, almost same as last time. MRI showed new acute infarct at right frontal cortex and subcortical WM. LDL down to 84. Stroke etiology still consistent with diffuse athero and ? Dehydration. He was continued on dural antiplatelet.   Interval History During the interval time, the patient has been doing well. No more stroke like symptoms since last discharge. He stated that he is compliant with medications and no missing does. No palpitation of heart. Had occasions that after bowel movement, had small amount of blood on tissue paper when wiping out buttock. Complaining of hard stool but denies dehydration. BP in clinic was high at 173/78.   REVIEW OF SYSTEMS: Full 14 system review of systems performed and notable only for those listed below and in HPI above, all others are negative:  Constitutional:   Cardiovascular:  Ear/Nose/Throat:  Hearing loss Skin:  Eyes:   Respiratory:   Gastroitestinal:   Genitourinary:  Hematology/Lymphatic:  Bleeding easily Endocrine:    Musculoskeletal:   Allergy/Immunology:  Frequent infections Neurological:  dizziness Psychiatric:  Sleep: restless leg  The following represents the patient's updated allergies and side effects list: Allergies  Allergen Reactions  . Atorvastatin     REACTION: muscles tense up   . Codeine Nausea And Vomiting  . Lisinopril Swelling    Lips and face swelling  . Statins Other (See Comments)    Muscle tense up    The neurologically relevant items on the patient's problem list were reviewed on today's visit.  Neurologic Examination  A problem focused neurological exam (12 or more points of the single system neurologic examination, vital signs counts as 1 point, cranial nerves count for 8 points) was performed.  Blood pressure 173/78, pulse 67, height  (1.753 m), weight 159 lb 6.4 oz (72.303 kg).  General - Well nourished, well developed, in no apparent distress.  Ophthalmologic - Sharp disc margins OU. Fundi not visualized due to .  Cardiovascular - Regular rate and rhythm with no murmur.  Mental Status -  Level of arousal and orientation to time, place, and person were intact. Language including expression, naming, repetition, comprehension was assessed and found intact. Fund of Knowledge was assessed and was intact.  Cranial Nerves II - XII - II - Visual field intact OU. III, IV, VI - Extraocular movements intact. V - Facial sensation intact bilaterally. VII - Facial movement intact bilaterally. VIII - Hard of hearing & vestibular intact bilaterally. X - Palate elevates symmetrically. XI - Chin turning & shoulder shrug intact bilaterally. XII - Tongue protrusion intact.  Motor Strength - The patient's  strength was normal in all extremities and pronator drift was absent.  Bulk was normal and fasciculations were absent.   Motor Tone - Muscle tone was assessed at the neck and appendages and was normal.  Reflexes - The patient's reflexes were 1+ in all extremities and  he had no pathological reflexes.  Sensory - Light touch, temperature/pinprick were assessed and were normal.    Coordination - The patient had normal movements in the hands and feet with no ataxia or dysmetria.  Tremor was absent.  Gait and Station - slow and small stride, but no tendency to fall.  Data reviewed: I personally reviewed the images and agree with the radiology interpretations.  Dg Chest 2 View 01/29/2015 No active cardiopulmonary disease.   Martin Brain Wo Contrast 01/28/2015 1. Punctate nonhemorrhagic white matter infarct deep to the right post central gyrus. A second punctate adjacent white matter infarct may be present as well. 2. Moderate generalized white matter disease bilaterally suggesting chronic microvascular ischemia. 3. Remote lacunar infarcts within the right coronal radiata and left cerebellum.   Martin Maxine Glenn Head/brain Wo Cm 01/29/2015 LEFT posterior-inferior cerebellar artery predominately terminates in PICA, with occluded LEFT V4 segment. Moderate to severe stenosis of RIGHT P2, moderate stenosis proximal LEFT P3. Moderate stenosis LEFT M2 segment origin.   MRA 03/29/15 - 1. No acute arterial finding or change from 01/29/2015. 2. Chronic left V4 segment occlusion with patent early branching left PICA. 3. Chronic bilateral PCA distribution moderate to advanced stenoses. 4. 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  MRI 03/29/15 New nonhemorrhagic tubular shaped area of restricted diffusion RIGHT frontal cortex and subcortical white matter, consistent with an acute perforating vessel infarct, RIGHT MCA territory. Normalization of the previous areas of acute infarctions demonstrated in June. Generalized severe atrophy and extensive small vessel disease. Moderately extensive LEFT middle ear and mastoid fluid is asymmetric and persistent. Correlate clinically for otomastoiditis.  Ct Angio Neck W/cm &/or Wo/cm 01/29/2015 Moderate narrowing origin of the  left vertebral artery. Narrowing left vertebral artery at the C1-2 level. Occluded left vertebral artery at the level the foramen magnum (V4). Right vertebral artery is dominant with mild irregularity without high-grade stenosis. Plaque carotid bifurcation bilaterally with less than 50% diameter stenosis. Mild to slightly moderate narrowing proximal left common carotid artery. Cervical spondylotic changes most notable C4-5 thru C6-7 level. Complete opacification inferior left mastoid air cells. No obstructing lesion of the eustachian tube is noted. Fluid filled upper cervical esophagus. Right hilar adenopathy.   2D echo  - Left ventricle: The cavity size was normal. Wall thickness wasincreased in a pattern of mild LVH. The estimated ejectionfraction was 55%. Wall motion was normal; there were no regionalwall motion abnormalities. - Right ventricle: The cavity size was mildly dilated. Systolicfunction was mildly reduced. - Pulmonary arteries: PA peak pressure: 37 mm Hg (S). Impressions: No cardiac source of embolism was identified, butcannot be ruled out on the basis of this examination.  Assessment: As you may recall, he is a 81 y.o. Caucasian male with PMH of hypertension, hyperlipidemia, COPD, diverticulitis, and von Willebrand's disease was admitted on 01/28/15 for dizziness and gait instability. MRI showed acute left frontal MCA/ACA punctate infarcts, as well as chronic right CR and left cerebellum. MRA head and CTA neck showed diffuse intracranial athero and left VA occlusion. Stroke work up showed normal 2D echo, but LDL 176. He was started on dural antiplatelet and recommend outpt PCSK9 inhibitors as he is not tolerating statins.  He was discharged  home with home health. However, he was admitted again on 03/29/15 for similar symptoms. MRI showed new acute infarct at right frontal cortex and subcortical WM. LDL down to 84. Stroke etiology still consistent with diffuse athero and ? Dehydration.  He was continued on dural antiplatelet.   Plan:  - continue ASA and plavix for stroke prevention. - continue PCSK9 inhibitors for high LDL while allergic to statins - Follow up with your primary care physician for stroke risk factor modification. Recommend maintain blood pressure goal <130/80, diabetes with hemoglobin A1c goal below 6.5% and lipids with LDL cholesterol goal below 70 mg/dL.  - avoid low BP and keep hydrated - 30 day cardia event monitoring to rule out afib - check BP and glucose at home. If BP constantly high, she may need to PCP for meds adjustment. - RTC in 2 months  Orders Placed This Encounter  Procedures  . Cardiac event monitor    Standing Status: Future     Number of Occurrences:      Standing Expiration Date: 04/03/2016    Scheduling Instructions:     Request cardionet setup. Thank you.    Order Specific Question:  Where should this test be performed?    Answer:  [facility]    Meds ordered this encounter  Medications  . DISCONTD: ciprofloxacin (CIPRO) 500 MG tablet    Sig:   . DISCONTD: diltiazem (CARDIZEM CD) 180 MG 24 hr capsule    Sig:   . DISCONTD: furosemide (LASIX) 40 MG tablet    Sig:   . DISCONTD: potassium chloride SA (K-DUR,KLOR-CON) 20 MEQ tablet    Sig:     Patient Instructions  - continue ASA and plavix for stroke prevention. - continue shot for lowering your cholesterol - Follow up with your primary care physician for stroke risk factor modification. Recommend maintain blood pressure goal <130/80, diabetes with hemoglobin A1c goal below 6.5% and lipids with LDL cholesterol goal below 70 mg/dL.  - avoid low BP and keep hydrated - 30 day cardia event monitoring to rule out afib - check BP and glucose at home - watch for blood stool - follow up in 2 months    Marvel Plan, MD PhD [facility] [address], [address] [place], [place] [address] [phone]

## 2015-04-06 ENCOUNTER — Encounter: Payer: Self-pay | Admitting: Family Medicine

## 2015-04-06 ENCOUNTER — Ambulatory Visit (INDEPENDENT_AMBULATORY_CARE_PROVIDER_SITE_OTHER): Payer: Medicare HMO | Admitting: Family Medicine

## 2015-04-06 ENCOUNTER — Other Ambulatory Visit: Payer: Self-pay | Admitting: Neurology

## 2015-04-06 VITALS — BP 156/76 | HR 70 | Temp 98.5°F | Wt 162.0 lb

## 2015-04-06 DIAGNOSIS — I639 Cerebral infarction, unspecified: Secondary | ICD-10-CM

## 2015-04-06 DIAGNOSIS — I4891 Unspecified atrial fibrillation: Secondary | ICD-10-CM

## 2015-04-06 DIAGNOSIS — R002 Palpitations: Secondary | ICD-10-CM

## 2015-04-06 MED ORDER — ALIROCUMAB 150 MG/ML ~~LOC~~ SOSY: 150.0000 mg | PREFILLED_SYRINGE | SUBCUTANEOUS | Status: DC

## 2015-04-06 NOTE — Patient Instructions (Signed)
Go to the lab on the way out.  We'll contact you with your lab report. Give yourself another injection on Friday.  We'll work on getting the higher dose of the medicine to start on 04/24/15.  Take care.  Glad to see you.

## 2015-04-06 NOTE — Progress Notes (Signed)
Pre visit review using our clinic review tool, if applicable. No additional management support is needed unless otherwise documented below in the visit note.  To recap:  03/29/2015 MRI brain acute nonhemorrhagic infarct right frontal cortex  -03/29/15--MRA brain--chronic bilateral PCA stenosis, chronic left VA occlusion, no new acute findings since 01/29/15 -01/29/2015 CT angiogram of the head and neck reveals left vertebral artery occlusion  -Will not repeat carotid duplex  -01/30/2015 echocardiogram shows EF 55%, no intracardiac thrombi--will not repeat transthoracic echocardiogram unless neurology feels compelled  -Continue aspirin 81 mg daily and Plavix 75 mg daily  -PT--no PT follow up -01/29/2015 hemoglobin A1c 6.1----will not repeat at this time  -01/29/2015 LDL 176--patient takes Praluent every 14 days  -03/30/15--repeat LDL-->84-->continue Praluent--defer adding additional agent to neurology; pt intolerant to statins; continue lopid outpt -appreciate neurology followup -The case was discussed with the stroke team, Dr. Pearlean Brownie -Dr. Pearlean Brownie he felt that the patient's new infarct was likely secondary to small vessel disease in the setting of dehydration--he did not feel that any further workup was necessary at this time other than what has already been performed noted above -Neurology recommended continuing aspirin 81 mg daily with Plavix 75 mg until 3 months are completed, then Plavix alone -Stroke Team did not feel repeat echocardiogram and carotid duplex was necessary at this time as the patient recently had CT anigo of the neck on 01/29/2015 and echo on 01/30/15 -PT/OT--did not feel pt needed additional followup  Hospital course d/w pt. No residual sx at this point.  Has seen neuro in th meantime and has f/u for 30d monitor pending, to eval for AF.  D/w pt.  No known AF, no heart racing per patient.  No CP, not SOB, no focal neuro changes. D/w pt about lipids and inc in praluent.  He has  another injection of  scheduled for this week.  Has been given himself the shot at home every 14 days.  No ADE on med.  Due for repeat labs.  LDL still not at goal as inpatient, but improved.   Meds, vitals, and allergies reviewed.   ROS: See HPI.  Otherwise, noncontributory.  GEN: nad, alert and oriented, hard of hearing.  HEENT: mucous membranes moist NECK: supple w/o LA CV: rrr.  PULM: ctab, no inc wob ABD: soft, +bs EXT: no edema SKIN: no acute rash

## 2015-04-07 ENCOUNTER — Ambulatory Visit (INDEPENDENT_AMBULATORY_CARE_PROVIDER_SITE_OTHER): Payer: Medicare HMO

## 2015-04-07 DIAGNOSIS — I4891 Unspecified atrial fibrillation: Secondary | ICD-10-CM | POA: Diagnosis not present

## 2015-04-07 DIAGNOSIS — R002 Palpitations: Secondary | ICD-10-CM

## 2015-04-07 DIAGNOSIS — I6350 Cerebral infarction due to unspecified occlusion or stenosis of unspecified cerebral artery: Secondary | ICD-10-CM | POA: Diagnosis not present

## 2015-04-07 DIAGNOSIS — I639 Cerebral infarction, unspecified: Secondary | ICD-10-CM

## 2015-04-07 LAB — CBC WITH DIFFERENTIAL/PLATELET
Basophils Absolute: 0.1 10*3/uL (ref 0.0–0.1)
Basophils Relative: 0.5 % (ref 0.0–3.0)
Eosinophils Absolute: 0.3 10*3/uL (ref 0.0–0.7)
Eosinophils Relative: 2.6 % (ref 0.0–5.0)
HCT: 39.8 % (ref 39.0–52.0)
Hemoglobin: 12.8 g/dL — ABNORMAL LOW (ref 13.0–17.0)
Lymphocytes Relative: 18.1 % (ref 12.0–46.0)
Lymphs Abs: 2.2 10*3/uL (ref 0.7–4.0)
MCHC: 32.2 g/dL (ref 30.0–36.0)
MCV: 81.5 fl (ref 78.0–100.0)
Monocytes Absolute: 0.8 10*3/uL (ref 0.1–1.0)
Monocytes Relative: 6.4 % (ref 3.0–12.0)
Neutro Abs: 8.6 10*3/uL — ABNORMAL HIGH (ref 1.4–7.7)
Neutrophils Relative %: 72.4 % (ref 43.0–77.0)
Platelets: 285 10*3/uL (ref 150.0–400.0)
RBC: 4.88 Mil/uL (ref 4.22–5.81)
RDW: 14.4 % (ref 11.5–15.5)
WBC: 11.9 10*3/uL — ABNORMAL HIGH (ref 4.0–10.5)

## 2015-04-07 LAB — BASIC METABOLIC PANEL
BUN: 11 mg/dL (ref 6–23)
CO2: 27 mEq/L (ref 19–32)
Calcium: 9.5 mg/dL (ref 8.4–10.5)
Chloride: 104 mEq/L (ref 96–112)
Creatinine, Ser: 1.07 mg/dL (ref 0.40–1.50)
GFR: 69.41 mL/min (ref >60.00)
Glucose, Bld: 85 mg/dL (ref 70–99)
Potassium: 4.3 mEq/L (ref 3.5–5.1)
Sodium: 139 mEq/L (ref 135–145)

## 2015-04-07 NOTE — Assessment & Plan Note (Addendum)
At this point, I don't want to make him hypotensive, I didn't adjust his BP meds.  Will send for  of praluent, LDL still higher than goal but improved.  No ADE on med.  D/w pt.  He'll continue  this week in the meantime, while we get the higher dose approved.  No focal neuro sx at this point.  He agrees with plan.  >25 minutes spent in face to face time with patient, >50% spent in counselling or coordination of care.  See notes on labs.  I'll await outpatient cardiac monitoring for AF eval per neuro.

## 2015-04-08 ENCOUNTER — Other Ambulatory Visit: Payer: Self-pay | Admitting: Family Medicine

## 2015-04-08 DIAGNOSIS — E7800 Pure hypercholesterolemia, unspecified: Secondary | ICD-10-CM

## 2015-04-20 ENCOUNTER — Other Ambulatory Visit (INDEPENDENT_AMBULATORY_CARE_PROVIDER_SITE_OTHER): Payer: Medicare HMO

## 2015-04-20 DIAGNOSIS — E7800 Pure hypercholesterolemia, unspecified: Secondary | ICD-10-CM

## 2015-04-20 LAB — LIPID PANEL
Cholesterol: 139 mg/dL (ref 0–200)
HDL: 43.9 mg/dL (ref >39.00)
LDL Cholesterol: 74 mg/dL (ref 0–99)
NonHDL: 94.85
Total CHOL/HDL Ratio: 3
Triglycerides: 106 mg/dL (ref 0.0–149.0)
VLDL: 21.2 mg/dL (ref 0.0–40.0)

## 2015-04-20 LAB — COMPREHENSIVE METABOLIC PANEL
ALT: 12 U/L (ref 0–53)
AST: 20 U/L (ref 0–37)
Albumin: 4.3 g/dL (ref 3.5–5.2)
Alkaline Phosphatase: 71 U/L (ref 39–117)
BUN: 17 mg/dL (ref 6–23)
CO2: 27 mEq/L (ref 19–32)
Calcium: 9.6 mg/dL (ref 8.4–10.5)
Chloride: 105 mEq/L (ref 96–112)
Creatinine, Ser: 1.14 mg/dL (ref 0.40–1.50)
GFR: 64.51 mL/min (ref >60.00)
Glucose, Bld: 92 mg/dL (ref 70–99)
Potassium: 4.3 mEq/L (ref 3.5–5.1)
Sodium: 140 mEq/L (ref 135–145)
Total Bilirubin: 0.4 mg/dL (ref 0.2–1.2)
Total Protein: 7.4 g/dL (ref 6.0–8.3)

## 2015-04-21 ENCOUNTER — Other Ambulatory Visit: Payer: Self-pay | Admitting: Family Medicine

## 2015-04-21 DIAGNOSIS — E7800 Pure hypercholesterolemia, unspecified: Secondary | ICD-10-CM

## 2015-04-22 ENCOUNTER — Other Ambulatory Visit: Payer: Medicare HMO

## 2015-05-26 ENCOUNTER — Ambulatory Visit (INDEPENDENT_AMBULATORY_CARE_PROVIDER_SITE_OTHER): Payer: Medicare HMO

## 2015-05-26 DIAGNOSIS — Z23 Encounter for immunization: Secondary | ICD-10-CM

## 2015-06-01 ENCOUNTER — Telehealth: Payer: Self-pay | Admitting: *Deleted

## 2015-06-01 NOTE — Telephone Encounter (Signed)
Spoke w/ pt about 30 day cardiac monitoring did not show a-fib. Continue current treatment per Dr Roda Shutters. Asked him to make sure to fill out DPR form when they come in for appt. He verbalized understanding.

## 2015-06-01 NOTE — Telephone Encounter (Signed)
Called to speak to pt about results per Dr. Roda Shutters. Wife picked up. No DPR on file. Advised I cannot speak with her because of this. Offered to fax form, but they do not have fax machine. He has appt on 10.17 and I advised they can fill one out at this time. She is going to have husband call back about results. Gave [facility] phone number.

## 2015-06-01 NOTE — Telephone Encounter (Signed)
I called and gave the results below to pt and then to wife.  They both verbalized understanding.

## 2015-06-01 NOTE — Telephone Encounter (Signed)
-----   Message from Jindong Xu, MD sent at 05/29/2015  1:55 PM EDT ----- Hi, Katrina:  Please let patient know that his 30 day cardiac event monitoring did not show atrial fibrillation. Continue current treatment plan. Thanks  Jindong Xu, MD PhD Stroke Neurology 05/29/2015 1:55 PM   

## 2015-06-01 NOTE — Telephone Encounter (Signed)
-----   Message from Marvel Plan, MD sent at 05/29/2015  1:55 PM EDT ----- Hi, Katrina:  Please let patient know that his 30 day cardiac event monitoring did not show atrial fibrillation. Continue current treatment plan. Thanks  Marvel Plan, MD PhD Stroke Neurology 05/29/2015 1:55 PM

## 2015-06-08 ENCOUNTER — Ambulatory Visit (INDEPENDENT_AMBULATORY_CARE_PROVIDER_SITE_OTHER): Payer: Medicare HMO | Admitting: Neurology

## 2015-06-08 ENCOUNTER — Encounter: Payer: Self-pay | Admitting: Neurology

## 2015-06-08 VITALS — BP 148/67 | HR 70 | Ht 69.0 in | Wt 162.4 lb

## 2015-06-08 DIAGNOSIS — E785 Hyperlipidemia, unspecified: Secondary | ICD-10-CM | POA: Diagnosis not present

## 2015-06-08 DIAGNOSIS — I10 Essential (primary) hypertension: Secondary | ICD-10-CM

## 2015-06-08 DIAGNOSIS — I6350 Cerebral infarction due to unspecified occlusion or stenosis of unspecified cerebral artery: Secondary | ICD-10-CM | POA: Diagnosis not present

## 2015-06-08 DIAGNOSIS — I639 Cerebral infarction, unspecified: Secondary | ICD-10-CM

## 2015-06-08 NOTE — Progress Notes (Signed)
STROKE NEUROLOGY FOLLOW UP NOTE  NAME: [name] DOB: August 19, 1926  REASON FOR VISIT: stroke follow up HISTORY FROM: pt and chart  Today we had the pleasure of seeing Marisue Fields in follow-up at our Neurology Clinic. Pt was accompanied by wife.   History Summary [name] is a 81 y.o. male with history of hypertension, hyperlipidemia, COPD, diverticulitis, and von Willebrand's disease was admitted on 01/28/15 for dizziness and gait instability. MRI showed acute left frontal MCA/ACA punctate infarcts, as well as chronic right CR and left cerebellum. MRA head and CTA neck showed diffuse intracranial athero and left VA occlusion. Stroke work up showed normal 2D echo, but LDL 176. He was started on dural antiplatelet and recommend outpt PCSK9 inhibitors as he is not tolerating statins.  He was discharged home with home health.  However, he was admitted again on 03/29/15 for dizziness, lightheadedness and gait instability, almost same as last time. MRI showed new acute infarct at right frontal cortex and subcortical WM. LDL down to 84. Stroke etiology still consistent with diffuse athero and ? Dehydration. He was continued on dural antiplatelet.   04/03/15 follow up - the patient has been doing well. No more stroke like symptoms since last discharge. He stated that he is compliant with medications and no missing does. No palpitation of heart. Had occasions that after bowel movement, had small amount of blood on tissue paper when wiping out buttock. Complaining of hard stool but denies dehydration. BP in clinic was high at 173/78.   Interval History During the interval time, pt has been doing well. No recurrent symptoms. He had 30 day cardiac event monitoring did not show afib. He is continued on ASA and plavix. He has been on pralucent for LDL control. His BP today 148/67.   REVIEW OF SYSTEMS: Full 14 system review of systems performed and notable only for those listed below and in HPI above,  all others are negative:  Constitutional:   Cardiovascular:  Ear/Nose/Throat:  Hearing loss Skin:  Eyes:   Respiratory:   Gastroitestinal:  Rectal bleeding Genitourinary:  Hematology/Lymphatic:  Bleeding easily Endocrine:  Musculoskeletal:   Allergy/Immunology:   Neurological:   Psychiatric:  Sleep: restless leg  The following represents the patient's updated allergies and side effects list: Allergies  Allergen Reactions  . Atorvastatin     REACTION: muscles tense up   . Codeine Nausea And Vomiting  . Lisinopril Swelling    Lips and face swelling  . Statins Other (See Comments)    Muscle tense up    The neurologically relevant items on the patient's problem list were reviewed on today's visit.  Neurologic Examination  A problem focused neurological exam (12 or more points of the single system neurologic examination, vital signs counts as 1 point, cranial nerves count for 8 points) was performed.  Blood pressure 148/67, pulse 70, height  (1.753 m), weight 162 lb 6.4 oz (73.664 kg).  General - Well nourished, well developed, in no apparent distress.  Ophthalmologic - Sharp disc margins OU. Fundi not visualized due to .  Cardiovascular - Regular rate and rhythm with no murmur.  Mental Status -  Level of arousal and orientation to time, place, and person were intact. Language including expression, naming, repetition, comprehension was assessed and found intact. Fund of Knowledge was assessed and was intact.  Cranial Nerves II - XII - II - Visual field intact OU. III, IV, VI - Extraocular movements intact. V - Facial  sensation intact bilaterally. VII - Facial movement intact bilaterally. VIII - Hard of hearing & vestibular intact bilaterally. X - Palate elevates symmetrically. XI - Chin turning & shoulder shrug intact bilaterally. XII - Tongue protrusion intact.  Motor Strength - The patient's strength was normal in all extremities and pronator drift was  absent.  Bulk was normal and fasciculations were absent.   Motor Tone - Muscle tone was assessed at the neck and appendages and was normal.  Reflexes - The patient's reflexes were 1+ in all extremities and he had no pathological reflexes.  Sensory - Light touch, temperature/pinprick were assessed and were normal.    Coordination - The patient had normal movements in the hands and feet with no ataxia or dysmetria.  Tremor was absent.  Gait and Station - mild stooped posturing.  Data reviewed: I personally reviewed the images and agree with the radiology interpretations.  Dg Chest 2 View 01/29/2015 No active cardiopulmonary disease.   Mr Brain Wo Contrast 01/28/2015 1. Punctate nonhemorrhagic white matter infarct deep to the right post central gyrus. A second punctate adjacent white matter infarct may be present as well. 2. Moderate generalized white matter disease bilaterally suggesting chronic microvascular ischemia. 3. Remote lacunar infarcts within the right coronal radiata and left cerebellum.   Mr Maxine GlennMra Head/brain Wo Cm 01/29/2015 LEFT posterior-inferior cerebellar artery predominately terminates in PICA, with occluded LEFT V4 segment. Moderate to severe stenosis of RIGHT P2, moderate stenosis proximal LEFT P3. Moderate stenosis LEFT M2 segment origin.   MRA 03/29/15 - 1. No acute arterial finding or change from 01/29/2015. 2. Chronic left V4 segment occlusion with patent early branching left PICA. 3. Chronic bilateral PCA distribution moderate to advanced stenoses. 4. 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  MRI 03/29/15 New nonhemorrhagic tubular shaped area of restricted diffusion RIGHT frontal cortex and subcortical white matter, consistent with an acute perforating vessel infarct, RIGHT MCA territory. Normalization of the previous areas of acute infarctions demonstrated in June. Generalized severe atrophy and extensive small vessel disease. Moderately extensive  LEFT middle ear and mastoid fluid is asymmetric and persistent. Correlate clinically for otomastoiditis.  Ct Angio Neck W/cm &/or Wo/cm 01/29/2015 Moderate narrowing origin of the left vertebral artery. Narrowing left vertebral artery at the C1-2 level. Occluded left vertebral artery at the level the foramen magnum (V4). Right vertebral artery is dominant with mild irregularity without high-grade stenosis. Plaque carotid bifurcation bilaterally with less than 50% diameter stenosis. Mild to slightly moderate narrowing proximal left common carotid artery. Cervical spondylotic changes most notable C4-5 thru C6-7 level. Complete opacification inferior left mastoid air cells. No obstructing lesion of the eustachian tube is noted. Fluid filled upper cervical esophagus. Right hilar adenopathy.   2D echo  - Left ventricle: The cavity size was normal. Wall thickness wasincreased in a pattern of mild LVH. The estimated ejectionfraction was 55%. Wall motion was normal; there were no regionalwall motion abnormalities. - Right ventricle: The cavity size was mildly dilated. Systolicfunction was mildly reduced. - Pulmonary arteries: PA peak pressure: 37 mm Hg (S). Impressions: No cardiac source of embolism was identified, butcannot be ruled out on the basis of this examination.  30 day cardiac monitoring - no afib  Component     Latest Ref Rng 03/30/2015  Cholesterol     0 - 200 mg/dL [id number]  Triglycerides     <150 mg/dL 90  HDL Cholesterol     >40 mg/dL 37 (L)  Total CHOL/HDL Ratio  3.8  VLDL     0 - 40 mg/dL 18  LDL (calc)     0 - 99 mg/dL 84  Hemoglobin W0J     4.8 - 5.6 % 6.0 (H)  Mean Plasma Glucose      126    Assessment: As you may recall, he is a 81 y.o. Caucasian male with PMH of hypertension, hyperlipidemia, COPD, diverticulitis, and von Willebrand's disease was admitted on 01/28/15 for dizziness and gait instability. MRI showed acute left frontal MCA/ACA punctate infarcts, as  well as chronic right CR and left cerebellum. MRA head and CTA neck showed diffuse intracranial athero and left VA occlusion. Stroke work up showed normal 2D echo, but LDL 176. He was started on dural antiplatelet and recommend outpt PCSK9 inhibitors as he is not tolerating statins.  He was discharged home with home health. However, he was admitted again on 03/29/15 for similar symptoms. MRI showed new acute infarct at right frontal cortex and subcortical WM. LDL down to 84. Stroke etiology still consistent with diffuse athero and ? Dehydration. He was continued on dural antiplatelet and started on PCSK9 inhibitors. 30 day cardiac event monitoring negative.  Plan:  - continue ASA and plavix for total 3 months and then plavix alone for stroke prevention. - continue PCSK9 inhibitors for high LDL  - Follow up with your primary care physician for stroke risk factor modification. Recommend maintain blood pressure goal 120-150/70-90, diabetes with hemoglobin A1c goal below 6.5% and lipids with LDL cholesterol goal below 70 mg/dL.  - avoid low BP and keep hydrated - check BP at home. - RTC in 6 months  No orders of the defined types were placed in this encounter.    No orders of the defined types were placed in this encounter.    Patient Instructions  - continue ASA and plavix for another one month and then stop ASA and keep plavix for stroke prevention - continue injection for cholesterol management - Follow up with your primary care physician for stroke risk factor modification. Recommend maintain blood pressure goal 120-150/80,diabetes with hemoglobin A1c goal below 6.5% and lipids with LDL cholesterol goal below 70 mg/dL.  - check BP at home. - follow up in 6 months.    Marvel Plan, MD PhD [facility] [address] [place], [place] [address] [phone]

## 2015-06-08 NOTE — Patient Instructions (Signed)
-   continue ASA and plavix for another one month and then stop ASA and keep plavix for stroke prevention - continue injection for cholesterol management - Follow up with your primary care physician for stroke risk factor modification. Recommend maintain blood pressure goal 120-150/80,diabetes with hemoglobin A1c goal below 6.5% and lipids with LDL cholesterol goal below 70 mg/dL.  - check BP at home. - follow up in 6 months.

## 2015-06-12 ENCOUNTER — Other Ambulatory Visit (INDEPENDENT_AMBULATORY_CARE_PROVIDER_SITE_OTHER): Payer: Medicare HMO

## 2015-06-12 DIAGNOSIS — E7800 Pure hypercholesterolemia, unspecified: Secondary | ICD-10-CM | POA: Diagnosis not present

## 2015-06-12 LAB — COMPREHENSIVE METABOLIC PANEL
ALT: 11 U/L (ref 0–53)
AST: 22 U/L (ref 0–37)
Albumin: 4.2 g/dL (ref 3.5–5.2)
Alkaline Phosphatase: 67 U/L (ref 39–117)
BUN: 18 mg/dL (ref 6–23)
CO2: 26 mEq/L (ref 19–32)
Calcium: 9.6 mg/dL (ref 8.4–10.5)
Chloride: 105 mEq/L (ref 96–112)
Creatinine, Ser: 1.25 mg/dL (ref 0.40–1.50)
GFR: 57.98 mL/min — ABNORMAL LOW (ref >60.00)
Glucose, Bld: 100 mg/dL — ABNORMAL HIGH (ref 70–99)
Potassium: 4.3 mEq/L (ref 3.5–5.1)
Sodium: 140 mEq/L (ref 135–145)
Total Bilirubin: 0.4 mg/dL (ref 0.2–1.2)
Total Protein: 7.2 g/dL (ref 6.0–8.3)

## 2015-06-12 LAB — LIPID PANEL
Cholesterol: 150 mg/dL (ref 0–200)
HDL: 44.3 mg/dL (ref >39.00)
LDL Cholesterol: 83 mg/dL (ref 0–99)
NonHDL: 106.09
Total CHOL/HDL Ratio: 3
Triglycerides: 115 mg/dL (ref 0.0–149.0)
VLDL: 23 mg/dL (ref 0.0–40.0)

## 2015-07-23 ENCOUNTER — Ambulatory Visit (INDEPENDENT_AMBULATORY_CARE_PROVIDER_SITE_OTHER): Payer: Medicare HMO | Admitting: Family Medicine

## 2015-07-23 ENCOUNTER — Encounter: Payer: Self-pay | Admitting: Family Medicine

## 2015-07-23 VITALS — BP 144/64 | HR 67 | Temp 97.4°F | Wt 163.2 lb

## 2015-07-23 DIAGNOSIS — K5792 Diverticulitis of intestine, part unspecified, without perforation or abscess without bleeding: Secondary | ICD-10-CM | POA: Diagnosis not present

## 2015-07-23 DIAGNOSIS — R1030 Lower abdominal pain, unspecified: Secondary | ICD-10-CM

## 2015-07-23 LAB — CBC WITH DIFFERENTIAL/PLATELET
Basophils Absolute: 0 10*3/uL (ref 0.0–0.1)
Basophils Relative: 0.6 % (ref 0.0–3.0)
Eosinophils Absolute: 0.2 10*3/uL (ref 0.0–0.7)
Eosinophils Relative: 3.5 % (ref 0.0–5.0)
HCT: 37.4 % — ABNORMAL LOW (ref 39.0–52.0)
Hemoglobin: 11.8 g/dL — ABNORMAL LOW (ref 13.0–17.0)
Lymphocytes Relative: 31.6 % (ref 12.0–46.0)
Lymphs Abs: 1.8 10*3/uL (ref 0.7–4.0)
MCHC: 31.6 g/dL (ref 30.0–36.0)
MCV: 80.9 fl (ref 78.0–100.0)
Monocytes Absolute: 0.7 10*3/uL (ref 0.1–1.0)
Monocytes Relative: 11.9 % (ref 3.0–12.0)
Neutro Abs: 3 10*3/uL (ref 1.4–7.7)
Neutrophils Relative %: 52.4 % (ref 43.0–77.0)
Platelets: 268 10*3/uL (ref 150.0–400.0)
RBC: 4.63 Mil/uL (ref 4.22–5.81)
RDW: 15.2 % (ref 11.5–15.5)
WBC: 5.8 10*3/uL (ref 4.0–10.5)

## 2015-07-23 MED ORDER — METRONIDAZOLE 500 MG PO TABS: 500.0000 mg | ORAL_TABLET | Freq: Three times a day (TID) | ORAL | Status: DC

## 2015-07-23 MED ORDER — CIPROFLOXACIN HCL 500 MG PO TABS: 500.0000 mg | ORAL_TABLET | Freq: Two times a day (BID) | ORAL | Status: DC

## 2015-07-23 NOTE — Patient Instructions (Signed)
Go to the lab on the way out.  We'll contact you with your lab report. Soup/broth for lunch and dinner.  Coffee, water are okay.  No salads, bran, fiber for now.  Update me tomorrow.  If the pain gets worse, then start the antibiotics.   Take care.  Glad to see you.

## 2015-07-23 NOTE — Progress Notes (Signed)
Pre visit review using our clinic review tool, if applicable. No additional management support is needed unless otherwise documented below in the visit note.  He has some joint aches, not consistent.  He gets better the more he is active.  It isn't consistent, which argues against a med source/intolernace.   Know h/o diverticulitis, prev CT documented case in 07/2014, reviewed and d/w pt.  Recently with some abd pain, "and it was just like it was a year ago with the infection (ie diverticulitis)".  Started about 2 weeks ago.  Intermittent pain- clearly not constant.  No pain now.  No fevers.  No chills.  No diarrhea.  No blood in stool.  No rash.  Pain is happening daily, sometimes worse than others.  Not worse with eating, is possibly better with eating.  Lower abd sx, when they occur.  No dysuria.    PMH and SH reviewed  ROS: See HPI, otherwise noncontributory.  Meds, vitals, and allergies reviewed.   GEN: nad, alert and oriented HEENT: mucous membranes moist NECK: supple w/o LA CV: rrr.  PULM: ctab, no inc wob ABD: soft, +bs, not ttp currently, no rebound.  EXT: no edema

## 2015-07-23 NOTE — Assessment & Plan Note (Signed)
Concern for mild sx.  Wouldn't start abx or get CT at this point, neither is indicated since no pain currently.  No fevers, okay for outpatient f/u.  Check CBC, clear liquid diet for now, hold abx.  D/w pt.   He agrees.   If worsening, then start abx.  Update me tomorrow.   Well appearing.

## 2015-07-27 ENCOUNTER — Other Ambulatory Visit: Payer: Self-pay | Admitting: *Deleted

## 2015-07-27 MED ORDER — METOPROLOL SUCCINATE ER 25 MG PO TB24: 25.0000 mg | ORAL_TABLET | Freq: Every day | ORAL | Status: DC

## 2015-07-27 MED ORDER — RANITIDINE HCL 150 MG PO TABS: 150.0000 mg | ORAL_TABLET | Freq: Two times a day (BID) | ORAL | Status: DC

## 2015-08-12 ENCOUNTER — Other Ambulatory Visit: Payer: Self-pay | Admitting: *Deleted

## 2015-08-12 NOTE — Telephone Encounter (Signed)
Received refill request electronically See drug warning with Loperamide Is it okay to refill?

## 2015-08-13 MED ORDER — GEMFIBROZIL 600 MG PO TABS: 600.0000 mg | ORAL_TABLET | Freq: Two times a day (BID) | ORAL | Status: DC

## 2015-08-13 NOTE — Telephone Encounter (Signed)
Okay to take as the loperamide was only used prn.  Thanks.   Sent.

## 2015-08-14 ENCOUNTER — Encounter (HOSPITAL_COMMUNITY): Payer: Self-pay | Admitting: Emergency Medicine

## 2015-08-14 ENCOUNTER — Telehealth: Payer: Self-pay | Admitting: Family Medicine

## 2015-08-14 ENCOUNTER — Emergency Department (HOSPITAL_COMMUNITY)
Admission: EM | Admit: 2015-08-14 | Discharge: 2015-08-14 | Disposition: A | Discharge status: Home / Self Care | Payer: Medicare HMO | Attending: Emergency Medicine | Admitting: Emergency Medicine

## 2015-08-14 ENCOUNTER — Emergency Department (HOSPITAL_COMMUNITY): Payer: Medicare HMO

## 2015-08-14 DIAGNOSIS — Z79899 Other long term (current) drug therapy: Secondary | ICD-10-CM | POA: Diagnosis not present

## 2015-08-14 DIAGNOSIS — R531 Weakness: Secondary | ICD-10-CM | POA: Diagnosis present

## 2015-08-14 DIAGNOSIS — R197 Diarrhea, unspecified: Secondary | ICD-10-CM | POA: Diagnosis not present

## 2015-08-14 DIAGNOSIS — Z792 Long term (current) use of antibiotics: Secondary | ICD-10-CM | POA: Insufficient documentation

## 2015-08-14 DIAGNOSIS — Z7902 Long term (current) use of antithrombotics/antiplatelets: Secondary | ICD-10-CM | POA: Insufficient documentation

## 2015-08-14 DIAGNOSIS — R42 Dizziness and giddiness: Secondary | ICD-10-CM | POA: Insufficient documentation

## 2015-08-14 DIAGNOSIS — R1030 Lower abdominal pain, unspecified: Secondary | ICD-10-CM | POA: Diagnosis not present

## 2015-08-14 DIAGNOSIS — H9190 Unspecified hearing loss, unspecified ear: Secondary | ICD-10-CM | POA: Insufficient documentation

## 2015-08-14 DIAGNOSIS — Z87438 Personal history of other diseases of male genital organs: Secondary | ICD-10-CM | POA: Insufficient documentation

## 2015-08-14 DIAGNOSIS — Z7952 Long term (current) use of systemic steroids: Secondary | ICD-10-CM | POA: Diagnosis not present

## 2015-08-14 DIAGNOSIS — Z9889 Other specified postprocedural states: Secondary | ICD-10-CM | POA: Diagnosis not present

## 2015-08-14 DIAGNOSIS — E785 Hyperlipidemia, unspecified: Secondary | ICD-10-CM | POA: Diagnosis not present

## 2015-08-14 DIAGNOSIS — K219 Gastro-esophageal reflux disease without esophagitis: Secondary | ICD-10-CM | POA: Insufficient documentation

## 2015-08-14 DIAGNOSIS — R5383 Other fatigue: Secondary | ICD-10-CM | POA: Diagnosis not present

## 2015-08-14 DIAGNOSIS — Z862 Personal history of diseases of the blood and blood-forming organs and certain disorders involving the immune mechanism: Secondary | ICD-10-CM | POA: Insufficient documentation

## 2015-08-14 DIAGNOSIS — I10 Essential (primary) hypertension: Secondary | ICD-10-CM | POA: Diagnosis not present

## 2015-08-14 DIAGNOSIS — Z8711 Personal history of peptic ulcer disease: Secondary | ICD-10-CM | POA: Insufficient documentation

## 2015-08-14 DIAGNOSIS — Z87891 Personal history of nicotine dependence: Secondary | ICD-10-CM | POA: Insufficient documentation

## 2015-08-14 DIAGNOSIS — R05 Cough: Secondary | ICD-10-CM | POA: Diagnosis not present

## 2015-08-14 DIAGNOSIS — Z8673 Personal history of transient ischemic attack (TIA), and cerebral infarction without residual deficits: Secondary | ICD-10-CM | POA: Diagnosis not present

## 2015-08-14 LAB — CBC WITH DIFFERENTIAL/PLATELET
Basophils Absolute: 0 10*3/uL (ref 0.0–0.1)
Basophils Relative: 1 %
Eosinophils Absolute: 0.2 10*3/uL (ref 0.0–0.7)
Eosinophils Relative: 4 %
HCT: 41.4 % (ref 39.0–52.0)
Hemoglobin: 12.9 g/dL — ABNORMAL LOW (ref 13.0–17.0)
Lymphocytes Relative: 30 %
Lymphs Abs: 2 10*3/uL (ref 0.7–4.0)
MCH: 25.6 pg — ABNORMAL LOW (ref 26.0–34.0)
MCHC: 31.2 g/dL (ref 30.0–36.0)
MCV: 82.3 fL (ref 78.0–100.0)
Monocytes Absolute: 0.6 10*3/uL (ref 0.1–1.0)
Monocytes Relative: 10 %
Neutro Abs: 3.7 10*3/uL (ref 1.7–7.7)
Neutrophils Relative %: 55 %
Platelets: 325 10*3/uL (ref 150–400)
RBC: 5.03 MIL/uL (ref 4.22–5.81)
RDW: 14.1 % (ref 11.5–15.5)
WBC: 6.5 10*3/uL (ref 4.0–10.5)

## 2015-08-14 LAB — URINALYSIS, ROUTINE W REFLEX MICROSCOPIC
Bilirubin Urine: NEGATIVE
Glucose, UA: NEGATIVE mg/dL
Hgb urine dipstick: NEGATIVE
Ketones, ur: NEGATIVE mg/dL
Leukocytes, UA: NEGATIVE
Nitrite: NEGATIVE
Protein, ur: NEGATIVE mg/dL
Specific Gravity, Urine: 1.01 (ref 1.005–1.030)
pH: 5.5 (ref 5.0–8.0)

## 2015-08-14 LAB — COMPREHENSIVE METABOLIC PANEL
ALT: 14 U/L — ABNORMAL LOW (ref 17–63)
AST: 28 U/L (ref 15–41)
Albumin: 3.6 g/dL (ref 3.5–5.0)
Alkaline Phosphatase: 58 U/L (ref 38–126)
Anion gap: 9 (ref 5–15)
BUN: 13 mg/dL (ref 6–20)
CO2: 24 mmol/L (ref 22–32)
Calcium: 9.3 mg/dL (ref 8.9–10.3)
Chloride: 108 mmol/L (ref 101–111)
Creatinine, Ser: 1.13 mg/dL (ref 0.61–1.24)
GFR calc Af Amer: >60 mL/min (ref >60)
GFR calc non Af Amer: 56 mL/min — ABNORMAL LOW (ref >60)
Glucose, Bld: 95 mg/dL (ref 65–99)
Potassium: 4.5 mmol/L (ref 3.5–5.1)
Sodium: 141 mmol/L (ref 135–145)
Total Bilirubin: 0.6 mg/dL (ref 0.3–1.2)
Total Protein: 6.9 g/dL (ref 6.5–8.1)

## 2015-08-14 MED ORDER — IOHEXOL 300 MG/ML  SOLN
80.0000 mL | Freq: Once | INTRAMUSCULAR | Status: AC | PRN
  Administered 2015-08-14: 80 mL via INTRAVENOUS

## 2015-08-14 MED ORDER — SODIUM CHLORIDE 0.9 % IV BOLUS (SEPSIS)
500.0000 mL | Freq: Once | INTRAVENOUS | Status: AC
  Administered 2015-08-14: 500 mL via INTRAVENOUS

## 2015-08-14 NOTE — ED Notes (Signed)
Pt ambulated in the hallway with standby assistance with ease.

## 2015-08-14 NOTE — ED Notes (Signed)
Pt sts lower abd pain with some diarrhea and dizziness; per family pt on antibiotics for 3 weeks for diverticulitis and prostatitis; pt sts increased pain and dizziness x 2 days

## 2015-08-14 NOTE — Discharge Instructions (Signed)
You have a small abdominal aneurysm that needs routine follow up.  Discuss with your primary care provider.  Diarrhea Diarrhea is frequent loose and watery bowel movements. It can cause you to feel weak and dehydrated. Dehydration can cause you to become tired and thirsty, have a dry mouth, and have decreased urination that often is dark yellow. Diarrhea is a sign of another problem, most often an infection that will not last long. In most cases, diarrhea typically lasts 2-3 days. However, it can last longer if it is a sign of something more serious. It is important to treat your diarrhea as directed by your caregiver to lessen or prevent future episodes of diarrhea. CAUSES  Some common causes include:  Gastrointestinal infections caused by viruses, bacteria, or parasites.  Food poisoning or food allergies.  Certain medicines, such as antibiotics, chemotherapy, and laxatives.  Artificial sweeteners and fructose.  Digestive disorders. HOME CARE INSTRUCTIONS  Ensure adequate fluid intake (hydration): Have 1 cup (8 oz) of fluid for each diarrhea episode. Avoid fluids that contain simple sugars or sports drinks, fruit juices, whole milk products, and sodas. Your urine should be clear or pale yellow if you are drinking enough fluids. Hydrate with an oral rehydration solution that you can purchase at pharmacies, retail stores, and online. You can prepare an oral rehydration solution at home by mixing the following ingredients together:   - tsp table salt.   tsp baking soda.   tsp salt substitute containing potassium chloride.  1  tablespoons sugar.  1 L (34 oz) of water.  Certain foods and beverages may increase the speed at which food moves through the gastrointestinal (GI) tract. These foods and beverages should be avoided and include:  Caffeinated and alcoholic beverages.  High-fiber foods, such as raw fruits and vegetables, nuts, seeds, and whole grain breads and cereals.  Foods  and beverages sweetened with sugar alcohols, such as xylitol, sorbitol, and mannitol.  Some foods may be well tolerated and may help thicken stool including:  Starchy foods, such as rice, toast, pasta, low-sugar cereal, oatmeal, grits, baked potatoes, crackers, and bagels.  Bananas.  Applesauce.  Add probiotic-rich foods to help increase healthy bacteria in the GI tract, such as yogurt and fermented milk products.  Wash your hands well after each diarrhea episode.  Only take over-the-counter or prescription medicines as directed by your caregiver.  Take a warm bath to relieve any burning or pain from frequent diarrhea episodes. SEEK IMMEDIATE MEDICAL CARE IF:   You are unable to keep fluids down.  You have persistent vomiting.  You have blood in your stool, or your stools are black and tarry.  You do not urinate in 6-8 hours, or there is only a small amount of very dark urine.  You have abdominal pain that increases or localizes.  You have weakness, dizziness, confusion, or light-headedness.  You have a severe headache.  Your diarrhea gets worse or does not get better.  You have a fever or persistent symptoms for more than 2-3 days.  You have a fever and your symptoms suddenly get worse. MAKE SURE YOU:   Understand these instructions.  Will watch your condition.  Will get help right away if you are not doing well or get worse.   This information is not intended to replace advice given to you by your health care provider. Make sure you discuss any questions you have with your health care provider.   Document Released: 07/29/2002 Document Revised: 08/29/2014 Document Reviewed: 04/15/2012  Elsevier Interactive Patient Education ©2016 [employer] ° °

## 2015-08-14 NOTE — Telephone Encounter (Signed)
Per chart review pt is at [facility] now. 

## 2015-08-14 NOTE — ED Notes (Signed)
Patient transported to CT 

## 2015-08-14 NOTE — ED Provider Notes (Signed)
CSN: [id number]     Arrival date & time 08/14/15  1012 History   First MD Initiated Contact with Patient 08/14/15 1020     Chief Complaint  Patient presents with  . Weakness  . Abdominal Pain     (Consider location/radiation/quality/duration/timing/severity/associated sxs/prior Treatment) HPI Comments: Patient with a history of Barrett's esophagus, hyperlipidemia, hypertension and von Willebrand disease presents with abdominal pain. He states he was treated about 3 weeks ago with Cipro and Flagyl for presumed diverticulitis. He's had a history of diverticulitis in the past and was having similar symptoms. He then started having pain more and is rectal area and went to his urologist where he was diagnosed with prostatitis. On rectal exam he had significant prostate tenderness. He's been on ongoing Cipro for this. He states that over the last 2 days he's had dizziness, mostly on standing. He's had increased pain to his lower abdomen and prostate area. He's had some nausea and 2 episodes of vomiting although he states it's not uncommon for him to have occasional vomiting due to his reflux. He denies any known fevers. He's had a little bit of cough. No urinary symptoms. He's had some diarrhea which is watery, nonbloody.  Patient is a 81 y.o. male presenting with weakness and abdominal pain.  Weakness Associated symptoms include abdominal pain. Pertinent negatives include no chest pain, no headaches and no shortness of breath.  Abdominal Pain Associated symptoms: cough, diarrhea and fatigue   Associated symptoms: no chest pain, no chills, no fever, no hematuria, no nausea, no shortness of breath and no vomiting     Past Medical History  Diagnosis Date  . Barrett esophagus 09/2003  . GERD (gastroesophageal reflux disease)   . Hemorrhoids   . Diverticulosis   . Hyperlipidemia   . Hypertension   . Von Willebrand disease (HCC)   . PUD (peptic ulcer disease)   . Hearing loss   . GI AVM  (gastrointestinal arteriovenous vascular malformation)   . History of prostatitis   . Hemorrhoids   . Macular degeneration, wet (HCC)     receiving intra-ocular injections ([facility])  . Back pain     improved with gabapentin as of 2015  . Stroke [facility])    Past Surgical History  Procedure Laterality Date  . Inguinal hernia repair  2001  . Laparoscopic appendectomy N/A 07/07/2014    Procedure: APPENDECTOMY LAPAROSCOPIC;  Surgeon: Manus Rudd, MD;  Location: [facility] OR;  Service: General;  Laterality: N/A;   Family History  Problem Relation Age of Onset  . Heart attack Father   . Coronary artery disease Father   . Hypertension Mother   . Pancreatic cancer Mother     Died at 47.  . Cancer Mother     Pancreatic cancer  . Arthritis Sister   . Hyperlipidemia Sister   . Prostate cancer Brother   . Cancer Brother     Bone Cancer secondary to Prostate Cancer  . Diabetes Neg Hx    Social History  Substance Use Topics  . Smoking status: Former Smoker -- 1.00 packs/day for 36 years    Types: Cigarettes  . Smokeless tobacco: Never Used     Comment: Quit in 1983  . Alcohol Use: No    Review of Systems  Constitutional: Positive for fatigue. Negative for fever, chills and diaphoresis.  HENT: Negative for congestion, rhinorrhea and sneezing.   Eyes: Negative.   Respiratory: Positive for cough. Negative for chest tightness and shortness of breath.   Cardiovascular:  Negative for chest pain and leg swelling.  Gastrointestinal: Positive for abdominal pain and diarrhea. Negative for nausea, vomiting and blood in stool.  Genitourinary: Negative for frequency, hematuria, flank pain and difficulty urinating.  Musculoskeletal: Negative for back pain and arthralgias.  Skin: Negative for rash.  Neurological: Positive for weakness (generalized) and light-headedness. Negative for dizziness, speech difficulty, numbness and headaches.      Allergies  Atorvastatin; Codeine; Lisinopril; and  Statins  Home Medications   Prior to Admission medications   Medication Sig Start Date End Date Taking? Authorizing Provider  acetaminophen (TYLENOL) 500 MG tablet Take 500 mg by mouth every 6 (six) hours as needed for mild pain, moderate pain or headache.    Historical Provider, MD  Alirocumab (PRALUENT) 150 MG/ML SOSY Inject 150 mg into the skin every 14 (fourteen) days. 04/06/15   Joaquim Nam, MD  Bevacizumab (AVASTIN IV) Inject in both eyes    Historical Provider, MD  cetirizine (ZYRTEC) 10 MG tablet Take 10 mg by mouth daily as needed for allergies.     Historical Provider, MD  ciprofloxacin (CIPRO) 500 MG tablet Take 1 tablet (500 mg total) by mouth 2 (two) times daily. 07/23/15   Joaquim Nam, MD  clopidogrel (PLAVIX) 75 MG tablet Take 1 tablet (75 mg total) by mouth daily. 02/27/15   Joaquim Nam, MD  diltiazem (DILACOR XR) 180 MG 24 hr capsule Take 1 capsule (180 mg total) by mouth daily. 02/27/15   Joaquim Nam, MD  fluticasone (FLONASE) 50 MCG/ACT nasal spray Place 2 sprays into both nostrils daily as needed for allergies. 10/07/14   Joaquim Nam, MD  gabapentin (NEURONTIN) 300 MG capsule TAKE ONE CAPSULE BY MOUTH TWICE A DAY 03/25/15   Joaquim Nam, MD  gemfibrozil (LOPID) 600 MG tablet Take 1 tablet (600 mg total) by mouth 2 (two) times daily. 08/12/15   Joaquim Nam, MD  Hypromellose (ARTIFICIAL TEARS OP) Apply 1 drop to eye daily as needed (itchy / watery eyes).    Historical Provider, MD  loperamide (IMODIUM) 2 MG capsule Take 1 capsule (2 mg total) by mouth 3 (three) times daily as needed for diarrhea or loose stools. 07/15/14   Elease Etienne, MD  metoprolol succinate (TOPROL-XL) 25 MG 24 hr tablet Take 1 tablet (25 mg total) by mouth at bedtime. 07/27/15   Joaquim Nam, MD  metroNIDAZOLE (FLAGYL) 500 MG tablet Take 1 tablet (500 mg total) by mouth 3 (three) times daily. 07/23/15   Joaquim Nam, MD  Multiple Vitamins-Minerals (OCUVITE ADULT 50+ PO) Take 1  capsule by mouth daily.      Historical Provider, MD  pantoprazole (PROTONIX) 40 MG tablet Take 1 tablet (40 mg total) by mouth 2 (two) times daily before a meal. 07/15/14 07/15/15  Elease Etienne, MD  ranitidine (ZANTAC) 150 MG tablet Take 1 tablet (150 mg total) by mouth 2 (two) times daily. 07/27/15   Joaquim Nam, MD  sucralfate (CARAFATE) 1 G tablet TAKE 1 TABLET BY MOUTH BEFORE LAYING DOWN FOR NAP OR BEDTIME 11/24/14   Joaquim Nam, MD   BP 161/88 mmHg  Pulse 85  Temp(Src) 97.9 F (36.6 C) (Oral)  Resp 17  SpO2 95% Physical Exam  Constitutional: He is oriented to person, place, and time. He appears well-developed and well-nourished.  HENT:  Head: Normocephalic and atraumatic.  Eyes: Pupils are equal, round, and reactive to light.  Neck: Normal range of motion. Neck supple.  Cardiovascular:  Normal rate, regular rhythm and normal heart sounds.   Pulmonary/Chest: Effort normal and breath sounds normal. No respiratory distress. He has no wheezes. He has no rales. He exhibits no tenderness.  Abdominal: Soft. Bowel sounds are normal. There is tenderness (Mild tenderness across the lower abdomen and suprapubic area). There is no rebound and no guarding.  Genitourinary:  Normal external male genitalia. Uncircumcised penis. There is no testicular tenderness. No rash or erythema. Mild prostate tenderness.  Musculoskeletal: Normal range of motion. He exhibits no edema.  Lymphadenopathy:    He has no cervical adenopathy.  Neurological: He is alert and oriented to person, place, and time. He has normal strength. No cranial nerve deficit or sensory deficit. GCS eye subscore is 4. GCS verbal subscore is 5. GCS motor subscore is 6.  Skin: Skin is warm and dry. No rash noted.  Psychiatric: He has a normal mood and affect.    ED Course  Procedures (including critical care time) Labs Review Results for orders placed or performed during the hospital encounter of 08/14/15  CBC with  Differential  Result Value Ref Range   WBC 6.5 4.0 - 10.5 K/uL   RBC 5.03 4.22 - 5.81 MIL/uL   Hemoglobin 12.9 (L) 13.0 - 17.0 g/dL   HCT 78.2 95.6 - 21.3 %   MCV 82.3 78.0 - 100.0 fL   MCH 25.6 (L) 26.0 - 34.0 pg   MCHC 31.2 30.0 - 36.0 g/dL   RDW 08.6 57.8 - 46.9 %   Platelets 325 150 - 400 K/uL   Neutrophils Relative % 55 %   Neutro Abs 3.7 1.7 - 7.7 K/uL   Lymphocytes Relative 30 %   Lymphs Abs 2.0 0.7 - 4.0 K/uL   Monocytes Relative 10 %   Monocytes Absolute 0.6 0.1 - 1.0 K/uL   Eosinophils Relative 4 %   Eosinophils Absolute 0.2 0.0 - 0.7 K/uL   Basophils Relative 1 %   Basophils Absolute 0.0 0.0 - 0.1 K/uL  Comprehensive metabolic panel  Result Value Ref Range   Sodium 141 135 - 145 mmol/L   Potassium 4.5 3.5 - 5.1 mmol/L   Chloride 108 101 - 111 mmol/L   CO2 24 22 - 32 mmol/L   Glucose, Bld 95 65 - 99 mg/dL   BUN 13 6 - 20 mg/dL   Creatinine, Ser 6.29 0.61 - 1.24 mg/dL   Calcium 9.3 8.9 - 52.8 mg/dL   Total Protein 6.9 6.5 - 8.1 g/dL   Albumin 3.6 3.5 - 5.0 g/dL   AST 28 15 - 41 U/L   ALT 14 (L) 17 - 63 U/L   Alkaline Phosphatase 58 38 - 126 U/L   Total Bilirubin 0.6 0.3 - 1.2 mg/dL   GFR calc non Af Amer 56 (L) >60 mL/min   GFR calc Af Amer >60 >60 mL/min   Anion gap 9 5 - 15  Urinalysis, Routine w reflex microscopic (not at [facility])  Result Value Ref Range   Color, Urine YELLOW YELLOW   APPearance CLEAR CLEAR   Specific Gravity, Urine 1.010 1.005 - 1.030   pH 5.5 5.0 - 8.0   Glucose, UA NEGATIVE NEGATIVE mg/dL   Hgb urine dipstick NEGATIVE NEGATIVE   Bilirubin Urine NEGATIVE NEGATIVE   Ketones, ur NEGATIVE NEGATIVE mg/dL   Protein, ur NEGATIVE NEGATIVE mg/dL   Nitrite NEGATIVE NEGATIVE   Leukocytes, UA NEGATIVE NEGATIVE   Dg Chest 2 View  08/14/2015  CLINICAL DATA:  Chest and abdominal pain ; dizziness  for 2 days EXAM: CHEST  2 VIEW COMPARISON:  March 29, 2015 FINDINGS: There is no appreciable edema or consolidation. Heart size and pulmonary vascularity  are within normal limits. No adenopathy. No bone lesions. There is degenerative change in the thoracolumbar junction region. IMPRESSION: No edema or consolidation. Electronically Signed   By: Bretta BangWilliam  Woodruff III M.D.   On: 08/14/2015 11:10   Ct Abdomen Pelvis W Contrast  08/14/2015  CLINICAL DATA:  81 year old male with lower abdominal pain diarrhea and dizziness for 2 weeks. Initial encounter. EXAM: CT ABDOMEN AND PELVIS WITH CONTRAST TECHNIQUE: Multidetector CT imaging of the abdomen and pelvis was performed using the standard protocol following bolus administration of intravenous contrast. CONTRAST:  80mL OMNIPAQUE IOHEXOL 300 MG/ML  SOLN COMPARISON:  08/16/2014 and earlier. FINDINGS: Stable lung bases with dependent atelectasis and/or scarring. No pleural effusion. Stable cardiomegaly. Hiatal hernia containing mesenteric fat is stable and MK small to moderate. No acute osseous abnormality identified. No pelvic free fluid. Mild distention of the urinary bladder is stable. Decompressed rectum. Mildly redundant sigmoid colon with diverticulosis. No active inflammation identified. Decompressed left colon. There is fluid in the distal colon. The transverse colon is decompressed with fluid. Fluid throughout the right colon. Occasional right colon diverticula with no active inflammation. Previous appendectomy. Negative ileocecal valve. No dilated or inflamed small bowel. Intra abdominal stomach is decompressed and negative. Negative duodenum. No free air or free fluid in the abdomen. Mild hepatic steatosis. Otherwise negative liver, gallbladder, spleen (punctate calcified granuloma), pancreas and adrenal glands. There is a stable small duodenum diverticulum. Portal venous system is patent. Calcified and soft atherosclerotic plaque throughout the aorta. Mild infrarenal abdominal aortic enlargement at 29 mm is stable to minimally increased (28 mm previously). Major arterial structures remain patent. No  lymphadenopathy. Bilateral renal enhancement and contrast excretion within normal limits. IMPRESSION: 1. Fluid throughout the colon compatible with diarrhea, but otherwise no acute or inflammatory process in the abdomen or pelvis. 2. Extensive soft and calcified atherosclerosis in the aorta, its branches, and the pelvic arteries. No major arterial occlusion identified. Stable mild infrarenal abdominal aortic aneurysm, 29 mm. Recommend followup by Ultrasound in 5 years. This recommendation follows ACR consensus guidelines: White Paper of the ACR Incidental Findings Committee II on Vascular Findings. J Am Coll Radiol 2013; 10:789-794. Electronically Signed   By: Odessa FlemingH  Hall M.D.   On: 08/14/2015 12:43      Imaging Review Dg Chest 2 View  08/14/2015  CLINICAL DATA:  Chest and abdominal pain ; dizziness for 2 days EXAM: CHEST  2 VIEW COMPARISON:  March 29, 2015 FINDINGS: There is no appreciable edema or consolidation. Heart size and pulmonary vascularity are within normal limits. No adenopathy. No bone lesions. There is degenerative change in the thoracolumbar junction region. IMPRESSION: No edema or consolidation. Electronically Signed   By: Bretta BangWilliam  Woodruff III M.D.   On: 08/14/2015 11:10   Ct Abdomen Pelvis W Contrast  08/14/2015  CLINICAL DATA:  81 year old male with lower abdominal pain diarrhea and dizziness for 2 weeks. Initial encounter. EXAM: CT ABDOMEN AND PELVIS WITH CONTRAST TECHNIQUE: Multidetector CT imaging of the abdomen and pelvis was performed using the standard protocol following bolus administration of intravenous contrast. CONTRAST:  80mL OMNIPAQUE IOHEXOL 300 MG/ML  SOLN COMPARISON:  08/16/2014 and earlier. FINDINGS: Stable lung bases with dependent atelectasis and/or scarring. No pleural effusion. Stable cardiomegaly. Hiatal hernia containing mesenteric fat is stable and MK small to moderate. No acute osseous abnormality identified. No pelvic free  fluid. Mild distention of the urinary  bladder is stable. Decompressed rectum. Mildly redundant sigmoid colon with diverticulosis. No active inflammation identified. Decompressed left colon. There is fluid in the distal colon. The transverse colon is decompressed with fluid. Fluid throughout the right colon. Occasional right colon diverticula with no active inflammation. Previous appendectomy. Negative ileocecal valve. No dilated or inflamed small bowel. Intra abdominal stomach is decompressed and negative. Negative duodenum. No free air or free fluid in the abdomen. Mild hepatic steatosis. Otherwise negative liver, gallbladder, spleen (punctate calcified granuloma), pancreas and adrenal glands. There is a stable small duodenum diverticulum. Portal venous system is patent. Calcified and soft atherosclerotic plaque throughout the aorta. Mild infrarenal abdominal aortic enlargement at 29 mm is stable to minimally increased (28 mm previously). Major arterial structures remain patent. No lymphadenopathy. Bilateral renal enhancement and contrast excretion within normal limits. IMPRESSION: 1. Fluid throughout the colon compatible with diarrhea, but otherwise no acute or inflammatory process in the abdomen or pelvis. 2. Extensive soft and calcified atherosclerosis in the aorta, its branches, and the pelvic arteries. No major arterial occlusion identified. Stable mild infrarenal abdominal aortic aneurysm, 29 mm. Recommend followup by Ultrasound in 5 years. This recommendation follows ACR consensus guidelines: White Paper of the ACR Incidental Findings Committee II on Vascular Findings. J Am Coll Radiol 2013; 10:789-794. Electronically Signed   By: Odessa Fleming M.D.   On: 08/14/2015 12:43   I have personally reviewed and evaluated these images and lab results as part of my medical decision-making.   EKG Interpretation   Date/Time:  Friday August 14 2015 13:37:27 EST Ventricular Rate:  78 PR Interval:  180 QRS Duration: 94 QT Interval:  379 QTC  Calculation: 432 R Axis:   30 Text Interpretation:  Sinus rhythm since last tracing no significant  change Confirmed by Janya Eveland  MD, Kianna Billet ([id number]) on 08/14/2015 2:30:59 PM      MDM   Final diagnoses:  Diarrhea, unspecified type    Patient presents with some vague abdominal tenderness and diarrhea. His urine is without signs of infection. His prostate exam was unremarkable. He was given some IV fluids and he is a lady without any dizziness or symptoms. He's feeling better. His abdominal exam is benign. I am concerned about possible C. difficile given his recent antibiotic usage. He's not been able to provide a stool sample while in the ED. He doesn't want to stay any longer. He states that he can try to collect a stool sample at home and bring it to the lab for testing. I advised him to follow-up with his PCP. He has an appointment on December 28. I did advise him and his wife that he has a small aneurysm which will need outpatient follow-up. They will discuss this with Dr. Para March. Advised him return if he has any worsening symptoms over the holiday weekend.    Rolan Bucco, MD 08/14/15 [phone]

## 2015-08-14 NOTE — Telephone Encounter (Signed)
TELEPHONE ADVICE RECORD [facility]  Patient Name: Martin PurpuraJOHN Alarid  DOB: 08-10-1926    Initial Comment caller states her husband has had some mini strokes in the last few months - this am he is dizzy and is still in bed this am - which is not like him - his blood pressure was high yesterday - 175/86 -    Nurse Assessment  Nurse: Odis LusterBowers, RN, Bjorn Loserhonda Date/Time Lamount Cohen(Eastern Time): 08/14/2015 9:18:33 AM  Confirm and document reason for call. If symptomatic, describe symptoms. ---caller states her husband has had some mini strokes in the last few months - this am he is dizzy and is still in bed this am - which is not like him - his blood pressure was high yesterday - 175/86 - 168/95 currently, irregular heartbeat. Reports that he has a headache that comes/goes. Symptoms began yesterday morning.  Has the patient traveled out of the country within the last 30 days? ---Not Applicable  Does the patient have any new or worsening symptoms? ---Yes  Will a triage be completed? ---Yes  Related visit to physician within the last 2 weeks? ---Yes  Does the PT have any chronic conditions? (i.e. diabetes, asthma, etc.) ---Yes  List chronic conditions. ---hypertension; high chol; acid reflux; diverticulitis on occasion.  Is this a behavioral health or substance abuse call? ---No     Guidelines    Guideline Title Affirmed Question Affirmed Notes  Dizziness - Lightheadedness Extra heart beats OR irregular heart beating (i.e., "palpitations")    Final Disposition User   Go to ED Now (or PCP triage) Odis LusterBowers, RN, Rhonda    Comments  Caller states that the patient will only see Dr. Para Marchuncan in the office. Dr. Para Marchuncan is unavailable today, [facility]   [facility]

## 2015-08-14 NOTE — ED Notes (Signed)
Patient transported to X-ray 

## 2015-08-17 ENCOUNTER — Other Ambulatory Visit: Payer: Self-pay | Admitting: Family Medicine

## 2015-08-17 DIAGNOSIS — R197 Diarrhea, unspecified: Secondary | ICD-10-CM

## 2015-08-17 DIAGNOSIS — E7800 Pure hypercholesterolemia, unspecified: Secondary | ICD-10-CM

## 2015-08-17 LAB — C DIFFICILE QUICK SCREEN W PCR REFLEX
C Diff antigen: NEGATIVE
C Diff interpretation: NEGATIVE
C Diff toxin: NEGATIVE

## 2015-08-19 ENCOUNTER — Other Ambulatory Visit: Payer: Medicare HMO

## 2015-08-19 ENCOUNTER — Other Ambulatory Visit (INDEPENDENT_AMBULATORY_CARE_PROVIDER_SITE_OTHER): Payer: Medicare HMO

## 2015-08-19 DIAGNOSIS — E7800 Pure hypercholesterolemia, unspecified: Secondary | ICD-10-CM | POA: Diagnosis not present

## 2015-08-19 LAB — LIPID PANEL
Cholesterol: 177 mg/dL (ref 0–200)
HDL: 55.1 mg/dL (ref >39.00)
LDL Cholesterol: 91 mg/dL (ref 0–99)
NonHDL: 122.09
Total CHOL/HDL Ratio: 3
Triglycerides: 153 mg/dL — ABNORMAL HIGH (ref 0.0–149.0)
VLDL: 30.6 mg/dL (ref 0.0–40.0)

## 2015-08-20 ENCOUNTER — Other Ambulatory Visit: Payer: Medicare HMO

## 2015-08-21 ENCOUNTER — Encounter: Payer: Self-pay | Admitting: Family Medicine

## 2015-08-21 ENCOUNTER — Ambulatory Visit (INDEPENDENT_AMBULATORY_CARE_PROVIDER_SITE_OTHER): Payer: Medicare HMO | Admitting: Family Medicine

## 2015-08-21 VITALS — BP 136/76 | HR 61 | Temp 97.5°F | Ht 69.0 in | Wt 160.2 lb

## 2015-08-21 DIAGNOSIS — I77811 Abdominal aortic ectasia: Secondary | ICD-10-CM | POA: Diagnosis not present

## 2015-08-21 DIAGNOSIS — Z0000 Encounter for general adult medical examination without abnormal findings: Secondary | ICD-10-CM | POA: Diagnosis not present

## 2015-08-21 DIAGNOSIS — I10 Essential (primary) hypertension: Secondary | ICD-10-CM | POA: Diagnosis not present

## 2015-08-21 DIAGNOSIS — Z7189 Other specified counseling: Secondary | ICD-10-CM

## 2015-08-21 DIAGNOSIS — E785 Hyperlipidemia, unspecified: Secondary | ICD-10-CM

## 2015-08-21 DIAGNOSIS — R413 Other amnesia: Secondary | ICD-10-CM

## 2015-08-21 DIAGNOSIS — I779 Disorder of arteries and arterioles, unspecified: Secondary | ICD-10-CM

## 2015-08-21 MED ORDER — FLUTICASONE PROPIONATE 50 MCG/ACT NA SUSP: 2.0000 | Freq: Every day | NASAL | Status: DC | PRN

## 2015-08-21 MED ORDER — DILTIAZEM HCL ER 180 MG PO CP24: 180.0000 mg | ORAL_CAPSULE | Freq: Every day | ORAL | Status: DC

## 2015-08-21 MED ORDER — CLOPIDOGREL BISULFATE 75 MG PO TABS: 75.0000 mg | ORAL_TABLET | Freq: Every day | ORAL | Status: DC

## 2015-08-21 MED ORDER — HYDROCHLOROTHIAZIDE 12.5 MG PO TABS: 6.2500 mg | ORAL_TABLET | Freq: Every day | ORAL | Status: DC

## 2015-08-21 NOTE — Patient Instructions (Signed)
Start taking 1/2 tab of hydrochlorothiazide and update me with your BP next week.   If lightheaded, then stop the medicine.  Take care.  Glad to see you.

## 2015-08-21 NOTE — Progress Notes (Signed)
Pre visit review using our clinic review tool, if applicable. No additional management support is needed unless otherwise documented below in the visit note.  I have personally reviewed the Medicare Annual Wellness questionnaire and have noted 1. The patient's medical and social history 2. Their use of alcohol, tobacco or illicit drugs 3. Their current medications and supplements 4. The patient's functional ability including ADL's, fall risks, home safety risks and hearing or visual             impairment. 5. Diet and physical activities 6. Evidence for depression or mood disorders  The patients weight, height, BMI have been recorded in the chart and visual acuity is per eye clinic.  I have made referrals, counseling and provided education to the patient based review of the above and I have provided the pt with a written personalized care plan for preventive services.  Provider list updated- see scanned forms.  Routine anticipatory guidance given to patient.  See health maintenance.  Flu 2016 Shingles prev done PNA 2014 Tetanus 2011 Colonoscopy NA due to age, he agrees.  Prostate cancer screening per URI if needed.  I'll defer to uro Advance directive- wife designated if patient were incapacitated Cognitive function addressed- see scanned forms- and if abnormal then additional documentation follows.   Elevated Cholesterol: Using medications without problems:yes Muscle aches: no Diet compliance:yes Exercise: as tolerated, limited.  Labs d/w pt.    Hypertension:    Using medication without problems or lightheadedness: yes Chest pain with exertion:no Edema:no Short of breath:no Average home BPs: consistently SBP 150s or higher at home.   AAA noted on imaging.  We can consider f/u imaging in a few years if needed.  No sx.    c diff pref neg, d/w pt.    PMH and SH reviewed  Meds, vitals, and allergies reviewed.   ROS: See HPI.  Otherwise negative.    GEN: nad, alert and  oriented HEENT: mucous membranes moist NECK: supple w/o LA CV: rrr. PULM: ctab, no inc wob ABD: soft, +bs EXT: no edema SKIN: no acute rash

## 2015-08-24 DIAGNOSIS — I779 Disorder of arteries and arterioles, unspecified: Secondary | ICD-10-CM | POA: Insufficient documentation

## 2015-08-24 NOTE — Assessment & Plan Note (Signed)
Flu 2016  Shingles prev done  PNA 2014  Tetanus 2011  Colonoscopy NA due to age, he agrees.  Prostate cancer screening per URI if needed. I'll defer to uro  Advance directive- wife designated if patient were incapacitated  Cognitive function addressed- see scanned forms- and if abnormal then additional documentation follows.

## 2015-08-24 NOTE — Assessment & Plan Note (Signed)
Advance directive- wife designated if patient were incapacitated.  

## 2015-08-24 NOTE — Assessment & Plan Note (Signed)
He had trouble with recall during testing, but this could be due to attention/hearing.  He doesn't have new deficits, his memory was o/w wnl during the exam and his wife hasn't noted any changes.  We can monitor periodically.  He agrees, as does she.

## 2015-08-24 NOTE — Assessment & Plan Note (Signed)
We can follow this episodically with u/s as needed.  D/w pt.  He agrees.  CT report reviewed with patient.

## 2015-08-24 NOTE — Assessment & Plan Note (Signed)
I don't want to induce hypotension.  Start taking 1/2 tab of hydrochlorothiazide and he'll update me with his BP next week.  Okay for outpatient f/u.

## 2015-08-24 NOTE — Assessment & Plan Note (Signed)
Continue as is, no ADE on med.  D/w pt.

## 2015-08-27 ENCOUNTER — Encounter: Payer: Medicare HMO | Admitting: Family Medicine

## 2015-09-01 ENCOUNTER — Telehealth: Payer: Self-pay | Admitting: Family Medicine

## 2015-09-01 NOTE — Telephone Encounter (Signed)
Recent bp readings 12/31 140/72 1/1/ 139/72 1/2 150/76 01/3 152/76 01/04 183/84           183/82            164/83 01/05 152/77 01/06  168/78             146/71 01/07   180/77              163/75 01/08    141/72 01/09    153/80 01/10    164/75 cb number is [phone]

## 2015-09-02 MED ORDER — HYDROCHLOROTHIAZIDE 12.5 MG PO TABS: 12.5000 mg | ORAL_TABLET | Freq: Every day | ORAL | Status: DC

## 2015-09-02 NOTE — Telephone Encounter (Signed)
Patient's wife notified as instructed by telephone and verbalized understanding. 

## 2015-09-02 NOTE — Telephone Encounter (Signed)
Left message on answering machine at home number to call back. 

## 2015-09-02 NOTE — Telephone Encounter (Signed)
Inc the HCTZ to a full pill per day.  [facility] as he'll run out with the old rx.   Update me in about 1 week re: BP.  Thanks.

## 2015-09-14 MED ORDER — HYDROCHLOROTHIAZIDE 25 MG PO TABS: 25.0000 mg | ORAL_TABLET | Freq: Every day | ORAL | Status: DC

## 2015-09-14 NOTE — Addendum Note (Signed)
Addended by: Joaquim Nam on: 09/14/2015 11:35 PM   Modules accepted: Orders

## 2015-09-14 NOTE — Telephone Encounter (Signed)
Would inc to  HCTZ a day.  Can take 12.5x2 per day for now, then change to new rx sent (new rx with  tabs).  Update me re: BP in about 1 week.  Thanks.

## 2015-09-14 NOTE — Telephone Encounter (Addendum)
Mrs Pat called with BP readings; 01/12 /17 BP 170/85 at 9:40 AM   Later that day at 10:37 AM BP was 158/75;  09/04/15 BP 161/75;   09/05/15 BP 163/85;  09/06/15 BP 155/81 at 9:18 AM;  Same day at 3:33PM  BP 163/78. 09/07/15 BP 164/79 at 8:53 AM; same day at 11:10 BP 157/73;  09/08/15 BP 162/87; 09/09/15 BP 152/74; 09/10/15 BP 147/81 at 8:18 AM and later that day BP 158/78 at 4:17 PM.  09/13/15 BP 157/69;  09/14/15 BP 157/72. Pt has occasional dizziness; no complaint of dizziness today; advised to get out of bed or chair slowly, both knees and lower legs hurt but Mrs Whisenant thinks arthritis pain. Mrs Zangara will wait to hear back. Mrs Juhasz is aware Dr Para March out of office today.

## 2015-09-15 ENCOUNTER — Telehealth: Payer: Self-pay

## 2015-09-15 ENCOUNTER — Other Ambulatory Visit: Payer: Self-pay | Admitting: *Deleted

## 2015-09-15 MED ORDER — HYDROCHLOROTHIAZIDE 25 MG PO TABS: 25.0000 mg | ORAL_TABLET | Freq: Every day | ORAL | Status: DC

## 2015-09-15 NOTE — Telephone Encounter (Signed)
Submitted thru [facility], awaiting results.

## 2015-09-15 NOTE — Telephone Encounter (Signed)
Wife advised.  New Rx sent.

## 2015-09-15 NOTE — Telephone Encounter (Signed)
Mrs Chhim, pts wife(DPR signed) left v/m that pt received letter from ins co that Praluent will not be covered unless prior authorization is approved. Mrs Belcastro request cb.

## 2015-09-17 NOTE — Telephone Encounter (Signed)
Approval letter received and scanned.  Pt and pharmacy advised.

## 2015-09-25 ENCOUNTER — Telehealth: Payer: Self-pay | Admitting: *Deleted

## 2015-09-25 NOTE — Telephone Encounter (Signed)
Pt's wife left message at Triage with pt's BP readings, the days he checked it twice he check it morning and then again in the afternoon  Date          BP reading 09/15/15: 166/77, 159/75 09/16/15: 152/75, 156/69 09/17/15: 141/69  09/18/15: 172/73 09/19/15:166/82 09/20/15: 158/75 09/23/15: 143/67 09/24/15: 152/72 09/25/15: 144/65, 145/65

## 2015-09-28 NOTE — Telephone Encounter (Signed)
Patient advised.

## 2015-09-28 NOTE — Telephone Encounter (Signed)
No answer, no VM. Will try again later.

## 2015-09-28 NOTE — Telephone Encounter (Signed)
Call pt.  With DBP that low, I wouldn't increase his meds.  Would continue as is.  Thanks for update.

## 2015-10-06 ENCOUNTER — Other Ambulatory Visit: Payer: Self-pay | Admitting: *Deleted

## 2015-10-06 NOTE — Telephone Encounter (Signed)
Faxed refill request. Last Filled:    2 mL 5 04/06/2015  Please advise.

## 2015-10-07 MED ORDER — ALIROCUMAB 150 MG/ML ~~LOC~~ SOSY: 150.0000 mg | PREFILLED_SYRINGE | SUBCUTANEOUS | Status: DC

## 2015-10-07 NOTE — Telephone Encounter (Signed)
Sent. Thanks.   

## 2015-10-26 DIAGNOSIS — Z66 Do not resuscitate: Secondary | ICD-10-CM | POA: Insufficient documentation

## 2015-10-26 DIAGNOSIS — Z8673 Personal history of transient ischemic attack (TIA), and cerebral infarction without residual deficits: Secondary | ICD-10-CM | POA: Insufficient documentation

## 2015-10-26 DIAGNOSIS — Z7902 Long term (current) use of antithrombotics/antiplatelets: Secondary | ICD-10-CM | POA: Insufficient documentation

## 2015-10-26 DIAGNOSIS — D735 Infarction of spleen: Principal | ICD-10-CM | POA: Insufficient documentation

## 2015-10-26 DIAGNOSIS — H3530 Unspecified macular degeneration: Secondary | ICD-10-CM | POA: Insufficient documentation

## 2015-10-26 DIAGNOSIS — K5732 Diverticulitis of large intestine without perforation or abscess without bleeding: Secondary | ICD-10-CM | POA: Insufficient documentation

## 2015-10-26 DIAGNOSIS — I77811 Abdominal aortic ectasia: Secondary | ICD-10-CM | POA: Insufficient documentation

## 2015-10-26 DIAGNOSIS — K219 Gastro-esophageal reflux disease without esophagitis: Secondary | ICD-10-CM | POA: Insufficient documentation

## 2015-10-26 DIAGNOSIS — Z79899 Other long term (current) drug therapy: Secondary | ICD-10-CM | POA: Insufficient documentation

## 2015-10-26 DIAGNOSIS — I10 Essential (primary) hypertension: Secondary | ICD-10-CM | POA: Insufficient documentation

## 2015-10-26 DIAGNOSIS — D680 Von Willebrand's disease: Secondary | ICD-10-CM | POA: Insufficient documentation

## 2015-10-26 DIAGNOSIS — E785 Hyperlipidemia, unspecified: Secondary | ICD-10-CM | POA: Insufficient documentation

## 2015-10-26 DIAGNOSIS — Z87891 Personal history of nicotine dependence: Secondary | ICD-10-CM | POA: Insufficient documentation

## 2015-10-27 ENCOUNTER — Emergency Department (HOSPITAL_COMMUNITY): Payer: Medicare HMO

## 2015-10-27 ENCOUNTER — Encounter (HOSPITAL_COMMUNITY): Payer: Self-pay | Admitting: Emergency Medicine

## 2015-10-27 ENCOUNTER — Observation Stay (HOSPITAL_COMMUNITY)
Admission: EM | Admit: 2015-10-27 | Discharge: 2015-10-29 | Disposition: A | Discharge status: Home / Self Care | Payer: Medicare HMO | Attending: Internal Medicine | Admitting: Internal Medicine

## 2015-10-27 DIAGNOSIS — D735 Infarction of spleen: Principal | ICD-10-CM

## 2015-10-27 DIAGNOSIS — I77811 Abdominal aortic ectasia: Secondary | ICD-10-CM | POA: Diagnosis not present

## 2015-10-27 DIAGNOSIS — D6800 Von Willebrand disease, unspecified: Secondary | ICD-10-CM | POA: Diagnosis present

## 2015-10-27 DIAGNOSIS — R109 Unspecified abdominal pain: Secondary | ICD-10-CM | POA: Diagnosis present

## 2015-10-27 DIAGNOSIS — E785 Hyperlipidemia, unspecified: Secondary | ICD-10-CM | POA: Diagnosis present

## 2015-10-27 DIAGNOSIS — I10 Essential (primary) hypertension: Secondary | ICD-10-CM | POA: Diagnosis present

## 2015-10-27 DIAGNOSIS — D72829 Elevated white blood cell count, unspecified: Secondary | ICD-10-CM

## 2015-10-27 DIAGNOSIS — K5732 Diverticulitis of large intestine without perforation or abscess without bleeding: Secondary | ICD-10-CM | POA: Diagnosis present

## 2015-10-27 LAB — URINALYSIS, ROUTINE W REFLEX MICROSCOPIC
Bilirubin Urine: NEGATIVE
Glucose, UA: NEGATIVE mg/dL
Hgb urine dipstick: NEGATIVE
Ketones, ur: NEGATIVE mg/dL
Leukocytes, UA: NEGATIVE
Nitrite: NEGATIVE
Protein, ur: NEGATIVE mg/dL
Specific Gravity, Urine: 1.009 (ref 1.005–1.030)
pH: 5.5 (ref 5.0–8.0)

## 2015-10-27 LAB — CBC
HCT: 35.4 % — ABNORMAL LOW (ref 39.0–52.0)
Hemoglobin: 11.5 g/dL — ABNORMAL LOW (ref 13.0–17.0)
MCH: 25.6 pg — ABNORMAL LOW (ref 26.0–34.0)
MCHC: 32.5 g/dL (ref 30.0–36.0)
MCV: 78.8 fL (ref 78.0–100.0)
Platelets: 267 10*3/uL (ref 150–400)
RBC: 4.49 MIL/uL (ref 4.22–5.81)
RDW: 14.7 % (ref 11.5–15.5)
WBC: 10.3 10*3/uL (ref 4.0–10.5)

## 2015-10-27 LAB — COMPREHENSIVE METABOLIC PANEL
ALT: 10 U/L — ABNORMAL LOW (ref 17–63)
AST: 20 U/L (ref 15–41)
Albumin: 3.5 g/dL (ref 3.5–5.0)
Alkaline Phosphatase: 78 U/L (ref 38–126)
Anion gap: 12 (ref 5–15)
BUN: 18 mg/dL (ref 6–20)
CO2: 24 mmol/L (ref 22–32)
Calcium: 9.1 mg/dL (ref 8.9–10.3)
Chloride: 104 mmol/L (ref 101–111)
Creatinine, Ser: 1.26 mg/dL — ABNORMAL HIGH (ref 0.61–1.24)
GFR calc Af Amer: 57 mL/min — ABNORMAL LOW (ref >60)
GFR calc non Af Amer: 49 mL/min — ABNORMAL LOW (ref >60)
Glucose, Bld: 108 mg/dL — ABNORMAL HIGH (ref 65–99)
Potassium: 3.5 mmol/L (ref 3.5–5.1)
Sodium: 140 mmol/L (ref 135–145)
Total Bilirubin: 0.4 mg/dL (ref 0.3–1.2)
Total Protein: 7.1 g/dL (ref 6.5–8.1)

## 2015-10-27 LAB — ANTITHROMBIN III: AntiThromb III Func: 137 % — ABNORMAL HIGH (ref 75–120)

## 2015-10-27 LAB — LIPASE, BLOOD: Lipase: 27 U/L (ref 11–51)

## 2015-10-27 MED ORDER — IOHEXOL 300 MG/ML  SOLN
100.0000 mL | Freq: Once | INTRAMUSCULAR | Status: AC | PRN
  Administered 2015-10-27: 100 mL via INTRAVENOUS

## 2015-10-27 MED ORDER — GABAPENTIN 300 MG PO CAPS
300.0000 mg | ORAL_CAPSULE | Freq: Two times a day (BID) | ORAL | Status: DC
  Administered 2015-10-27 – 2015-10-29 (×5): 300 mg via ORAL
  Filled 2015-10-27 (×5): qty 1

## 2015-10-27 MED ORDER — CLOPIDOGREL BISULFATE 75 MG PO TABS
75.0000 mg | ORAL_TABLET | Freq: Every day | ORAL | Status: DC
  Administered 2015-10-27 – 2015-10-29 (×3): 75 mg via ORAL
  Filled 2015-10-27 (×3): qty 1

## 2015-10-27 MED ORDER — MORPHINE SULFATE (PF) 2 MG/ML IV SOLN
2.0000 mg | INTRAVENOUS | Status: DC | PRN
  Administered 2015-10-27: 4 mg via INTRAVENOUS
  Administered 2015-10-27 – 2015-10-28 (×4): 2 mg via INTRAVENOUS
  Filled 2015-10-27 (×3): qty 1
  Filled 2015-10-27: qty 2
  Filled 2015-10-27: qty 1

## 2015-10-27 MED ORDER — METOPROLOL SUCCINATE ER 25 MG PO TB24
25.0000 mg | ORAL_TABLET | Freq: Every day | ORAL | Status: DC
  Administered 2015-10-27 – 2015-10-28 (×2): 25 mg via ORAL
  Filled 2015-10-27 (×2): qty 1

## 2015-10-27 MED ORDER — ONDANSETRON HCL 4 MG/2ML IJ SOLN: 4.0000 mg | Freq: Four times a day (QID) | INTRAMUSCULAR | Status: DC | PRN

## 2015-10-27 MED ORDER — GEMFIBROZIL 600 MG PO TABS
600.0000 mg | ORAL_TABLET | Freq: Two times a day (BID) | ORAL | Status: DC
  Administered 2015-10-27 – 2015-10-29 (×5): 600 mg via ORAL
  Filled 2015-10-27 (×9): qty 1

## 2015-10-27 MED ORDER — HYDROCHLOROTHIAZIDE 25 MG PO TABS
25.0000 mg | ORAL_TABLET | Freq: Every day | ORAL | Status: DC
  Administered 2015-10-27 – 2015-10-29 (×3): 25 mg via ORAL
  Filled 2015-10-27 (×3): qty 1

## 2015-10-27 MED ORDER — DILTIAZEM HCL ER COATED BEADS 180 MG PO CP24
180.0000 mg | ORAL_CAPSULE | Freq: Every day | ORAL | Status: DC
  Administered 2015-10-27 – 2015-10-29 (×3): 180 mg via ORAL
  Filled 2015-10-27 (×5): qty 1

## 2015-10-27 MED ORDER — ONDANSETRON HCL 4 MG/2ML IJ SOLN
4.0000 mg | Freq: Once | INTRAMUSCULAR | Status: AC
  Administered 2015-10-27: 4 mg via INTRAVENOUS
  Filled 2015-10-27: qty 2

## 2015-10-27 MED ORDER — SENNOSIDES-DOCUSATE SODIUM 8.6-50 MG PO TABS: 1.0000 | ORAL_TABLET | Freq: Every evening | ORAL | Status: DC | PRN

## 2015-10-27 MED ORDER — ALUM & MAG HYDROXIDE-SIMETH 200-200-20 MG/5ML PO SUSP
30.0000 mL | Freq: Four times a day (QID) | ORAL | Status: DC | PRN
  Administered 2015-10-28 – 2015-10-29 (×2): 30 mL via ORAL
  Filled 2015-10-27 (×4): qty 30

## 2015-10-27 MED ORDER — ONDANSETRON HCL 4 MG PO TABS: 4.0000 mg | ORAL_TABLET | Freq: Four times a day (QID) | ORAL | Status: DC | PRN

## 2015-10-27 MED ORDER — SODIUM CHLORIDE 0.9 % IV SOLN: INTRAVENOUS | Status: DC

## 2015-10-27 MED ORDER — SODIUM CHLORIDE 0.45 % IV SOLN
INTRAVENOUS | Status: DC
  Administered 2015-10-27 – 2015-10-29 (×2): via INTRAVENOUS

## 2015-10-27 MED ORDER — MORPHINE SULFATE (PF) 4 MG/ML IV SOLN
4.0000 mg | Freq: Once | INTRAVENOUS | Status: AC
  Administered 2015-10-27: 4 mg via INTRAVENOUS
  Filled 2015-10-27: qty 1

## 2015-10-27 MED ORDER — ASPIRIN EC 325 MG PO TBEC
325.0000 mg | DELAYED_RELEASE_TABLET | Freq: Every day | ORAL | Status: DC
  Administered 2015-10-27 – 2015-10-29 (×3): 325 mg via ORAL
  Filled 2015-10-27 (×3): qty 1

## 2015-10-27 NOTE — ED Notes (Signed)
Pt. reports generalized abdominal pain onset Sunday , denies nausea or vomitting , no fever or chills. Pt. states history of diverticulitis.

## 2015-10-27 NOTE — H&P (Signed)
[facility] History and Physical  [name] ZOX:[id number] DOB: July 13, 1926 DOA: 10/27/2015   PCP: [name], MD   Chief Complaint: Abdominal Pain  HPI: [name] is a 82 y.o. male with a prior history of CVA in 03/2015 on ASA/Plavix, Von Willebrand's disease diagnosed 30 years ago, not seen by hematologist since, HTN, HLD, and diverticulitis and ectatic abdominal aorta, presenting to the ED with 3 day history of progressive severe abdominal pain, Intermittent, worse when lying down. This is the first time he reports these symptoms. He took Tylenol at home without relief. At this time, the pain is about 7 out of 10. No nausea or vomiting. No diarrhea or constipation. No sick contacts.He denies any bleeding issues. He denies any headaches or vision changes. He denies any shortness of breath or cough. He denies any history of cancer. Denies any trauma to the abdomen. He does not smoke. CT abdomen and pelvis was remarkable for splenic infarct. Hypercoagulable workup is in process. Cultures pending. CBC and CMET unremarkable.PT/INR pending   Review of Systems:  Constitutional:  No weight loss, night sweats, fevers, chills, fatigue HEENT: No headaches, difficulty swallowing,tooth/dental problems, sore throat Cardio-vascular:  No chest pain, orthopnea, PND, swelling in lower extremities, anasarca, dizziness, palpitations  GI:  No heartburn, chronic indigestion, abdominal pain worse in LUQ, nausea, vomiting, diarrhea, change in bowel habits, loss of appetite  Respiratory:  No shortness of breath with exertion or at rest. No excess mucus, no productive cough, No non-productive cough, No coughing up of blood. No change in color of mucus. No wheezing .No chest wall deformity  Skin:  No rash or lesions.  GU:  no dysuria, change in color of urine, no urgency or frequency. No flank pain.  Musculoskeletal:   No joint pain or swelling. No decreased range of motion. No back pain.  Psych:    No change in mood or affect. No depression or anxiety. No memory loss.  Neuro:  No change in sensation, unilateral strength, or cognitive abilities. Chronic macular degeneration  All other systems were reviewed and are negative.  Past Medical History  Diagnosis Date  . Barrett esophagus 09/2003  . GERD (gastroesophageal reflux disease)   . Hemorrhoids   . Diverticulosis   . Hyperlipidemia   . Hypertension   . Von Willebrand disease (HCC)   . PUD (peptic ulcer disease)   . Hearing loss   . GI AVM (gastrointestinal arteriovenous vascular malformation)   . History of prostatitis   . Hemorrhoids   . Macular degeneration, wet (HCC)     receiving intra-ocular injections ([facility])  . Back pain     improved with gabapentin as of 2015  . Stroke [facility])    Past Surgical History  Procedure Laterality Date  . Inguinal hernia repair  2001  . Laparoscopic appendectomy N/A 07/07/2014    Procedure: APPENDECTOMY LAPAROSCOPIC;  Surgeon: Manus Rudd, MD;  Location: [facility] OR;  Service: General;  Laterality: N/A;   Social History:  reports that he has quit smoking. His smoking use included Cigarettes. He smoked 0.00 packs per day for 36 years. He has never used smokeless tobacco. He reports that he does not drink alcohol or use illicit drugs.  Allergies  Allergen Reactions  . Atorvastatin     REACTION: muscles tense up   . Codeine Nausea And Vomiting  . Lisinopril Swelling    Lips and face swelling  . Statins Other (See Comments)    Muscle tense up  Family History  Problem Relation Age of Onset  . Heart attack Father   . Coronary artery disease Father   . Hypertension Mother   . Pancreatic cancer Mother     Died at 51.  . Cancer Mother     Pancreatic cancer  . Arthritis Sister   . Hyperlipidemia Sister   . Prostate cancer Brother   . Cancer Brother     Bone Cancer secondary to Prostate Cancer  . Diabetes Neg Hx      Prior to Admission medications   Medication Sig Start Date  End Date Taking? Authorizing Provider  acetaminophen (TYLENOL) 500 MG tablet Take 1,000 mg by mouth at bedtime.    Yes Historical Provider, MD  Alirocumab (PRALUENT) 150 MG/ML SOSY Inject 150 mg into the skin every 14 (fourteen) days. 10/07/15  Yes Joaquim Nam, MD  Bevacizumab (AVASTIN IV) Place 1 Applicatorful into both eyes See admin instructions. Right eye every 9 weeks Left eye every 6 weeks.   Yes Historical Provider, MD  cetirizine (ZYRTEC) 10 MG tablet Take 10 mg by mouth daily as needed for allergies.    Yes Historical Provider, MD  clopidogrel (PLAVIX) 75 MG tablet Take 1 tablet (75 mg total) by mouth daily. 08/21/15  Yes Joaquim Nam, MD  diltiazem (DILACOR XR) 180 MG 24 hr capsule Take 1 capsule (180 mg total) by mouth daily. 08/21/15  Yes Joaquim Nam, MD  fluticasone (FLONASE) 50 MCG/ACT nasal spray Place 2 sprays into both nostrils daily as needed for allergies. 08/21/15  Yes Joaquim Nam, MD  gabapentin (NEURONTIN) 300 MG capsule TAKE ONE CAPSULE BY MOUTH TWICE A DAY 03/25/15  Yes Joaquim Nam, MD  gemfibrozil (LOPID) 600 MG tablet Take 1 tablet (600 mg total) by mouth 2 (two) times daily. 08/12/15  Yes Joaquim Nam, MD  hydrochlorothiazide (HYDRODIURIL) 25 MG tablet Take 1 tablet (25 mg total) by mouth daily. 09/15/15  Yes Joaquim Nam, MD  Hypromellose (ARTIFICIAL TEARS OP) Apply 1 drop to eye daily as needed (itchy / watery eyes).   Yes Historical Provider, MD  loperamide (IMODIUM) 2 MG capsule Take 1 capsule (2 mg total) by mouth 3 (three) times daily as needed for diarrhea or loose stools. 07/15/14  Yes Elease Etienne, MD  metoprolol succinate (TOPROL-XL) 25 MG 24 hr tablet Take 1 tablet (25 mg total) by mouth at bedtime. 07/27/15  Yes Joaquim Nam, MD  Multiple Vitamins-Minerals (OCUVITE ADULT 50+ PO) Take 1 capsule by mouth daily.     Yes Historical Provider, MD  pantoprazole (PROTONIX) 40 MG tablet Take 1 tablet (40 mg total) by mouth 2 (two) times daily  before a meal. 07/15/14 10/27/15 Yes Elease Etienne, MD  ranitidine (ZANTAC) 150 MG tablet Take 1 tablet (150 mg total) by mouth 2 (two) times daily. 07/27/15  Yes Joaquim Nam, MD  sucralfate (CARAFATE) 1 G tablet TAKE 1 TABLET BY MOUTH BEFORE LAYING DOWN FOR NAP OR BEDTIME 11/24/14  Yes Joaquim Nam, MD   Physical Exam: Filed Vitals:   10/27/15 0545 10/27/15 0600 10/27/15 0715 10/27/15 0838  BP: 141/61 129/56 147/65 136/69  Pulse: 64 66 72 68  Temp:    97.8 F (36.6 C)  TempSrc:    Oral  Resp:   15 16  Height:    5\' 9"  (1.753 m)  Weight:    71.442 kg (157 lb 8 oz)  SpO2: 97% 96% 94% 96%    Wt Readings  from Last 3 Encounters:  10/27/15 71.442 kg (157 lb 8 oz)  08/21/15 72.689 kg (160 lb 4 oz)  07/23/15 74.05 kg (163 lb 4 oz)    General: Appears calm and comfortable at this time Eyes:  PERRL, EOMI, normal lids, iris ENT: grossly normal hearing, lips & tongue Neck: no lymphadenopathy, masses or thyromegaly Cardiovascular: regular rate and rythm, no murmurs, rubs or gallops. No lower extremity edema   Respiratory: clear to auscultation bilaterally, no wheezing, rhonhci or rales. Normal respiratory effort. Abdomen: soft, mild reproducible LUQ tenderness without guarding or rebound, normal bowel sounds Skin: no rash or induration seen on limited exam. No open lesions. Musculoskeletal:  grossly normal tone in both upper and lower extremities Psychiatric: grossly normal mood and affect, speech fluent and appropriate Neurologic: CN 2-12 grossly intact, moves all extremities in coordinated fashion.          Labs on Admission:  Basic Metabolic Panel:  Recent Labs Lab 10/27/15 0028  NA 140  K 3.5  CL 104  CO2 24  GLUCOSE 108*  BUN 18  CREATININE 1.26*  CALCIUM 9.1    Liver Function Tests:  Recent Labs Lab 10/27/15 0028  AST 20  ALT 10*  ALKPHOS 78  BILITOT 0.4  PROT 7.1  ALBUMIN 3.5    Recent Labs Lab 10/27/15 0028  LIPASE 27    CBC:  Recent  Labs Lab 10/27/15 0028  WBC 10.3  HGB 11.5*  HCT 35.4*  MCV 78.8  PLT 267    Radiological Exams on Admission: Ct Abdomen Pelvis W Contrast  10/27/2015  CLINICAL DATA:  Left-sided abdominal pain. History of diverticulitis. EXAM: CT ABDOMEN AND PELVIS WITH CONTRAST TECHNIQUE: Multidetector CT imaging of the abdomen and pelvis was performed using the standard protocol following bolus administration of intravenous contrast. CONTRAST:  100mL OMNIPAQUE IOHEXOL 300 MG/ML  SOLN COMPARISON:  08/14/2015 FINDINGS: Atelectasis in the lung bases. Moderate-sized esophageal hiatal hernia. Mild diffuse fatty infiltration of the liver. Focal wedge-shaped hypo enhancing region in the upper spleen suggesting splenic infarct. Calcified granulomas in the spleen. The gallbladder, pancreas, adrenal glands, kidneys, abdominal aorta, inferior vena cava, and retroperitoneal lymph nodes are unremarkable. Stomach, small bowel, and colon are not abnormally distended. Scattered diverticula in the colon. No free air or free fluid in the abdomen. Periumbilical hernia containing fat. Pelvis: Scattered diverticula in the sigmoid colon. No evidence of diverticulitis. Small left inguinal hernia containing fat. No pelvic mass or lymphadenopathy. Prostate gland is enlarged at 4.9 cm diameter. Bladder wall is not thickened. No free or loculated pelvic fluid collections. Surgical absence of the appendix. Degenerative changes in the spine. No destructive bone lesions. IMPRESSION: Splenic infarct. Small left inguinal hernia containing fat. Scattered diverticula in the colon without evidence of diverticulitis. Moderate-sized esophageal hiatal hernia. Electronically Signed   By: Burman NievesWilliam  Stevens M.D.   On: 10/27/2015 05:40    EKG : QTC 412, Sinus rhythm Abnormal R-wave progression, early transition  Assessment/Plan Principal Problem:   Splenic infarct Active Problems:   Von Willebrand's disease (HCC)   Essential hypertension    Diverticulitis, colon   AP (abdominal pain)   Ectatic abdominal aorta (HCC)   HLD (hyperlipidemia)   Splenic infarction   Splenic infarct. Risk factors include prior history of CVA in 03/2015 on ASA/Plavix, Von Willebrand's disease, HTN, HLD, and ectatic abdominal aorta. He was also started recently on Avastin drops for his macular degeneration.  CT abdomen and pelvis was remarkable for splenic infarct. Hypercoagulable workup is in  process. Cultures pending. Last 2 D echo in 2016 was not indicative of heart failure with EF 55% Admit for obs to Med Surg CT angio of the Aorta/ Abdomen and Pelvis  Repeat 2 D echo PT/INR pending. Add LDH Hold Avastin for now.  Continue ASA and Plavix for now. Hold Heparin for now to avoid hemorrhagic complications. Pharmacy was consulted, recommending Heme Onc consultation due to the complexity of his diagnosis. Continue supportive care for pain control, IVF at 100 cc/h NS  Hypertension BP 147/65 mmHg  Pulse 72  Temp(Src) 97.4 F (36.3 C) (Oral)  Resp 15  SpO2 94%  Continue Diltiazem, HCTZ and Toprol Monitor step down telemetry  Hyperlipidemia  Continue Lopid  History of CVA in 2016  Continue ASA/ Plavix   Von Willebrand's disease, diagnosed in 1980s, not on treatment VWP is pending  Will follow results Pharmacy is involved Heme Onc consultation with Dr. Cyndie Chime is pending  Code Status: Full Code  DVT Prophylaxis: Holding Heparin/Lovenox at this time due to VWD history, pending Heme consult Family Communication: at bedside Disposition Plan: Pending Improvement. Admitted for observation in tele bed. Expected LOS 24-48 hrs    [facility] E,PA-C [facility] [email or web address] Password [email or web address]

## 2015-10-27 NOTE — Plan of Care (Signed)
82 year old male presents with abdominal pain. CT abdomen and pelvis showed splenic infarct. At this time hypercoagulable workup and blood cultures have been sent. Patient has been admitted for further management.  Midge MiniumArshad Momin Misko.

## 2015-10-27 NOTE — ED Notes (Signed)
Patient transported to CT 

## 2015-10-27 NOTE — Progress Notes (Signed)
    [facility] has been requested to perform a transesophageal echocardiogram on 10/28/15 for splenic infarct.  After careful review of history and examination, the risks and benefits of transesophageal echocardiogram have been explained including risks of esophageal damage, perforation (1:10,000 risk), bleeding, pharyngeal hematoma as well as other potential complications associated with conscious sedation including aspiration, arrhythmia, respiratory failure and death. Alternatives to treatment were discussed, questions were answered. Patient wants to discuss with Dr. Cyndie ChimeGranfortuna before agreeing to proceed.  Have asked pt's Nurse to arrange.    Michal Strzelecki R,  10/27/2015 3:18 PM

## 2015-10-27 NOTE — Consult Note (Signed)
Referring MD: Merry Loftyavis Merrel  PCP:  Crawford GivensGraham Duncan, MD   Reason for Referral: Acute splenic infarct and a elderly man with questionable history of von Willebrand's disorder   Chief Complaint  Patient presents with  . Abdominal Pain    HPI:  Pleasant 82 year old man with a questionable history of Von Willebrand's disease. he had laboratory work done at [facility] [place] [facility] ago after his son who is now [id number] was evaluated for increased bleeding. Subsequent family member tested was a granddaughter now 80 years old and although she was told she also tested positive, she has never had any bleeding problems. The only data recorded in our chart record is from 05/23/2006 when the von Willebrand's profile was normal with factor VIII activity 76%, von Willebrand antigen 76%, and ristocetin cofactor activity 88% of control with normal von Willebrand multimers. Mr. Yetta BarreJones has had a number of minor surgical procedures including dental extractions, and appendectomy at age 82 with a concomitant hernia repair, and a remote hernia repair without any excessive bleeding.  He does have diffuse atheromatous disease and multifocal cerebrovascular stenosis and presented with dizziness and unsteady gait back in June 2016 with findings of a small infarct right post central gyrus and adjacent white matter with concern that this was an embolic stroke. He was on aspirin at the time. Aspirin subsequently discontinued and he is currently on Plavix alone. MR angiogram showed no large vessel occlusion but on the left there was an occluded left V4 segment of the posterior inferior cerebellar artery and on the right moderate to severe stenosis of the right P2 segment. Please see report for details.  He presented again in August 2016 with a dull headache, dizziness, and lightheadedness. MRI showed a new acute right frontal infarct. Stroke evaluation included an EKG which showed normal sinus rhythm,  transthoracic echocardiogram which did not show a cardiac embolic source of the stroke. However, I do not find that he ever had a transesophageal echocardiogram. He had a 30 day Holter monitor which did not show any occult arrhythmias. MRA repeated. Again no major branch occlusion or new stenosis. Chronic bilateral posterior cerebellar artery moderate to advanced stenosis. He has hypertension and hyperlipidemia but has never had an MI. He has not had problems with epistaxis, gum bleeding, hematochezia, melena, or hematuria. He has had some increased bruising on the Plavix. No spontaneous bruising.  He now presents with a 3 day history of predominantly lower abdominal pain which began on Sunday afternoon while he was watching TV. No nausea or vomiting. No diarrhea or hematochezia. Last normal bowel movement was yesterday morning after breakfast. Pain persisted and he came in for further evaluation. He has a history of diverticulitis and he thought that he was having a flare up of this. However, CT scan of the abdomen done in the emergency department shows an acute infarct in the superior spleen. The splenic vein itself appears to be patent and not thrombosed. No other obvious intra-abdominal pathology. No evidence for an acute inflammatory diverticulitis. There is a history of a mild infrarenal aortic abdominal aneurysm most recently evaluated on a 08/14/2015 CT study when it measured 29 mm and was stable to minimally increased compared with prior studies. Calcified and soft atherosclerotic plaque noted throughout the aorta.    Past Medical History  Diagnosis Date  . Barrett esophagus 09/2003  . GERD (gastroesophageal reflux disease)   . Hemorrhoids   . Diverticulosis   . Hyperlipidemia   .  Hypertension   . Von Willebrand disease (HCC)   . PUD (peptic ulcer disease)   . Hearing loss   . GI AVM (gastrointestinal arteriovenous vascular malformation)   . History of prostatitis   . Hemorrhoids   .  Macular degeneration, wet (HCC)     receiving intra-ocular injections ([facility])  . Back pain     improved with gabapentin as of 2015  . Stroke [facility])   :  Past Surgical History  Procedure Laterality Date  . Inguinal hernia repair  2000/01/06  . Laparoscopic appendectomy N/A 07/07/2014    Procedure: APPENDECTOMY LAPAROSCOPIC;  Surgeon: Manus Rudd, MD;  Location: [facility] OR;  Service: General;  Laterality: N/A;  :  . aspirin EC  325 mg Oral Daily  . clopidogrel  75 mg Oral Daily  . diltiazem  180 mg Oral Daily  . gabapentin  300 mg Oral BID  . gemfibrozil  600 mg Oral BID  . hydrochlorothiazide  25 mg Oral Daily  . metoprolol succinate  25 mg Oral QHS  :  Allergies  Allergen Reactions  . Atorvastatin     REACTION: muscles tense up   . Codeine Nausea And Vomiting  . Lisinopril Swelling    Lips and face swelling  . Statins Other (See Comments)    Muscle tense up  :  Family History  Problem Relation Age of Onset  . Heart attack Father   . Coronary artery disease Father   . Hypertension Mother   . Pancreatic cancer Mother     Died at 91.  . Cancer Mother     Pancreatic cancer  . Arthritis Sister   . Hyperlipidemia Sister   . Prostate cancer Brother   . Cancer Brother     Bone Cancer secondary to Prostate Cancer  . Diabetes Neg Hx   . Bleeding Disorder Son   :  Social History   Social History  . Marital Status: Married    Spouse Name: Jearl Soto  . Number of Children: N/A  . Years of Education: N/A   Occupational History  . Retired   .     Social History Main Topics  . Smoking status: Former Smoker -- 0.00 packs/day for 36 years    Types: Cigarettes  . Smokeless tobacco: Never Used     Comment: Quit in 01/05/1982  . Alcohol Use: No  . Drug Use: No  . Sexual Activity: Not on file   Other Topics Concern  . Not on file   Social History Narrative   6th grade. Married Jan 06, 1951 1 son, 1 dtr - died at 5 months. Work - Management consultant, Engineer, drilling, Catering manager.    Retired-fulltime. [employer] ACP - yes DNR, yes short-term mechanical ventilarion, no heroic or futile measures in face of irreversible.  :  ROS: Eyes: Currently receiving Avastin injections to control macular degeneration Throat:  Neck: Resp:  No dyspnea Cardio: No chest pain or palpitations GI: Extremities:  Lymph nodes:  Neurologic:  No headache, no new change in vision, no diplopia Skin: . Genitourinary: No hematuria. Known enlarged prostate.   Vitals: Filed Vitals:   10/27/15 0715 10/27/15 0838  BP: 147/65 136/69  Pulse: 72 68  Temp:  97.8 F (36.6 C)  Resp: 15 16    PHYSICAL EXAM: General appearance: Elderly Caucasian man uncomfortable secondary to abdominal pain HEENT: Arcus senilis. Left intraocular lens. Pupils equal round reactive to light. Pharynx no erythema or exudate or mass. He is edentulous. Lymph Nodes: No cervical supraclavicular  or axillary adenopathy Resp: Clear to auscultation resonance to percussion throughout Cardio: Regular cardiac rhythm no murmur gallop or rub Vascular: Carotids 2+. No bruits. No abdominal bruit. Dorsalis pedis pulses 2+ symmetric. Posterior tibial pulses 1+ symmetric Breasts: GI: The abdomen is soft, decreased bowel sounds, minimally distended, diffuse tenderness with rebound tenderness most prominent in the epigastric region but also in the left upper and both lower abdominal quadrants GU:  Extremities: No edema, no calf tenderness  Neurologic: He is alert and oriented 3, full extraocular movements, cranial nerves grossly normal. Motor strength 5 over 5 all extremities. Upper body coordination normal. Reflexes 1+ symmetric at the knees and at the biceps Skin:  Labs:   Recent Labs  10/27/15 0028  WBC 10.3  HGB 11.5*  HCT 35.4*  PLT 267    Recent Labs  10/27/15 0028  NA 140  K 3.5  CL 104  CO2 24  GLUCOSE 108*  BUN 18  CREATININE 1.26*  CALCIUM 9.1      Images Studies/Results: I personally reviewed this  study with the radiologist and also reviewed the MRI of the brain from June.  Ct Abdomen Pelvis W Contrast  10/27/2015  CLINICAL DATA:  Left-sided abdominal pain. History of diverticulitis. EXAM: CT ABDOMEN AND PELVIS WITH CONTRAST TECHNIQUE: Multidetector CT imaging of the abdomen and pelvis was performed using the standard protocol following bolus administration of intravenous contrast. CONTRAST:  OMNIPAQUE IOHEXOL 300 MG/ML  SOLN COMPARISON:  08/14/2015 FINDINGS: Atelectasis in the lung bases. Moderate-sized esophageal hiatal hernia. Mild diffuse fatty infiltration of the liver. Focal wedge-shaped hypo enhancing region in the upper spleen suggesting splenic infarct. Calcified granulomas in the spleen. The gallbladder, pancreas, adrenal glands, kidneys, abdominal aorta, inferior vena cava, and retroperitoneal lymph nodes are unremarkable. Stomach, small bowel, and colon are not abnormally distended. Scattered diverticula in the colon. No free air or free fluid in the abdomen. Periumbilical hernia containing fat. Pelvis: Scattered diverticula in the sigmoid colon. No evidence of diverticulitis. Small left inguinal hernia containing fat. No pelvic mass or lymphadenopathy. Prostate gland is enlarged at 4.9 cm diameter. Bladder wall is not thickened. No free or loculated pelvic fluid collections. Surgical absence of the appendix. Degenerative changes in the spine. No destructive bone lesions. IMPRESSION: Splenic infarct. Small left inguinal hernia containing fat. Scattered diverticula in the colon without evidence of diverticulitis. Moderate-sized esophageal hiatal hernia. Electronically Signed   By: Burman Nieves M.D.   On: 10/27/2015 05:40    Impression: Acute splenic infarction  Most likely etiology is atheroemboli from his aorta. Cardiac source cannot be excluded. There is no reason to suspect that he has a "hypercoagulable state" at his age. He has mild leukocytosis on the current CBC with a  normal CBC and differential count done on 08/14/2015 making an underlying myeloproliferative disorder unlikely.  Recommendation: In general, the management for splenic infarction is conservative with observation, support, and analgesics. The only indication for surgery would be extensive splenic hemorrhage with hemodynamic instability. There is no evidence for this at present. In general, we do not anticoagulate people with splenic infarction. He has a questionable history of mild von Willebrand's disorder but has had no significant clinical bleeding  despite the fact that he has been on dual antiplatelet agents. Appearance of the splenic infarct is more likely embolic than hemorrhagic. I would continue his Plavix.  I would consider doing a transesophageal echocardiogram since this was not done as part of his original stroke workup. I am not sure  how much a CT angiogram of the aorta will contribute to our management since we already know he has atherosclerotic plaque in his aorta.    Thank you for this consultation. Please call if you have any additional concerns. My impressions above discussed with Dr. Margot Ables.   Cephas Darby, MD, FACP  Hematology-Oncology/Internal Medicine [phone]  10/27/2015, 1:54 PM

## 2015-10-27 NOTE — ED Provider Notes (Signed)
CSN: [id number]     Arrival date & time 10/26/15  2359 History  By signing my name below, I, Bethel Born, attest that this documentation has been prepared under the direction and in the presence of Shon Baton, MD. Electronically Signed: Bethel Born, ED Scribe. 10/27/2015. 4:00 AM  Chief Complaint  Patient presents with  . Abdominal Pain   The history is provided by the patient. No language interpreter was used.   [name] is a 82 y.o. male with history of diverticulitis, PUD, and GERD who presents to the Emergency Department complaining of constant,  diffuse, waxing and waning, abdominal pain with onset 2 days ago. The pain is rated 10/10 in severity at worse and feels like pain that he has had in the past with diverticulitis.  Tylenol provided insufficient pain relief at home. The pain is exacerbated by laying flat but not eating. Pt denies nausea, vomiting, diarrhea, and hematochezia.   Past Medical History  Diagnosis Date  . Barrett esophagus 09/2003  . GERD (gastroesophageal reflux disease)   . Hemorrhoids   . Diverticulosis   . Hyperlipidemia   . Hypertension   . Von Willebrand disease (HCC)   . PUD (peptic ulcer disease)   . Hearing loss   . GI AVM (gastrointestinal arteriovenous vascular malformation)   . History of prostatitis   . Hemorrhoids   . Macular degeneration, wet (HCC)     receiving intra-ocular injections ([facility])  . Back pain     improved with gabapentin as of 2015  . Stroke [facility])    Past Surgical History  Procedure Laterality Date  . Inguinal hernia repair  2001  . Laparoscopic appendectomy N/A 07/07/2014    Procedure: APPENDECTOMY LAPAROSCOPIC;  Surgeon: Manus Rudd, MD;  Location: [facility] OR;  Service: General;  Laterality: N/A;   Family History  Problem Relation Age of Onset  . Heart attack Father   . Coronary artery disease Father   . Hypertension Mother   . Pancreatic cancer Mother     Died at 46.  . Cancer Mother     Pancreatic  cancer  . Arthritis Sister   . Hyperlipidemia Sister   . Prostate cancer Brother   . Cancer Brother     Bone Cancer secondary to Prostate Cancer  . Diabetes Neg Hx    Social History  Substance Use Topics  . Smoking status: Former Smoker -- 0.00 packs/day for 36 years    Types: Cigarettes  . Smokeless tobacco: Never Used     Comment: Quit in 1983  . Alcohol Use: No    Review of Systems  Constitutional: Negative for fever.  Respiratory: Negative for chest tightness and shortness of breath.   Cardiovascular: Negative for chest pain.  Gastrointestinal: Positive for abdominal pain. Negative for vomiting, diarrhea and blood in stool.  All other systems reviewed and are negative.     Allergies  Atorvastatin; Codeine; Lisinopril; and Statins  Home Medications   Prior to Admission medications   Medication Sig Start Date End Date Taking? Authorizing Provider  acetaminophen (TYLENOL) 500 MG tablet Take 1,000 mg by mouth at bedtime.    Yes Historical Provider, MD  Alirocumab (PRALUENT) 150 MG/ML SOSY Inject 150 mg into the skin every 14 (fourteen) days. 10/07/15  Yes Joaquim Nam, MD  Bevacizumab (AVASTIN IV) Place 1 Applicatorful into both eyes See admin instructions. Right eye every 9 weeks Left eye every 6 weeks.   Yes Historical Provider, MD  cetirizine (ZYRTEC) 10 MG  tablet Take 10 mg by mouth daily as needed for allergies.    Yes Historical Provider, MD  clopidogrel (PLAVIX) 75 MG tablet Take 1 tablet (75 mg total) by mouth daily. 08/21/15  Yes Joaquim Nam, MD  diltiazem (DILACOR XR) 180 MG 24 hr capsule Take 1 capsule (180 mg total) by mouth daily. 08/21/15  Yes Joaquim Nam, MD  fluticasone (FLONASE) 50 MCG/ACT nasal spray Place 2 sprays into both nostrils daily as needed for allergies. 08/21/15  Yes Joaquim Nam, MD  gabapentin (NEURONTIN) 300 MG capsule TAKE ONE CAPSULE BY MOUTH TWICE A DAY 03/25/15  Yes Joaquim Nam, MD  gemfibrozil (LOPID) 600 MG tablet Take 1  tablet (600 mg total) by mouth 2 (two) times daily. 08/12/15  Yes Joaquim Nam, MD  hydrochlorothiazide (HYDRODIURIL) 25 MG tablet Take 1 tablet (25 mg total) by mouth daily. 09/15/15  Yes Joaquim Nam, MD  Hypromellose (ARTIFICIAL TEARS OP) Apply 1 drop to eye daily as needed (itchy / watery eyes).   Yes Historical Provider, MD  loperamide (IMODIUM) 2 MG capsule Take 1 capsule (2 mg total) by mouth 3 (three) times daily as needed for diarrhea or loose stools. 07/15/14  Yes Elease Etienne, MD  metoprolol succinate (TOPROL-XL) 25 MG 24 hr tablet Take 1 tablet (25 mg total) by mouth at bedtime. 07/27/15  Yes Joaquim Nam, MD  Multiple Vitamins-Minerals (OCUVITE ADULT 50+ PO) Take 1 capsule by mouth daily.     Yes Historical Provider, MD  pantoprazole (PROTONIX) 40 MG tablet Take 1 tablet (40 mg total) by mouth 2 (two) times daily before a meal. 07/15/14 10/27/15 Yes Elease Etienne, MD  ranitidine (ZANTAC) 150 MG tablet Take 1 tablet (150 mg total) by mouth 2 (two) times daily. 07/27/15  Yes Joaquim Nam, MD  sucralfate (CARAFATE) 1 G tablet TAKE 1 TABLET BY MOUTH BEFORE LAYING DOWN FOR NAP OR BEDTIME 11/24/14  Yes Joaquim Nam, MD   BP 113/46 mmHg  Pulse 71  Temp(Src) 97.4 F (36.3 C) (Oral)  Resp 16  SpO2 95% Physical Exam  Constitutional: He is oriented to person, place, and time. No distress.  Elderly, uncomfortable appearing, no acute distress  HENT:  Head: Normocephalic and atraumatic.  Cardiovascular: Normal rate, regular rhythm and normal heart sounds.   No murmur heard. Pulmonary/Chest: Effort normal and breath sounds normal. No respiratory distress. He has no wheezes.  Abdominal: Soft. Bowel sounds are normal. There is tenderness. There is guarding. There is no rebound.  Left upper and lower quadrant tenderness to palpation with voluntary guarding, no signs of peritonitis  Musculoskeletal: He exhibits no edema.  Neurological: He is alert and oriented to person, place,  and time.  Skin: Skin is warm and dry.  Psychiatric: He has a normal mood and affect.  Nursing note and vitals reviewed.   ED Course  Procedures (including critical care time) DIAGNOSTIC STUDIES: Oxygen Saturation is 95% on RA,  normal by my interpretation.    COORDINATION OF CARE: 3:58 AM Discussed treatment plan which includes lab work, CT A/P, and pain management with pt at bedside and pt agreed to plan.  Labs Review Labs Reviewed  COMPREHENSIVE METABOLIC PANEL - Abnormal; Notable for the following:    Glucose, Bld 108 (*)    Creatinine, Ser 1.26 (*)    ALT 10 (*)    GFR calc non Af Amer 49 (*)    GFR calc Af Amer 57 (*)  All other components within normal limits  CBC - Abnormal; Notable for the following:    Hemoglobin 11.5 (*)    HCT 35.4 (*)    MCH 25.6 (*)    All other components within normal limits  CULTURE, BLOOD (ROUTINE X 2)  CULTURE, BLOOD (ROUTINE X 2)  LIPASE, BLOOD  URINALYSIS, ROUTINE W REFLEX MICROSCOPIC (NOT [facility])  ANTITHROMBIN III  PROTEIN C ACTIVITY  PROTEIN C, TOTAL  PROTEIN S ACTIVITY  PROTEIN S, TOTAL  LUPUS ANTICOAGULANT PANEL  BETA-2-GLYCOPROTEIN I ABS, IGG/M/A  HOMOCYSTEINE  FACTOR 5 LEIDEN  PROTHROMBIN GENE MUTATION  CARDIOLIPIN ANTIBODIES, IGG, IGM, IGA    Imaging Review Ct Abdomen Pelvis W Contrast  10/27/2015  CLINICAL DATA:  Left-sided abdominal pain. History of diverticulitis. EXAM: CT ABDOMEN AND PELVIS WITH CONTRAST TECHNIQUE: Multidetector CT imaging of the abdomen and pelvis was performed using the standard protocol following bolus administration of intravenous contrast. CONTRAST:  100mL OMNIPAQUE IOHEXOL 300 MG/ML  SOLN COMPARISON:  08/14/2015 FINDINGS: Atelectasis in the lung bases. Moderate-sized esophageal hiatal hernia. Mild diffuse fatty infiltration of the liver. Focal wedge-shaped hypo enhancing region in the upper spleen suggesting splenic infarct. Calcified granulomas in the spleen. The gallbladder, pancreas, adrenal  glands, kidneys, abdominal aorta, inferior vena cava, and retroperitoneal lymph nodes are unremarkable. Stomach, small bowel, and colon are not abnormally distended. Scattered diverticula in the colon. No free air or free fluid in the abdomen. Periumbilical hernia containing fat. Pelvis: Scattered diverticula in the sigmoid colon. No evidence of diverticulitis. Small left inguinal hernia containing fat. No pelvic mass or lymphadenopathy. Prostate gland is enlarged at 4.9 cm diameter. Bladder wall is not thickened. No free or loculated pelvic fluid collections. Surgical absence of the appendix. Degenerative changes in the spine. No destructive bone lesions. IMPRESSION: Splenic infarct. Small left inguinal hernia containing fat. Scattered diverticula in the colon without evidence of diverticulitis. Moderate-sized esophageal hiatal hernia. Electronically Signed   By: Burman NievesWilliam  Stevens M.D.   On: 10/27/2015 05:40   I have personally reviewed and evaluated these images and lab results as part of my medical decision-making.   EKG Interpretation   Date/Time:  Tuesday October 27 2015 06:07:14 EST Ventricular Rate:  66 PR Interval:  198 QRS Duration: 98 QT Interval:  393 QTC Calculation: 412 R Axis:   48 Text Interpretation:  Sinus rhythm Abnormal R-wave progression, early  transition Confirmed by Asser Lucena  MD, Rehman Levinson ([id number]) on 10/27/2015 6:21:33  AM Also confirmed by Wilkie AyeHORTON  MD, Arne Schlender ([id number]), editor WALKER, CCT,  SANDRA ([id number])  on 10/27/2015 6:59:28 AM      MDM   Final diagnoses:  Splenic infarct    Patient presents with left sided abdominal pain. History of diverticulitis. Uncomfortable appearing but nontoxic on exam. Has pain with voluntary guarding.  Lab work is at the patient's baseline. CT scan negative for diverticulitis but does show a splenic infarct. No history of atrial fibrillation. Currently in sinus rhythm. Has a history of von Willebrand's disease but no other hypercoagulable states.  Is on Plavix for CVA precaution. Discuss with Dr. Dwain SarnaWakefield on call for surgery. Nothing to do for a surgery standpoint. Pain control and hypercoagulable workup. Discussed with Dr. Toniann FailKakrakandy.  She will be admitted to telemetry. Hypercoagulable panel ordered.  I personally performed the services described in this documentation, which was scribed in my presence. The recorded information has been reviewed and is accurate.    Shon Batonourtney F Autymn Omlor, MD 10/27/15 [phone]

## 2015-10-28 ENCOUNTER — Encounter (HOSPITAL_COMMUNITY)
Admission: EM | Disposition: A | Discharge status: Home / Self Care | Payer: Self-pay | Source: Home / Self Care | Attending: Internal Medicine

## 2015-10-28 ENCOUNTER — Encounter (HOSPITAL_COMMUNITY): Payer: Self-pay

## 2015-10-28 ENCOUNTER — Observation Stay (HOSPITAL_BASED_OUTPATIENT_CLINIC_OR_DEPARTMENT_OTHER)
Admit: 2015-10-28 | Discharge: 2015-10-28 | Disposition: A | Discharge status: Home / Self Care | Payer: Medicare HMO | Attending: Physician Assistant | Admitting: Physician Assistant

## 2015-10-28 DIAGNOSIS — D735 Infarction of spleen: Secondary | ICD-10-CM | POA: Diagnosis not present

## 2015-10-28 DIAGNOSIS — I10 Essential (primary) hypertension: Secondary | ICD-10-CM

## 2015-10-28 DIAGNOSIS — D680 Von Willebrand's disease: Secondary | ICD-10-CM | POA: Diagnosis not present

## 2015-10-28 DIAGNOSIS — D72829 Elevated white blood cell count, unspecified: Secondary | ICD-10-CM | POA: Diagnosis not present

## 2015-10-28 HISTORY — PX: TEE WITHOUT CARDIOVERSION: SHX5443

## 2015-10-28 LAB — LUPUS ANTICOAGULANT PANEL
DRVVT: 43.8 s (ref 0.0–44.0)
PTT Lupus Anticoagulant: 39.4 s (ref 0.0–43.6)

## 2015-10-28 LAB — BASIC METABOLIC PANEL
Anion gap: 10 (ref 5–15)
BUN: 19 mg/dL (ref 6–20)
CO2: 28 mmol/L (ref 22–32)
Calcium: 8.5 mg/dL — ABNORMAL LOW (ref 8.9–10.3)
Chloride: 99 mmol/L — ABNORMAL LOW (ref 101–111)
Creatinine, Ser: 1.37 mg/dL — ABNORMAL HIGH (ref 0.61–1.24)
GFR calc Af Amer: 51 mL/min — ABNORMAL LOW (ref >60)
GFR calc non Af Amer: 44 mL/min — ABNORMAL LOW (ref >60)
Glucose, Bld: 115 mg/dL — ABNORMAL HIGH (ref 65–99)
Potassium: 3.4 mmol/L — ABNORMAL LOW (ref 3.5–5.1)
Sodium: 137 mmol/L (ref 135–145)

## 2015-10-28 LAB — CBC
HCT: 34 % — ABNORMAL LOW (ref 39.0–52.0)
Hemoglobin: 10.3 g/dL — ABNORMAL LOW (ref 13.0–17.0)
MCH: 24.5 pg — ABNORMAL LOW (ref 26.0–34.0)
MCHC: 30.3 g/dL (ref 30.0–36.0)
MCV: 81 fL (ref 78.0–100.0)
Platelets: 277 10*3/uL (ref 150–400)
RBC: 4.2 MIL/uL — ABNORMAL LOW (ref 4.22–5.81)
RDW: 14.8 % (ref 11.5–15.5)
WBC: 11 10*3/uL — ABNORMAL HIGH (ref 4.0–10.5)

## 2015-10-28 LAB — PROTEIN S, TOTAL: Protein S Ag, Total: 121 % (ref 60–150)

## 2015-10-28 LAB — PROTEIN S ACTIVITY: Protein S Activity: 84 % (ref 63–140)

## 2015-10-28 LAB — PROTIME-INR
INR: 1.05 (ref 0.00–1.49)
Prothrombin Time: 13.9 seconds (ref 11.6–15.2)

## 2015-10-28 LAB — PROTEIN C ACTIVITY: Protein C Activity: 127 % (ref 73–180)

## 2015-10-28 LAB — HOMOCYSTEINE: Homocysteine: 17.1 umol/L — ABNORMAL HIGH (ref 0.0–15.0)

## 2015-10-28 SURGERY — ECHOCARDIOGRAM, TRANSESOPHAGEAL
Anesthesia: Moderate Sedation

## 2015-10-28 MED ORDER — FENTANYL CITRATE (PF) 100 MCG/2ML IJ SOLN
INTRAMUSCULAR | Status: AC
  Filled 2015-10-28: qty 2

## 2015-10-28 MED ORDER — FENTANYL CITRATE (PF) 100 MCG/2ML IJ SOLN
INTRAMUSCULAR | Status: DC | PRN
  Administered 2015-10-28: 25 ug via INTRAVENOUS

## 2015-10-28 MED ORDER — MIDAZOLAM HCL 10 MG/2ML IJ SOLN
INTRAMUSCULAR | Status: DC | PRN
  Administered 2015-10-28 (×2): 1 mg via INTRAVENOUS

## 2015-10-28 MED ORDER — MIDAZOLAM HCL 5 MG/ML IJ SOLN
INTRAMUSCULAR | Status: AC
  Filled 2015-10-28: qty 2

## 2015-10-28 MED ORDER — BUTAMBEN-TETRACAINE-BENZOCAINE 2-2-14 % EX AERO
INHALATION_SPRAY | CUTANEOUS | Status: DC | PRN
  Administered 2015-10-28: 2 via TOPICAL

## 2015-10-28 MED ORDER — SODIUM CHLORIDE 0.9 % IV SOLN
INTRAVENOUS | Status: DC
Start: 2015-10-28 — End: 2015-10-28
  Administered 2015-10-28: 500 mL via INTRAVENOUS

## 2015-10-28 MED ORDER — POTASSIUM CHLORIDE 10 MEQ/100ML IV SOLN
INTRAVENOUS | Status: AC
  Filled 2015-10-28: qty 100

## 2015-10-28 MED ORDER — SODIUM CHLORIDE 0.9 % IV SOLN: INTRAVENOUS | Status: DC

## 2015-10-28 MED ORDER — POTASSIUM CHLORIDE 10 MEQ/100ML IV SOLN
10.0000 meq | INTRAVENOUS | Status: AC
  Administered 2015-10-28 (×3): 10 meq via INTRAVENOUS
  Filled 2015-10-28 (×2): qty 100

## 2015-10-28 NOTE — Progress Notes (Signed)
  Echocardiogram 2D Echocardiogram has been performed.  Tye SavoyCasey N Shayley Medlin 10/28/2015, 1:46 PM

## 2015-10-28 NOTE — Progress Notes (Signed)
In pre-op pt was saturating at 90% on RA. Placed pt on 2L/[place] and sats are 98%. Pt states he is sleepy.

## 2015-10-28 NOTE — Care Management Obs Status (Signed)
MEDICARE OBSERVATION STATUS NOTIFICATION   Patient Details  Name: [name] MRN: [id number] Date of Birth: 02-Oct-1925   Medicare Observation Status Notification Given:  Yes, wife signed    Epifanio LeschesCole, Sondos Wolfman Hudson, RN 10/28/2015, 5:48 PM

## 2015-10-28 NOTE — Progress Notes (Signed)
[facility] PROGRESS NOTE  CEDAR DITULLIO ZOX:[id number] DOB: [birth year] DOA: 10/27/2015  PCP: [name], MD  Brief HPI: 82 year old Caucasian male with a past medical history of Barrett's esophagus, GERD, hyperlipidemia, von Willebrand's disease, peptic ulcer disease who presented with abdominal pain and was found to have a splenic infarct. Patient was hospitalized for further management.  Past medical history:  Past Medical History  Diagnosis Date  . Barrett esophagus 09/2003  . GERD (gastroesophageal reflux disease)   . Hemorrhoids   . Diverticulosis   . Hyperlipidemia   . Hypertension   . Von Willebrand disease (HCC)   . PUD (peptic ulcer disease)   . Hearing loss   . GI AVM (gastrointestinal arteriovenous vascular malformation)   . History of prostatitis   . Hemorrhoids   . Macular degeneration, wet (HCC)     receiving intra-ocular injections ([facility])  . Back pain     improved with gabapentin as of 2015  . Stroke [facility])     Consultants: Hematology, cardiology  Procedures:  TEE  Antibiotics: None  Subjective: Patient states that his abdominal pain is well controlled currently. Denies any nausea or vomiting. No diarrhea. No chest pain or shortness of breath.  Objective: Vital Signs  Filed Vitals:   10/27/15 0838 10/27/15 1445 10/27/15 2228 10/28/15 0538  BP: 136/69 144/69 148/68 138/61  Pulse: 68 75 75 76  Temp: 97.8 F (36.6 C) 98.4 F (36.9 C) 98.7 F (37.1 C) 99.3 F (37.4 C)  TempSrc: Oral Oral Oral Oral  Resp: Height:  (1.753 m)     Weight: 71.442 kg (157 lb 8 oz)     SpO2: 96% 94% 97% 92%    Intake/Output Summary (Last 24 hours) at 10/28/15 1013 Last data filed at 10/28/15 0616  Gross per 24 hour  Intake    860 ml  Output    990 ml  Net   -130 ml   Filed Weights   10/27/15 0838  Weight: 71.442 kg (157 lb 8 oz)    General appearance: alert, cooperative, appears stated age and no distress Resp: clear to  auscultation bilaterally Cardio: regular rate and rhythm, S1, S2 normal, no murmur, click, rub or gallop GI: soft, non-tender; bowel sounds normal; no masses,  no organomegaly Extremities: extremities normal, atraumatic, no cyanosis or edema Neurologic: Hard of hearing. However, no other focal deficits.  Lab Results:  Basic Metabolic Panel:  Recent Labs Lab 10/27/15 0028 10/28/15 0430  NA 140 137  K 3.5 3.4*  CL 104 99*  CO2 24 28  GLUCOSE 108* 115*  BUN 18 19  CREATININE 1.26* 1.37*  CALCIUM 9.1 8.5*   Liver Function Tests:  Recent Labs Lab 10/27/15 0028  AST 20  ALT 10*  ALKPHOS 78  BILITOT 0.4  PROT 7.1  ALBUMIN 3.5    Recent Labs Lab 10/27/15 0028  LIPASE 27   CBC:  Recent Labs Lab 10/27/15 0028 10/28/15 0430  WBC 10.3 11.0*  HGB 11.5* 10.3*  HCT 35.4* 34.0*  MCV 78.8 81.0  PLT 267 277     Studies/Results: Ct Abdomen Pelvis W Contrast  10/27/2015  CLINICAL DATA:  Left-sided abdominal pain. History of diverticulitis. EXAM: CT ABDOMEN AND PELVIS WITH CONTRAST TECHNIQUE: Multidetector CT imaging of the abdomen and pelvis was performed using the standard protocol following bolus administration of intravenous contrast. CONTRAST:  OMNIPAQUE IOHEXOL 300 MG/ML  SOLN COMPARISON:  08/14/2015 FINDINGS: Atelectasis in the lung bases. Moderate-sized  esophageal hiatal hernia. Mild diffuse fatty infiltration of the liver. Focal wedge-shaped hypo enhancing region in the upper spleen suggesting splenic infarct. Calcified granulomas in the spleen. The gallbladder, pancreas, adrenal glands, kidneys, abdominal aorta, inferior vena cava, and retroperitoneal lymph nodes are unremarkable. Stomach, small bowel, and colon are not abnormally distended. Scattered diverticula in the colon. No free air or free fluid in the abdomen. Periumbilical hernia containing fat. Pelvis: Scattered diverticula in the sigmoid colon. No evidence of diverticulitis. Small left inguinal hernia  containing fat. No pelvic mass or lymphadenopathy. Prostate gland is enlarged at 4.9 cm diameter. Bladder wall is not thickened. No free or loculated pelvic fluid collections. Surgical absence of the appendix. Degenerative changes in the spine. No destructive bone lesions. IMPRESSION: Splenic infarct. Small left inguinal hernia containing fat. Scattered diverticula in the colon without evidence of diverticulitis. Moderate-sized esophageal hiatal hernia. Electronically Signed   By: Burman NievesWilliam  Stevens M.D.   On: 10/27/2015 05:40    Medications:  Scheduled: . aspirin EC  325 mg Oral Daily  . clopidogrel  75 mg Oral Daily  . diltiazem  180 mg Oral Daily  . gabapentin  300 mg Oral BID  . gemfibrozil  600 mg Oral BID  . hydrochlorothiazide  25 mg Oral Daily  . metoprolol succinate  25 mg Oral QHS   Continuous: . sodium chloride 75 mL/hr at 10/28/15 1019   VWU:JWJXPRN:alum & mag hydroxide-simeth, morphine injection, ondansetron **OR** ondansetron (ZOFRAN) IV, senna-docusate  Assessment/Plan:  Principal Problem:   Splenic infarct Active Problems:   Von Willebrand's disease (HCC)   Essential hypertension   Diverticulitis, colon   AP (abdominal pain)   Ectatic abdominal aorta (HCC)   HLD (hyperlipidemia)   Splenic infarction    Splenic infarct Patient has a prior history of CVA in 03/2015 on ASA/Plavix, Von Willebrand's disease, HTN, HLD, and ectatic abdominal aorta. CT abdomen and pelvis was remarkable for splenic infarct. Hypercoagulable workup is in process. Cultures pending. Last 2 D echo in 2016 was not indicative of heart failure with EF 55%. Patient noted to have extensive atherosclerosis on his CT scan. This is the most likely reason for his infarct. Hematology is following. TEE has been recommended, which is being done today. Continue Plavix and aspirin. Further management plan after TEE results are available.  Essential Hypertension Continue Diltiazem, HCTZ and Toprol.   Hyperlipidemia    Continue Lopid  History of CVA in 2016  Continue Plavix. Not on aspirin at home.  Von Willebrand's disease, diagnosed in 151980s, not on treatment VWP is pending. Dr. Cyndie ChimeGranfortuna is following   DVT Prophylaxis: SCDs    Code Status: Full code  Family Communication: Discussed with the patient and his wife  Disposition Plan: Await TEE and further input from hematology.     LOS: 1 day   [facility] Contin[facility] Care HospitalKRISHNAN,Jasen Hartstein  [facility] Pager [phone] 10/28/2015, 10:13 AM  If 7PM-7AM, please contact night-coverage at [email or web address], password [email or web address]

## 2015-10-28 NOTE — CV Procedure (Signed)
Procedure: TEE  Indication: Splenic infarct, assess for source of embolus.   Sedation: Versed 2 mg IV, 25 mcg IV  Findings: Please see echo section for full report.  Normal LV size and thickness.  EF 55%.  Normal RV size and systolic function.  Trileaflet aortic valve with no stenosis or regurgitation.  Trivial MR, trivial TR.  Normal left atrial size, no LAA thrombus.  Normal right atrial size with Chiari network noted.  Negative bubble study, no evidence for PFO or ASD.  There was grade IV plaque in the descending thoracic aorta.   Only possible source of embolus noted for splenic infarct would be extensive descending thoracic aorta plaque.   Marca AnconaDalton Erick Murin 10/28/2015

## 2015-10-28 NOTE — Progress Notes (Signed)
Patient ID: [name], male   DOB: 12/24/1925, 82 y.o.   MRN: [id number] Hematology: Dramatically less tender on exam today. No significant point tenderness or rebound. Bowel sounds absent. No abdominal or femoral bruits. No BM yet today. He vomited this AM.  He thought it was blood. Nursing staff did not confirm. Impression: Acute splenic infarct 6 months following 2 strokes. Presumably embolic. I discussed reason to do TEE to exclude cardiac embolic source and R/O PFO/ASD. If study positive, it would change management and I would rec change to an anticoagulant & not just an antiplatelet agent.He agrees to proceed.  I am repeating the Von Willebrand profile. This info also important in management if we are considering adding back ASA as risk of bleeding would be significantly increased in a VWF patient. 

## 2015-10-28 NOTE — Progress Notes (Deleted)
  Echocardiogram Echocardiogram Transesophageal has been performed.  Tye SavoyCasey N Hilja Kintzel 10/28/2015, 1:46 PM

## 2015-10-28 NOTE — Progress Notes (Signed)
  Echocardiogram Echocardiogram Transesophageal has been performed.  Martin SavoyCasey N Malky Fields 10/28/2015, 1:47 PM

## 2015-10-28 NOTE — H&P (View-Only) (Signed)
Patient ID: Martin [name], male   DOB: April 11, 1926, 82 y.o.   MRN: [id number] Hematology: Dramatically less tender on exam today. No significant point tenderness or rebound. Bowel sounds absent. No abdominal or femoral bruits. No BM yet today. He vomited this AM.  He thought it was blood. Nursing staff did not confirm. Impression: Acute splenic infarct 6 months following 2 strokes. Presumably embolic. I discussed reason to do TEE to exclude cardiac embolic source and R/O PFO/ASD. If study positive, it would change management and I would rec change to an anticoagulant & not just an antiplatelet agent.He agrees to proceed.  I am repeating the Von Willebrand profile. This info also important in management if we are considering adding back ASA as risk of bleeding would be significantly increased in a VWF patient.

## 2015-10-28 NOTE — Interval H&P Note (Signed)
History and Physical Interval Note:  10/28/2015 1:15 PM  Martin [name]  has presented today for surgery, with the diagnosis of SPLENIC INFARCT   The various methods of treatment have been discussed with the patient and family. After consideration of risks, benefits and other options for treatment, the patient has consented to  Procedure(s): TRANSESOPHAGEAL ECHOCARDIOGRAM (TEE) (N/A) as a surgical intervention .  The patient's history has been reviewed, patient examined, no change in status, stable for surgery.  I have reviewed the patient's chart and labs.  Questions were answered to the patient's satisfaction.     Serapio Edelson [employer]

## 2015-10-29 ENCOUNTER — Encounter (HOSPITAL_COMMUNITY): Payer: Self-pay | Admitting: Cardiology

## 2015-10-29 DIAGNOSIS — D735 Infarction of spleen: Secondary | ICD-10-CM | POA: Diagnosis not present

## 2015-10-29 LAB — CARDIOLIPIN ANTIBODIES, IGG, IGM, IGA
Anticardiolipin IgA: <9 APL U/mL (ref 0–11)
Anticardiolipin IgG: <9 GPL U/mL (ref 0–14)
Anticardiolipin IgM: <9 MPL U/mL (ref 0–12)

## 2015-10-29 LAB — BASIC METABOLIC PANEL
Anion gap: 10 (ref 5–15)
BUN: 21 mg/dL — ABNORMAL HIGH (ref 6–20)
CO2: 26 mmol/L (ref 22–32)
Calcium: 8.3 mg/dL — ABNORMAL LOW (ref 8.9–10.3)
Chloride: 102 mmol/L (ref 101–111)
Creatinine, Ser: 1.21 mg/dL (ref 0.61–1.24)
GFR calc Af Amer: 60 mL/min — ABNORMAL LOW (ref >60)
GFR calc non Af Amer: 52 mL/min — ABNORMAL LOW (ref >60)
Glucose, Bld: 90 mg/dL (ref 65–99)
Potassium: 3.5 mmol/L (ref 3.5–5.1)
Sodium: 138 mmol/L (ref 135–145)

## 2015-10-29 LAB — COAG STUDIES INTERP REPORT: PDF Image: 0

## 2015-10-29 LAB — PROTEIN C, TOTAL: Protein C, Total: 89 % (ref 60–150)

## 2015-10-29 LAB — CBC
HCT: 32.8 % — ABNORMAL LOW (ref 39.0–52.0)
Hemoglobin: 10.2 g/dL — ABNORMAL LOW (ref 13.0–17.0)
MCH: 25.3 pg — ABNORMAL LOW (ref 26.0–34.0)
MCHC: 31.1 g/dL (ref 30.0–36.0)
MCV: 81.4 fL (ref 78.0–100.0)
Platelets: 280 10*3/uL (ref 150–400)
RBC: 4.03 MIL/uL — ABNORMAL LOW (ref 4.22–5.81)
RDW: 14.7 % (ref 11.5–15.5)
WBC: 8 10*3/uL (ref 4.0–10.5)

## 2015-10-29 LAB — VON WILLEBRAND PANEL
Coagulation Factor VIII: 273 % — ABNORMAL HIGH (ref 57–163)
Ristocetin Co-factor, Plasma: 145 % (ref 50–200)
Von Willebrand Antigen, Plasma: 172 % (ref 50–200)

## 2015-10-29 LAB — BETA-2-GLYCOPROTEIN I ABS, IGG/M/A
Beta-2 Glyco I IgG: <9 GPI IgG units (ref 0–20)
Beta-2-Glycoprotein I IgA: <9 GPI IgA units (ref 0–25)
Beta-2-Glycoprotein I IgM: <9 GPI IgM units (ref 0–32)

## 2015-10-29 MED ORDER — ASPIRIN EC 81 MG PO TBEC: 81.0000 mg | DELAYED_RELEASE_TABLET | Freq: Every day | ORAL | Status: DC

## 2015-10-29 MED ORDER — OXYCODONE HCL 5 MG PO TABS: 5.0000 mg | ORAL_TABLET | Freq: Four times a day (QID) | ORAL | Status: DC | PRN

## 2015-10-29 NOTE — Progress Notes (Signed)
Pt given discharge instructions, prescriptions, and care notes. Pt verbalized understanding AEB no further questions or concerns at this time. IV was discontinued, no redness, pain, or swelling noted at this time. Telemetry discontinued and Centralized Telemetry was notified. Pt left the floor via ambulation with staff in stable condition. 

## 2015-10-29 NOTE — Progress Notes (Signed)
Patient ID: Martin [name], male   DOB: 25-Jul-1926, 82 y.o.   MRN: [id number] Hematology: TEE normal. No cardio-embolic source for splenic infarct. Presumed origin is atherosclerotic plaque from aorta. Von Willebrand panel: all levels high normal with factor VIII activity higher than highest control value - likely as an acute phase reactant in association with the acute clot. Hypercoag panel normal as expected given low pre-test probability. Mild elevation of homocysteine is irrelevant. Impression: 1. Acute splenic infarct Clinical sxs improving 2. No evidence that he has Von Willebrand's disease Recommend: I would add 81 mg ASA back to his Plavix. Overall risk/benefit in favor of this.

## 2015-10-29 NOTE — Discharge Instructions (Signed)
Atherosclerosis  Atherosclerosis, or hardening of the arteries, is the buildup of plaque within the major arteries in the body. Plaque is made up of fats (lipids), cholesterol, calcium, and fibrous tissue. Plaque can narrow or block blood flow within an artery. Plaque can break off and cause damage to the affected organ. Plaque can also "rupture." When plaque ruptures within an artery, a clot can form, causing a sudden (acute) blockage of the artery. Untreated atherosclerosis can cause serious health problems or death.   RISK FACTORS  · High cholesterol levels.  · Smoking.  · Obesity.  · Lack of activity or exercise.  · Eating a diet high in saturated fat.  · Family history.  · Diabetes.  SIGNS AND SYMPTOMS   Symptoms of atherosclerosis can occur when blood flow to an artery is slowed or blocked. Severity and onset of symptoms depends on how extensive the narrowing or blockage is. A sudden plaque rupture can bring immediate, life-threatening symptoms. Atherosclerosis can affect different arteries in the body, for example:  · Coronary arteries. The coronary arteries supply the heart with blood. When the coronary arteries are narrowed or blocked from atherosclerosis, this is known as coronary artery disease (CAD). CAD can cause a heart attack. Common heart attack symptoms include:    Chest pain or pain that radiates to the neck, arm, jaw, or in the upper, middle back (mid-scapular pain).    Shortness of breath without cause.    Profuse sweating while at rest.    Irregular heartbeats.    Nausea or gastrointestinal upset.  · Carotid arteries. The carotid arteries supply the brain with blood. They are located on each side of your neck. When blood flow to these arteries is slowed or blocked, a transient ischemic attack (TIA) or stroke can occur. A TIA is considered a "mini-stroke" or "warning stroke." TIA symptoms are the same as stroke symptoms, but they are temporary and last less than 24 hours. A stroke can cause  permanent damage or death. Common TIA and stroke symptoms include:    Sudden numbness or weakness to one side of your body, such as the face, arm, or leg.    Sudden confusion or trouble speaking or understanding.    Sudden trouble seeing out of one or both eyes.    Sudden trouble walking, loss of balance, or dizziness.    Sudden, severe headache with no known cause.  · Arteries in the legs. When arteries in the lower legs become narrowed or blocked, this is known as peripheral vascular disease (PVD). PVD can cause a symptom called claudication. Claudication is pain or a burning feeling in your legs when walking or exercising and usually goes away with rest. Very severe PVD can cause pain in your legs while at rest.  · Renal arteries. The renal arteries supply the kidneys with blood. Blockage of the renal arteries can cause a decline in kidney function or high blood pressure (hypertension).  · Gastrointestinal arteries (mesenteric circulation). Abdominal pain may occur after eating.  DIAGNOSIS   Your health care provider may perform the following tests to diagnose atherosclerosis:  · Blood tests.  · Stress test.  · Echocardiogram.  · Nuclear scan.  · Ankle/brachial index.  · Ultrasonography.  · Computed tomography (CT) scan.  · Angiography.  TREATMENT   Atherosclerosis treatment includes the following:  · Lifestyle changes such as:    Quitting smoking. Your health care provider can help you with smoking cessation.    Eating a diet low   in saturated fat. A registered dietitian can educate you on healthy food options, such as helping you understand the difference between good fat and bad fat.    Following an exercise program approved by your health care provider.    Maintaining a healthy weight. Lose weight as approved by your health care provider.  · Have your cholesterol levels checked as directed by your health care provider.  · Medicines. Cholesterol medicines can help slow or stop the progression of  atherosclerosis.  · Different procedural or surgical interventions to treat atherosclerosis include:    Balloon angioplasty. The technical name for balloon angioplasty is percutaneous transluminal angioplasty (PTA). In this procedure, a catheter with a small balloon at the tip is inserted through the blocked or narrowed artery. The balloon is then inflated. When the balloon is inflated, the fatty plaque is compressed against the artery wall, allowing better blood flow within the artery.    Balloon angioplasty and stenting. In this procedure, balloon angioplasty is combined with a stenting procedure. A stent is a small, metal mesh tube that keeps the artery open. After the artery is opened up by the balloon technique, the stent is then deployed. The stent is permanent.    Open heart surgery or bypass surgery. To perform this type of surgery, a healthy vessel is first "harvested" from either the leg or arm. The harvested vessel is then used to "bypass" the blocked atherosclerotic vessel so new blood flow can be established.    Atherectomy. Atherectomy is a procedure that uses a catheter with a sharp blade to remove plaque from an artery. A chamber in the catheter collects the plaque.    Endarterectomy. An endarterectomy is a surgical procedure where a surgeon removes plaque from an artery.    Amputation. When blockages in the lower legs are very severe and circulation cannot be restored, amputation may be required.  SEEK IMMEDIATE MEDICAL CARE IF:  · You are having heart attack symptoms, such as:    Chest pain or pain that radiates to the neck, arm, jaw, or in the upper, middle back (mid-scapular pain).    Shortness of breath without cause.    Profuse sweating while at rest.    Irregular heartbeats.    Nausea or gastrointestinal upset.  · You are having stroke symptoms, such as sudden:    Numbness or weakness to one side of your body, such as the face, arm, or leg.    Confusion or trouble speaking or understanding.      Trouble seeing out of one or both eyes.    Trouble walking, loss of balance, or dizziness.    Severe headache with no known cause.  · Your hands or feet are bluish, cold, or you have pain in them.  · You have bad abdominal pain after eating.  Symptoms of heart attack or stroke may represent a serious problem that is an emergency. Do not wait to see if the symptoms will go away. Get medical help right away. Call your local emergency services (911 in the [place] Do not drive yourself to the hospital.     This information is not intended to replace advice given to you by your health care provider. Make sure you discuss any questions you have with your health care provider.     Document Released: 10/29/2003 Document Revised: 08/29/2014 Document Reviewed: 10/11/2011  [employer]vier Interactive Patient Education ©2016 [employer]

## 2015-10-29 NOTE — Discharge Summary (Signed)
[facility]  Physician Discharge Summary   Patient ID: [name] MRN: [id number] DOB/AGE: [birth year] 82 y.o.  Admit date: 10/27/2015 Discharge date: 10/29/2015  PCP: [name], MD  DISCHARGE DIAGNOSES:  Principal Problem:   Splenic infarct Active Problems:   Essential hypertension   Diverticulitis, colon   AP (abdominal pain)   Ectatic abdominal aorta (HCC)   HLD (hyperlipidemia)   Splenic infarction   RECOMMENDATIONS FOR OUTPATIENT FOLLOW UP: 1. Follow-up with PCP next week 2. Low-dose aspirin added to Plavix   DISCHARGE CONDITION: fair  Diet recommendation: Heart healthy  Filed Weights   10/27/15 0838 10/28/15 1211  Weight: 71.442 kg (157 lb 8 oz) 71.215 kg (157 lb)    INITIAL HISTORY: 82 year old Caucasian male with a past medical history of Barrett's esophagus, GERD, hyperlipidemia, von Willebrand's disease, peptic ulcer disease who presented with abdominal pain and was found to have a splenic infarct. Patient was hospitalized for further management.  Consultants: Hematology, cardiology  Procedures:  TEE "Findings: Please see echo section for full report. Normal LV size and thickness. EF 55%. Normal RV size and systolic function. Trileaflet aortic valve with no stenosis or regurgitation. Trivial MR, trivial TR. Normal left atrial size, no LAA thrombus. Normal right atrial size with Chiari network noted. Negative bubble study, no evidence for PFO or ASD. There was grade IV plaque in the descending thoracic aorta. "  HOSPITAL COURSE:   Splenic infarct Patient has a prior history of CVA in 03/2015 on ASA/Plavix, Von Willebrand's disease, HTN, HLD, and ectatic abdominal aorta. CT abdomen and pelvis was remarkable for splenic infarct. Hypercoagulable workup is in process. Last 2 D echo in 2016 was not indicative of heart failure with EF 55%. Patient noted to have extensive atherosclerosis on his CT scan. This is the most likely reason for his  infarct. Hematology was consulted. TEE was recommended by Dr. Cyndie Chime. TEE does not show any source of embolization. No PFO or ASD. In view of this, recommendation is to add 81 mg of aspirin to the Plavix that the patient is already taking. Von Willebrand panel was ordered. Levels are all high. There is no clear evidence that the patient has von Willebrand disease. Patient's abdominal pain has subsided. He is tolerating diet.  Essential Hypertension Continue Diltiazem, HCTZ and Toprol.   Hyperlipidemia  Continue Lopid  History of CVA in 2016  Continue Plavix. Aspirin added as stated above  Questionable Von Willebrand's disease Levels do not suggest this disease process. Apparently this was diagnosed in a family member. Outpatient follow-up with hematology.   Overall, stable. Ambulated in the hallway without difficulty. Above plan discussed with the patient and his wife. Patient keen on going home. Okay for discharge today.   PERTINENT LABS:  The results of significant diagnostics from this hospitalization (including imaging, microbiology, ancillary and laboratory) are listed below for reference.    Microbiology: Recent Results (from the past 240 hour(s))  Blood culture (routine x 2)     Status: None (Preliminary result)   Collection Time: 10/27/15  8:10 AM  Result Value Ref Range Status   Specimen Description BLOOD LEFT ANTECUBITAL  Final   Special Requests BOTTLES DRAWN AEROBIC ONLY 5CC  Final   Culture NO GROWTH 2 DAYS  Final   Report Status PENDING  Incomplete  Blood culture (routine x 2)     Status: None (Preliminary result)   Collection Time: 10/27/15  8:14 AM  Result Value Ref Range Status   Specimen Description BLOOD RIGHT  HAND  Final   Special Requests BOTTLES DRAWN AEROBIC AND ANAEROBIC 5CC  Final   Culture NO GROWTH 2 DAYS  Final   Report Status PENDING  Incomplete     Labs: Basic Metabolic Panel:  Recent Labs Lab 10/27/15 0028 10/28/15 0430 10/29/15 0541    NA 140 137 138  K 3.5 3.4* 3.5  CL 104 99* 102  CO2 GLUCOSE 108* 115* 90  BUN 18 19 21*  CREATININE 1.26* 1.37* 1.21  CALCIUM 9.1 8.5* 8.3*   Liver Function Tests:  Recent Labs Lab 10/27/15 0028  AST 20  ALT 10*  ALKPHOS 78  BILITOT 0.4  PROT 7.1  ALBUMIN 3.5    Recent Labs Lab 10/27/15 0028  LIPASE 27   CBC:  Recent Labs Lab 10/27/15 0028 10/28/15 0430 10/29/15 0541  WBC 10.3 11.0* 8.0  HGB 11.5* 10.3* 10.2*  HCT 35.4* 34.0* 32.8*  MCV 78.8 81.0 81.4  PLT 267 277 280    IMAGING STUDIES Ct Abdomen Pelvis W Contrast  10/27/2015  CLINICAL DATA:  Left-sided abdominal pain. History of diverticulitis. EXAM: CT ABDOMEN AND PELVIS WITH CONTRAST TECHNIQUE: Multidetector CT imaging of the abdomen and pelvis was performed using the standard protocol following bolus administration of intravenous contrast. CONTRAST:  OMNIPAQUE IOHEXOL 300 MG/ML  SOLN COMPARISON:  08/14/2015 FINDINGS: Atelectasis in the lung bases. Moderate-sized esophageal hiatal hernia. Mild diffuse fatty infiltration of the liver. Focal wedge-shaped hypo enhancing region in the upper spleen suggesting splenic infarct. Calcified granulomas in the spleen. The gallbladder, pancreas, adrenal glands, kidneys, abdominal aorta, inferior vena cava, and retroperitoneal lymph nodes are unremarkable. Stomach, small bowel, and colon are not abnormally distended. Scattered diverticula in the colon. No free air or free fluid in the abdomen. Periumbilical hernia containing fat. Pelvis: Scattered diverticula in the sigmoid colon. No evidence of diverticulitis. Small left inguinal hernia containing fat. No pelvic mass or lymphadenopathy. Prostate gland is enlarged at 4.9 cm diameter. Bladder wall is not thickened. No free or loculated pelvic fluid collections. Surgical absence of the appendix. Degenerative changes in the spine. No destructive bone lesions. IMPRESSION: Splenic infarct. Small left inguinal hernia  containing fat. Scattered diverticula in the colon without evidence of diverticulitis. Moderate-sized esophageal hiatal hernia. Electronically Signed   By: Burman Nieves M.D.   On: 10/27/2015 05:40    DISCHARGE EXAMINATION: Filed Vitals:   10/28/15 2125 10/29/15 0546 10/29/15 1026 10/29/15 1351  BP: 122/45 136/54 141/48 141/50  Pulse: 74 63  73  Temp: 98.2 F (36.8 C) 98.7 F (37.1 C)  98.6 F (37 C)  TempSrc: Oral Oral  Oral  Resp: Height:      Weight:      SpO2: 92% 93%  97%   General appearance: alert, cooperative, appears stated age and no distress Resp: clear to auscultation bilaterally Cardio: regular rate and rhythm, S1, S2 normal, no murmur, click, rub or gallop GI: soft, non-tender; bowel sounds normal; no masses,  no organomegaly Extremities: extremities normal, atraumatic, no cyanosis or edema  DISPOSITION: Home with wife  Discharge Instructions    Call MD for:  difficulty breathing, headache or visual disturbances    Complete by:  As directed      Call MD for:  extreme fatigue    Complete by:  As directed      Call MD for:  persistant dizziness or light-headedness    Complete by:  As directed  Call MD for:  persistant nausea and vomiting    Complete by:  As directed      Call MD for:  severe uncontrolled pain    Complete by:  As directed      Call MD for:  temperature >100.4    Complete by:  As directed      Diet - low sodium heart healthy    Complete by:  As directed      Discharge instructions    Complete by:  As directed   Please follow-up with your primary care physician next week. You will also need to follow-up with Dr. Cyndie ChimeGranfortuna in 3-4 weeks.  You were cared for by a hospitalist during your hospital stay. If you have any questions about your discharge medications or the care you received while you were in the hospital after you are discharged, you can call the unit and asked to speak with the hospitalist on call if the hospitalist  that took care of you is not available. Once you are discharged, your primary care physician will handle any further medical issues. Please note that NO REFILLS for any discharge medications will be authorized once you are discharged, as it is imperative that you return to your primary care physician (or establish a relationship with a primary care physician if you do not have one) for your aftercare needs so that they can reassess your need for medications and monitor your lab values. If you do not have a primary care physician, you can call [phone] for a physician referral.     Increase activity slowly    Complete by:  As directed            ALLERGIES:  Allergies  Allergen Reactions  . Atorvastatin     REACTION: muscles tense up   . Codeine Nausea And Vomiting  . Lisinopril Swelling    Lips and face swelling  . Statins Other (See Comments)    Muscle tense up     Current Discharge Medication List    START taking these medications   Details  aspirin EC 81 MG tablet Take 1 tablet (81 mg total) by mouth daily.    oxyCODONE (OXY IR/ROXICODONE) 5 MG immediate release tablet Take 1 tablet (5 mg total) by mouth every 6 (six) hours as needed for severe pain. Qty: 20 tablet, Refills: 0      CONTINUE these medications which have NOT CHANGED   Details  acetaminophen (TYLENOL) 500 MG tablet Take 1,000 mg by mouth at bedtime.     Alirocumab (PRALUENT) 150 MG/ML SOSY Inject 150 mg into the skin every 14 (fourteen) days. Qty: 2 mL, Refills: 5    Bevacizumab (AVASTIN IV) Place 1 Applicatorful into both eyes See admin instructions. Right eye every 9 weeks Left eye every 6 weeks.    cetirizine (ZYRTEC) 10 MG tablet Take 10 mg by mouth daily as needed for allergies.     clopidogrel (PLAVIX) 75 MG tablet Take 1 tablet (75 mg total) by mouth daily. Qty: 30 tablet, Refills: 5    diltiazem (DILACOR XR) 180 MG 24 hr capsule Take 1 capsule (180 mg total) by mouth daily. Qty: 30 capsule,  Refills: 5    fluticasone (FLONASE) 50 MCG/ACT nasal spray Place 2 sprays into both nostrils daily as needed for allergies. Qty: 16 g, Refills: 5    gabapentin (NEURONTIN) 300 MG capsule TAKE ONE CAPSULE BY MOUTH TWICE A DAY Qty: 180 capsule, Refills: 3    gemfibrozil (LOPID)  600 MG tablet Take 1 tablet (600 mg total) by mouth 2 (two) times daily. Qty: 60 tablet, Refills: 12    hydrochlorothiazide (HYDRODIURIL) 25 MG tablet Take 1 tablet (25 mg total) by mouth daily. Qty: 30 tablet, Refills: 12    Hypromellose (ARTIFICIAL TEARS OP) Apply 1 drop to eye daily as needed (itchy / watery eyes).    loperamide (IMODIUM) 2 MG capsule Take 1 capsule (2 mg total) by mouth 3 (three) times daily as needed for diarrhea or loose stools. Qty: 10 capsule, Refills: 0    metoprolol succinate (TOPROL-XL) 25 MG 24 hr tablet Take 1 tablet (25 mg total) by mouth at bedtime. Qty: 30 tablet, Refills: 11    Multiple Vitamins-Minerals (OCUVITE ADULT 50+ PO) Take 1 capsule by mouth daily.      pantoprazole (PROTONIX) 40 MG tablet Take 1 tablet (40 mg total) by mouth 2 (two) times daily before a meal. Qty: 60 tablet, Refills: 0    ranitidine (ZANTAC) 150 MG tablet Take 1 tablet (150 mg total) by mouth 2 (two) times daily. Qty: 30 tablet, Refills: 11    sucralfate (CARAFATE) 1 G tablet TAKE 1 TABLET BY MOUTH BEFORE LAYING DOWN FOR NAP OR BEDTIME Qty: 30 tablet, Refills: 5       Follow-up Information    Follow up with [name], MD. Schedule an appointment as soon as possible for a visit in 1 week.   Specialty:  Family Medicine   Why:  post hospitalization follow up   Contact information:   [address] [place] [address] [phone]       Follow up with Levert Feinstein, MD. Schedule an appointment as soon as possible for a visit in 3 weeks.   Specialty:  Oncology   Why:  post hospitalization follow up   Contact information:   [address] [place]  [address] [phone]       TOTAL DISCHARGE TIME: 35 minutes  [facility]  [facility] Pager [phone]  10/29/2015, 3:20 PM

## 2015-10-30 ENCOUNTER — Telehealth: Payer: Self-pay | Admitting: *Deleted

## 2015-10-30 NOTE — Telephone Encounter (Signed)
Transition Care Management Follow-up Telephone Call   Date discharged? 10/29/15   How have you been since you were released from the hospital? Improving but still not up to par.  No pain since discharge.   Do you understand why you were in the hospital? yes   Do you understand the discharge instructions? yes   Where were you discharged to? home   Items Reviewed:  Medications reviewed: yes  Allergies reviewed: yes  Dietary changes reviewed: no  Referrals reviewed: heme/onc, cardiology   Functional Questionnaire:   Activities of Daily Living (ADLs):   He states they are independent in the following: ambulation, bathing and hygiene, feeding, continence, grooming, toileting and dressing States they require assistance with the following: none   Any transportation issues/concerns?: no   Any patient concerns? no   Confirmed importance and date/time of follow-up visits scheduled yes, 11/03/15 @ 1130  Provider Appointment booked with G. Raechel AcheShaw Duncan, MD  Confirmed with patient if condition begins to worsen call PCP or go to the ER.  Patient was given the office number and encouraged to call back with question or concerns.  : yes

## 2015-11-01 LAB — CULTURE, BLOOD (ROUTINE X 2)
Culture: NO GROWTH
Culture: NO GROWTH

## 2015-11-02 LAB — PROTHROMBIN GENE MUTATION

## 2015-11-02 LAB — FACTOR 5 LEIDEN

## 2015-11-03 ENCOUNTER — Encounter: Payer: Self-pay | Admitting: Family Medicine

## 2015-11-03 ENCOUNTER — Ambulatory Visit (INDEPENDENT_AMBULATORY_CARE_PROVIDER_SITE_OTHER): Payer: Medicare HMO | Admitting: Family Medicine

## 2015-11-03 VITALS — BP 144/64 | HR 80 | Temp 97.7°F | Wt 158.0 lb

## 2015-11-03 DIAGNOSIS — Z5189 Encounter for other specified aftercare: Secondary | ICD-10-CM

## 2015-11-03 DIAGNOSIS — D735 Infarction of spleen: Secondary | ICD-10-CM | POA: Diagnosis not present

## 2015-11-03 NOTE — Progress Notes (Signed)
Pre visit review using our clinic review tool, if applicable. No additional management support is needed unless otherwise documented below in the visit note.  Admit date: 10/27/2015 Discharge date: 10/29/2015  PCP: [name], MD  DISCHARGE DIAGNOSES:  Principal Problem:  Splenic infarct Active Problems:  Essential hypertension  Diverticulitis, colon  AP (abdominal pain)  Ectatic abdominal aorta (HCC)  HLD (hyperlipidemia)  Splenic infarction   RECOMMENDATIONS FOR OUTPATIENT FOLLOW UP: 1. Follow-up with PCP next week 2. Low-dose aspirin added to Plavix   DISCHARGE CONDITION: fair  Diet recommendation: Heart healthy  Filed Weights   10/27/15 0838 10/28/15 1211  Weight: 71.442 kg (157 lb 8 oz) 71.215 kg (157 lb)    INITIAL HISTORY: 82 year old Caucasian male with a past medical history of Barrett's esophagus, GERD, hyperlipidemia, von Willebrand's disease, peptic ulcer disease who presented with abdominal pain and was found to have a splenic infarct. Patient was hospitalized for further management.  Consultants: Hematology, cardiology  Procedures:  TEE "Findings: Please see echo section for full report. Normal LV size and thickness. EF 55%. Normal RV size and systolic function. Trileaflet aortic valve with no stenosis or regurgitation. Trivial MR, trivial TR. Normal left atrial size, no LAA thrombus. Normal right atrial size with Chiari network noted. Negative bubble study, no evidence for PFO or ASD. There was grade IV plaque in the descending thoracic aorta. "  HOSPITAL COURSE:   Splenic infarct Patient has a prior history of CVA in 03/2015 on ASA/Plavix, Von Willebrand's disease, HTN, HLD, and ectatic abdominal aorta. CT abdomen and pelvis was remarkable for splenic infarct. Hypercoagulable workup is in process. Last 2 D echo in 2016 was not indicative of heart failure with EF 55%. Patient noted to have extensive atherosclerosis on his CT scan.  This is the most likely reason for his infarct. Hematology was consulted. TEE was recommended by Dr. Cyndie Chime. TEE does not show any source of embolization. No PFO or ASD. In view of this, recommendation is to add 81 mg of aspirin to the Plavix that the patient is already taking. Von Willebrand panel was ordered. Levels are all high. There is no clear evidence that the patient has von Willebrand disease. Patient's abdominal pain has subsided. He is tolerating diet.  Essential Hypertension Continue Diltiazem, HCTZ and Toprol.   Hyperlipidemia  Continue Lopid  History of CVA in 2016  Continue Plavix. Aspirin added as stated above  Questionable Von Willebrand's disease Levels do not suggest this disease process. Apparently this was diagnosed in a family member. Outpatient follow-up with hematology.   Overall, stable. Ambulated in the hallway without difficulty. Above plan discussed with the patient and his wife. Patient keen on going home. Okay for discharge today.     The above d/w pt.  Admitted with splenic infarct.  Has taken two doses of oxycodone since discharge, but not in the last day or two.  No pain now.  Not much appetite or energy now, but his appetite is getting better.  Some of this may be deconditioning from the inpatient stay in general.  No ADE on current meds.  No FCNAVD.  No abd pain.    TEE w/o thrombus seen.  CT results d/w pt.   5 leiden results d/w pt, along with the fact that he likely doesn't have von Willenbrand disease.  Detailed conversation re: clotting risk.   PMH and SH reviewed  ROS: See HPI, otherwise noncontributory.  Meds, vitals, and allergies reviewed.   GEN: nad, alert and oriented  HEENT: mucous membranes moist NECK: supple w/o LA CV: rrr PULM: ctab, no inc wob ABD: soft, +bs EXT: no edema

## 2015-11-03 NOTE — Patient Instructions (Signed)
Keep taking your regular medicine with the asprin and plavix.  Keep the appointment with Dr. Cyndie ChimeGranfortuna.  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  It looks like you don't have von Willenbrand disease.   Take care. Glad to see you.  I'll await the notes from Dr. Cyndie ChimeGranfortuna.

## 2015-11-03 NOTE — Assessment & Plan Note (Signed)
Pain resolved.  I think he'll gradually improve in the next few days, with more strength and appetite.  Off pain meds.  Benign abd.  5 leiden results d/w pt, along with the fact that he likely doesn't have von Willenbrand disease.  Detailed conversation re: clotting risk.  Would continue ASA and plavix for now, not bleeding.   Okay for outpatient f/u.  Pt/wife to check on f/u appt with Dr. Rogelio SeenGrnafortuna and I greatly appreciate his help, along with the help of all others in caring for this patient.

## 2015-11-05 ENCOUNTER — Ambulatory Visit: Payer: Medicare HMO | Admitting: Family Medicine

## 2015-11-16 ENCOUNTER — Encounter: Payer: Self-pay | Admitting: Oncology

## 2015-11-16 ENCOUNTER — Ambulatory Visit (INDEPENDENT_AMBULATORY_CARE_PROVIDER_SITE_OTHER): Payer: Medicare HMO | Admitting: Oncology

## 2015-11-16 VITALS — BP 134/63 | HR 62 | Temp 97.4°F | Ht 69.0 in | Wt 161.8 lb

## 2015-11-16 DIAGNOSIS — Z862 Personal history of diseases of the blood and blood-forming organs and certain disorders involving the immune mechanism: Secondary | ICD-10-CM

## 2015-11-16 DIAGNOSIS — D735 Infarction of spleen: Secondary | ICD-10-CM

## 2015-11-16 DIAGNOSIS — D680 Von Willebrand's disease: Secondary | ICD-10-CM

## 2015-11-16 DIAGNOSIS — D6801 Von willebrand disease, type 1: Secondary | ICD-10-CM

## 2015-11-16 NOTE — Patient Instructions (Addendum)
OK to stop Aspirin at the end of May and just stay on Plavix Return visit 6 months Lab 2 weeks before visit

## 2015-11-17 ENCOUNTER — Encounter: Payer: Self-pay | Admitting: Oncology

## 2015-11-17 NOTE — Progress Notes (Signed)
Patient ID: [name], male   DOB: 1925/12/20, 82 y.o.   MRN: [id number] Hematology and Oncology Follow Up Visit  [name] [id number] July 31, 1926 82 y.o. 11/17/2015 3:31 PM   Principle Diagnosis: Encounter Diagnoses  Name Primary?  . Splenic infarction Yes  . Von Willebrand disease type IA (HCC)    Interim History:  First post hospital visit for this 82 year old man I saw in consultation on 10/27/2015 when he presented with the acute onset of abdominal pain with findings consistent with acute splenic infarction. No evidence for thrombosis of the splenic vein or artery. No other obvious intra-abdominal pathology. No evidence for diverticulitis. Of note was a 08/14/2015 CT scan of the abdomen which showed a normal spleen, a 29 mm in diameter abdominal aortic aneurysm, and extensive soft and calcified atherosclerotic plaque throughout the aorta. The patient gives a history of von Willebrand's disease diagnosed about 30 years ago. One of his sons was the index case, currently 23 years old. His son's daughter, now age 2, also tested positive but has never had any clinical bleeding. Of note the patient had a prior stroke in June 2016. Brain studies at that time showed diffuse atheromatous disease and multifocal cerebrovascular stenosis. He was initially put on aspirin and Plavix. At time of recent admission he was on Plavix alone. Despite the fact that he was on dual antiplatelet agents and presumably had von Willebrand's disorder, he never had any excessive bleeding. He had a remote appendectomy, hernia repair, and dental extractions without any excessive bleeding. He denied any epistaxis, gum bleeding, hematochezia, melena, or hematuria. He did admit to bruising easy on the Plavix.  It appeared likely that his splenic infarct was atheroembolic in etiology. A transesophageal echocardiogram did not show any cardiac source of emboli. No PFO. No ASD.  I obtained a von Willebrand profile while he was  in the hospital and all of the values were either high normal or higher than the highest control value. Some of this may reflect acute phase reaction to the splenic infarction. This does not exclude mild von Willebrand's disorder but certainly excludes severe VWD.  I recommended conservative treatment with observation and analgesics. There was no sign of any extension of the splenic infarct or hemorrhage. I recommended continuing Plavix and adding back the aspirin at least temporarily. His symptoms resolved almost completely over the next 72 hours. At present he has no further abdominal pain. He told me he only used analgesics twice after he got home.  Medications: reviewed  Allergies:  Allergies  Allergen Reactions  . Atorvastatin     REACTION: muscles tense up   . Codeine Nausea And Vomiting  . Lisinopril Swelling    Lips and face swelling  . Statins Other (See Comments)    Muscle tense up    Review of Systems: No new symptoms reported today.  Remaining ROS negative:   Physical Exam: Blood pressure 134/63, pulse 62, temperature 97.4 F (36.3 C), temperature source Oral, height  (1.753 m), weight 161 lb 12.8 oz (73.392 kg), SpO2 100 %. Wt Readings from Last 3 Encounters:  11/16/15 161 lb 12.8 oz (73.392 kg)  11/03/15 158 lb (71.668 kg)  10/28/15 157 lb (71.215 kg)     General appearance: Well-nourished Caucasian man HENNT: Pharynx no erythema, exudate, mass, or ulcer. No thyromegaly or thyroid nodules Lymph nodes: No cervical, supraclavicular, or axillary lymphadenopathy Breasts:  Lungs: Clear to auscultation, resonant to percussion throughout Heart: Regular rhythm, no murmur, no gallop,  no rub, no click, no edema Abdomen: Soft, nontender, normal bowel sounds, no mass, no organomegaly. No abdominal bruits. No pulsatile mass. Extremities: No edema, no calf tenderness Musculoskeletal: no joint deformities GU:  Vascular: Carotid pulses 2+, no bruits,  Neurologic: Alert,  oriented, PERRLA,   cranial nerves grossly normal, motor strength 5 over 5, reflexes 1+ symmetric, upper body coordination normal, gait normal, Skin: No rash or ecchymosis  Lab Results: CBC W/Diff    Component Value Date/Time   WBC 8.0 10/29/2015 0541   WBC 7.6 05/23/2006 1300   RBC 4.03* 10/29/2015 0541   RBC 5.78* 05/23/2006 1300   HGB 10.2* 10/29/2015 0541   HGB 16.9 05/23/2006 1300   HCT 32.8* 10/29/2015 0541   HCT 49.9 05/23/2006 1300   PLT 280 10/29/2015 0541   PLT 256 05/23/2006 1300   MCV 81.4 10/29/2015 0541   MCV 86.3 05/23/2006 1300   MCH 25.3* 10/29/2015 0541   MCH 29.3 05/23/2006 1300   MCHC 31.1 10/29/2015 0541   MCHC 33.9 05/23/2006 1300   RDW 14.7 10/29/2015 0541   RDW 13.7 05/23/2006 1300   LYMPHSABS 2.0 08/14/2015 1030   LYMPHSABS 1.1 05/23/2006 1300   MONOABS 0.6 08/14/2015 1030   MONOABS 0.6 05/23/2006 1300   EOSABS 0.2 08/14/2015 1030   EOSABS 0.1 05/23/2006 1300   BASOSABS 0.0 08/14/2015 1030   BASOSABS 0.0 05/23/2006 1300     Chemistry      Component Value Date/Time   NA 138 10/29/2015 0541   K 3.5 10/29/2015 0541   CL 102 10/29/2015 0541   CO2 26 10/29/2015 0541   BUN 21* 10/29/2015 0541   CREATININE 1.21 10/29/2015 0541      Component Value Date/Time   CALCIUM 8.3* 10/29/2015 0541   ALKPHOS 78 10/27/2015 0028   AST 20 10/27/2015 0028   ALT 10* 10/27/2015 0028   BILITOT 0.4 10/27/2015 0028       Radiological Studies: Ct Abdomen Pelvis W Contrast  10/27/2015  CLINICAL DATA:  Left-sided abdominal pain. History of diverticulitis. EXAM: CT ABDOMEN AND PELVIS WITH CONTRAST TECHNIQUE: Multidetector CT imaging of the abdomen and pelvis was performed using the standard protocol following bolus administration of intravenous contrast. CONTRAST:  OMNIPAQUE IOHEXOL 300 MG/ML  SOLN COMPARISON:  08/14/2015 FINDINGS: Atelectasis in the lung bases. Moderate-sized esophageal hiatal hernia. Mild diffuse fatty infiltration of the liver. Focal  wedge-shaped hypo enhancing region in the upper spleen suggesting splenic infarct. Calcified granulomas in the spleen. The gallbladder, pancreas, adrenal glands, kidneys, abdominal aorta, inferior vena cava, and retroperitoneal lymph nodes are unremarkable. Stomach, small bowel, and colon are not abnormally distended. Scattered diverticula in the colon. No free air or free fluid in the abdomen. Periumbilical hernia containing fat. Pelvis: Scattered diverticula in the sigmoid colon. No evidence of diverticulitis. Small left inguinal hernia containing fat. No pelvic mass or lymphadenopathy. Prostate gland is enlarged at 4.9 cm diameter. Bladder wall is not thickened. No free or loculated pelvic fluid collections. Surgical absence of the appendix. Degenerative changes in the spine. No destructive bone lesions. IMPRESSION: Splenic infarct. Small left inguinal hernia containing fat. Scattered diverticula in the colon without evidence of diverticulitis. Moderate-sized esophageal hiatal hernia. Electronically Signed   By: Burman Nieves M.D.   On: 10/27/2015 05:40    Impression:  #1. Acute splenic infarction. Most likely related to atheroemboli from his aorta. See discussion above. Symptoms were self-limited. Recommendation to resume short-term dual antiplatelet agents. I told him he could stop the aspirin again  at the end of May after a two-month trial and just continue the Plavix. This will maximize positive results and minimize bleeding.  #2. Question of mild type I von Willebrand's disorder I'm going to have him come back and repeat a VW profile in 6 months. No evidence based on studies we did in the hospital and another random study done in October, 2007 to suggest that he actually has von Willebrand's disease. If he does, it is quite mild.   CC: Patient Care Team: Joaquim NamGraham S Duncan, MD as PCP - General (Family Medicine)   Levert FeinsteinJAMES M Deronda Christian, MD 3/28/20173:31 PM

## 2015-12-02 ENCOUNTER — Telehealth: Payer: Self-pay | Admitting: Family Medicine

## 2015-12-02 NOTE — Telephone Encounter (Signed)
I have confidence in my colleagues to see patients.  I would offer a slot with me if available, then with another colleague, then UC if needed.  Thanks.

## 2015-12-02 NOTE — Telephone Encounter (Signed)
Patient's wife notified as instructed by telephone and verbalized understanding. Mrs. Yetta BarreJones stated that he has been having these spells off and on since he got out of the hospital and he is not any worse today than other days. Patient's wife stated that he refuses to see anyone else and wants to know if Dr. Para Marchuncan can work him in tomorrow. Please advise.

## 2015-12-02 NOTE — Telephone Encounter (Signed)
atient Name: Martin PurpuraJOHN Golding  DOB: 02/27/1926    Initial Comment Caller states, husband wants an appt, dizzy and has weakness which makes it difficult to walk    Nurse Assessment  Nurse: Stefano GaulStringer, RN, Dwana CurdVera Date/Time (Eastern Time): 12/02/2015 10:17:19 AM  Confirm and document reason for call. If symptomatic, describe symptoms. You must click the next button to save text entered. ---Caller states spouse is dizzy and weak. Has had dizziness for a few days. He is still able to walk.  Has the patient traveled out of the country within the last 30 days? ---No  Does the patient have any new or worsening symptoms? ---Yes  Will a triage be completed? ---Yes  Related visit to physician within the last 2 weeks? ---Yes  Does the PT have any chronic conditions? (i.e. diabetes, asthma, etc.) ---Yes  List chronic conditions. ---irregular heart beat;  Is this a behavioral health or substance abuse call? ---No     Guidelines    Guideline Title Affirmed Question Affirmed Notes  Dizziness - Lightheadedness Taking a medicine that could cause dizziness (e.g., blood pressure medications, diuretics)    Final Disposition User   See Physician within 24 Hours SumnerStringer, RN, Dwana CurdVera    Comments  No appts available with Dr. Para Marchuncan within 24 hrs and pt does not want to see another doctor or go to urgent care. Please call pt back regarding appt.   Referrals  GO TO FACILITY REFUSED   Disagree/Comply: Comply

## 2015-12-02 NOTE — Telephone Encounter (Addendum)
Can you open a slot at any point and ask him to be here for a 12:45 tomorrow? Thanks.

## 2015-12-02 NOTE — Telephone Encounter (Signed)
Patient's wife notified of appointment tomorrow at 12:45 which she agreed.. Time slot opened by Robin in the front office. Patient's wife agreed to take him to the ER if symptoms get worse before appointment tomorrow.

## 2015-12-02 NOTE — Telephone Encounter (Signed)
Spoke with Mrs [name] and pt only wants to see Dr Para Marchuncan; explained Dr Para Marchuncan is not in the office until 2 PM and does not have any available appts; Mrs [name] said pt will not see another doctor; Mrs [name] did say if pt condition changes or worsens prior to cb she will see to it that pt goes to ED.

## 2015-12-03 ENCOUNTER — Ambulatory Visit (INDEPENDENT_AMBULATORY_CARE_PROVIDER_SITE_OTHER): Payer: Medicare HMO | Admitting: Family Medicine

## 2015-12-03 ENCOUNTER — Ambulatory Visit
Admission: RE | Admit: 2015-12-03 | Discharge: 2015-12-03 | Disposition: A | Discharge status: Home / Self Care | Payer: Medicare HMO | Source: Ambulatory Visit | Attending: Family Medicine | Admitting: Family Medicine

## 2015-12-03 ENCOUNTER — Ambulatory Visit (INDEPENDENT_AMBULATORY_CARE_PROVIDER_SITE_OTHER)
Admission: RE | Admit: 2015-12-03 | Discharge: 2015-12-03 | Disposition: A | Discharge status: Home / Self Care | Payer: Medicare HMO | Source: Ambulatory Visit | Attending: Family Medicine | Admitting: Family Medicine

## 2015-12-03 ENCOUNTER — Encounter: Payer: Self-pay | Admitting: Family Medicine

## 2015-12-03 VITALS — BP 130/60 | HR 70 | Temp 97.7°F | Wt 157.5 lb

## 2015-12-03 DIAGNOSIS — M25562 Pain in left knee: Secondary | ICD-10-CM

## 2015-12-03 DIAGNOSIS — R42 Dizziness and giddiness: Secondary | ICD-10-CM

## 2015-12-03 DIAGNOSIS — M25561 Pain in right knee: Secondary | ICD-10-CM | POA: Diagnosis not present

## 2015-12-03 DIAGNOSIS — R5383 Other fatigue: Secondary | ICD-10-CM | POA: Diagnosis not present

## 2015-12-03 LAB — CBC WITH DIFFERENTIAL/PLATELET
Basophils Absolute: 0.1 10*3/uL (ref 0.0–0.1)
Basophils Relative: 0.7 % (ref 0.0–3.0)
Eosinophils Absolute: 0.2 10*3/uL (ref 0.0–0.7)
Eosinophils Relative: 2.7 % (ref 0.0–5.0)
HCT: 33.6 % — ABNORMAL LOW (ref 39.0–52.0)
Hemoglobin: 10.6 g/dL — ABNORMAL LOW (ref 13.0–17.0)
Lymphocytes Relative: 22.1 % (ref 12.0–46.0)
Lymphs Abs: 1.5 10*3/uL (ref 0.7–4.0)
MCHC: 31.7 g/dL (ref 30.0–36.0)
MCV: 76 fl — ABNORMAL LOW (ref 78.0–100.0)
Monocytes Absolute: 0.8 10*3/uL (ref 0.1–1.0)
Monocytes Relative: 11.7 % (ref 3.0–12.0)
Neutro Abs: 4.3 10*3/uL (ref 1.4–7.7)
Neutrophils Relative %: 62.8 % (ref 43.0–77.0)
Platelets: 335 10*3/uL (ref 150.0–400.0)
RBC: 4.42 Mil/uL (ref 4.22–5.81)
RDW: 15.9 % — ABNORMAL HIGH (ref 11.5–15.5)
WBC: 6.9 10*3/uL (ref 4.0–10.5)

## 2015-12-03 MED ORDER — TRAMADOL HCL 50 MG PO TABS
50.0000 mg | ORAL_TABLET | Freq: Three times a day (TID) | ORAL | Status: DC | PRN
Start: 2015-12-03 — End: 2016-02-09

## 2015-12-03 MED ORDER — MECLIZINE HCL 25 MG PO TABS: 12.5000 mg | ORAL_TABLET | Freq: Three times a day (TID) | ORAL | Status: DC | PRN

## 2015-12-03 NOTE — Patient Instructions (Signed)
You can try meclizine if needed for the troubles you have rolling over in the bed.  It can make you drowsy.  Go to the lab on the way out.  We'll contact you with your xray report. Stop the oxycodone for the knee pain and try tramadol instead.  Take care.  Glad to see you.

## 2015-12-03 NOTE — Progress Notes (Signed)
Pre visit review using our clinic review tool, if applicable. No additional management support is needed unless otherwise documented below in the visit note.  Sx going since 03/2015, at least.  Not worse in the meantime.  Not constant.  He'll have sx when rolling onto R side when in the bed.  Brief sx.  Doesn't have sx with standing.   Fatigued.  That is constant and ongoing. Not worse, not better, but at baseline.  No abd pain.  He didn't know if he had fully gotten over prev splenic issues, ie still deconditioned.    B knee pain.  Had used left over pain meds prn, rarely.  More pain intermittently in the last year.  Tylenol doesn't help.  Pain worse at night.  No trauma.    Meds, vitals, and allergies reviewed.   ROS: See HPI.  Otherwise, noncontributory.  GEN: nad, alert and oriented HEENT: mucous membranes moist, TM wnl B, OP wnl, no sx with head turning at the OV.  NECK: supple w/o LA CV: rrr. PULM: ctab, no inc wob ABD: soft, +bs EXT: no edema SKIN: no acute rash No crepitus on ROM B knees.  Joint lines not ttp.

## 2015-12-06 DIAGNOSIS — R42 Dizziness and giddiness: Secondary | ICD-10-CM | POA: Insufficient documentation

## 2015-12-06 DIAGNOSIS — R5383 Other fatigue: Secondary | ICD-10-CM | POA: Insufficient documentation

## 2015-12-06 DIAGNOSIS — M25569 Pain in unspecified knee: Secondary | ICD-10-CM | POA: Insufficient documentation

## 2015-12-06 NOTE — Assessment & Plan Note (Signed)
Unclear if from prev CVA vs BPV. He can try meclizine if needed for the sx when rolling over in the bed.  Sedation caution. >25 minutes spent in face to face time with patient, >50% spent in counselling or coordination of care.

## 2015-12-06 NOTE — Assessment & Plan Note (Signed)
Neg imaging, no clear issue on exam.  We'll check with patient to see if he thinks the praluent is contributing. Okay for outpatient f/u.  Imaging revivewed, I agree with no acute process.

## 2015-12-06 NOTE — Assessment & Plan Note (Signed)
Unclear if from deconditioning.  Recheck CBC.  See notes on labs.

## 2015-12-07 ENCOUNTER — Ambulatory Visit: Payer: Medicare HMO | Admitting: Family Medicine

## 2015-12-07 ENCOUNTER — Ambulatory Visit (INDEPENDENT_AMBULATORY_CARE_PROVIDER_SITE_OTHER): Payer: Medicare HMO | Admitting: Neurology

## 2015-12-07 ENCOUNTER — Encounter: Payer: Self-pay | Admitting: Neurology

## 2015-12-07 VITALS — BP 125/66 | HR 76 | Ht 69.0 in | Wt 158.4 lb

## 2015-12-07 DIAGNOSIS — I10 Essential (primary) hypertension: Secondary | ICD-10-CM | POA: Diagnosis not present

## 2015-12-07 DIAGNOSIS — I6350 Cerebral infarction due to unspecified occlusion or stenosis of unspecified cerebral artery: Secondary | ICD-10-CM

## 2015-12-07 DIAGNOSIS — D735 Infarction of spleen: Secondary | ICD-10-CM

## 2015-12-07 DIAGNOSIS — E785 Hyperlipidemia, unspecified: Secondary | ICD-10-CM

## 2015-12-07 DIAGNOSIS — I639 Cerebral infarction, unspecified: Secondary | ICD-10-CM

## 2015-12-07 NOTE — Progress Notes (Signed)
STROKE NEUROLOGY FOLLOW UP NOTE  NAME: [name] DOB: 1926/04/14  REASON FOR VISIT: stroke follow up HISTORY FROM: pt and chart  Today we had the pleasure of seeing [name] in follow-up at our Neurology Clinic. Pt was accompanied by wife.   History Summary [name] is a 82 y.o. male with history of hypertension, hyperlipidemia, COPD, diverticulitis, and von Willebrand's disease was admitted on 01/28/15 for dizziness and gait instability. MRI showed acute left frontal MCA/ACA punctate infarcts, as well as chronic right CR and left cerebellum. MRA head and CTA neck showed diffuse intracranial athero and left VA occlusion. Stroke work up showed normal 2D echo, but LDL 176. He was started on dural antiplatelet and recommend outpt PCSK9 inhibitors as he is not tolerating statins.  He was discharged home with home health.  However, he was admitted again on 03/29/15 for dizziness, lightheadedness and gait instability, almost same as last time. MRI showed new acute infarct at right frontal cortex and subcortical WM. LDL down to 84. Stroke etiology still consistent with diffuse athero and ? Dehydration. He was continued on dural antiplatelet.   04/03/15 follow up - the patient has been doing well. No more stroke like symptoms since last discharge. He stated that he is compliant with medications and no missing does. No palpitation of heart. Had occasions that after bowel movement, had small amount of blood on tissue paper when wiping out buttock. Complaining of hard stool but denies dehydration. BP in clinic was high at 173/78.   06/08/15 follow up - pt has been doing well. No recurrent symptoms. He had 30 day cardiac event monitoring did not show afib. He is continued on ASA and plavix. He has been on pralucent for LDL control. His BP today 148/67.   Interval History During the interval time, pt has been doing well. Stated to have b/l knee pain and difficulty walking, requesting for  disability parking decal. Told him that his PCP can help him on this issue. On ASA and plavix, and also on praluent for HLD. On 10/27/15 he was admitted for abdominal pain, found to have splenic infarct. CT abd/pelvis with contrast showed a 29 mm in diameter abdominal aortic aneurysm, and extensive soft and calcified atherosclerotic plaque throughout the aorta. TEE done showed grade IV plaque in the descending thoracic aorta. His splenic infarct was considered to be related to atherosclerosis of aorta and its branches. Pt has seen Dr. Cyndie Chime for von Willebrand's disorder and considered mild type I if he has VMD. He was put back on DAPT until end of May. BP 125/66 today in clinic.   REVIEW OF SYSTEMS: Full 14 system review of systems performed and notable only for those listed below and in HPI above, all others are negative:  Constitutional:  Activity change, fatigue Cardiovascular:  Ear/Nose/Throat:  Hearing loss Skin: itching Eyes:  Eye itching Respiratory:  cough Gastroitestinal:  Genitourinary:  Hematology/Lymphatic:   Endocrine:  Musculoskeletal:  Back pain Allergy/Immunology:   Neurological:  Dizziness, weakness Psychiatric:  Sleep:   The following represents the patient's updated allergies and side effects list: Allergies  Allergen Reactions  . Atorvastatin     REACTION: muscles tense up   . Codeine Nausea And Vomiting  . Lisinopril Swelling    Lips and face swelling  . Statins Other (See Comments)    Muscle tense up    The neurologically relevant items on the patient's problem list were reviewed on today's visit.  Neurologic  Examination  A problem focused neurological exam (12 or more points of the single system neurologic examination, vital signs counts as 1 point, cranial nerves count for 8 points) was performed.  Blood pressure 125/66, pulse 76, height 5\' 9"  (1.753 m), weight 158 lb 6.4 oz (71.85 kg).  General - Well nourished, well developed, in no apparent  distress.  Ophthalmologic - Sharp disc margins OU. Fundi not visualized due to .  Cardiovascular - Regular rate and rhythm with no murmur.  musculoskeletal - bilateral knee pain.  Mental Status -  Level of arousal and orientation to time, place, and person were intact. Language including expression, naming, repetition, comprehension was assessed and found intact. Fund of Knowledge was assessed and was intact.  Cranial Nerves II - XII - II - Visual field intact OU. III, IV, VI - Extraocular movements intact. V - Facial sensation intact bilaterally. VII - Facial movement intact bilaterally. VIII - Hard of hearing & vestibular intact bilaterally. X - Palate elevates symmetrically. XI - Chin turning & shoulder shrug intact bilaterally. XII - Tongue protrusion intact.  Motor Strength - The patient's strength was normal in all extremities and pronator drift was absent.  Bulk was normal and fasciculations were absent.   Motor Tone - Muscle tone was assessed at the neck and appendages and was normal.  Reflexes - The patient's reflexes were 1+ in all extremities and he had no pathological reflexes.  Sensory - Light touch, temperature/pinprick were assessed and were normal.    Coordination - The patient had normal movements in the hands and feet with no ataxia or dysmetria.  Tremor was absent.  Gait and Station - limping L>R due to bilateral knee pain, mild stooped posturing.  Data reviewed: I personally reviewed the images and agree with the radiology interpretations.  Dg Chest 2 View 01/29/2015 No active cardiopulmonary disease.   Mr Brain Wo Contrast 01/28/2015 1. Punctate nonhemorrhagic white matter infarct deep to the right post central gyrus. A second punctate adjacent white matter infarct may be present as well. 2. Moderate generalized white matter disease bilaterally suggesting chronic microvascular ischemia. 3. Remote lacunar infarcts within the right coronal radiata and  left cerebellum.   Mr Maxine GlennMra Head/brain Wo Cm 01/29/2015 LEFT posterior-inferior cerebellar artery predominately terminates in PICA, with occluded LEFT V4 segment. Moderate to severe stenosis of RIGHT P2, moderate stenosis proximal LEFT P3. Moderate stenosis LEFT M2 segment origin.   MRA 03/29/15 - 1. No acute arterial finding or change from 01/29/2015. 2. Chronic left V4 segment occlusion with patent early branching left PICA. 3. Chronic bilateral PCA distribution moderate to advanced stenoses. 4. 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  MRI 03/29/15 New nonhemorrhagic tubular shaped area of restricted diffusion RIGHT frontal cortex and subcortical white matter, consistent with an acute perforating vessel infarct, RIGHT MCA territory. Normalization of the previous areas of acute infarctions demonstrated in June. Generalized severe atrophy and extensive small vessel disease. Moderately extensive LEFT middle ear and mastoid fluid is asymmetric and persistent. Correlate clinically for otomastoiditis.  Ct Angio Neck W/cm &/or Wo/cm 01/29/2015 Moderate narrowing origin of the left vertebral artery. Narrowing left vertebral artery at the C1-2 level. Occluded left vertebral artery at the level the foramen magnum (V4). Right vertebral artery is dominant with mild irregularity without high-grade stenosis. Plaque carotid bifurcation bilaterally with less than 50% diameter stenosis. Mild to slightly moderate narrowing proximal left common carotid artery. Cervical spondylotic changes most notable C4-5 thru C6-7 level. Complete opacification inferior left  mastoid air cells. No obstructing lesion of the eustachian tube is noted. Fluid filled upper cervical esophagus. Right hilar adenopathy.   2D echo  - Left ventricle: The cavity size was normal. Wall thickness wasincreased in a pattern of mild LVH. The estimated ejectionfraction was 55%. Wall motion was normal; there were no regionalwall  motion abnormalities. - Right ventricle: The cavity size was mildly dilated. Systolicfunction was mildly reduced. - Pulmonary arteries: PA peak pressure: 37 mm Hg (S). Impressions: No cardiac source of embolism was identified, butcannot be ruled out on the basis of this examination.  30 day cardiac monitoring - no afib  TEE 10/28/15  - Left ventricle: The cavity size was normal. Wall thickness was  normal. The estimated ejection fraction was 55%. Wall motion was  normal; there were no regional wall motion abnormalities. - Aortic valve: There was no stenosis. - Aorta: There was grade IV plaque in the descending thoracic  aorta. - Mitral valve: There was trivial regurgitation. - Left atrium: No evidence of thrombus in the atrial cavity or  appendage. - Right ventricle: The cavity size was normal. Systolic function  was normal. - Atrial septum: No defect or patent foramen ovale was identified.  Echo contrast study showed no right-to-left atrial level shunt,  at baseline or with provocation. Impressions: - Only possible source of embolus noted for splenic infarct would  be extensive descending thoracic aorta plaque.  CT abd/pel with contrast  08/14/15 1. Fluid throughout the colon compatible with diarrhea, but otherwise no acute or inflammatory process in the abdomen or pelvis. 2. Extensive soft and calcified atherosclerosis in the aorta, its branches, and the pelvic arteries. No major arterial occlusion identified. Stable mild infrarenal abdominal aortic aneurysm, 29 mm. Recommend followup by Ultrasound in 5 years.  10/27/15 Splenic infarct. Small left inguinal hernia containing fat. Scattered diverticula in the colon without evidence of diverticulitis. Moderate-sized esophageal hiatal hernia.  Component     Latest Ref Rng 03/30/2015 04/20/2015 06/12/2015 08/19/2015  Cholesterol     0 - 200 mg/dL 161 096 045 409  Triglycerides     0.0 - 149.0 mg/dL 90 811.9 147.8 295.6  (H)  HDL Cholesterol     >39.00 mg/dL 37 (L) 21.30 86.57 84.69  VLDL     0.0 - 40.0 mg/dL 18 62.9 52.8 41.3  LDL (calc)     0 - 99 mg/dL 84 74 83 91  Total CHOL/HDL Ratio      3.8 3 3 3   NonHDL       94.85 106.09 122.09  PTT Lupus Anticoagulant     0.0 - 43.6 sec      DRVVT     0.0 - 44.0 sec      Lupus Anticoag Interp           Beta-2 Glycoprotein I Ab, IgG     0 - 20 GPI IgG units      Beta-2-Glycoprotein I IgM     0 - 32 GPI IgM units      Beta-2-Glycoprotein I IgA     0 - 25 GPI IgA units      Anticardiolipin Ab,IgG,Qn     0 - 14 GPL U/mL      Anticardiolipin Ab,IgM,Qn     0 - 12 MPL U/mL      Anticardiolipin Ab,IgA,Qn     0 - 11 APL U/mL      Coagulation Factor VIII     57 - 163 %  Ristocetin Co-factor, Plasma     50 - 200 %      Von Willebrand Antigen, Plasma     50 - 200 %      Hemoglobin A1C     4.8 - 5.6 % 6.0 (H)     Mean Plasma Glucose      126     Recommendations-F5LEID:           Comment           Recommendations-PTGENE:           Additional Information           Interpretation           PDF Image           Antithrombin Activity     75 - 120 %      Protein C-Functional     73 - 180 %      Protein C, Total     60 - 150 %      Protein S-Functional     63 - 140 %      Protein S, Total     60 - 150 %      Homocysteine     0.0 - 15.0 umol/L       Component     Latest Ref Rng 10/27/2015  Cholesterol     0 - 200 mg/dL   Triglycerides     0.0 - 149.0 mg/dL   HDL Cholesterol     >81.19 mg/dL   VLDL     0.0 - 14.7 mg/dL   LDL (calc)     0 - 99 mg/dL   Total CHOL/HDL Ratio        NonHDL        PTT Lupus Anticoagulant     0.0 - 43.6 sec 39.4  DRVVT     0.0 - 44.0 sec 43.8  Lupus Anticoag Interp      Comment:  Beta-2 Glycoprotein I Ab, IgG     0 - 20 GPI IgG units <9  Beta-2-Glycoprotein I IgM     0 - 32 GPI IgM units <9  Beta-2-Glycoprotein I IgA     0 - 25 GPI IgA units <9  Anticardiolipin Ab,IgG,Qn     0 - 14 GPL U/mL  <9  Anticardiolipin Ab,IgM,Qn     0 - 12 MPL U/mL <9  Anticardiolipin Ab,IgA,Qn     0 - 11 APL U/mL <9  Coagulation Factor VIII     57 - 163 % 273 (H)  Ristocetin Co-factor, Plasma     50 - 200 % 145  Von Willebrand Antigen, Plasma     50 - 200 % 172  Hemoglobin A1C     4.8 - 5.6 %   Mean Plasma Glucose        Recommendations-F5LEID:      Comment (A)  Comment      Comment  Recommendations-PTGENE:      Comment  Additional Information      Comment  Interpretation      Note  PDF Image      .  Antithrombin Activity     75 - 120 % 137 (H)  Protein C-Functional     73 - 180 % 127  Protein C, Total     60 - 150 % 89  Protein S-Functional     63 - 140 % 84  Protein S, Total     60 - 150 %  121  Homocysteine     0.0 - 15.0 umol/L 17.1 (H)    Assessment: As you may recall, he is a 82 y.o. Caucasian male with PMH of hypertension, hyperlipidemia, COPD, diverticulitis, and von Willebrand's disease was admitted on 01/28/15 for dizziness and gait instability. MRI showed acute left frontal MCA/ACA punctate infarcts, as well as chronic right CR and left cerebellum. MRA head and CTA neck showed diffuse intracranial athero and left VA occlusion. Stroke work up showed normal 2D echo, but LDL 176. He was started on dural antiplatelet and recommend outpt PCSK9 inhibitors as he is not tolerating statins.  He was discharged home with home health. However, he was admitted again on 03/29/15 for similar symptoms. MRI showed new acute infarct at right frontal cortex and subcortical WM. LDL down to 84. He was continued on dural antiplatelet and started on praluent. 30 day cardiac event monitoring negative. After finished DAPT course, he was continued on plavix. 10/2015 he was admitted for splenic infarct, CT abd/pelvis with contrast showed a 29 mm AAA, and extensive soft and calcified atherosclerotic plaque throughout the aorta. TEE done showed grade IV plaque in the descending thoracic aorta. His splenic  infarct was considered to be related to atherosclerosis of aorta and its branches. Pt has seen Dr. Cyndie Chime for von Willebrand's disorder and considered possible mild type I. He was put back on DAPT until end of May. Due to the recurrent strokes and splenic infarcts, would like to consider loop recorder to rule out afib.   Plan:  - continue ASA and plavix for stroke prevention but take off ASA at the end of May as Dr. Cyndie Chime recommended.  - will refer to Dr. Johney Frame to consider loop recorder placement to rule out afib. - continue preluent for HLD treatment - Follow up with your primary care physician for stroke risk factor modification. Recommend maintain blood pressure goal 120-150/70-90, diabetes with hemoglobin A1c goal below 6.5% and lipids with LDL cholesterol goal below 70 mg/dL.  - check BP at home. - follow up with Dr. Para March for bilateral knee pain and the disability parking decal.  - follow up in 3 months  I spent more than 25 minutes of face to face time with the patient. Greater than 50% of time was spent in counseling and coordination of care. We discussed further stroke work up, cardiac monitoring and referral.    Orders Placed This Encounter  Procedures  . Ambulatory referral to Cardiac Electrophysiology    Referral Priority:  Routine    Referral Type:  Consultation    Referral Reason:  Specialty Services Required    Referred to Provider:  Hillis Range, MD    Requested Specialty:  Cardiology    Number of Visits Requested:  1    Meds ordered this encounter  Medications  . tamsulosin (FLOMAX) 0.4 MG CAPS capsule    Sig:     Patient Instructions  - continue ASA and plavix for stroke prevention but take off ASA at the end of May as Dr. Cyndie Chime recommended.  - will refer to Dr. Johney Frame to consider loop recorder placement to rule out afib. - continue preluent for HLD treatment - Follow up with your primary care physician for stroke risk factor modification.  Recommend maintain blood pressure goal 120-150/70-90, diabetes with hemoglobin A1c goal below 6.5% and lipids with LDL cholesterol goal below 70 mg/dL.  - check BP at home. - follow up with Dr. Para March for bilateral knee pain and the disability parking  decal.  - follow up in 3 months    Marvel Plan, MD PhD [facility] [address], [address] [place], [place] [address] [phone]

## 2015-12-07 NOTE — Patient Instructions (Addendum)
-   continue ASA and plavix for stroke prevention but take off ASA at the end of May as Dr. Cyndie ChimeGranfortuna recommended.  - will refer to Dr. Johney FrameAllred to consider loop recorder placement to rule out afib. - continue preluent for HLD treatment - Follow up with your primary care physician for stroke risk factor modification. Recommend maintain blood pressure goal 120-150/70-90, diabetes with hemoglobin A1c goal below 6.5% and lipids with LDL cholesterol goal below 70 mg/dL.  - check BP at home. - follow up with Dr. Para Marchuncan for bilateral knee pain and the disability parking decal.  - follow up in 3 months

## 2015-12-09 ENCOUNTER — Telehealth: Payer: Self-pay

## 2015-12-09 NOTE — Telephone Encounter (Signed)
Do we have any med assistance options on this?

## 2015-12-09 NOTE — Telephone Encounter (Signed)
[place] [facility] [place] left v[facility]m wanting to know if Dr Para Marchuncan knew of any resources to assist pt in paying for Praluent; each injection cost pt $400.00. [place] request cb.

## 2015-12-15 ENCOUNTER — Telehealth: Payer: Self-pay | Admitting: *Deleted

## 2015-12-15 NOTE — Telephone Encounter (Signed)
Please advise [name] to call the [employer] [facility](PAN) at [phone] and let them know the name of the medication and that he needs financial assistance.  They will ask him some questions to determine if he qualifies or not and go from there.  If he or you need further assistance please let me know.  Thank you!

## 2015-12-15 NOTE — Telephone Encounter (Signed)
Wife states that patient had his Praluent injection on Friday and complained Friday night of his legs hurting really bad but no complaints on Sat or Sun but complaining some yesterday.  There was a concern as to whether or not his leg pain was connected with his Praluent injections.  Wife says she just isn't sure.

## 2015-12-15 NOTE — Telephone Encounter (Signed)
Patient advised.

## 2015-12-15 NOTE — Telephone Encounter (Signed)
Patient's wife,[name],called to ask [name] to call her back.  Please call back at [phone].

## 2015-12-16 NOTE — Telephone Encounter (Signed)
I think this is likely not from the injection, especially if he didn't have it the day after the injection.  I would give another dose on schedule and see how he does in the meantime.  Thanks.

## 2015-12-16 NOTE — Telephone Encounter (Signed)
Patient's wife notified as instructed by telephone and verbalized understanding. 

## 2015-12-22 ENCOUNTER — Ambulatory Visit: Payer: Medicare HMO | Admitting: Cardiovascular Disease

## 2015-12-24 ENCOUNTER — Encounter (HOSPITAL_COMMUNITY)
Admission: AD | Disposition: A | Discharge status: Home / Self Care | Payer: Self-pay | Source: Ambulatory Visit | Attending: Internal Medicine

## 2015-12-24 ENCOUNTER — Encounter: Payer: Self-pay | Admitting: Internal Medicine

## 2015-12-24 ENCOUNTER — Ambulatory Visit (INDEPENDENT_AMBULATORY_CARE_PROVIDER_SITE_OTHER): Payer: Medicare HMO | Admitting: Internal Medicine

## 2015-12-24 ENCOUNTER — Ambulatory Visit (HOSPITAL_COMMUNITY)
Admission: AD | Admit: 2015-12-24 | Discharge: 2015-12-24 | Disposition: A | Discharge status: Home / Self Care | Payer: Medicare HMO | Source: Ambulatory Visit | Attending: Internal Medicine | Admitting: Internal Medicine

## 2015-12-24 VITALS — BP 130/78 | HR 73 | Ht 69.0 in | Wt 157.0 lb

## 2015-12-24 DIAGNOSIS — E785 Hyperlipidemia, unspecified: Secondary | ICD-10-CM | POA: Diagnosis not present

## 2015-12-24 DIAGNOSIS — H9190 Unspecified hearing loss, unspecified ear: Secondary | ICD-10-CM | POA: Diagnosis not present

## 2015-12-24 DIAGNOSIS — J449 Chronic obstructive pulmonary disease, unspecified: Secondary | ICD-10-CM | POA: Diagnosis not present

## 2015-12-24 DIAGNOSIS — D680 Von Willebrand's disease: Secondary | ICD-10-CM | POA: Diagnosis not present

## 2015-12-24 DIAGNOSIS — D735 Infarction of spleen: Secondary | ICD-10-CM | POA: Diagnosis not present

## 2015-12-24 DIAGNOSIS — K219 Gastro-esophageal reflux disease without esophagitis: Secondary | ICD-10-CM | POA: Insufficient documentation

## 2015-12-24 DIAGNOSIS — Z87891 Personal history of nicotine dependence: Secondary | ICD-10-CM | POA: Diagnosis not present

## 2015-12-24 DIAGNOSIS — R5383 Other fatigue: Secondary | ICD-10-CM | POA: Insufficient documentation

## 2015-12-24 DIAGNOSIS — Z8711 Personal history of peptic ulcer disease: Secondary | ICD-10-CM | POA: Insufficient documentation

## 2015-12-24 DIAGNOSIS — I639 Cerebral infarction, unspecified: Secondary | ICD-10-CM | POA: Diagnosis not present

## 2015-12-24 DIAGNOSIS — I10 Essential (primary) hypertension: Secondary | ICD-10-CM | POA: Insufficient documentation

## 2015-12-24 DIAGNOSIS — Z66 Do not resuscitate: Secondary | ICD-10-CM | POA: Diagnosis not present

## 2015-12-24 DIAGNOSIS — I63429 Cerebral infarction due to embolism of unspecified anterior cerebral artery: Secondary | ICD-10-CM | POA: Diagnosis not present

## 2015-12-24 DIAGNOSIS — I638 Other cerebral infarction: Secondary | ICD-10-CM | POA: Insufficient documentation

## 2015-12-24 HISTORY — PX: EP IMPLANTABLE DEVICE: SHX172B

## 2015-12-24 SURGERY — LOOP RECORDER INSERTION
Anesthesia: LOCAL

## 2015-12-24 MED ORDER — LIDOCAINE HCL (PF) 1 % IJ SOLN
INTRAMUSCULAR | Status: DC | PRN
  Administered 2015-12-24: 5 mL

## 2015-12-24 MED ORDER — LIDOCAINE-EPINEPHRINE 1 %-1:100000 IJ SOLN
INTRAMUSCULAR | Status: AC
  Filled 2015-12-24: qty 1

## 2015-12-24 SURGICAL SUPPLY — 2 items
LOOP REVEAL LINQSYS (Prosthesis & Implant Heart) ×1 IMPLANT
PACK LOOP INSERTION (CUSTOM PROCEDURE TRAY) ×2 IMPLANT

## 2015-12-24 NOTE — Progress Notes (Signed)
 ELECTROPHYSIOLOGY CONSULT NOTE  Patient ID: [name] MRN: [id number], DOB/AGE: 08/31/1925   Date of Consult: 12/24/2015  Primary Physician: Martin Duncan, MD  Reason for Consultation: Cryptogenic stroke; recommendations regarding Implantable Loop Recorder  History of Present Illness   [name] has a h/o cryptogenic stroke as well as splenic infarct. he has been monitored on telemetry which has demonstrated no arrhythmias. No cause has been identified.  He has a history of hypertension, hyperlipidemia, COPD, diverticulitis, and von Willebrand's disease.  He was admitted on 01/28/15 for dizziness and gait instability. MRI showed acute left frontal MCA/ACA punctate infarcts, as well as chronic right CR and left cerebellum. MRA head and CTA neck showed diffuse intracranial athero and left VA occlusion. Stroke work up showed normal 2D echo, but LDL 176. He was started on dual antiplatelet and recommend outpt PCSK9 inhibitor.  However, he was admitted again on 03/29/15 for dizziness, lightheadedness and gait instability, almost same as last time. MRI showed new acute infarct at right frontal cortex and subcortical WM.  He was continued on dural antiplatelet.   His complaint today is with fatigue.  He had 30 day cardiac event monitoring did not show afib. He is continued on ASA and plavix. He is now referred for possible ILR implantation.  Past Medical History Past Medical History  Diagnosis Date  . Barrett esophagus 09/2003  . GERD (gastroesophageal reflux disease)   . Hemorrhoids   . Diverticulosis   . Hyperlipidemia   . Hypertension   . PUD (peptic ulcer disease)   . Hearing loss   . GI AVM (gastrointestinal arteriovenous vascular malformation)   . History of prostatitis   . Hemorrhoids   . Macular degeneration, wet (HCC)     receiving intra-ocular injections ([facility])  . Back pain     improved with gabapentin as of 2015  . Stroke (HCC)     Past Surgical History Past Surgical  History  Procedure Laterality Date  . Inguinal hernia repair  2001  . Laparoscopic appendectomy N/A 07/07/2014    Procedure: APPENDECTOMY LAPAROSCOPIC;  Surgeon: Matthew Tsuei, MD;  Location: [facility] OR;  Service: General;  Laterality: N/A;  . Tee without cardioversion N/A 10/28/2015    Procedure: TRANSESOPHAGEAL ECHOCARDIOGRAM (TEE);  Surgeon: Dalton S McLean, MD;  Location: [facility] ENDOSCOPY;  Service: Cardiovascular;  Laterality: N/A;    Allergies/Intolerances Allergies  Allergen Reactions  . Atorvastatin     REACTION: muscles tense up   . Codeine Nausea And Vomiting  . Lisinopril Swelling    Lips and face swelling  . Statins Other (See Comments)    Muscle tense up   Inpatient Medications   Social History Social History   Social History  . Marital Status: Married    Spouse Name: Emma Belgrave  . Number of Children: N/A  . Years of Education: N/A   Occupational History  . Retired   .     Social History Main Topics  . Smoking status: Former Smoker -- 0.00 packs/day for 36 years    Types: Cigarettes  . Smokeless tobacco: Never Used     Comment: Quit in 1983  . Alcohol Use: No  . Drug Use: No  . Sexual Activity: Not on file   Other Topics Concern  . Not on file   Social History Narrative   6th grade. Married 1952 1 son, 1 dtr - died at 5 months. Work - saw-mill, tractor trailer driver, etc.   Retired-fulltime. "[employer]". ACP - yes DNR,   yes short-term mechanical ventilarion, no heroic or futile measures in face of irreversible.    Review of Systems General: No chills, fever, night sweats or weight changes  Cardiovascular:  No chest pain, dyspnea on exertion, edema, orthopnea, palpitations, paroxysmal nocturnal dyspnea Dermatological: No rash, lesions or masses Respiratory: No cough, dyspnea Urologic: No hematuria, dysuria Abdominal: No nausea, vomiting, diarrhea, bright red blood per rectum, melena, or hematemesis Neurologic: No visual changes, weakness, changes in mental  status All other systems reviewed and are otherwise negative except as noted above.  Physical Exam Blood pressure 130/78, pulse 73, height 5\' 9"  (1.753 m), weight 157 lb (71.215 kg), SpO2 99 %.  General: elderly appearing 82 y.o. male in no acute distress. HEENT: Normocephalic, atraumatic. EOMs intact. Sclera nonicteric. Oropharynx clear.  Neck: Supple without bruits. No JVD. Lungs: Respirations regular and unlabored, CTA bilaterally. No wheezes, rales or rhonchi. Heart: RRR. S1, S2 present. No murmurs, rub, S3 or S4. Abdomen: Soft, non-tender, non-distended. BS present x 4 quadrants. No hepatosplenomegaly.  Extremities: No clubbing, cyanosis or edema. DP/PT/Radials 2+ and equal bilaterally. Psych: Normal affect. Neuro: Alert and oriented X 3. Moves all extremities spontaneously. Musculoskeletal: No kyphosis. Skin: Intact. Warm and dry. No rashes or petechiae in exposed areas.   Labs Lab Results  Component Value Date   WBC 6.9 12/03/2015   HGB 10.6* 12/03/2015   HCT 33.6* 12/03/2015   MCV 76.0* 12/03/2015   PLT 335.0 12/03/2015    Echocardiogram  reviewed  12-lead ECG all ekgs in epic as well as ekg today reveal sinus rhythm, no afib is seen 30 day monitor is also reviewed   Assessment and Plan 1. Cryptogenic stroke Recurrent embolic strokes and splenic infarct are worrisome for afib as the cause. He has VWD but has been anticoagulated with dual antiplatelet therapy without difficulty.  I therefore do feel that he could take OAC if he is found to have afib. I therefore recommend loop recorder insertion to monitor for AF. The indication for loop recorder insertion / monitoring for AF in setting of cryptogenic stroke was discussed with the patient. The loop recorder insertion procedure was reviewed in detail including risks and benefits. These risks include but are not limited to bleeding and infection. The patient expressed verbal understanding and agrees to proceed. The patient  was also counseled regarding wound care and device follow-up.   2. Fatigue Will stop metoprolol which may be contributing Consider stopping diltiazem in the future  3. HTN Will need to follow-up with PCP for BP management off of metoprolol (above)  Signed, Hillis RangeJames Alexcis Bicking 12/24/2015, 9:19 AM

## 2015-12-24 NOTE — Patient Instructions (Addendum)
Medication Instructions:  Your physician has recommended you make the following change in your medication:  1) Stop Metoprolol   Labwork: None ordered   Testing/Procedures: LINQ implant today  [facility]    Follow-Up: Your physician recommends that you schedule a follow-up appointment in: 7-10 days in device clinic for wound check   Any Other Special Instructions Will Be Listed Below (If Applicable).     If you need a refill on your cardiac medications before your next appointment, please call your pharmacy.

## 2015-12-24 NOTE — Interval H&P Note (Signed)
History and Physical Interval Note:  12/24/2015 10:56 AM  [name]  has presented today for surgery, with the diagnosis of stroke  The various methods of treatment have been discussed with the patient and family. After consideration of risks, benefits and other options for treatment, the patient has consented to  Procedure(s): Loop Recorder Insertion (N/A) as a surgical intervention .  The patient's history has been reviewed, patient examined, no change in status, stable for surgery.  I have reviewed the patient's chart and labs.  Questions were answered to the patient's satisfaction.     Hillis RangeJames Shirrell Solinger

## 2015-12-24 NOTE — H&P (View-Only) (Signed)
ELECTROPHYSIOLOGY CONSULT NOTE  Patient ID: [name] MRN: [id number], DOB/AGE: [birth year]   Date of Consult: 12/24/2015  Primary Physician: [name], MD  Reason for Consultation: Cryptogenic stroke; recommendations regarding Implantable Loop Recorder  History of Present Illness   [name] has a h/o cryptogenic stroke as well as splenic infarct. he has been monitored on telemetry which has demonstrated no arrhythmias. No cause has been identified.  He has a history of hypertension, hyperlipidemia, COPD, diverticulitis, and von Willebrand's disease.  He was admitted on 01/28/15 for dizziness and gait instability. MRI showed acute left frontal MCA/ACA punctate infarcts, as well as chronic right CR and left cerebellum. MRA head and CTA neck showed diffuse intracranial athero and left VA occlusion. Stroke work up showed normal 2D echo, but LDL 176. He was started on dual antiplatelet and recommend outpt PCSK9 inhibitor.  However, he was admitted again on 03/29/15 for dizziness, lightheadedness and gait instability, almost same as last time. MRI showed new acute infarct at right frontal cortex and subcortical WM.  He was continued on dural antiplatelet.   His complaint today is with fatigue.  He had 30 day cardiac event monitoring did not show afib. He is continued on ASA and plavix. He is now referred for possible ILR implantation.  Past Medical History Past Medical History  Diagnosis Date  . Barrett esophagus 09/2003  . GERD (gastroesophageal reflux disease)   . Hemorrhoids   . Diverticulosis   . Hyperlipidemia   . Hypertension   . PUD (peptic ulcer disease)   . Hearing loss   . GI AVM (gastrointestinal arteriovenous vascular malformation)   . History of prostatitis   . Hemorrhoids   . Macular degeneration, wet (HCC)     receiving intra-ocular injections ([facility])  . Back pain     improved with gabapentin as of 2015  . Stroke [facility])     Past Surgical History Past Surgical  History  Procedure Laterality Date  . Inguinal hernia repair  12/27/1999  . Laparoscopic appendectomy N/A 07/07/2014    Procedure: APPENDECTOMY LAPAROSCOPIC;  Surgeon: Manus Rudd, MD;  Location: [facility] OR;  Service: General;  Laterality: N/A;  . Tee without cardioversion N/A 10/28/2015    Procedure: TRANSESOPHAGEAL ECHOCARDIOGRAM (TEE);  Surgeon: Laurey Morale, MD;  Location[facility] ENDOSCOPY;  Service: Cardiovascular;  Laterality: N/A;    Allergies/Intolerances Allergies  Allergen Reactions  . Atorvastatin     REACTION: muscles tense up   . Codeine Nausea And Vomiting  . Lisinopril Swelling    Lips and face swelling  . Statins Other (See Comments)    Muscle tense up   Inpatient Medications   Social History Social History   Social History  . Marital Status: Married    Spouse Name: [name]  . Number of Children: N/A  . Years of Education: N/A   Occupational History  . Retired   .     Social History Main Topics  . Smoking status: Former Smoker -- 0.00 packs/day for 36 years    Types: Cigarettes  . Smokeless tobacco: Never Used     Comment: Quit in 12/26/1981  . Alcohol Use: No  . Drug Use: No  . Sexual Activity: Not on file   Other Topics Concern  . Not on file   Social History Narrative   6th grade. Married 12-27-1950 1 son, 1 dtr - died at 5 months. Work - Management consultant, Engineer, drilling, Catering manager.   Retired-fulltime. "[employer]". ACP - yes DNR,  yes short-term mechanical ventilarion, no heroic or futile measures in face of irreversible.    Review of Systems General: No chills, fever, night sweats or weight changes  Cardiovascular:  No chest pain, dyspnea on exertion, edema, orthopnea, palpitations, paroxysmal nocturnal dyspnea Dermatological: No rash, lesions or masses Respiratory: No cough, dyspnea Urologic: No hematuria, dysuria Abdominal: No nausea, vomiting, diarrhea, bright red blood per rectum, melena, or hematemesis Neurologic: No visual changes, weakness, changes in mental  status All other systems reviewed and are otherwise negative except as noted above.  Physical Exam Blood pressure 130/78, pulse 73, height 5\' 9"  (1.753 m), weight 157 lb (71.215 kg), SpO2 99 %.  General: elderly appearing 82 y.o. male in no acute distress. HEENT: Normocephalic, atraumatic. EOMs intact. Sclera nonicteric. Oropharynx clear.  Neck: Supple without bruits. No JVD. Lungs: Respirations regular and unlabored, CTA bilaterally. No wheezes, rales or rhonchi. Heart: RRR. S1, S2 present. No murmurs, rub, S3 or S4. Abdomen: Soft, non-tender, non-distended. BS present x 4 quadrants. No hepatosplenomegaly.  Extremities: No clubbing, cyanosis or edema. DP/PT/Radials 2+ and equal bilaterally. Psych: Normal affect. Neuro: Alert and oriented X 3. Moves all extremities spontaneously. Musculoskeletal: No kyphosis. Skin: Intact. Warm and dry. No rashes or petechiae in exposed areas.   Labs Lab Results  Component Value Date   WBC 6.9 12/03/2015   HGB 10.6* 12/03/2015   HCT 33.6* 12/03/2015   MCV 76.0* 12/03/2015   PLT 335.0 12/03/2015    Echocardiogram  reviewed  12-lead ECG all ekgs in epic as well as ekg today reveal sinus rhythm, no afib is seen 30 day monitor is also reviewed   Assessment and Plan 1. Cryptogenic stroke Recurrent embolic strokes and splenic infarct are worrisome for afib as the cause. He has VWD but has been anticoagulated with dual antiplatelet therapy without difficulty.  I therefore do feel that he could take OAC if he is found to have afib. I therefore recommend loop recorder insertion to monitor for AF. The indication for loop recorder insertion / monitoring for AF in setting of cryptogenic stroke was discussed with the patient. The loop recorder insertion procedure was reviewed in detail including risks and benefits. These risks include but are not limited to bleeding and infection. The patient expressed verbal understanding and agrees to proceed. The patient  was also counseled regarding wound care and device follow-up.   2. Fatigue Will stop metoprolol which may be contributing Consider stopping diltiazem in the future  3. HTN Will need to follow-up with PCP for BP management off of metoprolol (above)  Signed, Hillis RangeJames Leman Martinek 12/24/2015, 9:19 AM

## 2015-12-24 NOTE — Discharge Instructions (Signed)
Discharge instructions given to pt and family verbally and in writing. Both verbalize understanding

## 2015-12-31 ENCOUNTER — Ambulatory Visit (INDEPENDENT_AMBULATORY_CARE_PROVIDER_SITE_OTHER): Payer: Medicare HMO | Admitting: *Deleted

## 2015-12-31 ENCOUNTER — Encounter: Payer: Self-pay | Admitting: Internal Medicine

## 2015-12-31 DIAGNOSIS — I63429 Cerebral infarction due to embolism of unspecified anterior cerebral artery: Secondary | ICD-10-CM

## 2015-12-31 LAB — CUP PACEART INCLINIC DEVICE CHECK: Date Time Interrogation Session: 20170511122850

## 2015-12-31 NOTE — Progress Notes (Signed)
Wound check s/p ILR implant. Loop check in clinic. Battery status: good. R-waves 0.546mV. 0 symptom episodes, 0 tachy episodes, 0 pause episodes, 0 brady episodes. 0 AF episodes. Monthly summary reports and ROV with JA PRN. Wound check appointment. Steri-strips removed. Wound without redness or edema. Incision edges approximated, wound well healed.

## 2016-01-04 ENCOUNTER — Telehealth: Payer: Self-pay

## 2016-01-04 NOTE — Telephone Encounter (Signed)
Ms [name] left v/m requesting cb(DPR signed); pt was seen 12/03/15 with bilateral knee pain; pain no better and wants to know if cholesterol med might be causing the pain.

## 2016-01-04 NOTE — Telephone Encounter (Signed)
Wife advised. 

## 2016-01-04 NOTE — Telephone Encounter (Signed)
I think not, but is still isn't clearly.  I would skip the next scheduled dose and see how he does in the meantime.  Please update me at that point.  Thanks.

## 2016-01-08 ENCOUNTER — Other Ambulatory Visit: Payer: Self-pay | Admitting: *Deleted

## 2016-01-08 MED ORDER — SUCRALFATE 1 G PO TABS
ORAL_TABLET | ORAL | Status: DC
Start: 2016-01-08 — End: 2016-03-08

## 2016-01-13 ENCOUNTER — Institutional Professional Consult (permissible substitution): Payer: Medicare HMO | Admitting: Internal Medicine

## 2016-01-21 ENCOUNTER — Encounter: Payer: Self-pay | Admitting: Gastroenterology

## 2016-01-25 ENCOUNTER — Ambulatory Visit (INDEPENDENT_AMBULATORY_CARE_PROVIDER_SITE_OTHER): Payer: Medicare HMO | Admitting: *Deleted

## 2016-01-25 DIAGNOSIS — I63429 Cerebral infarction due to embolism of unspecified anterior cerebral artery: Secondary | ICD-10-CM | POA: Diagnosis not present

## 2016-01-26 NOTE — Progress Notes (Signed)
Carelink Summary Report / Loop Recorder 

## 2016-01-27 ENCOUNTER — Ambulatory Visit (INDEPENDENT_AMBULATORY_CARE_PROVIDER_SITE_OTHER): Payer: Medicare HMO | Admitting: Family Medicine

## 2016-01-27 ENCOUNTER — Encounter: Payer: Self-pay | Admitting: Family Medicine

## 2016-01-27 VITALS — BP 118/70 | HR 76 | Temp 97.8°F | Ht 69.0 in | Wt 155.5 lb

## 2016-01-27 DIAGNOSIS — M79606 Pain in leg, unspecified: Secondary | ICD-10-CM | POA: Diagnosis not present

## 2016-01-27 DIAGNOSIS — M79604 Pain in right leg: Secondary | ICD-10-CM

## 2016-01-27 DIAGNOSIS — E538 Deficiency of other specified B group vitamins: Secondary | ICD-10-CM | POA: Diagnosis not present

## 2016-01-27 DIAGNOSIS — M79605 Pain in left leg: Secondary | ICD-10-CM

## 2016-01-27 LAB — COMPREHENSIVE METABOLIC PANEL
ALT: 9 U/L (ref 0–53)
AST: 17 U/L (ref 0–37)
Albumin: 4.1 g/dL (ref 3.5–5.2)
Alkaline Phosphatase: 85 U/L (ref 39–117)
BUN: 21 mg/dL (ref 6–23)
CO2: 29 mEq/L (ref 19–32)
Calcium: 9.5 mg/dL (ref 8.4–10.5)
Chloride: 102 mEq/L (ref 96–112)
Creatinine, Ser: 1.47 mg/dL (ref 0.40–1.50)
GFR: 48.02 mL/min — ABNORMAL LOW (ref >60.00)
Glucose, Bld: 116 mg/dL — ABNORMAL HIGH (ref 70–99)
Potassium: 3.5 mEq/L (ref 3.5–5.1)
Sodium: 138 mEq/L (ref 135–145)
Total Bilirubin: 0.3 mg/dL (ref 0.2–1.2)
Total Protein: 7.3 g/dL (ref 6.0–8.3)

## 2016-01-27 LAB — CBC WITH DIFFERENTIAL/PLATELET
Basophils Absolute: 0 10*3/uL (ref 0.0–0.1)
Basophils Relative: 0.6 % (ref 0.0–3.0)
Eosinophils Absolute: 0.1 10*3/uL (ref 0.0–0.7)
Eosinophils Relative: 1.4 % (ref 0.0–5.0)
HCT: 32.8 % — ABNORMAL LOW (ref 39.0–52.0)
Hemoglobin: 10.3 g/dL — ABNORMAL LOW (ref 13.0–17.0)
Lymphocytes Relative: 19.4 % (ref 12.0–46.0)
Lymphs Abs: 1.2 10*3/uL (ref 0.7–4.0)
MCHC: 31.2 g/dL (ref 30.0–36.0)
MCV: 71.7 fl — ABNORMAL LOW (ref 78.0–100.0)
Monocytes Absolute: 0.7 10*3/uL (ref 0.1–1.0)
Monocytes Relative: 11.1 % (ref 3.0–12.0)
Neutro Abs: 4.2 10*3/uL (ref 1.4–7.7)
Neutrophils Relative %: 67.5 % (ref 43.0–77.0)
Platelets: 383 10*3/uL (ref 150.0–400.0)
RBC: 4.58 Mil/uL (ref 4.22–5.81)
RDW: 16.8 % — ABNORMAL HIGH (ref 11.5–15.5)
WBC: 6.3 10*3/uL (ref 4.0–10.5)

## 2016-01-27 LAB — VITAMIN B12: Vitamin B-12: 193 pg/mL — ABNORMAL LOW (ref 211–911)

## 2016-01-27 LAB — CK: Total CK: 103 U/L (ref 7–232)

## 2016-01-27 LAB — SEDIMENTATION RATE: Sed Rate: 38 mm/hr — ABNORMAL HIGH (ref 0–20)

## 2016-01-27 LAB — TSH: TSH: 2.59 u[IU]/mL (ref 0.35–4.50)

## 2016-01-27 NOTE — Patient Instructions (Signed)
Go to the lab on the way out.  We'll contact you with your lab report. Restart the praluent injection.  If the aches don't get worse and the labs are unremarkable, then stop the gemfibrozil next week and update me.  Take care.  Glad to see you.

## 2016-01-27 NOTE — Addendum Note (Signed)
Addended by: Remus BlakeBARROW, Florabelle Cardin K on: 01/27/2016 03:15 PM   Modules accepted: Orders

## 2016-01-27 NOTE — Progress Notes (Signed)
Pre visit review using our clinic review tool, if applicable. No additional management support is needed unless otherwise documented below in the visit note. 

## 2016-01-27 NOTE — Progress Notes (Signed)
Pre visit review using our clinic review tool, if applicable. No additional management support is needed unless otherwise documented below in the visit note.  Stopping the PRALUENT injection didn't help his aches.  He's been off for about 1 month and still aching in the B legs down to the toes.  Aches, and feels like a pens and needle sensation, circumferential.  Worse at night.  Tramadol clearly helps some, used occ at night.  Not in the hands are arms.  Gabapentin helps his back pain but doesn't leg pains.  He doesn't have joint redness in the BLE.  He doesn't have jumpy legs at night. The knees are most painful.    Prev knee xray: The right knee joint spaces are well preserved for age. No fracture is seen. No joint effusion is noted.  D/w pt about med cause vs neuro w/u.    Meds, vitals, and allergies reviewed.   ROS: Per HPI unless specifically indicated in ROS section   nad ncat rrr ctab Abd soft not ttp Ext w/o edema Sensation intact in BLE, no motor deficits in BLE

## 2016-01-28 ENCOUNTER — Other Ambulatory Visit: Payer: Self-pay | Admitting: Family Medicine

## 2016-01-28 DIAGNOSIS — M79606 Pain in leg, unspecified: Secondary | ICD-10-CM | POA: Insufficient documentation

## 2016-01-28 DIAGNOSIS — E538 Deficiency of other specified B group vitamins: Secondary | ICD-10-CM | POA: Insufficient documentation

## 2016-01-28 MED ORDER — VITAMIN B-12 1000 MCG PO TABS: 2000.0000 ug | ORAL_TABLET | Freq: Every day | ORAL | Status: DC

## 2016-01-28 NOTE — Assessment & Plan Note (Signed)
>  25 minutes spent in face to face time with patient, >50% spent in counselling or coordination of care Initial plan, restart the praluent injection.  If the aches don't get worse and the labs are unremarkable, then stop the gemfibrozil next week and update me.  However, low B12 noted, likely the issue.  See notes on labs.

## 2016-01-28 NOTE — Assessment & Plan Note (Signed)
See notes on labs. 

## 2016-02-01 ENCOUNTER — Telehealth: Payer: Self-pay

## 2016-02-01 NOTE — Telephone Encounter (Signed)
Mrs [name] left v/m requesting cb and wants to know if Praluent can cause dizziness and lightheadedness. Please advise.

## 2016-02-01 NOTE — Telephone Encounter (Signed)
Not likely.  What is his BP/pulse?  We may need to address that.  Let me know.  Thanks.

## 2016-02-02 NOTE — Telephone Encounter (Signed)
Wife advised.  Wife says it began yesterday a few hours after getting the shot and lasted until this morning.  Patient is feeling well today.  Patient's BP yesterday was 155/69 and pulse was 77.  Patient has not taken his vitals this morning.

## 2016-02-02 NOTE — Telephone Encounter (Signed)
I would give this 1 week and see if recurrent.  We can go from there.   Even if it didn't happen while he was off the med, that doesn't mean for certain that it is due to the med now.  Thanks.

## 2016-02-02 NOTE — Telephone Encounter (Signed)
Given the half life of the medicine, I don't think it would happen from the medicine yesterday and still manage to be better today.   If recurrent, then let me know. Thanks.

## 2016-02-02 NOTE — Telephone Encounter (Signed)
Wife has spoken with the representative at the manufacturer and the rep told her that it could be from the medication and suggested that maybe the dosage be lowered to 75 mg rather than 150 mg.  I told her I would share this info with you but that the lower dose may not be as effective for the issue that it is treating either.  She agreed and stated that the month that he was off of the medication, he didn't feel this way.

## 2016-02-02 NOTE — Telephone Encounter (Signed)
Wife advised. 

## 2016-02-08 ENCOUNTER — Telehealth: Payer: Self-pay | Admitting: Internal Medicine

## 2016-02-08 NOTE — Telephone Encounter (Signed)
Informed wife that no episodes have been recorded. Wife voiced concerns bc she said that patient does have "skipped beats" per his home BP cuff. I explained to her that patient is not having any sustained episodes. I offered wife/patient symptom activator to record episodes of "skipped beats" for review. Wife thought that the activator would help a great deal.   Email sent to [employer] customer support.

## 2016-02-08 NOTE — Telephone Encounter (Signed)
ENw MEssage  Pt wife requested to speak w/ RN cocnerning pt's loop recorder; Please call back and discuss.

## 2016-02-09 ENCOUNTER — Other Ambulatory Visit: Payer: Self-pay | Admitting: Family Medicine

## 2016-02-09 NOTE — Telephone Encounter (Signed)
Rx called in as directed.   

## 2016-02-09 NOTE — Telephone Encounter (Signed)
Ok to refill in Dr. Ashok Cordia's absence? Last filled 12/03/15 #30 1 RF.

## 2016-02-22 ENCOUNTER — Emergency Department (HOSPITAL_COMMUNITY): Payer: Medicare HMO

## 2016-02-22 ENCOUNTER — Emergency Department (HOSPITAL_COMMUNITY)
Admission: EM | Admit: 2016-02-22 | Discharge: 2016-02-23 | Disposition: A | Discharge status: Home / Self Care | Payer: Medicare HMO | Attending: Emergency Medicine | Admitting: Emergency Medicine

## 2016-02-22 ENCOUNTER — Encounter (HOSPITAL_COMMUNITY): Payer: Self-pay | Admitting: Emergency Medicine

## 2016-02-22 ENCOUNTER — Ambulatory Visit (INDEPENDENT_AMBULATORY_CARE_PROVIDER_SITE_OTHER): Payer: Medicare HMO | Admitting: *Deleted

## 2016-02-22 DIAGNOSIS — I10 Essential (primary) hypertension: Secondary | ICD-10-CM | POA: Insufficient documentation

## 2016-02-22 DIAGNOSIS — I63429 Cerebral infarction due to embolism of unspecified anterior cerebral artery: Secondary | ICD-10-CM | POA: Diagnosis not present

## 2016-02-22 DIAGNOSIS — R072 Precordial pain: Secondary | ICD-10-CM | POA: Diagnosis present

## 2016-02-22 DIAGNOSIS — Z79899 Other long term (current) drug therapy: Secondary | ICD-10-CM | POA: Diagnosis not present

## 2016-02-22 DIAGNOSIS — Z8673 Personal history of transient ischemic attack (TIA), and cerebral infarction without residual deficits: Secondary | ICD-10-CM | POA: Insufficient documentation

## 2016-02-22 DIAGNOSIS — Z7901 Long term (current) use of anticoagulants: Secondary | ICD-10-CM | POA: Insufficient documentation

## 2016-02-22 DIAGNOSIS — Z7982 Long term (current) use of aspirin: Secondary | ICD-10-CM | POA: Insufficient documentation

## 2016-02-22 DIAGNOSIS — Z87891 Personal history of nicotine dependence: Secondary | ICD-10-CM | POA: Diagnosis not present

## 2016-02-22 DIAGNOSIS — R0789 Other chest pain: Secondary | ICD-10-CM | POA: Insufficient documentation

## 2016-02-22 DIAGNOSIS — R079 Chest pain, unspecified: Secondary | ICD-10-CM

## 2016-02-22 LAB — BASIC METABOLIC PANEL
Anion gap: 6 (ref 5–15)
BUN: 13 mg/dL (ref 6–20)
CO2: 25 mmol/L (ref 22–32)
Calcium: 8.8 mg/dL — ABNORMAL LOW (ref 8.9–10.3)
Chloride: 105 mmol/L (ref 101–111)
Creatinine, Ser: 1.17 mg/dL (ref 0.61–1.24)
GFR calc Af Amer: >60 mL/min (ref >60)
GFR calc non Af Amer: 54 mL/min — ABNORMAL LOW (ref >60)
Glucose, Bld: 112 mg/dL — ABNORMAL HIGH (ref 65–99)
Potassium: 3.7 mmol/L (ref 3.5–5.1)
Sodium: 136 mmol/L (ref 135–145)

## 2016-02-22 LAB — CBC
HCT: 32.5 % — ABNORMAL LOW (ref 39.0–52.0)
Hemoglobin: 9.5 g/dL — ABNORMAL LOW (ref 13.0–17.0)
MCH: 21.4 pg — ABNORMAL LOW (ref 26.0–34.0)
MCHC: 29.2 g/dL — ABNORMAL LOW (ref 30.0–36.0)
MCV: 73.4 fL — ABNORMAL LOW (ref 78.0–100.0)
Platelets: 321 10*3/uL (ref 150–400)
RBC: 4.43 MIL/uL (ref 4.22–5.81)
RDW: 16.7 % — ABNORMAL HIGH (ref 11.5–15.5)
WBC: 5.5 10*3/uL (ref 4.0–10.5)

## 2016-02-22 LAB — PROTIME-INR
INR: 1.06 (ref 0.00–1.49)
Prothrombin Time: 14 seconds (ref 11.6–15.2)

## 2016-02-22 LAB — I-STAT TROPONIN, ED: Troponin i, poc: 0.01 ng/mL (ref 0.00–0.08)

## 2016-02-22 NOTE — ED Provider Notes (Signed)
CSN: [id number]     Arrival date & time 02/22/16  2107 History   First MD Initiated Contact with Patient 02/22/16 2109     Chief Complaint  Patient presents with  . Chest Pain     (Consider location/radiation/quality/duration/timing/severity/associated sxs/prior Treatment) Patient is a 82 y.o. male presenting with chest pain.  Chest Pain Pain location:  Substernal area Pain quality: sharp   Pain radiates to:  Does not radiate Pain radiates to the back: no   Pain severity:  Mild Progression:  Resolved Chronicity:  Recurrent Context: not breathing   Associated symptoms: no cough, no fever and no shortness of breath     Past Medical History  Diagnosis Date  . Barrett esophagus 09/2003  . GERD (gastroesophageal reflux disease)   . Hemorrhoids   . Diverticulosis   . Hyperlipidemia   . Hypertension   . PUD (peptic ulcer disease)   . Hearing loss   . GI AVM (gastrointestinal arteriovenous vascular malformation)   . History of prostatitis   . Hemorrhoids   . Macular degeneration, wet (HCC)     receiving intra-ocular injections ([place])  . Back pain     improved with gabapentin as of 2015  . Stroke [facility](HCC)    Past Surgical History  Procedure Laterality Date  . Inguinal hernia repair  2001  . Laparoscopic appendectomy N/A 07/07/2014    Procedure: APPENDECTOMY LAPAROSCOPIC;  Surgeon: Manus RuddMatthew Tsuei, MD;  Location: [facility] OR;  Service: General;  Laterality: N/A;  . Tee without cardioversion N/A 10/28/2015    Procedure: TRANSESOPHAGEAL ECHOCARDIOGRAM (TEE);  Surgeon: Laurey Moralealton S McLean, MD;  [facility];  Service: Cardiovascular;  Laterality: N/A;  . Ep implantable device N/A 12/24/2015    Procedure: Loop Recorder Insertion;  Surgeon: Hillis RangeJames Allred, MD;  Location: [facility] INVASIVE CV LAB;  Service: Cardiovascular;  Laterality: N/A;   Family History  Problem Relation Age of Onset  . Heart attack Father   . Coronary artery disease Father   . Hypertension Mother   . Pancreatic cancer  Mother     Died at 6586.  . Cancer Mother     Pancreatic cancer  . Arthritis Sister   . Hyperlipidemia Sister   . Prostate cancer Brother   . Cancer Brother     Bone Cancer secondary to Prostate Cancer  . Diabetes Neg Hx   . Bleeding Disorder Son    Social History  Substance Use Topics  . Smoking status: Former Smoker -- 0.00 packs/day for 36 years    Types: Cigarettes  . Smokeless tobacco: Never Used     Comment: Quit in 1983  . Alcohol Use: No    Review of Systems  Constitutional: Negative for fever and chills.  Eyes: Negative for pain.  Respiratory: Negative for cough and shortness of breath.   Cardiovascular: Positive for chest pain.  All other systems reviewed and are negative.     Allergies  Atorvastatin; Codeine; Lisinopril; and Statins  Home Medications   Prior to Admission medications   Medication Sig Start Date End Date Taking? Authorizing Provider  acetaminophen (TYLENOL) 500 MG tablet Take 1,000 mg by mouth at bedtime.    Yes Historical Provider, MD  Alirocumab (PRALUENT) 150 MG/ML SOSY Inject 150 mg into the skin every 14 (fourteen) days. 10/07/15  Yes Joaquim NamGraham S Duncan, MD  aspirin EC 81 MG tablet Take 1 tablet (81 mg total) by mouth daily. 10/29/15  Yes Osvaldo ShipperGokul Krishnan, MD  Bevacizumab (AVASTIN IV) Place 1 Applicatorful into both eyes  See admin instructions. Right eye every 9 weeks Left eye every 6 weeks.   Yes Historical Provider, MD  cetirizine (ZYRTEC) 10 MG tablet Take 10 mg by mouth daily as needed for allergies.    Yes Historical Provider, MD  clopidogrel (PLAVIX) 75 MG tablet Take 1 tablet (75 mg total) by mouth daily. 08/21/15  Yes Joaquim NamGraham S Duncan, MD  diltiazem (DILACOR XR) 180 MG 24 hr capsule Take 1 capsule (180 mg total) by mouth daily. 08/21/15  Yes Joaquim NamGraham S Duncan, MD  fluticasone (FLONASE) 50 MCG/ACT nasal spray Place 2 sprays into both nostrils daily as needed for allergies. 08/21/15  Yes Joaquim NamGraham S Duncan, MD  gabapentin (NEURONTIN) 300 MG capsule  TAKE ONE CAPSULE BY MOUTH TWICE A DAY 03/25/15  Yes Joaquim NamGraham S Duncan, MD  hydrochlorothiazide (HYDRODIURIL) 25 MG tablet Take 1 tablet (25 mg total) by mouth daily. 09/15/15  Yes Joaquim NamGraham S Duncan, MD  Hypromellose (ARTIFICIAL TEARS OP) Apply 1 drop to eye daily as needed (itchy / watery eyes).   Yes Historical Provider, MD  loperamide (IMODIUM) 2 MG capsule Take 1 capsule (2 mg total) by mouth 3 (three) times daily as needed for diarrhea or loose stools. 07/15/14  Yes Elease EtienneAnand D Hongalgi, MD  meclizine (ANTIVERT) 25 MG tablet Take 0.5 tablets (12.5 mg total) by mouth 3 (three) times daily as needed for dizziness. 12/03/15  Yes Joaquim NamGraham S Duncan, MD  Multiple Vitamins-Minerals (OCUVITE ADULT 50+ PO) Take 1 capsule by mouth daily.     Yes Historical Provider, MD  ranitidine (ZANTAC) 150 MG tablet Take 1 tablet (150 mg total) by mouth 2 (two) times daily. 07/27/15  Yes Joaquim NamGraham S Duncan, MD  sucralfate (CARAFATE) 1 g tablet TAKE 1 TABLET BY MOUTH BEFORE LAYING DOWN FOR NAP OR BEDTIME Patient taking differently: Take 1 g by mouth daily as needed (TAKE 1 TABLET BY MOUTH BEFORE LAYING DOWN FOR NAP OR  AS NEEDED).  01/08/16  Yes Joaquim NamGraham S Duncan, MD  tamsulosin (FLOMAX) 0.4 MG CAPS capsule Take 0.4 mg by mouth daily.  10/26/15  Yes Historical Provider, MD  traMADol (ULTRAM) 50 MG tablet TAKE ONE TABLET BY MOUTH EVERY EIGHT HOURS AS NEEDED Patient taking differently: TAKE ONE TABLET BY MOUTH EVERY EIGHT HOURS AS NEEDED FOR PAIN 02/09/16  Yes Doreene NestKatherine K Clark, NP  vitamin B-12 (CYANOCOBALAMIN) 1000 MCG tablet Take 2 tablets (2,000 mcg total) by mouth daily. 01/28/16  Yes Joaquim NamGraham S Duncan, MD  gemfibrozil (LOPID) 600 MG tablet Take 1 tablet (600 mg total) by mouth 2 (two) times daily. Patient not taking: Reported on 02/22/2016 08/12/15   Joaquim NamGraham S Duncan, MD   BP 148/86 mmHg  Pulse 67  Temp(Src) 97.8 F (36.6 C)  Resp 15  Ht 5\' 7"  (1.702 m)  Wt 160 lb (72.576 kg)  BMI 25.05 kg/m2  SpO2 100% Physical Exam  Constitutional: He  is oriented to person, place, and time. He appears well-developed and well-nourished.  HENT:  Head: Normocephalic and atraumatic.  Eyes: Pupils are equal, round, and reactive to light.  Neck: Normal range of motion.  Cardiovascular: Normal rate.   Pulmonary/Chest: Effort normal. No respiratory distress. He has no rales.  Abdominal: Soft. He exhibits no distension. There is no tenderness.  Musculoskeletal: Normal range of motion. He exhibits no edema or tenderness.  Neurological: He is alert and oriented to person, place, and time. No cranial nerve deficit.  Skin: Skin is warm and dry.  Nursing note and vitals reviewed.   ED Course  Procedures (including critical care time) Labs Review Labs Reviewed  BASIC METABOLIC PANEL - Abnormal; Notable for the following:    Glucose, Bld 112 (*)    Calcium 8.8 (*)    GFR calc non Af Amer 54 (*)    All other components within normal limits  CBC - Abnormal; Notable for the following:    Hemoglobin 9.5 (*)    HCT 32.5 (*)    MCV 73.4 (*)    MCH 21.4 (*)    MCHC 29.2 (*)    RDW 16.7 (*)    All other components within normal limits  PROTIME-INR  I-STAT TROPOININ, ED  I-STAT TROPOININ, ED    Imaging Review Dg Chest 2 View  02/22/2016  CLINICAL DATA:  Left-sided chest pain beginning 1 hour ago. EXAM: CHEST  2 VIEW COMPARISON:  08/14/2015 FINDINGS: Heart size is the upper limits of normal. Aortic atherosclerosis. Lungs are clear. Pulmonary vascularity is normal. No effusions. No significant bone finding. IMPRESSION: No active disease.  Aortic atherosclerosis. Electronically Signed   By: Paulina Fusi M.D.   On: 02/22/2016 21:55   I have personally reviewed and evaluated these images and lab results as part of my medical decision-making.   EKG Interpretation   Date/Time:  Tuesday February 23 2016 01:02:19 EDT Ventricular Rate:  65 PR Interval:    QRS Duration: 96 QT Interval:  406 QTC Calculation: 423 R Axis:   48 Text Interpretation:  Sinus  rhythm Confirmed by [facility] MD, Barbara Cower [phone])  on 02/23/2016 1:25:57 AM Also confirmed by [facility] MD, Desiderio Dolata [phone]), editor  Whitney Post, Cala Bradford [phone])  on 02/23/2016 10:48:41 AM      MDM   Final diagnoses:  Chest pain, unspecified chest pain type    Atypical chest pain. No EKG changes. Troponin negative. We'll need delta troponins and a repeat EKG. Doubt ACS at this time. Repeat ecg's without evolving changes. troponins negative. Discussed hospital observation, however patient preferred to go home and follow up with cardiologist. 2/2 negative troponins and non-ischemic ecg I felt that was appropriate, however will return here for severe or worsening symptoms.   New Prescriptions: Discharge Medication List as of 02/23/2016  1:29 AM       I have personally and contemperaneously reviewed labs and imaging and used in my decision making as above.   A medical screening exam was performed and I feel the patient has had an appropriate workup for their chief complaint at this time and likelihood of emergent condition existing is low and thus workup can continue on an outpatient basis.. Their vital signs are stable. They have been counseled on decision, discharge, follow up and which symptoms necessitate immediate return to the emergency department.  They verbally stated understanding and agreement with plan and discharged in stable condition.    Marily Memos, MD 02/24/16 [phone]

## 2016-02-22 NOTE — Progress Notes (Signed)
Carelink Summary Report / Loop Recorder 

## 2016-02-22 NOTE — ED Notes (Signed)
Pt in EMS reports L sided CP started 1 hr ago, describes as tight, rates 5/10. EMS gave 324ASA, 2NTG pain now 0/10.

## 2016-02-23 LAB — I-STAT TROPONIN, ED: Troponin i, poc: 0 ng/mL (ref 0.00–0.08)

## 2016-02-24 LAB — CUP PACEART REMOTE DEVICE CHECK: Date Time Interrogation Session: 20170603143616

## 2016-02-25 ENCOUNTER — Encounter: Payer: Self-pay | Admitting: Family Medicine

## 2016-02-25 ENCOUNTER — Ambulatory Visit (INDEPENDENT_AMBULATORY_CARE_PROVIDER_SITE_OTHER): Payer: Medicare HMO | Admitting: Family Medicine

## 2016-02-25 ENCOUNTER — Telehealth: Payer: Self-pay | Admitting: Internal Medicine

## 2016-02-25 VITALS — BP 148/60 | HR 76 | Temp 97.6°F | Wt 154.5 lb

## 2016-02-25 DIAGNOSIS — E538 Deficiency of other specified B group vitamins: Secondary | ICD-10-CM | POA: Diagnosis not present

## 2016-02-25 DIAGNOSIS — M79606 Pain in leg, unspecified: Secondary | ICD-10-CM | POA: Diagnosis not present

## 2016-02-25 DIAGNOSIS — R0789 Other chest pain: Secondary | ICD-10-CM

## 2016-02-25 LAB — VITAMIN B12: Vitamin B-12: 470 pg/mL (ref 211–911)

## 2016-02-25 MED ORDER — CYANOCOBALAMIN 1000 MCG/ML IJ SOLN
1000.0000 ug | Freq: Once | INTRAMUSCULAR | Status: AC
  Administered 2016-02-25: 1000 ug via INTRAMUSCULAR

## 2016-02-25 NOTE — Telephone Encounter (Signed)
New message   Pt wife is calling to inform Dr.Alllred that pt was in the hospital 02-22-16   She wants to know if he has any further instructions

## 2016-02-25 NOTE — Patient Instructions (Addendum)
Call the cardiac clinic about routine follow up.   B12 level, then B12 shot ( ) on the way out.  If not better with that, then we'll check on getting your set up with neurology.  Take care.  Glad to see you.

## 2016-02-25 NOTE — Progress Notes (Signed)
Pre visit review using our clinic review tool, if applicable. No additional management support is needed unless otherwise documented below in the visit note.  Monday night with L sided chest pain.  To ER but neg w/u at ER.  Discharged home.  No pain in the meantime, resolved by the time he got to the ER.  Not SOB.  He still has implanted loop recorder.   More concerning to patient was his leg pain.  Adding on B12 didn't help his sx.  D/w pt to continue anyway, given the prev leg pains.   Still with leg cramps, pain and burning, episodically.  Prev w/u only noted for low B 12 level.  D/w pt.   Meds, vitals, and allergies reviewed.   ROS: Per HPI unless specifically indicated in ROS section   GEN: nad, alert and oriented HEENT: mucous membranes moist NECK: supple w/o LA CV: rrr.  PULM: ctab, no inc wob ABD: soft, +bs EXT: no edema SKIN: no acute rash S/S grossly wnl BLE

## 2016-02-25 NOTE — Telephone Encounter (Signed)
Pts wife is calling, as instructed by the pts PCP Dr Para Marchuncan, to inform Dr Johney FrameAllred that the pt was in the ER on 02/22/16 and does he want the pt to come in for a follow-up appt, regarding his ER visit.  Wife states that they diagnosed the pt with low vitamin B12, but Dr Para Marchuncan did want Cardiology to see the pt as well, if necessary.  Informed the pts wife that Dr Johney FrameAllred and RN are out of the office, but I will route this message to both of them for further review and recommendation and follow-up with both parties thereafter.  Wife verbalized understanding and agrees with this plan.  Wife gracious for all the assistance provided.

## 2016-02-26 DIAGNOSIS — R0789 Other chest pain: Secondary | ICD-10-CM | POA: Insufficient documentation

## 2016-02-26 NOTE — Assessment & Plan Note (Signed)
This was more concerning to patient. We gave B12 IM today, after checking a level (pending at OV).  If B12 up and sx not better, then we'll ask about neuro f/u re: possible neuropathy.  D/w pt.  He agrees.  He doesn't have typical claudication.  The pain isn't limited to a specific muscle group or joint, so I think that neuropathic source is more likely, esp given the sx.  D/w pt.  >25 minutes spent in face to face time with patient, >50% spent in counselling or coordination of care.

## 2016-02-26 NOTE — Assessment & Plan Note (Signed)
I think this was very likely noncardiac and w/o sx in the meantime, we didn't change anything at this point.  He can call cards about routine f/u.  He agrees.

## 2016-02-27 NOTE — Telephone Encounter (Signed)
I saw the patient for Dr Roda ShuttersXu for EP consultation regarding implantable loop recorder placement for cryptogenic stroke protocol. Appears he was in the office with chest pain.  Will need to establish with general cardiology or see general cardiology APP for further assessment of chest pain.

## 2016-03-01 NOTE — Telephone Encounter (Signed)
This was sent to me while on vacation.  03/01/16 is first day back in office.  Patient will be called to day bu Melissa to schedule OV with PA to further discuss CP from ER visit per Dr Johney FrameAllred and Dr Para Marchuncan.

## 2016-03-02 ENCOUNTER — Telehealth: Payer: Self-pay | Admitting: Neurology

## 2016-03-02 NOTE — Telephone Encounter (Signed)
[facility] [address] [phone] called sts provider is requesting the pt be seen soon for neuropathic leg pain. An appt was scheduled for 03/04/16. Pt has a 3mth f/u 7/25 for stroke

## 2016-03-02 NOTE — Telephone Encounter (Signed)
APPT CANCEL FOR July 25,2017.Pt will be seen for stroke and neuropathic leg pain evaluation by Dr.Xu on Friday.

## 2016-03-02 NOTE — Telephone Encounter (Signed)
[facility]t for neuropathic leg pain. Rene KocherRegina stated this is a new problem for patient,and Dr. Para Marchuncan wanted the patient seen earlier than the 03/15/2016 original appt. Rn stated is also being seen for stroke.

## 2016-03-03 ENCOUNTER — Other Ambulatory Visit: Payer: Self-pay | Admitting: Family Medicine

## 2016-03-04 ENCOUNTER — Ambulatory Visit (INDEPENDENT_AMBULATORY_CARE_PROVIDER_SITE_OTHER): Payer: Medicare HMO | Admitting: Neurology

## 2016-03-04 ENCOUNTER — Encounter: Payer: Self-pay | Admitting: Neurology

## 2016-03-04 VITALS — BP 138/66 | HR 79 | Ht 69.0 in | Wt 155.6 lb

## 2016-03-04 DIAGNOSIS — G2581 Restless legs syndrome: Secondary | ICD-10-CM

## 2016-03-04 DIAGNOSIS — E785 Hyperlipidemia, unspecified: Secondary | ICD-10-CM | POA: Diagnosis not present

## 2016-03-04 DIAGNOSIS — D735 Infarction of spleen: Secondary | ICD-10-CM

## 2016-03-04 DIAGNOSIS — I6350 Cerebral infarction due to unspecified occlusion or stenosis of unspecified cerebral artery: Secondary | ICD-10-CM

## 2016-03-04 DIAGNOSIS — I10 Essential (primary) hypertension: Secondary | ICD-10-CM

## 2016-03-04 MED ORDER — ROPINIROLE HCL 1 MG PO TABS: 1.0000 mg | ORAL_TABLET | Freq: Every day | ORAL | Status: DC

## 2016-03-04 NOTE — Progress Notes (Signed)
STROKE NEUROLOGY FOLLOW UP NOTE  NAME: [name] DOB: August 02, 1926  REASON FOR VISIT: stroke follow up HISTORY FROM: pt and chart  Today we had the pleasure of seeing [name] in follow-up at our [facility]. Pt was accompanied by wife.   History Summary [name] is a 82 y.o. male with history of hypertension, hyperlipidemia, COPD, diverticulitis, and von Willebrand's disease was admitted on 01/28/15 for dizziness and gait instability. MRI showed acute left frontal MCA/ACA punctate infarcts, as well as chronic right CR and left cerebellum. MRA head and CTA neck showed diffuse intracranial athero and left VA occlusion. Stroke work up showed normal 2D echo, but LDL 176. He was started on dural antiplatelet and recommend outpt PCSK9 inhibitors as he is not tolerating statins.  He was discharged home with home health.  However, he was admitted again on 03/29/15 for dizziness, lightheadedness and gait instability, almost same as last time. MRI showed new acute infarct at right frontal cortex and subcortical WM. LDL down to 84. Stroke etiology still consistent with diffuse athero and ? Dehydration. He was continued on dural antiplatelet.   04/03/15 follow up - the patient has been doing well. No more stroke like symptoms since last discharge. He stated that he is compliant with medications and no missing does. No palpitation of heart. Had occasions that after bowel movement, had small amount of blood on tissue paper when wiping out buttock. Complaining of hard stool but denies dehydration. BP in clinic was high at 173/78.   06/08/15 follow up - pt has been doing well. No recurrent symptoms. He had 30 day cardiac event monitoring did not show afib. He is continued on ASA and plavix. He has been on pralucent for LDL control. His BP today 148/67.   12/07/15 follow up - pt has been doing well. Stated to have b/l knee pain and difficulty walking, requesting for disability parking decal. Told  him that his PCP can help him on this issue. On ASA and plavix, and also on praluent for HLD. On 10/27/15 he was admitted for abdominal pain, found to have splenic infarct. CT abd/pelvis with contrast showed a 29 mm in diameter abdominal aortic aneurysm, and extensive soft and calcified atherosclerotic plaque throughout the aorta. TEE done showed grade IV plaque in the descending thoracic aorta. His splenic infarct was considered to be related to atherosclerosis of aorta and its branches. Pt has seen Dr. Cyndie Chime for von Willebrand's disorder and considered mild type I if he has VMD. He was put back on DAPT until end of May. BP 125/66 today in clinic.   Interval History During the interval time, pt has been doing well without stroke symptoms. Loop recorder placed in May this year and so far no afib found. Has stopped ASA as scheduled and currently on plavix. Has followed with PCP and orthopedics for knee pain but was told no abnormalities found.   Of note, he seems restless during the visit, keep moving while sitting in chair. By detailed history asking, he stated that if he works in the yard, he has no problems and no knee pain or discomfort. Once he gets inside room and sitting in chair, he has to move his legs otherwise his knees bother him a lot. Moving seems get things better. At night, some days he is OK but other days he can not get into sleep because he has to move his legs. Also noticed that his CBC showed microcytic anemia.  REVIEW OF SYSTEMS: Full 14 system review of systems performed and notable only for those listed below and in HPI above, all others are negative:  Constitutional:  Weight loss Cardiovascular: chest pain Ear/Nose/Throat:  Hearing loss Skin:  Eyes:   Respiratory:   Gastroitestinal:  Genitourinary:  Hematology/Lymphatic:  Easy bruising, easy bleeding Endocrine:  Musculoskeletal:   Allergy/Immunology:   Neurological:  Dizziness Psychiatric:  Sleep: restless  leg  The following represents the patient's updated allergies and side effects list: Allergies  Allergen Reactions  . Atorvastatin Other (See Comments)    REACTION: muscles tense up   . Codeine Nausea And Vomiting  . Lisinopril Swelling and Other (See Comments)    Lips and face swelling  . Statins Other (See Comments)    Muscle tense up    The neurologically relevant items on the patient's problem list were reviewed on today's visit.  Neurologic Examination  A problem focused neurological exam (12 or more points of the single system neurologic examination, vital signs counts as 1 point, cranial nerves count for 8 points) was performed.  Blood pressure 138/66, pulse 79, height 5\' 9"  (1.753 m), weight 155 lb 9.6 oz (70.58 kg).  General - Well nourished, well developed, in no apparent distress.  Ophthalmologic - Sharp disc margins OU. Fundi not visualized due to .  Cardiovascular - Regular rate and rhythm with no murmur.  musculoskeletal - bilateral knee pain / discomfort, keep moving legs during visit.  Mental Status -  Level of arousal and orientation to time, place, and person were intact. Language including expression, naming, repetition, comprehension was assessed and found intact. Fund of Knowledge was assessed and was intact.  Cranial Nerves II - XII - II - Visual field intact OU. III, IV, VI - Extraocular movements intact. V - Facial sensation intact bilaterally. VII - Facial movement intact bilaterally. VIII - Hard of hearing & vestibular intact bilaterally. X - Palate elevates symmetrically. XI - Chin turning & shoulder shrug intact bilaterally. XII - Tongue protrusion intact.  Motor Strength - The patient's strength was normal in all extremities and pronator drift was absent.  Bulk was normal and fasciculations were absent.   Motor Tone - Muscle tone was assessed at the neck and appendages and was normal.  Reflexes - The patient's reflexes were 1+ in all  extremities and he had no pathological reflexes.  Sensory - Light touch, temperature/pinprick were assessed and were normal.    Coordination - The patient had normal movements in the hands and feet with no ataxia or dysmetria.  Tremor was absent.  Gait and Station - mild stooped posturing.  Data reviewed: I personally reviewed the images and agree with the radiology interpretations.  Dg Chest 2 View 01/29/2015 No active cardiopulmonary disease.   Mr Brain Wo Contrast 01/28/2015 1. Punctate nonhemorrhagic white matter infarct deep to the right post central gyrus. A second punctate adjacent white matter infarct may be present as well. 2. Moderate generalized white matter disease bilaterally suggesting chronic microvascular ischemia. 3. Remote lacunar infarcts within the right coronal radiata and left cerebellum.   Mr Maxine Glenn Head/brain Wo Cm 01/29/2015 LEFT posterior-inferior cerebellar artery predominately terminates in PICA, with occluded LEFT V4 segment. Moderate to severe stenosis of RIGHT P2, moderate stenosis proximal LEFT P3. Moderate stenosis LEFT M2 segment origin.   MRA 03/29/15 - 1. No acute arterial finding or change from 01/29/2015. 2. Chronic left V4 segment occlusion with patent early branching left PICA. 3. Chronic bilateral PCA distribution moderate to  advanced stenoses. 4. 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  MRI 03/29/15 New nonhemorrhagic tubular shaped area of restricted diffusion RIGHT frontal cortex and subcortical white matter, consistent with an acute perforating vessel infarct, RIGHT MCA territory. Normalization of the previous areas of acute infarctions demonstrated in June. Generalized severe atrophy and extensive small vessel disease. Moderately extensive LEFT middle ear and mastoid fluid is asymmetric and persistent. Correlate clinically for otomastoiditis.  Ct Angio Neck W/cm &/or Wo/cm 01/29/2015 Moderate narrowing origin of the left  vertebral artery. Narrowing left vertebral artery at the C1-2 level. Occluded left vertebral artery at the level the foramen magnum (V4). Right vertebral artery is dominant with mild irregularity without high-grade stenosis. Plaque carotid bifurcation bilaterally with less than 50% diameter stenosis. Mild to slightly moderate narrowing proximal left common carotid artery. Cervical spondylotic changes most notable C4-5 thru C6-7 level. Complete opacification inferior left mastoid air cells. No obstructing lesion of the eustachian tube is noted. Fluid filled upper cervical esophagus. Right hilar adenopathy.   2D echo  - Left ventricle: The cavity size was normal. Wall thickness wasincreased in a pattern of mild LVH. The estimated ejectionfraction was 55%. Wall motion was normal; there were no regionalwall motion abnormalities. - Right ventricle: The cavity size was mildly dilated. Systolicfunction was mildly reduced. - Pulmonary arteries: PA peak pressure: 37 mm Hg (S). Impressions: No cardiac source of embolism was identified, butcannot be ruled out on the basis of this examination.  30 day cardiac monitoring - no afib  TEE 10/28/15  - Left ventricle: The cavity size was normal. Wall thickness was  normal. The estimated ejection fraction was 55%. Wall motion was  normal; there were no regional wall motion abnormalities. - Aortic valve: There was no stenosis. - Aorta: There was grade IV plaque in the descending thoracic  aorta. - Mitral valve: There was trivial regurgitation. - Left atrium: No evidence of thrombus in the atrial cavity or  appendage. - Right ventricle: The cavity size was normal. Systolic function  was normal. - Atrial septum: No defect or patent foramen ovale was identified.  Echo contrast study showed no right-to-left atrial level shunt,  at baseline or with provocation. Impressions: - Only possible source of embolus noted for splenic infarct would  be  extensive descending thoracic aorta plaque.  CT abd/pel with contrast  08/14/15 1. Fluid throughout the colon compatible with diarrhea, but otherwise no acute or inflammatory process in the abdomen or pelvis. 2. Extensive soft and calcified atherosclerosis in the aorta, its branches, and the pelvic arteries. No major arterial occlusion identified. Stable mild infrarenal abdominal aortic aneurysm, 29 mm. Recommend followup by Ultrasound in 5 years.  10/27/15 Splenic infarct. Small left inguinal hernia containing fat. Scattered diverticula in the colon without evidence of diverticulitis. Moderate-sized esophageal hiatal hernia.  Component     Latest Ref Rng 03/30/2015 04/20/2015 06/12/2015 08/19/2015  Cholesterol     0 - 200 mg/dL 161 096 045 409  Triglycerides     0.0 - 149.0 mg/dL 90 811.9 147.8 295.6 (H)  HDL Cholesterol     >39.00 mg/dL 37 (L) 21.30 86.57 84.69  VLDL     0.0 - 40.0 mg/dL 18 62.9 52.8 41.3  LDL (calc)     0 - 99 mg/dL 84 74 83 91  Total CHOL/HDL Ratio      3.8 3 3 3   NonHDL       94.85 106.09 122.09  PTT Lupus Anticoagulant     0.0 -  43.6 sec      DRVVT     0.0 - 44.0 sec      Lupus Anticoag Interp           Beta-2 Glycoprotein I Ab, IgG     0 - 20 GPI IgG units      Beta-2-Glycoprotein I IgM     0 - 32 GPI IgM units      Beta-2-Glycoprotein I IgA     0 - 25 GPI IgA units      Anticardiolipin Ab,IgG,Qn     0 - 14 GPL U/mL      Anticardiolipin Ab,IgM,Qn     0 - 12 MPL U/mL      Anticardiolipin Ab,IgA,Qn     0 - 11 APL U/mL      Coagulation Factor VIII     57 - 163 %      Ristocetin Co-factor, Plasma     50 - 200 %      Von Willebrand Antigen, Plasma     50 - 200 %      Hemoglobin A1C     4.8 - 5.6 % 6.0 (H)     Mean Plasma Glucose      126     Recommendations-F5LEID:           Comment           Recommendations-PTGENE:           Additional Information           Interpretation           PDF Image           Antithrombin Activity     75  - 120 %      Protein C-Functional     73 - 180 %      Protein C, Total     60 - 150 %      Protein S-Functional     63 - 140 %      Protein S, Total     60 - 150 %      Homocysteine     0.0 - 15.0 umol/L       Component     Latest Ref Rng 10/27/2015  Cholesterol     0 - 200 mg/dL   Triglycerides     0.0 - 149.0 mg/dL   HDL Cholesterol     >16.10 mg/dL   VLDL     0.0 - 96.0 mg/dL   LDL (calc)     0 - 99 mg/dL   Total CHOL/HDL Ratio        NonHDL        PTT Lupus Anticoagulant     0.0 - 43.6 sec 39.4  DRVVT     0.0 - 44.0 sec 43.8  Lupus Anticoag Interp      Comment:  Beta-2 Glycoprotein I Ab, IgG     0 - 20 GPI IgG units <9  Beta-2-Glycoprotein I IgM     0 - 32 GPI IgM units <9  Beta-2-Glycoprotein I IgA     0 - 25 GPI IgA units <9  Anticardiolipin Ab,IgG,Qn     0 - 14 GPL U/mL <9  Anticardiolipin Ab,IgM,Qn     0 - 12 MPL U/mL <9  Anticardiolipin Ab,IgA,Qn     0 - 11 APL U/mL <9  Coagulation Factor VIII     57 - 163 % 273 (H)  Ristocetin Co-factor, Plasma  50 - 200 % 145  Von Willebrand Antigen, Plasma     50 - 200 % 172  Hemoglobin A1C     4.8 - 5.6 %   Mean Plasma Glucose        Recommendations-F5LEID:      Comment (A)  Comment      Comment  Recommendations-PTGENE:      Comment  Additional Information      Comment  Interpretation      Note  PDF Image      .  Antithrombin Activity     75 - 120 % 137 (H)  Protein C-Functional     73 - 180 % 127  Protein C, Total     60 - 150 % 89  Protein S-Functional     63 - 140 % 84  Protein S, Total     60 - 150 % 121  Homocysteine     0.0 - 15.0 umol/L 17.1 (H)   Component     Latest Ref Rng 01/27/2016 02/25/2016  TSH     0.35 - 4.50 uIU/mL 2.59   Vitamin B12     211 - 911 pg/mL 193 (L) 470  Sed Rate     0 - 20 mm/hr 38 (H)     Assessment: As you may recall, he is a 82 y.o. Caucasian male with PMH of hypertension, hyperlipidemia, COPD, diverticulitis, and von Willebrand's disease was admitted  on 01/28/15 for dizziness and gait instability. MRI showed acute left frontal MCA/ACA punctate infarcts, as well as chronic right CR and left cerebellum. MRA head and CTA neck showed diffuse intracranial athero and left VA occlusion. Stroke work up showed normal 2D echo, but LDL 176. He was started on dural antiplatelet and recommend outpt PCSK9 inhibitors as he is not tolerating statins.  He was discharged home with home health. However, he was admitted again on 03/29/15 for similar symptoms. MRI showed new acute infarct at right frontal cortex and subcortical WM. LDL down to 84. He was continued on dural antiplatelet and started on praluent. 30 day cardiac event monitoring negative. After finished DAPT course, he was continued on plavix. 10/2015 he was admitted for splenic infarct, CT abd/pelvis with contrast showed a 29 mm AAA, and extensive soft and calcified atherosclerotic plaque throughout the aorta. TEE done showed grade IV plaque in the descending thoracic aorta. His splenic infarct was considered to be related to atherosclerosis of aorta and its branches. Pt has seen Dr. Cyndie ChimeGranfortuna for von Willebrand's disorder and considered possible mild type I. He was put back on DAPT until end of May when his ASA was stopped. He is now on plavix alone. Due to the recurrent strokes and splenic infarcts, loop recorder was placed and so far no afib.   However, found evidence of restless leg syndrome with knee discomfort at rest but not with moving, not able sleep due to leg moving at night, microcytic anemia. Will check iron panel and start requip trial at night first. Found to have B12 low on B12 supplement.   Plan:  - continue plavix and priluent for stroke prevention - continue loop recorder monitoring - follow up with Dr. Cyndie ChimeGranfortuna and cardiology for regular follow up - check iron and ferritin levels, if low, need supplement of iorn - continue B12 tablets - prescribe requip 1mg  at night to help restless leg.  Consider increase dose if not effective. - Follow up with your primary care physician for stroke risk factor modification. Recommend  maintain blood pressure goal <130/80, diabetes with hemoglobin A1c goal below 7.0% and lipids with LDL cholesterol goal below 70 mg/dL.  - check BP at home and record - follow up in 2 months.  I spent more than 25 minutes of face to face time with the patient. Greater than 50% of time was spent in counseling and coordination of care. We discussed new diagnosis of restless leg syndrome and treatment options, and continued cardiac monitoring.    Orders Placed This Encounter  Procedures  . Ferritin  . Iron and TIBC    Meds ordered this encounter  Medications  . rOPINIRole (REQUIP) 1 MG tablet    Sig: Take 1 tablet (1 mg total) by mouth at bedtime.    Dispense:  30 tablet    Refill:  2    Patient Instructions  - continue plavix and priluent for stroke prevention - follow up with loop recorder - follow up with Dr. Cyndie Chime and cardiology for regular follow up - will check iron levels, if low, you need supplement of iorn - continue B12 tablets - prescribe requip at night to help restless leg - Follow up with your primary care physician for stroke risk factor modification. Recommend maintain blood pressure goal <130/80, diabetes with hemoglobin A1c goal below 7.0% and lipids with LDL cholesterol goal below 70 mg/dL.  - check BP at home and record - follow up in 2 months.    Marvel Plan, MD PhD [facility] [address] [place], [place] [address] [phone]

## 2016-03-04 NOTE — Patient Instructions (Signed)
-   continue plavix and priluent for stroke prevention - follow up with loop recorder - follow up with Dr. Cyndie ChimeGranfortuna and cardiology for regular follow up - will check iron levels, if low, you need supplement of iorn - continue B12 tablets - prescribe requip at night to help restless leg - Follow up with your primary care physician for stroke risk factor modification. Recommend maintain blood pressure goal <130/80, diabetes with hemoglobin A1c goal below 7.0% and lipids with LDL cholesterol goal below 70 mg/dL.  - check BP at home and record - follow up in 2 months.

## 2016-03-05 LAB — FERRITIN: Ferritin: 13 ng/mL — ABNORMAL LOW (ref 30–400)

## 2016-03-05 LAB — IRON AND TIBC
Iron Saturation: 3 % — CL (ref 15–55)
Iron: 17 ug/dL — ABNORMAL LOW (ref 38–169)
Total Iron Binding Capacity: 501 ug/dL — ABNORMAL HIGH (ref 250–450)
UIBC: 484 ug/dL — ABNORMAL HIGH (ref 111–343)

## 2016-03-07 ENCOUNTER — Other Ambulatory Visit: Payer: Self-pay | Admitting: Neurology

## 2016-03-07 DIAGNOSIS — E611 Iron deficiency: Secondary | ICD-10-CM | POA: Insufficient documentation

## 2016-03-07 MED ORDER — IRON 325 (65 FE) MG PO TABS: 325.0000 mg | ORAL_TABLET | Freq: Three times a day (TID) | ORAL | Status: DC

## 2016-03-07 NOTE — Progress Notes (Signed)
Cardiology Office Note:    Date:  03/08/2016   ID:  [name], DOB 1925/12/07, MRN [id number]  PCP:  [name], MD  Cardiologist:  Dr. Hillis Range   Electrophysiologist:  Dr. Hillis Range   Referring MD: Joaquim Nam, MD   Chief Complaint  Patient presents with  . Hospitalization Follow-up    visit to ED 02/22/16 with chest pain    History of Present Illness:    [name] is a 82 y.o. male with a hx of Cryptogenic stroke in 6/16 and splenic infarct status post ILR, HTN, HL, COPD, von Willebrand's disease. Patient was seen by Dr. Johney Frame 5/17 arrange implantation of Linq ILR.  Patient was seen in the emergency room 02/22/16 with chest pain. Chest x-ray was unremarkable aside from demonstrating atherosclerosis.  Cardiac markers were normal. ECG was unremarkable.  He returns for follow-up today. Here with his wife. He notes one episode of chest discomfort. This was left-sided. It is hard to qualify. It only lasted a few minutes. 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 However, most of the equipment he uses requires him being seated. He denies exertional chest discomfort. He does note dyspnea with moderate to extreme activities. He denies any significant change. Denies orthopnea, PND or edema. Denies syncope. Denies any bleeding issues. Denies coughing or wheezing.  Past Medical History  Diagnosis Date  . Barrett esophagus 09/2003  . GERD (gastroesophageal reflux disease)   . Hemorrhoids   . Diverticulosis   . Hyperlipidemia   . Hypertension   . PUD (peptic ulcer disease)   . Hearing loss   . GI AVM (gastrointestinal arteriovenous vascular malformation)   . History of prostatitis   . Hemorrhoids   . Macular degeneration, wet (HCC)     receiving intra-ocular injections ([facility])  . Back pain     improved with gabapentin as of 2015  . Stroke [facility])     Past Surgical History  Procedure Laterality Date  . Inguinal hernia repair  2001  . Laparoscopic appendectomy N/A 07/07/2014     Procedure: APPENDECTOMY LAPAROSCOPIC;  Surgeon: Manus Rudd, MD;  Location: [facility] OR;  Service: General;  Laterality: N/A;  . Tee without cardioversion N/A 10/28/2015    Procedure: TRANSESOPHAGEAL ECHOCARDIOGRAM (TEE);  Surgeon: Laurey Morale, MD;  Location: [facility] ENDOSCOPY;  Service: Cardiovascular;  Laterality: N/A;  . Ep implantable device N/A 12/24/2015    Procedure: Loop Recorder Insertion;  Surgeon: Hillis Range, MD;  Location: [facility] INVASIVE CV LAB;  Service: Cardiovascular;  Laterality: N/A;    Current Medications: Outpatient Prescriptions Prior to Visit  Medication Sig Dispense Refill  . Alirocumab (PRALUENT) 150 MG/ML SOSY Inject 150 mg into the skin every 14 (fourteen) days. 2 mL 5  . Bevacizumab (AVASTIN IV) Place 1 Applicatorful into both eyes See admin instructions. Right eye every 9 weeks Left eye every 6 weeks.    . cetirizine (ZYRTEC) 10 MG tablet Take 10 mg by mouth daily as needed for allergies.     [name] Kitchen clopidogrel (PLAVIX) 75 MG tablet Take 1 tablet (75 mg total) by mouth daily. 30 tablet 5  . diltiazem (CARDIZEM CD) 180 MG 24 hr capsule TAKE ONE CAPSULE BY MOUTH DAILY 30 capsule 5  . Ferrous Sulfate (IRON) 325 (65 Fe) MG TABS Take 325 mg by mouth 3 (three) times daily. 90 each 3  . fluticasone (FLONASE) 50 MCG/ACT nasal spray Place 2 sprays into both nostrils daily as needed for allergies. 16 g 5  .  gabapentin (NEURONTIN) 300 MG capsule TAKE ONE CAPSULE BY MOUTH TWICE A DAY 180 capsule 3  . gemfibrozil (LOPID) 600 MG tablet Take 1 tablet (600 mg total) by mouth 2 (two) times daily. 60 tablet 12  . hydrochlorothiazide (HYDRODIURIL) 25 MG tablet Take 1 tablet (25 mg total) by mouth daily. 30 tablet 12  . Hypromellose (ARTIFICIAL TEARS OP) Apply 1 drop to eye daily as needed (itchy / watery eyes).    [name] Kitchen loperamide (IMODIUM) 2 MG capsule Take 1 capsule (2 mg total) by mouth 3 (three) times daily as needed for diarrhea or loose stools. 10 capsule 0  . meclizine (ANTIVERT) 25 MG tablet  Take 0.5 tablets (12.5 mg total) by mouth 3 (three) times daily as needed for dizziness.    . Multiple Vitamins-Minerals (OCUVITE ADULT 50+ PO) Take 1 capsule by mouth daily.      . pantoprazole (PROTONIX) 40 MG tablet Take 40 mg by mouth daily.    . ranitidine (ZANTAC) 150 MG tablet Take 1 tablet (150 mg total) by mouth 2 (two) times daily. 30 tablet 11  . rOPINIRole (REQUIP) 1 MG tablet Take 1 tablet (1 mg total) by mouth at bedtime. 30 tablet 2  . tamsulosin (FLOMAX) 0.4 MG CAPS capsule Take 0.4 mg by mouth daily.     . vitamin B-12 (CYANOCOBALAMIN) 1000 MCG tablet Take 2 tablets (2,000 mcg total) by mouth daily.    [name] Kitchen acetaminophen (TYLENOL) 500 MG tablet Take 1,000 mg by mouth at bedtime. Reported on 03/08/2016    . sucralfate (CARAFATE) 1 g tablet TAKE 1 TABLET BY MOUTH BEFORE LAYING DOWN FOR NAP OR BEDTIME (Patient not taking: Reported on 03/08/2016) 30 tablet 5  . traMADol (ULTRAM) 50 MG tablet TAKE ONE TABLET BY MOUTH EVERY EIGHT HOURS AS NEEDED (Patient not taking: Reported on 03/08/2016) 30 tablet 0   No facility-administered medications prior to visit.      Allergies:   Atorvastatin; Codeine; Lisinopril; and Statins   Social History   Social History  . Marital Status: Married    Spouse Name: Eward Rutigliano  . Number of Children: N/A  . Years of Education: N/A   Occupational History  . Retired   .     Social History Main Topics  . Smoking status: Former Smoker -- 0.00 packs/day for 36 years    Types: Cigarettes  . Smokeless tobacco: Never Used     Comment: Quit in 01-Jan-1982  . Alcohol Use: No  . Drug Use: No  . Sexual Activity: Not Asked   Other Topics Concern  . None   Social History Narrative   6th grade. Married 01-02-1951 1 son, 1 dtr - died at 5 months. Work - Management consultant, Engineer, drilling, Catering manager.   Retired-fulltime. "[employer] ACP - yes DNR, yes short-term mechanical ventilarion, no heroic or futile measures in face of irreversible.     Family History:  The patient's  family history includes Arthritis in his sister; Bleeding Disorder in his son; Cancer in his brother and mother; Coronary artery disease in his father; Heart attack in his father; Hyperlipidemia in his sister; Hypertension in his mother; Pancreatic cancer in his mother; Prostate cancer in his brother. There is no history of Diabetes.   ROS:   Please see the history of present illness.    Review of Systems  HENT: Positive for hearing loss.   Respiratory: Positive for snoring.   Hematologic/Lymphatic: Bruises/bleeds easily.  Musculoskeletal: Positive for back pain, joint pain and myalgias.  Neurological:  Positive for dizziness.   All other systems reviewed and are negative.   Physical Exam:    VS:  BP 132/60 mmHg  Pulse 74  Ht 5\' 9"  (1.753 m)  Wt 156 lb 6.4 oz (70.943 kg)  BMI 23.09 kg/m2    Wt Readings from Last 3 Encounters:  03/08/16 156 lb 6.4 oz (70.943 kg)  03/04/16 155 lb 9.6 oz (70.58 kg)  02/25/16 154 lb 8 oz (70.081 kg)    Physical Exam  Constitutional: He is oriented to person, place, and time. He appears well-developed and well-nourished.  HENT:  Head: Normocephalic and atraumatic.  Neck: No JVD present.  Cardiovascular: Normal rate, regular rhythm and normal heart sounds.   No murmur heard. Pulses:      Dorsalis pedis pulses are 2+ on the right side, and 2+ on the left side.       Posterior tibial pulses are 2+ on the right side, and 2+ on the left side.  Pulmonary/Chest: Effort normal and breath sounds normal. He has no wheezes. He has no rales.  ILR site well healed without erythema  Abdominal: Soft. There is no tenderness.  Musculoskeletal: He exhibits no edema.  Neurological: He is alert and oriented to person, place, and time.  Skin: Skin is warm and dry.  Psychiatric: He has a normal mood and affect.    Studies/Labs Reviewed:   EKG:  EKG is  ordered today.  The ekg ordered today demonstrates NSR, HR 73, normal axis, QTC 431 ms, frequent PVCs  Recent  Labs: 01/27/2016: ALT 9; TSH 2.59 02/22/2016: BUN 13; Creatinine, Ser 1.17; Hemoglobin 9.5*; Platelets 321; Potassium 3.7; Sodium 136   Recent Lipid Panel    Component Value Date/Time   CHOL 177 08/19/2015 0836   TRIG 153.0* 08/19/2015 0836   HDL 55.10 08/19/2015 0836   CHOLHDL 3 08/19/2015 0836   VLDL 30.6 08/19/2015 0836   LDLCALC 91 08/19/2015 0836   LDLDIRECT 167.2 08/12/2013 1024    Additional studies/ records that were reviewed today include:   TEE 10/28/15 EF 55%, normal wall motion, grade 4 plaque descending thoracic aorta, trivial MR, normal RVSF, no right-to-left atrial shunt  Event monitor 8/16 Sinus rhythm Rare pacs and pvcs No sustained arrhythmias No afib  Echo 6/16 Mild LVH, EF 85%, normal wall motion, mildly reduced RVSF, PASP 37 mmHg   ASSESSMENT:    1. Other chest pain   2. PVC's (premature ventricular contractions)   3. Essential hypertension   4. History of CVA (cerebrovascular accident)    PLAN:    In order of problems listed above:  1. Chest pain - Symptoms are fairly atypical.  He has evidence of atherosclerosis on CXR.  Given his advanced age, he is at risk of obstructive CAD.  It is not clear if he can manage walking on a treadmill.  Therefore, I will arrange Lexiscan Myoview.  If abnormal, I will have him FU with me and Dr. Everette RankJay Varanasi (DOD today) for evaluation.  Otherwise, he can FU with Dr. Hillis RangeJames Allred as planned.  2. PVCs - EF has been normal in the past.  Will see what his EF is on Nuclear study.  He is asymptomatic. Check BMET, Mg2+.  3. HTN - BP controlled on current regimen.  4. Hx of CVA - FU with EP as planned for monitoring of ILR.    Medication Adjustments/Labs and Tests Ordered: Current medicines are reviewed at length with the patient today.  Concerns regarding medicines are outlined above.  Medication changes, Labs and Tests ordered today are outlined in the Patient Instructions noted below. Patient Instructions  Medication  Instructions:  No changes.  See your medication list. Labwork: Today - BMET, Magnesium  Testing/Procedures: Schedule a nuclear stress test (Lexiscan Myoview). Follow-Up: Dr. Hillis Range as needed/directed.  Any Other Special Instructions Will Be Listed Below (If Applicable). If you need a refill on your cardiac medications before your next appointment, please call your pharmacy.    Signed, Tereso Newcomer, PA-C  03/08/2016 9:37 AM    [facility] [address] [place], [place], [place]  [address] Phone: [phone]; Fax: [phone]

## 2016-03-08 ENCOUNTER — Ambulatory Visit (INDEPENDENT_AMBULATORY_CARE_PROVIDER_SITE_OTHER): Payer: Medicare HMO | Admitting: Physician Assistant

## 2016-03-08 ENCOUNTER — Telehealth: Payer: Self-pay | Admitting: *Deleted

## 2016-03-08 ENCOUNTER — Encounter: Payer: Self-pay | Admitting: Physician Assistant

## 2016-03-08 VITALS — BP 132/60 | HR 74 | Ht 69.0 in | Wt 156.4 lb

## 2016-03-08 DIAGNOSIS — I10 Essential (primary) hypertension: Secondary | ICD-10-CM | POA: Diagnosis not present

## 2016-03-08 DIAGNOSIS — Z8673 Personal history of transient ischemic attack (TIA), and cerebral infarction without residual deficits: Secondary | ICD-10-CM | POA: Diagnosis not present

## 2016-03-08 DIAGNOSIS — R0789 Other chest pain: Secondary | ICD-10-CM

## 2016-03-08 DIAGNOSIS — I493 Ventricular premature depolarization: Secondary | ICD-10-CM

## 2016-03-08 LAB — CUP PACEART REMOTE DEVICE CHECK: Date Time Interrogation Session: 20170703150709

## 2016-03-08 LAB — BASIC METABOLIC PANEL
BUN: 15 mg/dL (ref 7–25)
CO2: 26 mmol/L (ref 20–31)
Calcium: 8.9 mg/dL (ref 8.6–10.3)
Chloride: 101 mmol/L (ref 98–110)
Creat: 1.26 mg/dL — ABNORMAL HIGH (ref 0.70–1.11)
Glucose, Bld: 135 mg/dL — ABNORMAL HIGH (ref 65–99)
Potassium: 3.9 mmol/L (ref 3.5–5.3)
Sodium: 135 mmol/L (ref 135–146)

## 2016-03-08 LAB — MAGNESIUM: Magnesium: 2.1 mg/dL (ref 1.5–2.5)

## 2016-03-08 NOTE — Patient Instructions (Addendum)
Medication Instructions:  No changes.  See your medication list. Labwork: Today - BMET, Magnesium  Testing/Procedures: Schedule a nuclear stress test (Lexiscan Myoview). Follow-Up: Dr. Hillis RangeJames Allred as needed/directed.  Any Other Special Instructions Will Be Listed Below (If Applicable). If you need a refill on your cardiac medications before your next appointment, please call your pharmacy.

## 2016-03-08 NOTE — Telephone Encounter (Signed)
Called pt to go over lab results though no answer.

## 2016-03-09 NOTE — Telephone Encounter (Signed)
F/U   Pt wife calling to get lab result. Please call.

## 2016-03-09 NOTE — Telephone Encounter (Signed)
Follow Up Call,  Returning a call .

## 2016-03-10 ENCOUNTER — Other Ambulatory Visit: Payer: Self-pay | Admitting: Family Medicine

## 2016-03-10 ENCOUNTER — Telehealth: Payer: Self-pay | Admitting: Internal Medicine

## 2016-03-10 NOTE — Telephone Encounter (Signed)
New message    Pts wife is calling to get lab results. Please call.

## 2016-03-10 NOTE — Telephone Encounter (Signed)
Tried x 2 to reach pt to go over results. No answer and no vm or answering machine, phone just keeps ringing.

## 2016-03-10 NOTE — Telephone Encounter (Signed)
Electronic refill request. Last Filled:    30 tablet 5 08/21/2015  Please advise.

## 2016-03-10 NOTE — Telephone Encounter (Signed)
DPR for wife who has been notified of lab results for pt.

## 2016-03-10 NOTE — Telephone Encounter (Signed)
DPR for wife who has been notified of lab results for pt.  

## 2016-03-11 NOTE — Telephone Encounter (Signed)
Sent. Thanks.   

## 2016-03-15 ENCOUNTER — Ambulatory Visit: Payer: Medicare HMO | Admitting: Neurology

## 2016-03-22 ENCOUNTER — Other Ambulatory Visit: Payer: Self-pay | Admitting: Family Medicine

## 2016-03-22 NOTE — Telephone Encounter (Signed)
Received faxed refill request for traMADol (ULTRAM) 50 MG tablet. Take one tablet by mouth every 8 hours as needed.  Last prescribed by Jae Dire on 02/09/2016. Last seen on 02/25/2016.

## 2016-03-23 ENCOUNTER — Other Ambulatory Visit: Payer: Self-pay | Admitting: Family Medicine

## 2016-03-23 ENCOUNTER — Ambulatory Visit (INDEPENDENT_AMBULATORY_CARE_PROVIDER_SITE_OTHER): Payer: Medicare HMO | Admitting: *Deleted

## 2016-03-23 DIAGNOSIS — I63429 Cerebral infarction due to embolism of unspecified anterior cerebral artery: Secondary | ICD-10-CM | POA: Diagnosis not present

## 2016-03-23 MED ORDER — TRAMADOL HCL 50 MG PO TABS: 50.0000 mg | ORAL_TABLET | Freq: Three times a day (TID) | ORAL | 1 refills | Status: DC | PRN

## 2016-03-23 NOTE — Telephone Encounter (Signed)
Received refill request electronically Last office visit 02/25/16 Med list shows Protonix and Zantac Okay to refill Protonix as requested?

## 2016-03-23 NOTE — Telephone Encounter (Signed)
Rx called to pharmacy as instructed. 

## 2016-03-23 NOTE — Telephone Encounter (Signed)
Please call in.  Thanks.   

## 2016-03-23 NOTE — Progress Notes (Signed)
Carelink Summary Report / Loop Recorder 

## 2016-03-24 NOTE — Telephone Encounter (Signed)
Clarify with patient.  I thought he was off PPI and on zantac.  Let me know.  Thanks.

## 2016-03-24 NOTE — Telephone Encounter (Signed)
Tried to call pt. No answer and no VM 

## 2016-03-24 NOTE — Telephone Encounter (Signed)
Noted. Thanks.  Sent.  

## 2016-03-24 NOTE — Telephone Encounter (Signed)
Wife phoned in.  Patient is taking Protonix and Zantac.

## 2016-03-25 ENCOUNTER — Encounter (INDEPENDENT_AMBULATORY_CARE_PROVIDER_SITE_OTHER): Payer: Self-pay

## 2016-03-25 ENCOUNTER — Ambulatory Visit (HOSPITAL_COMMUNITY): Payer: Medicare HMO | Attending: Cardiology

## 2016-03-25 DIAGNOSIS — R0789 Other chest pain: Secondary | ICD-10-CM | POA: Insufficient documentation

## 2016-03-25 DIAGNOSIS — I10 Essential (primary) hypertension: Secondary | ICD-10-CM | POA: Insufficient documentation

## 2016-03-25 DIAGNOSIS — R002 Palpitations: Secondary | ICD-10-CM | POA: Insufficient documentation

## 2016-03-25 DIAGNOSIS — R9439 Abnormal result of other cardiovascular function study: Secondary | ICD-10-CM | POA: Diagnosis not present

## 2016-03-25 LAB — MYOCARDIAL PERFUSION IMAGING
Peak HR: 93 {beats}/min
RATE: 0.27
Rest HR: 71 {beats}/min
SDS: 6
SRS: 1
SSS: 7
TID: 0.79

## 2016-03-25 MED ORDER — TECHNETIUM TC 99M TETROFOSMIN IV KIT
10.3000 | PACK | Freq: Once | INTRAVENOUS | Status: AC | PRN
  Administered 2016-03-25: 10 via INTRAVENOUS
  Filled 2016-03-25: qty 10

## 2016-03-25 MED ORDER — TECHNETIUM TC 99M TETROFOSMIN IV KIT
30.0000 | PACK | Freq: Once | INTRAVENOUS | Status: AC | PRN
  Administered 2016-03-25: 30 via INTRAVENOUS
  Filled 2016-03-25: qty 30

## 2016-03-25 MED ORDER — REGADENOSON 0.4 MG/5ML IV SOLN
0.4000 mg | Freq: Once | INTRAVENOUS | Status: AC
  Administered 2016-03-25: 0.4 mg via INTRAVENOUS

## 2016-03-28 ENCOUNTER — Encounter: Payer: Self-pay | Admitting: Physician Assistant

## 2016-03-29 ENCOUNTER — Ambulatory Visit (INDEPENDENT_AMBULATORY_CARE_PROVIDER_SITE_OTHER): Payer: Medicare HMO | Admitting: Family Medicine

## 2016-03-29 ENCOUNTER — Telehealth: Payer: Self-pay | Admitting: *Deleted

## 2016-03-29 ENCOUNTER — Ambulatory Visit (INDEPENDENT_AMBULATORY_CARE_PROVIDER_SITE_OTHER)
Admission: RE | Admit: 2016-03-29 | Discharge: 2016-03-29 | Disposition: A | Discharge status: Home / Self Care | Payer: Medicare HMO | Source: Ambulatory Visit | Attending: Family Medicine | Admitting: Family Medicine

## 2016-03-29 ENCOUNTER — Encounter: Payer: Self-pay | Admitting: Family Medicine

## 2016-03-29 VITALS — BP 122/60 | HR 74 | Temp 97.6°F | Wt 156.8 lb

## 2016-03-29 DIAGNOSIS — M545 Low back pain, unspecified: Secondary | ICD-10-CM

## 2016-03-29 DIAGNOSIS — M25512 Pain in left shoulder: Secondary | ICD-10-CM | POA: Diagnosis not present

## 2016-03-29 DIAGNOSIS — M7710 Lateral epicondylitis, unspecified elbow: Secondary | ICD-10-CM

## 2016-03-29 MED ORDER — NITROGLYCERIN 0.4 MG SL SUBL: 0.4000 mg | SUBLINGUAL_TABLET | SUBLINGUAL | 99 refills | Status: DC | PRN

## 2016-03-29 NOTE — Telephone Encounter (Signed)
I tried to reach pt to go over myoview results and findings, though no answer. I will try to reach pt later to discuss results and recommendations.

## 2016-03-29 NOTE — Patient Instructions (Signed)
Go to the lab on the way out.  We'll contact you with your xray report. Get a tennis elbow strap in the meantime.  Use that on your upper forearm.  That should help with the elbow pain.  Use the shoulder exercises in the meantime.  Call the cardiology clinic.  Tell them I already sent your nitroglycerin to the pharmacy.  Take care.  Glad to see you.

## 2016-03-29 NOTE — Progress Notes (Signed)
L subconjunctival hemorrhage after injection this AM.    L shoulder pain.  Started hurting about 3 weeks ago.  Pain laying on L side.  Pain with ROM, esp abduction and movement above the head.  No R shoulder pain.  Grip still wnl.  No pain with elbow ROM.    Back pain.  Lower back, lower L spine in midline.  Longstanding but usually doesn't last as long as current flare.  No recent falls.  No L sided pain.  No B/B sx.  Has tramadol for prn use, he tries to limit use.  Usually about 1 dose per day, rarely BID dosing.  Some day with no tramadol use . It clearly helps.    His legs are clearly better and I am grateful for neuro input.    Cards testing results briefly d/w pt, rx done for ntg at OV.  No chest pain.  Advised patient to call cards re: specific instructions.    Meds, vitals, and allergies reviewed.   ROS: Per HPI unless specifically indicated in ROS section   GEN: nad, alert and oriented HEENT: mucous membranes moist NECK: supple w/o LA CV: rrr PULM: ctab, no inc wob ABD: soft, +bs EXT: no edema SKIN: no acute rash L shoulder with pain with overhead movement.  Pain with int rotation.  No arm drop.  L elbow with pain on resisted pronation and supination, improved with compression of the proximal forearm.  Distally nv intact Back ttp in lower L spine in midline.  No bruising or rash.   Gait wnl, normal weight bearing.

## 2016-03-29 NOTE — Progress Notes (Signed)
Pre visit review using our clinic review tool, if applicable. No additional management support is needed unless otherwise documented below in the visit note. 

## 2016-03-30 ENCOUNTER — Other Ambulatory Visit: Payer: Self-pay | Admitting: Family Medicine

## 2016-03-30 DIAGNOSIS — M545 Low back pain, unspecified: Secondary | ICD-10-CM | POA: Insufficient documentation

## 2016-03-30 DIAGNOSIS — M7710 Lateral epicondylitis, unspecified elbow: Secondary | ICD-10-CM | POA: Insufficient documentation

## 2016-03-30 DIAGNOSIS — M2550 Pain in unspecified joint: Secondary | ICD-10-CM | POA: Insufficient documentation

## 2016-03-30 DIAGNOSIS — M25519 Pain in unspecified shoulder: Secondary | ICD-10-CM | POA: Insufficient documentation

## 2016-03-30 DIAGNOSIS — R911 Solitary pulmonary nodule: Secondary | ICD-10-CM

## 2016-03-30 NOTE — Assessment & Plan Note (Signed)
Plain films today, continue tramadol prn.  See imaging notes.  No red flag sx, no weakness.  >25 minutes spent in face to face time with patient, >50% spent in counselling or coordination of care.

## 2016-03-30 NOTE — Assessment & Plan Note (Addendum)
Likely cuff sx, no sign of full tear on exam, d/w pt.  Use home exercises and tramadol as needed.   See note on imaging.

## 2016-03-30 NOTE — Assessment & Plan Note (Signed)
Use a tennis elbow strap, anatomy d/w pt.  Fu prn.  He agrees.

## 2016-04-01 ENCOUNTER — Telehealth: Payer: Self-pay | Admitting: Family Medicine

## 2016-04-01 NOTE — Telephone Encounter (Signed)
Opened in error

## 2016-04-04 LAB — CUP PACEART REMOTE DEVICE CHECK: Date Time Interrogation Session: 20170802150837

## 2016-04-06 ENCOUNTER — Ambulatory Visit (INDEPENDENT_AMBULATORY_CARE_PROVIDER_SITE_OTHER)
Admission: RE | Admit: 2016-04-06 | Discharge: 2016-04-06 | Disposition: A | Discharge status: Home / Self Care | Payer: Medicare HMO | Source: Ambulatory Visit | Attending: Family Medicine | Admitting: Family Medicine

## 2016-04-06 DIAGNOSIS — R911 Solitary pulmonary nodule: Secondary | ICD-10-CM

## 2016-04-08 ENCOUNTER — Encounter: Payer: Self-pay | Admitting: Physician Assistant

## 2016-04-10 NOTE — Progress Notes (Signed)
Cardiology Office Note:    Date:  04/11/2016   ID:  Martin IvanJohn P Buenaventura, DOB April 03, 1926, MRN [id number]  PCP:  Crawford GivensGraham Duncan, MD  Cardiologist:  Dr. Hillis RangeJames Allred >> will arrange FU with Dr. Everette RankJay Varanasi    Electrophysiologist:  Dr. Hillis RangeJames Allred   Referring MD: Joaquim Namuncan, Graham S, MD   Chief Complaint  Patient presents with  . Follow-up    Chest pain, abnormal Myoview    History of Present Illness:    [name] is a 82 y.o. male with a hx of Cryptogenic stroke in 6/16 and splenic infarct status post ILR, HTN, HL, COPD, von Willebrand's disease. Patient was seen by Dr. Johney FrameAllred 5/17 arrange implantation of Linq ILR.  Patient was seen in the emergency room 02/22/16 with chest pain. Chest x-ray was unremarkable aside from demonstrating atherosclerosis.  Cardiac markers were normal. ECG was unremarkable.  I saw him in follow-up 03/08/16. He noted one episode of chest discomfort that was left-sided and only lasted a few minutes.  I reviewed his case with Dr. Eldridge DaceVaranasi. I set him up for a nuclear stress test which demonstrated inferior ischemia. This was an intermediate risk study. I asked Dr. Eldridge DaceVaranasi to review this. He recommended medical therapy unless the patient is symptomatic.   He returns for follow-up.  Here with his wife. He notes occasional left shoulder discomfort. This is all positional. He denies recurrent chest symptoms. He denies significant dyspnea. He remains fairly active. He denies orthopnea, PND or edema. He denies syncope.  Prior CV studies that were reviewed today include:    Chest CT 04/06/16 IMPRESSION: 1. The nodule questioned on left shoulder films represents a small calcified granuloma within the superior segment of the left lower lobe. There also are small calcified splenic granulomas consistent with prior granulomatous disease. 2. Moderate thoracic aortic atherosclerosis. 3. Calcification of the coronary arteries diffusely. 4. Paraseptal emphysema.  Myoview  03/25/16 Medium size, moderate intensity (SDS 5) reversible inferior perfusion defect which could suggest ischemia. Study was not gated due to frequent ventricular ectopy. This is an intermediate risk study.  TEE 10/28/15 EF 55%, normal wall motion, grade 4 plaque descending thoracic aorta, trivial MR, normal RVSF, no right-to-left atrial shunt  Event monitor 8/16 Sinus rhythm Rare pacs and pvcs No sustained arrhythmias No afib  Echo 6/16 Mild LVH, EF 85%, normal wall motion, mildly reduced RVSF, PASP 37 mmHg  Past Medical History:  Diagnosis Date  . Back pain    improved with gabapentin as of 2015  . Barrett esophagus 09/2003  . Diverticulosis   . GERD (gastroesophageal reflux disease)   . GI AVM (gastrointestinal arteriovenous vascular malformation)   . Hearing loss   . Hemorrhoids   . Hemorrhoids   . History of nuclear stress test    a. Myoview 8/17: Intermediate risk, inferior ischemia, not gated  . History of prostatitis   . Hyperlipidemia   . Hypertension   . Macular degeneration, wet (HCC)    receiving intra-ocular injections ([place])  . PUD (peptic ulcer disease)   . Stroke [facility](HCC)     Past Surgical History:  Procedure Laterality Date  . EP IMPLANTABLE DEVICE N/A 12/24/2015   Procedure: Loop Recorder Insertion;  Surgeon: Hillis RangeJames Allred, MD;  Location: [facility] INVASIVE CV LAB;  Service: Cardiovascular;  Laterality: N/A;  . INGUINAL HERNIA REPAIR  2001  . LAPAROSCOPIC APPENDECTOMY N/A 07/07/2014   Procedure: APPENDECTOMY LAPAROSCOPIC;  Surgeon: Manus RuddMatthew Tsuei, MD;  Location: [facility] OR;  Service: General;  Laterality:  N/A;  . TEE WITHOUT CARDIOVERSION N/A 10/28/2015   Procedure: TRANSESOPHAGEAL ECHOCARDIOGRAM (TEE);  Surgeon: Laurey Morale, MD;  Location: [facility] ENDOSCOPY;  Service: Cardiovascular;  Laterality: N/A;    Current Medications: Outpatient Medications Prior to Visit  Medication Sig Dispense Refill  . Alirocumab (PRALUENT) 150 MG/ML SOSY Inject 150 mg into the skin every 14  (fourteen) days. 2 mL 5  . Bevacizumab (AVASTIN IV) Place 1 Applicatorful into both eyes See admin instructions. Right eye every 9 weeks Left eye every 6 weeks.    . cetirizine (ZYRTEC) 10 MG tablet Take 10 mg by mouth daily as needed for allergies.     [name] Kitchen clopidogrel (PLAVIX) 75 MG tablet TAKE 1 TABLET BY MOUTH DAILY 30 tablet 12  . diltiazem (CARDIZEM CD) 180 MG 24 hr capsule TAKE ONE CAPSULE BY MOUTH DAILY 30 capsule 5  . Ferrous Sulfate (IRON) 325 (65 Fe) MG TABS Take 325 mg by mouth 3 (three) times daily. 90 each 3  . fluticasone (FLONASE) 50 MCG/ACT nasal spray Place 2 sprays into both nostrils daily as needed for allergies. 16 g 5  . gabapentin (NEURONTIN) 300 MG capsule TAKE ONE CAPSULE BY MOUTH TWICE A DAY 180 capsule 3  . gemfibrozil (LOPID) 600 MG tablet Take 1 tablet (600 mg total) by mouth 2 (two) times daily. 60 tablet 12  . hydrochlorothiazide (HYDRODIURIL) 25 MG tablet Take 1 tablet (25 mg total) by mouth daily. 30 tablet 12  . Hypromellose (ARTIFICIAL TEARS OP) Apply 1 drop to eye daily as needed (itchy / watery eyes).    [name] Kitchen loperamide (IMODIUM) 2 MG capsule Take 1 capsule (2 mg total) by mouth 3 (three) times daily as needed for diarrhea or loose stools. 10 capsule 0  . meclizine (ANTIVERT) 25 MG tablet Take 0.5 tablets (12.5 mg total) by mouth 3 (three) times daily as needed for dizziness.    . Multiple Vitamins-Minerals (OCUVITE ADULT 50+ PO) Take 1 capsule by mouth daily.      . nitroGLYCERIN (NITROSTAT) 0.4 MG SL tablet Place 1 tablet (0.4 mg total) under the tongue every 5 (five) minutes as needed for chest pain (max 3 doses in 15 minutes). 25 tablet prn  . pantoprazole (PROTONIX) 40 MG tablet TAKE ONE TABLET BY MOUTH EACH MORNING 30 tablet 5  . ranitidine (ZANTAC) 150 MG tablet Take 1 tablet (150 mg total) by mouth 2 (two) times daily. 30 tablet 11  . rOPINIRole (REQUIP) 1 MG tablet Take 1 tablet (1 mg total) by mouth at bedtime. 30 tablet 2  . sucralfate (CARAFATE) 1 g  tablet TAKE 1 TABLET BY MOUTH BEFORE LAYING DOWN FOR [place] NEEDED    . tamsulosin (FLOMAX) 0.4 MG CAPS capsule Take 0.4 mg by mouth daily.     . traMADol (ULTRAM) 50 MG tablet Take 1 tablet (50 mg total) by mouth every 8 (eight) hours as needed for moderate pain or severe pain. 30 tablet 1  . vitamin B-12 (CYANOCOBALAMIN) 1000 MCG tablet Take 2 tablets (2,000 mcg total) by mouth daily.    . pantoprazole (PROTONIX) 40 MG tablet Take 40 mg by mouth daily.     No facility-administered medications prior to visit.       Allergies:   Atorvastatin; Codeine; Lisinopril; and Statins   Social History   Social History  . Marital status: Married    Spouse name: Izacc Demeyer  . Number of children: N/A  . Years of education: N/A   Occupational History  . Retired   .  Retired   Social History Main Topics  . Smoking status: Former Smoker    Packs/day: 0.00    Years: 36.00    Types: Cigarettes  . Smokeless tobacco: Never Used     Comment: Quit in 01/26/82  . Alcohol use No  . Drug use: No  . Sexual activity: Not Asked   Other Topics Concern  . None   Social History Narrative   6th grade. Married January 27, 1951 1 son, 1 dtr - died at 5 months. Work - Management consultant, Engineer, drilling, Catering manager.   Retired-fulltime. [employer] ACP - yes DNR, yes short-term mechanical ventilarion, no heroic or futile measures in face of irreversible.     Family History:  The patient's family history includes Arthritis in his sister; Bleeding Disorder in his son; Cancer in his brother and mother; Coronary artery disease in his father; Heart attack in his father; Hyperlipidemia in his sister; Hypertension in his mother; Pancreatic cancer in his mother; Prostate cancer in his brother.   ROS:   Please see the history of present illness.    Review of Systems  HENT: Positive for hearing loss.   Eyes: Positive for visual disturbance.  Cardiovascular: Positive for irregular heartbeat.  Respiratory: Positive for cough and  snoring.   Musculoskeletal: Positive for back pain.   All other systems reviewed and are negative.   EKGs/Labs/Other Test Reviewed:    EKG:  EKG is  ordered today.  The ekg ordered today demonstrates NSR, HR 83, PVCs, QTc 425 ms  Recent Labs: 01/27/2016: ALT 9; TSH 2.59 02/22/2016: Hemoglobin 9.5; Platelets 321 03/08/2016: BUN 15; Creat 1.26; Magnesium 2.1; Potassium 3.9; Sodium 135   Recent Lipid Panel    Component Value Date/Time   CHOL 177 08/19/2015 0836   TRIG 153.0 (H) 08/19/2015 0836   HDL 55.10 08/19/2015 0836   CHOLHDL 3 08/19/2015 0836   VLDL 30.6 08/19/2015 0836   LDLCALC 91 08/19/2015 0836   LDLDIRECT 167.2 08/12/2013 1024     Physical Exam:    VS:  BP (!) 130/48   Pulse 83   Ht 5\' 9"  (1.753 m)   Wt 156 lb 6.4 oz (70.9 kg)   BMI 23.10 kg/m     Wt Readings from Last 3 Encounters:  04/11/16 156 lb 6.4 oz (70.9 kg)  03/29/16 156 lb 12 oz (71.1 kg)  03/08/16 156 lb 6.4 oz (70.9 kg)     Physical Exam  Constitutional: He is oriented to person, place, and time. He appears well-developed and well-nourished. No distress.  HENT:  Head: Normocephalic and atraumatic.  Eyes: No scleral icterus.  Neck: Normal range of motion. No JVD present.  Cardiovascular: Normal rate, regular rhythm, S1 normal and S2 normal.  Exam reveals no gallop and no friction rub.   No murmur heard. Pulmonary/Chest: Effort normal and breath sounds normal. He has no wheezes. He has no rhonchi. He has no rales.  Abdominal: Soft. There is no tenderness.  Musculoskeletal: He exhibits no edema.  Neurological: He is alert and oriented to person, place, and time.  Skin: Skin is warm and dry.  Psychiatric: He has a normal mood and affect.    ASSESSMENT:    1. Coronary artery disease involving native coronary artery of native heart without angina pectoris   2. PVC's (premature ventricular contractions)   3. Essential hypertension   4. History of CVA (cerebrovascular accident)    PLAN:    In  order of problems listed above:  1. CAD - He has  history of recent chest CT that demonstrates coronary calcification. He has a recent Myoview with inferior ischemia. However, he denies chest symptoms. We had a long discussion today regarding his stress test findings. He currently does not describe any symptoms that would be consistent with angina. Given his advanced age, Dr. Eldridge DaceVaranasi and I have recommended proceeding with medical therapy unless he has symptoms. We discussed potential symptoms that could be attributed to coronary artery disease. He knows to return for sooner follow-up if he should develop symptoms. For now, I have recommended continuing his current medical regimen which includes Plavix, Alirocumab, diltiazem. I will see him back in 3 months.   2. PVCs - EF has been normal in the past.   Recent potassium and magnesium levels were normal. Recent nuclear stress test was not gated due to PVCs.I will arrange 24 hour Holter to assess PVC burden.   3. HTN - BP controlled on current regimen.  4. Hx of CVA - FU with EP as planned for monitoring of ILR.    Medication Adjustments/Labs and Tests Ordered: Current medicines are reviewed at length with the patient today.  Concerns regarding medicines are outlined above.  Medication changes, Labs and Tests ordered today are outlined in the Patient Instructions noted below. Patient Instructions  Medication Instructions:  Your physician recommends that you continue on your current medications as directed. Please refer to the Current Medication list given to you today. Labwork: NONE Testing/Procedures: Your physician has recommended that you wear a 24 HOUR holter monitor. Holter monitors are medical devices that record the heart's electrical activity. Doctors most often use these monitors to diagnose arrhythmias. Arrhythmias are problems with the speed or rhythm of the heartbeat. The monitor is a small, portable device. You can wear one while you  do your normal daily activities. This is usually used to diagnose what is causing palpitations/syncope (passing out). Follow-Up: 3 MONTHS WITH Clarabel Marion, [facility] Any Other Special Instructions Will Be Listed Below (If Applicable). CALL THE OFFICE FOR A SOONER APPT IF YOU ARE CONTINUING TO HAVE CHEST PAIN AND SHORTNESS OF BREATH If you need a refill on your cardiac medications before your next appointment, please call your pharmacy.   Signed, Tereso NewcomerScott Valencia [place], PA-C  04/11/2016 11:56 AM    [facility] [address], [place], [place]  [address] Phone: [phone]; Fax: [phone]

## 2016-04-11 ENCOUNTER — Ambulatory Visit (INDEPENDENT_AMBULATORY_CARE_PROVIDER_SITE_OTHER): Payer: Medicare HMO | Admitting: Physician Assistant

## 2016-04-11 ENCOUNTER — Encounter: Payer: Self-pay | Admitting: Physician Assistant

## 2016-04-11 VITALS — BP 130/48 | HR 83 | Ht 69.0 in | Wt 156.4 lb

## 2016-04-11 DIAGNOSIS — I10 Essential (primary) hypertension: Secondary | ICD-10-CM | POA: Diagnosis not present

## 2016-04-11 DIAGNOSIS — I493 Ventricular premature depolarization: Secondary | ICD-10-CM

## 2016-04-11 DIAGNOSIS — Z8673 Personal history of transient ischemic attack (TIA), and cerebral infarction without residual deficits: Secondary | ICD-10-CM

## 2016-04-11 DIAGNOSIS — I2510 Atherosclerotic heart disease of native coronary artery without angina pectoris: Secondary | ICD-10-CM | POA: Diagnosis not present

## 2016-04-11 NOTE — Patient Instructions (Addendum)
Medication Instructions:  Your physician recommends that you continue on your current medications as directed. Please refer to the Current Medication list given to you today. Labwork: NONE Testing/Procedures: Your physician has recommended that you wear a 24 HOUR holter monitor. Holter monitors are medical devices that record the heart's electrical activity. Doctors most often use these monitors to diagnose arrhythmias. Arrhythmias are problems with the speed or rhythm of the heartbeat. The monitor is a small, portable device. You can wear one while you do your normal daily activities. This is usually used to diagnose what is causing palpitations/syncope (passing out). Follow-Up: 3 MONTHS WITH SCOTT WEAVER, [facility] Any Other Special Instructions Will Be Listed Below (If Applicable). CALL THE OFFICE FOR A SOONER APPT IF YOU ARE CONTINUING TO HAVE CHEST PAIN AND SHORTNESS OF BREATH If you need a refill on your cardiac medications before your next appointment, please call your pharmacy.

## 2016-04-12 ENCOUNTER — Ambulatory Visit (INDEPENDENT_AMBULATORY_CARE_PROVIDER_SITE_OTHER): Payer: Medicare HMO

## 2016-04-12 DIAGNOSIS — I493 Ventricular premature depolarization: Secondary | ICD-10-CM

## 2016-04-22 ENCOUNTER — Ambulatory Visit (INDEPENDENT_AMBULATORY_CARE_PROVIDER_SITE_OTHER): Payer: Medicare HMO | Admitting: *Deleted

## 2016-04-22 DIAGNOSIS — I63429 Cerebral infarction due to embolism of unspecified anterior cerebral artery: Secondary | ICD-10-CM

## 2016-04-22 NOTE — Progress Notes (Signed)
Carelink Summary Report / Loop Recorder 

## 2016-04-26 ENCOUNTER — Encounter: Payer: Self-pay | Admitting: Family Medicine

## 2016-04-26 ENCOUNTER — Ambulatory Visit (INDEPENDENT_AMBULATORY_CARE_PROVIDER_SITE_OTHER): Payer: Medicare HMO | Admitting: Family Medicine

## 2016-04-26 VITALS — BP 126/64 | HR 79 | Temp 97.8°F | Wt 153.5 lb

## 2016-04-26 DIAGNOSIS — Z23 Encounter for immunization: Secondary | ICD-10-CM | POA: Diagnosis not present

## 2016-04-26 DIAGNOSIS — I10 Essential (primary) hypertension: Secondary | ICD-10-CM

## 2016-04-26 DIAGNOSIS — M545 Low back pain: Secondary | ICD-10-CM | POA: Diagnosis not present

## 2016-04-26 NOTE — Patient Instructions (Signed)
Likely episodic BP elevation from salt intake.   Update me as needed.  No med changes.  Avoid salt.  Take care.  Glad to see you.

## 2016-04-26 NOTE — Assessment & Plan Note (Signed)
Pain worse when up and moving, better at rest.  Using tramadol as needed for back pain, no ADE.  Not usually needing tramadol daily.  Continue prn use.

## 2016-04-26 NOTE — Progress Notes (Signed)
Some of his aches are better, but his back is still bothersome, worse when up and moving, better at rest.  Using tramadol as needed for back pain, no ADE.  Not usually needing tramadol daily.    BP changes.  Episodically elevated.  Had some variable salt in diet, more after eating [employer] fried chicken.  BP was okay at OV today.  No CP.  No BLE edema.  Using same BP cuff at home, some days to 170 systolic but some days at goal.  Usually doesn't have DBP elevation.   Weight has been variable 150-160s lbs usually.  Recently lower weight.  Appetite is still okay.  No FCNAV.  Recently had some diarrhea, last week, resolved now.  Some abd cramping with BM but not o/w.  No blood in stool.    Meds, vitals, and allergies reviewed.   ROS: Per HPI unless specifically indicated in ROS section   GEN: nad, alert and oriented HEENT: mucous membranes moist NECK: supple w/o LA CV: rrr with occ ectopy noted PULM: ctab, no inc wob ABD: soft, +bs EXT: no edema

## 2016-04-26 NOTE — Progress Notes (Signed)
Pre visit review using our clinic review tool, if applicable. No additional management support is needed unless otherwise documented below in the visit note. 

## 2016-04-26 NOTE — Assessment & Plan Note (Signed)
Likely with BP elevation related to salt intake, d/w pt.  He'll monitor.  BP is fine today at OV.  He agrees.  Okay for outpatient f/u.   We can monitor his weight.  He has had variable weights over the years and there doesn't appear to be anything ominous at this point.  D/w pt.  He agrees.  Flu shot today.

## 2016-04-27 ENCOUNTER — Telehealth: Payer: Self-pay

## 2016-04-27 NOTE — Telephone Encounter (Signed)
Tried to call patient, no answer at this time. 

## 2016-04-27 NOTE — Telephone Encounter (Signed)
-----   Message from Beatrice LecherScott T Weaver, [place]-C sent at 04/26/2016  5:32 PM EDT ----- Please tell patient his monitor shows NSR and PVCs. Number of PVCs is not too high. Continue with current treatment plan. Tereso NewcomerScott Weaver, PA-C   04/26/2016 5:33 PM     ----- Message ----- From: Corky CraftsJayadeep S Varanasi, MD Sent: 04/26/2016   5:26 PM To: Beatrice LecherScott T Weaver, PA-C, Joaquim NamGraham S Duncan, MD

## 2016-04-27 NOTE — Telephone Encounter (Signed)
Patient's wife (DPR) returned call. Informed her of monitor results. Patient's wife verbalized understanding.

## 2016-05-02 ENCOUNTER — Telehealth: Payer: Self-pay | Admitting: *Deleted

## 2016-05-02 NOTE — Telephone Encounter (Signed)
Pt and his wife have been notified of monitor results and findings by phone with verbal understanding. We will not start pt on a beta blocker since he has had fatigue on Metoprolol which was d/c by Dr. Johney FrameAllred.

## 2016-05-09 ENCOUNTER — Other Ambulatory Visit: Payer: Medicare HMO

## 2016-05-10 ENCOUNTER — Encounter: Payer: Self-pay | Admitting: Neurology

## 2016-05-10 ENCOUNTER — Ambulatory Visit (INDEPENDENT_AMBULATORY_CARE_PROVIDER_SITE_OTHER): Payer: Medicare HMO | Admitting: Neurology

## 2016-05-10 VITALS — BP 140/67 | HR 73 | Ht 69.0 in | Wt 156.0 lb

## 2016-05-10 DIAGNOSIS — E611 Iron deficiency: Secondary | ICD-10-CM

## 2016-05-10 DIAGNOSIS — G2581 Restless legs syndrome: Secondary | ICD-10-CM

## 2016-05-10 DIAGNOSIS — I10 Essential (primary) hypertension: Secondary | ICD-10-CM

## 2016-05-10 DIAGNOSIS — I6350 Cerebral infarction due to unspecified occlusion or stenosis of unspecified cerebral artery: Secondary | ICD-10-CM

## 2016-05-10 DIAGNOSIS — E785 Hyperlipidemia, unspecified: Secondary | ICD-10-CM | POA: Diagnosis not present

## 2016-05-10 NOTE — Patient Instructions (Addendum)
-   continue plavix and priluent for stroke prevention - continue loop recorder monitoring - follow up with Dr. Cyndie ChimeGranfortuna and cardiology - continue requip for restless leg syndrome. - check cholesterol and iron level next time when you see Dr. Para Marchuncan.  - Follow up with your primary care physician for stroke risk factor modification. Recommend maintain blood pressure goal <130/80, diabetes with hemoglobin A1c goal below 7.0% and lipids with LDL cholesterol goal below 70 mg/dL.  - check BP at home and record - follow up in 6 months.

## 2016-05-10 NOTE — Progress Notes (Signed)
STROKE NEUROLOGY FOLLOW UP NOTE  NAME: NADIA TORR DOB: 12/17/1925  REASON FOR VISIT: stroke follow up HISTORY FROM: pt and chart  Today we had the pleasure of seeing [name] in follow-up at our [facility]. Pt was accompanied by wife.   History Summary Mr. FERRIS FIELDEN is a 82 y.o. male with history of hypertension, hyperlipidemia, COPD, diverticulitis, and von Willebrand's disease was admitted on 01/28/15 for dizziness and gait instability. MRI showed acute left frontal MCA/ACA punctate infarcts, as well as chronic right CR and left cerebellum. MRA head and CTA neck showed diffuse intracranial athero and left VA occlusion. Stroke work up showed normal 2D echo, but LDL 176. He was started on dural antiplatelet and recommend outpt PCSK9 inhibitors as he is not tolerating statins.  He was discharged home with home health.  However, he was admitted again on 03/29/15 for dizziness, lightheadedness and gait instability, almost same as last time. MRI showed new acute infarct at right frontal cortex and subcortical WM. LDL down to 84. Stroke etiology still consistent with diffuse athero and ? Dehydration. He was continued on dural antiplatelet.   04/03/15 follow up - the patient has been doing well. No more stroke like symptoms since last discharge. He stated that he is compliant with medications and no missing does. No palpitation of heart. Had occasions that after bowel movement, had small amount of blood on tissue paper when wiping out buttock. Complaining of hard stool but denies dehydration. BP in clinic was high at 173/78.   06/08/15 follow up - pt has been doing well. No recurrent symptoms. He had 30 day cardiac event monitoring did not show afib. He is continued on ASA and plavix. He has been on pralucent for LDL control. His BP today 148/67.   12/07/15 follow up - pt has been doing well. Stated to have b/l knee pain and difficulty walking, requesting for disability parking decal. Told  him that his PCP can help him on this issue. On ASA and plavix, and also on praluent for HLD. On 10/27/15 he was admitted for abdominal pain, found to have splenic infarct. CT abd/pelvis with contrast showed a 29 mm in diameter abdominal aortic aneurysm, and extensive soft and calcified atherosclerotic plaque throughout the aorta. TEE done showed grade IV plaque in the descending thoracic aorta. His splenic infarct was considered to be related to atherosclerosis of aorta and its branches. Pt has seen Dr. Cyndie Chime for von Willebrand's disorder and considered mild type I if he has VMD. He was put back on DAPT until end of May. BP 125/66 today in clinic.   03/04/16 follow up - pt has been doing well without stroke symptoms. Loop recorder placed in May this year and so far no afib found. Has stopped ASA as scheduled and currently on plavix. Has followed with PCP and orthopedics for knee pain but was told no abnormalities found.  Of note, he seems restless during the visit, keep moving while sitting in chair. By detailed history asking, he stated that if he works in the yard, he has no problems and no knee pain or discomfort. Once he gets inside room and sitting in chair, he has to move his legs otherwise his knees bother him a lot. Moving seems get things better. At night, some days he is OK but other days he can not get into sleep because he has to move his legs. Also noticed that his CBC showed microcytic anemia.  Interval History  During the interval time, pt has been doing well. Found to have low iron and ferritin levels which are consistent with his RLS. He was put on iron supplement. He stated that after taking requip and iron pills, his symptoms of leg movement at night much improved. So far he complains of back pain and shoulder pain but they are likely due to arthritis. He also followed with cardiology for PVCs and CAD, continued on plavix and praluent and cardizem. His BP at home was high at 160-170, but  today in clinic 140/67.   REVIEW OF SYSTEMS: Full 14 system review of systems performed and notable only for those listed below and in HPI above, all others are negative:  Constitutional:   Cardiovascular:  Ear/Nose/Throat:  Skin:  Eyes:   Respiratory:   Gastroitestinal:  Genitourinary:  Hematology/Lymphatic:   Endocrine:  Musculoskeletal:  Joint pain, back pain Allergy/Immunology:   Neurological:   Psychiatric:  Sleep:   The following represents the patient's updated allergies and side effects list: Allergies  Allergen Reactions  . Atorvastatin Other (See Comments)    REACTION: muscles tense up   . Codeine Nausea And Vomiting  . Lisinopril Swelling and Other (See Comments)    Lips and face swelling  . Statins Other (See Comments)    Muscle tense up    The neurologically relevant items on the patient's problem list were reviewed on today's visit.  Neurologic Examination  A problem focused neurological exam (12 or more points of the single system neurologic examination, vital signs counts as 1 point, cranial nerves count for 8 points) was performed.  Blood pressure 140/67, pulse 73, height 5\' 9"  (1.753 m), weight 156 lb (70.8 kg).  General - Well nourished, well developed, in no apparent distress.  Ophthalmologic - Sharp disc margins OU. Fundi not visualized due to .  Cardiovascular - Regular rate and rhythm with no murmur.  Mental Status -  Level of arousal and orientation to time, place, and person were intact. Language including expression, naming, repetition, comprehension was assessed and found intact. Fund of Knowledge was assessed and was intact.  Cranial Nerves II - XII - II - Visual field intact OU. III, IV, VI - Extraocular movements intact. V - Facial sensation intact bilaterally. VII - Facial movement intact bilaterally. VIII - Hard of hearing & vestibular intact bilaterally. X - Palate elevates symmetrically. XI - Chin turning & shoulder shrug  intact bilaterally. XII - Tongue protrusion intact.  Motor Strength - The patient's strength was normal in all extremities and pronator drift was absent.  Bulk was normal and fasciculations were absent.   Motor Tone - Muscle tone was assessed at the neck and appendages and was normal.  Reflexes - The patient's reflexes were 1+ in all extremities and he had no pathological reflexes.  Sensory - Light touch, temperature/pinprick were assessed and were normal.    Coordination - The patient had normal movements in the hands and feet with no ataxia or dysmetria.  Tremor was absent.  Gait and Station - mild stooped posturing.  Data reviewed: I personally reviewed the images and agree with the radiology interpretations.  Dg Chest 2 View 01/29/2015 No active cardiopulmonary disease.   Mr Brain Wo Contrast 01/28/2015 1. Punctate nonhemorrhagic white matter infarct deep to the right post central gyrus. A second punctate adjacent white matter infarct may be present as well. 2. Moderate generalized white matter disease bilaterally suggesting chronic microvascular ischemia. 3. Remote lacunar infarcts within the right coronal radiata  and left cerebellum.   Mr Maxine Glenn Head/brain Wo Cm 01/29/2015 LEFT posterior-inferior cerebellar artery predominately terminates in PICA, with occluded LEFT V4 segment. Moderate to severe stenosis of RIGHT P2, moderate stenosis proximal LEFT P3. Moderate stenosis LEFT M2 segment origin.   MRA 03/29/15 - 1. No acute arterial finding or change from 01/29/2015. 2. Chronic left V4 segment occlusion with patent early branching left PICA. 3. Chronic bilateral PCA distribution moderate to advanced stenoses. 4. 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  MRI 03/29/15 New nonhemorrhagic tubular shaped area of restricted diffusion RIGHT frontal cortex and subcortical white matter, consistent with an acute perforating vessel infarct, RIGHT MCA territory. Normalization of  the previous areas of acute infarctions demonstrated in June. Generalized severe atrophy and extensive small vessel disease. Moderately extensive LEFT middle ear and mastoid fluid is asymmetric and persistent. Correlate clinically for otomastoiditis.  Ct Angio Neck W/cm &/or Wo/cm 01/29/2015 Moderate narrowing origin of the left vertebral artery. Narrowing left vertebral artery at the C1-2 level. Occluded left vertebral artery at the level the foramen magnum (V4). Right vertebral artery is dominant with mild irregularity without high-grade stenosis. Plaque carotid bifurcation bilaterally with less than 50% diameter stenosis. Mild to slightly moderate narrowing proximal left common carotid artery. Cervical spondylotic changes most notable C4-5 thru C6-7 level. Complete opacification inferior left mastoid air cells. No obstructing lesion of the eustachian tube is noted. Fluid filled upper cervical esophagus. Right hilar adenopathy.   2D echo  - Left ventricle: The cavity size was normal. Wall thickness wasincreased in a pattern of mild LVH. The estimated ejectionfraction was 55%. Wall motion was normal; there were no regionalwall motion abnormalities. - Right ventricle: The cavity size was mildly dilated. Systolicfunction was mildly reduced. - Pulmonary arteries: PA peak pressure: 37 mm Hg (S). Impressions: No cardiac source of embolism was identified, butcannot be ruled out on the basis of this examination.  30 day cardiac monitoring - no afib  TEE 10/28/15  - Left ventricle: The cavity size was normal. Wall thickness was  normal. The estimated ejection fraction was 55%. Wall motion was  normal; there were no regional wall motion abnormalities. - Aortic valve: There was no stenosis. - Aorta: There was grade IV plaque in the descending thoracic  aorta. - Mitral valve: There was trivial regurgitation. - Left atrium: No evidence of thrombus in the atrial cavity or  appendage. -  Right ventricle: The cavity size was normal. Systolic function  was normal. - Atrial septum: No defect or patent foramen ovale was identified.  Echo contrast study showed no right-to-left atrial level shunt,  at baseline or with provocation. Impressions: - Only possible source of embolus noted for splenic infarct would  be extensive descending thoracic aorta plaque.  CT abd/pel with contrast  08/14/15 1. Fluid throughout the colon compatible with diarrhea, but otherwise no acute or inflammatory process in the abdomen or pelvis. 2. Extensive soft and calcified atherosclerosis in the aorta, its branches, and the pelvic arteries. No major arterial occlusion identified. Stable mild infrarenal abdominal aortic aneurysm, 29 mm. Recommend followup by Ultrasound in 5 years.  10/27/15 Splenic infarct. Small left inguinal hernia containing fat. Scattered diverticula in the colon without evidence of diverticulitis. Moderate-sized esophageal hiatal hernia.  Component     Latest Ref Rng 03/30/2015 04/20/2015 06/12/2015 08/19/2015  Cholesterol     0 - 200 mg/dL 161 096 045 409  Triglycerides     0.0 - 149.0 mg/dL 90 811.9 147.8 295.6 (H)  HDL  Cholesterol     >39.00 mg/dL 37 (L) 16.10 96.04 54.09  VLDL     0.0 - 40.0 mg/dL 18 81.1 91.4 78.2  LDL (calc)     0 - 99 mg/dL 84 74 83 91  Total CHOL/HDL Ratio      3.8 3 3 3   NonHDL       94.85 106.09 122.09  PTT Lupus Anticoagulant     0.0 - 43.6 sec      DRVVT     0.0 - 44.0 sec      Lupus Anticoag Interp           Beta-2 Glycoprotein I Ab, IgG     0 - 20 GPI IgG units      Beta-2-Glycoprotein I IgM     0 - 32 GPI IgM units      Beta-2-Glycoprotein I IgA     0 - 25 GPI IgA units      Anticardiolipin Ab,IgG,Qn     0 - 14 GPL U/mL      Anticardiolipin Ab,IgM,Qn     0 - 12 MPL U/mL      Anticardiolipin Ab,IgA,Qn     0 - 11 APL U/mL      Coagulation Factor VIII     57 - 163 %      Ristocetin Co-factor, Plasma     50 - 200 %        Von Willebrand Antigen, Plasma     50 - 200 %      Hemoglobin A1C     4.8 - 5.6 % 6.0 (H)     Mean Plasma Glucose      126     Recommendations-F5LEID:           Comment           Recommendations-PTGENE:           Additional Information           Interpretation           PDF Image           Antithrombin Activity     75 - 120 %      Protein C-Functional     73 - 180 %      Protein C, Total     60 - 150 %      Protein S-Functional     63 - 140 %      Protein S, Total     60 - 150 %      Homocysteine     0.0 - 15.0 umol/L       Component     Latest Ref Rng 10/27/2015  Cholesterol     0 - 200 mg/dL   Triglycerides     0.0 - 149.0 mg/dL   HDL Cholesterol     >95.62 mg/dL   VLDL     0.0 - 13.0 mg/dL   LDL (calc)     0 - 99 mg/dL   Total CHOL/HDL Ratio        NonHDL        PTT Lupus Anticoagulant     0.0 - 43.6 sec 39.4  DRVVT     0.0 - 44.0 sec 43.8  Lupus Anticoag Interp      Comment:  Beta-2 Glycoprotein I Ab, IgG     0 - 20 GPI IgG units <9  Beta-2-Glycoprotein I IgM     0 - 32 GPI IgM units <  9  Beta-2-Glycoprotein I IgA     0 - 25 GPI IgA units <9  Anticardiolipin Ab,IgG,Qn     0 - 14 GPL U/mL <9  Anticardiolipin Ab,IgM,Qn     0 - 12 MPL U/mL <9  Anticardiolipin Ab,IgA,Qn     0 - 11 APL U/mL <9  Coagulation Factor VIII     57 - 163 % 273 (H)  Ristocetin Co-factor, Plasma     50 - 200 % 145  Von Willebrand Antigen, Plasma     50 - 200 % 172  Hemoglobin A1C     4.8 - 5.6 %   Mean Plasma Glucose        Recommendations-F5LEID:      Comment (A)  Comment      Comment  Recommendations-PTGENE:      Comment  Additional Information      Comment  Interpretation      Note  PDF Image      .  Antithrombin Activity     75 - 120 % 137 (H)  Protein C-Functional     73 - 180 % 127  Protein C, Total     60 - 150 % 89  Protein S-Functional     63 - 140 % 84  Protein S, Total     60 - 150 % 121  Homocysteine     0.0 - 15.0 umol/L 17.1 (H)    Component     Latest Ref Rng 01/27/2016 02/25/2016  TSH     0.35 - 4.50 uIU/mL 2.59   Vitamin B12     211 - 911 pg/mL 193 (L) 470  Sed Rate     0 - 20 mm/hr 38 (H)    Component     Latest Ref Rng & Units 02/25/2016 03/04/2016  TIBC     250 - 450 ug/dL  409 (H)  UIBC     811 - 343 ug/dL  914 (H)  Iron     38 - 169 ug/dL  17 (L)  Iron Saturation     15 - 55 %  3 (LL)  Vitamin B12     211 - 911 pg/mL 470   Ferritin     30 - 400 ng/mL  13 (L)    Assessment: As you may recall, he is a 82 y.o. Caucasian male with PMH of hypertension, hyperlipidemia, COPD, diverticulitis, and von Willebrand's disease was admitted on 01/28/15 for dizziness and gait instability. MRI showed acute left frontal MCA/ACA punctate infarcts, as well as chronic right CR and left cerebellum. MRA head and CTA neck showed diffuse intracranial athero and left VA occlusion. Stroke work up showed normal 2D echo, but LDL 176. He was started on dural antiplatelet and recommend outpt PCSK9 inhibitors as he is not tolerating statins.  He was discharged home with home health. However, he was admitted again on 03/29/15 for similar symptoms. MRI showed new acute infarct at right frontal cortex and subcortical WM. LDL down to 84. He was continued on dural antiplatelet and started on praluent. 30 day cardiac event monitoring negative. After finished DAPT course, he was continued on plavix. 10/2015 he was admitted for splenic infarct, CT abd/pelvis with contrast showed a 29 mm AAA, and extensive soft and calcified atherosclerotic plaque throughout the aorta. TEE done showed grade IV plaque in the descending thoracic aorta. His splenic infarct was considered to be related to atherosclerosis of aorta and its branches. Pt has seen Dr. Cyndie Chime for von  Willebrand's disorder and considered possible mild type I. He was put back on DAPT until end of May when his ASA was stopped. He is now on plavix alone. Due to the recurrent strokes and splenic  infarcts, loop recorder was placed and so far no afib.   Found evidence of restless leg syndrome, microcytic anemia and low iron level. Put on iron pills and requip at night. Symptoms much improved. He also has CAD with PVCs, following with cardiology with medical treatment.    Plan:  - continue plavix and priluent for stroke prevention - continue loop recorder monitoring - follow up with Dr. Cyndie Chime and cardiology - continue requip for RLS. - check cholesterol and iron level next time when you see Dr. Para March.  - Follow up with your primary care physician for stroke risk factor modification. Recommend maintain blood pressure goal <130/80, diabetes with hemoglobin A1c goal below 7.0% and lipids with LDL cholesterol goal below 70 mg/dL.  - check BP at home and record and call PCP if medication adjustment needed. - follow up in 6 months.  I spent more than 25 minutes of face to face time with the patient. Greater than 50% of time was spent in counseling and coordination of care. We discussed restless leg syndrome and treatment options, iron supplement, BP management and continued cardiac monitoring.    No orders of the defined types were placed in this encounter.   Meds ordered this encounter  Medications  . DISCONTD: PRALUENT 150 MG/ML SOPN    Patient Instructions  - continue plavix and priluent for stroke prevention - continue loop recorder monitoring - follow up with Dr. Cyndie Chime and cardiology - continue requip for restless leg syndrome. - check cholesterol and iron level next time when you see Dr. Para March.  - Follow up with your primary care physician for stroke risk factor modification. Recommend maintain blood pressure goal <130/80, diabetes with hemoglobin A1c goal below 7.0% and lipids with LDL cholesterol goal below 70 mg/dL.  - check BP at home and record - follow up in 6 months.   Marvel Plan, MD PhD [facility] [address] [place], [place] [address] [phone]

## 2016-05-12 ENCOUNTER — Other Ambulatory Visit (INDEPENDENT_AMBULATORY_CARE_PROVIDER_SITE_OTHER): Payer: Medicare HMO

## 2016-05-12 DIAGNOSIS — D680 Von Willebrand's disease: Secondary | ICD-10-CM

## 2016-05-12 DIAGNOSIS — D735 Infarction of spleen: Secondary | ICD-10-CM

## 2016-05-12 DIAGNOSIS — D6801 Von willebrand disease, type 1: Secondary | ICD-10-CM

## 2016-05-14 LAB — VON WILLEBRAND PANEL
Factor VIII Activity: 195 % — ABNORMAL HIGH (ref 57–163)
Von Willebrand Ag: 160 % (ref 50–200)
Von Willebrand Factor: 135 % (ref 50–200)

## 2016-05-14 LAB — COAG STUDIES INTERP REPORT

## 2016-05-16 ENCOUNTER — Other Ambulatory Visit: Payer: Self-pay | Admitting: Family Medicine

## 2016-05-16 LAB — CUP PACEART REMOTE DEVICE CHECK: Date Time Interrogation Session: 20170901153719

## 2016-05-16 NOTE — Telephone Encounter (Signed)
Electronic refill request. Last Filled:     2 mL 5 10/07/2015  Please advise.

## 2016-05-17 NOTE — Telephone Encounter (Signed)
Sent. Thanks.   

## 2016-05-19 ENCOUNTER — Encounter: Payer: Self-pay | Admitting: Family Medicine

## 2016-05-20 ENCOUNTER — Other Ambulatory Visit: Payer: Self-pay

## 2016-05-20 MED ORDER — TRAMADOL HCL 50 MG PO TABS: 50.0000 mg | ORAL_TABLET | Freq: Three times a day (TID) | ORAL | 0 refills | Status: DC | PRN

## 2016-05-20 NOTE — Telephone Encounter (Signed)
Called in to [facility] - [place], [place] - [address]: [phone]  

## 2016-05-20 NOTE — Telephone Encounter (Signed)
Last refill 03/23/16 #30 +1, last OV 05/10/16. Ok to refill?

## 2016-05-23 ENCOUNTER — Ambulatory Visit (INDEPENDENT_AMBULATORY_CARE_PROVIDER_SITE_OTHER): Payer: Medicare HMO | Admitting: *Deleted

## 2016-05-23 ENCOUNTER — Ambulatory Visit (INDEPENDENT_AMBULATORY_CARE_PROVIDER_SITE_OTHER): Payer: Medicare HMO | Admitting: Oncology

## 2016-05-23 VITALS — BP 139/58 | HR 68 | Temp 97.5°F | Ht 69.0 in | Wt 157.3 lb

## 2016-05-23 DIAGNOSIS — I63429 Cerebral infarction due to embolism of unspecified anterior cerebral artery: Secondary | ICD-10-CM

## 2016-05-23 DIAGNOSIS — Z7902 Long term (current) use of antithrombotics/antiplatelets: Secondary | ICD-10-CM | POA: Diagnosis not present

## 2016-05-23 DIAGNOSIS — D735 Infarction of spleen: Secondary | ICD-10-CM | POA: Diagnosis not present

## 2016-05-23 NOTE — Progress Notes (Signed)
Hematology and Oncology Follow Up Visit  [name] [id number] [birth year] 82 y.o. 05/23/2016 3:22 PM   Principle Diagnosis: Encounter Diagnosis  Name Primary?  . Splenic infarction Yes  Clinical summary:  82 year old man I initially saw in consultation on 10/27/2015 when he presented with the acute onset of abdominal pain with findings consistent with acute splenic infarction. No evidence for thrombosis of the splenic vein or artery. No other obvious intra-abdominal pathology. No evidence for diverticulitis. Of note was a 08/14/2015 CT scan of the abdomen which showed a normal spleen, a 29 mm in diameter abdominal aortic aneurysm, and extensive soft and calcified atherosclerotic plaque throughout the aorta. The patient gave a history of von Willebrand's disease diagnosed about 30 years ago. One of his sons was the index case, currently 78 years old. His son's daughter, now age 5, also tested positive but has never had any clinical bleeding. I obtained a von Willebrand profile while he was in the hospital and all of the values were either high normal or higher than the highest control value. Some of this may reflect acute phase reaction to the splenic infarction. This does not exclude mild von Willebrand's disorder but certainly excludes severe VWD. Of note the patient had a prior stroke in June 2016. Brain studies at that time showed diffuse atheromatous disease and multifocal cerebrovascular stenosis. He was initially put on aspirin and Plavix. At time of recent admission he was on Plavix alone. Despite the fact that he was on dual antiplatelet agents and presumably had von Willebrand's disorder, he never had any excessive bleeding. He had a remote appendectomy, hernia repair, and dental extractions without any excessive bleeding. He denied any epistaxis, gum bleeding, hematochezia, melena, or hematuria. He did admit to bruising easy on the Plavix.  It appeared likely that his splenic infarct was  atheroembolic in etiology. A transesophageal echocardiogram did not show any cardiac source of emboli. No PFO. No ASD.  I recommended conservative treatment with observation and analgesics. There was no sign of any extension of the splenic infarct or hemorrhage. I recommended continuing Plavix and adding back the aspirin at least temporarily. His symptoms resolved almost completely over the next 72 hours. He was stable at time of follow-up visit with me on March 28.  Interim History:   He has had the von Willebrand profile repeated twice since hospital discharge, most recently on 05/12/2016, and values remain normal. He has had no further abdominal pain. He is getting intraocular injections, probably Avastin, at this time for macular degeneration. He is not having any dyspnea, chest pain, or palpitations. He denies any epistaxis, hematuria, hematochezia or melena. Stools do get dark when he takes iron.  A CT scan of the chest was done on 04/06/2016 for follow-up of a possible nodule seen on a x-ray of his left shoulder which included part of the lung. This turned out to be a small calcified granuloma in the superior segment of the left lower lobe with additional small calcified splenic granulomas. Although not mentioned, when I reviewed the scan specifically to look at his spleen, it appears unchanged compared with prior studies.   Medications: reviewed  Allergies:  Allergies  Allergen Reactions  . Atorvastatin Other (See Comments)    REACTION: muscles tense up   . Codeine Nausea And Vomiting  . Lisinopril Swelling and Other (See Comments)    Lips and face swelling  . Statins Other (See Comments)    Muscle tense up    Review of  Systems: See interim history Remaining ROS negative:   Physical Exam: Blood pressure (!) 139/58, pulse 68, temperature 97.5 F (36.4 C), temperature source Oral, height 5\' 9"  (1.753 m), weight 157 lb 4.8 oz (71.4 kg), SpO2 98 %. Wt Readings from Last 3  Encounters:  05/23/16 157 lb 4.8 oz (71.4 kg)  05/10/16 156 lb (70.8 kg)  04/26/16 153 lb 8 oz (69.6 kg)     General appearance: Well nourished Caucasian man who is hard of hearing. HENNT: Bilateral hearing aids. Pharynx no erythema, exudate, mass, or ulcer. No thyromegaly or thyroid nodules Lymph nodes: No cervical, supraclavicular, or axillary lymphadenopathy Breasts:  Lungs: Clear to auscultation, resonant to percussion throughout Heart: Regular rhythm, no murmur, no gallop, no rub, no click, no edema Abdomen: Soft, nontender, normal bowel sounds, no mass, no organomegaly Extremities: No edema, no calf tenderness Musculoskeletal: no joint deformities GU:  Vascular: Carotid pulses 2+, no bruits,  Neurologic: Alert, oriented, PERRLA, , cranial nerves grossly normal, motor strength 5 over 5, reflexes 1+ symmetric, upper body coordination normal, gait normal, Skin: Scattered small ecchymosis on the skin of his forearms  Lab Results: CBC W/Diff    Component Value Date/Time   WBC 5.5 02/22/2016 2127   RBC 4.43 02/22/2016 2127   HGB 9.5 (L) 02/22/2016 2127   HGB 16.9 05/23/2006 1300   HCT 32.5 (L) 02/22/2016 2127   HCT 49.9 05/23/2006 1300   PLT 321 02/22/2016 2127   PLT 256 05/23/2006 1300   MCV 73.4 (L) 02/22/2016 2127   MCV 86.3 05/23/2006 1300   MCH 21.4 (L) 02/22/2016 2127   MCHC 29.2 (L) 02/22/2016 2127   RDW 16.7 (H) 02/22/2016 2127   RDW 13.7 05/23/2006 1300   LYMPHSABS 1.2 01/27/2016 1440   LYMPHSABS 1.1 05/23/2006 1300   MONOABS 0.7 01/27/2016 1440   MONOABS 0.6 05/23/2006 1300   EOSABS 0.1 01/27/2016 1440   EOSABS 0.1 05/23/2006 1300   BASOSABS 0.0 01/27/2016 1440   BASOSABS 0.0 05/23/2006 1300     Chemistry      Component Value Date/Time   NA 135 03/08/2016 1003   K 3.9 03/08/2016 1003   CL 101 03/08/2016 1003   CO2 26 03/08/2016 1003   BUN 15 03/08/2016 1003   CREATININE 1.26 (H) 03/08/2016 1003      Component Value Date/Time   CALCIUM 8.9 03/08/2016  1003   ALKPHOS 85 01/27/2016 1440   AST 17 01/27/2016 1440   ALT 9 01/27/2016 1440   BILITOT 0.3 01/27/2016 1440       Radiological Studies: No results found.  Impression:  #1. Splenic infarction. March 2017. Likely atheroembolic. Symptoms have completely resolved. No indication for anticoagulation. He is on Plavix as his antiplatelet agent. I do not see aspirin on his list.  #2. Microcytic anemia. Hemoglobin dropped at time of the splenic infarction down to 10 g. Currently 9.5. MCV 73. He should be put on an iron supplement. If no improvement in his hemoglobin after a two-month interval of follow-up, he should be further evaluated. I did not notice this until after the patient left the office. I did not schedule a regular follow-up visit here.   CC: Patient Care Team: Joaquim NamGraham S Duncan, MD as PCP - General (Family Medicine)   Levert FeinsteinJAMES M Rasheema Truluck, MD 10/2/20173:22 PM

## 2016-05-23 NOTE — Progress Notes (Signed)
Carelink Summary Report / Loop Recorder 

## 2016-05-23 NOTE — Patient Instructions (Signed)
Return as needed

## 2016-05-26 ENCOUNTER — Other Ambulatory Visit: Payer: Self-pay | Admitting: Neurology

## 2016-05-26 DIAGNOSIS — G2581 Restless legs syndrome: Secondary | ICD-10-CM

## 2016-05-29 ENCOUNTER — Telehealth: Payer: Self-pay | Admitting: Family Medicine

## 2016-05-29 DIAGNOSIS — D509 Iron deficiency anemia, unspecified: Secondary | ICD-10-CM

## 2016-05-29 NOTE — Telephone Encounter (Signed)
Call pt.  F/u note from hematology.    Hemoglobin dropped at time of the splenic infarction down to 10 g. Currently 9.5. MCV 73. He should be put on an iron supplement, if not already on one. If no improvement in his hemoglobin after a two-month interval of follow-up, he should be further evaluated. Dr. Cyndie ChimeGranfortuna did not notice this until after the patient left the hematology office, so he sent me a note to handle it.    I want patient to start taking 325mg  iron daily if not already doing so.  It is already on med list.  Recheck iron and cbc in about 2 months.   Orders for labs are in.  Thanks.

## 2016-05-30 NOTE — Telephone Encounter (Signed)
No answer, no VM available.  Will try again later. 

## 2016-05-30 NOTE — Telephone Encounter (Signed)
Thanks

## 2016-05-30 NOTE — Telephone Encounter (Signed)
Patient is already taking 3 Iron tablets per day.  Lab appt scheduled for 2 months.

## 2016-06-03 ENCOUNTER — Encounter: Payer: Self-pay | Admitting: Family Medicine

## 2016-06-03 ENCOUNTER — Ambulatory Visit (INDEPENDENT_AMBULATORY_CARE_PROVIDER_SITE_OTHER): Payer: Medicare HMO | Admitting: Family Medicine

## 2016-06-03 VITALS — BP 128/72 | HR 77 | Temp 97.8°F | Wt 156.0 lb

## 2016-06-03 DIAGNOSIS — E785 Hyperlipidemia, unspecified: Secondary | ICD-10-CM

## 2016-06-03 DIAGNOSIS — E611 Iron deficiency: Secondary | ICD-10-CM | POA: Diagnosis not present

## 2016-06-03 DIAGNOSIS — M25512 Pain in left shoulder: Secondary | ICD-10-CM | POA: Diagnosis not present

## 2016-06-03 DIAGNOSIS — M25561 Pain in right knee: Secondary | ICD-10-CM

## 2016-06-03 DIAGNOSIS — M25562 Pain in left knee: Secondary | ICD-10-CM

## 2016-06-03 NOTE — Progress Notes (Signed)
Fall cautions d/w pt, this week.  He was exercising his shoulder for ROM and slipped.  No syncope.  His knee pain at the time likely contributed to the fall per patient.  His knee pain is controlled with prn tramadol, as needed.  Non ADE on med.    HLD.  Still on praluent tx for statin failure.  Plan to recheck later on.  D/w pt.    D/w pt about rechecking CBC in a few weeks.    Meds, vitals, and allergies reviewed.   ROS: Per HPI unless specifically indicated in ROS section   nad ncat Mmm rrr ctab Abd soft  Ext w/o edema.

## 2016-06-03 NOTE — Patient Instructions (Signed)
Recheck labs as scheduled.   Take care.  Glad to see you.  Update me as needed.

## 2016-06-05 NOTE — Assessment & Plan Note (Signed)
Continue tramadol when necessary. No adverse effect on medication. Usually helps his pain quite a bit. Fall cautions discussed with patient.

## 2016-06-05 NOTE — Assessment & Plan Note (Signed)
Recheck lipids later on. He agrees.  I looked at his maintenance items otherwise. He is up-to-date otherwise. Discussed with patient.

## 2016-06-05 NOTE — Assessment & Plan Note (Signed)
Continue home exercises.

## 2016-06-05 NOTE — Assessment & Plan Note (Signed)
Recheck CBC later on.

## 2016-06-20 ENCOUNTER — Encounter: Payer: Self-pay | Admitting: Family Medicine

## 2016-06-20 ENCOUNTER — Ambulatory Visit (INDEPENDENT_AMBULATORY_CARE_PROVIDER_SITE_OTHER): Payer: Medicare HMO | Admitting: Family Medicine

## 2016-06-20 DIAGNOSIS — M25512 Pain in left shoulder: Secondary | ICD-10-CM

## 2016-06-20 MED ORDER — HYDROCODONE-ACETAMINOPHEN 5-325 MG PO TABS: 1.0000 | ORAL_TABLET | Freq: Four times a day (QID) | ORAL | 0 refills | Status: DC | PRN

## 2016-06-20 NOTE — Progress Notes (Signed)
Pre visit review using our clinic review tool, if applicable. No additional management support is needed unless otherwise documented below in the visit note. 

## 2016-06-20 NOTE — Patient Instructions (Signed)
Use the hydrocodone as needed for pain.  Sedation caution.   Take care.  Glad to see you.  Ask about seeing Dr. Patsy Lageropland about your shoulder.

## 2016-06-20 NOTE — Progress Notes (Signed)
L shoulder pain, worse after pushing off to get out of a chair about 1 week ago.  Pain with laying on L side.  Pain with ROM.   He was previously seen for left shoulder pain, x-rays reviewed at office visit today. He had previously used tramadol and home shoulder exercises with improvement in pain and range of motion. His internal rotation is still good, improved from previous, range of motion otherwise is restricted due to pain.  PMH and SH reviewed  ROS: Per HPI unless specifically indicated in ROS section   Meds, vitals, and allergies reviewed.   No apparent distress Neck is supple, normal range of motion. No pain on range of motion. Left shoulder pain on range of motion. He has significant pain with active abduction and flexion. Pain with external rotation. His internal rotation is still good. He cannot flex and abduct his shoulder much at all, but I can passively move his shoulder to ~90 flexion and abduction. It is unclear to me if further restriction beyond 90 is due to pain and/or from her frozen shoulder. Elbow has normal range of motion.

## 2016-06-21 ENCOUNTER — Ambulatory Visit (INDEPENDENT_AMBULATORY_CARE_PROVIDER_SITE_OTHER): Payer: Medicare HMO | Admitting: *Deleted

## 2016-06-21 DIAGNOSIS — I63429 Cerebral infarction due to embolism of unspecified anterior cerebral artery: Secondary | ICD-10-CM | POA: Diagnosis not present

## 2016-06-21 NOTE — Assessment & Plan Note (Signed)
Clearly worse in the meantime. The concern is for a tear. He still could have a possibly frozen shoulder. Discussed with patient. At this point needs better pain control. No effect from tramadol now. Change to Vicodin. I want him to come back and see Dr. Dallas Schimkeopeland in a few days for recheck. Discussed with Dr. Dallas Schimkeopeland in the midst of the office visit. I appreciate his input. Routine cautions given regarding hydrocodone.. >25 minutes spent in face to face time with patient, >50% spent in counselling or coordination of care.

## 2016-06-22 NOTE — Progress Notes (Signed)
Carelink Summary Report / Loop Recorder 

## 2016-06-24 ENCOUNTER — Other Ambulatory Visit: Payer: Self-pay

## 2016-06-24 NOTE — Telephone Encounter (Signed)
Last filled 05-20-16 #30 Last OV 06-20-16 Next OV 06-29-16

## 2016-06-26 MED ORDER — TRAMADOL HCL 50 MG PO TABS: 50.0000 mg | ORAL_TABLET | Freq: Three times a day (TID) | ORAL | 1 refills | Status: DC | PRN

## 2016-06-26 NOTE — Telephone Encounter (Signed)
Please call in.  Thanks.   

## 2016-06-27 NOTE — Telephone Encounter (Signed)
Medication phoned to pharmacy.  

## 2016-06-29 ENCOUNTER — Ambulatory Visit (INDEPENDENT_AMBULATORY_CARE_PROVIDER_SITE_OTHER): Payer: Medicare HMO | Admitting: Family Medicine

## 2016-06-29 ENCOUNTER — Encounter: Payer: Self-pay | Admitting: Family Medicine

## 2016-06-29 VITALS — BP 140/76 | HR 64 | Temp 97.7°F | Ht 69.0 in | Wt 161.0 lb

## 2016-06-29 DIAGNOSIS — M25512 Pain in left shoulder: Secondary | ICD-10-CM | POA: Diagnosis not present

## 2016-06-29 NOTE — Patient Instructions (Signed)

## 2016-06-29 NOTE — Progress Notes (Signed)
Dr. Karleen HampshireSpencer T. Chay Mazzoni, MD, CAQ Sports Medicine [facility] [address] [place], [id number] Phone: [phone] Fax: [phone]  06/29/2016  Patient: Martin [name], MRN: [id number], DOB: 1926-06-03, 82 y.o.  Primary Physician:  Crawford GivensGraham Duncan, MD   Chief Complaint  Patient presents with  . Shoulder Pain    Left   Subjective:   [name] is a 82 y.o. very pleasant male patient who presents with the following:  Left shoulder pain: Put pressure of a back-hoe.  Now much worse.   Patient has had some pain off and on for a few months in the left shoulder, but he put a tremendous amount of pressure on a back hoe. She'll send, the patient has not been able to abduct or flex his shoulder beyond about 30-40. He is in a significant amount of pain. Internal and external rotation strength has been preserved. No history of fracture or traumatic injury aside from this.  Relatively complicated medical history with a history of stroke, COPD, as well as splenic infarction.  Past Medical History, Surgical History, Social History, Family History, Problem List, Medications, and Allergies have been reviewed and updated if relevant.  Patient Active Problem List   Diagnosis Date Noted  . Pain in joint, shoulder region 03/30/2016  . Lower back pain 03/30/2016  . Tennis elbow 03/30/2016  . Iron deficiency 03/07/2016  . Restless leg syndrome 03/04/2016  . Leg pain 01/28/2016  . Vitamin B 12 deficiency 01/28/2016  . Knee pain 12/06/2015  . Fatigue 12/06/2015  . 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/27/2015  . Palpitations 04/03/2015  . HLD (hyperlipidemia) 04/03/2015  . Cerebral infarction due to stenosis of cerebral artery (HCC) 04/03/2015  . Stroke (HCC)   . Advance care planning 08/21/2014  . Memory change 08/21/2014  . Ectatic abdominal aorta (HCC) 08/16/2014  . COPD exacerbation (HCC)   . Abdominal pain   . GERD (gastroesophageal reflux disease) 05/29/2014  . Left flank  pain, chronic 01/22/2013  . Hypokalemia 12/28/2012  . Angioedema of lips 12/28/2012  . Medicare annual wellness visit, subsequent 08/15/2011  . Angiodysplasia of intestine 11/30/2009  . HEARING LOSS, BILATERAL 07/27/2009  . DYSPHAGIA 08/21/2008  . Barrett's esophagus 05/16/2008  . TEMPOROMANDIBULAR JOINT DISORDER 10/16/2007  . UMBILICAL HERNIA 09/22/2007  . Essential hypertension 05/15/2007  . HEMORRHOIDS 05/15/2007    Past Medical History:  Diagnosis Date  . Back pain    improved with gabapentin as of 2015  . Barrett esophagus 09/2003  . Diverticulosis   . GERD (gastroesophageal reflux disease)   . GI AVM (gastrointestinal arteriovenous vascular malformation)   . Hearing loss   . Hemorrhoids   . Hemorrhoids   . History of Holter monitoring    a. holter 9/17: NSR, PVCs; no sustained arrhythmia; PVC burden 8%  . History of nuclear stress test    a. Myoview 8/17: Intermediate risk, inferior ischemia, not gated  . History of prostatitis   . Hyperlipidemia   . Hypertension   . Macular degeneration, wet (HCC)    receiving intra-ocular injections ([facility])  . PUD (peptic ulcer disease)   . Stroke [facility](HCC)     Past Surgical History:  Procedure Laterality Date  . EP IMPLANTABLE DEVICE N/A 12/24/2015   Procedure: Loop Recorder Insertion;  Surgeon: Hillis RangeJames Allred, MD;  Location: [facility] INVASIVE CV LAB;  Service: Cardiovascular;  Laterality: N/A;  . INGUINAL HERNIA REPAIR  2001  . LAPAROSCOPIC APPENDECTOMY N/A 07/07/2014   Procedure: APPENDECTOMY LAPAROSCOPIC;  Surgeon:  Manus RuddMatthew Tsuei, MD;  [facility];  Service: General;  Laterality: N/A;  . TEE WITHOUT CARDIOVERSION N/A 10/28/2015   Procedure: TRANSESOPHAGEAL ECHOCARDIOGRAM (TEE);  Surgeon: Laurey Moralealton S McLean, MD;  [facility];  Service: Cardiovascular;  Laterality: N/A;    Social History   Social History  . Marital status: Married    Spouse name: Marcelina Morelmma [place]  . Number of children: N/A  . Years of education: N/A    Occupational History  . Retired   .  Retired   Social History Main Topics  . Smoking status: Former Smoker    Packs/day: 0.00    Years: 36.00    Types: Cigarettes  . Smokeless tobacco: Never Used     Comment: Quit in 1983  . Alcohol use No  . Drug use: No  . Sexual activity: Not on file   Other Topics Concern  . Not on file   Social History Narrative   6th grade. Married 1952 1 son, 1 dtr - died at 5 months. Work - Management consultantsaw-mill, Engineer, drillingtractor trailer driver, Catering manageretc.   Retired-fulltime. "[employer] ACP - yes DNR, yes short-term mechanical ventilarion, no heroic or futile measures in face of irreversible.    Family History  Problem Relation Age of Onset  . Heart attack Father   . Coronary artery disease Father   . Hypertension Mother   . Pancreatic cancer Mother     Died at 5186.  . Cancer Mother     Pancreatic cancer  . Arthritis Sister   . Hyperlipidemia Sister   . Prostate cancer Brother   . Cancer Brother     Bone Cancer secondary to Prostate Cancer  . Bleeding Disorder Son   . Diabetes Neg Hx     Allergies  Allergen Reactions  . Atorvastatin Other (See Comments)    REACTION: muscles tense up   . Codeine Nausea And Vomiting  . Lisinopril Swelling and Other (See Comments)    Lips and face swelling  . Statins Other (See Comments)    Muscle tense up    Medication list reviewed and updated in full in [facility].  GEN: No fevers, chills. Nontoxic. Primarily MSK c/o today. MSK: Detailed in the HPI GI: tolerating PO intake without difficulty Neuro: No numbness, parasthesias, or tingling associated. Otherwise the pertinent positives of the ROS are noted above.   Objective:   BP 140/76   Pulse 64   Temp 97.7 F (36.5 C) (Oral)   Ht 5\' 9"  (1.753 m)   Wt 161 lb (73 kg)   BMI 23.78 kg/m    GEN: WDWN, NAD, Non-toxic, Alert & Oriented x 3 HEENT: Atraumatic, Normocephalic.  Ears and Nose: No external deformity. EXTR: No clubbing/cyanosis/edema NEURO: Normal  gait.  PSYCH: Normally interactive. Conversant. Not depressed or anxious appearing.  Calm demeanor.    Left shoulder: Nontender along the length of the humerus, clavicle, and AC joint.  Patient is able to abduct his shoulder to proximally 35 actively. Strength 3/5. Passive range of motion is to 135. Flexion to approximately 40 actively. Strength 3/5. Passive range of motion is to 135. Internal and external range of motion strength is 5/5.  Drop test is positive  All other special testing is equivocal secondary to pain  Radiology: CLINICAL DATA:  Left shoulder pain for 3 weeks.  EXAM: LEFT SHOULDER - 2+ VIEW  COMPARISON:  Chest x-ray March 29, 2015, August 14, 2015, January 28, 2015, February 22, 2016  FINDINGS: There is no evidence of  fracture or dislocation. There are minimal degenerative joint changes of the left shoulder. There is a 3.6 mm nodule in the left upper lobe not seen on multiple prior chest x-rays. Soft tissues are unremarkable.  IMPRESSION: No acute fracture dislocation of left shoulder.  Minimal degenerative joint changes of left shoulder.  3.6 mm nodule in the left upper lobe not seen on multiple prior chest x-rays. Further evaluation with chest CT is recommended.   Electronically Signed   By: Sherian Rein M.D.   On: 03/29/2016 15:48  Assessment and Plan:   Acute pain of left shoulder - Plan: Ambulatory referral to Physical Therapy  >25 minutes spent in face to face time with patient, >50% spent in counselling or coordination of care   I suspect the patient had at least a partial thickness versus full-thickness rotator cuff tear of that a minimum supraspinatus plus or minus other portions of his rotator cuff.  I talked at length both the patient and his wife about treatment options. He is almost 82 years old with history of stroke and COPD as well as a recent splenic infarct. I recommendation of and the patient agrees with this would be to go for  nonoperative management if he possibly can do this. I think that this is reasonable in this patient setting. We also discussed surgery for rotator cuff tear and its recovery process. They both agreed with trial of nonoperative management. We will send him for physical therapy, and also gave them the moon shoulder protocol.  Follow-up: Return in about 6 weeks (around 08/10/2016).  There are no discontinued medications. Orders Placed This Encounter  Procedures  . Ambulatory referral to Physical Therapy    Signed,  Karleen Hampshire T. Tameron Lama, MD     Medication List       Accurate as of 06/29/16  1:55 PM. Always use your most recent med list.          ARTIFICIAL TEARS OP Apply 1 drop to eye daily as needed (itchy / watery eyes).   AVASTIN IV Place 1 Applicatorful into both eyes See admin instructions. Right eye every 9 weeks Left eye every 6 weeks.   cetirizine 10 MG tablet Commonly known as:  ZYRTEC Take 10 mg by mouth daily as needed for allergies.   clopidogrel 75 MG tablet Commonly known as:  PLAVIX TAKE 1 TABLET BY MOUTH DAILY   diltiazem 180 MG 24 hr capsule Commonly known as:  CARDIZEM CD TAKE ONE CAPSULE BY MOUTH DAILY   fluticasone 50 MCG/ACT nasal spray Commonly known as:  FLONASE Place 2 sprays into both nostrils daily as needed for allergies.   gabapentin 300 MG capsule Commonly known as:  NEURONTIN TAKE ONE CAPSULE BY MOUTH TWICE A DAY   gemfibrozil 600 MG tablet Commonly known as:  LOPID Take 1 tablet (600 mg total) by mouth 2 (two) times daily.   hydrochlorothiazide 25 MG tablet Commonly known as:  HYDRODIURIL Take 1 tablet (25 mg total) by mouth daily.   HYDROcodone-acetaminophen 5-325 MG tablet Commonly known as:  NORCO/VICODIN Take 1 tablet by mouth every 6 (six) hours as needed for moderate pain (sedation caution).   Iron 325 (65 Fe) MG Tabs Take 325 mg by mouth 3 (three) times daily.   loperamide 2 MG capsule Commonly known as:  IMODIUM Take 1  capsule (2 mg total) by mouth 3 (three) times daily as needed for diarrhea or loose stools.   meclizine 25 MG tablet Commonly known as:  ANTIVERT Take  0.5 tablets (12.5 mg total) by mouth 3 (three) times daily as needed for dizziness.   nitroGLYCERIN 0.4 MG SL tablet Commonly known as:  NITROSTAT Place 1 tablet (0.4 mg total) under the tongue every 5 (five) minutes as needed for chest pain (max 3 doses in 15 minutes).   OCUVITE ADULT 50+ PO Take 1 capsule by mouth daily.   pantoprazole 40 MG tablet Commonly known as:  PROTONIX TAKE ONE TABLET BY MOUTH EACH MORNING   PRALUENT 150 MG/ML Sopn Generic drug:  Alirocumab INJECT 150MG  (CONTENTS OF ONE SYRINGE) INTO THE SKIN ONCE EVERY 14 DAYS.   ranitidine 150 MG tablet Commonly known as:  ZANTAC Take 1 tablet (150 mg total) by mouth 2 (two) times daily.   rOPINIRole 1 MG tablet Commonly known as:  REQUIP TAKE ONE TABLET BY MOUTH EVERY NIGHT AT BEDTIME   sucralfate 1 g tablet Commonly known as:  CARAFATE TAKE 1 TABLET BY MOUTH BEFORE LAYING DOWN FOR NAP ORAS NEEDED   tamsulosin 0.4 MG Caps capsule Commonly known as:  FLOMAX Take 0.4 mg by mouth daily.   traMADol 50 MG tablet Commonly known as:  ULTRAM Take 1 tablet (50 mg total) by mouth every 8 (eight) hours as needed for moderate pain or severe pain.   vitamin B-12 1000 MCG tablet Commonly known as:  CYANOCOBALAMIN Take 2 tablets (2,000 mcg total) by mouth daily.

## 2016-06-29 NOTE — Progress Notes (Signed)
Pre visit review using our clinic review tool, if applicable. No additional management support is needed unless otherwise documented below in the visit note. 

## 2016-07-02 LAB — CUP PACEART REMOTE DEVICE CHECK
Date Time Interrogation Session: 20171001173611
Implantable Pulse Generator Implant Date: 20170504

## 2016-07-02 NOTE — Progress Notes (Signed)
Carelink summary report received. Battery status OK. Normal device function. No new symptom episodes, tachy episodes, brady, or pause episodes. No new AF episodes. Monthly summary reports and ROV/PRN 

## 2016-07-12 ENCOUNTER — Ambulatory Visit (INDEPENDENT_AMBULATORY_CARE_PROVIDER_SITE_OTHER): Payer: Medicare HMO | Admitting: Physician Assistant

## 2016-07-12 ENCOUNTER — Encounter: Payer: Self-pay | Admitting: Physician Assistant

## 2016-07-12 ENCOUNTER — Encounter (INDEPENDENT_AMBULATORY_CARE_PROVIDER_SITE_OTHER): Payer: Self-pay

## 2016-07-12 VITALS — BP 142/60 | HR 80 | Ht 69.0 in | Wt 165.0 lb

## 2016-07-12 DIAGNOSIS — I493 Ventricular premature depolarization: Secondary | ICD-10-CM | POA: Diagnosis not present

## 2016-07-12 DIAGNOSIS — I10 Essential (primary) hypertension: Secondary | ICD-10-CM

## 2016-07-12 DIAGNOSIS — I2510 Atherosclerotic heart disease of native coronary artery without angina pectoris: Secondary | ICD-10-CM

## 2016-07-12 DIAGNOSIS — Z8673 Personal history of transient ischemic attack (TIA), and cerebral infarction without residual deficits: Secondary | ICD-10-CM

## 2016-07-12 NOTE — Progress Notes (Signed)
Cardiology Office Note:    Date:  07/12/2016   ID:  [name], DOB [birth year], MRN [id number]  PCP:  [name], MD  Cardiologist:Dr. Hillis Range >> will arrange FU with Dr. Everette Rank  Electrophysiologist: Dr. Hillis Range  Referring MD: Joaquim Nam, MD   Chief Complaint  Patient presents with  . Follow-up    chest pain    History of Present Illness:    [name] is a 82 y.o. male with a hx of Cryptogenic stroke in 6/16 and splenic infarct status post ILR, HTN, HL, COPD, von Willebrand's disease. Patient was seen by Dr. Johney Frame 5/17 arrange implantation of Linq ILR.  Patient was seen in the emergency room 02/22/16 with chest pain. Chest x-ray was unremarkable aside from demonstrating atherosclerosis. Cardiac markers were normal. ECG was unremarkable.  I saw him in follow-up in 7/17. He noted one episode of chest discomfort that was left-sided and only lasted a few minutes.  I reviewed his case with Dr. Eldridge Dace. I set him up for a nuclear stress test which demonstrated inferior ischemia. This was an intermediate risk study. I reviewed the study with Dr. Eldridge Dace and we recommended medical therapy unless the patient is symptomatic.   Last seen 04/11/16.  He returns for FU.  Here with his wife.  He is doing well.  He denies chest pain, shortness of breath, syncope, orthopnea, PND, edema.    Prior CV studies that were reviewed today include:    Chest CT 04/06/16 IMPRESSION: 1. The nodule questioned on left shoulder films represents a small calcified granuloma within the superior segment of the left lower lobe. There also are small calcified splenic granulomas consistent with prior granulomatous disease. 2. Moderate thoracic aortic atherosclerosis. 3. Calcification of the coronary arteries diffusely. 4. Paraseptal emphysema.  Myoview 03/25/16 Medium size, moderate intensity (SDS 5) reversible inferior perfusion defect which could suggest ischemia. Study was not  gated due to frequent ventricular ectopy. This is an intermediate risk study.  TEE 10/28/15 EF 55%, normal wall motion, grade 4 plaque descending thoracic aorta, trivial MR, normal RVSF, no right-to-left atrial shunt  Event monitor 8/16 Sinus rhythm Rare pacs and pvcs No sustained arrhythmias No afib  Echo 6/16 Mild LVH, EF 85%, normal wall motion, mildly reduced RVSF, PASP 37 mmHg  Past Medical History:  Diagnosis Date  . Back pain    improved with gabapentin as of 2015  . Barrett esophagus 09/2003  . Diverticulosis   . GERD (gastroesophageal reflux disease)   . GI AVM (gastrointestinal arteriovenous vascular malformation)   . Hearing loss   . Hemorrhoids   . Hemorrhoids   . History of Holter monitoring    a. holter 9/17: NSR, PVCs; no sustained arrhythmia; PVC burden 8%  . History of nuclear stress test    a. Myoview 8/17: Intermediate risk, inferior ischemia, not gated  . History of prostatitis   . Hyperlipidemia   . Hypertension   . Macular degeneration, wet (HCC)    receiving intra-ocular injections ([facility])  . PUD (peptic ulcer disease)   . Stroke [facility])     Past Surgical History:  Procedure Laterality Date  . EP IMPLANTABLE DEVICE N/A 12/24/2015   Procedure: Loop Recorder Insertion;  Surgeon: Hillis Range, MD;  [facility] INV[facility] LAB;  Service: Cardiovascular;  Laterality: N/A;  . INGUINAL HERNIA REPAIR  2001  . LAPAROSCOPIC APPENDECTOMY N/A 07/07/2014   Procedure: APPENDECTOMY LAPAROSCOPIC;  Surgeon: Manus Rudd, MD;  Location: [facility] OR;  Service: General;  Laterality: N/A;  . TEE WITHOUT CARDIOVERSION N/A 10/28/2015   Procedure: TRANSESOPHAGEAL ECHOCARDIOGRAM (TEE);  Surgeon: Laurey Moralealton S McLean, MD;  [facility];  Service: Cardiovascular;  Laterality: N/A;    Current Medications: Current Meds  Medication Sig  . Bevacizumab (AVASTIN IV) Place 1 Applicatorful into both eyes See admin instructions. Right eye every 9 weeks Left eye every 6 weeks.  .  cetirizine (ZYRTEC) 10 MG tablet Take 10 mg by mouth daily as needed for allergies.   [name] Kitchen. clopidogrel (PLAVIX) 75 MG tablet TAKE 1 TABLET BY MOUTH DAILY  . diltiazem (CARDIZEM CD) 180 MG 24 hr capsule TAKE ONE CAPSULE BY MOUTH DAILY  . Ferrous Sulfate (IRON) 325 (65 Fe) MG TABS Take 325 mg by mouth 3 (three) times daily.  . fluticasone (FLONASE) 50 MCG/ACT nasal spray Place 2 sprays into both nostrils daily as needed for allergies.  [name] Kitchen. gabapentin (NEURONTIN) 300 MG capsule TAKE ONE CAPSULE BY MOUTH TWICE A DAY  . gemfibrozil (LOPID) 600 MG tablet Take 1 tablet (600 mg total) by mouth 2 (two) times daily.  . hydrochlorothiazide (HYDRODIURIL) 25 MG tablet Take 1 tablet (25 mg total) by mouth daily.  [name] Kitchen. HYDROcodone-acetaminophen (NORCO/VICODIN) 5-325 MG tablet Take 1 tablet by mouth every 6 (six) hours as needed for moderate pain (sedation caution).  . Hypromellose (ARTIFICIAL TEARS OP) Apply 1 drop to eye daily as needed (itchy / watery eyes).  [name] Kitchen. loperamide (IMODIUM) 2 MG capsule Take 1 capsule (2 mg total) by mouth 3 (three) times daily as needed for diarrhea or loose stools.  . meclizine (ANTIVERT) 25 MG tablet Take 0.5 tablets (12.5 mg total) by mouth 3 (three) times daily as needed for dizziness.  . Multiple Vitamins-Minerals (OCUVITE ADULT 50+ PO) Take 1 capsule by mouth daily.    . nitroGLYCERIN (NITROSTAT) 0.4 MG SL tablet Place 1 tablet (0.4 mg total) under the tongue every 5 (five) minutes as needed for chest pain (max 3 doses in 15 minutes).  . pantoprazole (PROTONIX) 40 MG tablet TAKE ONE TABLET BY MOUTH EACH MORNING  . PRALUENT 150 MG/ML SOPN INJECT 150MG  (CONTENTS OF ONE SYRINGE) INTO THE SKIN ONCE EVERY 14 DAYS.  [name] Kitchen. ranitidine (ZANTAC) 150 MG tablet Take 1 tablet (150 mg total) by mouth 2 (two) times daily.  [name] Kitchen. rOPINIRole (REQUIP) 1 MG tablet TAKE ONE TABLET BY MOUTH EVERY NIGHT AT BEDTIME  . sucralfate (CARAFATE) 1 g tablet TAKE 1 TABLET BY MOUTH BEFORE LAYING DOWN FOR NAP ORAS NEEDED  .  tamsulosin (FLOMAX) 0.4 MG CAPS capsule Take 0.4 mg by mouth daily.   . traMADol (ULTRAM) 50 MG tablet Take 1 tablet (50 mg total) by mouth every 8 (eight) hours as needed for moderate pain or severe pain.  . vitamin B-12 (CYANOCOBALAMIN) 1000 MCG tablet Take 2 tablets (2,000 mcg total) by mouth daily.     Allergies:   Atorvastatin; Codeine; Lisinopril; and Statins   Social History   Social History  . Marital status: Married    Spouse name: Marcelina Morelmma Glaude  . Number of children: N/A  . Years of education: N/A   Occupational History  . Retired   .  Retired   Social History Main Topics  . Smoking status: Former Smoker    Packs/day: 0.00    Years: 36.00    Types: Cigarettes  . Smokeless tobacco: Never Used     Comment: Quit in 1983  . Alcohol use No  . Drug use: No  . Sexual activity:  Not Asked   Other Topics Concern  . None   Social History Narrative   6th grade. Married January 23, 1951 1 son, 1 dtr - died at 5 months. Work - Management consultant, Engineer, drilling, Catering manager.   Retired-fulltime. "[employer]". ACP - yes DNR, yes short-term mechanical ventilarion, no heroic or futile measures in face of irreversible.     Family History:  The patient's family history includes Arthritis in his sister; Bleeding Disorder in his son; Cancer in his brother and mother; Coronary artery disease in his father; Heart attack in his father; Hyperlipidemia in his sister; Hypertension in his mother; Pancreatic cancer in his mother; Prostate cancer in his brother.   ROS:   Please see the history of present illness.    Review of Systems  Musculoskeletal: Positive for myalgias.   All other systems reviewed and are negative.   EKGs/Labs/Other Test Reviewed:    EKG:  EKG is  ordered today.  The ekg ordered today demonstrates NSR, HR 80, normal axis, PVCs, QTc 431, no change from prior tracing.   Recent Labs: 01/27/2016: ALT 9; TSH 2.59 02/22/2016: Hemoglobin 9.5; Platelets 321 03/08/2016: BUN 15; Creat 1.26; Magnesium  2.1; Potassium 3.9; Sodium 135   Recent Lipid Panel    Component Value Date/Time   CHOL 177 08/19/2015 0836   TRIG 153.0 (H) 08/19/2015 0836   HDL 55.10 08/19/2015 0836   CHOLHDL 3 08/19/2015 0836   VLDL 30.6 08/19/2015 0836   LDLCALC 91 08/19/2015 0836   LDLDIRECT 167.2 08/12/2013 1024     Physical Exam:    VS:  BP (!) 142/60   Pulse 80   Ht 5\' 9"  (1.753 m)   Wt 165 lb (74.8 kg)   BMI 24.37 kg/m     Wt Readings from Last 3 Encounters:  07/12/16 165 lb (74.8 kg)  06/29/16 161 lb (73 kg)  06/20/16 159 lb (72.1 kg)     Physical Exam  Constitutional: He is oriented to person, place, and time. He appears well-developed and well-nourished. No distress.  HENT:  Head: Normocephalic and atraumatic.  Eyes: No scleral icterus.  Neck: Normal range of motion. No JVD present.  Cardiovascular: Normal rate, regular rhythm, S1 normal and S2 normal.   No murmur heard. Pulmonary/Chest: Effort normal and breath sounds normal. He has no wheezes. He has no rhonchi. He has no rales.  Abdominal: Soft. There is no tenderness.  Musculoskeletal: He exhibits no edema.  Neurological: He is alert and oriented to person, place, and time.  Skin: Skin is warm and dry.  Psychiatric: He has a normal mood and affect.    ASSESSMENT:    1. Coronary artery disease involving native coronary artery of native heart without angina pectoris   2. PVC's (premature ventricular contractions)   3. Essential hypertension   4. History of CVA (cerebrovascular accident)    PLAN:    In order of problems listed above:  1. CAD - He has coronary calcifications noted on prior chest CT.  A Myoview earlier this year demonstrated inferior ischemia. He denies any symptoms of angina.  We will continue medical Rx. Continue current tx which includes Plavix, Alirocumab, diltiazem.   2. PVCs - Holter in 8/17 with 8% PVCs.  Patient is intol of beta blockers due to fatigue.  His EF has been normal in the past.  No further  testing or treatment at this time.  3. HTN - BP controlled on current regimen.  4. Hx of CVA - FU with EP as  planned for monitoring of ILR.    Medication Adjustments/Labs and Tests Ordered: Current medicines are reviewed at length with the patient today.  Concerns regarding medicines are outlined above.  Medication changes, Labs and Tests ordered today are outlined in the Patient Instructions noted below. Patient Instructions  Medication Instructions:  Your physician recommends that you continue on your current medications as directed. Please refer to the Current Medication list given to you today.  Labwork: NONE  Testing/Procedures: NONE  Follow-Up: Ardella Chhim, PAC 1 YEAR; WE WILL SEND OUT A REMINDER LETTER A COUPLE OF MONTHS EARLIER TO HAVE YOU CALL AND MAKE AN APPT  Any Other Special Instructions Will Be Listed Below (If Applicable).  If you need a refill on your cardiac medications before your next appointment, please call your pharmacy.  Signed, Tereso NewcomerScott Kailia Starry, PA-C  07/12/2016 9:44 AM    [facility] [address], [place], [place]  [address] Phone: [phone]; Fax: [phone]

## 2016-07-12 NOTE — Patient Instructions (Addendum)
Medication Instructions:  Your physician recommends that you continue on your current medications as directed. Please refer to the Current Medication list given to you today.  Labwork: NONE  Testing/Procedures: NONE  Follow-Up: SCOTT WEAVER, PAC 1 YEAR; WE WILL SEND OUT A REMINDER LETTER A COUPLE OF MONTHS EARLIER TO HAVE YOU CALL AND MAKE AN APPT  Any Other Special Instructions Will Be Listed Below (If Applicable).  If you need a refill on your cardiac medications before your next appointment, please call your pharmacy.

## 2016-07-18 ENCOUNTER — Other Ambulatory Visit: Payer: Self-pay | Admitting: *Deleted

## 2016-07-18 NOTE — Telephone Encounter (Signed)
Received faxed refill request for Hydrocodone Last refill 06/20/16, last office visit same date See allergy/contraindication Call when ready for pickup

## 2016-07-19 MED ORDER — HYDROCODONE-ACETAMINOPHEN 5-325 MG PO TABS: 1.0000 | ORAL_TABLET | Freq: Four times a day (QID) | ORAL | 0 refills | Status: DC | PRN

## 2016-07-19 NOTE — Telephone Encounter (Signed)
Printed.  Thanks.  

## 2016-07-19 NOTE — Telephone Encounter (Signed)
Wife advised.  Rx left at front desk for pick up.  

## 2016-07-21 ENCOUNTER — Ambulatory Visit (INDEPENDENT_AMBULATORY_CARE_PROVIDER_SITE_OTHER): Payer: Medicare HMO | Admitting: *Deleted

## 2016-07-21 DIAGNOSIS — I63429 Cerebral infarction due to embolism of unspecified anterior cerebral artery: Secondary | ICD-10-CM

## 2016-07-21 NOTE — Progress Notes (Signed)
Carelink Summary Report / Loop Recorder 

## 2016-07-27 ENCOUNTER — Other Ambulatory Visit: Payer: Self-pay | Admitting: Neurology

## 2016-07-27 ENCOUNTER — Other Ambulatory Visit: Payer: Self-pay | Admitting: Family Medicine

## 2016-07-27 DIAGNOSIS — E611 Iron deficiency: Secondary | ICD-10-CM

## 2016-07-27 NOTE — Telephone Encounter (Signed)
Received refill request electronically Last refill 03/25/15 #180/3 refills Last office visit 06/29/16/acute

## 2016-07-27 NOTE — Telephone Encounter (Signed)
Sent. Thanks.   

## 2016-07-30 LAB — CUP PACEART REMOTE DEVICE CHECK
Date Time Interrogation Session: 20171031174302
Implantable Pulse Generator Implant Date: 20170504

## 2016-07-30 NOTE — Progress Notes (Signed)
Carelink summary report received. Battery status OK. Normal device function. No new symptom episodes, tachy episodes, brady, or pause episodes. No new AF episodes. Monthly summary reports and ROV/PRN 

## 2016-08-01 ENCOUNTER — Other Ambulatory Visit (INDEPENDENT_AMBULATORY_CARE_PROVIDER_SITE_OTHER): Payer: Medicare HMO

## 2016-08-01 DIAGNOSIS — D509 Iron deficiency anemia, unspecified: Secondary | ICD-10-CM

## 2016-08-01 DIAGNOSIS — E785 Hyperlipidemia, unspecified: Secondary | ICD-10-CM

## 2016-08-01 LAB — LIPID PANEL
Cholesterol: 123 mg/dL (ref 0–200)
HDL: 54.9 mg/dL (ref >39.00)
LDL Cholesterol: 52 mg/dL (ref 0–99)
NonHDL: 67.75
Total CHOL/HDL Ratio: 2
Triglycerides: 80 mg/dL (ref 0.0–149.0)
VLDL: 16 mg/dL (ref 0.0–40.0)

## 2016-08-01 LAB — IBC PANEL
Iron: 108 ug/dL (ref 42–165)
Saturation Ratios: 22 % (ref 20.0–50.0)
Transferrin: 350 mg/dL (ref 212.0–360.0)

## 2016-08-01 LAB — CBC WITH DIFFERENTIAL/PLATELET
Basophils Absolute: 0.1 10*3/uL (ref 0.0–0.1)
Basophils Relative: 0.7 % (ref 0.0–3.0)
Eosinophils Absolute: 0.3 10*3/uL (ref 0.0–0.7)
Eosinophils Relative: 3.3 % (ref 0.0–5.0)
HCT: 46.5 % (ref 39.0–52.0)
Hemoglobin: 15.6 g/dL (ref 13.0–17.0)
Lymphocytes Relative: 25.7 % (ref 12.0–46.0)
Lymphs Abs: 2 10*3/uL (ref 0.7–4.0)
MCHC: 33.5 g/dL (ref 30.0–36.0)
MCV: 88.8 fl (ref 78.0–100.0)
Monocytes Absolute: 0.8 10*3/uL (ref 0.1–1.0)
Monocytes Relative: 10.3 % (ref 3.0–12.0)
Neutro Abs: 4.7 10*3/uL (ref 1.4–7.7)
Neutrophils Relative %: 60 % (ref 43.0–77.0)
Platelets: 289 10*3/uL (ref 150.0–400.0)
RBC: 5.24 Mil/uL (ref 4.22–5.81)
RDW: 14 % (ref 11.5–15.5)
WBC: 7.9 10*3/uL (ref 4.0–10.5)

## 2016-08-04 NOTE — Addendum Note (Signed)
Addended by: Annamarie MajorFUQUAY, Callista Hoh S on: 08/04/2016 12:04 PM   Modules accepted: Orders

## 2016-08-10 ENCOUNTER — Ambulatory Visit (INDEPENDENT_AMBULATORY_CARE_PROVIDER_SITE_OTHER): Payer: Medicare HMO | Admitting: Family Medicine

## 2016-08-10 ENCOUNTER — Encounter: Payer: Self-pay | Admitting: Family Medicine

## 2016-08-10 VITALS — BP 128/60 | HR 77 | Temp 97.5°F | Ht 69.0 in | Wt 164.5 lb

## 2016-08-10 DIAGNOSIS — M25512 Pain in left shoulder: Secondary | ICD-10-CM

## 2016-08-10 NOTE — Progress Notes (Signed)
Pre visit review using our clinic review tool, if applicable. No additional management support is needed unless otherwise documented below in the visit note. 

## 2016-08-10 NOTE — Progress Notes (Signed)
Dr. Karleen Hampshire T. Werner Labella, MD, CAQ Sports Medicine [facility] [address] [place], [address] Phone: [phone] Fax: [phone]  08/10/2016  Patient: [name], MRN: [id number], DOB: Dec 26, 1925, 82 y.o.  Primary Physician:  [name], MD   Chief Complaint  Patient presents with  . Follow-up    Left Shoulder   Subjective:   [name] is a 82 y.o. very pleasant male patient who presents with the following:  F/u L shoulder:   Here in follow-up on what I had felt was a full-thickness vs partial thickness rtc tear, and he has done remarkably well with conservative care. ROM dramatically improved and strength is much improved. No significant pain at baseline now. 6 weeks after initial eval.   06/29/2016 Last OV with Hannah Beat, MD  Left shoulder pain: Put pressure of a back-hoe.  Now much worse.   Patient has had some pain off and on for a few months in the left shoulder, but he put a tremendous amount of pressure on a back hoe. She'll send, the patient has not been able to abduct or flex his shoulder beyond about 30-40. He is in a significant amount of pain. Internal and external rotation strength has been preserved. No history of fracture or traumatic injury aside from this.  Relatively complicated medical history with a history of stroke, COPD, as well as splenic infarction.  Past Medical History, Surgical History, Social History, Family History, Problem List, Medications, and Allergies have been reviewed and updated if relevant.  Patient Active Problem List   Diagnosis Date Noted  . Pain in joint, shoulder region 03/30/2016  . Lower back pain 03/30/2016  . Tennis elbow 03/30/2016  . Iron deficiency 03/07/2016  . Restless leg syndrome 03/04/2016  . Leg pain 01/28/2016  . Vitamin B 12 deficiency 01/28/2016  . Knee pain 12/06/2015  . Fatigue 12/06/2015  . 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/27/2015  . Palpitations 04/03/2015  . HLD  (hyperlipidemia) 04/03/2015  . Cerebral infarction due to stenosis of cerebral artery (HCC) 04/03/2015  . Stroke (HCC)   . Advance care planning 08/21/2014  . Memory change 08/21/2014  . Ectatic abdominal aorta (HCC) 08/16/2014  . COPD exacerbation (HCC)   . Abdominal pain   . GERD (gastroesophageal reflux disease) 05/29/2014  . Left flank pain, chronic 01/22/2013  . Hypokalemia 12/28/2012  . Angioedema of lips 12/28/2012  . Medicare annual wellness visit, subsequent 08/15/2011  . Angiodysplasia of intestine 11/30/2009  . HEARING LOSS, BILATERAL 07/27/2009  . DYSPHAGIA 08/21/2008  . Barrett's esophagus 05/16/2008  . TEMPOROMANDIBULAR JOINT DISORDER 10/16/2007  . UMBILICAL HERNIA 09/22/2007  . Essential hypertension 05/15/2007  . HEMORRHOIDS 05/15/2007    Past Medical History:  Diagnosis Date  . Back pain    improved with gabapentin as of 2015  . Barrett esophagus 09/2003  . Diverticulosis   . GERD (gastroesophageal reflux disease)   . GI AVM (gastrointestinal arteriovenous vascular malformation)   . Hearing loss   . Hemorrhoids   . Hemorrhoids   . History of Holter monitoring    a. holter 9/17: NSR, PVCs; no sustained arrhythmia; PVC burden 8%  . History of nuclear stress test    a. Myoview 8/17: Intermediate risk, inferior ischemia, not gated  . History of prostatitis   . Hyperlipidemia   . Hypertension   . Macular degeneration, wet (HCC)    receiving intra-ocular injections ([facility])  . PUD (peptic ulcer disease)   . Stroke [facility])  Past Surgical History:  Procedure Laterality Date  . EP IMPLANTABLE DEVICE N/A 12/24/2015   Procedure: Loop Recorder Insertion;  Surgeon: Hillis RangeJames Allred, MD;  [facility]: [facility] INV[facility] LAB;  Service: Cardiovascular;  Laterality: N/A;  . INGUINAL HERNIA REPAIR  2001  . LAPAROSCOPIC APPENDECTOMY N/A 07/07/2014   Procedure: APPENDECTOMY LAPAROSCOPIC;  Surgeon: Manus RuddMatthew Tsuei, MD;  Location: [facility] OR;  Service: General;  Laterality: N/A;  . TEE  WITHOUT CARDIOVERSION N/A 10/28/2015   Procedure: TRANSESOPHAGEAL ECHOCARDIOGRAM (TEE);  Surgeon: Laurey Moralealton S McLean, MD;  [facility];  Service: Cardiovascular;  Laterality: N/A;    Social History   Social History  . Marital status: Married    Spouse name: Marcelina Morelmma Stepter  . Number of children: N/A  . Years of education: N/A   Occupational History  . Retired   .  Retired   Social History Main Topics  . Smoking status: Former Smoker    Packs/day: 0.00    Years: 36.00    Types: Cigarettes  . Smokeless tobacco: Never Used     Comment: Quit in 1983  . Alcohol use No  . Drug use: No  . Sexual activity: Not on file   Other Topics Concern  . Not on file   Social History Narrative   6th grade. Married 1952 1 son, 1 dtr - died at 5 months. Work - Management consultantsaw-mill, Engineer, drillingtractor trailer driver, Catering manageretc.   Retired-fulltime. "[employer]-do's". ACP - yes DNR, yes short-term mechanical ventilarion, no heroic or futile measures in face of irreversible.    Family History  Problem Relation Age of Onset  . Heart attack Father   . Coronary artery disease Father   . Hypertension Mother   . Pancreatic cancer Mother     Died at 6986.  . Cancer Mother     Pancreatic cancer  . Arthritis Sister   . Hyperlipidemia Sister   . Prostate cancer Brother   . Cancer Brother     Bone Cancer secondary to Prostate Cancer  . Bleeding Disorder Son   . Diabetes Neg Hx     Allergies  Allergen Reactions  . Atorvastatin Other (See Comments)    REACTION: muscles tense up   . Codeine Nausea And Vomiting  . Lisinopril Swelling and Other (See Comments)    Lips and face swelling  . Statins Other (See Comments)    Muscle tense up    Medication list reviewed and updated in full in [facility].  GEN: No fevers, chills. Nontoxic. Primarily MSK c/o today. MSK: Detailed in the HPI GI: tolerating PO intake without difficulty Neuro: No numbness, parasthesias, or tingling associated. Otherwise the pertinent positives of  the ROS are noted above.   Objective:   BP 128/60   Pulse 77   Temp 97.5 F (36.4 C) (Oral)   Ht 5\' 9"  (1.753 m)   Wt 164 lb 8 oz (74.6 kg)   BMI 24.29 kg/m    GEN: WDWN, NAD, Non-toxic, Alert & Oriented x 3 HEENT: Atraumatic, Normocephalic.  Ears and Nose: No external deformity. EXTR: No clubbing/cyanosis/edema NEURO: Normal gait.  PSYCH: Normally interactive. Conversant. Not depressed or anxious appearing.  Calm demeanor.    Left shoulder: Nontender along the length of the humerus, clavicle, and AC joint.  Patient is able to abduct his shoulder to proximally 170 actively. Strength 4++/5. Passive range of motion is to 175. Flexion to approximately 180 actively. Strength 5/5.  Internal and external range of motion strength is 5/5.  No pain with  ROM  Some pain with terminal AROM abduction  Radiology: CLINICAL DATA:  Left shoulder pain for 3 weeks.  EXAM: LEFT SHOULDER - 2+ VIEW  COMPARISON:  Chest x-ray March 29, 2015, August 14, 2015, January 28, 2015, February 22, 2016  FINDINGS: There is no evidence of fracture or dislocation. There are minimal degenerative joint changes of the left shoulder. There is a 3.6 mm nodule in the left upper lobe not seen on multiple prior chest x-rays. Soft tissues are unremarkable.  IMPRESSION: No acute fracture dislocation of left shoulder.  Minimal degenerative joint changes of left shoulder.  3.6 mm nodule in the left upper lobe not seen on multiple prior chest x-rays. Further evaluation with chest CT is recommended.   Electronically Signed   By: Sherian Rein M.D.   On: 03/29/2016 15:48  Assessment and Plan:   Acute pain of left shoulder  He looks like a different patient.  L shoulder is dramatically better with ROM and str.  Still suspect at least high grade partial thickness tear. He understands that he will likely have a deficit in abduction str, but he is much improved already.  Cont rehab.  Follow-up:  prn  Signed,  Zyriah Mask T. Kaelynne Christley, MD   Allergies as of 08/10/2016      Reactions   Atorvastatin Other (See Comments)   REACTION: muscles tense up    Codeine Nausea And Vomiting   Lisinopril Swelling, Other (See Comments)   Lips and face swelling   Statins Other (See Comments)   Muscle tense up      Medication List       Accurate as of 08/10/16 11:24 AM. Always use your most recent med list.          ARTIFICIAL TEARS OP Apply 1 drop to eye daily as needed (itchy / watery eyes).   AVASTIN IV Place 1 Applicatorful into both eyes See admin instructions. Right eye every 9 weeks Left eye every 6 weeks.   cetirizine 10 MG tablet Commonly known as:  ZYRTEC Take 10 mg by mouth daily as needed for allergies.   clopidogrel 75 MG tablet Commonly known as:  PLAVIX TAKE 1 TABLET BY MOUTH DAILY   diltiazem 180 MG 24 hr capsule Commonly known as:  CARDIZEM CD TAKE ONE CAPSULE BY MOUTH DAILY   ferrous sulfate 325 (65 FE) MG tablet Take 325 mg by mouth daily with breakfast.   fluticasone 50 MCG/ACT nasal spray Commonly known as:  FLONASE Place 2 sprays into both nostrils daily as needed for allergies.   gabapentin 300 MG capsule Commonly known as:  NEURONTIN TAKE ONE (1) CAPSULE BY MOUTH 2 TIMES DAILY   gemfibrozil 600 MG tablet Commonly known as:  LOPID Take 1 tablet (600 mg total) by mouth 2 (two) times daily.   hydrochlorothiazide 25 MG tablet Commonly known as:  HYDRODIURIL Take 1 tablet (25 mg total) by mouth daily.   HYDROcodone-acetaminophen 5-325 MG tablet Commonly known as:  NORCO/VICODIN Take 1 tablet by mouth every 6 (six) hours as needed for moderate pain (sedation caution).   loperamide 2 MG capsule Commonly known as:  IMODIUM Take 1 capsule (2 mg total) by mouth 3 (three) times daily as needed for diarrhea or loose stools.   meclizine 25 MG tablet Commonly known as:  ANTIVERT Take 0.5 tablets (12.5 mg total) by mouth 3 (three) times daily as needed  for dizziness.   nitroGLYCERIN 0.4 MG SL tablet Commonly known as:  NITROSTAT Place 1 tablet (  0.4 mg total) under the tongue every 5 (five) minutes as needed for chest pain (max 3 doses in 15 minutes).   OCUVITE ADULT 50+ PO Take 1 capsule by mouth daily.   pantoprazole 40 MG tablet Commonly known as:  PROTONIX TAKE ONE TABLET BY MOUTH EACH MORNING   PRALUENT 150 MG/ML Sopn Generic drug:  Alirocumab INJECT 150MG  (CONTENTS OF ONE SYRINGE) INTO THE SKIN ONCE EVERY 14 DAYS.   ranitidine 150 MG tablet Commonly known as:  ZANTAC TAKE 1 TABLET BY MOUTH TWICE A DAY   rOPINIRole 1 MG tablet Commonly known as:  REQUIP TAKE ONE TABLET BY MOUTH EVERY NIGHT AT BEDTIME   sucralfate 1 g tablet Commonly known as:  CARAFATE TAKE 1 TABLET BY MOUTH BEFORE LAYING DOWN FOR NAP ORAS NEEDED   tamsulosin 0.4 MG Caps capsule Commonly known as:  FLOMAX Take 0.4 mg by mouth daily.   traMADol 50 MG tablet Commonly known as:  ULTRAM Take 1 tablet (50 mg total) by mouth every 8 (eight) hours as needed for moderate pain or severe pain.   vitamin B-12 1000 MCG tablet Commonly known as:  CYANOCOBALAMIN Take 2 tablets (2,000 mcg total) by mouth daily.

## 2016-08-13 ENCOUNTER — Other Ambulatory Visit: Payer: Self-pay | Admitting: Family Medicine

## 2016-08-16 NOTE — Telephone Encounter (Signed)
Electronic refill request. Last Filled:    30 tablet 0 07/19/2016  Last office visit:   08/10/16 [place].  Please advise.

## 2016-08-18 NOTE — Telephone Encounter (Signed)
Printed.  Thanks.  

## 2016-08-18 NOTE — Telephone Encounter (Signed)
No answer at only phone number listed.  Rx left at front desk for pick up.

## 2016-08-23 ENCOUNTER — Ambulatory Visit (INDEPENDENT_AMBULATORY_CARE_PROVIDER_SITE_OTHER): Payer: PPO | Admitting: *Deleted

## 2016-08-23 DIAGNOSIS — Z8673 Personal history of transient ischemic attack (TIA), and cerebral infarction without residual deficits: Secondary | ICD-10-CM

## 2016-08-24 DIAGNOSIS — M25512 Pain in left shoulder: Secondary | ICD-10-CM | POA: Diagnosis not present

## 2016-08-24 DIAGNOSIS — M25612 Stiffness of left shoulder, not elsewhere classified: Secondary | ICD-10-CM | POA: Diagnosis not present

## 2016-08-24 DIAGNOSIS — M6281 Muscle weakness (generalized): Secondary | ICD-10-CM | POA: Diagnosis not present

## 2016-08-24 NOTE — Progress Notes (Signed)
Carelink Summary Report 

## 2016-08-25 ENCOUNTER — Other Ambulatory Visit: Payer: Self-pay | Admitting: Family Medicine

## 2016-08-26 DIAGNOSIS — M25612 Stiffness of left shoulder, not elsewhere classified: Secondary | ICD-10-CM | POA: Diagnosis not present

## 2016-08-26 DIAGNOSIS — M25512 Pain in left shoulder: Secondary | ICD-10-CM | POA: Diagnosis not present

## 2016-08-26 DIAGNOSIS — M6281 Muscle weakness (generalized): Secondary | ICD-10-CM | POA: Diagnosis not present

## 2016-08-31 LAB — CUP PACEART REMOTE DEVICE CHECK
Date Time Interrogation Session: 20171130184032
Implantable Pulse Generator Implant Date: 20170504

## 2016-09-01 ENCOUNTER — Encounter: Payer: Self-pay | Admitting: Family Medicine

## 2016-09-01 ENCOUNTER — Ambulatory Visit (INDEPENDENT_AMBULATORY_CARE_PROVIDER_SITE_OTHER): Payer: PPO | Admitting: Family Medicine

## 2016-09-01 VITALS — BP 110/60 | HR 78 | Temp 97.8°F | Ht 69.0 in | Wt 166.5 lb

## 2016-09-01 DIAGNOSIS — R413 Other amnesia: Secondary | ICD-10-CM

## 2016-09-01 DIAGNOSIS — E611 Iron deficiency: Secondary | ICD-10-CM | POA: Diagnosis not present

## 2016-09-01 DIAGNOSIS — K2270 Barrett's esophagus without dysplasia: Secondary | ICD-10-CM | POA: Diagnosis not present

## 2016-09-01 DIAGNOSIS — K5520 Angiodysplasia of colon without hemorrhage: Secondary | ICD-10-CM

## 2016-09-01 DIAGNOSIS — Z0000 Encounter for general adult medical examination without abnormal findings: Secondary | ICD-10-CM | POA: Diagnosis not present

## 2016-09-01 DIAGNOSIS — M25512 Pain in left shoulder: Secondary | ICD-10-CM

## 2016-09-01 DIAGNOSIS — E785 Hyperlipidemia, unspecified: Secondary | ICD-10-CM

## 2016-09-01 DIAGNOSIS — G2581 Restless legs syndrome: Secondary | ICD-10-CM

## 2016-09-01 NOTE — Assessment & Plan Note (Signed)
Continue PPI.  No ADE on med.

## 2016-09-01 NOTE — Progress Notes (Signed)
I have personally reviewed the Medicare Annual Wellness questionnaire and have noted 1. The patient's medical and social history 2. Their use of alcohol, tobacco or illicit drugs 3. Their current medications and supplements 4. The patient's functional ability including ADL's, fall risks, home safety risks and hearing or visual             impairment. 5. Diet and physical activities 6. Evidence for depression or mood disorders  The patients weight, height, BMI have been recorded in the chart and visual acuity is per eye clinic.  I have made referrals, counseling and provided education to the patient based review of the above and I have provided the pt with a written personalized care plan for preventive services.  Provider list updated- see scanned forms.  Routine anticipatory guidance given to patient.  See health maintenance.  Flu 2017 Shingles UTD PNA UTD Tetanus 2011 Colon CA screening deferred for now.  D/w pt.   Prostate cancer screening deferred for now due to age.  D/w pt.   Advance directive- wife designated if patient were incapacitated.   Cognitive function addressed- see scanned forms- and if abnormal then additional documentation follows.   H/o low iron, improved on last set of labs.  D/w pt.  H/o AVM.  Rarely seeing occ blood with BM, he presumed to be from hemorrhoid. Still on iron once a day.  D/w pt.  Likely safer to monitor and treat with iron unless he had sig sx.    RLS.  No ADE on requip. Clearly improved on med. Compliant.  Sleeps better with med.   Elevated Cholesterol: Using medications without problems:yes Muscle aches: no Diet compliance:yes Exercise:yes Labs d/w pt.    H/o Barrett's on PPI.  No ADE on med.  Compliant.   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  ROM clearly improved.  Has full ROM back now. Taking tramadol as needed w/o ADE.    PMH and SH  reviewed  Meds, vitals, and allergies reviewed.   ROS: Per HPI.  Unless specifically indicated otherwise in HPI, the patient denies:  General: fever. Eyes: acute vision changes ENT: sore throat Cardiovascular: chest pain Respiratory: SOB GI: vomiting GU: dysuria Musculoskeletal: acute back pain Derm: acute rash Neuro: acute motor dysfunction Psych: worsening mood Endocrine: polydipsia Heme: bleeding Allergy: hayfever  GEN: nad, alert and oriented HEENT: mucous membranes moist NECK: supple w/o LA CV: rrr. PULM: ctab, no inc wob ABD: soft, +bs EXT: no edema SKIN: no acute rash Normal ROM L shoulder

## 2016-09-01 NOTE — Assessment & Plan Note (Signed)
Controlled, continue as is.  

## 2016-09-01 NOTE — Assessment & Plan Note (Signed)
Much improved, continue as is.  See above.  Taking tramadol vs hydrocodone as needed, no ADE, pt limiting use.

## 2016-09-01 NOTE — Patient Instructions (Signed)
Don't change your meds for now.  Recheck in about 6 months.  We can go labs ahead of the visit.   Take care.  Glad to see you.  Update me as needed.

## 2016-09-01 NOTE — Assessment & Plan Note (Signed)
Is improved on last set of labs.  D/w pt.  H/o AVM.  Rarely seeing occ blood with BM, he presumed to be from hemorrhoid. Still on iron once a day.  D/w pt.  Likely safer to monitor and treat with iron unless he had sig sx.

## 2016-09-01 NOTE — Assessment & Plan Note (Signed)
No ADE on requip. Clearly improved on med.

## 2016-09-01 NOTE — Progress Notes (Signed)
Pre visit review using our clinic review tool, if applicable. No additional management support is needed unless otherwise documented below in the visit note. 

## 2016-09-01 NOTE — Assessment & Plan Note (Signed)
Flu 2017 Shingles UTD PNA UTD Tetanus 2011 Colon CA screening deferred for now.  D/w pt.   Prostate cancer screening deferred for now due to age.  D/w pt.   Advance directive- wife designated if patient were incapacitated.   Cognitive function addressed- see scanned forms- and if abnormal then additional documentation follows.

## 2016-09-01 NOTE — Assessment & Plan Note (Signed)
HGB and iron improved on last set of labs.  D/w pt.  H/o AVM.  Rarely seeing occ blood with BM, he presumed to be from hemorrhoid. Still on iron once a day.  D/w pt.  Likely safer to monitor and treat with iron unless he had sig sx.   He agrees.

## 2016-09-01 NOTE — Assessment & Plan Note (Signed)
No red flag events, we can monitor periodically.  He is clearly still functional.

## 2016-09-06 DIAGNOSIS — H353221 Exudative age-related macular degeneration, left eye, with active choroidal neovascularization: Secondary | ICD-10-CM | POA: Diagnosis not present

## 2016-09-06 DIAGNOSIS — H353211 Exudative age-related macular degeneration, right eye, with active choroidal neovascularization: Secondary | ICD-10-CM | POA: Diagnosis not present

## 2016-09-12 DIAGNOSIS — H02102 Unspecified ectropion of right lower eyelid: Secondary | ICD-10-CM | POA: Diagnosis not present

## 2016-09-12 DIAGNOSIS — H353231 Exudative age-related macular degeneration, bilateral, with active choroidal neovascularization: Secondary | ICD-10-CM | POA: Diagnosis not present

## 2016-09-12 DIAGNOSIS — H02105 Unspecified ectropion of left lower eyelid: Secondary | ICD-10-CM | POA: Diagnosis not present

## 2016-09-12 DIAGNOSIS — H52203 Unspecified astigmatism, bilateral: Secondary | ICD-10-CM | POA: Diagnosis not present

## 2016-09-13 DIAGNOSIS — H353211 Exudative age-related macular degeneration, right eye, with active choroidal neovascularization: Secondary | ICD-10-CM | POA: Diagnosis not present

## 2016-09-13 DIAGNOSIS — H353221 Exudative age-related macular degeneration, left eye, with active choroidal neovascularization: Secondary | ICD-10-CM | POA: Diagnosis not present

## 2016-09-19 ENCOUNTER — Ambulatory Visit (INDEPENDENT_AMBULATORY_CARE_PROVIDER_SITE_OTHER): Payer: PPO | Admitting: *Deleted

## 2016-09-19 DIAGNOSIS — I63429 Cerebral infarction due to embolism of unspecified anterior cerebral artery: Secondary | ICD-10-CM

## 2016-09-20 NOTE — Progress Notes (Signed)
Carelink Summary Report / Loop Recorder 

## 2016-09-24 ENCOUNTER — Other Ambulatory Visit: Payer: Self-pay | Admitting: Family Medicine

## 2016-09-27 ENCOUNTER — Telehealth: Payer: Self-pay

## 2016-09-27 NOTE — Telephone Encounter (Signed)
Mrs [name] left v/m request prior auth for Praluent. Mrs [name] request cb when done.

## 2016-09-28 NOTE — Telephone Encounter (Signed)
Can you work on this prior authorization this afternoon?

## 2016-09-28 NOTE — Telephone Encounter (Signed)
Possibly. It may be tomorrow.

## 2016-09-29 ENCOUNTER — Other Ambulatory Visit: Payer: Self-pay | Admitting: Family Medicine

## 2016-09-29 NOTE — Telephone Encounter (Signed)
Called the Pharmacy Help Desk to do PA. Was on hold for 20 minutes before having to hang up. Will try again this afternoon or tomorrow.

## 2016-09-30 NOTE — Telephone Encounter (Signed)
Spoke to [employer]. Did PA over the phone. Will received answer in 72 business hours

## 2016-10-06 NOTE — Telephone Encounter (Signed)
Pt and his wife brought by paperwork for denial of Praluent. Pt has a Praluent injection for 10/12/16.pt will not have injection for the next shot in 2 weeks.  There is a # on form for expedited request can call [phone].  Dr Para Marchuncan said to put form in his in box on his desk. Pt will wait to hear from results of appeal.

## 2016-10-07 NOTE — Telephone Encounter (Signed)
I will take a look at this and work on this. Thanks.

## 2016-10-08 LAB — CUP PACEART REMOTE DEVICE CHECK
Date Time Interrogation Session: 20171230190658
Implantable Pulse Generator Implant Date: 20170504

## 2016-10-08 NOTE — Progress Notes (Signed)
Carelink summary report received. Battery status OK. Normal device function. No new symptom episodes, tachy episodes, brady, or pause episodes. No new AF episodes. Monthly summary reports and ROV/PRN 

## 2016-10-18 LAB — CUP PACEART REMOTE DEVICE CHECK
Date Time Interrogation Session: 20180129222901
Implantable Pulse Generator Implant Date: 20170504

## 2016-10-18 NOTE — Telephone Encounter (Signed)
Mrs [name] left v/m that she was talking with ins co and was advised for pt to get praluent he would need to see cholesterol specialist. Mrs [name] request cb.

## 2016-10-18 NOTE — Telephone Encounter (Signed)
Patient's wife notified as instructed by telephone and verbalized understanding. 

## 2016-10-18 NOTE — Telephone Encounter (Signed)
Please call pt.  1. I called about the appeal.  I am technically a specialist, in that I am board certified in a speciality (family medicine). The patient has been on med, with effect, and can't tolerate mult statins.  He needs to continue the med, they are going to process the appeal.  2. Tell him I said happy birthday.  Thanks.

## 2016-10-19 ENCOUNTER — Ambulatory Visit (INDEPENDENT_AMBULATORY_CARE_PROVIDER_SITE_OTHER): Payer: PPO | Admitting: *Deleted

## 2016-10-19 DIAGNOSIS — I63429 Cerebral infarction due to embolism of unspecified anterior cerebral artery: Secondary | ICD-10-CM

## 2016-10-20 NOTE — Progress Notes (Signed)
Carelink Summary Report / Loop Recorder 

## 2016-10-26 ENCOUNTER — Telehealth: Payer: Self-pay | Admitting: Internal Medicine

## 2016-10-26 NOTE — Telephone Encounter (Signed)
New message    Pt wife is calling about the transmitter at their home. She has a question about the lights on it.

## 2016-10-26 NOTE — Telephone Encounter (Signed)
Spoke w/ pt wife and informed her that pt home monitor is working. Pt wife verbalized understanding.

## 2016-10-31 ENCOUNTER — Other Ambulatory Visit: Payer: Self-pay | Admitting: Family Medicine

## 2016-10-31 NOTE — Telephone Encounter (Signed)
Received refill electronically Last refill 09/29/16 #30 Last office visit 09/01/16

## 2016-11-02 NOTE — Telephone Encounter (Signed)
Sent. Thanks.   

## 2016-11-03 DIAGNOSIS — H353211 Exudative age-related macular degeneration, right eye, with active choroidal neovascularization: Secondary | ICD-10-CM | POA: Diagnosis not present

## 2016-11-03 DIAGNOSIS — H353221 Exudative age-related macular degeneration, left eye, with active choroidal neovascularization: Secondary | ICD-10-CM | POA: Diagnosis not present

## 2016-11-04 LAB — CUP PACEART REMOTE DEVICE CHECK
Date Time Interrogation Session: 20180228193718
Implantable Pulse Generator Implant Date: 20170504

## 2016-11-07 ENCOUNTER — Encounter: Payer: Self-pay | Admitting: Neurology

## 2016-11-07 ENCOUNTER — Other Ambulatory Visit: Payer: Self-pay | Admitting: Family Medicine

## 2016-11-07 ENCOUNTER — Ambulatory Visit (INDEPENDENT_AMBULATORY_CARE_PROVIDER_SITE_OTHER): Payer: PPO | Admitting: Neurology

## 2016-11-07 VITALS — BP 125/72 | HR 76 | Wt 169.4 lb

## 2016-11-07 DIAGNOSIS — D735 Infarction of spleen: Secondary | ICD-10-CM | POA: Diagnosis not present

## 2016-11-07 DIAGNOSIS — I10 Essential (primary) hypertension: Secondary | ICD-10-CM

## 2016-11-07 DIAGNOSIS — E611 Iron deficiency: Secondary | ICD-10-CM

## 2016-11-07 DIAGNOSIS — G2581 Restless legs syndrome: Secondary | ICD-10-CM

## 2016-11-07 DIAGNOSIS — I6350 Cerebral infarction due to unspecified occlusion or stenosis of unspecified cerebral artery: Secondary | ICD-10-CM | POA: Diagnosis not present

## 2016-11-07 DIAGNOSIS — E782 Mixed hyperlipidemia: Secondary | ICD-10-CM | POA: Diagnosis not present

## 2016-11-07 NOTE — Patient Instructions (Signed)
-   continue plavix and priluent for stroke prevention - continue loop recorder monitoring - follow up with cardiology - continue requip for RLS. - Follow up with your primary care physician for stroke risk factor modification. Recommend maintain blood pressure goal <130/80, diabetes with hemoglobin A1c goal below 7.0% and lipids with LDL cholesterol goal below 70 mg/dL.  - check BP at home and record. - follow up as needed.

## 2016-11-07 NOTE — Telephone Encounter (Signed)
Received refill electronically Last refill 06/26/16 #30/1 Last office visit 09/01/16

## 2016-11-07 NOTE — Progress Notes (Signed)
STROKE NEUROLOGY FOLLOW UP NOTE  NAME: [name] DOB: 1925/11/02  REASON FOR VISIT: stroke follow up HISTORY FROM: pt and chart  Today we had the pleasure of seeing [name] in follow-up at our Neurology [facility]. Pt was accompanied by wife.   History Summary [name] is a 83 y.o. male with history of hypertension, hyperlipidemia, COPD, diverticulitis, and von Willebrand's disease was admitted on 01/28/15 for dizziness and gait instability. MRI showed acute left frontal MCA/ACA punctate infarcts, as well as chronic right CR and left cerebellum. MRA head and CTA neck showed diffuse intracranial athero and left VA occlusion. Stroke work up showed normal 2D echo, but LDL 176. He was started on dural antiplatelet and recommend outpt PCSK9 inhibitors as he is not tolerating statins.  He was discharged home with home health.  However, he was admitted again on 03/29/15 for dizziness, lightheadedness and gait instability, almost same as last time. MRI showed new acute infarct at right frontal cortex and subcortical WM. LDL down to 84. Stroke etiology still consistent with diffuse athero and ? Dehydration. He was continued on dural antiplatelet.   04/03/15 follow up - the patient has been doing well. No more stroke like symptoms since last discharge. He stated that he is compliant with medications and no missing does. No palpitation of heart. Had occasions that after bowel movement, had small amount of blood on tissue paper when wiping out buttock. Complaining of hard stool but denies dehydration. BP in clinic was high at 173/78.   06/08/15 follow up - pt has been doing well. No recurrent symptoms. He had 30 day cardiac event monitoring did not show afib. He is continued on ASA and plavix. He has been on pralucent for LDL control. His BP today 148/67.   12/07/15 follow up - pt has been doing well. Stated to have b/l knee pain and difficulty walking, requesting for disability parking decal. Told  him that his PCP can help him on this issue. On ASA and plavix, and also on praluent for HLD. On 10/27/15 he was admitted for abdominal pain, found to have splenic infarct. CT abd/pelvis with contrast showed a 29 mm in diameter abdominal aortic aneurysm, and extensive soft and calcified atherosclerotic plaque throughout the aorta. TEE done showed grade IV plaque in the descending thoracic aorta. His splenic infarct was considered to be related to atherosclerosis of aorta and its branches. Pt has seen Dr. Cyndie Chime for von Willebrand's disorder and considered mild type I if he has VMD. He was put back on DAPT until end of May. BP 125/66 today in clinic.   03/04/16 follow up - pt has been doing well without stroke symptoms. Loop recorder placed in May this year and so far no afib found. Has stopped ASA as scheduled and currently on plavix. Has followed with PCP and orthopedics for knee pain but was told no abnormalities found.  Of note, he seems restless during the visit, keep moving while sitting in chair. By detailed history asking, he stated that if he works in the yard, he has no problems and no knee pain or discomfort. Once he gets inside room and sitting in chair, he has to move his legs otherwise his knees bother him a lot. Moving seems get things better. At night, some days he is OK but other days he can not get into sleep because he has to move his legs. Also noticed that his CBC showed microcytic anemia.  05/10/16 follow  up - pt has been doing well. Found to have low iron and ferritin levels which are consistent with his RLS. He was put on iron supplement. He stated that after taking requip and iron pills, his symptoms of leg movement at night much improved. So far he complains of back pain and shoulder pain but they are likely due to arthritis. He also followed with cardiology for PVCs and CAD, continued on plavix and praluent and cardizem. His BP at home was high at 160-170, but today in clinic 140/67.     Interval History During the interval time, pt has been doing well. Follows with PCP and checked lipid panel and iron level as well as Hb all improved nicely. Has been discharged from hematology clinic. Follows with cardiology regularly. He stated that his RLS stable and improved from before. Still on requip and gabapentin. BP stable at home, today in clinic 125/72. Continued on plavix and praluent. Loop recording no afib.   REVIEW OF SYSTEMS: Full 14 system review of systems performed and notable only for those listed below and in HPI above, all others are negative:  Constitutional:   Cardiovascular:  Ear/Nose/Throat:  Skin:  Eyes:   Respiratory:   Gastroitestinal:  Genitourinary:  Hematology/Lymphatic:   Endocrine: cold intolerance  Musculoskeletal:  Joint pain, back pain Allergy/Immunology:   Neurological:   Psychiatric:  Sleep: snoring, restless leg  The following represents the patient's updated allergies and side effects list: Allergies  Allergen Reactions  . Atorvastatin Other (See Comments)    REACTION: muscles tense up   . Codeine Nausea And Vomiting  . Lisinopril Swelling and Other (See Comments)    Lips and face swelling  . Statins Other (See Comments)    Muscle tense up    The neurologically relevant items on the patient's problem list were reviewed on today's visit.  Neurologic Examination  A problem focused neurological exam (12 or more points of the single system neurologic examination, vital signs counts as 1 point, cranial nerves count for 8 points) was performed.  Blood pressure 125/72, pulse 76, weight 169 lb 6.4 oz (76.8 kg).  General - Well nourished, well developed, in no apparent distress.  Ophthalmologic - Sharp disc margins OU. Fundi not visualized due to .  Cardiovascular - Regular rate and rhythm with no murmur.  Mental Status -  Level of arousal and orientation to time, place, and person were intact. Language including expression, naming,  repetition, comprehension was assessed and found intact. Fund of Knowledge was assessed and was intact.  Cranial Nerves II - XII - II - Visual field intact OU. III, IV, VI - Extraocular movements intact. V - Facial sensation intact bilaterally. VII - Facial movement intact bilaterally. VIII - Hard of hearing on hearing aids & vestibular intact bilaterally. X - Palate elevates symmetrically. XI - Chin turning & shoulder shrug intact bilaterally. XII - Tongue protrusion intact.  Motor Strength - The patient's strength was normal in all extremities and pronator drift was absent.  Bulk was normal and fasciculations were absent.   Motor Tone - Muscle tone was assessed at the neck and appendages and was normal.  Reflexes - The patient's reflexes were 1+ in all extremities and he had no pathological reflexes.  Sensory - Light touch, temperature/pinprick were assessed and were normal.    Coordination - The patient had normal movements in the hands and feet with no ataxia or dysmetria.  Tremor was absent.  Gait and Station - mild stooped posturing.  Data reviewed: I personally reviewed the images and agree with the radiology interpretations.  Dg Chest 2 View 01/29/2015 No active cardiopulmonary disease.   Mr Brain Wo Contrast 01/28/2015 1. Punctate nonhemorrhagic white matter infarct deep to the right post central gyrus. A second punctate adjacent white matter infarct may be present as well. 2. Moderate generalized white matter disease bilaterally suggesting chronic microvascular ischemia. 3. Remote lacunar infarcts within the right coronal radiata and left cerebellum.   Mr Maxine Glenn Head/brain Wo Cm 01/29/2015 LEFT posterior-inferior cerebellar artery predominately terminates in PICA, with occluded LEFT V4 segment. Moderate to severe stenosis of RIGHT P2, moderate stenosis proximal LEFT P3. Moderate stenosis LEFT M2 segment origin.   MRA 03/29/15 - 1. No acute arterial finding or change  from 01/29/2015. 2. Chronic left V4 segment occlusion with patent early branching left PICA. 3. Chronic bilateral PCA distribution moderate to advanced stenoses. 4. 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  MRI 03/29/15 New nonhemorrhagic tubular shaped area of restricted diffusion RIGHT frontal cortex and subcortical white matter, consistent with an acute perforating vessel infarct, RIGHT MCA territory. Normalization of the previous areas of acute infarctions demonstrated in June. Generalized severe atrophy and extensive small vessel disease. Moderately extensive LEFT middle ear and mastoid fluid is asymmetric and persistent. Correlate clinically for otomastoiditis.  Ct Angio Neck W/cm &/or Wo/cm 01/29/2015 Moderate narrowing origin of the left vertebral artery. Narrowing left vertebral artery at the C1-2 level. Occluded left vertebral artery at the level the foramen magnum (V4). Right vertebral artery is dominant with mild irregularity without high-grade stenosis. Plaque carotid bifurcation bilaterally with less than 50% diameter stenosis. Mild to slightly moderate narrowing proximal left common carotid artery. Cervical spondylotic changes most notable C4-5 thru C6-7 level. Complete opacification inferior left mastoid air cells. No obstructing lesion of the eustachian tube is noted. Fluid filled upper cervical esophagus. Right hilar adenopathy.   2D echo  - Left ventricle: The cavity size was normal. Wall thickness wasincreased in a pattern of mild LVH. The estimated ejectionfraction was 55%. Wall motion was normal; there were no regionalwall motion abnormalities. - Right ventricle: The cavity size was mildly dilated. Systolicfunction was mildly reduced. - Pulmonary arteries: PA peak pressure: 37 mm Hg (S). Impressions: No cardiac source of embolism was identified, butcannot be ruled out on the basis of this examination.  30 day cardiac monitoring - no afib  TEE 10/28/15  -  Left ventricle: The cavity size was normal. Wall thickness was  normal. The estimated ejection fraction was 55%. Wall motion was  normal; there were no regional wall motion abnormalities. - Aortic valve: There was no stenosis. - Aorta: There was grade IV plaque in the descending thoracic  aorta. - Mitral valve: There was trivial regurgitation. - Left atrium: No evidence of thrombus in the atrial cavity or  appendage. - Right ventricle: The cavity size was normal. Systolic function  was normal. - Atrial septum: No defect or patent foramen ovale was identified.  Echo contrast study showed no right-to-left atrial level shunt,  at baseline or with provocation. Impressions: - Only possible source of embolus noted for splenic infarct would  be extensive descending thoracic aorta plaque.  CT abd/pel with contrast  08/14/15 1. Fluid throughout the colon compatible with diarrhea, but otherwise no acute or inflammatory process in the abdomen or pelvis. 2. Extensive soft and calcified atherosclerosis in the aorta, its branches, and the pelvic arteries. No major arterial occlusion identified. Stable mild infrarenal abdominal aortic aneurysm, 29  mm. Recommend followup by Ultrasound in 5 years.  10/27/15 Splenic infarct. Small left inguinal hernia containing fat. Scattered diverticula in the colon without evidence of diverticulitis. Moderate-sized esophageal hiatal hernia.  Component     Latest Ref Rng 03/30/2015 04/20/2015 06/12/2015 08/19/2015  Cholesterol     0 - 200 mg/dL 161 096 045 409  Triglycerides     0.0 - 149.0 mg/dL 90 811.9 147.8 295.6 (H)  HDL Cholesterol     >39.00 mg/dL 37 (L) 21.30 86.57 84.69  VLDL     0.0 - 40.0 mg/dL 18 62.9 52.8 41.3  LDL (calc)     0 - 99 mg/dL 84 74 83 91  Total CHOL/HDL Ratio      3.8 3 3 3   NonHDL       94.85 106.09 122.09  PTT Lupus Anticoagulant     0.0 - 43.6 sec      DRVVT     0.0 - 44.0 sec      Lupus Anticoag Interp             Beta-2 Glycoprotein I Ab, IgG     0 - 20 GPI IgG units      Beta-2-Glycoprotein I IgM     0 - 32 GPI IgM units      Beta-2-Glycoprotein I IgA     0 - 25 GPI IgA units      Anticardiolipin Ab,IgG,Qn     0 - 14 GPL U/mL      Anticardiolipin Ab,IgM,Qn     0 - 12 MPL U/mL      Anticardiolipin Ab,IgA,Qn     0 - 11 APL U/mL      Coagulation Factor VIII     57 - 163 %      Ristocetin Co-factor, Plasma     50 - 200 %      Von Willebrand Antigen, Plasma     50 - 200 %      Hemoglobin A1C     4.8 - 5.6 % 6.0 (H)     Mean Plasma Glucose      126     Recommendations-F5LEID:           Comment           Recommendations-PTGENE:           Additional Information           Interpretation           PDF Image           Antithrombin Activity     75 - 120 %      Protein C-Functional     73 - 180 %      Protein C, Total     60 - 150 %      Protein S-Functional     63 - 140 %      Protein S, Total     60 - 150 %      Homocysteine     0.0 - 15.0 umol/L       Component     Latest Ref Rng 10/27/2015  Cholesterol     0 - 200 mg/dL   Triglycerides     0.0 - 149.0 mg/dL   HDL Cholesterol     >24.40 mg/dL   VLDL     0.0 - 10.2 mg/dL   LDL (calc)     0 - 99 mg/dL   Total CHOL/HDL Ratio  NonHDL        PTT Lupus Anticoagulant     0.0 - 43.6 sec 39.4  DRVVT     0.0 - 44.0 sec 43.8  Lupus Anticoag Interp      Comment:  Beta-2 Glycoprotein I Ab, IgG     0 - 20 GPI IgG units <9  Beta-2-Glycoprotein I IgM     0 - 32 GPI IgM units <9  Beta-2-Glycoprotein I IgA     0 - 25 GPI IgA units <9  Anticardiolipin Ab,IgG,Qn     0 - 14 GPL U/mL <9  Anticardiolipin Ab,IgM,Qn     0 - 12 MPL U/mL <9  Anticardiolipin Ab,IgA,Qn     0 - 11 APL U/mL <9  Coagulation Factor VIII     57 - 163 % 273 (H)  Ristocetin Co-factor, Plasma     50 - 200 % 145  Von Willebrand Antigen, Plasma     50 - 200 % 172  Hemoglobin A1C     4.8 - 5.6 %   Mean Plasma Glucose         Recommendations-F5LEID:      Comment (A)  Comment      Comment  Recommendations-PTGENE:      Comment  Additional Information      Comment  Interpretation      Note  PDF Image      .  Antithrombin Activity     75 - 120 % 137 (H)  Protein C-Functional     73 - 180 % 127  Protein C, Total     60 - 150 % 89  Protein S-Functional     63 - 140 % 84  Protein S, Total     60 - 150 % 121  Homocysteine     0.0 - 15.0 umol/L 17.1 (H)   Component     Latest Ref Rng 01/27/2016 02/25/2016  TSH     0.35 - 4.50 uIU/mL 2.59   Vitamin B12     211 - 911 pg/mL 193 (L) 470  Sed Rate     0 - 20 mm/hr 38 (H)    Component     Latest Ref Rng & Units 02/25/2016 03/04/2016  TIBC     250 - 450 ug/dL  161 (H)  UIBC     096 - 343 ug/dL  045 (H)  Iron     38 - 169 ug/dL  17 (L)  Iron Saturation     15 - 55 %  3 (LL)  Vitamin B12     211 - 911 pg/mL 470   Ferritin     30 - 400 ng/mL  13 (L)   Component     Latest Ref Rng & Units 05/12/2016 08/01/2016  WBC     4.0 - 10.5 K/uL  7.9  RBC     4.22 - 5.81 Mil/uL  5.24  Hemoglobin     13.0 - 17.0 g/dL  40.9  HCT     81.1 - 91.4 %  46.5  MCV     78.0 - 100.0 fl  88.8  MCHC     30.0 - 36.0 g/dL  78.2  RDW     95.6 - 21.3 %  14.0  Platelets     150.0 - 400.0 K/uL  289.0  Neutrophils     43.0 - 77.0 %  60.0  Lymphocytes     12.0 - 46.0 %  25.7  Monocytes Relative  3.0 - 12.0 %  10.3  Eosinophil     0.0 - 5.0 %  3.3  Basophil     0.0 - 3.0 %  0.7  NEUT#     1.4 - 7.7 K/uL  4.7  Lymphocyte #     0.7 - 4.0 K/uL  2.0  Monocyte #     0.1 - 1.0 K/uL  0.8  Eosinophils Absolute     0.0 - 0.7 K/uL  0.3  Basophils Absolute     0.0 - 0.1 K/uL  0.1  Cholesterol     0 - 200 mg/dL  161  Triglycerides     0.0 - 149.0 mg/dL  09.6  HDL Cholesterol     >39.00 mg/dL  04.54  VLDL     0.0 - 40.0 mg/dL  09.8  LDL (calc)     0 - 99 mg/dL  52  Total CHOL/HDL Ratio       2  NonHDL       67.75  Factor VIII Activity     57 - 163 % 195  (H)   von Willebrand Factor (vWF) Ag     50 - 200 % 160   vWF Activity     50 - 200 % 135   Iron     42 - 165 ug/dL  119  Transferrin     147.8 - 360.0 mg/dL  295.6  Saturation Ratios     20.0 - 50.0 %  22.0  Interpretation      Note     Assessment: As you may recall, he is a 83 y.o. Caucasian male with PMH of hypertension, hyperlipidemia, COPD, diverticulitis, and von Willebrand's disease was admitted on 01/28/15 for dizziness and gait instability. MRI showed acute left frontal MCA/ACA punctate infarcts, as well as chronic right CR and left cerebellum. MRA head and CTA neck showed diffuse intracranial athero and left VA occlusion. Stroke work up showed normal 2D echo, but LDL 176. He was started on dural antiplatelet and recommend outpt PCSK9 inhibitors as he is not tolerating statins.  He was discharged home with home health. However, he was admitted again on 03/29/15 for similar symptoms. MRI showed new acute infarct at right frontal cortex and subcortical WM. LDL down to 84. He was continued on dural antiplatelet and started on praluent. 30 day cardiac event monitoring negative. After finished DAPT course, he was continued on plavix. 10/2015 he was admitted for splenic infarct, CT abd/pelvis with contrast showed a 29 mm AAA, and extensive soft and calcified atherosclerotic plaque throughout the aorta. TEE done showed grade IV plaque in the descending thoracic aorta. His splenic infarct was considered to be related to atherosclerosis of aorta and its branches. Pt has seen Dr. Cyndie Chime for von Willebrand's disorder and considered possible mild type I. He was put back on DAPT until end of May when his ASA was stopped. He is now on plavix alone. Due to the recurrent strokes and splenic infarcts, loop recorder was placed and so far no afib. Continued on praluent and repeat LDL and TG much improved.   Found evidence of restless leg syndrome, microcytic anemia and low iron level. Put on iron pills and  requip at night. Symptoms much improved. He also has CAD with PVCs, following with cardiology with medical treatment. Repeat iron level and Hb much improved.  Plan:  - continue plavix and priluent for stroke prevention - continue loop recorder monitoring - follow up with cardiology - continue requip  for RLS. - Follow up with your primary care physician for stroke risk factor modification. Recommend maintain blood pressure goal <130/80, diabetes with hemoglobin A1c goal below 7.0% and lipids with LDL cholesterol goal below 70 mg/dL.  - check BP at home and record. - follow up as needed.  I spent more than 25 minutes of face to face time with the patient. Greater than 50% of time was spent in counseling and coordination of care. We discussed restless leg syndrome treatment, continue iron supplement and continued cardiac monitoring.    No orders of the defined types were placed in this encounter.   No orders of the defined types were placed in this encounter.   Patient Instructions  - continue plavix and priluent for stroke prevention - continue loop recorder monitoring - follow up with cardiology - continue requip for RLS. - Follow up with your primary care physician for stroke risk factor modification. Recommend maintain blood pressure goal <130/80, diabetes with hemoglobin A1c goal below 7.0% and lipids with LDL cholesterol goal below 70 mg/dL.  - check BP at home and record. - follow up as needed.   Marvel PlanJindong Nathanial Arrighi, MD PhD [facility] [address], [address] [place], [place] [id number] [phone]

## 2016-11-08 ENCOUNTER — Telehealth: Payer: Self-pay | Admitting: Family Medicine

## 2016-11-08 DIAGNOSIS — H353221 Exudative age-related macular degeneration, left eye, with active choroidal neovascularization: Secondary | ICD-10-CM | POA: Diagnosis not present

## 2016-11-08 DIAGNOSIS — H353211 Exudative age-related macular degeneration, right eye, with active choroidal neovascularization: Secondary | ICD-10-CM | POA: Diagnosis not present

## 2016-11-08 NOTE — Telephone Encounter (Signed)
Please call in.  Thanks.   

## 2016-11-08 NOTE — Telephone Encounter (Signed)
Wife called about filling out a PASS form for prolia,  Wife states that the form is either on line or in the office. Person Thayer Ohm(Chris) at [employer] stated that he can send form if we do not have it in the office.  His number is [phone] option 1 cb number is [phone]

## 2016-11-08 NOTE — Telephone Encounter (Signed)
Rx called to pharmacy as instructed. 

## 2016-11-09 NOTE — Telephone Encounter (Signed)
He was on praluent, not prolia.  Please check on this.  Thanks.

## 2016-11-09 NOTE — Telephone Encounter (Signed)
This pt is not on my list as a prolia participant, nor do I see it on his med list. I looked in his chart and have not found a Dx of osteoporosis or a recent bone density. pls advise

## 2016-11-09 NOTE — Telephone Encounter (Signed)
It was about his praluent, papers at your desk.

## 2016-11-13 NOTE — Telephone Encounter (Signed)
My part of the form is done.  Thanks.

## 2016-11-14 NOTE — Telephone Encounter (Signed)
[name] with P[employer] left v/m that sections 3,4,5,and 6 is not completed. [name] request cb,.

## 2016-11-14 NOTE — Telephone Encounter (Addendum)
Mrs [name] left v/m requesting status of PASS paperwork. Mrs [name] said someone else called and the paperwork was picked up at front desk. Nothing further needed.

## 2016-11-14 NOTE — Telephone Encounter (Signed)
Patient's wife notified that form is ready for pickup. Advised Mrs. Descoteaux that there are portions that they need to complete and mail in along with what Dr. Para Marchuncan has completed. . Forms left up front for pickup.

## 2016-11-14 NOTE — Telephone Encounter (Signed)
Spoke to [name] and advised her to let [name] [name] know to disregard the paperwork she received. Forms given to patient and his wife for them to complete their part of the forms and they will mail them in.

## 2016-11-18 ENCOUNTER — Ambulatory Visit (INDEPENDENT_AMBULATORY_CARE_PROVIDER_SITE_OTHER): Payer: PPO | Admitting: *Deleted

## 2016-11-18 DIAGNOSIS — I63429 Cerebral infarction due to embolism of unspecified anterior cerebral artery: Secondary | ICD-10-CM | POA: Diagnosis not present

## 2016-11-21 NOTE — Progress Notes (Signed)
Carelink Summary Report / Loop Recorder 

## 2016-11-24 DIAGNOSIS — R351 Nocturia: Secondary | ICD-10-CM | POA: Diagnosis not present

## 2016-11-24 DIAGNOSIS — R339 Retention of urine, unspecified: Secondary | ICD-10-CM | POA: Diagnosis not present

## 2016-11-25 ENCOUNTER — Encounter: Payer: Self-pay | Admitting: Internal Medicine

## 2016-11-25 ENCOUNTER — Ambulatory Visit (INDEPENDENT_AMBULATORY_CARE_PROVIDER_SITE_OTHER): Payer: PPO | Admitting: Internal Medicine

## 2016-11-25 VITALS — BP 138/80 | HR 66 | Temp 97.4°F | Wt 165.0 lb

## 2016-11-25 DIAGNOSIS — K5792 Diverticulitis of intestine, part unspecified, without perforation or abscess without bleeding: Secondary | ICD-10-CM | POA: Diagnosis not present

## 2016-11-25 MED ORDER — AMOXICILLIN-POT CLAVULANATE 875-125 MG PO TABS: 1.0000 | ORAL_TABLET | Freq: Two times a day (BID) | ORAL | 0 refills | Status: AC

## 2016-11-25 NOTE — Progress Notes (Signed)
Pre visit review using our clinic review tool, if applicable. No additional management support is needed unless otherwise documented below in the visit note. 

## 2016-11-25 NOTE — Progress Notes (Signed)
Subjective:    Patient ID: [name], male    DOB: May 30, 1926, 83 y.o.   MRN: [id number]  HPI Here with wife--thinks he has diverticulitis  Symptoms started 3-4 days ago Lower abdominal pain--and "different places" No fever No chills or sweats No N/V Appetite is okay--no change with this Bowels usually in AM--- no blood  Hasn't tried any Rx  Current Outpatient Prescriptions on File Prior to Visit  Medication Sig Dispense Refill  . Bevacizumab (AVASTIN IV) Place 1 Applicatorful into both eyes See admin instructions. Right eye every 8 weeks Left eye every 8 weeks    . cetirizine (ZYRTEC) 10 MG tablet Take 10 mg by mouth daily as needed for allergies.     [name] Kitchen clopidogrel (PLAVIX) 75 MG tablet TAKE 1 TABLET BY MOUTH DAILY 30 tablet 12  . diltiazem (CARDIZEM CD) 180 MG 24 hr capsule TAKE ONE CAPSULE BY MOUTH DAILY 30 capsule 5  . ferrous sulfate 325 (65 FE) MG tablet Take 325 mg by mouth daily with breakfast.    . fluticasone (FLONASE) 50 MCG/ACT nasal spray Place 2 sprays into both nostrils daily as needed for allergies. 16 g 5  . gabapentin (NEURONTIN) 300 MG capsule TAKE ONE (1) CAPSULE BY MOUTH 2 TIMES DAILY 180 capsule 3  . gemfibrozil (LOPID) 600 MG tablet TAKE 1 TABLET BY MOUTH TWICE A DAY 60 tablet 5  . hydrochlorothiazide (HYDRODIURIL) 25 MG tablet Take 1 tablet (25 mg total) by mouth daily. 30 tablet 12  . HYDROcodone-acetaminophen (NORCO/VICODIN) 5-325 MG tablet TAKE ONE (1) TABLET BY MOUTH EVERY SIX HOURS AS NEEDED FOR MODERATE PAIN (SEDATION CAUTION) 30 tablet 0  . Hypromellose (ARTIFICIAL TEARS OP) Apply 1 drop to eye daily as needed (itchy / watery eyes).    [name] Kitchen loperamide (IMODIUM) 2 MG capsule Take 1 capsule (2 mg total) by mouth 3 (three) times daily as needed for diarrhea or loose stools. 10 capsule 0  . meclizine (ANTIVERT) 25 MG tablet Take 0.5 tablets (12.5 mg total) by mouth 3 (three) times daily as needed for dizziness.    . Multiple Vitamins-Minerals (OCUVITE  ADULT 50+ PO) Take 1 capsule by mouth daily.      . nitroGLYCERIN (NITROSTAT) 0.4 MG SL tablet Place 1 tablet (0.4 mg total) under the tongue every 5 (five) minutes as needed for chest pain (max 3 doses in 15 minutes). 25 tablet prn  . pantoprazole (PROTONIX) 40 MG tablet TAKE ONE TABLET BY MOUTH EVERY MORNING 30 tablet 11  . PRALUENT 150 MG/ML SOPN INJECT  (CONTENTS OF ONE SYRINGE) INTO THE SKIN ONCE EVERY 14 DAYS. 2 pen 5  . ranitidine (ZANTAC) 150 MG tablet TAKE 1 TABLET BY MOUTH TWICE A DAY 60 tablet 5  . rOPINIRole (REQUIP) 1 MG tablet TAKE ONE TABLET BY MOUTH EVERY NIGHT AT BEDTIME 30 tablet 5  . sucralfate (CARAFATE) 1 g tablet TAKE 1 TABLET BY MOUTH BEFORE LAYING DOWN FOR NAP ORAS NEEDED    . tamsulosin (FLOMAX) 0.4 MG CAPS capsule Take 0.4 mg by mouth daily.     . traMADol (ULTRAM) 50 MG tablet TAKE ONE TABLET BY MOUTH EVERY EIGHT HOURS AS NEEDED MODERATE TO SEVERE PAIN 30 tablet 2  . vitamin B-12 (CYANOCOBALAMIN) 1000 MCG tablet Take 2 tablets (2,000 mcg total) by mouth daily.    . [DISCONTINUED] lisinopril (PRINIVIL,ZESTRIL) 10 MG tablet Take 1 tablet (10 mg total) by mouth daily. 30 tablet 0   No current facility-administered medications on file prior to visit.  Allergies  Allergen Reactions  . Atorvastatin Other (See Comments)    REACTION: muscles tense up   . Codeine Nausea And Vomiting  . Lisinopril Swelling and Other (See Comments)    Lips and face swelling  . Statins Other (See Comments)    Muscle tense up    Past Medical History:  Diagnosis Date  . Back pain    improved with gabapentin as of 2015  . Barrett esophagus 09/2003  . Diverticulosis   . GERD (gastroesophageal reflux disease)   . GI AVM (gastrointestinal arteriovenous vascular malformation)   . Hearing loss   . Hemorrhoids   . Hemorrhoids   . History of Holter monitoring    a. holter 9/17: NSR, PVCs; no sustained arrhythmia; PVC burden 8%  . History of nuclear stress test    a. Myoview 8/17:  Intermediate risk, inferior ischemia, not gated  . History of prostatitis   . Hyperlipidemia   . Hypertension   . Macular degeneration, wet (HCC)    receiving intra-ocular injections ([facility])  . PUD (peptic ulcer disease)   . Stroke [facility])     Past Surgical History:  Procedure Laterality Date  . EP IMPLANTABLE DEVICE N/A 12/24/2015   Procedure: Loop Recorder Insertion;  Surgeon: Hillis Range, MD;  Location: [facility] INVASIVE CV LAB;  Service: Cardiovascular;  Laterality: N/A;  . INGUINAL HERNIA REPAIR  01/20/2000  . LAPAROSCOPIC APPENDECTOMY N/A 07/07/2014   Procedure: APPENDECTOMY LAPAROSCOPIC;  Surgeon: Manus Rudd, MD;  Location: [facility] OR;  Service: General;  Laterality: N/A;  . TEE WITHOUT CARDIOVERSION N/A 10/28/2015   Procedure: TRANSESOPHAGEAL ECHOCARDIOGRAM (TEE);  Surgeon: Laurey Morale, MD;  Location: [facility] ENDOSCOPY;  Service: Cardiovascular;  Laterality: N/A;    Family History  Problem Relation Age of Onset  . Heart attack Father   . Coronary artery disease Father   . Hypertension Mother   . Pancreatic cancer Mother     Died at 74.  . Cancer Mother     Pancreatic cancer  . Arthritis Sister   . Hyperlipidemia Sister   . Prostate cancer Brother   . Cancer Brother     Bone Cancer secondary to Prostate Cancer  . Bleeding Disorder Son   . Diabetes Neg Hx     Social History   Social History  . Marital status: Married    Spouse name: Hendrixx Severin  . Number of children: N/A  . Years of education: N/A   Occupational History  . Retired   .  Retired   Social History Main Topics  . Smoking status: Former Smoker    Packs/day: 0.00    Years: 36.00    Types: Cigarettes  . Smokeless tobacco: Never Used     Comment: Quit in Jan 19, 1982  . Alcohol use No  . Drug use: No  . Sexual activity: Not on file   Other Topics Concern  . Not on file   Social History Narrative   6th grade. Married 01/20/1951 1 son, 1 dtr - died at 5 months. Work - Management consultant, Engineer, drilling, Catering manager.    Retired-fulltime. "[employer] ACP - yes DNR, yes short-term mechanical ventilarion, no heroic or futile measures in face of irreversible.   Review of Systems  No cough No SOB     Objective:   Physical Exam  Constitutional: He appears well-developed. No distress.  Pulmonary/Chest: Effort normal and breath sounds normal. No respiratory distress. He has no wheezes. He has no rales.  Abdominal: Soft. He exhibits no distension. There  is no rebound and no guarding.  Mild diffuse tenderness--most prominent in LLQ          Assessment & Plan:

## 2016-11-25 NOTE — Assessment & Plan Note (Signed)
Fairly typical presentation---and just the same for him as with past spells Tried to wait it out but persists Didn't do well with past Rx---will try augmentin instead

## 2016-11-28 ENCOUNTER — Other Ambulatory Visit: Payer: Self-pay | Admitting: Family Medicine

## 2016-11-28 NOTE — Telephone Encounter (Signed)
Received refill electronically Last refill 05/17/16 #2 pen/5  Last office visit 09/01/16

## 2016-11-29 ENCOUNTER — Emergency Department (HOSPITAL_COMMUNITY)
Admission: EM | Admit: 2016-11-29 | Discharge: 2016-11-29 | Disposition: A | Discharge status: Home / Self Care | Payer: PPO | Attending: Emergency Medicine | Admitting: Emergency Medicine

## 2016-11-29 ENCOUNTER — Telehealth: Payer: Self-pay | Admitting: Family Medicine

## 2016-11-29 ENCOUNTER — Emergency Department (HOSPITAL_COMMUNITY): Payer: PPO

## 2016-11-29 ENCOUNTER — Encounter (HOSPITAL_COMMUNITY): Payer: Self-pay

## 2016-11-29 DIAGNOSIS — Z8673 Personal history of transient ischemic attack (TIA), and cerebral infarction without residual deficits: Secondary | ICD-10-CM | POA: Insufficient documentation

## 2016-11-29 DIAGNOSIS — N50811 Right testicular pain: Secondary | ICD-10-CM | POA: Insufficient documentation

## 2016-11-29 DIAGNOSIS — R229 Localized swelling, mass and lump, unspecified: Secondary | ICD-10-CM | POA: Insufficient documentation

## 2016-11-29 DIAGNOSIS — J449 Chronic obstructive pulmonary disease, unspecified: Secondary | ICD-10-CM | POA: Diagnosis not present

## 2016-11-29 DIAGNOSIS — IMO0002 Reserved for concepts with insufficient information to code with codable children: Secondary | ICD-10-CM

## 2016-11-29 DIAGNOSIS — Z79899 Other long term (current) drug therapy: Secondary | ICD-10-CM | POA: Diagnosis not present

## 2016-11-29 DIAGNOSIS — Z87891 Personal history of nicotine dependence: Secondary | ICD-10-CM | POA: Diagnosis not present

## 2016-11-29 DIAGNOSIS — N50812 Left testicular pain: Secondary | ICD-10-CM | POA: Diagnosis not present

## 2016-11-29 DIAGNOSIS — N50819 Testicular pain, unspecified: Secondary | ICD-10-CM

## 2016-11-29 DIAGNOSIS — I10 Essential (primary) hypertension: Secondary | ICD-10-CM | POA: Diagnosis not present

## 2016-11-29 LAB — URINALYSIS, ROUTINE W REFLEX MICROSCOPIC
Bacteria, UA: NONE SEEN
Bilirubin Urine: NEGATIVE
Glucose, UA: NEGATIVE mg/dL
Ketones, ur: NEGATIVE mg/dL
Leukocytes, UA: NEGATIVE
Nitrite: NEGATIVE
Protein, ur: NEGATIVE mg/dL
Specific Gravity, Urine: 1.01 (ref 1.005–1.030)
Squamous Epithelial / LPF: NONE SEEN
WBC, UA: NONE SEEN WBC/hpf (ref 0–5)
pH: 5 (ref 5.0–8.0)

## 2016-11-29 NOTE — ED Provider Notes (Signed)
[facility]-EMERGENCY DEPT Provider Note   CSN: [id number] Arrival date & time: 11/29/16  1457   By signing my name below, I, [name], attest that this documentation has been prepared under the direction and in the presence of Nira Conn, MD  Electronically Signed: Clovis Pu, ED Scribe. 11/29/16. 6:00 PM.   History   Chief Complaint Chief Complaint  Patient presents with  . Testicle Pain    HPI Comments:  [name] is a 83 y.o. male who presents to the Emergency Department complaining of acute onset, moderate bilateral testicular pain x 4 days. Pt is on Tramadol, gabapentin and norco. Pt denies current abdominal pain, dysuria or any other associated symptoms. Pt was seen at his PCP's office 4 days ago for abdominal pain, was diagnosed with diverticulitis and was prescribed antibiotics which he has been taking. No other complaints noted.   Urologist: Dr. Sherron Monday   The history is provided by the patient. No language interpreter was used.  Testicle Pain  This is a new problem. The current episode started more than 2 days ago. The problem occurs constantly. The problem has not changed since onset.Pertinent negatives include no abdominal pain. Nothing relieves the symptoms. He has tried nothing for the symptoms.    Past Medical History:  Diagnosis Date  . Back pain    improved with gabapentin as of 2015  . Barrett esophagus 09/2003  . Diverticulosis   . GERD (gastroesophageal reflux disease)   . GI AVM (gastrointestinal arteriovenous vascular malformation)   . Hearing loss   . Hemorrhoids   . Hemorrhoids   . History of Holter monitoring    a. holter 9/17: NSR, PVCs; no sustained arrhythmia; PVC burden 8%  . History of nuclear stress test    a. Myoview 8/17: Intermediate risk, inferior ischemia, not gated  . History of prostatitis   . Hyperlipidemia   . Hypertension   . Macular degeneration, wet (HCC)    receiving intra-ocular injections ([facility])  . PUD  (peptic ulcer disease)   . Stroke [facility])     Patient Active Problem List   Diagnosis Date Noted  . Pain in joint, shoulder region 03/30/2016  . Lower back pain 03/30/2016  . Tennis elbow 03/30/2016  . Iron deficiency 03/07/2016  . Restless leg syndrome 03/04/2016  . Leg pain 01/28/2016  . Vitamin B 12 deficiency 01/28/2016  . Knee pain 12/06/2015  . Fatigue 12/06/2015  . 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/27/2015  . Palpitations 04/03/2015  . Mixed hyperlipidemia 04/03/2015  . Cerebral infarction due to stenosis of cerebral artery (HCC) 04/03/2015  . Stroke (HCC)   . Advance care planning 08/21/2014  . Memory change 08/21/2014  . Acute diverticulitis 08/16/2014  . Ectatic abdominal aorta (HCC) 08/16/2014  . COPD exacerbation (HCC)   . Abdominal pain   . GERD (gastroesophageal reflux disease) 05/29/2014  . Left flank pain, chronic 01/22/2013  . Hypokalemia 12/28/2012  . Angioedema of lips 12/28/2012  . Medicare annual wellness visit, subsequent 08/15/2011  . Angiodysplasia of intestine 11/30/2009  . HEARING LOSS, BILATERAL 07/27/2009  . DYSPHAGIA 08/21/2008  . Barrett's esophagus 05/16/2008  . TEMPOROMANDIBULAR JOINT DISORDER 10/16/2007  . UMBILICAL HERNIA 09/22/2007  . Essential hypertension 05/15/2007  . HEMORRHOIDS 05/15/2007    Past Surgical History:  Procedure Laterality Date  . EP IMPLANTABLE DEVICE N/A 12/24/2015   Procedure: Loop Recorder Insertion;  Surgeon: Hillis Range, MD;  Location: [facility] INVASIVE CV LAB;  Service: Cardiovascular;  Laterality: N/A;  . INGUINAL HERNIA REPAIR  2001  . LAPAROSCOPIC APPENDECTOMY N/A 07/07/2014   Procedure: APPENDECTOMY LAPAROSCOPIC;  Surgeon: Manus Rudd, MD;  Location: [facility] OR;  Service: General;  Laterality: N/A;  . TEE WITHOUT CARDIOVERSION N/A 10/28/2015   Procedure: TRANSESOPHAGEAL ECHOCARDIOGRAM (TEE);  Surgeon: Laurey Morale, MD;  Location: [facility] ENDOSCOPY;  Service: Cardiovascular;  Laterality: N/A;       Home Medications    Prior to  Admission medications   Medication Sig Start Date End Date Taking? Authorizing Provider  amoxicillin-clavulanate (AUGMENTIN) 875-125 MG tablet Take 1 tablet by mouth 2 (two) times daily. 11/25/16 12/05/16  Karie Schwalbe, MD  Bevacizumab (AVASTIN IV) Place 1 Applicatorful into both eyes See admin instructions. Right eye every 8 weeks Left eye every 8 weeks    Historical Provider, MD  cetirizine (ZYRTEC) 10 MG tablet Take 10 mg by mouth daily as needed for allergies.     Historical Provider, MD  clopidogrel (PLAVIX) 75 MG tablet TAKE 1 TABLET BY MOUTH DAILY 03/11/16   Joaquim Nam, MD  diltiazem (CARDIZEM CD) 180 MG 24 hr capsule TAKE ONE CAPSULE BY MOUTH DAILY 08/25/16   Joaquim Nam, MD  ferrous sulfate 325 (65 FE) MG tablet Take 325 mg by mouth daily with breakfast.    Historical Provider, MD  fluticasone (FLONASE) 50 MCG/ACT nasal spray Place 2 sprays into both nostrils daily as needed for allergies. 08/21/15   Joaquim Nam, MD  gabapentin (NEURONTIN) 300 MG capsule TAKE ONE (1) CAPSULE BY MOUTH 2 TIMES DAILY 07/27/16   Joaquim Nam, MD  gemfibrozil (LOPID) 600 MG tablet TAKE 1 TABLET BY MOUTH TWICE A DAY 08/16/16   Joaquim Nam, MD  hydrochlorothiazide (HYDRODIURIL) 25 MG tablet Take 1 tablet (25 mg total) by mouth daily. 09/15/15   Joaquim Nam, MD  HYDROcodone-acetaminophen (NORCO/VICODIN) 5-325 MG tablet TAKE ONE (1) TABLET BY MOUTH EVERY SIX HOURS AS NEEDED FOR MODERATE PAIN (SEDATION CAUTION) 08/18/16   Joaquim Nam, MD  Hypromellose (ARTIFICIAL TEARS OP) Apply 1 drop to eye daily as needed (itchy / watery eyes).    Historical Provider, MD  loperamide (IMODIUM) 2 MG capsule Take 1 capsule (2 mg total) by mouth 3 (three) times daily as needed for diarrhea or loose stools. 07/15/14   Elease Etienne, MD  meclizine (ANTIVERT) 25 MG tablet Take 0.5 tablets (12.5 mg total) by mouth 3 (three) times daily as needed for dizziness. 12/03/15   Joaquim Nam, MD  Multiple  Vitamins-Minerals (OCUVITE ADULT 50+ PO) Take 1 capsule by mouth daily.      Historical Provider, MD  nitroGLYCERIN (NITROSTAT) 0.4 MG SL tablet Place 1 tablet (0.4 mg total) under the tongue every 5 (five) minutes as needed for chest pain (max 3 doses in 15 minutes). 03/29/16   Joaquim Nam, MD  pantoprazole (PROTONIX) 40 MG tablet TAKE ONE TABLET BY MOUTH EVERY MORNING 09/24/16   Joaquim Nam, MD  PRALUENT 150 MG/ML SOPN INJECT 1 SYRINGE INTO THE SKIN ONCE EVERY 14 DAYS 11/29/16   Joaquim Nam, MD  ranitidine (ZANTAC) 150 MG tablet TAKE 1 TABLET BY MOUTH TWICE A DAY 07/27/16   Joaquim Nam, MD  rOPINIRole (REQUIP) 1 MG tablet TAKE ONE TABLET BY MOUTH EVERY NIGHT AT BEDTIME 06/04/16   Marvel Plan, MD  sucralfate (CARAFATE) 1 g tablet TAKE 1 TABLET BY MOUTH BEFORE LAYING DOWN FOR NAP ORAS NEEDED    Historical Provider, MD  tamsulosin (FLOMAX) 0.4 MG CAPS capsule Take 0.4  mg by mouth daily.  10/26/15   Historical Provider, MD  traMADol (ULTRAM) 50 MG tablet TAKE ONE TABLET BY MOUTH EVERY EIGHT HOURS AS NEEDED MODERATE TO SEVERE PAIN 11/08/16   Joaquim Nam, MD  vitamin B-12 (CYANOCOBALAMIN) 1000 MCG tablet Take 2 tablets (2,000 mcg total) by mouth daily. 01/28/16   Joaquim Nam, MD    Family History Family History  Problem Relation Age of Onset  . Heart attack Father   . Coronary artery disease Father   . Hypertension Mother   . Pancreatic cancer Mother     Died at 32.  . Cancer Mother     Pancreatic cancer  . Arthritis Sister   . Hyperlipidemia Sister   . Prostate cancer Brother   . Cancer Brother     Bone Cancer secondary to Prostate Cancer  . Bleeding Disorder Son   . Diabetes Neg Hx     Social History Social History  Substance Use Topics  . Smoking status: Former Smoker    Packs/day: 0.00    Years: 36.00    Types: Cigarettes  . Smokeless tobacco: Never Used     Comment: Quit in 1983  . Alcohol use No     Allergies   Atorvastatin; Codeine; Lisinopril; and  Statins   Review of Systems Review of Systems  Gastrointestinal: Negative for abdominal pain.  Genitourinary: Positive for testicular pain.   All other systems reviewed and are negative for acute change except as noted in the HPI.  Physical Exam Updated Vital Signs BP (!) 148/94   Pulse 80   Temp 99.4 F (37.4 C) (Oral)   Resp 18   Ht  (1.651 m)   Wt 165 lb (74.8 kg)   SpO2 95%   BMI 27.46 kg/m   Physical Exam  Constitutional: He is oriented to person, place, and time. He appears well-developed and well-nourished. No distress.  HENT:  Head: Normocephalic and atraumatic.  Nose: Nose normal.  Eyes: Conjunctivae and EOM are normal. Pupils are equal, round, and reactive to light. Right eye exhibits no discharge. Left eye exhibits no discharge. No scleral icterus.  Neck: Normal range of motion. Neck supple.  Cardiovascular: Normal rate and regular rhythm.  Exam reveals no gallop and no friction rub.   No murmur heard. Pulmonary/Chest: Effort normal and breath sounds normal. No stridor. No respiratory distress. He has no rales.  Abdominal: Soft. He exhibits no distension. There is no tenderness. Hernia confirmed negative in the right inguinal area and confirmed negative in the left inguinal area.  Genitourinary: Penis normal. Right testis shows tenderness. Right testis shows no mass and no swelling. Left testis shows tenderness. Left testis shows no mass and no swelling. Left testis is descended.  Musculoskeletal: He exhibits no edema or tenderness.  Lymphadenopathy: No inguinal adenopathy noted on the right or left side.  Neurological: He is alert and oriented to person, place, and time.  Skin: Skin is warm and dry. No rash noted. He is not diaphoretic. No erythema.  Psychiatric: He has a normal mood and affect.  Vitals reviewed.    ED Treatments / Results  DIAGNOSTIC STUDIES:  Oxygen Saturation is 95% on RA, normal by my interpretation.    COORDINATION OF  CARE:  5:54 PM Discussed treatment plan with pt at bedside and pt agreed to plan.  Labs (all labs ordered are listed, but only abnormal results are displayed) Labs Reviewed  URINALYSIS, ROUTINE W REFLEX MICROSCOPIC - Abnormal; Notable for the following:  Result Value   Hgb urine dipstick SMALL (*)    All other components within normal limits    EKG  EKG Interpretation None       Radiology US Scrotum  Result Date: 11/29/2016 CLINICAL DATA:  Bilateral testicular pain for 4 days. EXAM: SCROTAL ULTRASOUND DOPPLER ULTRASOUND OF THE TESTICLES TECHNIQUE: Complete ultrasound examination of the testicles, epididymis, and other scrotal structures was performed. Color and spectral Doppler ultrasound were also utilized to evaluate blood flow to the testicles. COMPARISON:  Scrotal ultrasound 05/22/2014. FINDINGS: Right testicle Measurements: 2.8 x 1.8 x 2.4 cm. No mass or microlithiasis visualized. Left testicle Measurements: 3.1 x 1.5 x 2.6 cm. There is a hypoechoic area in the lower pole which may be a focus of edema. Right epididymis:  Normal in size and appearance. Left epididymis: Cyst in the left epididymis measures 0.5 cm. Two cysts are also seen in the tail measuring 0.5 and 0.8 cm in diameter. Hydrocele:  None visualized. Varicocele:  None visualized. Pulsed Doppler interrogation of both testes demonstrates normal low resistance arterial and venous waveforms bilaterally. IMPRESSION: Possible hypoechoic focus lower pole left testicle could be a focus of edema or inflammation but could be artifactual. Recommend followup ultrasound in 6-8 weeks to ensure resolution. Electronically Signed   By: Drusilla Kanner M.D.   On: 11/29/2016 16:41   [place] Art/ven Flow Abd Pelv Doppler  Result Date: 11/29/2016 CLINICAL DATA:  Bilateral testicular pain for 4 days. EXAM: SCROTAL ULTRASOUND DOPPLER ULTRASOUND OF THE TESTICLES TECHNIQUE: Complete ultrasound examination of the testicles, epididymis, and other  scrotal structures was performed. Color and spectral Doppler ultrasound were also utilized to evaluate blood flow to the testicles. COMPARISON:  Scrotal ultrasound 05/22/2014. FINDINGS: Right testicle Measurements: 2.8 x 1.8 x 2.4 cm. No mass or microlithiasis visualized. Left testicle Measurements: 3.1 x 1.5 x 2.6 cm. There is a hypoechoic area in the lower pole which may be a focus of edema. Right epididymis:  Normal in size and appearance. Left epididymis: Cyst in the left epididymis measures 0.5 cm. Two cysts are also seen in the tail measuring 0.5 and 0.8 cm in diameter. Hydrocele:  None visualized. Varicocele:  None visualized. Pulsed Doppler interrogation of both testes demonstrates normal low resistance arterial and venous waveforms bilaterally. IMPRESSION: Possible hypoechoic focus lower pole left testicle could be a focus of edema or inflammation but could be artifactual. Recommend followup ultrasound in 6-8 weeks to ensure resolution. Electronically Signed   By: Drusilla Kanner M.D.   On: 11/29/2016 16:41    Procedures Procedures (including critical care time)  Medications Ordered in ED Medications - No data to display   Initial Impression / Assessment and Plan / ED Course  I have reviewed the triage vital signs and the nursing notes.  Pertinent labs & imaging results that were available during my care of the patient were reviewed by me and considered in my medical decision making (see chart for details).     Ultrasound without evidence of epididymitis/orchitis, torsion. Did know hypoechoic Focus on the left testicle that is unspecific. UA without evidence of infection. Likely referred pain from diverticulitis. Instructed to continue antibiotics for diverticulitis and to monitor this pain for 5-7 days. If the pain has not resolved he is instructed to follow-up with his urologist for further management. Patient made aware of the hypoechoic focus on the ultrasound which can be followed up  with urology.  The patient is safe for discharge with strict return precautions.   Final Clinical  Impressions(s) / ED Diagnoses   Final diagnoses:  Mass  Testicular pain   Disposition: Discharge  Condition: Good  I have discussed the results, Dx and Tx plan with the patient who expressed understanding and agree(s) with the plan. Discharge instructions discussed at great length. The patient was given strict return precautions who verbalized understanding of the instructions. No further questions at time of discharge.    New Prescriptions   No medications on file    Follow Up: Urologist  Schedule an appointment as soon as possible for a visit  in 5-7 days, If symptoms do not improve or  worsen   I personally performed the services described in this documentation, which was scribed in my presence. The recorded information has been reviewed and is accurate.        Nira Conn, MD 11/29/16 [phone]

## 2016-11-29 NOTE — Discharge Instructions (Signed)
.  During the workup we noted incidental findings on your imaging that would require you to follow-up with your regular doctor for further evaluation/management:  1.  Possible hypoechoic focus lower pole left testicle could be a focus  of edema or inflammation but could be artifactual. Recommend  followup ultrasound in 6-8 weeks to ensure resolution.

## 2016-11-29 NOTE — Telephone Encounter (Signed)
Sent. Thanks.   

## 2016-11-29 NOTE — Telephone Encounter (Signed)
[facility] Day - Client TELEPHONE ADVICE RECORD [facility] Medical Call Center  Patient Name: [name]  DOB: Jan 19, 1926    Initial Comment Caller states her husband had lower abdominal pain, on antibiotics, history of diverticulitis, started having testicular pain a couple of hours ago   Nurse Assessment  Nurse: Odis Luster, RN, Bjorn Loser Date/Time (Eastern Time): 11/29/2016 2:20:30 PM  Confirm and document reason for call. If symptomatic, describe symptoms. ---Caller states her husband had lower abdominal pain, on antibiotics, history of diverticulitis, started having testicular pain a couple of hours ago. Denies fever.  Does the patient have any new or worsening symptoms? ---Yes  Will a triage be completed? ---Yes  Related visit to physician within the last 2 weeks? ---Yes  Does the PT have any chronic conditions? (i.e. diabetes, asthma, etc.) ---Yes  List chronic conditions. ---HTN; acid reflux; TIA's;  Is this a behavioral health or substance abuse call? ---No     Guidelines    Guideline Title Affirmed Question Affirmed Notes  Scrotal Pain Vomiting    Final Disposition User   Go to ED Now Odis Luster, RN, Rhonda    Referrals  [facility] - ED   Disagree/Comply: Comply

## 2016-11-29 NOTE — ED Triage Notes (Signed)
Pt reports having abdominal pain he was seen and treated for diverticulosis 4 days ago, he was also having some testicular pain that he thought was related to the same, now his abdominal pain is gone but he still has the pain in his testicles, concerned something else is going on.

## 2016-12-01 LAB — CUP PACEART REMOTE DEVICE CHECK
Date Time Interrogation Session: 20180330200706
Implantable Pulse Generator Implant Date: 20170504

## 2016-12-09 ENCOUNTER — Ambulatory Visit (INDEPENDENT_AMBULATORY_CARE_PROVIDER_SITE_OTHER): Payer: PPO | Admitting: Family Medicine

## 2016-12-09 ENCOUNTER — Encounter: Payer: Self-pay | Admitting: Family Medicine

## 2016-12-09 DIAGNOSIS — K5792 Diverticulitis of intestine, part unspecified, without perforation or abscess without bleeding: Secondary | ICD-10-CM

## 2016-12-09 NOTE — Patient Instructions (Addendum)
We'll contact you in about 1 month to repeat the ultrasound.  Gradually eat a little more with each meal.  Update me as needed.  I think you'll gradually improve.  Take care.  Glad to see you.

## 2016-12-09 NOTE — Progress Notes (Signed)
ER f/u.   He was initially seen here in the clinic with presumed diverticulitis and started on antibiotics. He had fairly typical symptoms at that point. Subsequently he went on to develop testicular pain. He was advised to go to the emergency room.   ER course and previous office visit discussed with patient. Noted on imaging to have possible hypoechoic focus lower pole left testicle which could be a focus of edema or inflammation but could be artifactual. Recommend followup ultrasound in 6-8 weeks to ensure resolution.  Here for follow-up in the meantime. Still with lack of energy.   He has back pain in the AM when he gets out of bed but it gets better as the day goes on.  This is a chronic issue for the patient may get better each day. This seems to be unrelated to the other issues. He does not have back pain at this point.  He is done with abx.  No blood in stool.  No black stools except for iron use.  His testicular pain is clearly improved in the meantime. No fevers. No discharge.  PMH and SH reviewed  ROS: Per HPI unless specifically indicated in ROS section   Meds, vitals, and allergies reviewed.   GEN: nad, alert and oriented HEENT: mucous membranes moist NECK: supple w/o LA CV: rrr.  PULM: ctab, no inc wob ABD: soft, +bs EXT: no edema Back not tender to palpation. No CVA pain.

## 2016-12-09 NOTE — Progress Notes (Signed)
Pre visit review using our clinic review tool, if applicable. No additional management support is needed unless otherwise documented below in the visit note. 

## 2016-12-11 ENCOUNTER — Encounter: Payer: Self-pay | Admitting: Family Medicine

## 2016-12-11 NOTE — Assessment & Plan Note (Signed)
I think this was the main issue. His belly is better, he is not tender on exam today. He is not passing any blood and he is done with his antibiotics. I think the most likely scenario is for the patient to have had acute diverticulitis with referred pain into the scrotum/testicle. He's clearly better in the meantime. His testicular pain is clearly improved, almost totally resolved. At this point observation is likely reasonable. He will update me if not gradually getting better. I think that he is still fatigued from overall illness itself but not from any ominous an ongoing process. We can repeat his ultrasound in about a month. Discussed with patient about the ultrasound findings. There is no definite mass in this is not emergent. He understood. Okay to gradually advance diet at this point.  His back pain seems to be completely separate issue. This seems to happen every morning when he is getting out of bed and I question if his mattress or the way he is sleeping is contributing to this. When he gets up and gets moving as the day goes on he clearly gets better, every day. I think this is completely unrelated to the presumed diverticulitis and refer testicle pain. Discussed with patient.  He can try sleeping in a different beverages to see if he does better. Update me as needed. He agrees.

## 2016-12-13 ENCOUNTER — Other Ambulatory Visit: Payer: Self-pay | Admitting: Family Medicine

## 2016-12-13 NOTE — Telephone Encounter (Signed)
Electronic refill request. Last office visit:   12/09/16 Last Filled:    30 tablet 0 08/18/2016  Please advise.

## 2016-12-14 ENCOUNTER — Encounter: Payer: Self-pay | Admitting: Family Medicine

## 2016-12-14 ENCOUNTER — Ambulatory Visit (INDEPENDENT_AMBULATORY_CARE_PROVIDER_SITE_OTHER): Payer: PPO | Admitting: Family Medicine

## 2016-12-14 VITALS — BP 130/70 | HR 74 | Temp 98.6°F | Wt 161.5 lb

## 2016-12-14 DIAGNOSIS — R1030 Lower abdominal pain, unspecified: Secondary | ICD-10-CM | POA: Diagnosis not present

## 2016-12-14 LAB — CBC WITH DIFFERENTIAL/PLATELET
Basophils Absolute: 0 cells/uL (ref 0–200)
Basophils Relative: 0 %
Eosinophils Absolute: 320 cells/uL (ref 15–500)
Eosinophils Relative: 4 %
HCT: 42.3 % (ref 38.5–50.0)
Hemoglobin: 14.5 g/dL (ref 13.2–17.1)
Lymphocytes Relative: 23 %
Lymphs Abs: 1840 cells/uL (ref 850–3900)
MCH: 29.7 pg (ref 27.0–33.0)
MCHC: 34.3 g/dL (ref 32.0–36.0)
MCV: 86.5 fL (ref 80.0–100.0)
MPV: 8.9 fL (ref 7.5–12.5)
Monocytes Absolute: 720 cells/uL (ref 200–950)
Monocytes Relative: 9 %
Neutro Abs: 5120 cells/uL (ref 1500–7800)
Neutrophils Relative %: 64 %
Platelets: 338 10*3/uL (ref 140–400)
RBC: 4.89 MIL/uL (ref 4.20–5.80)
RDW: 13 % (ref 11.0–15.0)
WBC: 8 10*3/uL (ref 3.8–10.8)

## 2016-12-14 LAB — COMPREHENSIVE METABOLIC PANEL
ALT: 11 U/L (ref 9–46)
AST: 18 U/L (ref 10–35)
Albumin: 3.9 g/dL (ref 3.6–5.1)
Alkaline Phosphatase: 83 U/L (ref 40–115)
BUN: 13 mg/dL (ref 7–25)
CO2: 26 mmol/L (ref 20–31)
Calcium: 8.9 mg/dL (ref 8.6–10.3)
Chloride: 100 mmol/L (ref 98–110)
Creat: 1.22 mg/dL — ABNORMAL HIGH (ref 0.70–1.11)
Glucose, Bld: 81 mg/dL (ref 65–99)
Potassium: 3.5 mmol/L (ref 3.5–5.3)
Sodium: 137 mmol/L (ref 135–146)
Total Bilirubin: 0.3 mg/dL (ref 0.2–1.2)
Total Protein: 7 g/dL (ref 6.1–8.1)

## 2016-12-14 MED ORDER — AMOXICILLIN-POT CLAVULANATE 875-125 MG PO TABS: 1.0000 | ORAL_TABLET | Freq: Two times a day (BID) | ORAL | 0 refills | Status: DC

## 2016-12-14 NOTE — Progress Notes (Signed)
Prev seen for presumed diverticulitis.  Started on abx.  He subsequently had more abd pain groin pain with ER eval.  I saw him prev after the ER eval.  He was getting some better at the last OV.   The plan was for f/u u/s scrotum in the future.    In the meantime, he is not on abx currently.  He had had more abd pain since I saw him last.  He had more pain with a BM this AM.  Vomited more often since the last OV here, now vomiting about daily.  Still with lower abd pain and groin pain.  No blood in stool.  He clearly feels some better after vomiting but the pain still continues.  Pain is constant, sometimes it will wax and wane, sometimes worse than others.  Recent tmax 99.3.    PMH and SH reviewed  ROS: Per HPI unless specifically indicated in ROS section   Meds, vitals, and allergies reviewed.   GEN: nad, alert and oriented HEENT: mucous membranes moist NECK: supple w/o LA CV: rrr. PULM: ctab, no inc wob ABD: soft, +bs, ttp in the lower abd, but not rebound.   EXT: no edema

## 2016-12-14 NOTE — Telephone Encounter (Signed)
Spoke to patient's wife and was advised that he is occasionally still using Hydrocodone when he has severe pain. Patient has appointment with you this afternoon.

## 2016-12-14 NOTE — Progress Notes (Signed)
Pre visit review using our clinic review tool, if applicable. No additional management support is needed unless otherwise documented below in the visit note. 

## 2016-12-14 NOTE — Telephone Encounter (Signed)
Printed.  Thanks.  

## 2016-12-14 NOTE — Patient Instructions (Signed)
Go to the lab on the way out.  We'll contact you with your lab report. Restart augmentin.  Start drinking the contrast tomorrow AM.  CT scan tomorrow.  Talk to [facility] on the way out.  Take care.  Glad to see you.

## 2016-12-14 NOTE — Telephone Encounter (Signed)
I thought he had switched over to tramadol for pain.  Please verify use/pain with patient.  Thanks.   I didn't print yet.

## 2016-12-14 NOTE — Telephone Encounter (Signed)
Prescription given to patient's wife at office visit.

## 2016-12-15 ENCOUNTER — Ambulatory Visit (INDEPENDENT_AMBULATORY_CARE_PROVIDER_SITE_OTHER)
Admission: RE | Admit: 2016-12-15 | Discharge: 2016-12-15 | Disposition: A | Discharge status: Home / Self Care | Payer: PPO | Source: Ambulatory Visit | Attending: Family Medicine | Admitting: Family Medicine

## 2016-12-15 ENCOUNTER — Encounter: Payer: Self-pay | Admitting: Family Medicine

## 2016-12-15 ENCOUNTER — Telehealth: Payer: Self-pay

## 2016-12-15 DIAGNOSIS — R1030 Lower abdominal pain, unspecified: Secondary | ICD-10-CM

## 2016-12-15 DIAGNOSIS — R109 Unspecified abdominal pain: Secondary | ICD-10-CM | POA: Diagnosis not present

## 2016-12-15 MED ORDER — IOPAMIDOL (ISOVUE-300) INJECTION 61%
100.0000 mL | Freq: Once | INTRAVENOUS | Status: AC | PRN
  Administered 2016-12-15: 100 mL via INTRAVENOUS

## 2016-12-15 NOTE — Telephone Encounter (Signed)
PLEASE NOTE: All timestamps contained within this report are represented as Eastern Standard Time. CONFIDENTIALTY NOTICE: This fax transmission is intended only for the addressee. It contains information that is legally privileged, confidential or otherwise protect[place] use or disclosure. If you are not the intended recipient, you are strictly prohibited from reviewing, disclosing, copying using or disseminating any of this information or taking any action in reliance on or regarding this information. If you have received this fax in error, please notify us immediately by telephone so that we can arrange for its return to [place]. Phone: [phone], Toll-Free: [phone], Fax: [phone] Page: 1 of 2 Call Id: [id number] [place] [facility] Night - Client TELEPHONE ADVICE RECORD [facility] Medical Call Center Patient Name: [name] Gender: Male DOB: [birth year] Age: 83 Y 1 M 30 D Return Phone Number: City/State/Zip: [place] Client [place] [facility] Night - Client Client Site  [facility] [place] - Night Physician Raechel Ache - MD Who Is Calling Lab Lab Name [facility] Lab Phone Number [phone] Option 2 Lab Tech Name Merry Proud Lab Reference Number [id number] Chief Complaint Lab Result (Critical or Stat) Call Type Lab Send to RN Reason for Call Report lab results Initial Comment Lab calling with stat lab results for a patient of Dr. Lianne Bushy. Additional Comment Nurse Assessment Nurse: Tawni Pummel, RN, Margaret Date/Time (Eastern Time): 12/14/2016 10:13:30 PM Is there an on-call provider listed? ---Yes Please list name of person reporting value (Lab Employee) and a contact number. ---Merry Proud from [facility] [phone] Please document the following items: Lab name Lab value (read back to lab to verify) Reference range for lab value Date and time blood was drawn Collect time of birth for bilirubin results ---12[birth year]14[birth year]2016 at 5:07 pm crit 1.22  normal value 0.70-1.11 Bun 13 normal 7-25 Please collect the patient contact information from the lab. (name, phone number and address) ---[name] [phone] Guidelines Guideline Title Affirmed Question Disp. Time Lamount Cohen Time) Disposition Final User 12/14/2016 10:23:33 PM Clinical Call Yes Cockrum, RN, [facility] DoctorName Phone DateTime Action Result/Outcome Notes Cheryll Cockayne - MD [phone] 12/14/2016 10:22:53 PM Doctor Paged Called On Call Provider - Sherie Don - MD 12/14/2016 10:23:22 PM Message Result Spoke with On Call - General Spoke to Dr. Lawerance Bach and gave labwork results. PLEASE NOTE: All timestamps contained within this report are represented as [place] Standard Time. CONFIDENTIALTY NOTICE: This fax transmission is intended only for the addressee. It contains information that is legally privileged, confidential or otherwise protected from use or disclosure. If you are not the intended recipient, you are strictly prohibited from reviewing, disclosing, copying using or disseminating any of this information or taking any action in reliance on or regarding this information. If you have received this fax in error, please notify us immediately by telephone so that we can arrange for its return to [place]. Phone: [phone], Toll-Free: [phone], Fax: [phone] Page: 2 of 2 Call Id: [id number]

## 2016-12-15 NOTE — Telephone Encounter (Signed)
Cr 1.22. Stable from prior.

## 2016-12-15 NOTE — Telephone Encounter (Signed)
PLEASE NOTE: All timestamps contained within this report are represented as [place] Standard Time. CONFIDENTIALTY NOTICE: This fax transmission is intended only for the addressee. It contains information that is legally privileged, confidential or otherwise protected from use or disclosure. If you are not the intended recipient, you are strictly prohibited from reviewing, disclosing, copying using or disseminating any of this information or taking any action in reliance on or regarding this information. If you have received this fax in error, please notify us immediately by telephone so that we can arrange for its return to [place]. Phone: [phone], Toll-Free: [phone], Fax: [phone] Page: 1 of 1 Call Id: [id number] [place] [facility] Night - Client TELEPHONE ADVICE RECORD [facility] Medical Call Center Patient Name: [name] Gender: Male DOB: 11-07-1925 Age: 83 Y 2 M 19 D Return Phone Number: City/State/Zip: [place] [facility] Night - Client Client Site [place] [facility] [place] - Night Physician Raechel Ache - MD Who Is Calling Lab Lab Name [facility] Lab Phone Number [phone] option 2 Lab Tech Name Merry Proud Lab Reference Number [id number] Chief Complaint Lab Result (Critical or Stat) Call Type Lab Send to RN Reason for Call Report lab results Initial Comment Caller is Gearldine Bienenstock from [employer]. Dr is Para March. Pt is [name] Jan 10, 1926. CB# [phone] option 2. REF# [id number]. with criticals Additional Comment Nurse Assessment Nurse: Jimmey Ralph, RN, Victorino Dike Date/Time (Eastern Time): 12/14/2016 10:17:28 PM Is there an on-call provider listed? ---Yes Please list name of person reporting value (Lab Employee) and a contact number. ---Merry Proud states she already gave the information to another nurse. [phone] Guidelines Guideline Title Affirmed Question Disp. Time Lamount Cohen Time) Disposition Final User 12/14/2016 10:24:19 PM  Clinical Call Yes Jimmey Ralph, RN, Victorino Dike

## 2016-12-15 NOTE — Assessment & Plan Note (Signed)
The most likely dx is prev diverticulitis, causing referred pain to the groin, possibly incompletely treated and now returning/worsening.  D/w pt.  Still okay for outpatient f/u.  Check CT abd pelvis in the AM.  Restart abx today.  Check labs tonight.  D/w pt about dx and planning.  He agrees.  Not an acute abd.  >25 minutes spent in face to face time with patient, >50% spent in counselling or coordination of care.

## 2016-12-19 ENCOUNTER — Ambulatory Visit (INDEPENDENT_AMBULATORY_CARE_PROVIDER_SITE_OTHER): Payer: PPO | Admitting: *Deleted

## 2016-12-19 DIAGNOSIS — I63429 Cerebral infarction due to embolism of unspecified anterior cerebral artery: Secondary | ICD-10-CM | POA: Diagnosis not present

## 2016-12-19 NOTE — Progress Notes (Signed)
Carelink Summary Report / Loop Recorder 

## 2016-12-24 ENCOUNTER — Other Ambulatory Visit: Payer: Self-pay | Admitting: Neurology

## 2016-12-24 DIAGNOSIS — G2581 Restless legs syndrome: Secondary | ICD-10-CM

## 2016-12-28 ENCOUNTER — Other Ambulatory Visit: Payer: Self-pay | Admitting: Family Medicine

## 2016-12-29 DIAGNOSIS — H353211 Exudative age-related macular degeneration, right eye, with active choroidal neovascularization: Secondary | ICD-10-CM | POA: Diagnosis not present

## 2016-12-29 DIAGNOSIS — H353221 Exudative age-related macular degeneration, left eye, with active choroidal neovascularization: Secondary | ICD-10-CM | POA: Diagnosis not present

## 2017-01-01 LAB — CUP PACEART REMOTE DEVICE CHECK
Date Time Interrogation Session: 20180429203905
Implantable Pulse Generator Implant Date: 20170504

## 2017-01-01 NOTE — Progress Notes (Signed)
Carelink summary report received. Battery status OK. Normal device function. No new symptom episodes, tachy episodes, brady, or pause episodes. No new AF episodes. Monthly summary reports and ROV/PRN 

## 2017-01-03 DIAGNOSIS — H353211 Exudative age-related macular degeneration, right eye, with active choroidal neovascularization: Secondary | ICD-10-CM | POA: Diagnosis not present

## 2017-01-03 DIAGNOSIS — H353221 Exudative age-related macular degeneration, left eye, with active choroidal neovascularization: Secondary | ICD-10-CM | POA: Diagnosis not present

## 2017-01-10 ENCOUNTER — Ambulatory Visit (INDEPENDENT_AMBULATORY_CARE_PROVIDER_SITE_OTHER): Payer: PPO | Admitting: Family Medicine

## 2017-01-10 ENCOUNTER — Encounter: Payer: Self-pay | Admitting: Family Medicine

## 2017-01-10 DIAGNOSIS — G2581 Restless legs syndrome: Secondary | ICD-10-CM

## 2017-01-10 DIAGNOSIS — R9389 Abnormal findings on diagnostic imaging of other specified body structures: Secondary | ICD-10-CM

## 2017-01-10 DIAGNOSIS — M545 Low back pain: Secondary | ICD-10-CM

## 2017-01-10 DIAGNOSIS — R938 Abnormal findings on diagnostic imaging of other specified body structures: Secondary | ICD-10-CM

## 2017-01-10 MED ORDER — PREDNISONE 10 MG PO TABS: ORAL_TABLET | ORAL | 0 refills | Status: DC

## 2017-01-10 NOTE — Progress Notes (Signed)
Legs are not as jumpy on requip and B12 and iron.  D/w pt.    Taking tramadol for pain.  It helps some but not a lot.  It prev helped more.  Pain got worse in the last few days.  More pain the early AM and later in the PM.  Less pain midday. Lower back pain, midline, just below the belt.  No radicular pain.  No B/B incontinence.  Previous CT noted and discussed with patient, he has known degenerative lumbar disease. No FCNAVD.   Still on baseline med o/w.  Prev abd pain is resolved with abx.  Done with abx for a few weeks.  No more testicle pain.  Due for f/u u/s, d/w pt.    PMH and SH reviewed  ROS: Per HPI unless specifically indicated in ROS section   Meds, vitals, and allergies reviewed.   GEN: nad, alert and oriented HEENT: mucous membranes moist NECK: supple w/o LA CV: rrr PULM: ctab, no inc wob ABD: soft, +bs, not ttp EXT: no edema Back not tender in the midline. No paraspinal bilateral lower back tenderness. He has pain there locally, but not worse with palpation. No CVA pain. Straight leg raise is negative bilaterally. Strength and sensation grossly normal for the bilateral lower extremities.

## 2017-01-10 NOTE — Patient Instructions (Signed)
We'll check on getting the ultrasound set up.  Prednisone taper.  2 tabs a day for 5 days, then 1 tab a day for 5 days.  With food. Don't take extra ibuprofen or aleve.  Update me if that doesn't help.  Take care.  Glad to see you.

## 2017-01-11 ENCOUNTER — Encounter: Payer: Self-pay | Admitting: Family Medicine

## 2017-01-11 DIAGNOSIS — R9389 Abnormal findings on diagnostic imaging of other specified body structures: Secondary | ICD-10-CM | POA: Insufficient documentation

## 2017-01-11 NOTE — Assessment & Plan Note (Addendum)
Legs are not as jumpy on requip and B12 and iron.   Continue as is.

## 2017-01-11 NOTE — Assessment & Plan Note (Addendum)
Needs follow-up scrotal ultrasound. Discussed with patient. Will make arrangements for this. He is aware that he could have a previous false positive.  Prev imaging with: Possible hypoechoic focus lower pole left testicle could be a focus of edema or inflammation but could be artifactual.

## 2017-01-11 NOTE — Assessment & Plan Note (Signed)
He has previous CT documented lumbar spine degenerative changes. It is likely that he has a flare of that. Discussed with patient about options. Likely best option is a short course of prednisone with routine cautions, steroid cautions discussed with patient. Okay for outpatient follow-up. Update me if not improved. This is likely a separate issue from his previous abdominal and scrotal pain.

## 2017-01-17 ENCOUNTER — Ambulatory Visit (INDEPENDENT_AMBULATORY_CARE_PROVIDER_SITE_OTHER): Payer: PPO | Admitting: *Deleted

## 2017-01-17 DIAGNOSIS — I63429 Cerebral infarction due to embolism of unspecified anterior cerebral artery: Secondary | ICD-10-CM | POA: Diagnosis not present

## 2017-01-18 ENCOUNTER — Ambulatory Visit
Admission: RE | Admit: 2017-01-18 | Discharge: 2017-01-18 | Disposition: A | Discharge status: Home / Self Care | Payer: PPO | Source: Ambulatory Visit | Attending: Family Medicine | Admitting: Family Medicine

## 2017-01-18 DIAGNOSIS — R9389 Abnormal findings on diagnostic imaging of other specified body structures: Secondary | ICD-10-CM

## 2017-01-18 DIAGNOSIS — R935 Abnormal findings on diagnostic imaging of other abdominal regions, including retroperitoneum: Secondary | ICD-10-CM | POA: Diagnosis not present

## 2017-01-19 LAB — CUP PACEART REMOTE DEVICE CHECK
Date Time Interrogation Session: 20180529203829
Implantable Pulse Generator Implant Date: 20170504

## 2017-01-19 NOTE — Progress Notes (Signed)
Carelink Summary Report 

## 2017-01-24 ENCOUNTER — Telehealth: Payer: Self-pay | Admitting: Family Medicine

## 2017-01-24 DIAGNOSIS — M549 Dorsalgia, unspecified: Secondary | ICD-10-CM

## 2017-01-24 NOTE — Telephone Encounter (Signed)
Pts wife called to report that the prednisone did help some but as soon as the pt stopped taking it the pain returned.  She said that they were instructed to call and report how he was feeling.  She also wanted to know what the report said and what to do next.  Please call them to discuss

## 2017-01-25 NOTE — Telephone Encounter (Signed)
Patient's wife notified as instructed by telephone and verbalized understanding. Patient's wife stated that he will go to the ortho clinic. Advised Mrs. Jablonowski that one of the referral coordinators will be in touch to get this set up for him. Patient's wife stated that he is not having any more testicular sx.

## 2017-01-25 NOTE — Telephone Encounter (Signed)
About the u/s- No definite testicular mass lesion identified. He could have had some inflammatory changes prev, but unless he is having recurrent testicular sx then no f/u needed.  We need to get him set up with the ortho clinic about his pain and continue tramadol prn in the meantime.  Is he willing to see ortho?  Let me know.  Thanks.

## 2017-01-25 NOTE — Telephone Encounter (Signed)
Ordered, noted, thanks.

## 2017-01-28 ENCOUNTER — Other Ambulatory Visit: Payer: Self-pay | Admitting: Neurology

## 2017-01-28 DIAGNOSIS — G2581 Restless legs syndrome: Secondary | ICD-10-CM

## 2017-02-01 ENCOUNTER — Ambulatory Visit (INDEPENDENT_AMBULATORY_CARE_PROVIDER_SITE_OTHER): Payer: PPO | Admitting: Family Medicine

## 2017-02-01 ENCOUNTER — Encounter: Payer: Self-pay | Admitting: Family Medicine

## 2017-02-01 DIAGNOSIS — R05 Cough: Secondary | ICD-10-CM | POA: Diagnosis not present

## 2017-02-01 DIAGNOSIS — R059 Cough, unspecified: Secondary | ICD-10-CM

## 2017-02-01 MED ORDER — GUAIFENESIN 100 MG/5ML PO SOLN: 5.0000 mL | Freq: Four times a day (QID) | ORAL | Status: DC | PRN

## 2017-02-01 MED ORDER — DOXYCYCLINE HYCLATE 100 MG PO TABS: 100.0000 mg | ORAL_TABLET | Freq: Two times a day (BID) | ORAL | 0 refills | Status: DC

## 2017-02-01 NOTE — Patient Instructions (Addendum)
Sunburn caution.  Start doxycycline.  Rest and fluids.   Update me as needed.  Try robitussin in the meantime.  Take care.  Glad to see you.

## 2017-02-01 NOTE — Progress Notes (Signed)
Cough.  Going on for about 2-3 weeks.  Initially thought it was allergies but zyrtec didn't help.  Some sputum, rhinorrhea, not discolored.  Possible that abd pain is due to cough.  occ vomiting, but that is rare and likely diet related, d/w pt.  No fevers, hasn't felt hot.  Still on tramadol for knee pain.  His RLS is better.  He still has some back pain.    Meds, vitals, and allergies reviewed.   ROS: Per HPI unless specifically indicated in ROS section   GEN: nad, alert and oriented HEENT: mucous membranes moist, tm w/o erythema, nasal exam w/o erythema, clear discharge noted,  OP with cobblestoning NECK: supple w/o LA CV: rrr.   PULM: ctab, no inc wob EXT: no edema SKIN: no acute rash

## 2017-02-03 DIAGNOSIS — R05 Cough: Secondary | ICD-10-CM | POA: Insufficient documentation

## 2017-02-03 DIAGNOSIS — R059 Cough, unspecified: Secondary | ICD-10-CM | POA: Insufficient documentation

## 2017-02-03 NOTE — Assessment & Plan Note (Signed)
Given the duration, would treat. Nontoxic. Okay for outpatient follow-up. Start doxycycline.  Rest and fluids.   Update me as needed.  Try robitussin in the meantime.  He agrees.

## 2017-02-04 ENCOUNTER — Other Ambulatory Visit: Payer: Self-pay | Admitting: Neurology

## 2017-02-04 DIAGNOSIS — G2581 Restless legs syndrome: Secondary | ICD-10-CM

## 2017-02-06 ENCOUNTER — Other Ambulatory Visit: Payer: Self-pay

## 2017-02-06 DIAGNOSIS — G2581 Restless legs syndrome: Secondary | ICD-10-CM

## 2017-02-06 NOTE — Telephone Encounter (Signed)
Dr. Roda ShuttersXu you told pt to get future refills with PCP at last visit. Do you want to refill one more time?

## 2017-02-06 NOTE — Telephone Encounter (Signed)
Pharmacy faxes request for refills on patient's Ropinirole HCL 1mg  tabs.  Last OV: acute on 02/01/17, routine 01/10/17 doing well on requip Last Refill:  #30 on 02/06/17 by Dr. Roda ShuttersXu who asks for future r/x's to be forwarded to patient's PCP.  Will refill today for refills #30, 5 refills.

## 2017-02-07 MED ORDER — ROPINIROLE HCL 1 MG PO TABS: 1.0000 mg | ORAL_TABLET | Freq: Every day | ORAL | 5 refills | Status: DC

## 2017-02-07 NOTE — Telephone Encounter (Signed)
Agree, thanks.  Sent.

## 2017-02-08 DIAGNOSIS — M545 Low back pain: Secondary | ICD-10-CM | POA: Diagnosis not present

## 2017-02-08 DIAGNOSIS — M25551 Pain in right hip: Secondary | ICD-10-CM | POA: Diagnosis not present

## 2017-02-16 ENCOUNTER — Ambulatory Visit (INDEPENDENT_AMBULATORY_CARE_PROVIDER_SITE_OTHER): Payer: PPO | Admitting: *Deleted

## 2017-02-16 DIAGNOSIS — H353231 Exudative age-related macular degeneration, bilateral, with active choroidal neovascularization: Secondary | ICD-10-CM | POA: Diagnosis not present

## 2017-02-16 DIAGNOSIS — I63429 Cerebral infarction due to embolism of unspecified anterior cerebral artery: Secondary | ICD-10-CM

## 2017-02-17 ENCOUNTER — Other Ambulatory Visit: Payer: Self-pay | Admitting: Family Medicine

## 2017-02-17 NOTE — Progress Notes (Signed)
Carelink Summary Report / Loop Recorder 

## 2017-02-25 ENCOUNTER — Other Ambulatory Visit: Payer: Self-pay | Admitting: Family Medicine

## 2017-02-27 ENCOUNTER — Other Ambulatory Visit: Payer: Self-pay | Admitting: Family Medicine

## 2017-02-27 DIAGNOSIS — G2581 Restless legs syndrome: Secondary | ICD-10-CM

## 2017-02-27 DIAGNOSIS — E538 Deficiency of other specified B group vitamins: Secondary | ICD-10-CM

## 2017-02-27 DIAGNOSIS — I10 Essential (primary) hypertension: Secondary | ICD-10-CM

## 2017-02-27 LAB — CUP PACEART REMOTE DEVICE CHECK
Date Time Interrogation Session: 20180628203907
Implantable Pulse Generator Implant Date: 20170504

## 2017-02-27 NOTE — Telephone Encounter (Signed)
Please call in.  Thanks.   

## 2017-02-27 NOTE — Telephone Encounter (Signed)
Electronic refill request. Tramadol Last office visit:   02/01/17 Last Filled:    30 tablet 2 11/08/2016  Please advise.

## 2017-02-27 NOTE — Telephone Encounter (Signed)
Medication phoned to pharmacy.  

## 2017-02-28 ENCOUNTER — Other Ambulatory Visit (INDEPENDENT_AMBULATORY_CARE_PROVIDER_SITE_OTHER): Payer: PPO

## 2017-02-28 DIAGNOSIS — G2581 Restless legs syndrome: Secondary | ICD-10-CM | POA: Diagnosis not present

## 2017-02-28 DIAGNOSIS — E538 Deficiency of other specified B group vitamins: Secondary | ICD-10-CM

## 2017-02-28 DIAGNOSIS — I10 Essential (primary) hypertension: Secondary | ICD-10-CM | POA: Diagnosis not present

## 2017-02-28 LAB — IBC PANEL
Iron: 125 ug/dL (ref 42–165)
Saturation Ratios: 27.4 % (ref 20.0–50.0)
Transferrin: 326 mg/dL (ref 212.0–360.0)

## 2017-02-28 LAB — CBC WITH DIFFERENTIAL/PLATELET
Basophils Absolute: 0 10*3/uL (ref 0.0–0.1)
Basophils Relative: 0.7 % (ref 0.0–3.0)
Eosinophils Absolute: 0.2 10*3/uL (ref 0.0–0.7)
Eosinophils Relative: 3.3 % (ref 0.0–5.0)
HCT: 43.6 % (ref 39.0–52.0)
Hemoglobin: 14.4 g/dL (ref 13.0–17.0)
Lymphocytes Relative: 25.8 % (ref 12.0–46.0)
Lymphs Abs: 1.6 10*3/uL (ref 0.7–4.0)
MCHC: 33 g/dL (ref 30.0–36.0)
MCV: 88.7 fl (ref 78.0–100.0)
Monocytes Absolute: 0.6 10*3/uL (ref 0.1–1.0)
Monocytes Relative: 9.2 % (ref 3.0–12.0)
Neutro Abs: 3.8 10*3/uL (ref 1.4–7.7)
Neutrophils Relative %: 61 % (ref 43.0–77.0)
Platelets: 243 10*3/uL (ref 150.0–400.0)
RBC: 4.91 Mil/uL (ref 4.22–5.81)
RDW: 13.5 % (ref 11.5–15.5)
WBC: 6.2 10*3/uL (ref 4.0–10.5)

## 2017-02-28 LAB — LIPID PANEL
Cholesterol: 151 mg/dL (ref 0–200)
HDL: 62.6 mg/dL (ref >39.00)
LDL Cholesterol: 74 mg/dL (ref 0–99)
NonHDL: 88.71
Total CHOL/HDL Ratio: 2
Triglycerides: 74 mg/dL (ref 0.0–149.0)
VLDL: 14.8 mg/dL (ref 0.0–40.0)

## 2017-02-28 LAB — COMPREHENSIVE METABOLIC PANEL
ALT: 11 U/L (ref 0–53)
AST: 19 U/L (ref 0–37)
Albumin: 3.9 g/dL (ref 3.5–5.2)
Alkaline Phosphatase: 64 U/L (ref 39–117)
BUN: 14 mg/dL (ref 6–23)
CO2: 27 mEq/L (ref 19–32)
Calcium: 9.6 mg/dL (ref 8.4–10.5)
Chloride: 102 mEq/L (ref 96–112)
Creatinine, Ser: 1.1 mg/dL (ref 0.40–1.50)
GFR: 66.94 mL/min (ref >60.00)
Glucose, Bld: 100 mg/dL — ABNORMAL HIGH (ref 70–99)
Potassium: 3.3 mEq/L — ABNORMAL LOW (ref 3.5–5.1)
Sodium: 138 mEq/L (ref 135–145)
Total Bilirubin: 0.6 mg/dL (ref 0.2–1.2)
Total Protein: 6.9 g/dL (ref 6.0–8.3)

## 2017-02-28 LAB — VITAMIN B12: Vitamin B-12: 632 pg/mL (ref 211–911)

## 2017-03-01 DIAGNOSIS — H353211 Exudative age-related macular degeneration, right eye, with active choroidal neovascularization: Secondary | ICD-10-CM | POA: Diagnosis not present

## 2017-03-01 DIAGNOSIS — H353221 Exudative age-related macular degeneration, left eye, with active choroidal neovascularization: Secondary | ICD-10-CM | POA: Diagnosis not present

## 2017-03-02 ENCOUNTER — Other Ambulatory Visit: Payer: Self-pay | Admitting: Internal Medicine

## 2017-03-02 ENCOUNTER — Encounter: Payer: Self-pay | Admitting: Family Medicine

## 2017-03-02 ENCOUNTER — Ambulatory Visit (INDEPENDENT_AMBULATORY_CARE_PROVIDER_SITE_OTHER): Payer: PPO | Admitting: Family Medicine

## 2017-03-02 VITALS — BP 130/70 | HR 48 | Temp 97.6°F | Wt 153.5 lb

## 2017-03-02 DIAGNOSIS — D509 Iron deficiency anemia, unspecified: Secondary | ICD-10-CM | POA: Diagnosis not present

## 2017-03-02 DIAGNOSIS — E611 Iron deficiency: Secondary | ICD-10-CM | POA: Diagnosis not present

## 2017-03-02 DIAGNOSIS — J309 Allergic rhinitis, unspecified: Secondary | ICD-10-CM

## 2017-03-02 DIAGNOSIS — E538 Deficiency of other specified B group vitamins: Secondary | ICD-10-CM | POA: Diagnosis not present

## 2017-03-02 DIAGNOSIS — E782 Mixed hyperlipidemia: Secondary | ICD-10-CM

## 2017-03-02 DIAGNOSIS — R0989 Other specified symptoms and signs involving the circulatory and respiratory systems: Secondary | ICD-10-CM | POA: Diagnosis not present

## 2017-03-02 NOTE — Progress Notes (Signed)
Elevated Cholesterol: Using medications without problems:yes Muscle aches: no Diet compliance:yes Exercise: Limited due to joint pain.  Irregular pulse.  Rarely lightheaded. No CP, not SOB.  Pulse irregularity noted incidentally on exam today. He is otherwise asymptomatic. He had not noticed it before today. He does not drink any soda. He only has 1 cup of coffee a day, that is decaf.  Iron deficiency anemia. Has been on iron. Resolved. Labs discussed with patient. No known gross blood loss otherwise.  B12 def, repleted, d/w pt about labs.    PMH and SH reviewed  Meds, vitals, and allergies reviewed.   ROS: Per HPI unless specifically indicated in ROS section   GEN: nad, alert and oriented HEENT: mucous membranes moist NECK: supple w/o LA CV: Regular with occasional ectopy noted. PULM: ctab, no inc wob ABD: soft, +bs EXT: no edema SKIN: no acute rash

## 2017-03-02 NOTE — Patient Instructions (Addendum)
Stop the iron, recheck labs in about 1 month.   Don't change your other meds.  I'll check with cardiology in the meantime.   Take care.  Glad to see you.

## 2017-03-03 ENCOUNTER — Encounter: Payer: Self-pay | Admitting: Family Medicine

## 2017-03-03 DIAGNOSIS — R0989 Other specified symptoms and signs involving the circulatory and respiratory systems: Secondary | ICD-10-CM | POA: Insufficient documentation

## 2017-03-03 NOTE — Assessment & Plan Note (Signed)
Resolved, repleted. Reasonable to stop iron for about 1 month and then recheck labs. It is likely safer to replete him episodically as needed than to pursue an aggressive workup, given his age and other conditions.

## 2017-03-03 NOTE — Assessment & Plan Note (Signed)
Continue current medication. No adverse effect. Labs discussed with patient. He agrees. >25 minutes spent in face to face time with patient, >50% spent in counselling or coordination of care.

## 2017-03-03 NOTE — Assessment & Plan Note (Signed)
With PVCs noted. His pulse is in the 60s on manual count, when he does not have any skipped beats. He is asymptomatic otherwise. He did not notice that he had any pulse irregularity. I am not enthused about changing his medications at this point. I will notify cardiology for input but we did not change his medications at this point. Discussed with patient. He agrees. EKG reviewed with patient.

## 2017-03-03 NOTE — Assessment & Plan Note (Signed)
repleted, d/w pt about labs.

## 2017-03-05 ENCOUNTER — Telehealth: Payer: Self-pay | Admitting: Family Medicine

## 2017-03-05 NOTE — Telephone Encounter (Signed)
-----   Message from Beatrice LecherScott T Weaver, [place] sent at 03/03/2017  1:37 PM EDT ----- I agree with observation. He had a Holter last year with about 8% PVCs.  His EF has been normal.  I reviewed with Dr. Johney FrameAllred who did not think he needed any further intervention.  He also has a loop recorder.  If he is not having palps or dizziness/near syncope, I would continue current meds. Thanks, Acupuncturistcott  ----- Message ----- From: Joaquim Namuncan, Graham S, MD Sent: 03/03/2017   7:08 AM To: Beatrice LecherScott T Weaver, PA-C  With PVCs noted. His pulse is in the 60s on manual count, when he does not have any skipped beats. He is asymptomatic otherwise. He did not notice that he had any pulse irregularity. I am not enthused about changing his medications at this point.  FYI and I need your input.  Thanks

## 2017-03-05 NOTE — Telephone Encounter (Signed)
See below, notify patient. Note from cardiology.  In the absence of any symptoms about the skipped beats, then I would not do anything differently. If he is lightheaded or notices symptoms, and please let cardiology know. Thanks.

## 2017-03-06 NOTE — Telephone Encounter (Signed)
Wife advised. 

## 2017-03-12 IMAGING — CT CT ABD-PELV W/ CM
2 of 5 series · 16 of 46 positions shown, 18 images · IV contrast (Omni 300)
Comparison: 08/14/2015

CLINICAL DATA: Left-sided abdominal pain. History of
diverticulitis.

EXAM:
CT ABDOMEN AND PELVIS WITH CONTRAST
TECHNIQUE: Multidetector CT imaging of the abdomen and pelvis was performed
using the standard protocol following bolus administration of
intravenous contrast.
CONTRAST:  100mL OMNIPAQUE IOHEXOL 300 MG/ML  SOLN

[Series 2: a/p w/ 5mm · axial · 0.89mm/px · z∈[+676,+1150]mm · 13 of 106 slices shown, 15 images]
[im 6/106  soft-tissue]
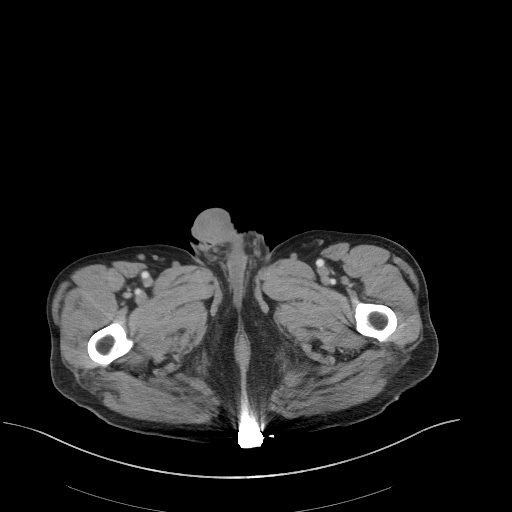
[im 6/106  bone]
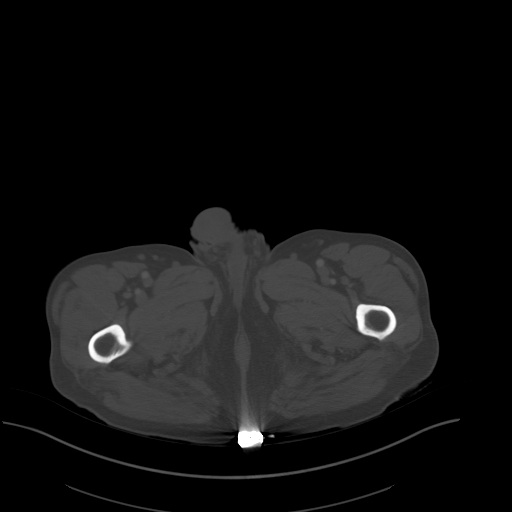
[im 16/106  soft-tissue]
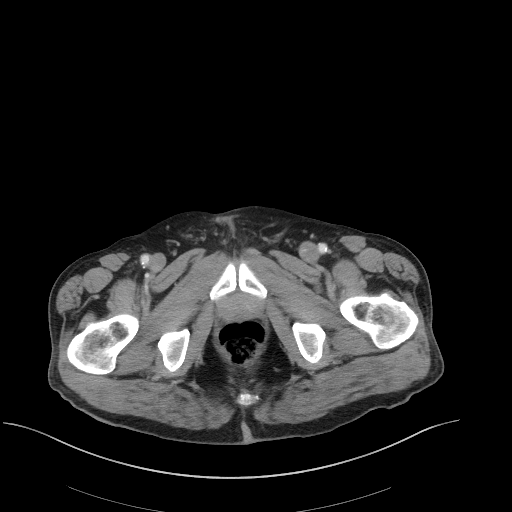
[im 21/106  soft-tissue]
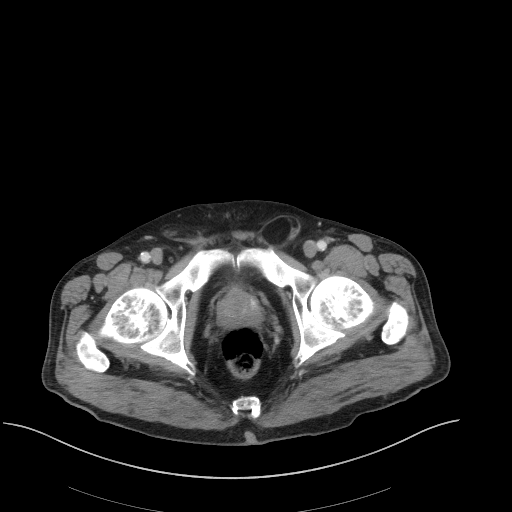
[im 31/106  soft-tissue]
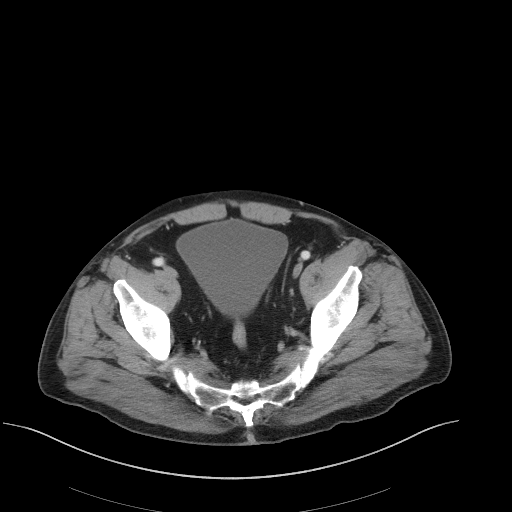
[im 36/106  soft-tissue]
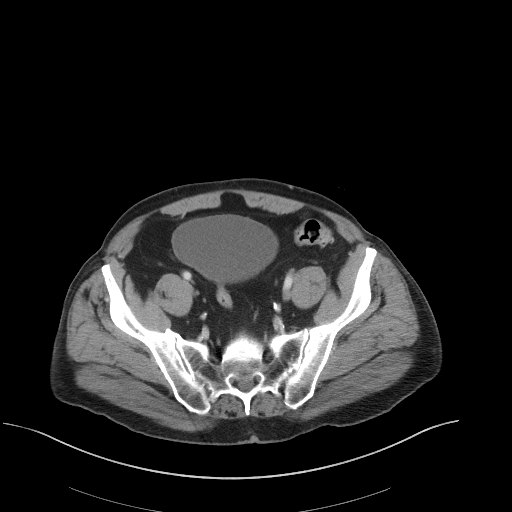
[im 46/106  soft-tissue]
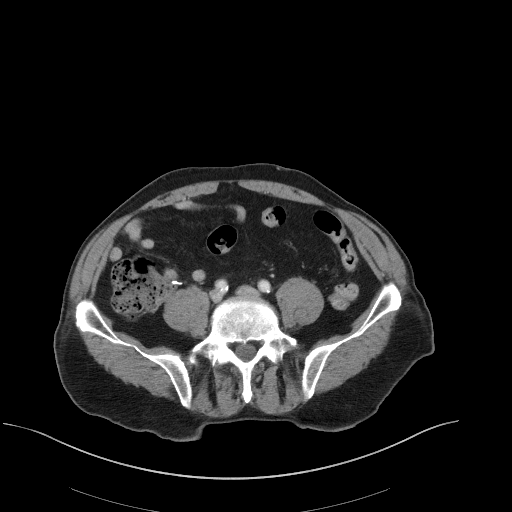
[im 56/106  soft-tissue]
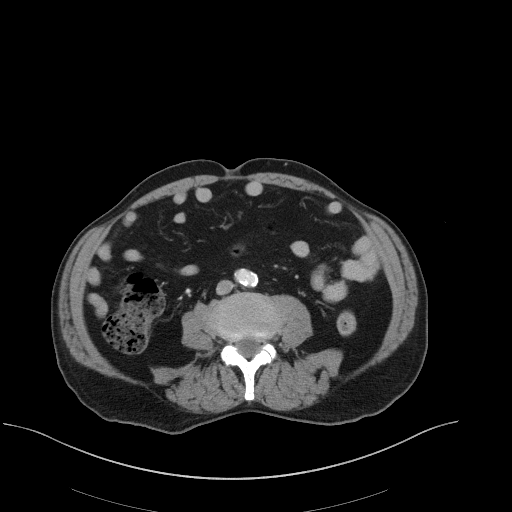
[im 61/106  soft-tissue]
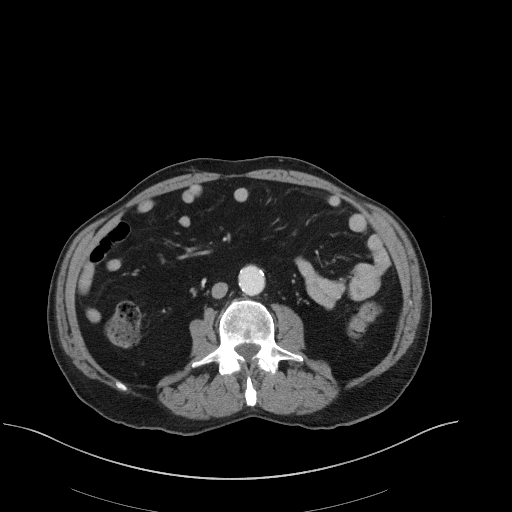
[im 71/106  soft-tissue]
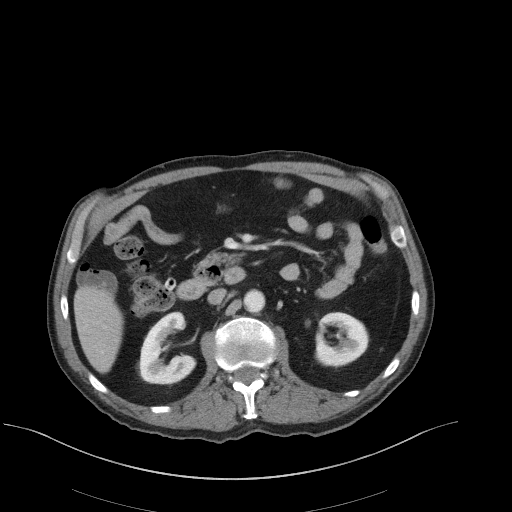
[im 71/106  bone]
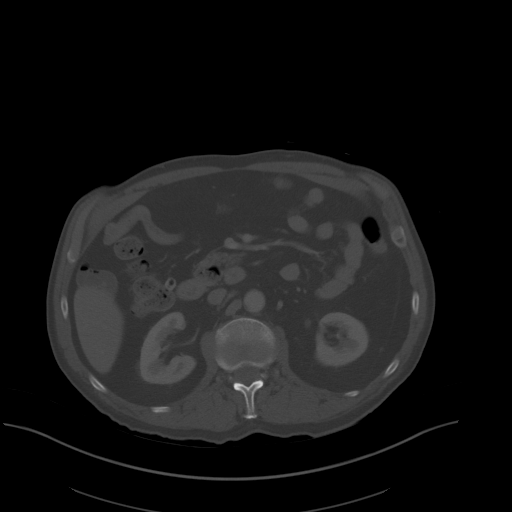
[im 76/106  soft-tissue]
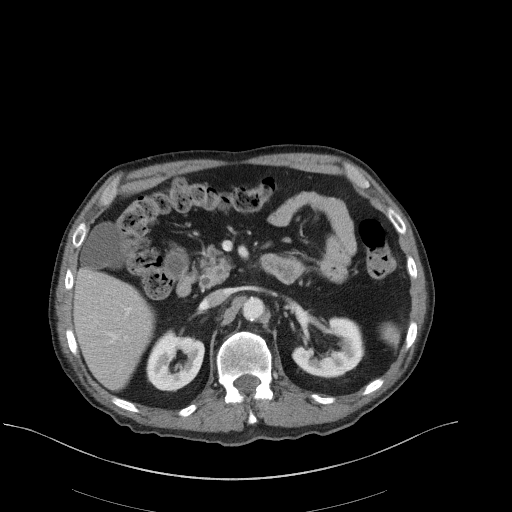
[im 86/106  soft-tissue]
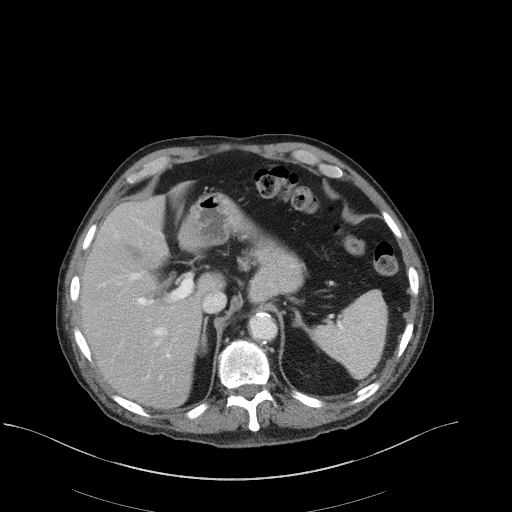
[im 91/106  soft-tissue]
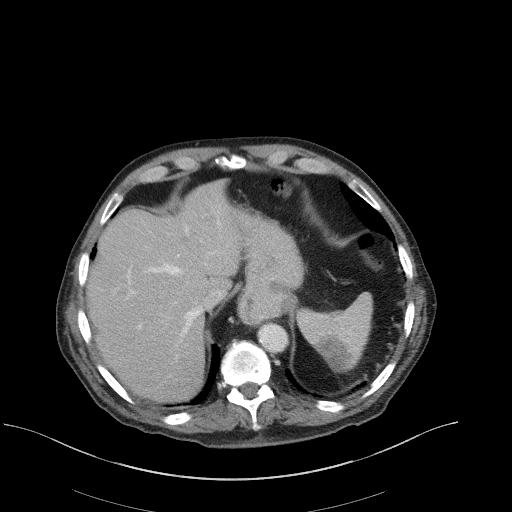
[im 101/106  soft-tissue]
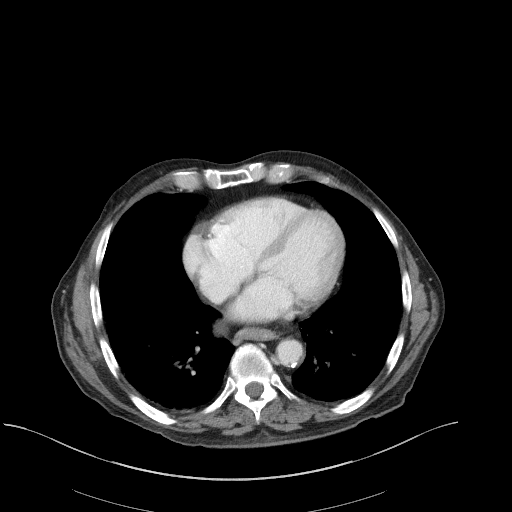

[Series 5: a/p w/ cor · coronal · 0.71mm/px · 3 of 137 slices shown]
[im 46/137  soft-tissue]
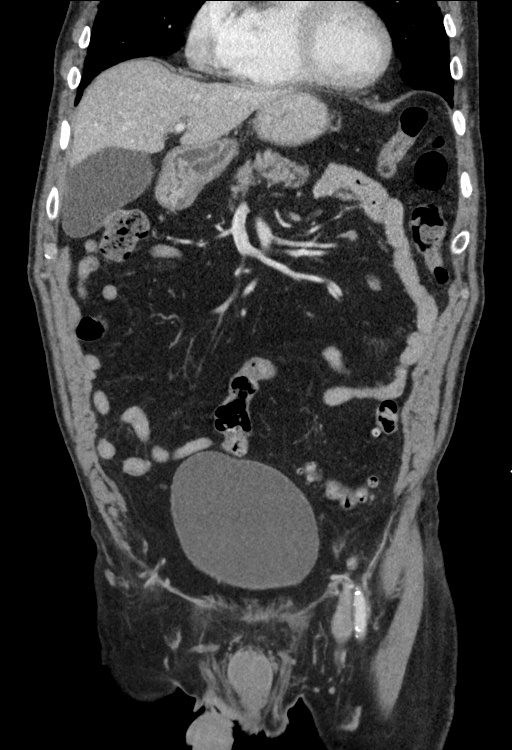
[im 61/137  soft-tissue]
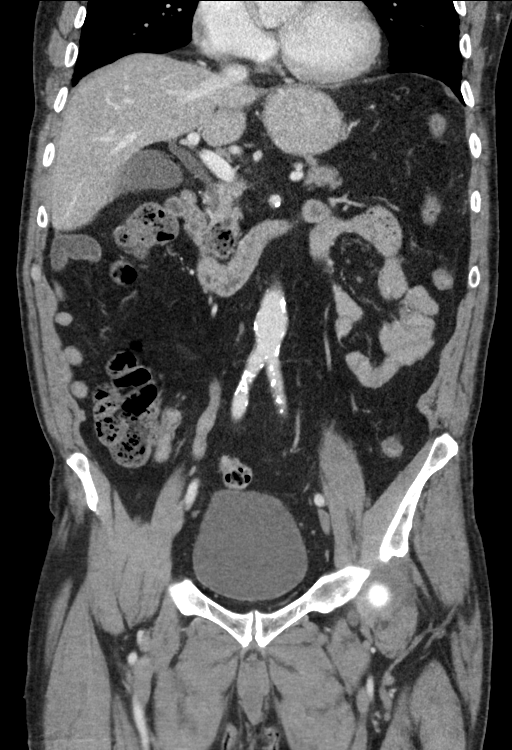
[im 76/137  soft-tissue]
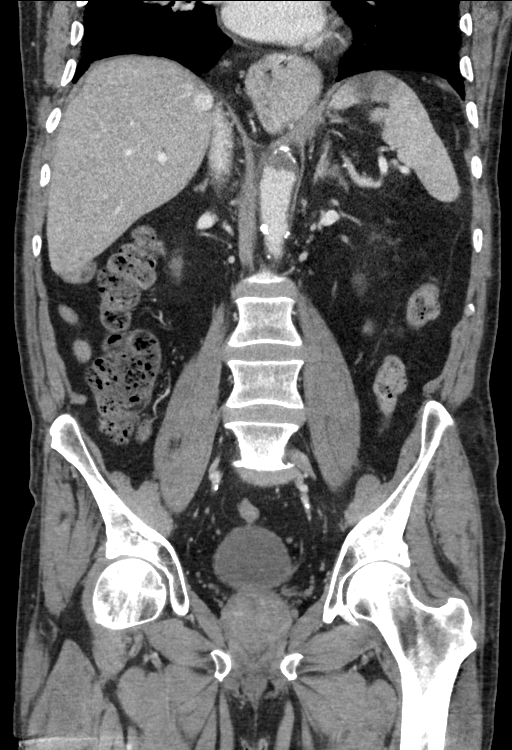

[16 of 46 positions shown; findings below may reference images not displayed]

FINDINGS: Atelectasis in the lung bases. Moderate-sized esophageal hiatal
hernia.

Mild diffuse fatty infiltration of the liver. Focal wedge-shaped
hypo enhancing region in the upper spleen suggesting splenic
infarct. Calcified granulomas in the spleen. The gallbladder,
pancreas, adrenal glands, kidneys, abdominal aorta, inferior vena
cava, and retroperitoneal lymph nodes are unremarkable.

Stomach, small bowel, and colon are not abnormally distended.
Scattered diverticula in the colon. No free air or free fluid in the
abdomen. Periumbilical hernia containing fat.

Pelvis: Scattered diverticula in the sigmoid colon. No evidence of
diverticulitis. Small left inguinal hernia containing fat. No pelvic
mass or lymphadenopathy. Prostate gland is enlarged at 4.9 cm
diameter. Bladder wall is not thickened. No free or loculated pelvic
fluid collections. Surgical absence of the appendix. Degenerative
changes in the spine. No destructive bone lesions.
IMPRESSION: Splenic infarct. Small left inguinal hernia containing fat.
Scattered diverticula in the colon without evidence of
diverticulitis. Moderate-sized esophageal hiatal hernia.

## 2017-03-14 ENCOUNTER — Ambulatory Visit (INDEPENDENT_AMBULATORY_CARE_PROVIDER_SITE_OTHER): Payer: PPO | Admitting: Family Medicine

## 2017-03-14 ENCOUNTER — Encounter: Payer: Self-pay | Admitting: Family Medicine

## 2017-03-14 VITALS — BP 128/50 | HR 64 | Temp 97.5°F | Wt 154.5 lb

## 2017-03-14 DIAGNOSIS — R109 Unspecified abdominal pain: Secondary | ICD-10-CM

## 2017-03-14 LAB — CBC WITH DIFFERENTIAL/PLATELET
Basophils Absolute: 0.1 10*3/uL (ref 0.0–0.1)
Basophils Relative: 1 % (ref 0.0–3.0)
Eosinophils Absolute: 0.3 10*3/uL (ref 0.0–0.7)
Eosinophils Relative: 4.5 % (ref 0.0–5.0)
HCT: 45.5 % (ref 39.0–52.0)
Hemoglobin: 15 g/dL (ref 13.0–17.0)
Lymphocytes Relative: 20.1 % (ref 12.0–46.0)
Lymphs Abs: 1.3 10*3/uL (ref 0.7–4.0)
MCHC: 33 g/dL (ref 30.0–36.0)
MCV: 91.3 fl (ref 78.0–100.0)
Monocytes Absolute: 0.6 10*3/uL (ref 0.1–1.0)
Monocytes Relative: 9.2 % (ref 3.0–12.0)
Neutro Abs: 4.2 10*3/uL (ref 1.4–7.7)
Neutrophils Relative %: 65.2 % (ref 43.0–77.0)
Platelets: 282 10*3/uL (ref 150.0–400.0)
RBC: 4.99 Mil/uL (ref 4.22–5.81)
RDW: 14.4 % (ref 11.5–15.5)
WBC: 6.4 10*3/uL (ref 4.0–10.5)

## 2017-03-14 MED ORDER — AMOXICILLIN-POT CLAVULANATE 875-125 MG PO TABS: 1.0000 | ORAL_TABLET | Freq: Two times a day (BID) | ORAL | 0 refills | Status: DC

## 2017-03-14 NOTE — Patient Instructions (Signed)
Go to the lab on the way out.  We'll contact you with your lab report. Clear liquid diet today and tomorrow.  Hold the antibiotics for now.  If fever, more abdominal pain, or if labs are significantly abnormal, then likely would need to start the antibiotics for presumed diverticulitis.   Take care.  Glad to see you.

## 2017-03-14 NOTE — Progress Notes (Signed)
Abd pain.  He had some atypical foods recently, blackberries, etc. That was about 1 week ago.  The pain started soon thereafter.  No blood in stool.  No fevers.  No vomiting.  Lower midline abd pain.  No burping, no heartburn.  No burning with urination.  No testicle or groin pain. He feels some better this AM.    H/o diverticulitis, this felt similar, but complicated by baseline back pain/treatment that can distract him from the abd pain.   PMH and SH reviewed  ROS: Per HPI unless specifically indicated in ROS section  No fevers or chills. No vomiting. No extremity edema. No dysuria. Not short of breath. No chest pain. No rash. No focal neurologic changes. No blood in stool. No black stools  Meds, vitals, and allergies reviewed.   GEN: nad, alert and oriented HEENT: mucous membranes moist NECK: supple w/o LA CV: rrr PULM: ctab, no inc wob ABD: soft, +bs minimally ttp L of midline in the lower abd, not ttp o/w, no rebound EXT: no edema SKIN: no acute rash

## 2017-03-15 ENCOUNTER — Encounter: Payer: Self-pay | Admitting: Family Medicine

## 2017-03-15 NOTE — Assessment & Plan Note (Signed)
Discussed with patient about options. He feels some better this morning. Given that he feels some better, it would not make sense to put him through another CAT scan. He agrees.  Check CBC today.  Clear liquid diet today and tomorrow.  Hold augmentin for now.  If fever, more abdominal pain, or if labs are significantly abnormal, then likely would need to start the antibiotics for presumed diverticulitis.   Rationale discussed with patient. He agrees. See notes on labs.

## 2017-03-17 ENCOUNTER — Other Ambulatory Visit: Payer: Self-pay | Admitting: *Deleted

## 2017-03-17 NOTE — Patient Outreach (Signed)
[facility]) Care Management  03/17/2017  Martin [name] 02-05-1926 [id number]   Member identified as high risk according to [employer] health questionnaire.  Call placed to introduce [facility] and perform telephone screening.  Member state this is not a good time to talk, request call back.  Will follow up within the next week.  Kemper DurieMonica Bastien Strawser, [place]RN, MSN [facility] Care Management  [facility] Care Manager [phone]

## 2017-03-20 ENCOUNTER — Ambulatory Visit (INDEPENDENT_AMBULATORY_CARE_PROVIDER_SITE_OTHER): Payer: PPO | Admitting: *Deleted

## 2017-03-20 DIAGNOSIS — I63429 Cerebral infarction due to embolism of unspecified anterior cerebral artery: Secondary | ICD-10-CM

## 2017-03-20 NOTE — Progress Notes (Signed)
Carelink Summary Report / Loop Recorder 

## 2017-03-21 ENCOUNTER — Other Ambulatory Visit: Payer: Self-pay | Admitting: *Deleted

## 2017-03-21 ENCOUNTER — Encounter: Payer: Self-pay | Admitting: *Deleted

## 2017-03-21 ENCOUNTER — Other Ambulatory Visit: Payer: Self-pay

## 2017-03-21 MED ORDER — AMOXICILLIN-POT CLAVULANATE 875-125 MG PO TABS: 1.0000 | ORAL_TABLET | Freq: Two times a day (BID) | ORAL | 0 refills | Status: DC

## 2017-03-21 NOTE — Telephone Encounter (Signed)
Sent.  If the abd pain doesn't improve then let me know.  Thanks.

## 2017-03-21 NOTE — Patient Outreach (Signed)
[facility]) Care Management  03/21/2017  Martin IvanJohn P [name] 04/19/1926 [id number]   2nd attempt made to contact member for telephone screening, successful.  Identity confirmed, permission granted to speak with wife.  This care manager introduced self and purpose of call.  He confirms that he does visit his primary MD, Dr. Saintclair HalstedG. Duncan, on a regular basis.  He denies any visits to the ED or admits to the hospital.  He has history of hypertension and stroke, but report that they are both well controlled.  He does monitor his blood pressure (130s/80s normally).  Wife report that he has had a monitor placed by cardiologist over the past month to monitor for possible atrial fibrillation, results were negative.  Report compliance with medications, denies concern needing pharmacy involvement.  He is independent with ADLs/IADLs, denies the need for assistance in the home.  He denies the need for [facility] care [facility] services at this time, is receptive to receiving information in the event there is a need in the future.  Will [facility] information [facility], will not open case/place referral.  Kemper DurieMonica Christia Coaxum, RN, MSN [facility] Care Management  [facility] Care Manager [phone]

## 2017-03-21 NOTE — Telephone Encounter (Signed)
Pt was seen on 03/14/17 and was to hold abx rx to see if pt felt better; pt was going to get rx filled and now cannot find rx or the paperwork given at end of visit. Pt [facility].

## 2017-03-21 NOTE — Telephone Encounter (Signed)
Wife advised. 

## 2017-03-27 ENCOUNTER — Ambulatory Visit (INDEPENDENT_AMBULATORY_CARE_PROVIDER_SITE_OTHER): Payer: PPO | Admitting: Family Medicine

## 2017-03-27 ENCOUNTER — Encounter: Payer: Self-pay | Admitting: Family Medicine

## 2017-03-27 VITALS — BP 132/60 | HR 78 | Temp 97.5°F | Wt 152.0 lb

## 2017-03-27 DIAGNOSIS — N419 Inflammatory disease of prostate, unspecified: Secondary | ICD-10-CM | POA: Diagnosis not present

## 2017-03-27 MED ORDER — CIPROFLOXACIN HCL 500 MG PO TABS: 500.0000 mg | ORAL_TABLET | Freq: Two times a day (BID) | ORAL | 0 refills | Status: DC

## 2017-03-27 NOTE — Progress Notes (Signed)
To recap, patient has had variable amounts of lower abdominal and groin/testicle pain over the last few weeks to months.  He has DJD in his lower back, which could contribute to his sx.  His prev testicle pain had resolved.  Now with no abd pain, no pain with urination but L groin and testicle pain. Variable pain, episodic.  No blood in urine.  Tramadol helps a little with the testicle pain.    He thought his abd pain was getting better on the recent abx course.    Less/no RLS sx now.     Meds, vitals, and allergies reviewed.   ROS: Per HPI unless specifically indicated in ROS section   nad ncat rrr ctab abd soft, not ttp, no suprapubic tenderness.  Testes bilaterally descended without nodularity, tenderness or masses. No scrotal masses or lesions. No penis lesions or urethral discharge. Prostate notably ttp on the DRE.  Exam limited due to pain.  Ext w/o edema.

## 2017-03-27 NOTE — Patient Instructions (Signed)
Stop up front about going to the urology clinic.  Stop augmenting, change to cipro.  Update me in a few days.  Take care.  Glad to see you.

## 2017-03-28 DIAGNOSIS — N419 Inflammatory disease of prostate, unspecified: Secondary | ICD-10-CM | POA: Insufficient documentation

## 2017-03-28 NOTE — Assessment & Plan Note (Signed)
D/w pt about ddx.  Likely that patient has had episodes of different issues- lower back pain, abd pain possibly from colonic source, and now with prostatitis.  He could have had overlap of sx among the dx.  At this point, benign abd, prostate ttp, stop augmentin, change to cipro, refer to uro.  He agrees.  Update me as needed.  PSA not done as would likely be elevated and false positive, d/w pt.  He agrees.    I don't see hernia on exam.  He doesn't have dysuria.  I don't suspect primary testicle pain/issue given the lack of pain on palpation today on exam.

## 2017-03-30 LAB — CUP PACEART REMOTE DEVICE CHECK
Date Time Interrogation Session: 20180728214238
Implantable Pulse Generator Implant Date: 20170504

## 2017-03-30 NOTE — Progress Notes (Signed)
Carelink summary report received. Battery status OK. Normal device function. No new symptom episodes, tachy episodes, brady, or pause episodes. No new AF episodes. Monthly summary reports and ROV/PRN 

## 2017-04-03 ENCOUNTER — Other Ambulatory Visit (INDEPENDENT_AMBULATORY_CARE_PROVIDER_SITE_OTHER): Payer: PPO

## 2017-04-03 ENCOUNTER — Other Ambulatory Visit: Payer: Self-pay | Admitting: Family Medicine

## 2017-04-03 DIAGNOSIS — D509 Iron deficiency anemia, unspecified: Secondary | ICD-10-CM

## 2017-04-03 LAB — CBC WITH DIFFERENTIAL/PLATELET
Basophils Absolute: 0 10*3/uL (ref 0.0–0.1)
Basophils Relative: 0.3 % (ref 0.0–3.0)
Eosinophils Absolute: 0 10*3/uL (ref 0.0–0.7)
Eosinophils Relative: 0.4 % (ref 0.0–5.0)
HCT: 46.4 % (ref 39.0–52.0)
Hemoglobin: 15.4 g/dL (ref 13.0–17.0)
Lymphocytes Relative: 10.7 % — ABNORMAL LOW (ref 12.0–46.0)
Lymphs Abs: 1.1 10*3/uL (ref 0.7–4.0)
MCHC: 33.2 g/dL (ref 30.0–36.0)
MCV: 88.8 fl (ref 78.0–100.0)
Monocytes Absolute: 1 10*3/uL (ref 0.1–1.0)
Monocytes Relative: 9.5 % (ref 3.0–12.0)
Neutro Abs: 8 10*3/uL — ABNORMAL HIGH (ref 1.4–7.7)
Neutrophils Relative %: 79.1 % — ABNORMAL HIGH (ref 43.0–77.0)
Platelets: 250 10*3/uL (ref 150.0–400.0)
RBC: 5.23 Mil/uL (ref 4.22–5.81)
RDW: 13.9 % (ref 11.5–15.5)
WBC: 10.1 10*3/uL (ref 4.0–10.5)

## 2017-04-03 LAB — IRON: Iron: 13 ug/dL — ABNORMAL LOW (ref 42–165)

## 2017-04-05 ENCOUNTER — Other Ambulatory Visit: Payer: Self-pay | Admitting: Family Medicine

## 2017-04-05 DIAGNOSIS — D509 Iron deficiency anemia, unspecified: Secondary | ICD-10-CM

## 2017-04-05 MED ORDER — FERROUS SULFATE 325 (65 FE) MG PO TABS: 325.0000 mg | ORAL_TABLET | Freq: Every day | ORAL | Status: DC

## 2017-04-06 DIAGNOSIS — H353221 Exudative age-related macular degeneration, left eye, with active choroidal neovascularization: Secondary | ICD-10-CM | POA: Diagnosis not present

## 2017-04-06 DIAGNOSIS — H353211 Exudative age-related macular degeneration, right eye, with active choroidal neovascularization: Secondary | ICD-10-CM | POA: Diagnosis not present

## 2017-04-06 DIAGNOSIS — H43813 Vitreous degeneration, bilateral: Secondary | ICD-10-CM | POA: Diagnosis not present

## 2017-04-07 DIAGNOSIS — N411 Chronic prostatitis: Secondary | ICD-10-CM | POA: Diagnosis not present

## 2017-04-07 DIAGNOSIS — R102 Pelvic and perineal pain: Secondary | ICD-10-CM | POA: Diagnosis not present

## 2017-04-17 ENCOUNTER — Ambulatory Visit (INDEPENDENT_AMBULATORY_CARE_PROVIDER_SITE_OTHER): Payer: PPO | Admitting: *Deleted

## 2017-04-17 DIAGNOSIS — I63429 Cerebral infarction due to embolism of unspecified anterior cerebral artery: Secondary | ICD-10-CM

## 2017-04-18 DIAGNOSIS — N50811 Right testicular pain: Secondary | ICD-10-CM | POA: Diagnosis not present

## 2017-04-18 DIAGNOSIS — R1031 Right lower quadrant pain: Secondary | ICD-10-CM | POA: Diagnosis not present

## 2017-04-18 DIAGNOSIS — R278 Other lack of coordination: Secondary | ICD-10-CM | POA: Diagnosis not present

## 2017-04-18 DIAGNOSIS — R102 Pelvic and perineal pain: Secondary | ICD-10-CM | POA: Diagnosis not present

## 2017-04-18 DIAGNOSIS — M62838 Other muscle spasm: Secondary | ICD-10-CM | POA: Diagnosis not present

## 2017-04-18 DIAGNOSIS — M6281 Muscle weakness (generalized): Secondary | ICD-10-CM | POA: Diagnosis not present

## 2017-04-18 NOTE — Progress Notes (Signed)
Carelink Summary Report / Loop Recorder 

## 2017-04-20 LAB — CUP PACEART REMOTE DEVICE CHECK
Date Time Interrogation Session: 20180827213937
Implantable Pulse Generator Implant Date: 20170504

## 2017-04-25 ENCOUNTER — Telehealth: Payer: Self-pay | Admitting: Internal Medicine

## 2017-04-25 NOTE — Telephone Encounter (Signed)
Confirmed that home monitor is connected and working properly.

## 2017-04-25 NOTE — Telephone Encounter (Signed)
New message  Pt wife call to see if transmission was received. Please call back to discuss

## 2017-04-27 ENCOUNTER — Other Ambulatory Visit: Payer: Self-pay

## 2017-04-27 DIAGNOSIS — H353231 Exudative age-related macular degeneration, bilateral, with active choroidal neovascularization: Secondary | ICD-10-CM | POA: Diagnosis not present

## 2017-04-27 MED ORDER — RANITIDINE HCL 150 MG PO TABS: 150.0000 mg | ORAL_TABLET | Freq: Two times a day (BID) | ORAL | 1 refills | Status: DC

## 2017-04-27 MED ORDER — CLOPIDOGREL BISULFATE 75 MG PO TABS: 75.0000 mg | ORAL_TABLET | Freq: Every day | ORAL | 1 refills | Status: DC

## 2017-04-27 MED ORDER — GEMFIBROZIL 600 MG PO TABS: 600.0000 mg | ORAL_TABLET | Freq: Two times a day (BID) | ORAL | 1 refills | Status: DC

## 2017-04-27 MED ORDER — PANTOPRAZOLE SODIUM 40 MG PO TBEC: 40.0000 mg | DELAYED_RELEASE_TABLET | Freq: Every morning | ORAL | 1 refills | Status: DC

## 2017-04-27 MED ORDER — DILTIAZEM HCL ER COATED BEADS 180 MG PO CP24: ORAL_CAPSULE | ORAL | 1 refills | Status: DC

## 2017-04-27 NOTE — Telephone Encounter (Signed)
Mrs Yetta BarreJones (DPR signed) request 90 day refills on plavix, diltiazem, gemfibrozil,pantoprazole [facility] [place] [facility]. 90 day rx is much less expensive than 30 day refill. Refill s done as requested and Mrs Yetta BarreJones voiced understanding. [facility] for these 5 meds.

## 2017-05-03 DIAGNOSIS — R102 Pelvic and perineal pain: Secondary | ICD-10-CM | POA: Diagnosis not present

## 2017-05-03 DIAGNOSIS — M62838 Other muscle spasm: Secondary | ICD-10-CM | POA: Diagnosis not present

## 2017-05-03 DIAGNOSIS — N50811 Right testicular pain: Secondary | ICD-10-CM | POA: Diagnosis not present

## 2017-05-03 DIAGNOSIS — M545 Low back pain: Secondary | ICD-10-CM | POA: Diagnosis not present

## 2017-05-03 DIAGNOSIS — M6281 Muscle weakness (generalized): Secondary | ICD-10-CM | POA: Diagnosis not present

## 2017-05-03 DIAGNOSIS — R1031 Right lower quadrant pain: Secondary | ICD-10-CM | POA: Diagnosis not present

## 2017-05-04 ENCOUNTER — Encounter: Payer: Self-pay | Admitting: Physician Assistant

## 2017-05-04 ENCOUNTER — Ambulatory Visit (INDEPENDENT_AMBULATORY_CARE_PROVIDER_SITE_OTHER): Payer: PPO | Admitting: Physician Assistant

## 2017-05-04 VITALS — BP 130/78 | HR 71 | Ht 65.0 in | Wt 150.4 lb

## 2017-05-04 DIAGNOSIS — I2510 Atherosclerotic heart disease of native coronary artery without angina pectoris: Secondary | ICD-10-CM | POA: Diagnosis not present

## 2017-05-04 DIAGNOSIS — R42 Dizziness and giddiness: Secondary | ICD-10-CM | POA: Diagnosis not present

## 2017-05-04 DIAGNOSIS — Z8673 Personal history of transient ischemic attack (TIA), and cerebral infarction without residual deficits: Secondary | ICD-10-CM

## 2017-05-04 DIAGNOSIS — I493 Ventricular premature depolarization: Secondary | ICD-10-CM | POA: Diagnosis not present

## 2017-05-04 DIAGNOSIS — I10 Essential (primary) hypertension: Secondary | ICD-10-CM | POA: Diagnosis not present

## 2017-05-04 LAB — BASIC METABOLIC PANEL
BUN/Creatinine Ratio: 15 (ref 10–24)
BUN: 16 mg/dL (ref 8–27)
CO2: 24 mmol/L (ref 20–29)
Calcium: 9.2 mg/dL (ref 8.6–10.2)
Chloride: 102 mmol/L (ref 96–106)
Creatinine, Ser: 1.08 mg/dL (ref 0.76–1.27)
GFR calc Af Amer: 70 mL/min/{1.73_m2} (ref >59)
GFR calc non Af Amer: 61 mL/min/{1.73_m2} (ref >59)
Glucose: 103 mg/dL — ABNORMAL HIGH (ref 65–99)
Potassium: 3.5 mmol/L (ref 3.5–5.2)
Sodium: 140 mmol/L (ref 134–144)

## 2017-05-04 NOTE — Patient Instructions (Addendum)
Medication Instructions:  No changes.   Labwork: Today - BMET   Testing/Procedures: None   Follow-Up: Your physician wants you to follow-up in: 6 MONTHS WITH SCOTT WEAVER, [facility]  You will receive a reminder letter in the mail two months in advance. If you don't receive a letter, please call our office to schedule the follow-up appointment.   Any Other Special Instructions Will Be Listed Below (If Applicable). Try to get compression socks and wear when you are on your feet to prevent dizziness. Drink plenty of fluids. Stand slowly.  Pump your calf muscles when seated prior to standing to prevent dizziness. Return sooner if dizziness worsens.   If you need a refill on your cardiac medications before your next appointment, please call your pharmacy.

## 2017-05-04 NOTE — Progress Notes (Signed)
Cardiology Office Note:    Date:  05/04/2017   ID:  [name], DOB 1926/05/19, MRN [id number]  PCP:  Joaquim Namuncan, Graham S, MD  Cardiologist:  Dr. Everette RankJay Varanasi   Electrophysiologist:  Dr. Hillis RangeJames Allred      Referring MD: Joaquim Namuncan, Graham S, MD   Chief Complaint  Patient presents with  . Dizziness    History of Present Illness:    [name] is a 83 y.o. male with a hx of Cryptogenic stroke in 6/16 and splenic infarct status post ILR, HTN, HL, COPD, von Willebrand's disease. Patient was seen by Dr. Johney FrameAllred 5/17 and underwent implantation of Linq ILR.  ILR to date has not demonstrated atrial fibrillation.  He has known atherosclerosis on a prior chest CT.  In 2017, he c/o one episode chest pain and a Nuclear stress test demonstrated inferior ischemia.  Med Rx was recommended unless he had recurrent symptoms.   Last seen in 11/17.  [name] returns for evaluation of dizziness.  He is here with his wife.  He was told by his PCP recently that, b/c of a recent ECG, if he should develop dizziness he should follow up sooner here.  He notes occasional dizziness with standing quickly.  Otherwise he feels well.  He denies syncope, near syncope.  He denies exertional chest pain, dyspnea on exertion, paroxysmal nocturnal dyspnea, edema.  He fell a few weeks ago but tripped on something.  He did not feel dizzy at the time.    Prior CV studies:   The following studies were reviewed today:  Chest CT 04/06/16 IMPRESSION: 1. The nodule questioned on left shoulder films represents a small calcified granuloma within the superior segment of the left lower lobe. There also are small calcified splenic granulomas consistent with prior granulomatous disease. 2. Moderate thoracic aortic atherosclerosis. 3. Calcification of the coronary arteries diffusely. 4. Paraseptal emphysema.  Myoview 03/25/16 Medium size, moderate intensity (SDS 5) reversible inferior perfusion defect which could suggest ischemia. Study  was not gated due to frequent ventricular ectopy. This is an intermediate risk study.  TEE 10/28/15 EF 55%, normal wall motion, grade 4 plaque descending thoracic aorta, trivial MR, normal RVSF, no right-to-left atrial shunt  Event monitor 8/16 Sinus rhythm Rare pacs and pvcs No sustained arrhythmias No afib  Echo 6/16 Mild LVH, EF 85%, normal wall motion, mildly reduced RVSF, PASP 37 mmHg  Past Medical History:  Diagnosis Date  . Back pain    improved with gabapentin as of 2015  . Barrett esophagus 09/2003  . Diverticulitis   . Diverticulosis   . GERD (gastroesophageal reflux disease)   . GI AVM (gastrointestinal arteriovenous vascular malformation)   . Hearing loss   . Hemorrhoids   . Hemorrhoids   . History of Holter monitoring    a. holter 9/17: NSR, PVCs; no sustained arrhythmia; PVC burden 8%  . History of nuclear stress test    a. Myoview 8/17: Intermediate risk, inferior ischemia, not gated  . History of prostatitis   . Hyperlipidemia   . Hypertension   . Macular degeneration, wet (HCC)    receiving intra-ocular injections ([place])  . PUD (peptic ulcer disease)   . Stroke [facility](HCC)     Past Surgical History:  Procedure Laterality Date  . EP IMPLANTABLE DEVICE N/A 12/24/2015   Procedure: Loop Recorder Insertion;  Surgeon: Hillis RangeJames Allred, MD;  Location: [facility] INVASIVE CV LAB;  Service: Cardiovascular;  Laterality: N/A;  . INGUINAL HERNIA REPAIR  2001  .  LAPAROSCOPIC APPENDECTOMY N/A 07/07/2014   Procedure: APPENDECTOMY LAPAROSCOPIC;  Surgeon: Manus Rudd, MD;  Location: [facility] OR;  Service: General;  Laterality: N/A;  . TEE WITHOUT CARDIOVERSION N/A 10/28/2015   Procedure: TRANSESOPHAGEAL ECHOCARDIOGRAM (TEE);  Surgeon: Laurey Morale, MD;  Location: [facility] ENDOSCOPY;  Service: Cardiovascular;  Laterality: N/A;    Current Medications: Current Meds  Medication Sig  . Bevacizumab (AVASTIN IV) Place 1 Applicatorful into both eyes See admin instructions. Right eye every 8  weeks Left eye every 8 weeks  . cetirizine (ZYRTEC) 10 MG tablet Take 10 mg by mouth daily as needed for allergies.   . ciprofloxacin (CIPRO) 500 MG tablet Take 1 tablet (500 mg total) by mouth 2 (two) times daily.  . clopidogrel (PLAVIX) 75 MG tablet Take 1 tablet (75 mg total) by mouth daily.  [name] Kitchen diltiazem (CARDIZEM CD) 180 MG 24 hr capsule TAKE ONE (1) CAPSULE BY MOUTH EACH DAY  . ferrous sulfate 325 (65 FE) MG tablet Take 1 tablet (325 mg total) by mouth daily with breakfast.  . fluticasone (FLONASE) 50 MCG/ACT nasal spray Place 2 sprays into both nostrils daily as needed for allergies.  [name] Kitchen gabapentin (NEURONTIN) 300 MG capsule TAKE ONE (1) CAPSULE BY MOUTH 2 TIMES DAILY  . gemfibrozil (LOPID) 600 MG tablet Take 1 tablet (600 mg total) by mouth 2 (two) times daily.  [name] Kitchen guaiFENesin (ROBITUSSIN) 100 MG/5ML SOLN Take 5 mLs (100 mg total) by mouth every 6 (six) hours as needed for cough.  . hydrochlorothiazide (HYDRODIURIL) 25 MG tablet Take 1 tablet (25 mg total) by mouth daily.  . Hypromellose (ARTIFICIAL TEARS OP) Apply 1 drop to eye daily as needed (itchy / watery eyes).  [name] Kitchen loperamide (IMODIUM) 2 MG capsule Take 1 capsule (2 mg total) by mouth 3 (three) times daily as needed for diarrhea or loose stools.  . meclizine (ANTIVERT) 25 MG tablet Take 0.5 tablets (12.5 mg total) by mouth 3 (three) times daily as needed for dizziness.  . Multiple Vitamins-Minerals (OCUVITE ADULT 50+ PO) Take 1 capsule by mouth daily.    . nitroGLYCERIN (NITROSTAT) 0.4 MG SL tablet Place 1 tablet (0.4 mg total) under the tongue every 5 (five) minutes as needed for chest pain (max 3 doses in 15 minutes).  . pantoprazole (PROTONIX) 40 MG tablet Take 1 tablet (40 mg total) by mouth every morning.  [name] Kitchen PRALUENT 150 MG/ML SOPN INJECT 1 SYRINGE INTO THE SKIN ONCE EVERY 14 DAYS  . ranitidine (ZANTAC) 150 MG tablet Take 1 tablet (150 mg total) by mouth 2 (two) times daily.  [name] Kitchen rOPINIRole (REQUIP) 1 MG tablet Take 1 tablet (1 mg  total) by mouth at bedtime.  . sucralfate (CARAFATE) 1 g tablet TAKE 1 TABLET BY MOUTH BEFORE LAYING DOWN FOR [place] NEEDED  . tamsulosin (FLOMAX) 0.4 MG CAPS capsule Take 0.4 mg by mouth daily.   . traMADol (ULTRAM) 50 MG tablet TAKE ONE TABLET BY MOUTH EVERY EIGHT HOURS AS NEEDED FOR MODERATE TO SEVERE PAIN  . vitamin B-12 (CYANOCOBALAMIN) 1000 MCG tablet Take 2 tablets (2,000 mcg total) by mouth daily.     Allergies:   Atorvastatin; Codeine; Lisinopril; Statins; and Angiotensin receptor blockers   Social History   Social History  . Marital status: Married    Spouse name: [place]  . Number of children: N/A  . Years of education: N/A   Occupational History  . Retired   .  Retired   Social History Main Topics  . Smoking status: Former  Smoker    Packs/day: 0.00    Years: 36.00    Types: Cigarettes  . Smokeless tobacco: Never Used     Comment: Quit in 01/06/1982  . Alcohol use No  . Drug use: No  . Sexual activity: Not Asked   Other Topics Concern  . None   Social History Narrative   6th grade. Married Jan 07, 1951 1 son, 1 dtr - died at 5 months. Work - Management consultant, Engineer, drilling, Catering manager.   Retired-fulltime. "[employer] ACP - yes DNR, yes short-term mechanical ventilarion, no heroic or futile measures in face of irreversible.     Family Hx: The patient's family history includes Arthritis in his sister; Bleeding Disorder in his son; Cancer in his brother and mother; Coronary artery disease in his father; Heart attack in his father; Hyperlipidemia in his sister; Hypertension in his mother; Pancreatic cancer in his mother; Prostate cancer in his brother. There is no history of Diabetes.  ROS:   Please see the history of present illness.    Review of Systems  Constitution: Positive for decreased appetite.  HENT: Positive for hearing loss.   Eyes: Positive for visual disturbance.  Cardiovascular: Positive for irregular heartbeat.  Respiratory: Positive for snoring.    All  other systems reviewed and are negative.   EKGs/Labs/Other Test Reviewed:    EKG:  EKG is  ordered today.  The ekg ordered today demonstrates NSR, HR 71, PVCs, QTc 439 ms , no changes.   Recent Labs: 02/28/2017: ALT 11; BUN 14; Creatinine, Ser 1.10; Potassium 3.3; Sodium 138 04/03/2017: Hemoglobin 15.4; Platelets 250.0   Recent Lipid Panel Lab Results  Component Value Date/Time   CHOL 151 02/28/2017 08:59 AM   TRIG 74.0 02/28/2017 08:59 AM   HDL 62.60 02/28/2017 08:59 AM   CHOLHDL 2 02/28/2017 08:59 AM   LDLCALC 74 02/28/2017 08:59 AM   LDLDIRECT 167.2 08/12/2013 10:24 AM    Physical Exam:    VS:  BP 130/78   Pulse 71   Ht  (1.651 m)   Wt 150 lb 6.4 oz (68.2 kg)   SpO2 96%   BMI 25.03 kg/m     Wt Readings from Last 3 Encounters:  05/04/17 150 lb 6.4 oz (68.2 kg)  03/27/17 152 lb (68.9 kg)  03/14/17 154 lb 8 oz (70.1 kg)     Physical Exam  Constitutional: He is oriented to person, place, and time. He appears well-developed and well-nourished. No distress.  HENT:  Head: Normocephalic and atraumatic.  Eyes: No scleral icterus.  Neck: No JVD present.  Cardiovascular: Normal rate.  An irregular rhythm present.  No murmur heard. Pulmonary/Chest: Effort normal. He has no rales.  Abdominal: Soft. There is no tenderness.  Musculoskeletal: He exhibits no edema.  Neurological: He is alert and oriented to person, place, and time.  Skin: Skin is warm and dry.  Psychiatric: He has a normal mood and affect.    ASSESSMENT:    1. Dizziness   2. Coronary artery disease involving native coronary artery of native heart without angina pectoris   3. PVC's (premature ventricular contractions)   4. History of CVA (cerebrovascular accident)   5. Essential hypertension    PLAN:    In order of problems listed above:  1. Dizziness He mainly has orthostatic intolerance if he stands quickly. Otherwise, he feels fine.  I did a quick check on his BP from lying to standing today  and it did drop from SBP 160 to SBP 140.  I have asked him to wear compression socks, hydrate and to stand slowly.  If he has worsening dizziness, he should follow up sooner. Recent hemoglobin normal. Check BMET today.  2. Coronary artery disease involving native coronary artery of native heart without angina pectoris Coronary Ca on prior CT.  He denies angina.  Continue Clopidogrel, PCSK-9 inhibitor.    3. PVC's (premature ventricular contractions) Holter monitor last year demonstrated 8% PVCs. His ECG is unchanged. Continue current management.  4. History of CVA (cerebrovascular accident) He underwent implantation of ILR. To date, atrial fibrillation has not been identified.  5. Essential hypertension  The patient's blood pressure is controlled on his current regimen.  Continue current therapy.  Check BMET today.  Dispo:  Return in about 6 months (around 11/01/2017) for Routine Follow Up, w/ Tereso Newcomer, PA-C.   Medication Adjustments/Labs and Tests Ordered: Current medicines are reviewed at length with the patient today.  Concerns regarding medicines are outlined above.  Tests Ordered: Orders Placed This Encounter  Procedures  . Basic Metabolic Panel (BMET)  . EKG 12-Lead   Medication Changes: No orders of the defined types were placed in this encounter.   Signed, Tereso Newcomer, PA-C  05/04/2017 2:05 PM    [facility] [address] [place], [place], [place]  [address] Phone: [phone]; Fax: [phone]

## 2017-05-05 ENCOUNTER — Ambulatory Visit: Payer: PPO | Admitting: Physician Assistant

## 2017-05-05 ENCOUNTER — Telehealth: Payer: Self-pay | Admitting: *Deleted

## 2017-05-05 DIAGNOSIS — E876 Hypokalemia: Secondary | ICD-10-CM

## 2017-05-05 MED ORDER — POTASSIUM CHLORIDE ER 10 MEQ PO TBCR: 10.0000 meq | EXTENDED_RELEASE_TABLET | Freq: Every day | ORAL | 3 refills | Status: DC

## 2017-05-05 NOTE — Telephone Encounter (Signed)
-----   Message from Beatrice Lecher, [place] sent at 05/05/2017  9:08 AM EDT ----- Please call the patient Kidney function is normal. The potassium is normal but at the lower limit of normal. Start K+ 10 mEq QD BMET 2 weeks. Tereso Newcomer, PA-C    05/05/2017 9:08 AM

## 2017-05-05 NOTE — Telephone Encounter (Signed)
DPR ok to s/w pt's wife who has been notified of lab results for the pt by phone with verbal understanding. Wife is agreeable to plan of care to start K+ 10 meq daily, Rx has been sent to [facility]. BMET 05/19/17. Pt's wife thanked me for my call today.

## 2017-05-09 ENCOUNTER — Other Ambulatory Visit (INDEPENDENT_AMBULATORY_CARE_PROVIDER_SITE_OTHER): Payer: PPO

## 2017-05-09 DIAGNOSIS — D509 Iron deficiency anemia, unspecified: Secondary | ICD-10-CM

## 2017-05-09 LAB — CBC WITH DIFFERENTIAL/PLATELET
Basophils Absolute: 0 10*3/uL (ref 0.0–0.1)
Basophils Relative: 0.6 % (ref 0.0–3.0)
Eosinophils Absolute: 0.2 10*3/uL (ref 0.0–0.7)
Eosinophils Relative: 3 % (ref 0.0–5.0)
HCT: 44.1 % (ref 39.0–52.0)
Hemoglobin: 14.4 g/dL (ref 13.0–17.0)
Lymphocytes Relative: 20.1 % (ref 12.0–46.0)
Lymphs Abs: 1.5 10*3/uL (ref 0.7–4.0)
MCHC: 32.6 g/dL (ref 30.0–36.0)
MCV: 89.9 fl (ref 78.0–100.0)
Monocytes Absolute: 0.6 10*3/uL (ref 0.1–1.0)
Monocytes Relative: 8.5 % (ref 3.0–12.0)
Neutro Abs: 4.9 10*3/uL (ref 1.4–7.7)
Neutrophils Relative %: 67.8 % (ref 43.0–77.0)
Platelets: 367 10*3/uL (ref 150.0–400.0)
RBC: 4.91 Mil/uL (ref 4.22–5.81)
RDW: 13.6 % (ref 11.5–15.5)
WBC: 7.3 10*3/uL (ref 4.0–10.5)

## 2017-05-09 LAB — IBC PANEL
Iron: 51 ug/dL (ref 42–165)
Saturation Ratios: 11.8 % — ABNORMAL LOW (ref 20.0–50.0)
Transferrin: 309 mg/dL (ref 212.0–360.0)

## 2017-05-12 DIAGNOSIS — R102 Pelvic and perineal pain: Secondary | ICD-10-CM | POA: Diagnosis not present

## 2017-05-12 DIAGNOSIS — M62838 Other muscle spasm: Secondary | ICD-10-CM | POA: Diagnosis not present

## 2017-05-12 DIAGNOSIS — N50811 Right testicular pain: Secondary | ICD-10-CM | POA: Diagnosis not present

## 2017-05-12 DIAGNOSIS — M545 Low back pain: Secondary | ICD-10-CM | POA: Diagnosis not present

## 2017-05-12 DIAGNOSIS — M6281 Muscle weakness (generalized): Secondary | ICD-10-CM | POA: Diagnosis not present

## 2017-05-17 ENCOUNTER — Ambulatory Visit (INDEPENDENT_AMBULATORY_CARE_PROVIDER_SITE_OTHER): Payer: PPO | Admitting: *Deleted

## 2017-05-17 DIAGNOSIS — I63429 Cerebral infarction due to embolism of unspecified anterior cerebral artery: Secondary | ICD-10-CM | POA: Diagnosis not present

## 2017-05-18 NOTE — Progress Notes (Signed)
Carelink Summary Report / Loop Recorder 

## 2017-05-19 ENCOUNTER — Other Ambulatory Visit: Payer: PPO | Admitting: *Deleted

## 2017-05-19 ENCOUNTER — Telehealth: Payer: Self-pay | Admitting: *Deleted

## 2017-05-19 DIAGNOSIS — E876 Hypokalemia: Secondary | ICD-10-CM | POA: Diagnosis not present

## 2017-05-19 LAB — CUP PACEART REMOTE DEVICE CHECK
Date Time Interrogation Session: 20180926221337
Implantable Pulse Generator Implant Date: 20170504

## 2017-05-19 LAB — BASIC METABOLIC PANEL
BUN/Creatinine Ratio: 16 (ref 10–24)
BUN: 16 mg/dL (ref 8–27)
CO2: 21 mmol/L (ref 20–29)
Calcium: 9 mg/dL (ref 8.6–10.2)
Chloride: 103 mmol/L (ref 96–106)
Creatinine, Ser: 1.03 mg/dL (ref 0.76–1.27)
GFR calc Af Amer: 74 mL/min/{1.73_m2} (ref >59)
GFR calc non Af Amer: 64 mL/min/{1.73_m2} (ref >59)
Glucose: 109 mg/dL — ABNORMAL HIGH (ref 65–99)
Potassium: 3.9 mmol/L (ref 3.5–5.2)
Sodium: 140 mmol/L (ref 134–144)

## 2017-05-19 NOTE — Telephone Encounter (Signed)
-----   Message from Beatrice Lecher, [place] sent at 05/19/2017  4:51 PM EDT ----- Please call patient: The kidney function (BUN, Creatinine) and potassium are normal. All other parameters are within acceptable limits and no further intervention or testing required. Continue with current treatment plan. Tereso Newcomer, PA-C    05/19/2017 4:51 PM

## 2017-05-19 NOTE — Telephone Encounter (Signed)
DPR ok to s/w pt's wife. Pt's wife has been notified of lab results by phone with verbal understanding. Pt's wife thanked me for my call.

## 2017-05-22 ENCOUNTER — Ambulatory Visit (INDEPENDENT_AMBULATORY_CARE_PROVIDER_SITE_OTHER): Payer: PPO | Admitting: *Deleted

## 2017-05-22 DIAGNOSIS — Z23 Encounter for immunization: Secondary | ICD-10-CM

## 2017-05-24 ENCOUNTER — Other Ambulatory Visit: Payer: Self-pay | Admitting: Family Medicine

## 2017-05-24 NOTE — Telephone Encounter (Signed)
Last refill 02/27/17 #60 +2  Last OV 03/27/17 Ok to refill?

## 2017-05-25 NOTE — Telephone Encounter (Signed)
Please call in.  Thanks.   

## 2017-05-25 NOTE — Telephone Encounter (Signed)
Medication phoned to pharmacy.  

## 2017-05-30 DIAGNOSIS — H353221 Exudative age-related macular degeneration, left eye, with active choroidal neovascularization: Secondary | ICD-10-CM | POA: Diagnosis not present

## 2017-05-30 DIAGNOSIS — H353211 Exudative age-related macular degeneration, right eye, with active choroidal neovascularization: Secondary | ICD-10-CM | POA: Diagnosis not present

## 2017-05-31 DIAGNOSIS — N50811 Right testicular pain: Secondary | ICD-10-CM | POA: Diagnosis not present

## 2017-05-31 DIAGNOSIS — M545 Low back pain: Secondary | ICD-10-CM | POA: Diagnosis not present

## 2017-05-31 DIAGNOSIS — M62838 Other muscle spasm: Secondary | ICD-10-CM | POA: Diagnosis not present

## 2017-05-31 DIAGNOSIS — M6281 Muscle weakness (generalized): Secondary | ICD-10-CM | POA: Diagnosis not present

## 2017-05-31 DIAGNOSIS — R102 Pelvic and perineal pain: Secondary | ICD-10-CM | POA: Diagnosis not present

## 2017-06-16 ENCOUNTER — Ambulatory Visit (INDEPENDENT_AMBULATORY_CARE_PROVIDER_SITE_OTHER): Payer: PPO | Admitting: *Deleted

## 2017-06-16 DIAGNOSIS — I63429 Cerebral infarction due to embolism of unspecified anterior cerebral artery: Secondary | ICD-10-CM

## 2017-06-19 ENCOUNTER — Encounter: Payer: Self-pay | Admitting: Family Medicine

## 2017-06-19 ENCOUNTER — Ambulatory Visit (INDEPENDENT_AMBULATORY_CARE_PROVIDER_SITE_OTHER): Payer: PPO | Admitting: Family Medicine

## 2017-06-19 DIAGNOSIS — K5792 Diverticulitis of intestine, part unspecified, without perforation or abscess without bleeding: Secondary | ICD-10-CM

## 2017-06-19 MED ORDER — AMOXICILLIN-POT CLAVULANATE 875-125 MG PO TABS: 1.0000 | ORAL_TABLET | Freq: Two times a day (BID) | ORAL | 0 refills | Status: DC

## 2017-06-19 NOTE — Patient Instructions (Signed)
Presumed mild diverticulitis.  Start augmentin.  No fiber/salads/fruit/bran for now.  Clear liquid diet.  Update me if not getting better.  Take care.  Glad to see you.

## 2017-06-19 NOTE — Progress Notes (Signed)
No vomiting, no diarrhea.  He has abd pain.  H/o diverticulitis, in the past and that was similar.  Sx started about 2 days ago.  Pain is variable but doesn't fully resolve, in the lower abd.  No fevers.  No blood in stool.  Normal UOP.  He has some mildly black stools at baseline with iron use and that is not changed.  Last BM was yesterday AM.   Some bloating with the flares of pain.    Same diet as his wife, and she isn't sick.    Meds, vitals, and allergies reviewed.   ROS: Per HPI unless specifically indicated in ROS section   nad ncat Neck supple, no LA rrr with occ ectopy noted, not tachy ctab LLQ ttp w/o rebound, normal BS, soft abd Ext w/o edema.

## 2017-06-19 NOTE — Progress Notes (Signed)
Carelink Summary Report / Loop Recorder 

## 2017-06-20 ENCOUNTER — Other Ambulatory Visit: Payer: Self-pay | Admitting: *Deleted

## 2017-06-20 ENCOUNTER — Encounter: Payer: Self-pay | Admitting: *Deleted

## 2017-06-20 NOTE — Patient Outreach (Signed)
[facility] Care Management  06/20/2017  [name] 1926/02/03 [id number]  Telephone Screen  Referral Date: 06/20/17 Referral Source: Nurse Call Center Referral Reason: Requesting information on a bland diet. Insurance:HTA   Outreach telephone call to patient. HIPAA identifiers verified with spouse [facility](EMMA). [name] stated, patient was unavailable to speak on the phone. She stated, "He was doing something outside, where he loves to be". [name] confirmed contacting the nurse call center to request information regarding a bland diet. She stated, the patient had an appointment with his primary MD on 06/19/17. Patient and [name] discussed patient's abdominal pain with the doctor. The MD related the abdominal pain to mild diverticulitis, per EMR. The MD prescribed a bland diet and antibiotics for the patient to assist with alleviating patient's pain. Spouse was given information by the nurse call center, which she considered to be helpful. RN CM discussed with spouse various bland foods and the importance of eating small frequent meals. Spouse stated, "Patient eats all the time and he eats very large meals each time". Per spouse, patient has a history of acid reflux, which limits certain foods patient can consume. Spouse named milk and eggs as patient's most favorite food items. Spouse wants patient's pain to improve and she plans to assist patient with adhering to a bland diet. Patient agrees with the doctor's plan. Per EMR, if patient's pain doesn't decrease, he needs to contact the MD office. [name] is knowledgeable about which doctor should be called for an emergent/non-emergent needs.   Plan: RN CM will send patient EMMI educational materials. RN CM will notify [facility] Case Management Assistant regarding case closure.   Wynelle ClevelandJuanita Ely Ballen, RN, BSN, MHA/MSL, [facility] Telephonic Care Manager Coordinator [facility] Phone: [phone] Toll Free: [phone] Fax:  [phone]

## 2017-06-20 NOTE — Assessment & Plan Note (Signed)
Presumed mild diverticulitis.  Start augmentin.  No fiber/salads/fruit/bran for now.  Clear liquid diet.  Update me if not getting better.  Okay for outpatient follow-up. Defer imaging at this point. He agrees. Rationale discussed with patient.

## 2017-06-22 ENCOUNTER — Other Ambulatory Visit: Payer: Self-pay | Admitting: Family Medicine

## 2017-06-22 DIAGNOSIS — H353231 Exudative age-related macular degeneration, bilateral, with active choroidal neovascularization: Secondary | ICD-10-CM | POA: Diagnosis not present

## 2017-06-22 LAB — CUP PACEART REMOTE DEVICE CHECK
Date Time Interrogation Session: 20181026224050
Implantable Pulse Generator Implant Date: 20170504

## 2017-06-27 ENCOUNTER — Other Ambulatory Visit: Payer: Self-pay | Admitting: Internal Medicine

## 2017-07-04 ENCOUNTER — Encounter: Payer: Self-pay | Admitting: Family Medicine

## 2017-07-04 ENCOUNTER — Ambulatory Visit: Payer: PPO | Admitting: Family Medicine

## 2017-07-04 DIAGNOSIS — R300 Dysuria: Secondary | ICD-10-CM | POA: Diagnosis not present

## 2017-07-04 LAB — URINALYSIS, ROUTINE W REFLEX MICROSCOPIC
Ketones, ur: NEGATIVE
Nitrite: NEGATIVE
Specific Gravity, Urine: 1.02 (ref 1.000–1.030)
Total Protein, Urine: 30 — AB
Urine Glucose: NEGATIVE
Urobilinogen, UA: 0.2 (ref 0.0–1.0)
pH: 6 (ref 5.0–8.0)

## 2017-07-04 MED ORDER — CIPROFLOXACIN HCL 500 MG PO TABS
500.0000 mg | ORAL_TABLET | Freq: Two times a day (BID) | ORAL | 0 refills | Status: DC
Start: 2017-07-04 — End: 2017-07-19

## 2017-07-04 NOTE — Patient Instructions (Signed)
Get a urine sample prior to starting cipro.  Drop off the urine sample.  I already gave the lab the orders to process it.  If you are getting worse then let me know.  Take care.  Glad to see you.

## 2017-07-04 NOTE — Progress Notes (Signed)
Frequency of urination.  Urgency.  Started in the last few days, clearly worse from baseline.  He had a fever yesterday, 1004.  No vomiting, no diarrhea. No cough.  Not SOB.  No rhinorrhea.  Burning with urination initially, but not since.  No blood in urine.  Some intermittent occ lower abd pain but not like prev.  Prev abd pain resolved.     Meds, vitals, and allergies reviewed.   ROS: Per HPI unless specifically indicated in ROS section   nad ncat rrr ctab abd soft No cva pain Suprapubic area not ttp  Ext w/o edema

## 2017-07-05 NOTE — Assessment & Plan Note (Signed)
Presumed cystitis.  Check urine culture.  See notes on labs.  Start Cipro.  Routine cautions given.  He agrees.

## 2017-07-06 LAB — URINE CULTURE
MICRO NUMBER:: 81277982
SPECIMEN QUALITY:: ADEQUATE

## 2017-07-17 ENCOUNTER — Ambulatory Visit (INDEPENDENT_AMBULATORY_CARE_PROVIDER_SITE_OTHER): Payer: PPO | Admitting: *Deleted

## 2017-07-17 DIAGNOSIS — I63429 Cerebral infarction due to embolism of unspecified anterior cerebral artery: Secondary | ICD-10-CM

## 2017-07-18 ENCOUNTER — Ambulatory Visit: Payer: PPO | Admitting: Family Medicine

## 2017-07-18 DIAGNOSIS — H353221 Exudative age-related macular degeneration, left eye, with active choroidal neovascularization: Secondary | ICD-10-CM | POA: Diagnosis not present

## 2017-07-18 DIAGNOSIS — H353211 Exudative age-related macular degeneration, right eye, with active choroidal neovascularization: Secondary | ICD-10-CM | POA: Diagnosis not present

## 2017-07-18 NOTE — Progress Notes (Signed)
Carelink Summary Report / Loop Recorder 

## 2017-07-19 ENCOUNTER — Ambulatory Visit: Payer: PPO | Admitting: Family Medicine

## 2017-07-19 ENCOUNTER — Encounter: Payer: Self-pay | Admitting: Family Medicine

## 2017-07-19 VITALS — BP 140/70 | HR 78 | Temp 98.5°F | Wt 150.0 lb

## 2017-07-19 DIAGNOSIS — R300 Dysuria: Secondary | ICD-10-CM | POA: Diagnosis not present

## 2017-07-19 LAB — POCT URINALYSIS DIPSTICK
Bilirubin, UA: NEGATIVE
Glucose, UA: NEGATIVE
Ketones, UA: NEGATIVE
Nitrite, UA: NEGATIVE
Spec Grav, UA: 1.02 (ref 1.010–1.025)
Urobilinogen, UA: 0.2 E.U./dL
pH, UA: 6 (ref 5.0–8.0)

## 2017-07-19 MED ORDER — TRAMADOL HCL 50 MG PO TABS: ORAL_TABLET | ORAL | 2 refills | Status: DC

## 2017-07-19 MED ORDER — CIPROFLOXACIN HCL 500 MG PO TABS: 500.0000 mg | ORAL_TABLET | Freq: Two times a day (BID) | ORAL | 0 refills | Status: DC

## 2017-07-19 NOTE — Progress Notes (Signed)
Burning with urination.  He got some better on the cipro but sx didn't fully resolve, then got worse in the meantime.  Lower abd pain episodically.  No fevers known.  He had some chills.  Urinary frequency.    He needed a refill on tramadol.  He is using them prn w/o ADE.  It helps.  rx couldn't be printed, so written with same sig and given to patient.    Meds, vitals, and allergies reviewed.   ROS: Per HPI unless specifically indicated in ROS section   nad nad Neck supple, no LA rrr ctab abd soft Lower abd not ttp, no rebound (no pain at time of exam) Ext w/o edema.

## 2017-07-19 NOTE — Patient Instructions (Signed)
Drink plenty of water and start the antibiotics today.  We'll contact you with your lab report.  Take care.   Update me if this doesn't totally get better by the end of the medicine or if you get worse in the meantime.

## 2017-07-20 NOTE — Assessment & Plan Note (Signed)
Likely UTI.  Restart cipro.  Check ucx.  U/a d/w pt.  He'll update me if this doesn't totally get better by the end of the tx or if you get worse in the meantime. Okay for outpatient f/u.

## 2017-07-22 LAB — URINE CULTURE
MICRO NUMBER:: 81336827
SPECIMEN QUALITY:: ADEQUATE

## 2017-07-25 ENCOUNTER — Telehealth: Payer: Self-pay | Admitting: Family Medicine

## 2017-07-25 MED ORDER — CIPROFLOXACIN HCL 500 MG PO TABS
500.0000 mg | ORAL_TABLET | Freq: Two times a day (BID) | ORAL | 0 refills | Status: DC
Start: 2017-07-25 — End: 2017-08-03

## 2017-07-25 NOTE — Telephone Encounter (Signed)
Pt has appt on 07/31/17 at 12:15 with Dr Para Marchuncan.

## 2017-07-25 NOTE — Telephone Encounter (Addendum)
Sent.  Thanks.  Would continue since he is getting some better.  If not totally better at the end of this rx (or if worse in the meantime) then update me.

## 2017-07-25 NOTE — Telephone Encounter (Signed)
Wife called to report pt. Feels better from his UTI, but he is still having some burning and abdominal discomfort. Requesting additional Cipro be called in.

## 2017-07-25 NOTE — Telephone Encounter (Signed)
Copied from CRM [phone]. Topic: Quick Communication - See Telephone Encounter >> Jul 25, 2017  8:43 AM Rudi CocoLathan, Jem Castro M, NT wrote: CRM for notification. See Telephone encounter for:   07/25/17. Pt. Wife called and said pt. Is doing a lot better but not 100% still having abdominal pain. Made an appt. For Monday December 10 but only has 1 pill left of (cipro) was seeing if he could get e refill or enough to get through the week.   [facility] [place] - [id number] [place] [id number] Phone: [phone] Fax: [phone] Not a 24 hour pharmacy; exact hours not known

## 2017-07-25 NOTE — Telephone Encounter (Signed)
Patient advised.

## 2017-07-25 NOTE — Addendum Note (Signed)
Addended by: Joaquim NamUNCAN, Azari Janssens S on: 07/25/2017 03:22 PM   Modules accepted: Orders

## 2017-07-31 ENCOUNTER — Ambulatory Visit: Payer: PPO | Admitting: Family Medicine

## 2017-07-31 LAB — CUP PACEART REMOTE DEVICE CHECK
Date Time Interrogation Session: 20181125231018
Implantable Pulse Generator Implant Date: 20170504

## 2017-08-01 ENCOUNTER — Ambulatory Visit: Payer: Self-pay | Admitting: *Deleted

## 2017-08-01 NOTE — Telephone Encounter (Signed)
Pt on anitbiotics since 07/19/17 for UTI; He does feel like his UTI has gotten better; this am pt complains that his testicles are sore; pt does have a history of prostatitis (see note 04/06/18 Dr Para Marchuncan); pt's appointment for 07/31/17 cancelled due to inclement weather;Pt declined appointment per nurse triage; Dr Para Marchuncan has no appointments on 08/02/17; pt declined the opportunity to see another provider in the practice and will only see Dr Para Marchuncan; pt and wife offered and accepted appointment on 08/03/17 at 1230; pt's wife verbalizes understanding.    Reason for Disposition . [1] Pain comes and goes (intermittent) AND [2] present > 24 hours  Answer Assessment - Initial Assessment Questions 1. LOCATION and RADIATION: "Where is the pain located?"      Both; L > R 2. QUALITY: "What does the pain feel like?"  (e.g., sharp, dull, aching, burning)     Dull "enough to drive you up the wall" 3. SEVERITY: "How bad is the pain?"  (Scale 1-10; or mild, moderate, severe)   - MILD (1-3): doesn't interfere with normal activities    - MODERATE (4-7): interferes with normal activities (e.g., work or school) or awakens from sleep   - SEVERE (8-10): excruciating pain, unable to do any normal activities, difficulty walking     mild 4. ONSET: "When did the pain start?"      4 days ago 5. PATTERN: "Does it come and go, or has it been constant since it started?"     Comes and goes 6. SCROTAL APPEARANCE: "What does the scrotum look like?" "Is there any swelling or redness?"      no 7. HERNIA: "Has a doctor ever told you that you have a hernia?"     Hernia surgery years ago 8. OTHER SYMPTOMS: "Do you have any other symptoms?" (e.g., fever, abdominal pain, vomiting, difficulty passing urine)     no  Protocols used: SCROTAL PAIN-A-AH

## 2017-08-02 NOTE — Telephone Encounter (Signed)
Notify patient that if symptoms are dramatically worse in the meantime to update us/seek urgent evaluation.  Thanks.

## 2017-08-02 NOTE — Telephone Encounter (Signed)
I spoke with pts wife [name] (DPR signed) and pt took Tylenol which has helped the testicular pain. Emma notified as instructed by Dr Para Marchuncan and she voiced understanding and will let pt be aware.

## 2017-08-03 ENCOUNTER — Encounter: Payer: Self-pay | Admitting: Family Medicine

## 2017-08-03 ENCOUNTER — Ambulatory Visit: Payer: PPO | Admitting: Family Medicine

## 2017-08-03 DIAGNOSIS — N50819 Testicular pain, unspecified: Secondary | ICD-10-CM | POA: Diagnosis not present

## 2017-08-03 MED ORDER — CIPROFLOXACIN HCL 500 MG PO TABS: 500.0000 mg | ORAL_TABLET | Freq: Two times a day (BID) | ORAL | 0 refills | Status: DC

## 2017-08-03 NOTE — Patient Instructions (Signed)
We'll check with urology in the meantime.  If you have any more pain with urination, then restart the cipro.  Don't take it at this point since you don't have urinary symptoms.  If you have testicle pain, then take tylenol or tramadol.  Take care.  Glad to see you.

## 2017-08-03 NOTE — Progress Notes (Signed)
No burning with urination.  Done with cipro after ucx positive, C&S d/w pt.  No frequency.  LUTS are resolved.  He finished the last abx yesterday after 2 weeks of treatment.    Now with different sx.  He has testicle pain with episodic swelling.  No pain or swelling now.  He can have episodes of pain the episodes of relief.   Pain and swelling may be worse on the L>R when it happens.  He can have pain either side, but not always concurrently.  Minimal abd discomfort.  No vomiting, no diarrhea.   He can have some concurrent lower back pain.    He had seen [facility] earlier this year.    Meds, vitals, and allergies reviewed.   ROS: Per HPI unless specifically indicated in ROS section   GEN: nad, alert and oriented CV: rrr. PULM: ctab, no inc wob ABD: soft, +bs, not ttp EXT: no edema SKIN: no acute rash Testes bilaterally descended without nodularity, tenderness or masses. No scrotal masses or lesions. No penis lesions or urethral discharge.

## 2017-08-06 DIAGNOSIS — N50819 Testicular pain, unspecified: Secondary | ICD-10-CM | POA: Insufficient documentation

## 2017-08-06 NOTE — Assessment & Plan Note (Addendum)
This is clearly different from his previous symptoms.  He had unremarkable testicular ultrasound follow-up previously.  Discussed with patient.  He has testicle pain with episodic swelling.  No pain or swelling now.  He can have episodes of pain the episodes of relief.   Pain and swelling may be worse on the L>R when it happens.  He can have pain either side, but not always concurrently.   I do not have a clear explanation and I want to talk to [facility].  No change in plan at this point.  He agrees. If he has return of pain he can take Tylenol or tramadol.  If he has return of dysuria that I want him to restart Cipro and update me. He agrees.

## 2017-08-16 ENCOUNTER — Ambulatory Visit (INDEPENDENT_AMBULATORY_CARE_PROVIDER_SITE_OTHER): Payer: PPO | Admitting: *Deleted

## 2017-08-16 DIAGNOSIS — I63429 Cerebral infarction due to embolism of unspecified anterior cerebral artery: Secondary | ICD-10-CM | POA: Diagnosis not present

## 2017-08-16 NOTE — Progress Notes (Signed)
Carelink Summary Report / Loop Recorder 

## 2017-08-17 DIAGNOSIS — H353221 Exudative age-related macular degeneration, left eye, with active choroidal neovascularization: Secondary | ICD-10-CM | POA: Diagnosis not present

## 2017-08-26 LAB — CUP PACEART REMOTE DEVICE CHECK
Date Time Interrogation Session: 20181225230824
Implantable Pulse Generator Implant Date: 20170504

## 2017-08-28 ENCOUNTER — Encounter: Payer: Self-pay | Admitting: *Deleted

## 2017-09-05 DIAGNOSIS — H353221 Exudative age-related macular degeneration, left eye, with active choroidal neovascularization: Secondary | ICD-10-CM | POA: Diagnosis not present

## 2017-09-05 DIAGNOSIS — H353211 Exudative age-related macular degeneration, right eye, with active choroidal neovascularization: Secondary | ICD-10-CM | POA: Diagnosis not present

## 2017-09-11 ENCOUNTER — Telehealth: Payer: Self-pay | Admitting: Family Medicine

## 2017-09-11 NOTE — Telephone Encounter (Signed)
Notify pt.  Late entry.   I had tried to call urology mult times, they had been trying to get in touch with me.  We talked last week.  It is likely that he has nonspecific inflammatory changes in the testicles that may not be associated with infection.  He may episodically have symptoms with swelling/discomfort.  If any suggestion of infection, then we should eval/treat for that.   O/w he may have waxing and waning sx.   Thanks.

## 2017-09-11 NOTE — Telephone Encounter (Signed)
Wife advised. 

## 2017-09-12 ENCOUNTER — Other Ambulatory Visit: Payer: Self-pay | Admitting: Internal Medicine

## 2017-09-12 ENCOUNTER — Telehealth: Payer: Self-pay

## 2017-09-12 NOTE — Telephone Encounter (Signed)
Copied from CRM #[id number]. Topic: Inquiry >> Sep 12, 2017  3:08 PM Landry MellowFoltz, Melissa J wrote: Reason for CRM: wife called the form about patient assistance support ( [employer]) for prolia was not received yet. Wife is asking for another form to be completed.  Cb# [phone]

## 2017-09-12 NOTE — Telephone Encounter (Signed)
Per chart, pt does not receive prolia injections and he is not in my prolia binder as a prolia patient.

## 2017-09-12 NOTE — Telephone Encounter (Signed)
This form is not for Prolia, it is for Praluent.  Wife received the form from us in the mail and mailed it to [employer] about 2 weeks ago but they say they don't have it and suggested that another be filled out and submitted.

## 2017-09-12 NOTE — Telephone Encounter (Signed)
Please check with the company and I'll fill it out again if needed.  I specifically recall filling this out- we did this.

## 2017-09-12 NOTE — Telephone Encounter (Signed)
Company has not received the form but says it is not unusual for it to take 3 or 4 weeks to process mailed applications.  In the meanwhile, patient's wife requests that we print another form and she will come by the office on September 13, 2017 and pick up the form, complete the patient's portion and leave it with us for completion and faxing.

## 2017-09-13 NOTE — Telephone Encounter (Signed)
PT dropped off completed forms to be faxed. I placed in Rx tower.

## 2017-09-13 NOTE — Telephone Encounter (Signed)
I'll work on it after she/pt does the patient's portion.  Thanks.

## 2017-09-13 NOTE — Telephone Encounter (Signed)
Forms are on your desk for completion.

## 2017-09-14 ENCOUNTER — Ambulatory Visit (INDEPENDENT_AMBULATORY_CARE_PROVIDER_SITE_OTHER): Payer: PPO | Admitting: *Deleted

## 2017-09-14 DIAGNOSIS — I63429 Cerebral infarction due to embolism of unspecified anterior cerebral artery: Secondary | ICD-10-CM

## 2017-09-15 NOTE — Progress Notes (Signed)
Carelink Summary Report / Loop Recorder 

## 2017-09-19 DIAGNOSIS — H353211 Exudative age-related macular degeneration, right eye, with active choroidal neovascularization: Secondary | ICD-10-CM | POA: Diagnosis not present

## 2017-09-19 DIAGNOSIS — H52203 Unspecified astigmatism, bilateral: Secondary | ICD-10-CM | POA: Diagnosis not present

## 2017-09-19 DIAGNOSIS — Z961 Presence of intraocular lens: Secondary | ICD-10-CM | POA: Diagnosis not present

## 2017-09-19 DIAGNOSIS — H353221 Exudative age-related macular degeneration, left eye, with active choroidal neovascularization: Secondary | ICD-10-CM | POA: Diagnosis not present

## 2017-09-22 LAB — CUP PACEART REMOTE DEVICE CHECK
Date Time Interrogation Session: 20190124233931
Implantable Pulse Generator Implant Date: 20170504

## 2017-09-24 ENCOUNTER — Other Ambulatory Visit: Payer: Self-pay | Admitting: Family Medicine

## 2017-09-25 NOTE — Telephone Encounter (Signed)
Electronic refill request.  Last office visit:   08/03/17 Last Filled:   Tramadol 60 tablet 2 07/19/2017  Last Filled:   Cipro 14 tablet 0 08/03/2017  Please advise.

## 2017-09-26 NOTE — Telephone Encounter (Signed)
Wife states he does not need the Cipro.  Wife says things are all confused up at [facility] and they did not request Cipro.  Rx denied.

## 2017-09-26 NOTE — Telephone Encounter (Signed)
Noted. Thanks.

## 2017-09-26 NOTE — Telephone Encounter (Signed)
Tramadol sent.  Please get update from patient prior to refill on cipro.  Thanks.

## 2017-10-03 ENCOUNTER — Telehealth: Payer: Self-pay | Admitting: *Deleted

## 2017-10-04 NOTE — Telephone Encounter (Signed)
Dr. Johney FrameAllred reviewed ECGs--likely SR w/PACs and PVCs.  Plan to continue monitoring remotely via Carelink.  No changes at this time.

## 2017-10-04 NOTE — Telephone Encounter (Signed)
Spoke with patient's wife (DPR) to request a manual Carelink transmission for review of any "AF" episodes that have not transmitted automatically.  Transmission successful, ECGs suggest SR w/PACs and PVCs, not AF.  Advised patient's wife that I will review with Dr. Johney FrameAllred and call her back if any additional recommendations.  She is appreciative and denies additional questions or concerns at this time.

## 2017-10-09 ENCOUNTER — Other Ambulatory Visit: Payer: Self-pay | Admitting: Family Medicine

## 2017-10-09 DIAGNOSIS — E538 Deficiency of other specified B group vitamins: Secondary | ICD-10-CM

## 2017-10-09 DIAGNOSIS — E611 Iron deficiency: Secondary | ICD-10-CM

## 2017-10-09 DIAGNOSIS — E876 Hypokalemia: Secondary | ICD-10-CM

## 2017-10-09 DIAGNOSIS — I10 Essential (primary) hypertension: Secondary | ICD-10-CM

## 2017-10-10 ENCOUNTER — Ambulatory Visit (INDEPENDENT_AMBULATORY_CARE_PROVIDER_SITE_OTHER): Payer: PPO

## 2017-10-10 VITALS — BP 110/82 | HR 67 | Temp 97.7°F | Ht 66.5 in | Wt 155.8 lb

## 2017-10-10 DIAGNOSIS — Z0000 Encounter for general adult medical examination without abnormal findings: Secondary | ICD-10-CM | POA: Diagnosis not present

## 2017-10-10 DIAGNOSIS — I10 Essential (primary) hypertension: Secondary | ICD-10-CM

## 2017-10-10 DIAGNOSIS — E538 Deficiency of other specified B group vitamins: Secondary | ICD-10-CM | POA: Diagnosis not present

## 2017-10-10 DIAGNOSIS — E611 Iron deficiency: Secondary | ICD-10-CM | POA: Diagnosis not present

## 2017-10-10 LAB — LIPID PANEL
Cholesterol: 136 mg/dL (ref 0–200)
HDL: 48.5 mg/dL (ref >39.00)
LDL Cholesterol: 65 mg/dL (ref 0–99)
NonHDL: 87.79
Total CHOL/HDL Ratio: 3
Triglycerides: 115 mg/dL (ref 0.0–149.0)
VLDL: 23 mg/dL (ref 0.0–40.0)

## 2017-10-10 LAB — COMPREHENSIVE METABOLIC PANEL
ALT: 9 U/L (ref 0–53)
AST: 17 U/L (ref 0–37)
Albumin: 4.1 g/dL (ref 3.5–5.2)
Alkaline Phosphatase: 94 U/L (ref 39–117)
BUN: 17 mg/dL (ref 6–23)
CO2: 31 mEq/L (ref 19–32)
Calcium: 9.8 mg/dL (ref 8.4–10.5)
Chloride: 102 mEq/L (ref 96–112)
Creatinine, Ser: 1.11 mg/dL (ref 0.40–1.50)
GFR: 66.15 mL/min (ref >60.00)
Glucose, Bld: 90 mg/dL (ref 70–99)
Potassium: 4.1 mEq/L (ref 3.5–5.1)
Sodium: 139 mEq/L (ref 135–145)
Total Bilirubin: 0.4 mg/dL (ref 0.2–1.2)
Total Protein: 7.5 g/dL (ref 6.0–8.3)

## 2017-10-10 LAB — CBC WITH DIFFERENTIAL/PLATELET
Basophils Absolute: 0 10*3/uL (ref 0.0–0.1)
Basophils Relative: 0.8 % (ref 0.0–3.0)
Eosinophils Absolute: 0.4 10*3/uL (ref 0.0–0.7)
Eosinophils Relative: 6.3 % — ABNORMAL HIGH (ref 0.0–5.0)
HCT: 44 % (ref 39.0–52.0)
Hemoglobin: 14.6 g/dL (ref 13.0–17.0)
Lymphocytes Relative: 31.3 % (ref 12.0–46.0)
Lymphs Abs: 1.8 10*3/uL (ref 0.7–4.0)
MCHC: 33.2 g/dL (ref 30.0–36.0)
MCV: 88.8 fl (ref 78.0–100.0)
Monocytes Absolute: 0.6 10*3/uL (ref 0.1–1.0)
Monocytes Relative: 10.6 % (ref 3.0–12.0)
Neutro Abs: 2.9 10*3/uL (ref 1.4–7.7)
Neutrophils Relative %: 51 % (ref 43.0–77.0)
Platelets: 289 10*3/uL (ref 150.0–400.0)
RBC: 4.96 Mil/uL (ref 4.22–5.81)
RDW: 13.9 % (ref 11.5–15.5)
WBC: 5.7 10*3/uL (ref 4.0–10.5)

## 2017-10-10 LAB — IBC PANEL
Iron: 91 ug/dL (ref 42–165)
Saturation Ratios: 17.1 % — ABNORMAL LOW (ref 20.0–50.0)
Transferrin: 380 mg/dL — ABNORMAL HIGH (ref 212.0–360.0)

## 2017-10-10 LAB — VITAMIN B12: Vitamin B-12: 579 pg/mL (ref 211–911)

## 2017-10-10 NOTE — Patient Instructions (Signed)
[name] , Thank you for taking time to come for your Medicare Wellness Visit. I appreciate your ongoing commitment to your health goals. Please review the following plan we discussed and let me know if I can assist you in the future.   These are the goals we discussed: Goals    . Follow up with Primary Care Provider     Starting 10/10/2017, I will continue to take medications as prescribed and to keep appointments with PCP as scheduled.        This is a list of the screening recommended for you and due dates:  Health Maintenance  Topic Date Due  . Tetanus Vaccine  07/29/2020  . Flu Shot  Completed  . Pneumonia vaccines  Completed   Preventive Care for Adults  A healthy lifestyle and preventive care can promote health and wellness. Preventive health guidelines for adults include the following key practices.  . A routine yearly physical is a good way to check with your health care provider about your health and preventive screening. It is a chance to share any concerns and updates on your health and to receive a thorough exam.  . Visit your dentist for a routine exam and preventive care every 6 months. Brush your teeth twice a day and floss once a day. Good oral hygiene prevents tooth decay and gum disease.  . The frequency of eye exams is based on your age, health, family medical history, use  of contact lenses, and other factors. Follow your health care provider's recommendations for frequency of eye exams.  . Eat a healthy diet. Foods like vegetables, fruits, whole grains, low-fat dairy products, and lean protein foods contain the nutrients you need without too many calories. Decrease your intake of foods high in solid fats, added sugars, and salt. Eat the right amount of calories for you. Get information about a proper diet from your health care provider, if necessary.  . Regular physical exercise is one of the most important things you can do for your health. Most adults should  get at least 150 minutes of moderate-intensity exercise (any activity that increases your heart rate and causes you to sweat) each week. In addition, most adults need muscle-strengthening exercises on 2 or more days a week.  Silver Sneakers may be a benefit available to you. To determine eligibility, you may visit the website: www.silversneakers.com or contact program at [phone] Mon-Fri between 8AM-8PM.   . Maintain a healthy weight. The body mass index (BMI) is a screening tool to identify possible weight problems. It provides an estimate of body fat based on height and weight. Your health care provider can find your BMI and can help you achieve or maintain a healthy weight.   For adults 20 years and older: ? A BMI below 18.5 is considered underweight. ? A BMI of 18.5 to 24.9 is normal. ? A BMI of 25 to 29.9 is considered overweight. ? A BMI of 30 and above is considered obese.   . Maintain normal blood lipids and cholesterol levels by exercising and minimizing your intake of saturated fat. Eat a balanced diet with plenty of fruit and vegetables. Blood tests for lipids and cholesterol should begin at age 82 and be repeated every 5 years. If your lipid or cholesterol levels are high, you are over 50, or you are at high risk for heart disease, you may need your cholesterol levels checked more frequently. Ongoing high lipid and cholesterol levels should be treated with medicines if  diet and exercise are not working.  . If you smoke, find out from your health care provider how to quit. If you do not use tobacco, please do not start.  . If you choose to drink alcohol, please do not consume more than 2 drinks per day. One drink is considered to be 12 ounces (355 mL) of beer, 5 ounces (148 mL) of wine, or 1.5 ounces (44 mL) of liquor.  . If you are 17-63 years old, ask your health care provider if you should take aspirin to prevent strokes.  . Use sunscreen. Apply sunscreen liberally and  repeatedly throughout the day. You should seek shade when your shadow is shorter than you. Protect yourself by wearing long sleeves, pants, a wide-brimmed hat, and sunglasses year round, whenever you are outdoors.  . Once a month, do a whole body skin exam, using a mirror to look at the skin on your back. Tell your health care provider of new moles, moles that have irregular borders, moles that are larger than a pencil eraser, or moles that have changed in shape or color.

## 2017-10-10 NOTE — Progress Notes (Signed)
PCP notes:   Health maintenance:  No gaps identified.   Abnormal screenings:   Hearing - failed (right ear)  Hearing Screening   125Hz  250Hz  500Hz  1000Hz  2000Hz  3000Hz  4000Hz  6000Hz  8000Hz   Right ear:   0 0 0  0    Left ear:           Comments: Hearing aid - left ear  Mini-Cog score: 19/20 MMSE - Mini Mental State Exam 10/10/2017  Orientation to time 5  Orientation to Place 5  Registration 3  Attention/ Calculation 0  Recall 3  Language- name 2 objects 0  Language- repeat 1  Language- follow 3 step command 2  Language- follow 3 step command-comments unable to complete 1 step of 3 step command  Language- read & follow direction 0  Write a sentence 0  Copy design 0  Total score 19     Patient concerns:   None  Nurse concerns:  None  Next PCP appt:   10/17/17 @ 1045  I reviewed health advisor's note, was available for consultation on the day of service listed in this note, and agree with documentation and plan. Crawford GivensGraham Duncan, MD.

## 2017-10-10 NOTE — Progress Notes (Signed)
Subjective:   [name] is a 84 y.o. male who presents for Medicare Annual/Subsequent preventive examination.  Review of Systems:  N/A Cardiac Risk Factors include: advanced age (>45men, >46 women);male gender;dyslipidemia;hypertension     Objective:    Vitals: BP 110/82 (BP Location: Left Arm, Patient Position: Sitting, Cuff Size: Normal)   Pulse 67   Temp 97.7 F (36.5 C) (Oral)   Ht 5' 6.5" (1.689 m) Comment: no shoes  Wt 155 lb 12 oz (70.6 kg)   SpO2 95%   BMI 24.76 kg/m   Body mass index is 24.76 kg/m.  Advanced Directives 10/10/2017 11/29/2016 05/23/2016 02/22/2016 11/16/2015 10/27/2015 08/14/2015  Does Patient Have a Medical Advance Directive? Yes No Yes No No No No  Type of Estate agent of [place];Living will - Living will - - - -  Does patient want to make changes to medical advance directive? - - No - Patient declined - - - -  Copy of Healthcare Power of Attorney in Chart? No - copy requested - No - copy requested - - - -  Would patient like information on creating a medical advance directive? - - - - No - patient declined information - No - patient declined information    Tobacco Social History   Tobacco Use  Smoking Status Former Smoker  . Packs/day: 0.00  . Years: 36.00  . Pack years: 0.00  . Types: Cigarettes  Smokeless Tobacco Never Used  Tobacco Comment   Quit in 1983     Counseling given: No Comment: Quit in 1983   Clinical Intake:  Pre-visit preparation completed: Yes  Pain : No/denies pain Pain Score: 0-No pain     Nutritional Status: BMI 25 -29 Overweight Nutritional Risks: None Diabetes: No  How often do you need to have someone help you when you read instructions, pamphlets, or other written materials from your doctor or pharmacy?: 2 - Rarely What is the last grade level you completed in school?: 3rd grade  Interpreter Needed?: No  Comments: pt lives with spouse Information entered by :: LPinson, LPN  Past  Medical History:  Diagnosis Date  . Back pain    improved with gabapentin as of 2015  . Barrett esophagus 09/2003  . Diverticulitis   . Diverticulosis   . GERD (gastroesophageal reflux disease)   . GI AVM (gastrointestinal arteriovenous vascular malformation)   . Hearing loss   . Hemorrhoids   . Hemorrhoids   . History of Holter monitoring    a. holter 9/17: NSR, PVCs; no sustained arrhythmia; PVC burden 8%  . History of nuclear stress test    a. Myoview 8/17: Intermediate risk, inferior ischemia, not gated  . History of prostatitis   . Hyperlipidemia   . Hypertension   . Macular degeneration, wet (HCC)    receiving intra-ocular injections ([facility])  . PUD (peptic ulcer disease)   . Stroke [facility])    Past Surgical History:  Procedure Laterality Date  . EP IMPLANTABLE DEVICE N/A 12/24/2015   Procedure: Loop Recorder Insertion;  Surgeon: Hillis Range, MD;  Location: [facility] INVASIVE CV LAB;  Service: Cardiovascular;  Laterality: N/A;  . INGUINAL HERNIA REPAIR  2001  . LAPAROSCOPIC APPENDECTOMY N/A 07/07/2014   Procedure: APPENDECTOMY LAPAROSCOPIC;  Surgeon: Manus Rudd, MD;  Location: [facility] OR;  Service: General;  Laterality: N/A;  . TEE WITHOUT CARDIOVERSION N/A 10/28/2015   Procedure: TRANSESOPHAGEAL ECHOCARDIOGRAM (TEE);  Surgeon: Laurey Morale, MD;  Location: [facility] ENDOSCOPY;  Service: Cardiovascular;  Laterality:  N/A;   Family History  Problem Relation Age of Onset  . Heart attack Father   . Coronary artery disease Father   . Hypertension Mother   . Pancreatic cancer Mother        Died at 33.  . Cancer Mother        Pancreatic cancer  . Arthritis Sister   . Hyperlipidemia Sister   . Prostate cancer Brother   . Cancer Brother        Bone Cancer secondary to Prostate Cancer  . Bleeding Disorder Son   . Diabetes Neg Hx    Social History   Socioeconomic History  . Marital status: Married    Spouse name: Cher Egnor  . Number of children: None  . Years of education: None  .  Highest education level: None  Social Needs  . Financial resource strain: None  . Food insecurity - worry: None  . Food insecurity - inability: None  . Transportation needs - medical: None  . Transportation needs - non-medical: None  Occupational History  . Occupation: Retired    Associate Professor: RETIRED  Tobacco Use  . Smoking status: Former Smoker    Packs/day: 0.00    Years: 36.00    Pack years: 0.00    Types: Cigarettes  . Smokeless tobacco: Never Used  . Tobacco comment: Quit in 1982-01-06  Substance and Sexual Activity  . Alcohol use: No    Alcohol/week: 0.0 oz  . Drug use: No  . Sexual activity: None  Other Topics Concern  . None  Social History Narrative   6th grade. Married 01/07/1951 1 son, 1 dtr - died at 5 months. Work - Management consultant, Engineer, drilling, Catering manager.   Retired-fulltime. "[employer] ACP - yes DNR, yes short-term mechanical ventilarion, no heroic or futile measures in face of irreversible.    Outpatient Encounter Medications as of 10/10/2017  Medication Sig  . Bevacizumab (AVASTIN IV) Place 1 Applicatorful into both eyes See admin instructions. Right eye every 7 weeks Left eye every 7 weeks  . cetirizine (ZYRTEC) 10 MG tablet Take 10 mg by mouth daily as needed for allergies.   [name] Kitchen clopidogrel (PLAVIX) 75 MG tablet Take 1 tablet (75 mg total) by mouth daily.  [name] Kitchen diltiazem (CARDIZEM CD) 180 MG 24 hr capsule TAKE ONE (1) CAPSULE BY MOUTH EACH DAY  . ferrous sulfate 325 (65 FE) MG tablet Take 1 tablet (325 mg total) by mouth daily with breakfast.  . fluticasone (FLONASE) 50 MCG/ACT nasal spray Place 2 sprays into both nostrils daily as needed for allergies.  [name] Kitchen gabapentin (NEURONTIN) 300 MG capsule TAKE ONE (1) CAPSULE BY MOUTH 2 TIMES DAILY  . gemfibrozil (LOPID) 600 MG tablet Take 1 tablet (600 mg total) by mouth 2 (two) times daily.  [name] Kitchen guaiFENesin (ROBITUSSIN) 100 MG/5ML SOLN Take 5 mLs (100 mg total) by mouth every 6 (six) hours as needed for cough.  . hydrochlorothiazide  (HYDRODIURIL) 25 MG tablet Take 1 tablet (25 mg total) by mouth daily.  . Hypromellose (ARTIFICIAL TEARS OP) Apply 1 drop to eye daily as needed (itchy / watery eyes).  [name] Kitchen loperamide (IMODIUM) 2 MG capsule Take 1 capsule (2 mg total) by mouth 3 (three) times daily as needed for diarrhea or loose stools.  . meclizine (ANTIVERT) 25 MG tablet Take 0.5 tablets (12.5 mg total) by mouth 3 (three) times daily as needed for dizziness.  . Multiple Vitamins-Minerals (OCUVITE ADULT 50+ PO) Take 1 capsule by mouth daily.    [name] Kitchen  nitroGLYCERIN (NITROSTAT) 0.4 MG SL tablet Place 1 tablet (0.4 mg total) under the tongue every 5 (five) minutes as needed for chest pain (max 3 doses in 15 minutes).  . pantoprazole (PROTONIX) 40 MG tablet Take 1 tablet (40 mg total) by mouth every morning.  [name] Kitchen PRALUENT 150 MG/ML SOPN INJECT 1 SYRINGE INTO THE SKIN ONCE EVERY 14 DAYS  . ranitidine (ZANTAC) 150 MG tablet TAKE 1 TABLET BY MOUTH TWICE A DAY  . rOPINIRole (REQUIP) 1 MG tablet Take 1 tablet (1 mg total) by mouth at bedtime.  . sucralfate (CARAFATE) 1 g tablet TAKE 1 TABLET BY MOUTH BEFORE LAYING DOWN FOR NAP ORAS NEEDED  . tamsulosin (FLOMAX) 0.4 MG CAPS capsule Take 0.4 mg by mouth daily.   . traMADol (ULTRAM) 50 MG tablet TAKE ONE TABLET BY MOUTH EVERY EIGHT HOURS AS NEEDED FOR MODERATE TO SEVERE PAIN  . vitamin B-12 (CYANOCOBALAMIN) 1000 MCG tablet Take 2 tablets (2,000 mcg total) by mouth daily.  . potassium chloride (K-DUR) 10 MEQ tablet Take 1 tablet (10 mEq total) by mouth daily.  . [DISCONTINUED] ciprofloxacin (CIPRO) 500 MG tablet Take 1 tablet (500 mg total) by mouth 2 (two) times daily.  . [DISCONTINUED] lisinopril (PRINIVIL,ZESTRIL) 10 MG tablet Take 1 tablet (10 mg total) by mouth daily.   No facility-administered encounter medications on file as of 10/10/2017.     Activities of Daily Living In your present state of health, do you have any difficulty performing the following activities: 10/10/2017  Hearing? Y    Vision? N  Difficulty concentrating or making decisions? Y  Walking or climbing stairs? Y  Dressing or bathing? N  Doing errands, shopping? N  Preparing Food and eating ? N  Using the Toilet? N  In the past six months, have you accidently leaked urine? N  Do you have problems with loss of bowel control? N  Managing your Medications? N  Managing your Finances? N  Housekeeping or managing your Housekeeping? N  Some recent data might be hidden    Patient Care Team: Joaquim Nam, MD as PCP - General (Family Medicine)   Assessment:   This is a routine wellness examination for Martie.  Exercise Activities and Dietary recommendations Current Exercise Habits: The patient does not participate in regular exercise at present, Exercise limited by: None identified  Goals    . Follow up with Primary Care Provider     Starting 10/10/2017, I will continue to take medications as prescribed and to keep appointments with PCP as scheduled.        Fall Risk Fall Risk  10/10/2017 11/07/2016 09/01/2016 05/23/2016 05/10/2016  Falls in the past year? No No Yes No Yes  Number falls in past yr: - - 1 - -  Injury with Fall? - - Yes - -   Depression Screen PHQ 2/9 Scores 10/10/2017 02/01/2017 09/01/2016 05/23/2016  PHQ - 2 Score 0 1 0 0  PHQ- 9 Score 0 - - -    Cognitive Function MMSE - Mini Mental State Exam 10/10/2017  Orientation to time 5  Orientation to Place 5  Registration 3  Attention/ Calculation 0  Recall 3  Language- name 2 objects 0  Language- repeat 1  Language- follow 3 step command 2  Language- follow 3 step command-comments unable to complete 1 step of 3 step command  Language- read & follow direction 0  Write a sentence 0  Copy design 0  Total score 19  PLEASE NOTE: A Mini-Cog screen was completed. Maximum score is 20. A value of 0 denotes this part of Folstein MMSE was not completed or the patient failed this part of the Mini-Cog screening.   Mini-Cog  Screening Orientation to Time - Max 5 pts Orientation to Place - Max 5 pts Registration - Max 3 pts Recall - Max 3 pts Language Repeat - Max 1 pts Language Follow 3 Step Command - Max 3 pts   Immunization History  Administered Date(s) Administered  . Influenza Split 06/07/2011, 06/07/2012  . Influenza Whole 07/31/2007, 06/04/2008, 06/03/2009, 05/20/2010  . Influenza,inj,Quad PF,6+ Mos 06/26/2013, 06/11/2014, 05/26/2015, 04/26/2016, 05/22/2017  . Pneumococcal Conjugate-13 08/12/2013  . Pneumococcal Polysaccharide-23 07/29/2010  . Td 07/29/2010   Screening Tests Health Maintenance  Topic Date Due  . TETANUS/TDAP  07/29/2020  . INFLUENZA VACCINE  Completed  . PNA vac Low Risk Adult  Completed    Plan:     I have personally reviewed, addressed, and noted the following in the patient's chart:  A. Medical and social history B. Use of alcohol, tobacco or illicit drugs  C. Current medications and supplements D. Functional ability and status E.  Nutritional status F.  Physical activity G. Advance directives H. List of other physicians I.  Hospitalizations, surgeries, and ER visits in previous 12 months J.  Vitals K. Screenings to include hearing, vision, cognitive, depression L. Referrals and appointments - none  In addition, I have reviewed and discussed with patient certain preventive protocols, quality metrics, and best practice recommendations. A written personalized care plan for preventive services as well as general preventive health recommendations were provided to patient.  See attached scanned questionnaire for additional information.   Signed,   Randa EvensLesia Charliee Krenz, MHA, BS, LPN Health Coach

## 2017-10-12 DIAGNOSIS — H43813 Vitreous degeneration, bilateral: Secondary | ICD-10-CM | POA: Diagnosis not present

## 2017-10-12 DIAGNOSIS — H353221 Exudative age-related macular degeneration, left eye, with active choroidal neovascularization: Secondary | ICD-10-CM | POA: Diagnosis not present

## 2017-10-12 DIAGNOSIS — H353211 Exudative age-related macular degeneration, right eye, with active choroidal neovascularization: Secondary | ICD-10-CM | POA: Diagnosis not present

## 2017-10-17 ENCOUNTER — Ambulatory Visit (INDEPENDENT_AMBULATORY_CARE_PROVIDER_SITE_OTHER): Payer: PPO | Admitting: *Deleted

## 2017-10-17 ENCOUNTER — Encounter: Payer: PPO | Admitting: Family Medicine

## 2017-10-17 DIAGNOSIS — I63429 Cerebral infarction due to embolism of unspecified anterior cerebral artery: Secondary | ICD-10-CM | POA: Diagnosis not present

## 2017-10-17 NOTE — Progress Notes (Signed)
Carelink Summary Report / Loop Recorder 

## 2017-10-19 ENCOUNTER — Telehealth: Payer: Self-pay | Admitting: *Deleted

## 2017-10-19 NOTE — Telephone Encounter (Signed)
Spoke with patient's wife, [name] [facility]) to request a manual transmission for review of any "AF" episodes that haven't transmitted automatically.  Transmission successfully received.  Advised patient's wife that I will review ECGs when transmission received and call her back if anything abnormal noted.  Patient's wife is appreciative and denies additional questions or concerns at this time.  ECGs show SR w/PVCs and PACs, not AF.  All new ECGs printed and placed in Dr. Alexander BergeronAllred's LINQ folder for review.

## 2017-10-20 ENCOUNTER — Other Ambulatory Visit: Payer: Self-pay | Admitting: Internal Medicine

## 2017-10-24 ENCOUNTER — Telehealth: Payer: Self-pay | Admitting: *Deleted

## 2017-10-24 NOTE — Telephone Encounter (Signed)
PA submitted thru CMM for Praluent.  Patient is receiving this medication thru Patient Assistance with the Manufacturer but PA was submitted for possibly any copays.  Awaiting response.

## 2017-10-25 DIAGNOSIS — H353211 Exudative age-related macular degeneration, right eye, with active choroidal neovascularization: Secondary | ICD-10-CM | POA: Diagnosis not present

## 2017-10-25 DIAGNOSIS — H353221 Exudative age-related macular degeneration, left eye, with active choroidal neovascularization: Secondary | ICD-10-CM | POA: Diagnosis not present

## 2017-10-27 ENCOUNTER — Encounter: Payer: Self-pay | Admitting: Family Medicine

## 2017-10-27 ENCOUNTER — Ambulatory Visit (INDEPENDENT_AMBULATORY_CARE_PROVIDER_SITE_OTHER): Payer: PPO | Admitting: Family Medicine

## 2017-10-27 VITALS — BP 110/82 | HR 67 | Temp 97.7°F | Ht 66.5 in | Wt 155.8 lb

## 2017-10-27 DIAGNOSIS — E611 Iron deficiency: Secondary | ICD-10-CM

## 2017-10-27 DIAGNOSIS — I10 Essential (primary) hypertension: Secondary | ICD-10-CM | POA: Diagnosis not present

## 2017-10-27 DIAGNOSIS — Z7189 Other specified counseling: Secondary | ICD-10-CM

## 2017-10-27 DIAGNOSIS — E782 Mixed hyperlipidemia: Secondary | ICD-10-CM

## 2017-10-27 DIAGNOSIS — H9190 Unspecified hearing loss, unspecified ear: Secondary | ICD-10-CM | POA: Diagnosis not present

## 2017-10-27 DIAGNOSIS — K2270 Barrett's esophagus without dysplasia: Secondary | ICD-10-CM | POA: Diagnosis not present

## 2017-10-27 DIAGNOSIS — G2581 Restless legs syndrome: Secondary | ICD-10-CM

## 2017-10-27 DIAGNOSIS — E538 Deficiency of other specified B group vitamins: Secondary | ICD-10-CM

## 2017-10-27 MED ORDER — HYDROCHLOROTHIAZIDE 25 MG PO TABS: 25.0000 mg | ORAL_TABLET | Freq: Every day | ORAL | 3 refills | Status: DC

## 2017-10-27 MED ORDER — PANTOPRAZOLE SODIUM 40 MG PO TBEC: 40.0000 mg | DELAYED_RELEASE_TABLET | Freq: Every morning | ORAL | 3 refills | Status: DC

## 2017-10-27 MED ORDER — DILTIAZEM HCL ER COATED BEADS 180 MG PO CP24: ORAL_CAPSULE | ORAL | 3 refills | Status: DC

## 2017-10-27 MED ORDER — GEMFIBROZIL 600 MG PO TABS: 600.0000 mg | ORAL_TABLET | Freq: Two times a day (BID) | ORAL | 3 refills | Status: DC

## 2017-10-27 MED ORDER — CLOPIDOGREL BISULFATE 75 MG PO TABS: 75.0000 mg | ORAL_TABLET | Freq: Every day | ORAL | 3 refills | Status: DC

## 2017-10-27 MED ORDER — GABAPENTIN 300 MG PO CAPS: ORAL_CAPSULE | ORAL | 3 refills | Status: DC

## 2017-10-27 MED ORDER — ROPINIROLE HCL 1 MG PO TABS: 1.0000 mg | ORAL_TABLET | Freq: Every day | ORAL | 3 refills | Status: DC

## 2017-10-27 NOTE — Patient Instructions (Signed)
Don't change your meds for now.  Recheck in about 6 months, labs ahead of time.  Take care.  Glad to see you.  Update me as needed.   

## 2017-10-27 NOTE — Progress Notes (Signed)
Hard of hearing at baseline.  Hearing screen failed as expected.  He has trouble with background noise. He has accommodations for church.   He is getting by as is.    Elevated Cholesterol: Using medications without problems:yes Muscle aches: no Diet compliance: yes Exercise: as tolerated, some walking, more when the weather is clear.   Statin intolerant.   Hypertension:    Using medication without problems or lightheadedness: yes Chest pain with exertion:no Edema:no Short of breath:no  H/o Barrett's esophagus.  Still on PPI.  No ADE on med.  Swallowing well.    Still on iron and requip.  Sig dec in RLS sx.  Occ black stools likely from iron.  No blood in stool.    B12 level wnl.   D/w pt.    Advance directive- wife designated if patient were incapacitated   PMH and SH reviewed  Meds, vitals, and allergies reviewed.   ROS: Per HPI unless specifically indicated in ROS section   GEN: nad, alert and oriented HEENT: mucous membranes moist NECK: supple w/o LA CV: rrr. PULM: ctab, no inc wob ABD: soft, +bs EXT: no edema SKIN: no acute rash

## 2017-10-29 NOTE — Assessment & Plan Note (Signed)
Hard of hearing at baseline.  Hearing screen failed as expected.  He has trouble with background noise. He has accommodations for church.   He is getting by as is.

## 2017-10-29 NOTE — Assessment & Plan Note (Signed)
Reasonable control.  Continue as is.  No change in meds.  Labs discussed with patient.  He agrees.

## 2017-10-29 NOTE — Assessment & Plan Note (Signed)
Statin intolerant.  Able to tolerate Praluent, continue as is.  Labs discussed with patient.  Update me as needed.  Continue work on diet and exercise as tolerated.

## 2017-10-29 NOTE — Assessment & Plan Note (Signed)
Symptoms much improved with iron replacement and Requip.  Continue as is.  He agrees.

## 2017-10-29 NOTE — Assessment & Plan Note (Signed)
History of, on iron replacement.  Given his situation is likely better to defer colonoscopy, meaning it is likely safer to defer colonoscopy at this point.  Discussed with patient.  He agrees.  Would continue iron replacement and recheck periodically.  If he has gross blood in his stool or symptomatic changes otherwise then we could talk about more aggressive treatment but given his age and other comorbidities he wanted to avoid colonoscopy if at all possible.  This is reasonable.  Labs discussed with patient. >25 minutes spent in face to face time with patient, >50% spent in counselling or coordination of care.

## 2017-10-29 NOTE — Assessment & Plan Note (Signed)
Level normal.  Discussed with patient.  Continue as is.

## 2017-10-29 NOTE — Assessment & Plan Note (Signed)
Advance directive- wife designated if patient were incapacitated.  

## 2017-10-29 NOTE — Assessment & Plan Note (Signed)
History of Barrett's.  Rationale for continued PPI use discussed with patient.  No adverse effect of medication.  Swallowing well.  Continue as is.

## 2017-10-31 ENCOUNTER — Encounter: Payer: Self-pay | Admitting: Physician Assistant

## 2017-10-31 ENCOUNTER — Ambulatory Visit: Payer: PPO | Admitting: Physician Assistant

## 2017-10-31 VITALS — BP 140/60 | HR 76 | Ht 66.5 in | Wt 163.8 lb

## 2017-10-31 DIAGNOSIS — Z8673 Personal history of transient ischemic attack (TIA), and cerebral infarction without residual deficits: Secondary | ICD-10-CM

## 2017-10-31 DIAGNOSIS — I10 Essential (primary) hypertension: Secondary | ICD-10-CM | POA: Diagnosis not present

## 2017-10-31 DIAGNOSIS — E782 Mixed hyperlipidemia: Secondary | ICD-10-CM

## 2017-10-31 DIAGNOSIS — I2510 Atherosclerotic heart disease of native coronary artery without angina pectoris: Secondary | ICD-10-CM | POA: Diagnosis not present

## 2017-10-31 NOTE — Patient Instructions (Addendum)
Medication Instructions:  No changes  Labwork: None   Testing/Procedures: None   Follow-Up: Your physician wants you to follow-up in: 1 YEAR WITH [name], [facility] You will receive a reminder letter in the mail two months in advance. If you don't receive a letter, please call our office to schedule the follow-up appointment.   Any Other Special Instructions Will Be Listed Below (If Applicable).  If you need a refill on your cardiac medications before your next appointment, please call your pharmacy.

## 2017-10-31 NOTE — Progress Notes (Signed)
Cardiology Office Note:    Date:  10/31/2017   ID:  [name], DOB 1926-01-24, MRN [id number]  PCP:  Joaquim Nam, MD  Cardiologist:  Lance Muss, MD  Electrophysiologist:  Dr. Hillis Range      Referring MD: Joaquim Nam, MD   Chief Complaint  Patient presents with  . Follow-up    CAD    History of Present Illness:    [name] is a 84 y.o. male with Cryptogenic stroke in 6/16 and splenic infarct status post ILR, HTN, HL, COPD, von Willebrand's disease. Patient was seen by Dr. Johney Frame 5/17 and underwent implantation of Linq ILR.  ILR to date has not demonstrated atrial fibrillation.  He has known atherosclerosis on a prior chest CT.  In 2017, he c/o one episode chest pain and a Nuclear stress test demonstrated inferior ischemia.  Med Rx was recommended unless he had recurrent symptoms.   [name] returns for follow-up.  He is here with his wife.  Since last seen, he has been doing well.  He denies any chest discomfort, significant shortness of breath, edema, syncope.  He has acid reflux symptoms if he eats the wrong foods.  Prior CV studies:   The following studies were reviewed today:  Chest CT 04/06/16 IMPRESSION: 1. The nodule questioned on left shoulder films represents a small calcified granuloma within the superior segment of the left lower lobe. There also are small calcified splenic granulomas consistent with prior granulomatous disease. 2. Moderate thoracic aortic atherosclerosis. 3. Calcification of the coronary arteries diffusely. 4. Paraseptal emphysema.  Myoview 03/25/16 Medium size, moderate intensity (SDS 5) reversible inferior perfusion defect which could suggest ischemia. Study was not gated due to frequent ventricular ectopy. This is an intermediate risk study.  TEE 10/28/15 EF 55%, normal wall motion, grade 4 plaque descending thoracic aorta, trivial MR, normal RVSF, no right-to-left atrial shunt  Event monitor 8/16 Sinus rhythm Rare  pacs and pvcs No sustained arrhythmias No afib  Echo 6/16 Mild LVH, EF 85%, normal wall motion, mildly reduced RVSF, PASP 37 mmHg  Past Medical History:  Diagnosis Date  . Back pain    improved with gabapentin as of 2015  . Barrett esophagus 09/2003  . Coronary artery calcification seen on CT scan   . Diverticulitis   . Diverticulosis   . GERD (gastroesophageal reflux disease)   . GI AVM (gastrointestinal arteriovenous vascular malformation)   . Hearing loss   . Hemorrhoids   . History of Holter monitoring    a. holter 9/17: NSR, PVCs; no sustained arrhythmia; PVC burden 8%  . History of nuclear stress test    a. Myoview 8/17: Intermediate risk, inferior ischemia, not gated  . History of prostatitis   . History of stroke   . Hyperlipidemia   . Hypertension   . Macular degeneration, wet (HCC)    receiving intra-ocular injections ([facility])  . PUD (peptic ulcer disease)     Past Surgical History:  Procedure Laterality Date  . EP IMPLANTABLE DEVICE N/A 12/24/2015   Procedure: Loop Recorder Insertion;  Surgeon: Hillis Range, MD;  Location: [facility] INVASIVE CV LAB;  Service: Cardiovascular;  Laterality: N/A;  . INGUINAL HERNIA REPAIR  2001  . LAPAROSCOPIC APPENDECTOMY N/A 07/07/2014   Procedure: APPENDECTOMY LAPAROSCOPIC;  Surgeon: Manus Rudd, MD;  Location: [facility] OR;  Service: General;  Laterality: N/A;  . TEE WITHOUT CARDIOVERSION N/A 10/28/2015   Procedure: TRANSESOPHAGEAL ECHOCARDIOGRAM (TEE);  Surgeon: Laurey Morale, MD;  Location: [facility]  ENDOSCOPY;  Service: Cardiovascular;  Laterality: N/A;    Current Medications: Current Meds  Medication Sig  . Bevacizumab (AVASTIN IV) Place 1 Applicatorful into both eyes See admin instructions. Right eye every 7 weeks Left eye every 7 weeks  . cetirizine (ZYRTEC) 10 MG tablet Take 10 mg by mouth daily as needed for allergies.   [name] Kitchen clopidogrel (PLAVIX) 75 MG tablet Take 1 tablet (75 mg total) by mouth daily.  [name] Kitchen diltiazem (CARDIZEM CD) 180 MG 24  hr capsule TAKE ONE (1) CAPSULE BY MOUTH EACH DAY  . ferrous sulfate 325 (65 FE) MG tablet Take 1 tablet (325 mg total) by mouth daily with breakfast.  . fluticasone (FLONASE) 50 MCG/ACT nasal spray Place 2 sprays into both nostrils daily as needed for allergies.  [name] Kitchen gabapentin (NEURONTIN) 300 MG capsule TAKE ONE (1) CAPSULE BY MOUTH 2 TIMES DAILY  . gemfibrozil (LOPID) 600 MG tablet Take 1 tablet (600 mg total) by mouth 2 (two) times daily.  [name] Kitchen guaiFENesin (ROBITUSSIN) 100 MG/5ML SOLN Take 5 mLs (100 mg total) by mouth every 6 (six) hours as needed for cough.  . hydrochlorothiazide (HYDRODIURIL) 25 MG tablet Take 1 tablet (25 mg total) by mouth daily.  . Hypromellose (ARTIFICIAL TEARS OP) Apply 1 drop to eye daily as needed (itchy / watery eyes).  [name] Kitchen loperamide (IMODIUM) 2 MG capsule Take 1 capsule (2 mg total) by mouth 3 (three) times daily as needed for diarrhea or loose stools.  . meclizine (ANTIVERT) 25 MG tablet Take 0.5 tablets (12.5 mg total) by mouth 3 (three) times daily as needed for dizziness.  . Multiple Vitamins-Minerals (OCUVITE ADULT 50+ PO) Take 1 capsule by mouth daily.    . nitroGLYCERIN (NITROSTAT) 0.4 MG SL tablet Place 1 tablet (0.4 mg total) under the tongue every 5 (five) minutes as needed for chest pain (max 3 doses in 15 minutes).  . pantoprazole (PROTONIX) 40 MG tablet Take 1 tablet (40 mg total) by mouth every morning.  [name] Kitchen PRALUENT 150 MG/ML SOPN INJECT 1 SYRINGE INTO THE SKIN ONCE EVERY 14 DAYS  . ranitidine (ZANTAC) 150 MG tablet TAKE 1 TABLET BY MOUTH TWICE A DAY  . rOPINIRole (REQUIP) 1 MG tablet Take 1 tablet (1 mg total) by mouth at bedtime.  . sucralfate (CARAFATE) 1 g tablet TAKE 1 TABLET BY MOUTH BEFORE LAYING DOWN FOR [place] NEEDED  . tamsulosin (FLOMAX) 0.4 MG CAPS capsule Take 0.4 mg by mouth daily.   . traMADol (ULTRAM) 50 MG tablet TAKE ONE TABLET BY MOUTH EVERY EIGHT HOURS AS NEEDED FOR MODERATE TO SEVERE PAIN  . vitamin B-12 (CYANOCOBALAMIN) 1000 MCG  tablet Take 2 tablets (2,000 mcg total) by mouth daily.     Allergies:   Angiotensin receptor blockers; Atorvastatin; Codeine; Lisinopril; and Statins   Social History   Tobacco Use  . Smoking status: Former Smoker    Packs/day: 0.00    Years: 36.00    Pack years: 0.00    Types: Cigarettes  . Smokeless tobacco: Never Used  . Tobacco comment: Quit in 1983  Substance Use Topics  . Alcohol use: No    Alcohol/week: 0.0 oz  . Drug use: No     Family Hx: The patient's family history includes Arthritis in his sister; Bleeding Disorder in his son; Cancer in his brother and mother; Coronary artery disease in his father; Heart attack in his father; Hyperlipidemia in his sister; Hypertension in his mother; Pancreatic cancer in his mother; Prostate cancer in his brother. There  is no history of Diabetes.  ROS:   Please see the history of present illness.    ROS All other systems reviewed and are negative.   EKGs/Labs/Other Test Reviewed:    EKG:  EKG is  ordered today.  The ekg ordered today demonstrates normal sinus rhythm, heart rate 76, normal axis, PVCs, QTC 443 ms, no significant change when compared to prior tracing  Recent Labs: 10/10/2017: ALT 9; BUN 17; Creatinine, Ser 1.11; Hemoglobin 14.6; Platelets 289.0; Potassium 4.1; Sodium 139   Recent Lipid Panel Lab Results  Component Value Date/Time   CHOL 136 10/10/2017 01:17 PM   TRIG 115.0 10/10/2017 01:17 PM   HDL 48.50 10/10/2017 01:17 PM   CHOLHDL 3 10/10/2017 01:17 PM   LDLCALC 65 10/10/2017 01:17 PM   LDLDIRECT 167.2 08/12/2013 10:24 AM    Physical Exam:    VS:  BP 140/60   Pulse 76   Ht 5' 6.5" (1.689 m)   Wt 163 lb 12.8 oz (74.3 kg)   BMI 26.04 kg/m     Wt Readings from Last 3 Encounters:  10/31/17 163 lb 12.8 oz (74.3 kg)  10/27/17 155 lb 12 oz (70.6 kg)  10/10/17 155 lb 12 oz (70.6 kg)     Physical Exam  Constitutional: He is oriented to person, place, and time. He appears well-developed and  well-nourished. No distress.  HENT:  Head: Normocephalic and atraumatic.  Neck: No JVD present.  Cardiovascular: Normal rate. A regularly irregular rhythm present.  No murmur heard. Pulmonary/Chest: Effort normal. He has no rales.  Abdominal: Soft.  Musculoskeletal: He exhibits no edema.  Neurological: He is alert and oriented to person, place, and time.  Skin: Skin is warm and dry.    ASSESSMENT & PLAN:    #1.  Coronary artery disease Coronary calcification noted on prior CT.  Nuclear stress test in August 2017 with inferior ischemia.  Medical therapy recommended given advanced age and lack of symptoms.  He is intolerant to statins.  Continue PCSK-9 inhibitor, clopidogrel, diltiazem.  #2.  History of stroke Status post ILR.  No atrial fibrillation identified to date.  Continue clopidogrel.  #3.  Essential hypertension Blood pressure somewhat borderline today.  It is usually well controlled.  Continue to monitor for now.  #4.  Mixed hyperlipidemia LDL optimal on most recent lab work.  Continue current Rx.     Dispo:  Return in about 1 year (around 11/01/2018) for Routine Follow Up, w/ Tereso NewcomerScott Claude Swendsen, PA-C.   Medication Adjustments/Labs and Tests Ordered: Current medicines are reviewed at length with the patient today.  Concerns regarding medicines are outlined above.  Tests Ordered: Orders Placed This Encounter  Procedures  . EKG 12-Lead   Medication Changes: No orders of the defined types were placed in this encounter.   Signed, Tereso NewcomerScott Lealon Vanputten, PA-C  10/31/2017 10:36 AM    [facility] [address], [place], [place]  [address] Phone: [phone]) [phone]; Fax: [phone]

## 2017-11-08 ENCOUNTER — Telehealth: Payer: Self-pay | Admitting: *Deleted

## 2017-11-08 NOTE — Telephone Encounter (Signed)
Manual transmission received.  All 4 available "AF" episodes appear SR/ST w/PACs and PVCs, not AF.  ECGs printed and placed in Dr. Alexander BergeronAllred's LINQ folder for review.

## 2017-11-08 NOTE — Telephone Encounter (Signed)
Spoke with patient's wife to request manual [employer] transmission for review (received alert for "AF" episodes that did not transmit automatically).  She will attempt to send a transmission later today and I will call her back if not received.  Patient's wife is agreeable to plan and denies additional questions at this time.

## 2017-11-13 ENCOUNTER — Other Ambulatory Visit: Payer: Self-pay | Admitting: Internal Medicine

## 2017-11-20 ENCOUNTER — Ambulatory Visit (INDEPENDENT_AMBULATORY_CARE_PROVIDER_SITE_OTHER): Payer: PPO | Admitting: *Deleted

## 2017-11-20 DIAGNOSIS — I63429 Cerebral infarction due to embolism of unspecified anterior cerebral artery: Secondary | ICD-10-CM

## 2017-11-20 NOTE — Progress Notes (Signed)
Carelink Summary Report / Loop Recorder 

## 2017-11-22 LAB — CUP PACEART REMOTE DEVICE CHECK
Date Time Interrogation Session: 20190227014201
Implantable Pulse Generator Implant Date: 20170504

## 2017-11-23 ENCOUNTER — Telehealth: Payer: Self-pay | Admitting: *Deleted

## 2017-11-23 NOTE — Telephone Encounter (Signed)
Spoke with patient's wife (DPR) to request manual transmission for review.  She reports they will send a transmission later today.  She denies questions or concerns at this time.

## 2017-11-23 NOTE — Telephone Encounter (Signed)
Spoke w/ pt wife and informed her that the remote transmission was received. Device Tech RN will review with MD and if there is any changes she will call them back otherwise no news is good news. Pt wife verbalized understanding.

## 2017-12-06 DIAGNOSIS — H353221 Exudative age-related macular degeneration, left eye, with active choroidal neovascularization: Secondary | ICD-10-CM | POA: Diagnosis not present

## 2017-12-06 DIAGNOSIS — H353211 Exudative age-related macular degeneration, right eye, with active choroidal neovascularization: Secondary | ICD-10-CM | POA: Diagnosis not present

## 2017-12-07 DIAGNOSIS — H353221 Exudative age-related macular degeneration, left eye, with active choroidal neovascularization: Secondary | ICD-10-CM | POA: Diagnosis not present

## 2017-12-15 ENCOUNTER — Telehealth: Payer: Self-pay | Admitting: *Deleted

## 2017-12-15 NOTE — Telephone Encounter (Signed)
Spoke with patient's wife (DPR) to request manual [employer]link transmission for review.  She agrees to transmit later today.   Received alert for 5 "AF" episodes--available ECG appears SR w/PACs and PVCs.  Will review additional episodes when transmission received.

## 2017-12-15 NOTE — Telephone Encounter (Signed)
Manual transmission received.  All "AF" episodes false--ECGs appear SR/ST with ectopy.  ECGs printed and placed in Dr. Alexander BergeronAllred's LINQ folder for review.

## 2017-12-20 DIAGNOSIS — R351 Nocturia: Secondary | ICD-10-CM | POA: Diagnosis not present

## 2017-12-20 DIAGNOSIS — R350 Frequency of micturition: Secondary | ICD-10-CM | POA: Diagnosis not present

## 2017-12-22 ENCOUNTER — Ambulatory Visit (INDEPENDENT_AMBULATORY_CARE_PROVIDER_SITE_OTHER): Payer: PPO | Admitting: *Deleted

## 2017-12-22 ENCOUNTER — Emergency Department (HOSPITAL_COMMUNITY): Payer: PPO

## 2017-12-22 ENCOUNTER — Encounter (HOSPITAL_COMMUNITY): Payer: Self-pay | Admitting: Emergency Medicine

## 2017-12-22 ENCOUNTER — Emergency Department (HOSPITAL_COMMUNITY)
Admission: EM | Admit: 2017-12-22 | Discharge: 2017-12-22 | Disposition: A | Discharge status: Home / Self Care | Payer: PPO | Attending: Emergency Medicine | Admitting: Emergency Medicine

## 2017-12-22 DIAGNOSIS — Y929 Unspecified place or not applicable: Secondary | ICD-10-CM | POA: Insufficient documentation

## 2017-12-22 DIAGNOSIS — Y939 Activity, unspecified: Secondary | ICD-10-CM | POA: Insufficient documentation

## 2017-12-22 DIAGNOSIS — Z23 Encounter for immunization: Secondary | ICD-10-CM | POA: Insufficient documentation

## 2017-12-22 DIAGNOSIS — S0181XA Laceration without foreign body of other part of head, initial encounter: Secondary | ICD-10-CM | POA: Insufficient documentation

## 2017-12-22 DIAGNOSIS — Z87891 Personal history of nicotine dependence: Secondary | ICD-10-CM | POA: Insufficient documentation

## 2017-12-22 DIAGNOSIS — J449 Chronic obstructive pulmonary disease, unspecified: Secondary | ICD-10-CM | POA: Diagnosis not present

## 2017-12-22 DIAGNOSIS — S199XXA Unspecified injury of neck, initial encounter: Secondary | ICD-10-CM | POA: Diagnosis not present

## 2017-12-22 DIAGNOSIS — Y999 Unspecified external cause status: Secondary | ICD-10-CM | POA: Diagnosis not present

## 2017-12-22 DIAGNOSIS — S6992XA Unspecified injury of left wrist, hand and finger(s), initial encounter: Secondary | ICD-10-CM | POA: Diagnosis not present

## 2017-12-22 DIAGNOSIS — S8991XA Unspecified injury of right lower leg, initial encounter: Secondary | ICD-10-CM | POA: Diagnosis not present

## 2017-12-22 DIAGNOSIS — I10 Essential (primary) hypertension: Secondary | ICD-10-CM | POA: Diagnosis not present

## 2017-12-22 DIAGNOSIS — W19XXXA Unspecified fall, initial encounter: Secondary | ICD-10-CM

## 2017-12-22 DIAGNOSIS — S0990XA Unspecified injury of head, initial encounter: Secondary | ICD-10-CM | POA: Diagnosis not present

## 2017-12-22 DIAGNOSIS — S61217A Laceration without foreign body of left little finger without damage to nail, initial encounter: Secondary | ICD-10-CM | POA: Diagnosis not present

## 2017-12-22 DIAGNOSIS — I63429 Cerebral infarction due to embolism of unspecified anterior cerebral artery: Secondary | ICD-10-CM | POA: Diagnosis not present

## 2017-12-22 DIAGNOSIS — Z79899 Other long term (current) drug therapy: Secondary | ICD-10-CM | POA: Diagnosis not present

## 2017-12-22 DIAGNOSIS — S61412A Laceration without foreign body of left hand, initial encounter: Secondary | ICD-10-CM

## 2017-12-22 DIAGNOSIS — W0110XA Fall on same level from slipping, tripping and stumbling with subsequent striking against unspecified object, initial encounter: Secondary | ICD-10-CM | POA: Diagnosis not present

## 2017-12-22 MED ORDER — LIDOCAINE-EPINEPHRINE (PF) 2 %-1:200000 IJ SOLN
20.0000 mL | Freq: Once | INTRAMUSCULAR | Status: AC
  Administered 2017-12-22: 20 mL
  Filled 2017-12-22: qty 20

## 2017-12-22 MED ORDER — TETANUS-DIPHTH-ACELL PERTUSSIS 5-2.5-18.5 LF-MCG/0.5 IM SUSP
0.5000 mL | Freq: Once | INTRAMUSCULAR | Status: AC
  Administered 2017-12-22: 0.5 mL via INTRAMUSCULAR
  Filled 2017-12-22: qty 0.5

## 2017-12-22 MED ORDER — ACETAMINOPHEN 500 MG PO TABS
1000.0000 mg | ORAL_TABLET | Freq: Once | ORAL | Status: AC
  Administered 2017-12-22: 1000 mg via ORAL
  Filled 2017-12-22: qty 2

## 2017-12-22 MED ORDER — LIDOCAINE HCL (PF) 1 % IJ SOLN
INTRAMUSCULAR | Status: AC
  Administered 2017-12-22: 06:00:00
  Filled 2017-12-22: qty 30

## 2017-12-22 MED ORDER — LIDOCAINE HCL (PF) 1 % IJ SOLN
INTRAMUSCULAR | Status: AC
  Administered 2017-12-22: 30 mL via INTRADERMAL
  Filled 2017-12-22: qty 30

## 2017-12-22 MED ORDER — LIDOCAINE HCL (PF) 1 % IJ SOLN
30.0000 mL | Freq: Once | INTRAMUSCULAR | Status: AC
Start: 2017-12-22 — End: 2017-12-22
  Administered 2017-12-22: 30 mL via INTRADERMAL

## 2017-12-22 NOTE — ED Notes (Signed)
Pt son out to parking lot to "take pictures for proof" for his attorney so "he can sue" the hospital

## 2017-12-22 NOTE — ED Provider Notes (Signed)
[facility] EMERGENCY DEPARTMENT Provider Note   CSN: [id number] Arrival date & time: 12/22/17  0246     History   Chief Complaint Chief Complaint  Patient presents with  . Fall  . Laceration    HPI [name] is a 84 y.o. male.  Patient presents to the ED with a chief complaint of fall.  He was walking to the car after his wife was discharged from the ED and tripped and fell striking his head.  He did not pass out.  He sustained a laceration to his forehead and left small finger.  He complains of the majority of his pain is in his small finger. He also injured his right shin.  He takes plavix.  The history is provided by the patient. No language interpreter was used.    Past Medical History:  Diagnosis Date  . Back pain    improved with gabapentin as of 2015  . Barrett esophagus 09/2003  . Coronary artery calcification seen on CT scan   . Diverticulitis   . Diverticulosis   . GERD (gastroesophageal reflux disease)   . GI AVM (gastrointestinal arteriovenous vascular malformation)   . Hearing loss   . Hemorrhoids   . History of Holter monitoring    a. holter 9/17: NSR, PVCs; no sustained arrhythmia; PVC burden 8%  . History of nuclear stress test    a. Myoview 8/17: Intermediate risk, inferior ischemia, not gated  . History of prostatitis   . History of stroke   . Hyperlipidemia   . Hypertension   . Macular degeneration, wet (HCC)    receiving intra-ocular injections ([place])  . PUD (peptic ulcer disease)     Patient Active Problem List   Diagnosis Date Noted  . Testicle pain 08/06/2017  . Dysuria 07/04/2017  . Coronary artery disease involving native coronary artery of native heart without angina pectoris 05/04/2017  . Prostatitis 03/28/2017  . Pulse irregularity 03/03/2017  . Cough 02/03/2017  . Abnormal ultrasound 01/11/2017  . Pain in joint, shoulder region 03/30/2016  . Lower back pain 03/30/2016  . Tennis elbow 03/30/2016  . Iron  deficiency 03/07/2016  . Restless leg syndrome 03/04/2016  . Leg pain 01/28/2016  . Vitamin B 12 deficiency 01/28/2016  . Knee pain 12/06/2015  . Fatigue 12/06/2015  . 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/27/2015  . Palpitations 04/03/2015  . Mixed hyperlipidemia 04/03/2015  . Cerebral infarction due to stenosis of cerebral artery (HCC) 04/03/2015  . History of stroke   . Advance care planning 08/21/2014  . Memory change 08/21/2014  . Acute diverticulitis 08/16/2014  . Ectatic abdominal aorta (HCC) 08/16/2014  . COPD exacerbation (HCC)   . Abdominal pain   . GERD (gastroesophageal reflux disease) 05/29/2014  . Left flank pain, chronic 01/22/2013  . Hypokalemia 12/28/2012  . Angioedema of lips 12/28/2012  . Medicare annual wellness visit, subsequent 08/15/2011  . Angiodysplasia of intestine 11/30/2009  . Hearing loss 07/27/2009  . DYSPHAGIA 08/21/2008  . Barrett's esophagus 05/16/2008  . TEMPOROMANDIBULAR JOINT DISORDER 10/16/2007  . UMBILICAL HERNIA 09/22/2007  . Essential hypertension 05/15/2007  . HEMORRHOIDS 05/15/2007    Past Surgical History:  Procedure Laterality Date  . EP IMPLANTABLE DEVICE N/A 12/24/2015   Procedure: Loop Recorder Insertion;  Surgeon: Hillis Range, MD;  Location: [facility] INVASIVE CV LAB;  Service: Cardiovascular;  Laterality: N/A;  . INGUINAL HERNIA REPAIR  2001  . LAPAROSCOPIC APPENDECTOMY N/A 07/07/2014   Procedure: APPENDECTOMY LAPAROSCOPIC;  Surgeon: Manus Rudd, MD;  Location:  [facility] OR;  Service: General;  Laterality: N/A;  . TEE WITHOUT CARDIOVERSION N/A 10/28/2015   Procedure: TRANSESOPHAGEAL ECHOCARDIOGRAM (TEE);  Surgeon: Laurey Morale, MD;  Location: [facility] ENDOSCOPY;  Service: Cardiovascular;  Laterality: N/A;        Home Medications    Prior to Admission medications   Medication Sig Start Date End Date Taking? Authorizing Provider  Bevacizumab (AVASTIN IV) Place 1 Applicatorful into both eyes See admin instructions. Right eye every 7 weeks Left eye every 7  weeks   Yes [provider]  cetirizine (ZYRTEC) 10 MG tablet Take 10 mg by mouth daily as needed for allergies.    Yes [provider]  clopidogrel (PLAVIX) 75 MG tablet Take 1 tablet (75 mg total) by mouth daily. 10/27/17  Yes Joaquim Nam, MD  diltiazem (CARDIZEM CD) 180 MG 24 hr capsule TAKE ONE (1) CAPSULE BY MOUTH EACH DAY 10/27/17  Yes Joaquim Nam, MD  ferrous sulfate 325 (65 FE) MG tablet Take 1 tablet (325 mg total) by mouth daily with breakfast. 04/05/17  Yes Joaquim Nam, MD  fluticasone [facility]) 50 MCG/ACT nasal spray Place 2 sprays into both nostrils daily as needed for allergies. 08/21/15  Yes Joaquim Nam, MD  gabapentin (NEURONTIN) 300 MG capsule TAKE ONE (1) CAPSULE BY MOUTH 2 TIMES DAILY 10/27/17  Yes Joaquim Nam, MD  gemfibrozil (LOPID) 600 MG tablet Take 1 tablet (600 mg total) by mouth 2 (two) times daily. 10/27/17  Yes Joaquim Nam, MD  hydrochlorothiazide (HYDRODIURIL) 25 MG tablet Take 1 tablet (25 mg total) by mouth daily. 10/27/17  Yes Joaquim Nam, MD  Hypromellose (ARTIFICIAL TEARS OP) Apply 1 drop to eye daily as needed (itchy / watery eyes).   Yes [provider]  loperamide (IMODIUM) 2 MG capsule Take 1 capsule (2 mg total) by mouth 3 (three) times daily as needed for diarrhea or loose stools. 07/15/14  Yes Hongalgi, Maximino Green[place], MD  meclizine (ANTIVERT) 25 MG tablet Take 0.5 tablets (12.5 mg total) by mouth 3 (three) times daily as needed for dizziness. 12/03/15  Yes Joaquim Nam, MD  Multiple Vitamins-Minerals (OCUVITE ADULT 50+ PO) Take 1 capsule by mouth daily.     Yes [provider]  nitroGLYCERIN (NITROSTAT) 0.4 MG SL tablet Place 1 tablet (0.4 mg total) under the tongue every 5 (five) minutes as needed for chest pain (max 3 doses in 15 minutes). 03/29/16  Yes Joaquim Nam, MD  pantoprazole (PROTONIX) 40 MG tablet Take 1 tablet (40 mg total) by mouth every morning. Patient taking differently: Take 40 mg by  mouth daily.  10/27/17  Yes Joaquim Nam, MD  potassium chloride (K-DUR) 10 MEQ tablet Take 1 tablet (10 mEq total) by mouth daily. 05/05/17 12/22/25 Yes Weaver, Scott T, PA-C  PRALUENT 150 MG/ML SOPN INJECT 1 SYRINGE INTO THE SKIN ONCE EVERY 14 DAYS 11/29/16  Yes Joaquim Nam, MD  ranitidine (ZANTAC) 150 MG tablet TAKE 1 TABLET BY MOUTH TWICE A DAY 09/25/17  Yes Joaquim Nam, MD  rOPINIRole (REQUIP) 1 MG tablet Take 1 tablet (1 mg total) by mouth at bedtime. 10/27/17  Yes Joaquim Nam, MD  sucralfate (CARAFATE) 1 g tablet TAKE 1 TABLET BY MOUTH BEFORE LAYING DOWN FOR [place] NEEDED   Yes [provider]  tamsulosin (FLOMAX) 0.4 MG CAPS capsule Take 0.4 mg by mouth daily.  10/26/15  Yes [provider]  traMADol (ULTRAM) 50 MG tablet TAKE  ONE TABLET BY MOUTH EVERY EIGHT HOURS AS NEEDED FOR MODERATE TO SEVERE PAIN 09/26/17  Yes Joaquim Nam, MD  vitamin B-12 (CYANOCOBALAMIN) 1000 MCG tablet Take 2 tablets (2,000 mcg total) by mouth daily. 01/28/16  Yes Joaquim Nam, MD  guaiFENesin (ROBITUSSIN) 100 MG/5ML SOLN Take 5 mLs (100 mg total) by mouth every 6 (six) hours as needed for cough. Patient not taking: Reported on 12/22/2017 02/01/17   Joaquim Nam, MD  lisinopril (PRINIVIL,ZESTRIL) 10 MG tablet Take 1 tablet (10 mg total) by mouth daily. 12/20/12 12/22/12  Lorre Munroe, NP    Family History Family History  Problem Relation Age of Onset  . Heart attack Father   . Coronary artery disease Father   . Hypertension Mother   . Pancreatic cancer Mother        Died at 61.  . Cancer Mother        Pancreatic cancer  . Arthritis Sister   . Hyperlipidemia Sister   . Prostate cancer Brother   . Cancer Brother        Bone Cancer secondary to Prostate Cancer  . Bleeding Disorder Son   . Diabetes Neg Hx     Social History Social History   Tobacco Use  . Smoking status: Former Smoker    Packs/day: 0.00    Years: 36.00    Pack years: 0.00    Types: Cigarettes  .  Smokeless tobacco: Never Used  . Tobacco comment: Quit in 1983  Substance Use Topics  . Alcohol use: No    Alcohol/week: 0.0 oz  . Drug use: No     Allergies   Angiotensin receptor blockers; Atorvastatin; Codeine; Lisinopril; and Statins   Review of Systems Review of Systems  All other systems reviewed and are negative.    Physical Exam Updated Vital Signs BP (!) 181/76   Pulse 78   Temp 98.5 F (36.9 C) (Oral)   Resp 16   SpO2 97%   Physical Exam  Constitutional: He is oriented to person, place, and time. He appears well-developed and well-nourished.  HENT:  Head: Normocephalic and atraumatic.  4 cm gaping laceration on left forehead  Eyes: Pupils are equal, round, and reactive to light. Conjunctivae and EOM are normal. Right eye exhibits no discharge. Left eye exhibits no discharge. No scleral icterus.  Neck: Normal range of motion. Neck supple. No JVD present.  Cardiovascular: Normal rate, regular rhythm and normal heart sounds. Exam reveals no gallop and no friction rub.  No murmur heard. Pulmonary/Chest: Effort normal and breath sounds normal. No respiratory distress. He has no wheezes. He has no rales. He exhibits no tenderness.  Abdominal: Soft. He exhibits no distension and no mass. There is no tenderness. There is no rebound and no guarding.  Musculoskeletal: Normal range of motion. He exhibits no edema or tenderness.  Finger extension intact of left little finger, isolated at each joint  Neurological: He is alert and oriented to person, place, and time.  Skin: Skin is warm and dry.  4 cm laceration of left little finger  Psychiatric: He has a normal mood and affect. His behavior is normal. Judgment and thought content normal.  Nursing note and vitals reviewed.    ED Treatments / Results  Labs (all labs ordered are listed, but only abnormal results are displayed) Labs Reviewed - No data to display  EKG None  Radiology Ct Head Wo Contrast  Result  Date: 12/22/2017 CLINICAL DATA:  84 year old male with head  trauma. EXAM: CT HEAD WITHOUT CONTRAST CT CERVICAL SPINE WITHOUT CONTRAST TECHNIQUE: Multidetector CT imaging of the head and cervical spine was performed following the standard protocol without intravenous contrast. Multiplanar CT image reconstructions of the cervical spine were also generated. COMPARISON:  Brain MRI dated 03/29/2015 FINDINGS: CT HEAD FINDINGS Brain: There is moderate age-related atrophy and chronic microvascular ischemic changes. There is no acute intracranial hemorrhage. No mass effect or midline shift. No extra-axial fluid collection. Vascular: No hyperdense vessel or unexpected calcification. Skull: Normal. Negative for fracture or focal lesion. Sinuses/Orbits: There is opacification of the left mastoid air cells. The right mastoid air cells and paranasal sinuses are clear. Other: Left forehead scalp laceration. CT CERVICAL SPINE FINDINGS Alignment: No acute subluxation. Skull base and vertebrae: No acute fracture. Soft tissues and spinal canal: No prevertebral fluid or swelling. No visible canal hematoma. Disc levels: Multilevel degenerative changes with endplate irregularity and disc space narrowing primarily at C5-C7. Upper chest: Mild paraseptal emphysema Other: Bilateral carotid bulb calcified plaques. IMPRESSION: 1. No acute intracranial hemorrhage. 2. Age-related atrophy and chronic microvascular ischemic changes. 3. No acute/traumatic cervical spine pathology. Electronically Signed   By: Elgie Collard M.D.   On: 12/22/2017 03:48   Ct Cervical Spine Wo Contrast  Result Date: 12/22/2017 CLINICAL DATA:  84 year old male with head trauma. EXAM: CT HEAD WITHOUT CONTRAST CT CERVICAL SPINE WITHOUT CONTRAST TECHNIQUE: Multidetector CT imaging of the head and cervical spine was performed following the standard protocol without intravenous contrast. Multiplanar CT image reconstructions of the cervical spine were also generated.  COMPARISON:  Brain MRI dated 03/29/2015 FINDINGS: CT HEAD FINDINGS Brain: There is moderate age-related atrophy and chronic microvascular ischemic changes. There is no acute intracranial hemorrhage. No mass effect or midline shift. No extra-axial fluid collection. Vascular: No hyperdense vessel or unexpected calcification. Skull: Normal. Negative for fracture or focal lesion. Sinuses/Orbits: There is opacification of the left mastoid air cells. The right mastoid air cells and paranasal sinuses are clear. Other: Left forehead scalp laceration. CT CERVICAL SPINE FINDINGS Alignment: No acute subluxation. Skull base and vertebrae: No acute fracture. Soft tissues and spinal canal: No prevertebral fluid or swelling. No visible canal hematoma. Disc levels: Multilevel degenerative changes with endplate irregularity and disc space narrowing primarily at C5-C7. Upper chest: Mild paraseptal emphysema Other: Bilateral carotid bulb calcified plaques. IMPRESSION: 1. No acute intracranial hemorrhage. 2. Age-related atrophy and chronic microvascular ischemic changes. 3. No acute/traumatic cervical spine pathology. Electronically Signed   By: Elgie Collard M.D.   On: 12/22/2017 03:48    Procedures Procedures (including critical care time) LACERATION REPAIR Performed by: Roxy Horseman Authorized by: Roxy Horseman Consent: Verbal consent obtained. Risks and benefits: risks, benefits and alternatives were discussed Consent given by: patient Patient identity confirmed: provided demographic data Prepped and Draped in normal sterile fashion Wound explored  Laceration Location: left forehead  Laceration Length: 4 cm  No Foreign Bodies seen or palpated  Anesthesia: local infiltration  Local anesthetic: lidocaine 1% with epinephrine  Anesthetic total: 5 ml  Irrigation method: syringe Amount of cleaning: standard  Skin closure: 5-0 prolene  Number of sutures: 8  Technique: interrupted and figure of  eight  Patient tolerance: Patient tolerated the procedure well with no immediate complications.   LACERATION REPAIR Performed by: Roxy Horseman Authorized by: Roxy Horseman Consent: Verbal consent obtained. Risks and benefits: risks, benefits and alternatives were discussed Consent given by: patient Patient identity confirmed: provided demographic data Prepped and Draped in normal sterile fashion Wound explored  Laceration Location: left small finger  Laceration Length: 4 cm  No Foreign Bodies seen or palpated  Anesthesia: digital block  Local anesthetic: lidocaine 1% without epinephrine  Anesthetic total: 5 ml  Irrigation method: syringe Amount of cleaning: standard  Skin closure: 4-0 prolene  Number of sutures: 4  Technique: interrupted  Patient tolerance: Patient tolerated the procedure well with no immediate complications.  Medications Ordered in ED Medications  lidocaine-EPINEPHrine (XYLOCAINE W/EPI) 2 %-1:200000 (PF) injection 20 mL (20 mLs Infiltration Given 12/22/17 0355)  Tdap (BOOSTRIX) injection 0.5 mL (0.5 mLs Intramuscular Given 12/22/17 0356)  lidocaine (PF) (XYLOCAINE) 1 % injection 30 mL (30 mLs Intradermal Given 12/22/17 0356)     Initial Impression / Assessment and Plan / ED Course  I have reviewed the triage vital signs and the nursing notes.  Pertinent labs & imaging results that were available during my care of the patient were reviewed by me and considered in my medical decision making (see chart for details).    Patient with mechanical fall.  Hit head.  Did not pass out.  Neurovascularly intact.    Tdap updated.  Imaging negative.  A&Ox4.  Lacerations repaired.  DC to home with PCP follow-up.   Final Clinical Impressions(s) / ED Diagnoses   Final diagnoses:  Fall, initial encounter  Injury of head, initial encounter  Laceration of left hand without foreign body, initial encounter  Facial laceration, initial encounter    ED  Discharge Orders    None       Roxy Horseman, PA-C 12/22/17 1610    Ward, Layla Maw, DO 12/22/17 [phone]

## 2017-12-22 NOTE — ED Notes (Signed)
Consulting civil engineer Lewie Loron) aware

## 2017-12-22 NOTE — ED Notes (Signed)
Patient's head wound covered with vaseline gauze, then 4x4's and coban for compression. Ortho applied finger splin\t

## 2017-12-22 NOTE — Discharge Instructions (Addendum)
You had 8 sutures placed in your forehead and  4 placed in your small finger.  You will need to have these taken out by your doctor in 1 week.  Please continue taking all of your medications as directed.    Please return for slurred speech, vision changes, or difficulty moving your arms or legs.  Take Tylenol for pain every 6 hours.  Apply ice to your bruises.

## 2017-12-22 NOTE — ED Notes (Signed)
Pt son here to pick up pt, he states "me and an attorney will be back here, something will be done to make this hospital pay for him tripping and falling"

## 2017-12-22 NOTE — ED Notes (Signed)
Pt is on PLAVIX

## 2017-12-22 NOTE — ED Triage Notes (Signed)
Pt wife just DC from ED, pt was walking to his car when he tripped and fell hitting head. Deep laceration noted to above L eye, abrasion to bridge of nose, abrasion to L hand. Pt denies LOC, denies thinners. A/OX4

## 2017-12-25 LAB — CUP PACEART REMOTE DEVICE CHECK
Date Time Interrogation Session: 20190401020607
Implantable Pulse Generator Implant Date: 20170504

## 2017-12-25 NOTE — Progress Notes (Signed)
Carelink Summary Report / Loop Recorder 

## 2017-12-28 ENCOUNTER — Other Ambulatory Visit: Payer: Self-pay | Admitting: Internal Medicine

## 2017-12-29 ENCOUNTER — Ambulatory Visit (INDEPENDENT_AMBULATORY_CARE_PROVIDER_SITE_OTHER): Payer: PPO | Admitting: Family Medicine

## 2017-12-29 ENCOUNTER — Encounter: Payer: Self-pay | Admitting: Family Medicine

## 2017-12-29 VITALS — BP 130/64 | HR 69 | Temp 97.6°F | Ht 66.5 in | Wt 157.2 lb

## 2017-12-29 DIAGNOSIS — Z4802 Encounter for removal of sutures: Secondary | ICD-10-CM | POA: Diagnosis not present

## 2017-12-29 NOTE — Patient Instructions (Signed)
Change the bandage as needed.   The strips should curl up- trim the ends and then it should eventually come off on its own.   Update me as needed.  Take care.  Glad to see you.

## 2017-12-29 NOTE — Progress Notes (Signed)
He was at the ER with his wife- she was seen as a patient and she was to be discharged.  At that point, he was not yet a patient.  He was going to get the Zenaida Niece and take her home, fell and hit his face in the parking lot.  No LOC but was "dazed."  He was transported into the emergency room and was evaluated and treated.  Neg imaging at ER.  Facial laceration was sutured.  Laceration on left hand was sutured.  He was discharged home.  His activities at baseline otherwise.  He does not have other falls.  He has already been back plowing his garden.  He does not have typical postconcussive symptoms with photophobia, headaches, etc.  ROS: Per HPI unless specifically indicated in ROS section   Meds, vitals, and allergies reviewed.   GEN: nad, alert and oriented HEENT: mucous membranes moist, healing laceration on the left side of the forehead noted.  Sutures all easily removed still with good tissue apposition and area was covered with Steri-Strips.  No complication. NECK: supple w/o LA CV: rrr. PULM: ctab, no inc wob ABD: soft, +bs EXT: no edema SKIN: no acute rash Laceration healing on left fifth finger.  All suture material removed.  Still with good tissue apposition.  He still has sensation and motion intact for the finger.  Area was bandaged.

## 2017-12-31 DIAGNOSIS — Z4802 Encounter for removal of sutures: Secondary | ICD-10-CM | POA: Insufficient documentation

## 2017-12-31 NOTE — Assessment & Plan Note (Signed)
Sutures removed.  Routine fall cautions discussed with patient.  Routine wound care discussion with patient.  He understood.  Update me as needed.

## 2018-01-03 ENCOUNTER — Telehealth: Payer: Self-pay | Admitting: Family Medicine

## 2018-01-03 NOTE — Telephone Encounter (Signed)
In that case, stay off it for now.  Med list updated.  Last K level was normal and we'll recheck with future labs.  We can readdress then if low.  Thanks.

## 2018-01-03 NOTE — Telephone Encounter (Signed)
Patient's wife notified as instructed by telephone and verbalized understanding. 

## 2018-01-03 NOTE — Telephone Encounter (Signed)
Copied from CRM [id number]. Topic: Quick Communication - See Telephone Encounter >> Jan 03, 2018  8:47 AM Jolayne Haines L wrote: CRM for notification. See Telephone encounter for: 01/03/18.  [name] ( wife ) called and wanted to let Dr Para March know that when they were in the office on Friday 5/10, they discussed medications. She said Dr Para March wanted to know if he was still taking potassium chloride (K-DUR) 10 MEQ tablet & she said that he is not taking it currently. She said if Dr Para March thinks he needs it, to please call in a new prescription at [facility] [phone] [facility], [place] - [address]

## 2018-01-10 DIAGNOSIS — H353211 Exudative age-related macular degeneration, right eye, with active choroidal neovascularization: Secondary | ICD-10-CM | POA: Diagnosis not present

## 2018-01-16 LAB — CUP PACEART REMOTE DEVICE CHECK
Date Time Interrogation Session: 20190504020637
Implantable Pulse Generator Implant Date: 20170504

## 2018-01-23 ENCOUNTER — Other Ambulatory Visit: Payer: Self-pay | Admitting: Family Medicine

## 2018-01-23 NOTE — Telephone Encounter (Signed)
Electronic refill request.  Tramadol Last office visit:   12/29/17 Last Filled:    60 tablet 2 09/26/2017  Please advise.

## 2018-01-24 ENCOUNTER — Ambulatory Visit (INDEPENDENT_AMBULATORY_CARE_PROVIDER_SITE_OTHER): Payer: PPO | Admitting: *Deleted

## 2018-01-24 DIAGNOSIS — I63429 Cerebral infarction due to embolism of unspecified anterior cerebral artery: Secondary | ICD-10-CM

## 2018-01-24 NOTE — Telephone Encounter (Signed)
Sent. Thanks.   

## 2018-01-25 NOTE — Progress Notes (Signed)
Carelink Summary Report / Loop Recorder 

## 2018-02-01 DIAGNOSIS — H353211 Exudative age-related macular degeneration, right eye, with active choroidal neovascularization: Secondary | ICD-10-CM | POA: Diagnosis not present

## 2018-02-01 DIAGNOSIS — H353221 Exudative age-related macular degeneration, left eye, with active choroidal neovascularization: Secondary | ICD-10-CM | POA: Diagnosis not present

## 2018-02-13 DIAGNOSIS — H353211 Exudative age-related macular degeneration, right eye, with active choroidal neovascularization: Secondary | ICD-10-CM | POA: Diagnosis not present

## 2018-02-26 ENCOUNTER — Ambulatory Visit (INDEPENDENT_AMBULATORY_CARE_PROVIDER_SITE_OTHER): Payer: PPO | Admitting: *Deleted

## 2018-02-26 DIAGNOSIS — I63429 Cerebral infarction due to embolism of unspecified anterior cerebral artery: Secondary | ICD-10-CM

## 2018-02-27 NOTE — Progress Notes (Signed)
Carelink Summary Report / Loop Recorder 

## 2018-03-02 LAB — CUP PACEART REMOTE DEVICE CHECK
Date Time Interrogation Session: 20190606020634
Implantable Pulse Generator Implant Date: 20170504

## 2018-03-13 DIAGNOSIS — H353211 Exudative age-related macular degeneration, right eye, with active choroidal neovascularization: Secondary | ICD-10-CM | POA: Diagnosis not present

## 2018-03-26 ENCOUNTER — Ambulatory Visit (INDEPENDENT_AMBULATORY_CARE_PROVIDER_SITE_OTHER): Payer: PPO | Admitting: Internal Medicine

## 2018-03-26 ENCOUNTER — Ambulatory Visit (INDEPENDENT_AMBULATORY_CARE_PROVIDER_SITE_OTHER)
Admission: RE | Admit: 2018-03-26 | Discharge: 2018-03-26 | Disposition: A | Discharge status: Home / Self Care | Payer: PPO | Source: Ambulatory Visit | Attending: Internal Medicine | Admitting: Internal Medicine

## 2018-03-26 ENCOUNTER — Encounter: Payer: Self-pay | Admitting: Internal Medicine

## 2018-03-26 VITALS — BP 124/76 | HR 79 | Temp 98.0°F | Ht 66.5 in | Wt 156.0 lb

## 2018-03-26 DIAGNOSIS — R109 Unspecified abdominal pain: Secondary | ICD-10-CM

## 2018-03-26 DIAGNOSIS — M533 Sacrococcygeal disorders, not elsewhere classified: Secondary | ICD-10-CM | POA: Insufficient documentation

## 2018-03-26 DIAGNOSIS — R102 Pelvic and perineal pain: Secondary | ICD-10-CM | POA: Diagnosis not present

## 2018-03-26 LAB — POC URINALSYSI DIPSTICK (AUTOMATED)
Bilirubin, UA: NEGATIVE
Blood, UA: NEGATIVE
Glucose, UA: NEGATIVE
Ketones, UA: NEGATIVE
Leukocytes, UA: NEGATIVE
Nitrite, UA: NEGATIVE
Protein, UA: NEGATIVE
Spec Grav, UA: 1.025 (ref 1.010–1.025)
Urobilinogen, UA: 0.2 E.U./dL
pH, UA: 6 (ref 5.0–8.0)

## 2018-03-26 MED ORDER — NITROGLYCERIN 0.4 MG SL SUBL: 0.4000 mg | SUBLINGUAL_TABLET | SUBLINGUAL | 0 refills | Status: DC | PRN

## 2018-03-26 NOTE — Assessment & Plan Note (Addendum)
Severe and highly localized Despite no fall--will check x-ray Urine negative Tramadol not helping  Asked him to try aleve 1 bid for a few days Heating pad If not improving, will set up PT

## 2018-03-26 NOTE — Patient Instructions (Addendum)
Please try aleve (naproxen 220mg ) twice a day with food for a few days. You can continue the tramadol also and tylenol ([phone] mg three times a day) Try heat on the sensitive spot

## 2018-03-26 NOTE — Progress Notes (Signed)
Subjective:    Patient ID: [name], male    DOB: 09-Sep-1925, 84 y.o.   MRN: [id number]  HPI Here with wife due to severe left low back pain Okay if lying down but can't stand or sit for prolonged time Started yesterday with significant pain  Able to walk briefly No radiation up back or down leg Low back was bothering him earlier ---but not as bad now  Has tried tramadol--not really easing it No leg weakness  Current Outpatient Medications on File Prior to Visit  Medication Sig Dispense Refill  . Bevacizumab (AVASTIN IV) Place 1 Applicatorful into both eyes See admin instructions. Right eye every 7 weeks Left eye every 7 weeks    . cetirizine (ZYRTEC) 10 MG tablet Take 10 mg by mouth daily as needed for allergies.     [name] Kitchen clopidogrel (PLAVIX) 75 MG tablet Take 1 tablet (75 mg total) by mouth daily. 90 tablet 3  . diltiazem (CARDIZEM CD) 180 MG 24 hr capsule TAKE ONE (1) CAPSULE BY MOUTH EACH DAY 90 capsule 3  . ferrous sulfate 325 (65 FE) MG tablet Take 1 tablet (325 mg total) by mouth daily with breakfast.    . fluticasone (FLONASE) 50 MCG/ACT nasal spray Place 2 sprays into both nostrils daily as needed for allergies. 16 g 5  . gabapentin (NEURONTIN) 300 MG capsule TAKE ONE (1) CAPSULE BY MOUTH 2 TIMES DAILY 180 capsule 3  . gemfibrozil (LOPID) 600 MG tablet Take 1 tablet (600 mg total) by mouth 2 (two) times daily. 180 tablet 3  . guaiFENesin (ROBITUSSIN) 100 MG/5ML SOLN Take 5 mLs (100 mg total) by mouth every 6 (six) hours as needed for cough.    . hydrochlorothiazide (HYDRODIURIL) 25 MG tablet Take 1 tablet (25 mg total) by mouth daily. 90 tablet 3  . Hypromellose (ARTIFICIAL TEARS OP) Apply 1 drop to eye daily as needed (itchy / watery eyes).    [name] Kitchen loperamide (IMODIUM) 2 MG capsule Take 1 capsule (2 mg total) by mouth 3 (three) times daily as needed for diarrhea or loose stools. 10 capsule 0  . meclizine (ANTIVERT) 25 MG tablet Take 0.5 tablets (12.5 mg total) by mouth 3  (three) times daily as needed for dizziness.    . Multiple Vitamins-Minerals (OCUVITE ADULT 50+ PO) Take 1 capsule by mouth daily.      . pantoprazole (PROTONIX) 40 MG tablet Take 1 tablet (40 mg total) by mouth every morning. (Patient taking differently: Take 40 mg by mouth daily. ) 90 tablet 3  . PRALUENT 150 MG/ML SOPN INJECT 1 SYRINGE INTO THE SKIN ONCE EVERY 14 DAYS 2 pen 5  . ranitidine (ZANTAC) 150 MG tablet TAKE 1 TABLET BY MOUTH TWICE A DAY 180 tablet 1  . rOPINIRole (REQUIP) 1 MG tablet Take 1 tablet (1 mg total) by mouth at bedtime. 90 tablet 3  . sucralfate (CARAFATE) 1 g tablet TAKE 1 TABLET BY MOUTH BEFORE LAYING DOWN FOR NAP ORAS NEEDED    . tamsulosin (FLOMAX) 0.4 MG CAPS capsule Take 0.4 mg by mouth daily.     . traMADol (ULTRAM) 50 MG tablet TAKE ONE TABLET BY MOUTH EVERY EIGHT HOURS AS NEEDED FOR MODERATE TO SEVERE PAIN 60 tablet 2  . vitamin B-12 (CYANOCOBALAMIN) 1000 MCG tablet Take 2 tablets (2,000 mcg total) by mouth daily.     No current facility-administered medications on file prior to visit.     Allergies  Allergen Reactions  . Angiotensin Receptor Blockers Swelling  Lip and face swelling prev noted  . Atorvastatin Other (See Comments)    REACTION: muscles tense up   . Codeine Nausea And Vomiting  . Lisinopril Swelling and Other (See Comments)    Lips and face swelling  . Statins Other (See Comments)    Muscle tense up    Past Medical History:  Diagnosis Date  . Back pain    improved with gabapentin as of 2015  . Barrett esophagus 09/2003  . Coronary artery calcification seen on CT scan   . Diverticulitis   . Diverticulosis   . GERD (gastroesophageal reflux disease)   . GI AVM (gastrointestinal arteriovenous vascular malformation)   . Hearing loss   . Hemorrhoids   . History of Holter monitoring    a. holter 9/17: NSR, PVCs; no sustained arrhythmia; PVC burden 8%  . History of nuclear stress test    a. Myoview 8/17: Intermediate risk, inferior  ischemia, not gated  . History of prostatitis   . History of stroke   . Hyperlipidemia   . Hypertension   . Macular degeneration, wet (HCC)    receiving intra-ocular injections ([facility])  . PUD (peptic ulcer disease)     Past Surgical History:  Procedure Laterality Date  . EP IMPLANTABLE DEVICE N/A 12/24/2015   Procedure: Loop Recorder Insertion;  Surgeon: Hillis Range, MD;  Location: [facility] INVASIVE [facility] LAB;  Service: Cardiovascular;  Laterality: N/A;  . INGUINAL HERNIA REPAIR  2001  . LAPAROSCOPIC APPENDECTOMY N/A 07/07/2014   Procedure: APPENDECTOMY LAPAROSCOPIC;  Surgeon: Manus Rudd, MD;  Location: [facility] OR;  Service: General;  Laterality: N/A;  . TEE WITHOUT CARDIOVERSION N/A 10/28/2015   Procedure: TRANSESOPHAGEAL ECHOCARDIOGRAM (TEE);  Surgeon: Laurey Morale, MD;  Location: [facility] ENDOSCOPY;  Service: Cardiovascular;  Laterality: N/A;    Family History  Problem Relation Age of Onset  . Heart attack Father   . Coronary artery disease Father   . Hypertension Mother   . Pancreatic cancer Mother        Died at 58.  . Cancer Mother        Pancreatic cancer  . Arthritis Sister   . Hyperlipidemia Sister   . Prostate cancer Brother   . Cancer Brother        Bone Cancer secondary to Prostate Cancer  . Bleeding Disorder Son   . Diabetes Neg Hx     Social History   Socioeconomic History  . Marital status: Married    Spouse name: Rosendo Couser  . Number of children: Not on file  . Years of education: Not on file  . Highest education level: Not on file  Occupational History  . Occupation: Retired    Associate Professor: RETIRED  Social Needs  . Financial resource strain: Not on file  . Food insecurity:    Worry: Not on file    Inability: Not on file  . Transportation needs:    Medical: Not on file    Non-medical: Not on file  Tobacco Use  . Smoking status: Former Smoker    Packs/day: 0.00    Years: 36.00    Pack years: 0.00    Types: Cigarettes  . Smokeless tobacco: Never Used  .  Tobacco comment: Quit in 1983  Substance and Sexual Activity  . Alcohol use: No    Alcohol/week: 0.0 oz  . Drug use: No  . Sexual activity: Not on file  Lifestyle  . Physical activity:    Days per week: Not on file  Minutes per session: Not on file  . Stress: Not on file  Relationships  . Social connections:    Talks on phone: Not on file    Gets together: Not on file    Attends religious service: Not on file    Active member of club or organization: Not on file    Attends meetings of clubs or organizations: Not on file    Relationship status: Not on file  . Intimate partner violence:    Fear of current or ex partner: Not on file    Emotionally abused: Not on file    Physically abused: Not on file    Forced sexual activity: Not on file  Other Topics Concern  . Not on file  Social History Narrative   6th grade. Married 1952 1 son, 1 dtr - died at 5 months. Work - Management consultantsaw-mill, Engineer, drillingtractor trailer driver, Catering manageretc.   Retired-fulltime. "[employer] ACP - yes DNR, yes short-term mechanical ventilarion, no heroic or futile measures in face of irreversible.   Review of Systems  Bowels moving fine. No control issues Voiding okay No falls recently--still gets up on backhoe (last time about a week ago)     Objective:   Physical Exam  Constitutional: No distress.  Musculoskeletal:  Striking point tenderness around left S-I area No spine tenderness SLR negative ROM fairly normal in hips  Neurological:  Gait is normal--then will have severe wracking pain           Assessment & Plan:

## 2018-03-29 DIAGNOSIS — H353221 Exudative age-related macular degeneration, left eye, with active choroidal neovascularization: Secondary | ICD-10-CM | POA: Diagnosis not present

## 2018-03-29 DIAGNOSIS — H353211 Exudative age-related macular degeneration, right eye, with active choroidal neovascularization: Secondary | ICD-10-CM | POA: Diagnosis not present

## 2018-03-31 LAB — CUP PACEART REMOTE DEVICE CHECK
Date Time Interrogation Session: 20190709024002
Implantable Pulse Generator Implant Date: 20170504

## 2018-04-02 ENCOUNTER — Ambulatory Visit (INDEPENDENT_AMBULATORY_CARE_PROVIDER_SITE_OTHER): Payer: PPO | Admitting: *Deleted

## 2018-04-02 DIAGNOSIS — I63429 Cerebral infarction due to embolism of unspecified anterior cerebral artery: Secondary | ICD-10-CM | POA: Diagnosis not present

## 2018-04-02 NOTE — Progress Notes (Signed)
Carelink Summary Report / Loop Recorder 

## 2018-04-04 ENCOUNTER — Encounter: Payer: Self-pay | Admitting: Family Medicine

## 2018-04-04 ENCOUNTER — Ambulatory Visit (INDEPENDENT_AMBULATORY_CARE_PROVIDER_SITE_OTHER): Payer: PPO | Admitting: Family Medicine

## 2018-04-04 VITALS — BP 140/70 | HR 74 | Temp 98.0°F | Ht 66.5 in | Wt 160.5 lb

## 2018-04-04 DIAGNOSIS — R1030 Lower abdominal pain, unspecified: Secondary | ICD-10-CM | POA: Diagnosis not present

## 2018-04-04 DIAGNOSIS — K5792 Diverticulitis of intestine, part unspecified, without perforation or abscess without bleeding: Secondary | ICD-10-CM

## 2018-04-04 MED ORDER — AMOXICILLIN-POT CLAVULANATE 875-125 MG PO TABS: 1.0000 | ORAL_TABLET | Freq: Two times a day (BID) | ORAL | 0 refills | Status: DC

## 2018-04-04 NOTE — Patient Instructions (Signed)
Start augmentin.  Clear liquid diet, then try bland foods.  Go to the lab on the way out.  We'll contact you with your lab report. Update us as needed.  Take care.  Glad to see you.

## 2018-04-04 NOTE — Progress Notes (Signed)
Abd pain.  Started a few days ago.  Not better in the meantime. No fevers.  No blood in stool.  Still with BMs in the meantime, not constipated.  No blood in urine.  No pain with urination.  Lower abd pain, midline.  History of diverticulitis in the past. No vomiting.    Meds, vitals, and allergies reviewed.   ROS: Per HPI unless specifically indicated in ROS section   nad ncat Neck supple, no LA rrr ctab Abdomen soft, tender to palpation in the lower midline and left lower quadrant.  No rebound.  Abdomen nontender otherwise.  Normal bowel sounds. Extremities without edema.

## 2018-04-05 ENCOUNTER — Telehealth: Payer: Self-pay | Admitting: *Deleted

## 2018-04-05 LAB — CBC WITH DIFFERENTIAL/PLATELET
Basophils Absolute: 0.1 10*3/uL (ref 0.0–0.1)
Basophils Relative: 0.9 % (ref 0.0–3.0)
Eosinophils Absolute: 0.4 10*3/uL (ref 0.0–0.7)
Eosinophils Relative: 5.8 % — ABNORMAL HIGH (ref 0.0–5.0)
HCT: 39.8 % (ref 39.0–52.0)
Hemoglobin: 13.1 g/dL (ref 13.0–17.0)
Lymphocytes Relative: 23.1 % (ref 12.0–46.0)
Lymphs Abs: 1.6 10*3/uL (ref 0.7–4.0)
MCHC: 33 g/dL (ref 30.0–36.0)
MCV: 88.3 fl (ref 78.0–100.0)
Monocytes Absolute: 0.8 10*3/uL (ref 0.1–1.0)
Monocytes Relative: 11.6 % (ref 3.0–12.0)
Neutro Abs: 4.1 10*3/uL (ref 1.4–7.7)
Neutrophils Relative %: 58.6 % (ref 43.0–77.0)
Platelets: 250 10*3/uL (ref 150.0–400.0)
RBC: 4.5 Mil/uL (ref 4.22–5.81)
RDW: 14.1 % (ref 11.5–15.5)
WBC: 7.1 10*3/uL (ref 4.0–10.5)

## 2018-04-05 LAB — BASIC METABOLIC PANEL
BUN: 26 mg/dL — ABNORMAL HIGH (ref 6–23)
CO2: 26 mEq/L (ref 19–32)
Calcium: 8.9 mg/dL (ref 8.4–10.5)
Chloride: 106 mEq/L (ref 96–112)
Creatinine, Ser: 1.53 mg/dL — ABNORMAL HIGH (ref 0.40–1.50)
GFR: 45.63 mL/min — ABNORMAL LOW (ref >60.00)
Glucose, Bld: 95 mg/dL (ref 70–99)
Potassium: 3.9 mEq/L (ref 3.5–5.1)
Sodium: 141 mEq/L (ref 135–145)

## 2018-04-05 NOTE — Assessment & Plan Note (Signed)
Presumed, likely, still okay for outpatient follow-up.  Start Augmentin.  Check routine labs today.  Defer imaging at this point as the goal would be to limit radiation exposure if possible.  He will update me if worse in the meantime.  Routine cautions given.  Discussed with patient about low residue diet in the meantime. He agreed with plan.

## 2018-04-05 NOTE — Telephone Encounter (Signed)
Okay to try 1-2 times per day if needed.  Would use lowest effective dose.  Thanks.

## 2018-04-05 NOTE — Telephone Encounter (Signed)
Copied from CRM [phone]#[id number]. Topic: Inquiry >> Apr 05, 2018 11:01 AM Yvonna Alanisobinson, Andra M wrote: Reason for CRM: Patient's wife called wanting to know if the patient can take Imodium while he is on an anti-biotic. Please call patient asap at [phone].       Thank You!!!

## 2018-04-05 NOTE — Telephone Encounter (Signed)
Wife advised. 

## 2018-04-06 ENCOUNTER — Other Ambulatory Visit: Payer: Self-pay | Admitting: Family Medicine

## 2018-04-06 DIAGNOSIS — R7989 Other specified abnormal findings of blood chemistry: Secondary | ICD-10-CM

## 2018-04-12 ENCOUNTER — Other Ambulatory Visit (INDEPENDENT_AMBULATORY_CARE_PROVIDER_SITE_OTHER): Payer: PPO

## 2018-04-12 DIAGNOSIS — R7989 Other specified abnormal findings of blood chemistry: Secondary | ICD-10-CM | POA: Diagnosis not present

## 2018-04-12 DIAGNOSIS — E611 Iron deficiency: Secondary | ICD-10-CM

## 2018-04-12 LAB — CBC WITH DIFFERENTIAL/PLATELET
Basophils Absolute: 0 10*3/uL (ref 0.0–0.1)
Basophils Relative: 0.5 % (ref 0.0–3.0)
Eosinophils Absolute: 0.3 10*3/uL (ref 0.0–0.7)
Eosinophils Relative: 4 % (ref 0.0–5.0)
HCT: 40.5 % (ref 39.0–52.0)
Hemoglobin: 13.4 g/dL (ref 13.0–17.0)
Lymphocytes Relative: 16 % (ref 12.0–46.0)
Lymphs Abs: 1.4 10*3/uL (ref 0.7–4.0)
MCHC: 33.2 g/dL (ref 30.0–36.0)
MCV: 87.9 fl (ref 78.0–100.0)
Monocytes Absolute: 0.6 10*3/uL (ref 0.1–1.0)
Monocytes Relative: 7.2 % (ref 3.0–12.0)
Neutro Abs: 6.4 10*3/uL (ref 1.4–7.7)
Neutrophils Relative %: 72.3 % (ref 43.0–77.0)
Platelets: 258 10*3/uL (ref 150.0–400.0)
RBC: 4.61 Mil/uL (ref 4.22–5.81)
RDW: 14.1 % (ref 11.5–15.5)
WBC: 8.8 10*3/uL (ref 4.0–10.5)

## 2018-04-12 LAB — COMPREHENSIVE METABOLIC PANEL
ALT: 8 U/L (ref 0–53)
AST: 12 U/L (ref 0–37)
Albumin: 3.8 g/dL (ref 3.5–5.2)
Alkaline Phosphatase: 97 U/L (ref 39–117)
BUN: 18 mg/dL (ref 6–23)
CO2: 26 mEq/L (ref 19–32)
Calcium: 9.4 mg/dL (ref 8.4–10.5)
Chloride: 104 mEq/L (ref 96–112)
Creatinine, Ser: 1.25 mg/dL (ref 0.40–1.50)
GFR: 57.61 mL/min — ABNORMAL LOW (ref >60.00)
Glucose, Bld: 113 mg/dL — ABNORMAL HIGH (ref 70–99)
Potassium: 3.9 mEq/L (ref 3.5–5.1)
Sodium: 137 mEq/L (ref 135–145)
Total Bilirubin: 0.5 mg/dL (ref 0.2–1.2)
Total Protein: 7 g/dL (ref 6.0–8.3)

## 2018-04-12 LAB — BASIC METABOLIC PANEL
BUN: 18 mg/dL (ref 6–23)
CO2: 26 mEq/L (ref 19–32)
Calcium: 9.4 mg/dL (ref 8.4–10.5)
Chloride: 104 mEq/L (ref 96–112)
Creatinine, Ser: 1.25 mg/dL (ref 0.40–1.50)
GFR: 57.61 mL/min — ABNORMAL LOW (ref >60.00)
Glucose, Bld: 113 mg/dL — ABNORMAL HIGH (ref 70–99)
Potassium: 3.9 mEq/L (ref 3.5–5.1)
Sodium: 137 mEq/L (ref 135–145)

## 2018-04-12 LAB — IBC PANEL
Iron: 85 ug/dL (ref 42–165)
Saturation Ratios: 18.9 % — ABNORMAL LOW (ref 20.0–50.0)
Transferrin: 321 mg/dL (ref 212.0–360.0)

## 2018-04-16 ENCOUNTER — Other Ambulatory Visit: Payer: Self-pay | Admitting: Family Medicine

## 2018-04-17 DIAGNOSIS — H353211 Exudative age-related macular degeneration, right eye, with active choroidal neovascularization: Secondary | ICD-10-CM | POA: Diagnosis not present

## 2018-04-17 DIAGNOSIS — H353221 Exudative age-related macular degeneration, left eye, with active choroidal neovascularization: Secondary | ICD-10-CM | POA: Diagnosis not present

## 2018-04-17 DIAGNOSIS — H43813 Vitreous degeneration, bilateral: Secondary | ICD-10-CM | POA: Diagnosis not present

## 2018-04-28 ENCOUNTER — Other Ambulatory Visit: Payer: Self-pay | Admitting: Family Medicine

## 2018-04-30 NOTE — Telephone Encounter (Signed)
Sent. Thanks.   

## 2018-04-30 NOTE — Telephone Encounter (Signed)
Name of Medication: Tramadol  Name of Pharmacy: [facility] Last Fill or Written Date and Quantity: 01/24/18 #60/2 Last Office Visit and Type: 04/04/18 Next Office Visit and Type: 10/29/18 CPE Last Controlled Substance Agreement Date: none Last EZM:OQHU See allergy/contraindication

## 2018-05-04 ENCOUNTER — Ambulatory Visit (INDEPENDENT_AMBULATORY_CARE_PROVIDER_SITE_OTHER): Payer: PPO | Admitting: *Deleted

## 2018-05-04 DIAGNOSIS — I63429 Cerebral infarction due to embolism of unspecified anterior cerebral artery: Secondary | ICD-10-CM | POA: Diagnosis not present

## 2018-05-04 NOTE — Progress Notes (Signed)
Carelink Summary Report / Loop Recorder 

## 2018-05-09 LAB — CUP PACEART REMOTE DEVICE CHECK
Date Time Interrogation Session: 20190811064132
Implantable Pulse Generator Implant Date: 20170504

## 2018-05-14 DIAGNOSIS — R079 Chest pain, unspecified: Secondary | ICD-10-CM | POA: Diagnosis not present

## 2018-05-14 DIAGNOSIS — R0789 Other chest pain: Secondary | ICD-10-CM | POA: Diagnosis not present

## 2018-05-14 DIAGNOSIS — W19XXXA Unspecified fall, initial encounter: Secondary | ICD-10-CM | POA: Diagnosis not present

## 2018-05-14 DIAGNOSIS — I10 Essential (primary) hypertension: Secondary | ICD-10-CM | POA: Diagnosis not present

## 2018-05-15 ENCOUNTER — Encounter (HOSPITAL_COMMUNITY): Payer: Self-pay | Admitting: Emergency Medicine

## 2018-05-15 ENCOUNTER — Other Ambulatory Visit: Payer: Self-pay

## 2018-05-15 ENCOUNTER — Emergency Department (HOSPITAL_COMMUNITY): Payer: PPO

## 2018-05-15 ENCOUNTER — Emergency Department (HOSPITAL_COMMUNITY)
Admission: EM | Admit: 2018-05-15 | Discharge: 2018-05-15 | Disposition: A | Discharge status: Home / Self Care | Payer: PPO | Attending: Emergency Medicine | Admitting: Emergency Medicine

## 2018-05-15 DIAGNOSIS — Z7901 Long term (current) use of anticoagulants: Secondary | ICD-10-CM | POA: Diagnosis not present

## 2018-05-15 DIAGNOSIS — Z79899 Other long term (current) drug therapy: Secondary | ICD-10-CM | POA: Insufficient documentation

## 2018-05-15 DIAGNOSIS — S20211A Contusion of right front wall of thorax, initial encounter: Secondary | ICD-10-CM | POA: Diagnosis not present

## 2018-05-15 DIAGNOSIS — I10 Essential (primary) hypertension: Secondary | ICD-10-CM | POA: Diagnosis not present

## 2018-05-15 DIAGNOSIS — W19XXXA Unspecified fall, initial encounter: Secondary | ICD-10-CM | POA: Insufficient documentation

## 2018-05-15 DIAGNOSIS — I2510 Atherosclerotic heart disease of native coronary artery without angina pectoris: Secondary | ICD-10-CM | POA: Insufficient documentation

## 2018-05-15 DIAGNOSIS — S29001A Unspecified injury of muscle and tendon of front wall of thorax, initial encounter: Secondary | ICD-10-CM | POA: Diagnosis present

## 2018-05-15 DIAGNOSIS — Z87891 Personal history of nicotine dependence: Secondary | ICD-10-CM | POA: Insufficient documentation

## 2018-05-15 DIAGNOSIS — Y999 Unspecified external cause status: Secondary | ICD-10-CM | POA: Insufficient documentation

## 2018-05-15 DIAGNOSIS — S299XXA Unspecified injury of thorax, initial encounter: Secondary | ICD-10-CM | POA: Diagnosis not present

## 2018-05-15 DIAGNOSIS — W010XXA Fall on same level from slipping, tripping and stumbling without subsequent striking against object, initial encounter: Secondary | ICD-10-CM

## 2018-05-15 DIAGNOSIS — J449 Chronic obstructive pulmonary disease, unspecified: Secondary | ICD-10-CM | POA: Diagnosis not present

## 2018-05-15 DIAGNOSIS — R0781 Pleurodynia: Secondary | ICD-10-CM | POA: Diagnosis not present

## 2018-05-15 DIAGNOSIS — Y929 Unspecified place or not applicable: Secondary | ICD-10-CM | POA: Diagnosis not present

## 2018-05-15 DIAGNOSIS — Y939 Activity, unspecified: Secondary | ICD-10-CM | POA: Diagnosis not present

## 2018-05-15 MED ORDER — TRAMADOL HCL 50 MG PO TABS: 50.0000 mg | ORAL_TABLET | Freq: Four times a day (QID) | ORAL | 0 refills | Status: DC | PRN

## 2018-05-15 MED ORDER — OXYCODONE-ACETAMINOPHEN 5-325 MG PO TABS
1.0000 | ORAL_TABLET | Freq: Once | ORAL | Status: AC
  Administered 2018-05-15: 1 via ORAL
  Filled 2018-05-15: qty 1

## 2018-05-15 NOTE — ED Triage Notes (Signed)
Pt BIB [facility] from home. Fell on Friday, c/o right sided chest wall pain, worsening today. Pt currently taking blood thinners. EMS vitals: BP 160/78, HR 85, SpO2 96% room air.

## 2018-05-15 NOTE — Discharge Instructions (Addendum)
Apply ice for 30 minutes 3-4 times a day. Take acetaminophen as needed for less severe pain.

## 2018-05-15 NOTE — ED Provider Notes (Signed)
[facility] EMERGENCY DEPARTMENT Provider Note   CSN: [id number] Arrival date & time: 05/15/18  0015     History   Chief Complaint Chief Complaint  Patient presents with  . Fall    HPI [name] is a 84 y.o. male.  The history is provided by the patient.  He has history of hypertension, hyperlipidemia, peptic ulcer disease, GERD who comes in complaining of right anterior lateral chest pain since a fall 3 days ago.  He states that he tripped at home and fell landing on a wood pile.  He denies any head injury or loss of consciousness.  His wife states that his mental status has remained normal throughout the 3 days.  He states that his ribs hurt on the right with he moves or if he presses on it but it is not bothering him if he holds still.  He denies other injury.  He denies dyspnea, nausea, vomiting.  He takes clopidogrel, but no other antiplatelet agents and no anticoagulants.  He takes tramadol for chronic back pain, but tramadol is not helping this pain.  Past Medical History:  Diagnosis Date  . Back pain    improved with gabapentin as of 2015  . Barrett esophagus 09/2003  . Coronary artery calcification seen on CT scan   . Diverticulitis   . Diverticulosis   . GERD (gastroesophageal reflux disease)   . GI AVM (gastrointestinal arteriovenous vascular malformation)   . Hearing loss   . Hemorrhoids   . History of Holter monitoring    a. holter 9/17: NSR, PVCs; no sustained arrhythmia; PVC burden 8%  . History of nuclear stress test    a. Myoview 8/17: Intermediate risk, inferior ischemia, not gated  . History of prostatitis   . History of stroke   . Hyperlipidemia   . Hypertension   . Macular degeneration, wet (HCC)    receiving intra-ocular injections ([facility])  . PUD (peptic ulcer disease)     Patient Active Problem List   Diagnosis Date Noted  . SI (sacroiliac) pain 03/26/2018  . Visit for suture removal 12/31/2017  . Testicle pain 08/06/2017    . Dysuria 07/04/2017  . Coronary artery disease involving native coronary artery of native heart without angina pectoris 05/04/2017  . Prostatitis 03/28/2017  . Pulse irregularity 03/03/2017  . Cough 02/03/2017  . Abnormal ultrasound 01/11/2017  . Pain in joint, shoulder region 03/30/2016  . Lower back pain 03/30/2016  . Tennis elbow 03/30/2016  . Iron deficiency 03/07/2016  . Restless leg syndrome 03/04/2016  . Leg pain 01/28/2016  . Vitamin B 12 deficiency 01/28/2016  . Knee pain 12/06/2015  . Fatigue 12/06/2015  . 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/27/2015  . Palpitations 04/03/2015  . Mixed hyperlipidemia 04/03/2015  . Cerebral infarction due to stenosis of cerebral artery (HCC) 04/03/2015  . History of stroke   . Advance care planning 08/21/2014  . Memory change 08/21/2014  . Acute diverticulitis 08/16/2014  . Ectatic abdominal aorta (HCC) 08/16/2014  . COPD exacerbation (HCC)   . Abdominal pain   . GERD (gastroesophageal reflux disease) 05/29/2014  . Left flank pain, chronic 01/22/2013  . Hypokalemia 12/28/2012  . Angioedema of lips 12/28/2012  . Medicare annual wellness visit, subsequent 08/15/2011  . Angiodysplasia of intestine 11/30/2009  . Hearing loss 07/27/2009  . DYSPHAGIA 08/21/2008  . Barrett's esophagus 05/16/2008  . TEMPOROMANDIBULAR JOINT DISORDER 10/16/2007  . UMBILICAL HERNIA 09/22/2007  . Essential hypertension 05/15/2007  . HEMORRHOIDS 05/15/2007  Past Surgical History:  Procedure Laterality Date  . EP IMPLANTABLE DEVICE N/A 12/24/2015   Procedure: Loop Recorder Insertion;  Surgeon: Hillis Range, MD;  Location: [facility] INVASIVE CV LAB;  Service: Cardiovascular;  Laterality: N/A;  . INGUINAL HERNIA REPAIR  2001  . LAPAROSCOPIC APPENDECTOMY N/A 07/07/2014   Procedure: APPENDECTOMY LAPAROSCOPIC;  Surgeon: Manus Rudd, MD;  Location: [facility] OR;  Service: General;  Laterality: N/A;  . TEE WITHOUT CARDIOVERSION N/A 10/28/2015   Procedure: TRANSESOPHAGEAL ECHOCARDIOGRAM  (TEE);  Surgeon: Laurey Morale, MD;  Location: [facility] ENDOSCOPY;  Service: Cardiovascular;  Laterality: N/A;        Home Medications    Prior to Admission medications   Medication Sig Start Date End Date Taking? Authorizing Provider  amoxicillin-clavulanate (AUGMENTIN) 875-125 MG tablet Take 1 tablet by mouth 2 (two) times daily. 04/04/18   Joaquim Nam, MD  Bevacizumab (AVASTIN IV) Place 1 Applicatorful into both eyes See admin instructions. Right eye every 7 weeks Left eye every 7 weeks    [provider]  cetirizine (ZYRTEC) 10 MG tablet Take 10 mg by mouth daily as needed for allergies.     [provider]  clopidogrel (PLAVIX) 75 MG tablet Take 1 tablet (75 mg total) by mouth daily. 10/27/17   Joaquim Nam, MD  diltiazem [facility] CD) 180 MG 24 hr capsule TAKE ONE (1) CAPSULE BY MOUTH EACH DAY 10/27/17   Joaquim Nam, MD  ferrous sulfate 325 (65 FE) MG tablet Take 1 tablet (325 mg total) by mouth daily with breakfast. 04/05/17   Joaquim Nam, MD  fluticasone [facility]) 50 MCG/ACT nasal spray Place 2 sprays into both nostrils daily as needed for allergies. 08/21/15   Joaquim Nam, MD  gabapentin (NEURONTIN) 300 MG capsule TAKE ONE (1) CAPSULE BY MOUTH 2 TIMES DAILY 10/27/17   Joaquim Nam, MD  gemfibrozil (LOPID) 600 MG tablet Take 1 tablet (600 mg total) by mouth 2 (two) times daily. 10/27/17   Joaquim Nam, MD  guaiFENesin (ROBITUSSIN) 100 MG/5ML SOLN Take 5 mLs (100 mg total) by mouth every 6 (six) hours as needed for cough. 02/01/17   Joaquim Nam, MD  hydrochlorothiazide (HYDRODIURIL) 25 MG tablet Take 1 tablet (25 mg total) by mouth daily. 10/27/17   Joaquim Nam, MD  Hypromellose (ARTIFICIAL TEARS OP) Apply 1 drop to eye daily as needed (itchy / watery eyes).    [provider]  loperamide (IMODIUM) 2 MG capsule Take 1 capsule (2 mg total) by mouth 3 (three) times daily as needed for diarrhea or loose stools. 07/15/14   Hongalgi, Maximino Greenland, MD  meclizine (ANTIVERT) 25 MG tablet Take 0.5 tablets (12.5 mg total) by mouth 3 (three) times daily as needed for dizziness. 12/03/15   Joaquim Nam, MD  Multiple Vitamins-Minerals (OCUVITE ADULT 50+ PO) Take 1 capsule by mouth daily.      [provider]  nitroGLYCERIN (NITROSTAT) 0.4 MG SL tablet Place 1 tablet (0.4 mg total) under the tongue every 5 (five) minutes as needed for chest pain (max 3 doses in 15 minutes). 03/26/18   Karie Schwalbe, MD  pantoprazole (PROTONIX) 40 MG tablet Take 1 tablet (40 mg total) by mouth every morning. Patient taking differently: Take 40 mg by mouth daily.  10/27/17   Joaquim Nam, MD  PRALUENT 150 MG/ML SOPN INJECT 1 SYRINGE INTO THE SKIN ONCE EVERY 14 DAYS 11/29/16   Joaquim Nam, MD  ranitidine (ZANTAC) 150 MG  tablet TAKE 1 TABLET BY MOUTH TWICE A DAY 09/25/17   Joaquim Namuncan, Graham S, MD  rOPINIRole (REQUIP) 1 MG tablet Take 1 tablet (1 mg total) by mouth at bedtime. 10/27/17   Joaquim Namuncan, Graham S, MD  sucralfate (CARAFATE) 1 g tablet TAKE 1 TABLET BY MOUTH BEFORE LAYING DOWN FOR NAP ORAS NEEDED    [provider]  tamsulosin (FLOMAX) 0.4 MG CAPS capsule Take 0.4 mg by mouth daily.  10/26/15   [provider]  traMADol (ULTRAM) 50 MG tablet TAKE ONE TABLET BY MOUTH EVERY EIGHT HOURS AS NEEDED FOR MODERATE TO SEVERE PAIN 04/30/18   Joaquim Namuncan, Graham S, MD  vitamin B-12 (CYANOCOBALAMIN) 1000 MCG tablet Take 2 tablets (2,000 mcg total) by mouth daily. 01/28/16   Joaquim Namuncan, Graham S, MD    Family History Family History  Problem Relation Age of Onset  . Heart attack Father   . Coronary artery disease Father   . Hypertension Mother   . Pancreatic cancer Mother        Died at 2886.  . Cancer Mother        Pancreatic cancer  . Arthritis Sister   . Hyperlipidemia Sister   . Prostate cancer Brother   . Cancer Brother        Bone Cancer secondary to Prostate Cancer  . Bleeding Disorder Son   . Diabetes Neg Hx     Social History Social  History   Tobacco Use  . Smoking status: Former Smoker    Packs/day: 0.00    Years: 36.00    Pack years: 0.00    Types: Cigarettes  . Smokeless tobacco: Never Used  . Tobacco comment: Quit in 1983  Substance Use Topics  . Alcohol use: No    Alcohol/week: 0.0 standard drinks  . Drug use: No     Allergies   Angiotensin receptor blockers; Atorvastatin; Codeine; Lisinopril; and Statins   Review of Systems Review of Systems  All other systems reviewed and are negative.    Physical Exam Updated Vital Signs BP (!) 183/70 (BP Location: Left Arm)   Pulse 80   Temp 98.4 F (36.9 C) (Oral)   Resp 15   SpO2 97%   Physical Exam  Nursing note and vitals reviewed.  84 year old male, resting comfortably and in no acute distress. Vital signs are significant for systolic hypertension. Oxygen saturation is 97%, which is normal. Head is normocephalic and atraumatic. PERRLA, EOMI. Oropharynx is clear. Neck is nontender and supple without adenopathy or JVD. Back is nontender and there is no CVA tenderness. Lungs are clear without rales, wheezes, or rhonchi. Chest has well localized tenderness in the right anterolateral rib cage.  There is no crepitus. Heart has regular rate and rhythm without murmur. Abdomen is soft, flat, nontender without masses or hepatosplenomegaly and peristalsis is normoactive. Extremities have no cyanosis or edema, full range of motion is present. Skin is warm and dry without rash. Neurologic: Mental status is normal, cranial nerves are intact, there are no motor or sensory deficits.  ED Treatments / Results   EKG EKG Interpretation  Date/Time:  Tuesday May 15 2018 00:19:54 EDT Ventricular Rate:  81 PR Interval:  182 QRS Duration: 88 QT Interval:  374 QTC Calculation: 434 R Axis:   26 Text Interpretation:  Sinus rhythm with Premature atrial complexes Nonspecific ST abnormality Abnormal ECG When compared with ECG of 02/23/2016, No significant change  was found Confirmed by Dione BoozeGlick, Tomislav Micale ([id number]) on 05/15/2018 12:26:50 AM  Radiology Dg Ribs Unilateral W/chest Right  Result Date: 05/15/2018 CLINICAL DATA:  Fall, right rib pain EXAM: RIGHT RIBS AND CHEST - 3+ VIEW COMPARISON:  02/22/2016 chest radiograph. FINDINGS: Loop recorder overlies the medial left chest. Stable cardiomediastinal silhouette with normal heart size. No pneumothorax. No pleural effusion. No pulmonary edema. No acute consolidative airspace disease. The area of symptomatic concern as indicated by the patient in the right chest wall was denoted with a metallic skin BB by the technologist. No fracture or suspicious focal osseous lesion is seen in the right ribs. IMPRESSION: No active cardiopulmonary disease. No right rib fracture. Should the patient's symptoms persist or worsen, repeat radiographs of the ribs in 10 - 14 days maybe of use to detect subtle nondisplaced rib fractures (which are commonly occult on initial imaging). Electronically Signed   By: Delbert Phenix M.D.   On: 05/15/2018 02:21    Procedures Procedures   Medications Ordered in ED Medications  oxyCODONE-acetaminophen (PERCOCET/ROXICET) 5-325 MG per tablet 1 tablet (has no administration in time range)     Initial Impression / Assessment and Plan / ED Course  I have reviewed the triage vital signs and the nursing notes.  Pertinent labs & imaging results that were available during my care of the patient were reviewed by me and considered in my medical decision making (see chart for details).  Fall with chest injury.  He will be sent for rib x-rays.  Since it is 3 days since the fall and he has not had any neurologic symptoms, I do not feel that CT of head is necessary.  Old records are reviewed, and he does have prior ED visits for falls.  X-rays show no evidence of rib fracture.  Patient was advised that rib x-rays can be falsely negative.  He is discharged with prescription for tramadol, advised on applying  ice.  Return precautions discussed.  Final Clinical Impressions(s) / ED Diagnoses   Final diagnoses:  Fall from slip, trip, or stumble, initial encounter  Contusion of right chest wall, initial encounter    ED Discharge Orders         Ordered    traMADol (ULTRAM) 50 MG tablet  Every 6 hours PRN    Note to Pharmacy:  Not to exceed 3 additional fills before 07/23/2018   05/15/18 0338           Dione Booze, MD 05/15/18 [id number]

## 2018-05-19 LAB — CUP PACEART REMOTE DEVICE CHECK
Date Time Interrogation Session: 20190913104231
Implantable Pulse Generator Implant Date: 20170504

## 2018-05-22 ENCOUNTER — Other Ambulatory Visit: Payer: Self-pay | Admitting: Family Medicine

## 2018-05-22 DIAGNOSIS — H353211 Exudative age-related macular degeneration, right eye, with active choroidal neovascularization: Secondary | ICD-10-CM | POA: Diagnosis not present

## 2018-05-24 ENCOUNTER — Other Ambulatory Visit: Payer: Self-pay | Admitting: Internal Medicine

## 2018-05-24 DIAGNOSIS — H353221 Exudative age-related macular degeneration, left eye, with active choroidal neovascularization: Secondary | ICD-10-CM | POA: Diagnosis not present

## 2018-05-25 ENCOUNTER — Encounter: Payer: Self-pay | Admitting: Family Medicine

## 2018-05-25 ENCOUNTER — Ambulatory Visit (INDEPENDENT_AMBULATORY_CARE_PROVIDER_SITE_OTHER): Payer: PPO | Admitting: Family Medicine

## 2018-05-25 ENCOUNTER — Ambulatory Visit (INDEPENDENT_AMBULATORY_CARE_PROVIDER_SITE_OTHER)
Admission: RE | Admit: 2018-05-25 | Discharge: 2018-05-25 | Disposition: A | Discharge status: Home / Self Care | Payer: PPO | Source: Ambulatory Visit | Attending: Family Medicine | Admitting: Family Medicine

## 2018-05-25 VITALS — BP 122/60 | HR 73 | Temp 97.6°F | Ht 66.5 in | Wt 154.5 lb

## 2018-05-25 DIAGNOSIS — R0789 Other chest pain: Secondary | ICD-10-CM | POA: Diagnosis not present

## 2018-05-25 DIAGNOSIS — I509 Heart failure, unspecified: Secondary | ICD-10-CM | POA: Diagnosis not present

## 2018-05-25 MED ORDER — PANTOPRAZOLE SODIUM 40 MG PO TBEC: 40.0000 mg | DELAYED_RELEASE_TABLET | Freq: Every day | ORAL | Status: DC

## 2018-05-25 MED ORDER — ALIROCUMAB 150 MG/ML ~~LOC~~ SOPN: 150.0000 mg | PEN_INJECTOR | SUBCUTANEOUS | Status: DC

## 2018-05-25 MED ORDER — GABAPENTIN 300 MG PO CAPS: 300.0000 mg | ORAL_CAPSULE | Freq: Two times a day (BID) | ORAL | Status: DC

## 2018-05-25 MED ORDER — DILTIAZEM HCL ER COATED BEADS 180 MG PO CP24: 180.0000 mg | ORAL_CAPSULE | Freq: Every day | ORAL | Status: DC

## 2018-05-25 NOTE — Progress Notes (Signed)
ER f/u after a fall.  ER course and results discussed with patient.  Fall cautions discussed with patient.  No falls since then.  No LOC with the event.  No pain now but still with occ "spells" of R sided chest pain, anterior and posterior.  Pain is better today than yesterday.  Overall he is slowly getting better.  He is using ice.  Taking tramadol for pain.   Not lightheaded.  It was a mechanical fall, "when I got tangled up in everything."    D/w pt about f/u imaging.  See notes on imaging.  Not short of breath.  No left-sided chest pain.  The right sided chest pain that he does have is upon palpation and he is focally tender, per his report.  Meds, vitals, and allergies reviewed.   ROS: Per HPI unless specifically indicated in ROS section   GEN: nad, alert and oriented HEENT: mucous membranes moist NECK: supple w/o LA CV: rrr.  Chest wall tender to palpation on the right side of the anterior chest without local bruising or erythema.  He is focally tender on the right mid chest wall PULM: ctab, no inc wob ABD: soft, +bs EXT: no edema SKIN: Well-perfused

## 2018-05-25 NOTE — Patient Instructions (Signed)
Take tramadol for pain.  Be careful around the house.  Go to the lab on the way out.  We'll contact you with your xray report. Take care.  Glad to see you.

## 2018-05-27 DIAGNOSIS — R0789 Other chest pain: Secondary | ICD-10-CM | POA: Insufficient documentation

## 2018-05-27 NOTE — Assessment & Plan Note (Addendum)
Fall cautions discussed with patient.  Likely chest wall pain.  No ominous findings.  Reasonable to recheck chest x-ray today.  See notes on imaging.  No fracture seen by my read at office visit.  Reasonable to use tramadol as needed with routine cautions.  He agrees.

## 2018-06-01 ENCOUNTER — Ambulatory Visit (INDEPENDENT_AMBULATORY_CARE_PROVIDER_SITE_OTHER): Payer: PPO | Admitting: Family Medicine

## 2018-06-01 ENCOUNTER — Encounter: Payer: Self-pay | Admitting: Family Medicine

## 2018-06-01 VITALS — BP 116/60 | HR 77 | Temp 97.7°F | Ht 66.5 in | Wt 150.8 lb

## 2018-06-01 DIAGNOSIS — R0789 Other chest pain: Secondary | ICD-10-CM

## 2018-06-01 DIAGNOSIS — Z23 Encounter for immunization: Secondary | ICD-10-CM

## 2018-06-01 LAB — CBC WITH DIFFERENTIAL/PLATELET
Basophils Absolute: 0 10*3/uL (ref 0.0–0.1)
Basophils Relative: 0.5 % (ref 0.0–3.0)
Eosinophils Absolute: 0.2 10*3/uL (ref 0.0–0.7)
Eosinophils Relative: 3.3 % (ref 0.0–5.0)
HCT: 41.7 % (ref 39.0–52.0)
Hemoglobin: 13.7 g/dL (ref 13.0–17.0)
Lymphocytes Relative: 25 % (ref 12.0–46.0)
Lymphs Abs: 1.5 10*3/uL (ref 0.7–4.0)
MCHC: 32.9 g/dL (ref 30.0–36.0)
MCV: 87.9 fl (ref 78.0–100.0)
Monocytes Absolute: 0.6 10*3/uL (ref 0.1–1.0)
Monocytes Relative: 10 % (ref 3.0–12.0)
Neutro Abs: 3.6 10*3/uL (ref 1.4–7.7)
Neutrophils Relative %: 61.2 % (ref 43.0–77.0)
Platelets: 297 10*3/uL (ref 150.0–400.0)
RBC: 4.74 Mil/uL (ref 4.22–5.81)
RDW: 13.9 % (ref 11.5–15.5)
WBC: 6 10*3/uL (ref 4.0–10.5)

## 2018-06-01 LAB — COMPREHENSIVE METABOLIC PANEL
ALT: 9 U/L (ref 0–53)
AST: 17 U/L (ref 0–37)
Albumin: 3.9 g/dL (ref 3.5–5.2)
Alkaline Phosphatase: 90 U/L (ref 39–117)
BUN: 17 mg/dL (ref 6–23)
CO2: 29 mEq/L (ref 19–32)
Calcium: 9 mg/dL (ref 8.4–10.5)
Chloride: 103 mEq/L (ref 96–112)
Creatinine, Ser: 1.15 mg/dL (ref 0.40–1.50)
GFR: 63.41 mL/min (ref >60.00)
Glucose, Bld: 93 mg/dL (ref 70–99)
Potassium: 3.5 mEq/L (ref 3.5–5.1)
Sodium: 139 mEq/L (ref 135–145)
Total Bilirubin: 0.4 mg/dL (ref 0.2–1.2)
Total Protein: 7 g/dL (ref 6.0–8.3)

## 2018-06-01 LAB — TROPONIN I: TNIDX: 0.01 ug/l (ref 0.00–0.06)

## 2018-06-01 MED ORDER — TRAMADOL HCL 50 MG PO TABS: 50.0000 mg | ORAL_TABLET | Freq: Three times a day (TID) | ORAL | 1 refills | Status: DC | PRN

## 2018-06-01 NOTE — Progress Notes (Signed)
Chest wall pain is clearly better in the meantime.    He was taking tramadol at baseline for his back o/w, used as prn.  No adverse effect on medication.  He had a different pain in his chest.  It was yesterday or the day before after eating ham biscuits.  No vomiting.  Not SOB.  No BLE edema.  The pain got worse when he walked around.  No pain now.    He put out 18 bales of straw earlier in the week but didn't have chest pain with that.  His back was sore after that.  He wasn't SOB with that.    Prev CXR with cardiomegaly with bilateral interstitial prominence consistent mild CHF. Pneumonitis cannot be excluded.  No pain now.  Not SOB  PMH and SH reviewed  ROS: Per HPI unless specifically indicated in ROS section   Meds, vitals, and allergies reviewed.   nad ncat Mmm Neck supple, no LA rrr ctab abd soft, not ttp No BLE edema.  He has had a chronic bruise on the L dorsal wrist.    EKG discussed with patient.  He does have a PVC noted twice on EKG but otherwise no acute changes.  Discussed with patient at office visit.

## 2018-06-01 NOTE — Patient Instructions (Addendum)
Go to the lab on the way out.  We'll contact you with your lab report. I'll check on cardiac follow up.  If you have severe chest pain then go to the ER.  Take care.  Glad to see you.  Use the tramadol as needed.

## 2018-06-03 NOTE — Assessment & Plan Note (Signed)
This appears to be a separate issue from his previous chest wall pain and his previous back pain.  His previous chest wall pain is resolved and he has used tramadol intermittently for his lower back pain.  This is more likely to be GERD related (it started after eating ham biscuits) but I still want the patient to have follow-up with the cardiology clinic.  Diet cautions discussed.  Check routine labs today, including troponin.  EKG with PVC noted x2 but no acute changes otherwise.  Routine cautions given for patient.  See after visit summary.  Discussed.  He agrees. >25 minutes spent in face to face time with patient, >50% spent in counselling or coordination of care.

## 2018-06-04 ENCOUNTER — Telehealth: Payer: Self-pay | Admitting: Physician Assistant

## 2018-06-04 NOTE — Telephone Encounter (Signed)
Please arrange an appointment with me in the next 1-2 weeks. Tereso Newcomer, PA-C    06/04/2018 2:27 PM

## 2018-06-04 NOTE — Telephone Encounter (Signed)
-----   Message from Joaquim Nam, MD sent at 06/03/2018  3:44 PM EDT ----- Please get this patient scheduled for follow-up.  He had some atypical chest pain that I think is GERD related but I would prefer if you could see him in the clinic.  I greatly appreciate your help, as always.  Hope you are having a good day.  Thanks.  Clelia Croft

## 2018-06-04 NOTE — Telephone Encounter (Signed)
I called pt and s/w his wife (DPR) and advised per Tereso Newcomer, PA schedule an appt to be seen in the nxt 1-2 weeks. Denyce Robert W. PA recv'd message from PCP as to pt symptoms of atypical cp, possible GERD, though PCP would like pt to be seen by our office. Pt has been scheduled to see Tereso Newcomer, PA 06/05/18 @ 11:15 AM. Pt's wife thanked me for the call.

## 2018-06-05 ENCOUNTER — Ambulatory Visit: Payer: PPO | Admitting: Physician Assistant

## 2018-06-05 ENCOUNTER — Encounter: Payer: Self-pay | Admitting: Physician Assistant

## 2018-06-05 VITALS — BP 132/62 | HR 88 | Ht 66.5 in | Wt 154.4 lb

## 2018-06-05 DIAGNOSIS — I2510 Atherosclerotic heart disease of native coronary artery without angina pectoris: Secondary | ICD-10-CM

## 2018-06-05 DIAGNOSIS — Z8673 Personal history of transient ischemic attack (TIA), and cerebral infarction without residual deficits: Secondary | ICD-10-CM | POA: Diagnosis not present

## 2018-06-05 NOTE — Progress Notes (Signed)
Cardiology Office Note:    Date:  06/05/2018   ID:  [name], DOB 10-Mar-1926, MRN [id number]  PCP:  Joaquim Nam, MD  Cardiologist:  Lance Muss, MD   Electrophysiologist:  None   Referring MD: Joaquim Nam, MD   Chief Complaint  Patient presents with  . Chest Pain     History of Present Illness:    [name] is a 84 y.o. male with prior cryptogenic stroke in 6/16 and splenic infarct status post ILR, HTN, HL, COPD, von Willebrand's disease.  HisILR to date has not demonstratedatrial fibrillation. He has known atherosclerosis on a prior chest CT. In 2017, he c/o one episode chest painand aNuclear stress testdemonstrated inferior ischemia. Med Rx was recommended unless he had recurrent symptoms.   He was last seen in March 2019.  Since that time, he has gone to the emergency room twice with a fall.  He followed up with primary care after his most recent fall in September.  Repeat chest x-ray to rule out rib fracture demonstrated heart failure versus pneumonitis.  He was seen again last week with chest discomfort different from his chest wall pain.  [name] returns for evaluation of chest pain.  He is here with his wife.  He describes an episode of chest pain that occurred shortly after eating to ham biscuits.  He has had issues with indigestion after eating ham biscuits in the past.  He was able to go outside and work in his garden without difficulty.  He denies any associated shortness of breath, jaw pain or arm pain, nausea or diaphoresis.  He did not take nitroglycerin.  He has not had recurrent symptoms.  He remains quite active for a 84 year old.  He uses his backhoe to garden.  He laid out several bales of hay last week without difficulty.  Prior CV studies:   The following studies were reviewed today:  CXR 05/25/18 IMPRESSION: Cardiomegaly with bilateral interstitial prominence consistent mild CHF. Pneumonitis cannot be excluded.  Chest CT  04/06/16 IMPRESSION: 1. The nodule questioned on left shoulder films represents a small calcified granuloma within the superior segment of the left lower lobe. There also are small calcified splenic granulomas consistent with prior granulomatous disease. 2. Moderate thoracic aortic atherosclerosis. 3. Calcification of the coronary arteries diffusely. 4. Paraseptal emphysema.  Myoview 03/25/16 Medium size, moderate intensity (SDS 5) reversible inferior perfusion defect which could suggest ischemia. Study was not gated due to frequent ventricular ectopy. This is an intermediate risk study.  TEE 10/28/15 EF 55%, normal wall motion, grade 4 plaque descending thoracic aorta, trivial MR, normal RVSF, no right-to-left atrial shunt  Event monitor 8/16 Sinus rhythm Rare pacs and pvcs No sustained arrhythmias No afib  Echo 6/16 Mild LVH, EF 85%, normal wall motion, mildly reduced RVSF, PASP 37 mmHg  Past Medical History:  Diagnosis Date  . Back pain    improved with gabapentin as of 2015  . Barrett esophagus 09/2003  . Coronary artery calcification seen on CT scan   . Diverticulitis   . Diverticulosis   . GERD (gastroesophageal reflux disease)   . GI AVM (gastrointestinal arteriovenous vascular malformation)   . Hearing loss   . Hemorrhoids   . History of Holter monitoring    a. holter 9/17: NSR, PVCs; no sustained arrhythmia; PVC burden 8%  . History of nuclear stress test    a. Myoview 8/17: Intermediate risk, inferior ischemia, not gated  . History of prostatitis   .  History of stroke   . Hyperlipidemia   . Hypertension   . Macular degeneration, wet (HCC)    receiving intra-ocular injections ([facility])  . PUD (peptic ulcer disease)    Surgical Hx: The patient  has a past surgical history that includes Inguinal hernia repair (2001); laparoscopic appendectomy (N/A, 07/07/2014); TEE without cardioversion (N/A, 10/28/2015); and Cardiac catheterization (N/A, 12/24/2015).   Current  Medications: No outpatient medications have been marked as taking for the 06/05/18 encounter (Office Visit) with Tereso Newcomer T, PA-C.     Allergies:   Angiotensin receptor blockers; Atorvastatin; Codeine; Lisinopril; and Statins   Social History   Tobacco Use  . Smoking status: Former Smoker    Packs/day: 0.00    Years: 36.00    Pack years: 0.00    Types: Cigarettes  . Smokeless tobacco: Never Used  . Tobacco comment: Quit in 1983  Substance Use Topics  . Alcohol use: No    Alcohol/week: 0.0 standard drinks  . Drug use: No     Family Hx: The patient's family history includes Arthritis in his sister; Bleeding Disorder in his son; Cancer in his brother and mother; Coronary artery disease in his father; Heart attack in his father; Hyperlipidemia in his sister; Hypertension in his mother; Pancreatic cancer in his mother; Prostate cancer in his brother. There is no history of Diabetes.  ROS:   Please see the history of present illness.    Review of Systems  Constitution: Positive for weight loss.  Cardiovascular: Positive for chest pain.  Hematologic/Lymphatic: Bruises/bleeds easily.  Musculoskeletal: Positive for back pain.  Gastrointestinal: Positive for abdominal pain and diarrhea.   All other systems reviewed and are negative.   EKGs/Labs/Other Test Reviewed:    EKG:  EKG is  ordered today.  The ekg ordered today demonstrates normal sinus rhythm, heart rate 65, normal axis, nonspecific ST-T wave changes, QTC 428, similar to old EKGs  Recent Labs: 06/01/2018: ALT 9; BUN 17; Creatinine, Ser 1.15; Hemoglobin 13.7; Platelets 297.0; Potassium 3.5; Sodium 139   Recent Lipid Panel Lab Results  Component Value Date/Time   CHOL 136 10/10/2017 01:17 PM   TRIG 115.0 10/10/2017 01:17 PM   HDL 48.50 10/10/2017 01:17 PM   CHOLHDL 3 10/10/2017 01:17 PM   LDLCALC 65 10/10/2017 01:17 PM   LDLDIRECT 167.2 08/12/2013 10:24 AM    Physical Exam:    VS:  BP 132/62   Pulse 88   Ht  5' 6.5" (1.689 m)   Wt 154 lb 6.4 oz (70 kg)   SpO2 93%   BMI 24.55 kg/m     Wt Readings from Last 3 Encounters:  06/05/18 154 lb 6.4 oz (70 kg)  06/01/18 150 lb 12 oz (68.4 kg)  05/25/18 154 lb 8 oz (70.1 kg)     Physical Exam  Constitutional: He is oriented to person, place, and time. He appears well-developed and well-nourished. No distress.  HENT:  Head: Normocephalic and atraumatic.  Eyes: No scleral icterus.  Neck: No JVD present.  Cardiovascular: Normal rate and regular rhythm.  No murmur heard. Pulmonary/Chest: Effort normal. He has no rales.  Abdominal: Soft.  Musculoskeletal: He exhibits no edema.  Neurological: He is alert and oriented to person, place, and time.  Skin: Skin is warm and dry.    ASSESSMENT & PLAN:    Coronary artery disease involving native coronary artery of native heart without angina pectoris He has a history of coronary calcification on prior CT scan.  Nuclear stress test in  August 2017 demonstrated inferior ischemia.  He has been continued on medical therapy due to lack of symptoms.  He presents with a recent episode of chest discomfort.  This is quite atypical for ischemia.  It occurred shortly after eating a greasy meal.  He did not have any associated symptoms.  He remains a quite active 84 year old.  He does not have any exertional chest discomfort or shortness of breath.  His ECG is unchanged.  At this point, I do not think he needs repeat stress testing.  I have recommended that we continue medical therapy.  I have encouraged him to use nitroglycerin if he has recurrent chest pain that is concerning.  If he has any further chest discomfort, I would pursue stress testing at that point.    History of stroke No atrial fibrillation has been noted on ILR.    Dispo:  Return in about 5 months (around 11/04/2018) for Routine Follow Up, w/ Tereso Newcomer, PA-C.   Medication Adjustments/Labs and Tests Ordered: Current medicines are reviewed at length  with the patient today.  Concerns regarding medicines are outlined above.  Tests Ordered: Orders Placed This Encounter  Procedures  . EKG 12-Lead   Medication Changes: No orders of the defined types were placed in this encounter.   Signed, Tereso Newcomer, PA-C  06/05/2018 12:53 PM    [facility] [address] [place], [place], [place]  [address] Phone: [phone]; Fax: [phone]

## 2018-06-05 NOTE — Patient Instructions (Addendum)
Medication Instructions:  No changes  If you need a refill on your cardiac medications before your next appointment, please call your pharmacy.   Lab work: None   If you have labs (blood work) drawn today and your tests are completely normal, you will receive your results only by: [name] Kitchen MyChart Message (if you have MyChart) OR . A paper copy in the mail If you have any lab test that is abnormal or we need to change your treatment, we will call you to review the results.  Testing/Procedures: None   Follow-Up: At [facility], you and your health needs are our priority.  As part of our continuing mission to provide you with exceptional heart care, we have created designated Provider Care Teams.  These Care Teams include your primary Cardiologist (physician) and Advanced Practice Providers (APPs -  Physician Assistants and Nurse Practitioners) who all work together to provide you with the care you need, when you need it. [name] Kitchen Damion Kant, [facility] 10/30/18 @ 11:15 AM   Any Other Special Instructions Will Be Listed Below (If Applicable).  If you have any more chest pain, call our office. I will have you do a stress test if the chest pain recurs.

## 2018-06-06 ENCOUNTER — Ambulatory Visit (INDEPENDENT_AMBULATORY_CARE_PROVIDER_SITE_OTHER): Payer: PPO | Admitting: *Deleted

## 2018-06-06 DIAGNOSIS — I63429 Cerebral infarction due to embolism of unspecified anterior cerebral artery: Secondary | ICD-10-CM

## 2018-06-06 NOTE — Progress Notes (Signed)
Carelink Summary Report / Loop Recorder 

## 2018-06-18 LAB — CUP PACEART REMOTE DEVICE CHECK
Date Time Interrogation Session: 20191016103826
Implantable Pulse Generator Implant Date: 20170504

## 2018-06-19 DIAGNOSIS — H353221 Exudative age-related macular degeneration, left eye, with active choroidal neovascularization: Secondary | ICD-10-CM | POA: Diagnosis not present

## 2018-06-19 DIAGNOSIS — H353211 Exudative age-related macular degeneration, right eye, with active choroidal neovascularization: Secondary | ICD-10-CM | POA: Diagnosis not present

## 2018-07-01 ENCOUNTER — Encounter: Payer: Self-pay | Admitting: Family Medicine

## 2018-07-06 ENCOUNTER — Other Ambulatory Visit: Payer: Self-pay | Admitting: Internal Medicine

## 2018-07-09 ENCOUNTER — Ambulatory Visit (INDEPENDENT_AMBULATORY_CARE_PROVIDER_SITE_OTHER): Payer: PPO

## 2018-07-09 DIAGNOSIS — I63429 Cerebral infarction due to embolism of unspecified anterior cerebral artery: Secondary | ICD-10-CM

## 2018-07-09 NOTE — Progress Notes (Signed)
Carelink Summary Report / Loop Recorder 

## 2018-07-12 DIAGNOSIS — H353211 Exudative age-related macular degeneration, right eye, with active choroidal neovascularization: Secondary | ICD-10-CM | POA: Diagnosis not present

## 2018-07-12 DIAGNOSIS — H353221 Exudative age-related macular degeneration, left eye, with active choroidal neovascularization: Secondary | ICD-10-CM | POA: Diagnosis not present

## 2018-07-24 DIAGNOSIS — H353211 Exudative age-related macular degeneration, right eye, with active choroidal neovascularization: Secondary | ICD-10-CM | POA: Diagnosis not present

## 2018-07-24 DIAGNOSIS — H353221 Exudative age-related macular degeneration, left eye, with active choroidal neovascularization: Secondary | ICD-10-CM | POA: Diagnosis not present

## 2018-07-30 ENCOUNTER — Telehealth: Payer: Self-pay | Admitting: *Deleted

## 2018-07-30 NOTE — Telephone Encounter (Signed)
Patient's wife Kara Mead(Emma) phoned in saying that the insurance company is requesting a Prior Authorization for the Praluent.  Patient receives this medication through Patient Assistance, therefore not sure why a PA is requested but PA was submitted thru [employer], awaiting response.

## 2018-08-01 NOTE — Telephone Encounter (Signed)
Rocky LinkKen  Pharmacist with [employer] left v/m requesting cb with questions about PA for Praluent. Rocky Link[employer] is also faxing a copy of info needed. Ref # [id number].

## 2018-08-02 NOTE — Telephone Encounter (Signed)
Faxed form placed in Dr. Lianne Bushyuncan's In Box.

## 2018-08-02 NOTE — Telephone Encounter (Signed)
I'll work on the hard copy.  Thanks.  

## 2018-08-05 ENCOUNTER — Other Ambulatory Visit: Payer: Self-pay | Admitting: Family Medicine

## 2018-08-05 MED ORDER — ALIROCUMAB 150 MG/ML ~~LOC~~ SOAJ: 150.0000 mg | SUBCUTANEOUS | Status: DC

## 2018-08-09 DIAGNOSIS — H353231 Exudative age-related macular degeneration, bilateral, with active choroidal neovascularization: Secondary | ICD-10-CM | POA: Diagnosis not present

## 2018-08-09 DIAGNOSIS — H35423 Microcystoid degeneration of retina, bilateral: Secondary | ICD-10-CM | POA: Diagnosis not present

## 2018-08-09 DIAGNOSIS — H43813 Vitreous degeneration, bilateral: Secondary | ICD-10-CM | POA: Diagnosis not present

## 2018-08-09 NOTE — Telephone Encounter (Signed)
Approved from insurance thru February.

## 2018-08-13 ENCOUNTER — Telehealth: Payer: Self-pay

## 2018-08-13 ENCOUNTER — Ambulatory Visit (INDEPENDENT_AMBULATORY_CARE_PROVIDER_SITE_OTHER): Payer: PPO

## 2018-08-13 DIAGNOSIS — I63429 Cerebral infarction due to embolism of unspecified anterior cerebral artery: Secondary | ICD-10-CM

## 2018-08-13 LAB — CUP PACEART REMOTE DEVICE CHECK
Date Time Interrogation Session: 20191223173123
Implantable Pulse Generator Implant Date: 20170504

## 2018-08-13 NOTE — Telephone Encounter (Signed)
Manual transmission received. "AF" episodes appear SR w/PACs. ECGs printed for review by MD.

## 2018-08-13 NOTE — Telephone Encounter (Signed)
Called and spoke with patients wife [name], to request a manual LINQ transmission. Assisted patient and his wife with sending the transmission. Patient and wife are both aware that we will call back if any actionable episodes are present.

## 2018-08-13 NOTE — Progress Notes (Signed)
Carelink Summary Report / Loop Recorder 

## 2018-08-14 NOTE — Telephone Encounter (Signed)
Dr. Graciela HusbandsKlein reviewed AF episodes--ECGs mostly indicate SR w/PACs. Episode #41 shows no clear P-waves, but mean HR suggests SR w/PACs. Continue to monitor.

## 2018-08-16 ENCOUNTER — Other Ambulatory Visit: Payer: Self-pay | Admitting: Internal Medicine

## 2018-08-23 ENCOUNTER — Other Ambulatory Visit: Payer: Self-pay | Admitting: Internal Medicine

## 2018-08-26 LAB — CUP PACEART REMOTE DEVICE CHECK
Date Time Interrogation Session: 20191118124205
Implantable Pulse Generator Implant Date: 20170504

## 2018-08-29 ENCOUNTER — Telehealth: Payer: Self-pay | Admitting: Family Medicine

## 2018-08-29 NOTE — Telephone Encounter (Signed)
Late entry.  Patient was seen yesterday as an aside, at his wife scheduled visit.  He has an itchy rash on his lower back.  I looked at his back and he has a nondermatomal maculopapular mildly erythematous blanching rash on the bilateral lower back.  We talked about options.  He looks like a nonspecific dermatitis and it may be reasonable to try over-the-counter hydrocortisone first to see if that helps with itching.  He can update me as needed.  He agrees with plan.

## 2018-09-06 DIAGNOSIS — H43813 Vitreous degeneration, bilateral: Secondary | ICD-10-CM | POA: Diagnosis not present

## 2018-09-06 DIAGNOSIS — H35423 Microcystoid degeneration of retina, bilateral: Secondary | ICD-10-CM | POA: Diagnosis not present

## 2018-09-06 DIAGNOSIS — H353231 Exudative age-related macular degeneration, bilateral, with active choroidal neovascularization: Secondary | ICD-10-CM | POA: Diagnosis not present

## 2018-09-13 ENCOUNTER — Ambulatory Visit (INDEPENDENT_AMBULATORY_CARE_PROVIDER_SITE_OTHER): Payer: PPO

## 2018-09-13 DIAGNOSIS — I63429 Cerebral infarction due to embolism of unspecified anterior cerebral artery: Secondary | ICD-10-CM

## 2018-09-14 NOTE — Progress Notes (Signed)
Carelink Summary Report / Loop Recorder 

## 2018-09-16 LAB — CUP PACEART REMOTE DEVICE CHECK
Date Time Interrogation Session: 20200123144039
Implantable Pulse Generator Implant Date: 20170504

## 2018-09-26 DIAGNOSIS — Z961 Presence of intraocular lens: Secondary | ICD-10-CM | POA: Diagnosis not present

## 2018-09-26 DIAGNOSIS — H524 Presbyopia: Secondary | ICD-10-CM | POA: Diagnosis not present

## 2018-09-26 DIAGNOSIS — H353231 Exudative age-related macular degeneration, bilateral, with active choroidal neovascularization: Secondary | ICD-10-CM | POA: Diagnosis not present

## 2018-10-04 DIAGNOSIS — H353231 Exudative age-related macular degeneration, bilateral, with active choroidal neovascularization: Secondary | ICD-10-CM | POA: Diagnosis not present

## 2018-10-04 DIAGNOSIS — H353211 Exudative age-related macular degeneration, right eye, with active choroidal neovascularization: Secondary | ICD-10-CM | POA: Diagnosis not present

## 2018-10-09 ENCOUNTER — Telehealth: Payer: Self-pay

## 2018-10-09 ENCOUNTER — Other Ambulatory Visit: Payer: Self-pay | Admitting: Family Medicine

## 2018-10-09 NOTE — Telephone Encounter (Signed)
Mrs Street request cb about pt assistance for cholesterol med.

## 2018-10-09 NOTE — Telephone Encounter (Signed)
Electronic refill request. Tramadol Last office visit:   06/01/18 Acute Last Filled:    60 tablet 1 06/01/2018  Please advise.

## 2018-10-09 NOTE — Telephone Encounter (Signed)
I spoke with [name] at [employer] who says the patient's wife called yesterday and she was told that they have experienced a larger than usual amount of applications and everything is in the system, they are only waiting on approvals at which time the patient and our office will be notified by letter.  I advised the patient's wife.

## 2018-10-10 ENCOUNTER — Encounter: Payer: Self-pay | Admitting: Physician Assistant

## 2018-10-10 NOTE — Telephone Encounter (Signed)
Sent. Thanks.   

## 2018-10-11 ENCOUNTER — Ambulatory Visit: Payer: PPO

## 2018-10-16 ENCOUNTER — Ambulatory Visit (INDEPENDENT_AMBULATORY_CARE_PROVIDER_SITE_OTHER): Payer: PPO | Admitting: *Deleted

## 2018-10-16 DIAGNOSIS — I63429 Cerebral infarction due to embolism of unspecified anterior cerebral artery: Secondary | ICD-10-CM

## 2018-10-16 LAB — CUP PACEART REMOTE DEVICE CHECK
Date Time Interrogation Session: 20200225150821
Implantable Pulse Generator Implant Date: 20170504

## 2018-10-17 ENCOUNTER — Other Ambulatory Visit: Payer: Self-pay | Admitting: Family Medicine

## 2018-10-17 DIAGNOSIS — G2581 Restless legs syndrome: Secondary | ICD-10-CM

## 2018-10-17 NOTE — Telephone Encounter (Signed)
Sent.  He has f/u pending.  Thanks.  Should be okay to continue as loperamide was an old medication order.

## 2018-10-17 NOTE — Telephone Encounter (Signed)
Electronic refill request Last office visit 06/01/18 Requip last refill 10/27/17 #90/3 Plavix last refill 10/27/17 #90/3 Lopid last refill 10/27/17 #180/3 See drug warning with Lopid and Loperamide

## 2018-10-18 ENCOUNTER — Telehealth: Payer: Self-pay | Admitting: Family Medicine

## 2018-10-18 NOTE — Telephone Encounter (Signed)
Patient's wife called to get a refill on patient's Praluent.  Patient has been getting the medication for free, but they haven't heard from the company this year and patient's out of his medication.  Please call in rx to [facility].

## 2018-10-19 ENCOUNTER — Encounter: Payer: PPO | Admitting: Family Medicine

## 2018-10-19 NOTE — Telephone Encounter (Signed)
Last lipid panel 10-10-17

## 2018-10-21 MED ORDER — ALIROCUMAB 150 MG/ML ~~LOC~~ SOAJ: 150.0000 mg | SUBCUTANEOUS | 3 refills | Status: DC

## 2018-10-21 NOTE — Telephone Encounter (Signed)
rx sent.  Needs yearly f/u with labs ahead of time.  Thanks.

## 2018-10-22 ENCOUNTER — Ambulatory Visit: Payer: PPO

## 2018-10-22 ENCOUNTER — Other Ambulatory Visit (INDEPENDENT_AMBULATORY_CARE_PROVIDER_SITE_OTHER): Payer: PPO

## 2018-10-22 ENCOUNTER — Encounter: Payer: Self-pay | Admitting: *Deleted

## 2018-10-22 ENCOUNTER — Other Ambulatory Visit: Payer: Self-pay | Admitting: Family Medicine

## 2018-10-22 DIAGNOSIS — I10 Essential (primary) hypertension: Secondary | ICD-10-CM

## 2018-10-22 DIAGNOSIS — E538 Deficiency of other specified B group vitamins: Secondary | ICD-10-CM | POA: Diagnosis not present

## 2018-10-22 DIAGNOSIS — E611 Iron deficiency: Secondary | ICD-10-CM

## 2018-10-22 DIAGNOSIS — H9190 Unspecified hearing loss, unspecified ear: Secondary | ICD-10-CM

## 2018-10-22 DIAGNOSIS — E782 Mixed hyperlipidemia: Secondary | ICD-10-CM

## 2018-10-22 LAB — COMPREHENSIVE METABOLIC PANEL
ALT: 9 U/L (ref 0–53)
AST: 17 U/L (ref 0–37)
Albumin: 4.2 g/dL (ref 3.5–5.2)
Alkaline Phosphatase: 90 U/L (ref 39–117)
BUN: 20 mg/dL (ref 6–23)
CO2: 28 mEq/L (ref 19–32)
Calcium: 9.1 mg/dL (ref 8.4–10.5)
Chloride: 102 mEq/L (ref 96–112)
Creatinine, Ser: 1.33 mg/dL (ref 0.40–1.50)
GFR: 50.4 mL/min — ABNORMAL LOW (ref >60.00)
Glucose, Bld: 89 mg/dL (ref 70–99)
Potassium: 3.5 mEq/L (ref 3.5–5.1)
Sodium: 139 mEq/L (ref 135–145)
Total Bilirubin: 0.3 mg/dL (ref 0.2–1.2)
Total Protein: 7.3 g/dL (ref 6.0–8.3)

## 2018-10-22 LAB — CBC WITH DIFFERENTIAL/PLATELET
Basophils Absolute: 0.1 10*3/uL (ref 0.0–0.1)
Basophils Relative: 0.8 % (ref 0.0–3.0)
Eosinophils Absolute: 0.3 10*3/uL (ref 0.0–0.7)
Eosinophils Relative: 3.4 % (ref 0.0–5.0)
HCT: 42.7 % (ref 39.0–52.0)
Hemoglobin: 14.3 g/dL (ref 13.0–17.0)
Lymphocytes Relative: 20.8 % (ref 12.0–46.0)
Lymphs Abs: 1.7 10*3/uL (ref 0.7–4.0)
MCHC: 33.6 g/dL (ref 30.0–36.0)
MCV: 89.8 fl (ref 78.0–100.0)
Monocytes Absolute: 0.9 10*3/uL (ref 0.1–1.0)
Monocytes Relative: 10.7 % (ref 3.0–12.0)
Neutro Abs: 5.1 10*3/uL (ref 1.4–7.7)
Neutrophils Relative %: 64.3 % (ref 43.0–77.0)
Platelets: 306 10*3/uL (ref 150.0–400.0)
RBC: 4.76 Mil/uL (ref 4.22–5.81)
RDW: 13.6 % (ref 11.5–15.5)
WBC: 8 10*3/uL (ref 4.0–10.5)

## 2018-10-22 LAB — LIPID PANEL
Cholesterol: 122 mg/dL (ref 0–200)
HDL: 48 mg/dL (ref >39.00)
LDL Cholesterol: 54 mg/dL (ref 0–99)
NonHDL: 73.79
Total CHOL/HDL Ratio: 3
Triglycerides: 97 mg/dL (ref 0.0–149.0)
VLDL: 19.4 mg/dL (ref 0.0–40.0)

## 2018-10-22 LAB — IRON: Iron: 59 ug/dL (ref 42–165)

## 2018-10-22 LAB — VITAMIN B12: Vitamin B-12: 762 pg/mL (ref 211–911)

## 2018-10-22 NOTE — Telephone Encounter (Signed)
Letter mailed

## 2018-10-24 NOTE — Progress Notes (Signed)
Carelink Summary Report / Loop Recorder 

## 2018-10-29 ENCOUNTER — Ambulatory Visit (INDEPENDENT_AMBULATORY_CARE_PROVIDER_SITE_OTHER): Payer: PPO | Admitting: Family Medicine

## 2018-10-29 ENCOUNTER — Encounter: Payer: Self-pay | Admitting: Family Medicine

## 2018-10-29 VITALS — BP 140/62 | HR 89 | Temp 97.4°F | Ht 66.5 in | Wt 161.3 lb

## 2018-10-29 DIAGNOSIS — K2270 Barrett's esophagus without dysplasia: Secondary | ICD-10-CM | POA: Diagnosis not present

## 2018-10-29 DIAGNOSIS — I10 Essential (primary) hypertension: Secondary | ICD-10-CM

## 2018-10-29 DIAGNOSIS — Z7189 Other specified counseling: Secondary | ICD-10-CM

## 2018-10-29 DIAGNOSIS — G2581 Restless legs syndrome: Secondary | ICD-10-CM | POA: Diagnosis not present

## 2018-10-29 DIAGNOSIS — E782 Mixed hyperlipidemia: Secondary | ICD-10-CM

## 2018-10-29 DIAGNOSIS — Z0000 Encounter for general adult medical examination without abnormal findings: Secondary | ICD-10-CM | POA: Diagnosis not present

## 2018-10-29 DIAGNOSIS — M545 Low back pain, unspecified: Secondary | ICD-10-CM

## 2018-10-29 MED ORDER — HYDROCHLOROTHIAZIDE 25 MG PO TABS: 25.0000 mg | ORAL_TABLET | Freq: Every day | ORAL | 3 refills | Status: DC

## 2018-10-29 MED ORDER — PANTOPRAZOLE SODIUM 40 MG PO TBEC: DELAYED_RELEASE_TABLET | ORAL | 3 refills | Status: DC

## 2018-10-29 MED ORDER — DILTIAZEM HCL ER COATED BEADS 180 MG PO CP24: ORAL_CAPSULE | ORAL | 3 refills | Status: DC

## 2018-10-29 NOTE — Patient Instructions (Addendum)
Check with your insurance to see if they will cover the shingrix shot. Don't change your meds for now.  Update me as needed.  Take care.  Glad to see you.

## 2018-10-29 NOTE — Progress Notes (Signed)
I have personally reviewed the Medicare Annual Wellness questionnaire and have noted 1. The patient's medical and social history 2. Their use of alcohol, tobacco or illicit drugs 3. Their current medications and supplements 4. The patient's functional ability including ADL's, fall risks, home safety risks and hearing or visual             impairment. 5. Diet and physical activities 6. Evidence for depression or mood disorders  The patients weight, height, BMI have been recorded in the chart and visual acuity is per eye clinic.  I have made referrals, counseling and provided education to the patient based review of the above and I have provided the pt with a written personalized care plan for preventive services.  Provider list updated- see scanned forms.  Routine anticipatory guidance given to patient.  See health maintenance. The possibility exists that previously documented standard health maintenance information may have been brought forward from a previous encounter into this note.  If needed, that same information has been updated to reflect the current situation based on today's encounter.    Flu 2019 Shingles out of stock, discussed with patient. PNA up-to-date Tetanus 2019 Colonoscopy not indicated given his age. Prostate cancer screening not indicated given his age. Advance directive-wife designated if patient were incapacitated.  Son designated if wife were unavailable or incapacitated. Cognitive function addressed- see scanned forms- and if abnormal then additional documentation follows. 2/3 on recall 1st and 2nd attempt, no red flag events, we agreed to follow.    Elevated Cholesterol: Using medications without problems: yes Muscle aches: no Diet compliance: yes Exercise: encouraged.  Mainly yardwork.   Labs d/w pt.    H/o Barretts, still on PPI.  No ADE on med.   He has eye clinic f/u pending.   Hypertension:    Using medication without problems or lightheadedness:  yes Chest pain with exertion:no Edema:no Short of breath:no  RLS.  Still on requip.  Still on iron.  With relief of RLS sx.   Taking tramadol for joint pain, esp his lower back.  Still using gabapentin for back pain.   No adverse effect on meds.  With relief from pain.  PMH and SH reviewed  Meds, vitals, and allergies reviewed.   ROS: Per HPI.  Unless specifically indicated otherwise in HPI, the patient denies:  General: fever. Eyes: acute vision changes ENT: sore throat Cardiovascular: chest pain Respiratory: SOB GI: vomiting GU: dysuria Musculoskeletal: acute back pain Derm: acute rash Neuro: acute motor dysfunction Psych: worsening mood Endocrine: polydipsia Heme: bleeding Allergy: hayfever  GEN: nad, alert and oriented HEENT: mucous membranes moist NECK: supple w/o LA CV: rrr. PULM: ctab, no inc wob ABD: soft, +bs EXT: no edema SKIN: no acute rash

## 2018-10-30 ENCOUNTER — Ambulatory Visit (INDEPENDENT_AMBULATORY_CARE_PROVIDER_SITE_OTHER): Payer: PPO | Admitting: Physician Assistant

## 2018-10-30 ENCOUNTER — Other Ambulatory Visit: Payer: Self-pay | Admitting: Family Medicine

## 2018-10-30 ENCOUNTER — Encounter: Payer: Self-pay | Admitting: Physician Assistant

## 2018-10-30 VITALS — BP 138/50 | HR 65 | Ht 66.5 in | Wt 161.8 lb

## 2018-10-30 DIAGNOSIS — I10 Essential (primary) hypertension: Secondary | ICD-10-CM | POA: Diagnosis not present

## 2018-10-30 DIAGNOSIS — I2510 Atherosclerotic heart disease of native coronary artery without angina pectoris: Secondary | ICD-10-CM | POA: Diagnosis not present

## 2018-10-30 DIAGNOSIS — Z8673 Personal history of transient ischemic attack (TIA), and cerebral infarction without residual deficits: Secondary | ICD-10-CM | POA: Diagnosis not present

## 2018-10-30 NOTE — Patient Instructions (Signed)
Medication Instructions: Your physician recommends that you continue on your current medications as directed. Please refer to the Current Medication list given to you today.   If you need a refill on your cardiac medications before your next appointment, please call your pharmacy.   Lab work: NONE ORDERED  TODAY   If you have labs (blood work) drawn today and your tests are completely normal, you will receive your results only by: [name] Kitchen MyChart Message (if you have MyChart) OR . A paper copy in the mail If you have any lab test that is abnormal or we need to change your treatment, we will call you to review the results.  Testing/Procedures:NONE ORDERED  TODAY    Follow-Up: At [facility], you and your health needs are our priority.  As part of our continuing mission to provide you with exceptional heart care, we have created designated Provider Care Teams.  These Care Teams include your primary Cardiologist (physician) and Advanced Practice Providers (APPs -  Physician Assistants and Nurse Practitioners) who all work together to provide you with the care you need, when you need it. You will need a follow up appointment in 1 years.  Please call our office 2 months in advance to schedule this appointment.  You may see Tereso Newcomer

## 2018-10-30 NOTE — Progress Notes (Signed)
Cardiology Office Note:    Date:  10/30/2018   ID:  [name], [name] February 17, 1926, MRN [id number]  PCP:  Martin Nam, MD  Cardiologist:  Martin Muss, MD  / Martin Newcomer, PA-C  Electrophysiologist:  None   Referring MD: Martin Nam, MD   Chief Complaint  Patient presents with  . Follow-up    CAD     History of Present Illness:    [name] is a 85 y.o. male with prior cryptogenic stroke in 6/16 and splenic infarct status post ILR, HTN, HL, COPD, von Willebrand's disease.  HisILR is followed by EP. He has known atherosclerosis on a prior chest CT. In 2017, he c/o one episode chest painand aNuclear stress testdemonstrated inferior ischemia. Med Rx was recommended unless he had recurrent symptoms.   I saw him last in October 2019.   [name] returns for follow up.  He is here with his wife. He is excited about planting his tomatoes soon.  His son and grandson help with planting, but he tries to go out an help every day.  He has not had chest pain, significant shortness of breath, syncope.  He does get indigestion if he eats something greasy or spicy.    Prior CV studies:   The following studies were reviewed today:  Chest CT 04/06/16 IMPRESSION: 1. The nodule questioned on left shoulder films represents a small calcified granuloma within the superior segment of the left lower lobe. There also are small calcified splenic granulomas consistent with prior granulomatous disease. 2. Moderate thoracic aortic atherosclerosis. 3. Calcification of the coronary arteries diffusely. 4. Paraseptal emphysema.  Myoview 03/25/16 Medium size, moderate intensity (SDS 5) reversible inferior perfusion defect which could suggest ischemia. Study was not gated due to frequent ventricular ectopy. This is an intermediate risk study.  TEE 10/28/15 EF 55%, normal wall motion, grade 4 plaque descending thoracic aorta, trivial MR, normal RVSF, no right-to-left atrial shunt  Event  monitor 8/16 Sinus rhythm Rare pacs and pvcs No sustained arrhythmias No afib  Echo 6/16 Mild LVH, EF 85%, normal wall motion, mildly reduced RVSF, PASP 37 mmHg  Past Medical History:  Diagnosis Date  . Back pain    improved with gabapentin as of 2015  . Barrett esophagus 09/2003  . Coronary artery calcification seen on CT scan   . Diverticulitis   . Diverticulosis   . GERD (gastroesophageal reflux disease)   . GI AVM (gastrointestinal arteriovenous vascular malformation)   . Hearing loss   . Hemorrhoids   . History of Holter monitoring    a. holter 9/17: NSR, PVCs; no sustained arrhythmia; PVC burden 8%  . History of nuclear stress test    a. Myoview 8/17: Intermediate risk, inferior ischemia, not gated  . History of prostatitis   . History of stroke   . Hyperlipidemia   . Hypertension   . Macular degeneration, wet (HCC)    receiving intra-ocular injections ([facility])  . PUD (peptic ulcer disease)    Surgical Hx: The patient  has a past surgical history that includes Inguinal hernia repair (2001); laparoscopic appendectomy (N/A, 07/07/2014); TEE without cardioversion (N/A, 10/28/2015); and Cardiac catheterization (N/A, 12/24/2015).   Current Medications: Current Meds  Medication Sig  . Alirocumab (PRALUENT) 150 MG/ML SOAJ Inject 150 mg into the skin every 14 (fourteen) days.  . Bevacizumab (AVASTIN IV) Place 1 Applicatorful into both eyes See admin instructions. Right eye every 7 weeks Left eye every 8 weeks  . cetirizine (ZYRTEC)  10 MG tablet Take 10 mg by mouth daily as needed for allergies.   [name] Kitchen clopidogrel (PLAVIX) 75 MG tablet TAKE 1 TABLET BY MOUTH EVERY DAY  . diltiazem (CARDIZEM CD) 180 MG 24 hr capsule TAKE ONE (1) CAPSULE BY MOUTH EACH DAY  . ferrous sulfate 325 (65 FE) MG tablet Take 1 tablet (325 mg total) by mouth daily with breakfast.  . fluticasone (FLONASE) 50 MCG/ACT nasal spray Place 2 sprays into both nostrils daily as needed for allergies.  [name] Kitchen gabapentin  (NEURONTIN) 300 MG capsule Take 1 capsule (300 mg total) by mouth 2 (two) times daily.  [name] Kitchen gemfibrozil (LOPID) 600 MG tablet TAKE 1 TABLET BY MOUTH TWICE A DAY  . guaiFENesin (ROBITUSSIN) 100 MG/5ML SOLN Take 5 mLs (100 mg total) by mouth every 6 (six) hours as needed for cough.  . hydrochlorothiazide (HYDRODIURIL) 25 MG tablet Take 1 tablet (25 mg total) by mouth daily.  . Hypromellose (ARTIFICIAL TEARS OP) Apply 1 drop to eye daily as needed (itchy / watery eyes).  . meclizine (ANTIVERT) 25 MG tablet Take 0.5 tablets (12.5 mg total) by mouth 3 (three) times daily as needed for dizziness.  . Multiple Vitamins-Minerals (OCUVITE ADULT 50+ PO) Take 1 capsule by mouth daily.    . nitroGLYCERIN (NITROSTAT) 0.4 MG SL tablet Place 1 tablet (0.4 mg total) under the tongue every 5 (five) minutes as needed for chest pain (max 3 doses in 15 minutes).  . pantoprazole (PROTONIX) 40 MG tablet TAKE 1 TABLET BY MOUTH EVERY DAY IN THE MORNING  . rOPINIRole (REQUIP) 1 MG tablet TAKE 1 TABLET (1 MG TOTAL) BY MOUTH AT BEDTIME.  . sucralfate (CARAFATE) 1 g tablet Take 1 g by mouth daily.   . tamsulosin (FLOMAX) 0.4 MG CAPS capsule Take 0.4 mg by mouth daily.   . traMADol (ULTRAM) 50 MG tablet TAKE ONE TABLET BY MOUTH EVERY EIGHT HOURS AS NEEDED FOR MODERATE TO SEVERE PAIN  . vitamin B-12 (CYANOCOBALAMIN) 1000 MCG tablet Take 2 tablets (2,000 mcg total) by mouth daily.     Allergies:   Angiotensin receptor blockers; Atorvastatin; Codeine; Lisinopril; and Statins   Social History   Tobacco Use  . Smoking status: Former Smoker    Packs/day: 0.00    Years: 36.00    Pack years: 0.00    Types: Cigarettes  . Smokeless tobacco: Never Used  . Tobacco comment: Quit in 1983  Substance Use Topics  . Alcohol use: No    Alcohol/week: 0.0 standard drinks  . Drug use: No     Family Hx: The patient's family history includes Arthritis in his sister; Bleeding Disorder in his son; Cancer in his brother and mother;  Coronary artery disease in his father; Heart attack in his father; Hyperlipidemia in his sister; Hypertension in his mother; Pancreatic cancer in his mother; Prostate cancer in his brother. There is no history of Diabetes.  ROS:   Please see the history of present illness.    Review of Systems  Musculoskeletal: Positive for back pain.  Gastrointestinal: Positive for diarrhea.   All other systems reviewed and are negative.   EKGs/Labs/Other Test Reviewed:    EKG:  EKG is not ordered today.    Recent Labs: 10/22/2018: ALT 9; BUN 20; Creatinine, Ser 1.33; Hemoglobin 14.3; Platelets 306.0; Potassium 3.5; Sodium 139   Recent Lipid Panel Lab Results  Component Value Date/Time   CHOL 122 10/22/2018 02:00 PM   TRIG 97.0 10/22/2018 02:00 PM   HDL 48.00 10/22/2018 02:00  PM   CHOLHDL 3 10/22/2018 02:00 PM   LDLCALC 54 10/22/2018 02:00 PM   LDLDIRECT 167.2 08/12/2013 10:24 AM    Physical Exam:    VS:  BP (!) 138/50   Pulse 65   Ht 5' 6.5" (1.689 m)   Wt 161 lb 12.8 oz (73.4 kg)   SpO2 95%   BMI 25.72 kg/m     Wt Readings from Last 3 Encounters:  10/30/18 161 lb 12.8 oz (73.4 kg)  10/29/18 161 lb 5 oz (73.2 kg)  06/05/18 154 lb 6.4 oz (70 kg)     Physical Exam  Constitutional: He is oriented to person, place, and time. He appears well-developed and well-nourished. No distress.  HENT:  Head: Normocephalic and atraumatic.  Neck: No JVD present.  Cardiovascular: Normal rate and regular rhythm.  No murmur heard. Pulmonary/Chest: Effort normal. He has no rales.  Abdominal: Soft.  Musculoskeletal:        General: No edema.  Neurological: He is alert and oriented to person, place, and time.  Skin: Skin is warm and dry.    ASSESSMENT & PLAN:    Coronary artery disease involving native coronary artery of native heart without angina pectoris Hx of coronary Ca noted on CT in the past.  Myoview in 2017 demonstrated inferior ischemia.  He has been managed medically.  He has not had  any anginal symptoms.  Continue Clopidogrel, Alirocumab.  Recent LDL optimal.    History of CVA (cerebrovascular accident) S/p ILR.  Device interrogations reviewed.  Notes indicate the patient may have had some atrial fibrillation in 07/2018.  No changes were made and Dr. Johney Frame continues to monitor this for recurrence.    Essential hypertension   The patient's blood pressure is controlled on his current regimen.  Continue current therapy.     Dispo:  Return in about 1 year (around 10/30/2019) for Routine Follow Up, w/ Martin Newcomer, PA-C.   Medication Adjustments/Labs and Tests Ordered: Current medicines are reviewed at length with the patient today.  Concerns regarding medicines are outlined above.  Tests Ordered: No orders of the defined types were placed in this encounter.  Medication Changes: No orders of the defined types were placed in this encounter.   Signed, Martin Newcomer, PA-C  10/30/2018 12:09 PM    [facility] [address] [place], [place], [place]  [address] Phone: [phone]; Fax: [phone]

## 2018-10-30 NOTE — Telephone Encounter (Signed)
Electronic refill request. Gabapentin Last office visit:   10/29/2018 CPE Last Filled:   #180  3 RF on 10/27/2017 Please advise.

## 2018-10-31 ENCOUNTER — Ambulatory Visit: Payer: PPO | Admitting: Physician Assistant

## 2018-10-31 NOTE — Assessment & Plan Note (Signed)
No change in meds at this point.  Reasonable control.  He agrees.

## 2018-10-31 NOTE — Assessment & Plan Note (Signed)
Advance directive-wife designated if patient were incapacitated. Son designated if wife were unavailable or incapacitated. 

## 2018-10-31 NOTE — Assessment & Plan Note (Signed)
Continue current medications.  Labs discussed with patient.  Continue work on diet and exercise.  He agrees.

## 2018-10-31 NOTE — Assessment & Plan Note (Signed)
Flu 2019 Shingles out of stock, discussed with patient. PNA up-to-date Tetanus 2019 Colonoscopy not indicated given his age. Prostate cancer screening not indicated given his age. Advance directive-wife designated if patient were incapacitated.  Son designated if wife were unavailable or incapacitated. Cognitive function addressed- see scanned forms- and if abnormal then additional documentation follows. 2/3 on recall 1st and 2nd attempt, no red flag events, we agreed to follow.

## 2018-10-31 NOTE — Assessment & Plan Note (Signed)
Taking tramadol for joint pain, esp his lower back.  Still using gabapentin for back pain.   No adverse effect on meds.  With relief from pain.  Continue as is.  He agrees.

## 2018-10-31 NOTE — Assessment & Plan Note (Signed)
Continue PPI.  No adverse effect on medication.  Discussed.

## 2018-10-31 NOTE — Assessment & Plan Note (Signed)
Still on requip.  Still on iron.  With relief of RLS sx.

## 2018-10-31 NOTE — Telephone Encounter (Signed)
Sent. Thanks.   

## 2018-11-01 DIAGNOSIS — H353211 Exudative age-related macular degeneration, right eye, with active choroidal neovascularization: Secondary | ICD-10-CM | POA: Diagnosis not present

## 2018-11-01 DIAGNOSIS — H35423 Microcystoid degeneration of retina, bilateral: Secondary | ICD-10-CM | POA: Diagnosis not present

## 2018-11-01 DIAGNOSIS — H353231 Exudative age-related macular degeneration, bilateral, with active choroidal neovascularization: Secondary | ICD-10-CM | POA: Diagnosis not present

## 2018-11-01 DIAGNOSIS — H43813 Vitreous degeneration, bilateral: Secondary | ICD-10-CM | POA: Diagnosis not present

## 2018-11-05 ENCOUNTER — Ambulatory Visit (INDEPENDENT_AMBULATORY_CARE_PROVIDER_SITE_OTHER): Payer: PPO | Admitting: Family Medicine

## 2018-11-05 ENCOUNTER — Ambulatory Visit: Payer: PPO | Admitting: Family Medicine

## 2018-11-05 ENCOUNTER — Encounter: Payer: Self-pay | Admitting: Family Medicine

## 2018-11-05 ENCOUNTER — Other Ambulatory Visit: Payer: Self-pay

## 2018-11-05 DIAGNOSIS — R059 Cough, unspecified: Secondary | ICD-10-CM

## 2018-11-05 DIAGNOSIS — R05 Cough: Secondary | ICD-10-CM

## 2018-11-05 NOTE — Progress Notes (Signed)
Sx started about 5 days ago.  No known fevers.  Cough with sputum prev.  No vomiting, no diarrhea.  No abd pain.  Prev sputum was white.  Less sputum now.  Less cough now.  Clearly feels better now.  Taking tussin OTC and that helped.  No ear pain.  No ST but prev with some irritation- this is better.  Not SOB.   Meds, vitals, and allergies reviewed.   ROS: Per HPI unless specifically indicated in ROS section   GEN: nad, alert and oriented HEENT: mucous membranes moist, OP wnl NECK: supple w/o LA CV: rrr. PULM: ctab, no inc wob, no cough ABD: soft, +bs EXT: no edema SKIN: well perfused.

## 2018-11-05 NOTE — Patient Instructions (Signed)
Your lungs are clear.  Update [place] as needed, especially if you feel worse or have a fever.  Take care.  Glad to see you.

## 2018-11-06 NOTE — Assessment & Plan Note (Signed)
Likely already resolving benign viral issue.  His lungs are clear.  Update [place] as needed, especially if feeling worse or a fever.  He agrees.  No tx at this point.

## 2018-11-08 DIAGNOSIS — H353221 Exudative age-related macular degeneration, left eye, with active choroidal neovascularization: Secondary | ICD-10-CM | POA: Diagnosis not present

## 2018-11-19 ENCOUNTER — Other Ambulatory Visit: Payer: Self-pay

## 2018-11-19 ENCOUNTER — Ambulatory Visit (INDEPENDENT_AMBULATORY_CARE_PROVIDER_SITE_OTHER): Payer: PPO | Admitting: *Deleted

## 2018-11-19 DIAGNOSIS — I63429 Cerebral infarction due to embolism of unspecified anterior cerebral artery: Secondary | ICD-10-CM

## 2018-11-19 LAB — CUP PACEART REMOTE DEVICE CHECK
Date Time Interrogation Session: 20200329164205
Implantable Pulse Generator Implant Date: 20170504

## 2018-11-24 ENCOUNTER — Other Ambulatory Visit: Payer: Self-pay | Admitting: Family Medicine

## 2018-11-27 NOTE — Progress Notes (Signed)
Carelink Summary Report / Loop Recorder 

## 2018-11-29 DIAGNOSIS — H353211 Exudative age-related macular degeneration, right eye, with active choroidal neovascularization: Secondary | ICD-10-CM | POA: Diagnosis not present

## 2018-11-29 DIAGNOSIS — H353231 Exudative age-related macular degeneration, bilateral, with active choroidal neovascularization: Secondary | ICD-10-CM | POA: Diagnosis not present

## 2018-11-29 DIAGNOSIS — I509 Heart failure, unspecified: Secondary | ICD-10-CM | POA: Diagnosis not present

## 2018-11-29 DIAGNOSIS — R591 Generalized enlarged lymph nodes: Secondary | ICD-10-CM | POA: Diagnosis not present

## 2018-12-11 ENCOUNTER — Telehealth: Payer: Self-pay

## 2018-12-11 MED ORDER — FAMOTIDINE 20 MG PO TABS: 20.0000 mg | ORAL_TABLET | Freq: Two times a day (BID) | ORAL | 3 refills | Status: DC | PRN

## 2018-12-11 NOTE — Telephone Encounter (Signed)
Would try pepcid BID if needed.  rx sent.  He also has rx for protonix and he can take that with plavix. He can use that and then add on pepcid if needed.  Thanks.

## 2018-12-11 NOTE — Telephone Encounter (Signed)
Mrs [name] [facility] signed) said Pt can no longer get the ranitidine; pt cannot take nexium or prevacid due to taking plavix and [name] request new med for reflux to [facility]. Last annual 10/29/18.

## 2018-12-11 NOTE — Telephone Encounter (Signed)
Spoken and notified patient's wife of Dr Lianne Bushy comments. Patient's wife verbalized understanding.

## 2018-12-21 ENCOUNTER — Other Ambulatory Visit: Payer: Self-pay

## 2018-12-21 ENCOUNTER — Ambulatory Visit (INDEPENDENT_AMBULATORY_CARE_PROVIDER_SITE_OTHER): Payer: PPO | Admitting: *Deleted

## 2018-12-21 DIAGNOSIS — I63429 Cerebral infarction due to embolism of unspecified anterior cerebral artery: Secondary | ICD-10-CM

## 2018-12-21 LAB — CUP PACEART REMOTE DEVICE CHECK
Date Time Interrogation Session: 20200501144534
Implantable Pulse Generator Implant Date: 20170504

## 2018-12-27 NOTE — Progress Notes (Signed)
Carelink Summary Report / Loop Recorder 

## 2018-12-31 DIAGNOSIS — H353211 Exudative age-related macular degeneration, right eye, with active choroidal neovascularization: Secondary | ICD-10-CM | POA: Diagnosis not present

## 2018-12-31 DIAGNOSIS — H353231 Exudative age-related macular degeneration, bilateral, with active choroidal neovascularization: Secondary | ICD-10-CM | POA: Diagnosis not present

## 2019-01-03 DIAGNOSIS — H353221 Exudative age-related macular degeneration, left eye, with active choroidal neovascularization: Secondary | ICD-10-CM | POA: Diagnosis not present

## 2019-01-08 ENCOUNTER — Other Ambulatory Visit: Payer: Self-pay | Admitting: Family Medicine

## 2019-01-09 NOTE — Telephone Encounter (Signed)
Last filled 12-11-18 #60 Last OV 12-21-18 No Future OV [facility]

## 2019-01-10 NOTE — Telephone Encounter (Signed)
Sent. Thanks.   

## 2019-01-17 ENCOUNTER — Encounter: Payer: Self-pay | Admitting: Family Medicine

## 2019-01-17 ENCOUNTER — Other Ambulatory Visit: Payer: Self-pay

## 2019-01-17 ENCOUNTER — Ambulatory Visit (INDEPENDENT_AMBULATORY_CARE_PROVIDER_SITE_OTHER): Payer: PPO | Admitting: Family Medicine

## 2019-01-17 VITALS — BP 116/70 | HR 80 | Temp 98.0°F | Ht 66.5 in | Wt 155.5 lb

## 2019-01-17 DIAGNOSIS — G2581 Restless legs syndrome: Secondary | ICD-10-CM | POA: Diagnosis not present

## 2019-01-17 DIAGNOSIS — R2681 Unsteadiness on feet: Secondary | ICD-10-CM

## 2019-01-17 MED ORDER — ROPINIROLE HCL 1 MG PO TABS: 0.5000 mg | ORAL_TABLET | Freq: Every day | ORAL | Status: DC

## 2019-01-17 MED ORDER — GABAPENTIN 300 MG PO CAPS: 300.0000 mg | ORAL_CAPSULE | Freq: Every day | ORAL | Status: DC

## 2019-01-17 NOTE — Patient Instructions (Signed)
Go to the lab on the way out.  We'll contact you with your lab report. Try cutting the requip back to 1/2 tab at night.  See if that helps.  If you keep having trouble, then cut back on the gabapentin to 1 pill at night (stop the morning dose).  Update me as needed.  Take care.  Glad to see you.

## 2019-01-17 NOTE — Progress Notes (Signed)
Sx started about 2-3 days ago.  No pain but he doesn't feel well.  Sx came on quickly per report.  It wasn't a gradual onset.  The room doesn't spin but he feels off balance. No falls.  Not lightheaded on standing.    He notes sx right when he lays down but then the sx get better.  He will then predictably have more sx with rolling over.    Some fatigue.  Swallowing well.  No changes in handwriting.  Speech sounds the same to me and wife.  No arm or leg weakness.  He has occ intermittent needle sensation in the R arm and leg, not noted today.  Noted a few days ago but this seems to be separate from his otherwise reproducible positional vertigo.  No CP. Not SOB.  Some occ abd pain, noted if taking meds with cold water in the AM. This isn't an acute change. No abd pain.  No blood in stool or urine.  No fevers.  No vomiting.    HOH at baseline.  History of restless leg syndrome on Requip, which had previously helped.  Had been on gabapentin for years for back pain.     PMH and SH reviewed  ROS: Per HPI unless specifically indicated in ROS section   Meds, vitals, and allergies reviewed.   GEN: nad, alert and oriented HEENT: Normal smile, hard of hearing at baseline.  Tympanic membranes within normal limits bilaterally. NECK: supple w/o LA CV: rrr. PULM: ctab, no inc wob ABD: soft, +bs EXT: no edema SKIN: no acute rash CN 2-12 wnl B, S/S/DTR wnl x4, except for baseline hearing loss. Gait normal office visit when he is walking in the hallway.

## 2019-01-18 ENCOUNTER — Ambulatory Visit: Payer: PPO | Admitting: Family Medicine

## 2019-01-18 LAB — COMPREHENSIVE METABOLIC PANEL
ALT: 9 U/L (ref 0–53)
AST: 16 U/L (ref 0–37)
Albumin: 4 g/dL (ref 3.5–5.2)
Alkaline Phosphatase: 82 U/L (ref 39–117)
BUN: 21 mg/dL (ref 6–23)
CO2: 25 mEq/L (ref 19–32)
Calcium: 9.1 mg/dL (ref 8.4–10.5)
Chloride: 104 mEq/L (ref 96–112)
Creatinine, Ser: 1.16 mg/dL (ref 0.40–1.50)
GFR: 58.98 mL/min — ABNORMAL LOW (ref >60.00)
Glucose, Bld: 85 mg/dL (ref 70–99)
Potassium: 3.5 mEq/L (ref 3.5–5.1)
Sodium: 138 mEq/L (ref 135–145)
Total Bilirubin: 0.3 mg/dL (ref 0.2–1.2)
Total Protein: 7.2 g/dL (ref 6.0–8.3)

## 2019-01-18 LAB — CBC WITH DIFFERENTIAL/PLATELET
Basophils Absolute: 0 10*3/uL (ref 0.0–0.1)
Basophils Relative: 0.6 % (ref 0.0–3.0)
Eosinophils Absolute: 0.3 10*3/uL (ref 0.0–0.7)
Eosinophils Relative: 4.1 % (ref 0.0–5.0)
HCT: 42.3 % (ref 39.0–52.0)
Hemoglobin: 14.4 g/dL (ref 13.0–17.0)
Lymphocytes Relative: 29.2 % (ref 12.0–46.0)
Lymphs Abs: 1.9 10*3/uL (ref 0.7–4.0)
MCHC: 34 g/dL (ref 30.0–36.0)
MCV: 89.2 fl (ref 78.0–100.0)
Monocytes Absolute: 0.7 10*3/uL (ref 0.1–1.0)
Monocytes Relative: 11.2 % (ref 3.0–12.0)
Neutro Abs: 3.6 10*3/uL (ref 1.4–7.7)
Neutrophils Relative %: 54.9 % (ref 43.0–77.0)
Platelets: 265 10*3/uL (ref 150.0–400.0)
RBC: 4.75 Mil/uL (ref 4.22–5.81)
RDW: 13.4 % (ref 11.5–15.5)
WBC: 6.6 10*3/uL (ref 4.0–10.5)

## 2019-01-20 DIAGNOSIS — R2681 Unsteadiness on feet: Secondary | ICD-10-CM | POA: Insufficient documentation

## 2019-01-20 NOTE — Assessment & Plan Note (Signed)
He does not have typical stroke symptoms.  He is already on appropriate medication for stroke prevention and he is clearly out of the 3-hour window for treatment at this point, even if he did have an ischemic event.  Discussed options.  He has a normal gait and speech right now.  There is no obvious emergent findings at this point.  We will check routine labs today.  See notes on labs.  Unclear if his symptoms are related to medication effect. Try cutting the requip back to 1/2 tab at night.  He will see if that helps.  If still having trouble, then cut back on the gabapentin to 1 pill at night (stop the morning dose).  Update me as needed.  He agrees with plan >25 minutes spent in face to face time with patient, >50% spent in counselling or coordination of care

## 2019-01-23 ENCOUNTER — Ambulatory Visit (INDEPENDENT_AMBULATORY_CARE_PROVIDER_SITE_OTHER): Payer: PPO | Admitting: *Deleted

## 2019-01-23 DIAGNOSIS — I63429 Cerebral infarction due to embolism of unspecified anterior cerebral artery: Secondary | ICD-10-CM

## 2019-01-24 LAB — CUP PACEART REMOTE DEVICE CHECK
Date Time Interrogation Session: 20200603181206
Implantable Pulse Generator Implant Date: 20170504

## 2019-01-28 DIAGNOSIS — H353211 Exudative age-related macular degeneration, right eye, with active choroidal neovascularization: Secondary | ICD-10-CM | POA: Diagnosis not present

## 2019-01-28 DIAGNOSIS — H353231 Exudative age-related macular degeneration, bilateral, with active choroidal neovascularization: Secondary | ICD-10-CM | POA: Diagnosis not present

## 2019-01-30 ENCOUNTER — Encounter: Payer: Self-pay | Admitting: Family Medicine

## 2019-01-30 ENCOUNTER — Other Ambulatory Visit: Payer: Self-pay

## 2019-01-30 ENCOUNTER — Telehealth: Payer: Self-pay | Admitting: Family Medicine

## 2019-01-30 ENCOUNTER — Ambulatory Visit (INDEPENDENT_AMBULATORY_CARE_PROVIDER_SITE_OTHER): Payer: PPO | Admitting: Family Medicine

## 2019-01-30 VITALS — BP 136/62 | HR 78 | Temp 97.6°F | Ht 66.5 in | Wt 158.5 lb

## 2019-01-30 DIAGNOSIS — R1030 Lower abdominal pain, unspecified: Secondary | ICD-10-CM | POA: Diagnosis not present

## 2019-01-30 LAB — POC URINALSYSI DIPSTICK (AUTOMATED)
Bilirubin, UA: NEGATIVE
Blood, UA: NEGATIVE
Glucose, UA: NEGATIVE
Ketones, UA: NEGATIVE
Leukocytes, UA: NEGATIVE
Nitrite, UA: NEGATIVE
Protein, UA: NEGATIVE
Spec Grav, UA: 1.02 (ref 1.010–1.025)
Urobilinogen, UA: 0.2 E.U./dL
pH, UA: 6 (ref 5.0–8.0)

## 2019-01-30 LAB — CBC WITH DIFFERENTIAL/PLATELET
Basophils Absolute: 0 10*3/uL (ref 0.0–0.1)
Basophils Relative: 0.5 % (ref 0.0–3.0)
Eosinophils Absolute: 0.2 10*3/uL (ref 0.0–0.7)
Eosinophils Relative: 3.5 % (ref 0.0–5.0)
HCT: 41.1 % (ref 39.0–52.0)
Hemoglobin: 13.8 g/dL (ref 13.0–17.0)
Lymphocytes Relative: 18.3 % (ref 12.0–46.0)
Lymphs Abs: 1.3 10*3/uL (ref 0.7–4.0)
MCHC: 33.5 g/dL (ref 30.0–36.0)
MCV: 89.8 fl (ref 78.0–100.0)
Monocytes Absolute: 0.7 10*3/uL (ref 0.1–1.0)
Monocytes Relative: 9.4 % (ref 3.0–12.0)
Neutro Abs: 4.8 10*3/uL (ref 1.4–7.7)
Neutrophils Relative %: 68.3 % (ref 43.0–77.0)
Platelets: 255 10*3/uL (ref 150.0–400.0)
RBC: 4.58 Mil/uL (ref 4.22–5.81)
RDW: 13.3 % (ref 11.5–15.5)
WBC: 7 10*3/uL (ref 4.0–10.5)

## 2019-01-30 LAB — COMPREHENSIVE METABOLIC PANEL
ALT: 10 U/L (ref 0–53)
AST: 16 U/L (ref 0–37)
Albumin: 3.8 g/dL (ref 3.5–5.2)
Alkaline Phosphatase: 69 U/L (ref 39–117)
BUN: 19 mg/dL (ref 6–23)
CO2: 27 mEq/L (ref 19–32)
Calcium: 9.1 mg/dL (ref 8.4–10.5)
Chloride: 104 mEq/L (ref 96–112)
Creatinine, Ser: 1.11 mg/dL (ref 0.40–1.50)
GFR: 62.05 mL/min (ref >60.00)
Glucose, Bld: 136 mg/dL — ABNORMAL HIGH (ref 70–99)
Potassium: 3.4 mEq/L — ABNORMAL LOW (ref 3.5–5.1)
Sodium: 139 mEq/L (ref 135–145)
Total Bilirubin: 0.4 mg/dL (ref 0.2–1.2)
Total Protein: 6.9 g/dL (ref 6.0–8.3)

## 2019-01-30 LAB — LIPASE: Lipase: 19 U/L (ref 11.0–59.0)

## 2019-01-30 MED ORDER — AMOXICILLIN-POT CLAVULANATE 875-125 MG PO TABS: 1.0000 | ORAL_TABLET | Freq: Two times a day (BID) | ORAL | 0 refills | Status: AC

## 2019-01-30 NOTE — Assessment & Plan Note (Signed)
Pt endorses similar abdominal pain to when he previously had diverticulitis. Anticipate recurrent flare. Will treat with augmentin course, discussed bowel rest for next several days (clear liquid transitioning to low fiber). Check CBC, CMP, lipase and UA today. Discussed indications for CT scan if not improving. Reviewed red flags to seek ER care over weekend. Pt and wife agree with plan.

## 2019-01-30 NOTE — Progress Notes (Signed)
This visit was conducted in person.  BP 136/62 (BP Location: Right Arm, Patient Position: Sitting, Cuff Size: Normal)   Pulse 78   Temp 97.6 F (36.4 C) (Oral)   Ht 5' 6.5" (1.689 m)   Wt 158 lb 8 oz (71.9 kg)   SpO2 95%   BMI 25.20 kg/m    CC: abd pain Subjective:    Patient ID: Martin IvanJohn P Jeter, male    DOB: 30-Oct-1925, 85 y.o.   MRN: [id number]  HPI: [name] is a 85 y.o. male presenting on 01/30/2019 for Abdominal Pain (C/o mid abd pain. Concerned about diverticulitis episode. Says he at some strawberries. Pt accompanied by wife, [name]. )   2-3 d h/o lower abd pain described as twisting and throbbing ache, worse last night, today feeling some better. This feels just like prior diverticulitis flare.  Tylenol eases pain some.  He is having normal bowel movements, last this morning, passing gas well.   Worried because he recently ate some strawberries.   No fevers/chills, nausea/vomiting, constipation or diarrhea, blood in stool. No urinary symptoms of dysuria, urgency, hematuria. Chronic urinary urgency. No epigastric pain.   Recently saw PCP 01/17/2019 with unsteadiness/imbalance s/p reassuring labwork at that time. Recommended lower requip dose, considering lowering gabapentin dose.   Known h/o diverticulitis, barrett's esophagus, GERD with PUD, GI AVM.  H/o stroke.  S/p appendectomy 2015, inguinal hernia repair 2001.      Relevant past medical, surgical, family and social history reviewed and updated as indicated. Interim medical history since our last visit reviewed. Allergies and medications reviewed and updated. Outpatient Medications Prior to Visit  Medication Sig Dispense Refill  . Alirocumab (PRALUENT) 150 MG/ML SOAJ Inject 150 mg into the skin every 14 (fourteen) days. 6 pen 3  . Bevacizumab (AVASTIN IV) Place 1 Applicatorful into both eyes See admin instructions. Right eye every 7 weeks Left eye every 8 weeks    . cetirizine (ZYRTEC) 10 MG tablet Take 10 mg by  mouth daily as needed for allergies.     [name] Kitchen. clopidogrel (PLAVIX) 75 MG tablet TAKE 1 TABLET BY MOUTH EVERY DAY 90 tablet 3  . diltiazem (CARDIZEM CD) 180 MG 24 hr capsule TAKE ONE (1) CAPSULE BY MOUTH EACH DAY 90 capsule 3  . famotidine (PEPCID) 20 MG tablet Take 1 tablet (20 mg total) by mouth 2 (two) times daily as needed for heartburn or indigestion. 180 tablet 3  . ferrous sulfate 325 (65 FE) MG tablet Take 1 tablet (325 mg total) by mouth daily with breakfast.    . fluticasone (FLONASE) 50 MCG/ACT nasal spray Place 2 sprays into both nostrils daily as needed for allergies. 16 g 5  . gabapentin (NEURONTIN) 300 MG capsule Take 1 capsule (300 mg total) by mouth at bedtime.    [name] Kitchen. gemfibrozil (LOPID) 600 MG tablet TAKE 1 TABLET BY MOUTH TWICE A DAY 180 tablet 3  . guaiFENesin (ROBITUSSIN) 100 MG/5ML SOLN Take 5 mLs (100 mg total) by mouth every 6 (six) hours as needed for cough.    . hydrochlorothiazide (HYDRODIURIL) 25 MG tablet Take 1 tablet (25 mg total) by mouth daily. 90 tablet 3  . Hypromellose (ARTIFICIAL TEARS OP) Apply 1 drop to eye daily as needed (itchy / watery eyes).    . meclizine (ANTIVERT) 25 MG tablet Take 0.5 tablets (12.5 mg total) by mouth 3 (three) times daily as needed for dizziness.    . Multiple Vitamins-Minerals (OCUVITE ADULT 50+ PO) Take 1 capsule by  mouth daily.      . nitroGLYCERIN (NITROSTAT) 0.4 MG SL tablet Place 1 tablet (0.4 mg total) under the tongue every 5 (five) minutes as needed for chest pain (max 3 doses in 15 minutes). 25 tablet 0  . pantoprazole (PROTONIX) 40 MG tablet TAKE 1 TABLET BY MOUTH EVERY DAY IN THE MORNING 90 tablet 3  . rOPINIRole (REQUIP) 1 MG tablet Take 0.5 tablets (0.5 mg total) by mouth at bedtime.    . tamsulosin (FLOMAX) 0.4 MG CAPS capsule Take 0.4 mg by mouth daily.     . traMADol (ULTRAM) 50 MG tablet TAKE ONE TABLET BY MOUTH EVERY EIGHT HOURS AS NEEDED FOR MODERATE TO SEVERE PAIN 60 tablet 2  . vitamin B-12 (CYANOCOBALAMIN) 1000 MCG  tablet Take 2 tablets (2,000 mcg total) by mouth daily.    . sucralfate (CARAFATE) 1 g tablet Take 1 g by mouth daily.      No facility-administered medications prior to visit.      Per HPI unless specifically indicated in ROS section below Review of Systems Objective:    BP 136/62 (BP Location: Right Arm, Patient Position: Sitting, Cuff Size: Normal)   Pulse 78   Temp 97.6 F (36.4 C) (Oral)   Ht 5' 6.5" (1.689 m)   Wt 158 lb 8 oz (71.9 kg)   SpO2 95%   BMI 25.20 kg/m   Wt Readings from Last 3 Encounters:  01/30/19 158 lb 8 oz (71.9 kg)  01/17/19 155 lb 8 oz (70.5 kg)  11/05/18 157 lb 3 oz (71.3 kg)    Physical Exam Vitals signs and nursing note reviewed.  Constitutional:      General: He is not in acute distress.    Appearance: Normal appearance. He is not ill-appearing or toxic-appearing.  HENT:     Mouth/Throat:     Mouth: Mucous membranes are moist.     Pharynx: No oropharyngeal exudate.  Cardiovascular:     Rate and Rhythm: Normal rate and regular rhythm.     Pulses: Normal pulses.     Heart sounds: No murmur.  Pulmonary:     Effort: Pulmonary effort is normal. No respiratory distress.     Breath sounds: Normal breath sounds. No wheezing, rhonchi or rales.  Abdominal:     General: Abdomen is flat. Bowel sounds are normal. There is no distension.     Palpations: Abdomen is soft. There is no mass.     Tenderness: There is abdominal tenderness (moderate to palpation) in the right lower quadrant, suprapubic area and left lower quadrant. There is no right CVA tenderness, left CVA tenderness or rebound.     Hernia: No hernia is present.     Comments: No pain when not palpating abdomen  Musculoskeletal:     Right lower leg: No edema.     Left lower leg: No edema.  Neurological:     Mental Status: He is alert.       Lab Results  Component Value Date   CREATININE 1.16 01/17/2019   BUN 21 01/17/2019   NA 138 01/17/2019   K 3.5 01/17/2019   CL 104 01/17/2019    CO2 25 01/17/2019    Lab Results  Component Value Date   ALT 9 01/17/2019   AST 16 01/17/2019   ALKPHOS 82 01/17/2019   BILITOT 0.3 01/17/2019    Lab Results  Component Value Date   WBC 6.6 01/17/2019   HGB 14.4 01/17/2019   HCT 42.3 01/17/2019   MCV  89.2 01/17/2019   PLT 265.0 01/17/2019    Assessment & Plan:   Problem List Items Addressed This Visit    Lower abdominal pain - Primary    Pt endorses similar abdominal pain to when he previously had diverticulitis. Anticipate recurrent flare. Will treat with augmentin course, discussed bowel rest for next several days (clear liquid transitioning to low fiber). Check CBC, CMP, lipase and UA today. Discussed indications for CT scan if not improving. Reviewed red flags to seek ER care over weekend. Pt and wife agree with plan.       Relevant Orders   Comprehensive metabolic panel   CBC with Differential/Platelet   Lipase       Meds ordered this encounter  Medications  . amoxicillin-clavulanate (AUGMENTIN) 875-125 MG tablet    Sig: Take 1 tablet by mouth 2 (two) times daily for 10 days.    Dispense:  20 tablet    Refill:  0   Orders Placed This Encounter  Procedures  . Comprehensive metabolic panel  . CBC with Differential/Platelet  . Lipase    Follow up plan: Return if symptoms worsen or fail to improve.  Ria Bush, MD

## 2019-01-30 NOTE — Patient Instructions (Addendum)
Labs and urine checked today.  Possible diverticulitis - treat with augmentin course.  If worsening, let us know for CT or seek ER care.   Diverticulitis  Diverticulitis is when small pockets in your large intestine (colon) get infected or swollen. This causes stomach pain and watery poop (diarrhea). These pouches are called diverticula. They form in people who have a condition called diverticulosis. Follow these instructions at home: Medicines  Take over-the-counter and prescription medicines only as told by your doctor. These include: ? Antibiotics. ? Pain medicines. ? Fiber pills. ? Probiotics. ? Stool softeners.  Do not drive or use heavy machinery while taking prescription pain medicine.  If you were prescribed an antibiotic, take it as told. Do not stop taking it even if you feel better. General instructions   Follow a diet as told by your doctor.  When you feel better, your doctor may tell you to change your diet. You may need to eat a lot of fiber. Fiber makes it easier to poop (have bowel movements). Healthy foods with fiber include: ? Berries. ? Beans. ? Lentils. ? Green vegetables.  Exercise 3 or more times a week. Aim for 30 minutes each time. Exercise enough to sweat and make your heart beat faster.  Keep all follow-up visits as told. This is important. You may need to have an exam of the large intestine. This is called a colonoscopy. Contact a doctor if:  Your pain does not get better.  You have a hard time eating or drinking.  You are not pooping like normal. Get help right away if:  Your pain gets worse.  Your problems do not get better.  Your problems get worse very fast.  You have a fever.  You throw up (vomit) more than one time.  You have poop that is: ? Bloody. ? Black. ? Tarry. Summary  Diverticulitis is when small pockets in your large intestine (colon) get infected or swollen.  Take medicines only as told by your doctor.  Follow  a diet as told by your doctor. This information is not intended to replace advice given to you by your health care provider. Make sure you discuss any questions you have with your health care provider. Document Released: 01/25/2008 Document Revised: 08/25/2016 Document Reviewed: 08/25/2016 Elsevier Interactive Patient Education  2019 [employer].

## 2019-01-30 NOTE — Addendum Note (Signed)
Addended by: Brenton Grills on: 5/42/7062 37:62 AM   Modules accepted: Orders

## 2019-01-30 NOTE — Telephone Encounter (Signed)
Seen today for lower bad pain, treating for diverticulitis. plz call tomorrow for 24hr update on symptoms.

## 2019-01-30 NOTE — Telephone Encounter (Signed)
Noted  

## 2019-02-01 NOTE — Telephone Encounter (Signed)
Spoke with pt and pt's wife, Terrence Dupont (on dpr), asking how pt is doing.  States he is feeling 100% better.  Expresses his thanks for calling.

## 2019-02-01 NOTE — Telephone Encounter (Signed)
Noted  

## 2019-02-01 NOTE — Progress Notes (Signed)
Carelink Summary Report / Loop Recorder 

## 2019-02-03 NOTE — Telephone Encounter (Signed)
Noted. Thanks.

## 2019-02-06 ENCOUNTER — Other Ambulatory Visit: Payer: Self-pay

## 2019-02-06 NOTE — Patient Outreach (Signed)
[facility]) Care Management  02/06/2019  [name] 16-May-1926 [id number]   Telephone Screen  Referral Date: 02/06/2019 Referral Source: Nurse Call Center Referral Reason: "02/01/2019-8:01pm-caller states husband was stung on the ear by bee, she has Tylenol PM that has Benadryl, took Zyrtec this am, was stung at 5pm, swelling" Insurance: HTA   Outreach attempt # 1 to patient. Spoke with spouse(DPR on file). She reports that she called nurse line regarding bee sting that patient sustained. She reports she was given the okay by nurse at call center to administer Tylenol PM. She voices that patient took med and by later that evening the swelling had gone down. She reports that patient is feeling fine, back to normal and has had no further complaints. She denies any further RN CM needs or concerns at this time. Patient advised to feel free to call 24hr Nurse Line for any future needs or concerns. She voiced understanding and was appreciative of follow up call.    Plan: RN CM will close case as no further interventions needed at this time.   Enzo Montgomery, RN,BSN,CCM [facility] Telephonic Care Management Coordinator Direct Phone: [phone] Toll Free: [phone] Fax: [phone]

## 2019-02-18 ENCOUNTER — Other Ambulatory Visit: Payer: Self-pay | Admitting: Family Medicine

## 2019-02-18 DIAGNOSIS — I639 Cerebral infarction, unspecified: Secondary | ICD-10-CM

## 2019-02-21 DIAGNOSIS — H353221 Exudative age-related macular degeneration, left eye, with active choroidal neovascularization: Secondary | ICD-10-CM | POA: Diagnosis not present

## 2019-02-21 DIAGNOSIS — H35423 Microcystoid degeneration of retina, bilateral: Secondary | ICD-10-CM | POA: Diagnosis not present

## 2019-02-21 DIAGNOSIS — H353231 Exudative age-related macular degeneration, bilateral, with active choroidal neovascularization: Secondary | ICD-10-CM | POA: Diagnosis not present

## 2019-02-21 DIAGNOSIS — H43813 Vitreous degeneration, bilateral: Secondary | ICD-10-CM | POA: Diagnosis not present

## 2019-02-25 ENCOUNTER — Ambulatory Visit (INDEPENDENT_AMBULATORY_CARE_PROVIDER_SITE_OTHER): Payer: PPO | Admitting: *Deleted

## 2019-02-25 DIAGNOSIS — H353211 Exudative age-related macular degeneration, right eye, with active choroidal neovascularization: Secondary | ICD-10-CM | POA: Diagnosis not present

## 2019-02-25 DIAGNOSIS — I63429 Cerebral infarction due to embolism of unspecified anterior cerebral artery: Secondary | ICD-10-CM | POA: Diagnosis not present

## 2019-02-25 LAB — CUP PACEART REMOTE DEVICE CHECK
Date Time Interrogation Session: 20200706155108
Implantable Pulse Generator Implant Date: 20170504

## 2019-02-27 ENCOUNTER — Telehealth: Payer: Self-pay

## 2019-02-27 NOTE — Telephone Encounter (Signed)
Noted  

## 2019-02-27 NOTE — Telephone Encounter (Signed)
Mrs Lichtenwalner (DPR signed) said pt was seen 01/17/19 with dizziness. requip and gabapentin dosages adjusted and that helped for short period; now dizziness on and off again. No H/A,CP,SOB no covid symptoms, has had prod cough with clear phlegm for years. No travel and no known exposure to + covid. Offered multiple appts this wk with other providers since Dr Damita Dunnings out of office this wk. Mrs Soter said pt will only see Dr Damita Dunnings. Advised if pt changes his mind to call [facility] for earlier appt or go to UC or ED. ED precautions given and Mrs Salvia voiced understanding. Scheduled 30'appt with Dr Damita Dunnings 03/04/19 at 2:45. FYI to Dr Damita Dunnings and Dr Darnell Level.

## 2019-03-03 NOTE — Telephone Encounter (Signed)
Noted. Thanks. Will see then.  

## 2019-03-04 ENCOUNTER — Other Ambulatory Visit: Payer: Self-pay

## 2019-03-04 ENCOUNTER — Ambulatory Visit (INDEPENDENT_AMBULATORY_CARE_PROVIDER_SITE_OTHER): Payer: PPO | Admitting: Family Medicine

## 2019-03-04 ENCOUNTER — Encounter: Payer: Self-pay | Admitting: Family Medicine

## 2019-03-04 DIAGNOSIS — R2681 Unsteadiness on feet: Secondary | ICD-10-CM | POA: Diagnosis not present

## 2019-03-04 MED ORDER — GABAPENTIN 300 MG PO CAPS: ORAL_CAPSULE | ORAL | Status: DC

## 2019-03-04 NOTE — Patient Instructions (Signed)
Stop the gabapentin for now.  If you don't get better in the next few days, then cut the hydrochlorothiazide in half, down to 12.5mg  a day.  Update me as needed.  You may have more swelling with the lower dose of hydrochlorothiazide.  Take care.  Glad to see you.

## 2019-03-04 NOTE — Progress Notes (Signed)
Unsteadiness.  Previous note reviewed with patient.  After the last visit, he cut back on gabapentin and requip.  His symptoms got some better in the meantime.  He felt like he was doing better for a while but then he had return of some of the previous symptoms in the meantime.  In the meantime, his legs are not jumpy at night.  No acute weakness.  No falls.  No focal changes.  He still taking tramadol and that is still helping with joint pain.  He does not seem to have any adverse effect on that.  We talked about his symptoms being either vertigo vs lightheadedness.  He may actually have both.  No syncope in the last few years.    Meds, vitals, and allergies reviewed.   ROS: Per HPI unless specifically indicated in ROS section   GEN: nad, alert and oriented HEENT: ncat NECK: supple w/o LA CV: rrr. PULM: ctab, no inc wob ABD: soft, +bs EXT: trace BLE edema SKIN: no acute rash, skin well perfused. Not lightheaded on standing. No focal weakness in the extremities.

## 2019-03-06 NOTE — Assessment & Plan Note (Addendum)
Discussed options.  I suspect some of his symptoms are medication related.  I want to make one change at a time to limit variables.  He agrees.  At this point still okay for outpatient follow-up.  Advised patient of the following- Stop the gabapentin for now.  If not better in the next few days, then cut the hydrochlorothiazide in half, down to 12.5mg  a day.  Update me as needed.  He may have more swelling with the lower dose of hydrochlorothiazide, discussed with patient. He agrees to plan >25 minutes spent in face to face time with patient, >50% spent in counselling or coordination of care.

## 2019-03-06 NOTE — Progress Notes (Signed)
Carelink Summary Report / Loop Recorder 

## 2019-03-13 ENCOUNTER — Telehealth: Payer: Self-pay

## 2019-03-13 MED ORDER — HYDROCHLOROTHIAZIDE 25 MG PO TABS: 12.5000 mg | ORAL_TABLET | Freq: Every day | ORAL | Status: DC

## 2019-03-13 NOTE — Telephone Encounter (Signed)
Noted.  That is great news.  Many thanks for the update.  I am glad he is feeling better.  Would continue as is.  Please update me as needed.  Thanks.  Med and allergy list updated.

## 2019-03-13 NOTE — Telephone Encounter (Signed)
[name] (DPR signed) said pt seen on 03/04/19 and Dr Damita Dunnings stopped the gabapentin and HCTZ was cut in half. This seems to have worked; pt has not experienced dizziness in 3-4 days. FYI to Dr Damita Dunnings.

## 2019-03-14 DIAGNOSIS — N411 Chronic prostatitis: Secondary | ICD-10-CM | POA: Diagnosis not present

## 2019-03-14 DIAGNOSIS — R350 Frequency of micturition: Secondary | ICD-10-CM | POA: Diagnosis not present

## 2019-03-14 NOTE — Telephone Encounter (Signed)
Patient advised.

## 2019-03-25 ENCOUNTER — Ambulatory Visit: Payer: PPO | Admitting: Family Medicine

## 2019-03-25 ENCOUNTER — Encounter: Payer: Self-pay | Admitting: Family Medicine

## 2019-03-25 ENCOUNTER — Other Ambulatory Visit: Payer: Self-pay

## 2019-03-25 ENCOUNTER — Ambulatory Visit (INDEPENDENT_AMBULATORY_CARE_PROVIDER_SITE_OTHER)
Admission: RE | Admit: 2019-03-25 | Discharge: 2019-03-25 | Disposition: A | Discharge status: Home / Self Care | Payer: PPO | Source: Ambulatory Visit | Attending: Family Medicine | Admitting: Family Medicine

## 2019-03-25 ENCOUNTER — Ambulatory Visit (INDEPENDENT_AMBULATORY_CARE_PROVIDER_SITE_OTHER): Payer: PPO | Admitting: Family Medicine

## 2019-03-25 VITALS — BP 130/78 | HR 71 | Temp 98.1°F | Ht 65.5 in | Wt 156.2 lb

## 2019-03-25 DIAGNOSIS — K5900 Constipation, unspecified: Secondary | ICD-10-CM | POA: Diagnosis not present

## 2019-03-25 DIAGNOSIS — R1030 Lower abdominal pain, unspecified: Secondary | ICD-10-CM

## 2019-03-25 LAB — POC URINALSYSI DIPSTICK (AUTOMATED)
Bilirubin, UA: NEGATIVE
Blood, UA: NEGATIVE
Glucose, UA: NEGATIVE
Ketones, UA: NEGATIVE
Leukocytes, UA: NEGATIVE
Nitrite, UA: NEGATIVE
Protein, UA: NEGATIVE
Spec Grav, UA: 1.02 (ref 1.010–1.025)
Urobilinogen, UA: 0.2 E.U./dL
pH, UA: 6 (ref 5.0–8.0)

## 2019-03-25 MED ORDER — HYDROCHLOROTHIAZIDE 12.5 MG PO TABS: 12.5000 mg | ORAL_TABLET | Freq: Every day | ORAL | 1 refills | Status: DC

## 2019-03-25 NOTE — Patient Instructions (Addendum)
Labs, urine, xray today.  Xray showing significant amount of stool.  I recommend bland diet for 2-3 days (bananas, rice, applesauce, toast, jello, chicken broth, ginger ale. Let us know if not improving with this.  We will be in touch with results.

## 2019-03-25 NOTE — Progress Notes (Signed)
This visit was conducted in person.  BP 130/78 (BP Location: Left Arm, Patient Position: Sitting, Cuff Size: Normal)   Pulse 71   Temp 98.1 F (36.7 C) (Temporal)   Ht 5' 5.5" (1.664 m)   Wt 156 lb 3 oz (70.8 kg)   SpO2 96%   BMI 25.60 kg/m    CC: abd pain Subjective:    Patient ID: [name], male    DOB: 09/05/1925, 85 y.o.   MRN: [id number]  HPI: [name] is a 85 y.o. male presenting on 03/25/2019 for Abdominal Pain (C/o abd pain in umbilical region radiating around to the back.  Pain started 03/22/19.  H/o diverticulitis.  Pt accompanied by wife, [name]. ) and Discuss Medication (Wants to discuss HCTZ. )   3d h/o lower abdominal pain "worried it's diverticulitis" after eating some tomatoes. Feels better when laying down. Normal bowel movements. No fevers/chills, diarrhea/constipation, vomiting or nausea. No gassiness/bloating, indigestion, heartburn, dysuria. No blood in stool or urine. Last BM this morning, normal - has 1-2/day.   No new foods.  No sick contacts at home.   Requests 12.5mg  hctz - as he's been cutting 25mg  dose in half and they crumble.   Regularly taking pepcid, pantoprazole, and carafate.   Known h/o diverticulitis, barrett's esophagus, GERD with PUD, GI AVM.  H/o stroke.  S/p appendectomy 2015, inguinal hernia repair 2001.      Relevant past medical, surgical, family and social history reviewed and updated as indicated. Interim medical history since our last visit reviewed. Allergies and medications reviewed and updated. Outpatient Medications Prior to Visit  Medication Sig Dispense Refill  . AFLIBERCEPT IO Inject 1 mg into the eye. Right eye every 4 weeks    . Alirocumab (PRALUENT) 150 MG/ML SOAJ Inject 150 mg into the skin every 14 (fourteen) days. 6 pen 3  . Bevacizumab (AVASTIN IV) Place 1 Applicatorful into both eyes See admin instructions. Left eye every 8 weeks    . cetirizine (ZYRTEC) 10 MG tablet Take 10 mg by mouth daily as needed for  allergies.     [name] Kitchen. clopidogrel (PLAVIX) 75 MG tablet TAKE 1 TABLET BY MOUTH EVERY DAY 90 tablet 3  . diltiazem (CARDIZEM CD) 180 MG 24 hr capsule TAKE ONE (1) CAPSULE BY MOUTH EACH DAY 90 capsule 3  . famotidine (PEPCID) 20 MG tablet Take 1 tablet (20 mg total) by mouth 2 (two) times daily as needed for heartburn or indigestion. 180 tablet 3  . ferrous sulfate 325 (65 FE) MG tablet Take 1 tablet (325 mg total) by mouth daily with breakfast.    . fluticasone (FLONASE) 50 MCG/ACT nasal spray Place 2 sprays into both nostrils daily as needed for allergies. 16 g 5  . gemfibrozil (LOPID) 600 MG tablet TAKE 1 TABLET BY MOUTH TWICE A DAY 180 tablet 3  . guaiFENesin (ROBITUSSIN) 100 MG/5ML SOLN Take 5 mLs (100 mg total) by mouth every 6 (six) hours as needed for cough.    . Hypromellose (ARTIFICIAL TEARS OP) Apply 1 drop to eye daily as needed (itchy / watery eyes).    . meclizine (ANTIVERT) 25 MG tablet Take 0.5 tablets (12.5 mg total) by mouth 3 (three) times daily as needed for dizziness.    . Multiple Vitamins-Minerals (OCUVITE ADULT 50+ PO) Take 1 capsule by mouth daily.      . nitroGLYCERIN (NITROSTAT) 0.4 MG SL tablet Place 1 tablet (0.4 mg total) under the tongue every 5 (five) minutes as needed for  chest pain (max 3 doses in 15 minutes). 25 tablet 0  . pantoprazole (PROTONIX) 40 MG tablet TAKE 1 TABLET BY MOUTH EVERY DAY IN THE MORNING 90 tablet 3  . rOPINIRole (REQUIP) 1 MG tablet Take 0.5 tablets (0.5 mg total) by mouth at bedtime.    . sucralfate (CARAFATE) 1 g tablet Take 1 g by mouth daily.     . tamsulosin (FLOMAX) 0.4 MG CAPS capsule TAKE 1 CAPSULE BY MOUTH EVERY DAY 90 capsule 3  . traMADol (ULTRAM) 50 MG tablet TAKE ONE TABLET BY MOUTH EVERY EIGHT HOURS AS NEEDED FOR MODERATE TO SEVERE PAIN 60 tablet 2  . vitamin B-12 (CYANOCOBALAMIN) 1000 MCG tablet Take 2 tablets (2,000 mcg total) by mouth daily.    . hydrochlorothiazide (HYDRODIURIL) 25 MG tablet Take 0.5 tablets (12.5 mg total) by mouth  daily.     No facility-administered medications prior to visit.      Per HPI unless specifically indicated in ROS section below Review of Systems Objective:    BP 130/78 (BP Location: Left Arm, Patient Position: Sitting, Cuff Size: Normal)   Pulse 71   Temp 98.1 F (36.7 C) (Temporal)   Ht 5' 5.5" (1.664 m)   Wt 156 lb 3 oz (70.8 kg)   SpO2 96%   BMI 25.60 kg/m   Wt Readings from Last 3 Encounters:  03/25/19 156 lb 3 oz (70.8 kg)  03/04/19 157 lb (71.2 kg)  01/30/19 158 lb 8 oz (71.9 kg)    Physical Exam Vitals signs and nursing note reviewed.  Constitutional:      General: He is not in acute distress.    Appearance: Normal appearance. He is not ill-appearing.  HENT:     Mouth/Throat:     Mouth: Mucous membranes are moist.     Pharynx: No posterior oropharyngeal erythema.  Cardiovascular:     Rate and Rhythm: Normal rate and regular rhythm.     Pulses: Normal pulses.     Heart sounds: Normal heart sounds. No murmur.  Pulmonary:     Effort: Pulmonary effort is normal. No respiratory distress.     Breath sounds: Normal breath sounds. No wheezing, rhonchi or rales.  Abdominal:     General: Abdomen is flat. Bowel sounds are normal. There is no distension.     Palpations: Abdomen is soft. There is no mass.     Tenderness: There is abdominal tenderness (mild) in the epigastric area and suprapubic area. There is no right CVA tenderness, left CVA tenderness, guarding or rebound. Negative signs include Murphy's sign.     Hernia: No hernia is present. There is no hernia in the umbilical area.  Neurological:     Mental Status: He is alert.       Results for orders placed or performed in visit on 03/25/19  POCT Urinalysis Dipstick (Automated)  Result Value Ref Range   Color, UA light yellow    Clarity, UA clear    Glucose, UA Negative Negative   Bilirubin, UA negative    Ketones, UA negative    Spec Grav, UA 1.020 1.010 - 1.025   Blood, UA negative    pH, UA 6.0 5.0 -  8.0   Protein, UA Negative Negative   Urobilinogen, UA 0.2 0.2 or 1.0 E.U./dL   Nitrite, UA negative    Leukocytes, UA Negative Negative   Lab Results  Component Value Date   WBC 7.0 01/30/2019   HGB 13.8 01/30/2019   HCT 41.1 01/30/2019  MCV 89.8 01/30/2019   PLT 255.0 01/30/2019    Lab Results  Component Value Date   CREATININE 1.11 01/30/2019   BUN 19 01/30/2019   NA 139 01/30/2019   K 3.4 (L) 01/30/2019   CL 104 01/30/2019   CO2 27 01/30/2019    Lab Results  Component Value Date   LIPASE 19.0 01/30/2019   DG Abd 2 Views CLINICAL DATA:  Lower abdominal pain  EXAM: ABDOMEN - 2 VIEW  COMPARISON:  None.  FINDINGS: Scattered large and small bowel gas is noted. Mild retained fecal material is noted within the colon consistent with mild constipation. No free air is seen. No abnormal mass or abnormal calcifications are noted. No bony abnormality is seen.  IMPRESSION: Mild constipation.  Electronically Signed   By: Inez Catalina M.D.   On: 03/25/2019 20:15   Assessment & Plan:   Problem List Items Addressed This Visit    Lower abdominal pain - Primary    Had lower abd pain 01/2019 that improved on augmentin, in h/o diverticulitis. However today not having any bowel changes, fever, etc. Recommend bland low residue diet for next 1-2 days and advanced diet as tolerated. Check CBC, CMP, lipase and UA, 2 view abd xrays for further evaluation. Await labs prior to restarting abx at this time. Update if worsening symptoms.       Relevant Orders   Comprehensive metabolic panel   CBC with Differential/Platelet   Lipase   DG Abd 2 Views (Completed)   POCT Urinalysis Dipstick (Automated) (Completed)       Meds ordered this encounter  Medications  . hydrochlorothiazide (HYDRODIURIL) 12.5 MG tablet    Sig: Take 1 tablet (12.5 mg total) by mouth daily.    Dispense:  90 tablet    Refill:  1    Note new dose   Orders Placed This Encounter  Procedures  . DG Abd 2  Views    Standing Status:   Future    Number of Occurrences:   1    Standing Expiration Date:   05/24/2020    Order Specific Question:   Reason for Exam (SYMPTOM  OR DIAGNOSIS REQUIRED)    Answer:   lower abd pain    Order Specific Question:   Preferred imaging location?    Answer:   [facility]    Order Specific Question:   Radiology Contrast Protocol - do NOT remove file path    Answer:   [email or web address]  . Comprehensive metabolic panel  . CBC with Differential/Platelet  . Lipase  . POCT Urinalysis Dipstick (Automated)    Patient Instructions  Labs, urine, xray today.  Xray showing significant amount of stool.  I recommend bland diet for 2-3 days (bananas, rice, applesauce, toast, jello, chicken broth, ginger ale. Let us know if not improving with this.  We will be in touch with results.    Follow up plan: No follow-ups on file.  Ria Bush, MD

## 2019-03-25 NOTE — Assessment & Plan Note (Signed)
Had lower abd pain 01/2019 that improved on augmentin, in h/o diverticulitis. However today not having any bowel changes, fever, etc. Recommend bland low residue diet for next 1-2 days and advanced diet as tolerated. Check CBC, CMP, lipase and UA, 2 view abd xrays for further evaluation. Await labs prior to restarting abx at this time. Update if worsening symptoms.

## 2019-03-26 LAB — COMPREHENSIVE METABOLIC PANEL
ALT: 8 U/L (ref 0–53)
AST: 15 U/L (ref 0–37)
Albumin: 4.2 g/dL (ref 3.5–5.2)
Alkaline Phosphatase: 97 U/L (ref 39–117)
BUN: 18 mg/dL (ref 6–23)
CO2: 27 mEq/L (ref 19–32)
Calcium: 9.4 mg/dL (ref 8.4–10.5)
Chloride: 101 mEq/L (ref 96–112)
Creatinine, Ser: 1.12 mg/dL (ref 0.40–1.50)
GFR: 61.4 mL/min (ref >60.00)
Glucose, Bld: 87 mg/dL (ref 70–99)
Potassium: 3.8 mEq/L (ref 3.5–5.1)
Sodium: 139 mEq/L (ref 135–145)
Total Bilirubin: 0.4 mg/dL (ref 0.2–1.2)
Total Protein: 7.2 g/dL (ref 6.0–8.3)

## 2019-03-26 LAB — CBC WITH DIFFERENTIAL/PLATELET
Basophils Absolute: 0.1 10*3/uL (ref 0.0–0.1)
Basophils Relative: 1.1 % (ref 0.0–3.0)
Eosinophils Absolute: 0.3 10*3/uL (ref 0.0–0.7)
Eosinophils Relative: 4.2 % (ref 0.0–5.0)
HCT: 43 % (ref 39.0–52.0)
Hemoglobin: 14.3 g/dL (ref 13.0–17.0)
Lymphocytes Relative: 28.2 % (ref 12.0–46.0)
Lymphs Abs: 2.1 10*3/uL (ref 0.7–4.0)
MCHC: 33.3 g/dL (ref 30.0–36.0)
MCV: 90.1 fl (ref 78.0–100.0)
Monocytes Absolute: 0.8 10*3/uL (ref 0.1–1.0)
Monocytes Relative: 11 % (ref 3.0–12.0)
Neutro Abs: 4.1 10*3/uL (ref 1.4–7.7)
Neutrophils Relative %: 55.5 % (ref 43.0–77.0)
Platelets: 289 10*3/uL (ref 150.0–400.0)
RBC: 4.77 Mil/uL (ref 4.22–5.81)
RDW: 13.4 % (ref 11.5–15.5)
WBC: 7.3 10*3/uL (ref 4.0–10.5)

## 2019-03-26 LAB — LIPASE: Lipase: 28 U/L (ref 11.0–59.0)

## 2019-03-28 DIAGNOSIS — H35423 Microcystoid degeneration of retina, bilateral: Secondary | ICD-10-CM | POA: Diagnosis not present

## 2019-03-28 DIAGNOSIS — H353231 Exudative age-related macular degeneration, bilateral, with active choroidal neovascularization: Secondary | ICD-10-CM | POA: Diagnosis not present

## 2019-03-28 DIAGNOSIS — H43813 Vitreous degeneration, bilateral: Secondary | ICD-10-CM | POA: Diagnosis not present

## 2019-03-28 DIAGNOSIS — H353211 Exudative age-related macular degeneration, right eye, with active choroidal neovascularization: Secondary | ICD-10-CM | POA: Diagnosis not present

## 2019-03-31 LAB — CUP PACEART REMOTE DEVICE CHECK
Date Time Interrogation Session: 20200808183913
Implantable Pulse Generator Implant Date: 20170504

## 2019-04-01 ENCOUNTER — Ambulatory Visit (INDEPENDENT_AMBULATORY_CARE_PROVIDER_SITE_OTHER): Payer: PPO | Admitting: *Deleted

## 2019-04-01 DIAGNOSIS — R002 Palpitations: Secondary | ICD-10-CM

## 2019-04-10 NOTE — Progress Notes (Signed)
Carelink Summary Report / Loop Recorder 

## 2019-04-15 ENCOUNTER — Other Ambulatory Visit: Payer: Self-pay | Admitting: Family Medicine

## 2019-04-17 ENCOUNTER — Other Ambulatory Visit: Payer: Self-pay | Admitting: Family Medicine

## 2019-04-17 NOTE — Telephone Encounter (Signed)
Last time filled on 01/10/2019 #60 with 2 refills. LOV with Dr Darnell Level on 03/25/2019 for acute visit. No future appointments made.

## 2019-04-17 NOTE — Telephone Encounter (Signed)
Sent. Thanks.  Okay to continue. 

## 2019-04-18 DIAGNOSIS — H353221 Exudative age-related macular degeneration, left eye, with active choroidal neovascularization: Secondary | ICD-10-CM | POA: Diagnosis not present

## 2019-04-18 DIAGNOSIS — H353231 Exudative age-related macular degeneration, bilateral, with active choroidal neovascularization: Secondary | ICD-10-CM | POA: Diagnosis not present

## 2019-05-02 ENCOUNTER — Ambulatory Visit (INDEPENDENT_AMBULATORY_CARE_PROVIDER_SITE_OTHER): Payer: PPO | Admitting: *Deleted

## 2019-05-02 DIAGNOSIS — I639 Cerebral infarction, unspecified: Secondary | ICD-10-CM | POA: Diagnosis not present

## 2019-05-02 LAB — CUP PACEART REMOTE DEVICE CHECK
Date Time Interrogation Session: 20200910040500
Implantable Pulse Generator Implant Date: 20170504

## 2019-05-06 ENCOUNTER — Telehealth: Payer: Self-pay | Admitting: Emergency Medicine

## 2019-05-06 NOTE — Telephone Encounter (Signed)
Spoke with wife per DPR. LINQ at RRT. Patient will call back to let office know if he wants device extracted.

## 2019-05-07 NOTE — Telephone Encounter (Signed)
Pt wife states that he do not want his Linq removed unless it starts to bother him.

## 2019-05-07 NOTE — Telephone Encounter (Signed)
Unenrolled from [place], return kit ordered.

## 2019-05-13 NOTE — Progress Notes (Signed)
Carelink Summary Report / Loop Recorder 

## 2019-05-27 DIAGNOSIS — H353231 Exudative age-related macular degeneration, bilateral, with active choroidal neovascularization: Secondary | ICD-10-CM | POA: Diagnosis not present

## 2019-05-27 DIAGNOSIS — H43813 Vitreous degeneration, bilateral: Secondary | ICD-10-CM | POA: Diagnosis not present

## 2019-05-27 DIAGNOSIS — H35423 Microcystoid degeneration of retina, bilateral: Secondary | ICD-10-CM | POA: Diagnosis not present

## 2019-05-27 DIAGNOSIS — H353211 Exudative age-related macular degeneration, right eye, with active choroidal neovascularization: Secondary | ICD-10-CM | POA: Diagnosis not present

## 2019-06-04 ENCOUNTER — Encounter: Payer: PPO | Admitting: *Deleted

## 2019-06-16 ENCOUNTER — Other Ambulatory Visit: Payer: Self-pay | Admitting: Family Medicine

## 2019-06-26 DIAGNOSIS — H353231 Exudative age-related macular degeneration, bilateral, with active choroidal neovascularization: Secondary | ICD-10-CM | POA: Diagnosis not present

## 2019-07-10 ENCOUNTER — Telehealth: Payer: Self-pay

## 2019-07-10 NOTE — Telephone Encounter (Signed)
Mrs Tetrault left v/m requesting cb about pts cholesterol med. Tried to call x 3 and recording that my call did not go thru. No other contact #.

## 2019-07-11 NOTE — Telephone Encounter (Signed)
Wife states that she has received a letter saying that it is time to begin the process for renewal of the patient assistance program for Praluent.  Form has been printed and will be filled out and placed on Dr. Josefine Class desk for his review and then given to the patient to fill out their part and will then be completed.

## 2019-07-12 NOTE — Telephone Encounter (Signed)
I will work on the hardcopy and give it back to you.  Thanks.

## 2019-07-16 NOTE — Telephone Encounter (Signed)
Patient's wife advised that ppw is ready for her part and form left at front desk for pickup.

## 2019-07-24 DIAGNOSIS — H353231 Exudative age-related macular degeneration, bilateral, with active choroidal neovascularization: Secondary | ICD-10-CM | POA: Diagnosis not present

## 2019-07-24 DIAGNOSIS — H353211 Exudative age-related macular degeneration, right eye, with active choroidal neovascularization: Secondary | ICD-10-CM | POA: Diagnosis not present

## 2019-08-08 ENCOUNTER — Telehealth: Payer: Self-pay | Admitting: Family Medicine

## 2019-08-08 NOTE — Telephone Encounter (Signed)
[employer] - calling for PA clinical information.  Did not mention the name of the medication or any other information.  Ref # [id number] Call back [phone] opt 3

## 2019-08-08 NOTE — Telephone Encounter (Signed)
Spoke with pharmacist, PA completed over the phone.

## 2019-08-13 ENCOUNTER — Other Ambulatory Visit: Payer: Self-pay | Admitting: Family Medicine

## 2019-08-19 ENCOUNTER — Encounter: Payer: Self-pay | Admitting: Family Medicine

## 2019-08-19 ENCOUNTER — Other Ambulatory Visit: Payer: Self-pay

## 2019-08-19 ENCOUNTER — Other Ambulatory Visit: Payer: Self-pay | Admitting: Family Medicine

## 2019-08-19 ENCOUNTER — Ambulatory Visit (INDEPENDENT_AMBULATORY_CARE_PROVIDER_SITE_OTHER): Payer: PPO | Admitting: Family Medicine

## 2019-08-19 VITALS — BP 122/70 | HR 81 | Temp 97.1°F | Ht 65.5 in | Wt 155.1 lb

## 2019-08-19 DIAGNOSIS — R300 Dysuria: Secondary | ICD-10-CM | POA: Diagnosis not present

## 2019-08-19 DIAGNOSIS — N50819 Testicular pain, unspecified: Secondary | ICD-10-CM

## 2019-08-19 LAB — POC URINALSYSI DIPSTICK (AUTOMATED)
Bilirubin, UA: NEGATIVE
Glucose, UA: NEGATIVE
Ketones, UA: NEGATIVE
Leukocytes, UA: NEGATIVE
Nitrite, UA: NEGATIVE
Protein, UA: NEGATIVE
Spec Grav, UA: 1.025 (ref 1.010–1.025)
Urobilinogen, UA: 0.2 E.U./dL
pH, UA: 6 (ref 5.0–8.0)

## 2019-08-19 MED ORDER — TRAMADOL HCL 50 MG PO TABS: ORAL_TABLET | ORAL | 3 refills | Status: DC

## 2019-08-19 MED ORDER — SULFAMETHOXAZOLE-TRIMETHOPRIM 800-160 MG PO TABS: 1.0000 | ORAL_TABLET | Freq: Two times a day (BID) | ORAL | 0 refills | Status: DC

## 2019-08-19 NOTE — Progress Notes (Signed)
This visit occurred during the SARS-CoV-2 public health emergency.  Safety protocols were in place, including screening questions prior to the visit, additional usage of staff PPE, and extensive cleaning of exam room while observing appropriate contact time as indicated for disinfecting solutions.  Sx going on for a few days.  Some intermittent dysuria, change in urine stream.  Change in urine odor.  Recently with some aches.  No fevers but had some chills.  No testicle pain now but prev with some pain.  No changes in BMs.  No vomiting, no abd pain now but prev with some lower abd pain.  No blood in stools.  No gross hematuria.    He needed a refill on tramadol, used prn at baseline.  No ADE on med.   Meds, vitals, and allergies reviewed.   ROS: Per HPI unless specifically indicated in ROS section   GEN: nad, alert and oriented HEENT: ncat NECK: supple w/o LA CV: rrr. PULM: ctab, no inc wob ABD: soft, +bs, not ttp.   Testicles and groin not tender to palpation.  No discharge. EXT: no edema SKIN: no acute rash

## 2019-08-19 NOTE — Patient Instructions (Signed)
Start septra.  Update me if not better in the next few days or if worse.  We'll contact you with your lab report. Drink plenty of water in the meantime.  Take care.  Glad to see you.

## 2019-08-21 ENCOUNTER — Other Ambulatory Visit: Payer: Self-pay | Admitting: Family Medicine

## 2019-08-21 DIAGNOSIS — R300 Dysuria: Secondary | ICD-10-CM

## 2019-08-21 LAB — URINE CULTURE
MICRO NUMBER:: 1232868
Result:: NO GROWTH
SPECIMEN QUALITY:: ADEQUATE

## 2019-08-21 NOTE — Assessment & Plan Note (Signed)
Presumed cystitis.  Still okay for outpatient follow-up.  Discussed options.  Check urine culture.  Start Septra.  Drink liberal oral fluids.  Update me as needed.  He agrees.  Routine cautions given to patient.

## 2019-08-22 ENCOUNTER — Other Ambulatory Visit: Payer: Self-pay | Admitting: Family Medicine

## 2019-08-22 DIAGNOSIS — R300 Dysuria: Secondary | ICD-10-CM

## 2019-08-26 ENCOUNTER — Telehealth: Payer: Self-pay | Admitting: *Deleted

## 2019-08-26 NOTE — Telephone Encounter (Signed)
Patient's wife left a voicemail wanting to know when he is suppose to come back to give another specimen? Mrs. Romack stated that she forgot to write it down.

## 2019-08-26 NOTE — Telephone Encounter (Signed)
Wife advised a week after symptoms resolved.

## 2019-08-29 ENCOUNTER — Other Ambulatory Visit: Payer: Self-pay

## 2019-08-29 ENCOUNTER — Ambulatory Visit (INDEPENDENT_AMBULATORY_CARE_PROVIDER_SITE_OTHER): Payer: PPO | Admitting: Family Medicine

## 2019-08-29 DIAGNOSIS — R1110 Vomiting, unspecified: Secondary | ICD-10-CM | POA: Diagnosis not present

## 2019-08-29 NOTE — Progress Notes (Signed)
Interactive audio and video telecommunications were attempted between this provider and patient, however failed, due to patient having technical difficulties OR patient did not have access to video capability.  We continued and completed visit with audio only.   Virtual Visit via Telephone Note  I connected with patient on 08/29/19  at 1:05 PM  by telephone and verified that I am speaking with the correct person using two identifiers.  Location of patient: home  Location of MD: [facility] Name of referring provider (if blank then none associated): Names per persons and role in encounter:  MD: Ferd Hibbs, Patient: name listed above.    I discussed the limitations, risks, security and privacy concerns of performing an evaluation and management service by telephone and the availability of in person appointments. I also discussed with the patient that there may be a patient responsible charge related to this service. The patient expressed understanding and agreed to proceed.  CC: vomiting.   History of Present Illness:  Prev dysuria is resolved, other than some nocturia at baseline.    Recent repeated vomiting for about 12 hours.  Diffuse aches concurrently.  All of this started about 2 days ago, in the PM on 08/26/2018.  He felt better last night and this AM.  Dec in appetite persists but he feels better overall.   "I slept better last night."  Most recently ate a biscuit this AM, about 1 hour ago, was able to keep it down, without abd pain.  He had some diarrhea this AM, once.  No blood in stool.  Isn't lightheaded.  Still with UOP.  No fevers.  He is drinking fluids well.   His wife isn't sick, she feels well.  Granddaughter recently had similar sx.     Observations/Objective: nad Speech wnl  Assessment and Plan:  Likely AGE, better in the meantime. Vomiting resolved.  Discussed routine cautions and maintaining hydration.  Diarrhea should taper off. He'll update me as needed.  Still  okay for outpatient f/u.   He agrees with plan.   Follow Up Instructions: see above.    I discussed the assessment and treatment plan with the patient. The patient was provided an opportunity to ask questions and all were answered. The patient agreed with the plan and demonstrated an understanding of the instructions.   The patient was advised to call back or seek an in-person evaluation if the symptoms worsen or if the condition fails to improve as anticipated.  I provided 12 minutes of non-face-to-face time during this encounter.  [name], MD

## 2019-08-29 NOTE — Assessment & Plan Note (Signed)
Likely AGE, better in the meantime. Vomiting resolved.  Discussed routine cautions and maintaining hydration.  Diarrhea should taper off. He'll update me as needed.  Still okay for outpatient f/u.   He agrees with plan.

## 2019-09-04 DIAGNOSIS — H353231 Exudative age-related macular degeneration, bilateral, with active choroidal neovascularization: Secondary | ICD-10-CM | POA: Diagnosis not present

## 2019-09-17 ENCOUNTER — Other Ambulatory Visit: Payer: Self-pay | Admitting: Family Medicine

## 2019-09-18 ENCOUNTER — Telehealth: Payer: Self-pay

## 2019-09-18 NOTE — Telephone Encounter (Signed)
[facility] Night - Client TELEPHONE ADVICE RECORD AccessNurse Patient Name: [name] Gender: Male DOB: 1926/04/05 Age: 86 Y 10 M 30 D Return Phone Number: Address: City/State/Zip: [place] Client [facility] Night - Client Client Site [facility] - Night Physician Raechel Ache - MD Contact Type Call Who Is Calling Pharmacy Call Type Pharmacy Send to RN Chief Complaint Paging or Request for Consult Reason for Call Request to change medication order Initial Comment Caller states that he is needing a date for for praluent updated. Additional Comment Pharmacy Name [facility] Pharmacist Name [facility] Pharmacy Number [phone] Translation No Nurse Assessment Nurse: Saddie Benders, RN, Claudette Date/Time Lamount Cohen Time): 09/17/2019 6:33:01 PM Please select the assessment type ---Pharmacy clarification Additional Documentation ---Sarah in pharmacy state Praluent prescription needs to be dated and a Prior Authorization Approval letter is needed for prescription. Instructed Sarah to call office during regular business hours to which she agreed. Is there an on-call physician for the client? ---Yes Do the client directives allow paging the on call for medication concerns? ---Yes Document information that requires clarification. ---Date for prescription Prior Authorization Approval Letter Guidelines Guideline Title Affirmed Question Affirmed Notes Nurse Date/Time (Eastern Time) Disp. Time Lamount Cohen Time) Disposition Final User 09/17/2019 6:11:56 PM Attempt made - no message left Gibson Ramp, Claudette 09/17/2019 6:35:23 PM Clinical Call Yes Saddie Benders, RN, Claudette Comments User: Burley Saver, RN Date/Time Lamount Cohen Time): 09/17/2019 6:12:14 PM Pharmacy disconnected the call.

## 2019-10-09 DIAGNOSIS — H353211 Exudative age-related macular degeneration, right eye, with active choroidal neovascularization: Secondary | ICD-10-CM | POA: Diagnosis not present

## 2019-10-09 DIAGNOSIS — H353231 Exudative age-related macular degeneration, bilateral, with active choroidal neovascularization: Secondary | ICD-10-CM | POA: Diagnosis not present

## 2019-10-22 ENCOUNTER — Other Ambulatory Visit: Payer: Self-pay | Admitting: Family Medicine

## 2019-10-23 NOTE — Telephone Encounter (Signed)
Sent. Thanks.   

## 2019-11-08 ENCOUNTER — Other Ambulatory Visit: Payer: Self-pay | Admitting: Family Medicine

## 2019-11-11 DIAGNOSIS — H35423 Microcystoid degeneration of retina, bilateral: Secondary | ICD-10-CM | POA: Diagnosis not present

## 2019-11-11 DIAGNOSIS — H43813 Vitreous degeneration, bilateral: Secondary | ICD-10-CM | POA: Diagnosis not present

## 2019-11-11 DIAGNOSIS — H353231 Exudative age-related macular degeneration, bilateral, with active choroidal neovascularization: Secondary | ICD-10-CM | POA: Diagnosis not present

## 2019-11-16 ENCOUNTER — Ambulatory Visit: Payer: PPO | Attending: Internal Medicine

## 2019-11-16 DIAGNOSIS — Z23 Encounter for immunization: Secondary | ICD-10-CM

## 2019-11-16 NOTE — Progress Notes (Signed)
   Covid-19 Vaccination Clinic  Name:  [name]    MRN: [id number] DOB: 20-Mar-1926  11/16/2019  [name] was observed post Covid-19 immunization for 15 minutes without incident. He was provided with Vaccine Information Sheet and instruction to access the V-Safe system.   [name] was instructed to call 911 with any severe reactions post vaccine: [name] Kitchen Difficulty breathing  . Swelling of face and throat  . A fast heartbeat  . A bad rash all over body  . Dizziness and weakness   Immunizations Administered    Name Date Dose VIS Date Route   Pfizer COVID-19 Vaccine 11/16/2019 10:18 AM 0.3 mL 08/02/2019 Intramuscular   Manufacturer: [employer], [employer]   Lot: [id number]   NDC: [address]-3

## 2019-11-28 ENCOUNTER — Telehealth: Payer: Self-pay | Admitting: Family Medicine

## 2019-11-28 NOTE — Telephone Encounter (Signed)
Called patient to get scheduled for CPE but line was busy both times. Patient still needs to get scheduled for CPE.

## 2019-11-29 ENCOUNTER — Other Ambulatory Visit: Payer: Self-pay

## 2019-11-29 ENCOUNTER — Ambulatory Visit (INDEPENDENT_AMBULATORY_CARE_PROVIDER_SITE_OTHER): Payer: PPO

## 2019-11-29 ENCOUNTER — Telehealth: Payer: Self-pay

## 2019-11-29 VITALS — Wt 156.0 lb

## 2019-11-29 DIAGNOSIS — E538 Deficiency of other specified B group vitamins: Secondary | ICD-10-CM

## 2019-11-29 DIAGNOSIS — Z0000 Encounter for general adult medical examination without abnormal findings: Secondary | ICD-10-CM | POA: Diagnosis not present

## 2019-11-29 DIAGNOSIS — E611 Iron deficiency: Secondary | ICD-10-CM

## 2019-11-29 DIAGNOSIS — E782 Mixed hyperlipidemia: Secondary | ICD-10-CM

## 2019-11-29 NOTE — Progress Notes (Signed)
Subjective:   [name] is a 86 y.o. male who presents for Medicare Annual/Subsequent preventive examination.  Review of Systems: N/A   This visit is being conducted through telemedicine via telephone at the nurse health advisor's home address due to the COVID-19 pandemic. This patient has given me verbal consent via doximity to conduct this visit, patient states they are participating from their home address. Patient and myself are on the telephone call. There is no referral for this visit. Some vital signs may be absent or patient reported.    Patient identification: identified by name, DOB, and current address   Cardiac Risk Factors include: advanced age (>72men, >31 women);hypertension;male gender;dyslipidemia     Objective:    Vitals: Wt 156 lb (70.8 kg)   BMI 25.56 kg/m   Body mass index is 25.56 kg/m.  Advanced Directives 11/29/2019 05/15/2018 10/10/2017 11/29/2016 05/23/2016 02/22/2016 11/16/2015  Does Patient Have a Medical Advance Directive? Yes No Yes No Yes No No  Type of Estate agent of [place];Living will - Healthcare Power of [place];Living will - Living will - -  Does patient want to make changes to medical advance directive? - - - - No - Patient declined - -  Copy of Healthcare Power of Attorney in Chart? No - copy requested - No - copy requested - No - copy requested - -  Would patient like information on creating a medical advance directive? - No - Patient declined - - - - No - patient declined information    Tobacco Social History   Tobacco Use  Smoking Status Former Smoker  . Packs/day: 0.00  . Years: 36.00  . Pack years: 0.00  . Types: Cigarettes  Smokeless Tobacco Never Used  Tobacco Comment   Quit in 1983     Counseling given: Not Answered Comment: Quit in 1983   Clinical Intake:  Pre-visit preparation completed: Yes  Pain : 0-10 Pain Score: 5  Pain Type: Chronic pain Pain Location: Back Pain Orientation: Lower Pain  Descriptors / Indicators: Aching Pain Onset: More than a month ago Pain Frequency: Intermittent     Nutritional Risks: None Diabetes: No  How often do you need to have someone help you when you read instructions, pamphlets, or other written materials from your doctor or pharmacy?: 1 - Never What is the last grade level you completed in school?: 4th  Interpreter Needed?: No  Information entered by :: CJohnson, LPN  Past Medical History:  Diagnosis Date  . Back pain    improved with gabapentin as of 2015  . Barrett esophagus 09/2003  . Coronary artery calcification seen on CT scan   . Diverticulitis   . Diverticulosis   . GERD (gastroesophageal reflux disease)   . GI AVM (gastrointestinal arteriovenous vascular malformation)   . Hearing loss   . Hemorrhoids   . History of Holter monitoring    a. holter 9/17: NSR, PVCs; no sustained arrhythmia; PVC burden 8%  . History of nuclear stress test    a. Myoview 8/17: Intermediate risk, inferior ischemia, not gated  . History of prostatitis   . History of stroke   . Hyperlipidemia   . Hypertension   . Macular degeneration, wet (HCC)    receiving intra-ocular injections ([place])  . PUD (peptic ulcer disease)    Past Surgical History:  Procedure Laterality Date  . EP IMPLANTABLE DEVICE N/A 12/24/2015   Procedure: Loop Recorder Insertion;  Surgeon: Hillis Range, MD;  Location: [facility] INVASIVE [facility];  Service: Cardiovascular;  Laterality: N/A;  . INGUINAL HERNIA REPAIR  12/30/99  . LAPAROSCOPIC APPENDECTOMY N/A 07/07/2014   Procedure: APPENDECTOMY LAPAROSCOPIC;  Surgeon: Donnie Mesa, MD;  Location: [facility];  Service: General;  Laterality: N/A;  . TEE WITHOUT CARDIOVERSION N/A 10/28/2015   Procedure: TRANSESOPHAGEAL ECHOCARDIOGRAM (TEE);  Surgeon: Larey Dresser, MD;  Location: [facility] ENDOSCOPY;  Service: Cardiovascular;  Laterality: N/A;   Family History  Problem Relation Age of Onset  . Heart attack Father   . Coronary artery disease Father    . Hypertension Mother   . Pancreatic cancer Mother        Died at 72.  . Cancer Mother        Pancreatic cancer  . Arthritis Sister   . Hyperlipidemia Sister   . Prostate cancer Brother   . Cancer Brother        Bone Cancer secondary to Prostate Cancer  . Bleeding Disorder Son   . Diabetes Neg Hx    Social History   Socioeconomic History  . Marital status: Married    Spouse name: [name]  . Number of children: Not on file  . Years of education: Not on file  . Highest education level: Not on file  Occupational History  . Occupation: Retired    Fish farm manager: RETIRED  Tobacco Use  . Smoking status: Former Smoker    Packs/day: 0.00    Years: 36.00    Pack years: 0.00    Types: Cigarettes  . Smokeless tobacco: Never Used  . Tobacco comment: Quit in 12-29-1981  Substance and Sexual Activity  . Alcohol use: No    Alcohol/week: 0.0 standard drinks  . Drug use: No  . Sexual activity: Not on file  Other Topics Concern  . Not on file  Social History Narrative   6th grade. Married Dec 30, 1950 1 son, 1 dtr - died at 33 months. Work - Architectural technologist, Forensic scientist, Social research officer, government.   Retired-fulltime. "[employer]". ACP - yes DNR, yes short-term mechanical ventilarion, no heroic or futile measures in face of irreversible.   Social Determinants of Health   Financial Resource Strain: Low Risk   . Difficulty of Paying Living Expenses: Not hard at all  Food Insecurity: No Food Insecurity  . Worried About Charity fundraiser in the Last Year: Never true  . Ran Out of Food in the Last Year: Never true  Transportation Needs: No Transportation Needs  . Lack of Transportation (Medical): No  . Lack of Transportation (Non-Medical): No  Physical Activity: Inactive  . Days of Exercise per Week: 0 days  . Minutes of Exercise per Session: 0 min  Stress: No Stress Concern Present  . Feeling of Stress : Not at all  Social Connections:   . Frequency of Communication with Friends and Family:   . Frequency of  Social Gatherings with Friends and Family:   . Attends Religious Services:   . Active Member of Clubs or Organizations:   . Attends Archivist Meetings:   [name] Kitchen Marital Status:     Outpatient Encounter Medications as of 11/29/2019  Medication Sig  . AFLIBERCEPT IO Inject 1 mg into the eye. Right eye every 4 weeks  . Bevacizumab (AVASTIN IV) Place 1 Applicatorful into both eyes See admin instructions. Left eye every 8 weeks  . cetirizine (ZYRTEC) 10 MG tablet Take 10 mg by mouth daily as needed for allergies.   [name] Kitchen clopidogrel (PLAVIX) 75 MG tablet TAKE 1 TABLET BY MOUTH EVERY DAY  .  diltiazem (CARDIZEM CD) 180 MG 24 hr capsule TAKE ONE (1) CAPSULE BY MOUTH EACH DAY  . famotidine (PEPCID) 20 MG tablet Take 1 tablet (20 mg total) by mouth 2 (two) times daily as needed for heartburn or indigestion.  . ferrous sulfate 325 (65 FE) MG tablet Take 1 tablet (325 mg total) by mouth daily with breakfast.  . fluticasone (FLONASE) 50 MCG/ACT nasal spray Place 2 sprays into both nostrils daily as needed for allergies.  [name] Kitchen gemfibrozil (LOPID) 600 MG tablet TAKE 1 TABLET BY MOUTH TWICE A DAY  . guaiFENesin (ROBITUSSIN) 100 MG/5ML SOLN Take 5 mLs (100 mg total) by mouth every 6 (six) hours as needed for cough.  . hydrochlorothiazide (HYDRODIURIL) 12.5 MG tablet TAKE 1 TABLET BY MOUTH EVERY DAY  . Hypromellose (ARTIFICIAL TEARS OP) Apply 1 drop to eye daily as needed (itchy / watery eyes).  . meclizine (ANTIVERT) 25 MG tablet Take 0.5 tablets (12.5 mg total) by mouth 3 (three) times daily as needed for dizziness.  . Multiple Vitamins-Minerals (OCUVITE ADULT 50+ PO) Take 1 capsule by mouth daily.    . nitroGLYCERIN (NITROSTAT) 0.4 MG SL tablet Place 1 tablet (0.4 mg total) under the tongue every 5 (five) minutes as needed for chest pain (max 3 doses in 15 minutes).  . pantoprazole (PROTONIX) 40 MG tablet TAKE 1 TABLET BY MOUTH EVERY DAY IN THE MORNING  . PRALUENT 150 MG/ML SOAJ INJECT 150 MG INTO THE SKIN  EVERY 14 (FOURTEEN) DAYS.  [name] Kitchen rOPINIRole (REQUIP) 1 MG tablet Take 0.5 tablets (0.5 mg total) by mouth at bedtime.  . sucralfate (CARAFATE) 1 g tablet Take 1 g by mouth daily.   [name] Kitchen sulfamethoxazole-trimethoprim (BACTRIM DS) 800-160 MG tablet Take 1 tablet by mouth 2 (two) times daily.  . tamsulosin (FLOMAX) 0.4 MG CAPS capsule TAKE 1 CAPSULE BY MOUTH EVERY DAY  . traMADol (ULTRAM) 50 MG tablet TAKE ONE TABLET BY MOUTH EVERY EIGHT HOURS AS NEEDED FOR MODERATE TO SEVERE PAIN  . vitamin B-12 (CYANOCOBALAMIN) 1000 MCG tablet Take 2 tablets (2,000 mcg total) by mouth daily.   No facility-administered encounter medications on file as of 11/29/2019.    Activities of Daily Living In your present state of health, do you have any difficulty performing the following activities: 11/29/2019  Hearing? Y  Comment wears hearing aid in one ear  Vision? Y  Comment has macular degeneration  Difficulty concentrating or making decisions? N  Walking or climbing stairs? N  Dressing or bathing? N  Doing errands, shopping? N  Preparing Food and eating ? N  Using the Toilet? N  In the past six months, have you accidently leaked urine? N  Do you have problems with loss of bowel control? N  Managing your Medications? N  Managing your Finances? N  Housekeeping or managing your Housekeeping? N  Some recent data might be hidden    Patient Care Team: Joaquim Nam, MD as PCP - General (Family Medicine) Corky Crafts, MD as PCP - Cardiology (Cardiology)   Assessment:   This is a routine wellness examination for [name].  Exercise Activities and Dietary recommendations Current Exercise Habits: The patient does not participate in regular exercise at present, Exercise limited by: None identified  Goals    . Follow up with Primary Care Provider     Starting 10/10/2017, I will continue to take medications as prescribed and to keep appointments with PCP as scheduled.     . Patient Stated     11/29/2019,  I will  maintain and continue medications as prescribed.       Fall Risk Fall Risk  11/29/2019 10/29/2018 10/10/2017 11/07/2016 09/01/2016  Falls in the past year? 1 1 No No Yes  Comment tripped and fell - - - -  Number falls in past yr: 0 0 - - 1  Injury with Fall? 0 - - - Yes  Risk for fall due to : Medication side effect - - - -  Follow up Falls evaluation completed;Falls prevention discussed - - - -   Is the patient's home free of loose throw rugs in walkways, pet beds, electrical cords, etc?   yes      Grab bars in the bathroom? yes      Handrails on the stairs?   yes      Adequate lighting?   yes  Timed Get Up and Go Performed: N/A  Depression Screen PHQ 2/9 Scores 11/29/2019 10/29/2018 10/10/2017 02/01/2017  PHQ - 2 Score 0 0 0 1  PHQ- 9 Score 0 - 0 -    Cognitive Function MMSE - Mini Mental State Exam 11/29/2019 10/10/2017  Orientation to time 5 5  Orientation to Place 5 5  Registration 3 3  Attention/ Calculation 5 0  Recall 3 3  Language- name 2 objects - 0  Language- repeat 1 1  Language- follow 3 step command - 2  Language- follow 3 step command-comments - unable to complete 1 step of 3 step command  Language- read & follow direction - 0  Write a sentence - 0  Copy design - 0  Total score - 19  Mini Cog  Mini-Cog screen was completed. Maximum score is 22. A value of 0 denotes this part of the MMSE was not completed or the patient failed this part of the Mini-Cog screening.       Immunization History  Administered Date(s) Administered  . Influenza Split 06/07/2011, 06/07/2012  . Influenza Whole 07/31/2007, 06/04/2008, 06/03/2009, 05/20/2010  . Influenza, High Dose Seasonal PF 05/14/2019  . Influenza,inj,Quad PF,6+ Mos 06/26/2013, 06/11/2014, 05/26/2015, 04/26/2016, 05/22/2017, 06/01/2018  . PFIZER SARS-COV-2 Vaccination 11/16/2019  . Pneumococcal Conjugate-13 08/12/2013  . Pneumococcal Polysaccharide-23 07/29/2010  . Td 07/29/2010  . Tdap 12/22/2017    Qualifies for  Shingles Vaccine: Yes  Screening Tests Health Maintenance  Topic Date Due  . URINE MICROALBUMIN  Never done  . INFLUENZA VACCINE  03/22/2020  . TETANUS/TDAP  12/23/2027  . PNA vac Low Risk Adult  Completed   Cancer Screenings: Lung: Low Dose CT Chest recommended if Age 75-80 years, 30 pack-year currently smoking OR have quit w/in 15years. Patient does not qualify. Colorectal: no longer required  Additional Screenings:  Hepatitis C Screening: N/A      Plan:   Patient will maintain and continue medications as prescribed.  I have personally reviewed and noted the following in the patient's chart:   . Medical and social history . Use of alcohol, tobacco or illicit drugs  . Current medications and supplements . Functional ability and status . Nutritional status . Physical activity . Advanced directives . List of other physicians . Hospitalizations, surgeries, and ER visits in previous 12 months . Vitals . Screenings to include cognitive, depression, and falls . Referrals and appointments  In addition, I have reviewed and discussed with patient certain preventive protocols, quality metrics, and best practice recommendations. A written personalized care plan for preventive services as well as general preventive health recommendations were provided to patient.  Janalyn ShyJohnson, Saber Dickerman, LPN  1/6/10964/04/2020

## 2019-11-29 NOTE — Patient Instructions (Signed)
[name] , Thank you for taking time to come for your Medicare Wellness Visit. I appreciate your ongoing commitment to your health goals. Please review the following plan we discussed and let me know if I can assist you in the future.   Screening recommendations/referrals: Colonoscopy: no longer required Recommended yearly ophthalmology/optometry visit for glaucoma screening and checkup Recommended yearly dental visit for hygiene and checkup  Vaccinations: Influenza vaccine: Up to date, completed 05/14/2019 Pneumococcal vaccine: Completed series Tdap vaccine: Up to date, completed 12/22/2017 Shingles vaccine: discussed    Advanced directives: Please bring a copy of your POA (Power of [place]ro[place]) and/or Living Will to your next appointment.   Conditions/risks identified: hypertension, hyperlipidemia  Next appointment: none   Preventive Care 65 Years and Older, Male Preventive care refers to lifestyle choices and visits with your health care provider that can promote health and wellness. What does preventive care include?  A yearly physical exam. This is also called an annual well check.  Dental exams once or twice a year.  Routine eye exams. Ask your health care provider how often you should have your eyes checked.  Personal lifestyle choices, including:  Daily care of your teeth and gums.  Regular physical activity.  Eating a healthy diet.  Avoiding tobacco and drug use.  Limiting alcohol use.  Practicing safe sex.  Taking low doses of aspirin every day.  Taking vitamin and mineral supplements as recommended by your health care provider. What happens during an annual well check? The services and screenings done by your health care provider during your annual well check will depend on your age, overall health, lifestyle risk factors, and family history of disease. Counseling  Your health care provider may ask you questions about your:  Alcohol use.  Tobacco  use.  Drug use.  Emotional well-being.  Home and relationship well-being.  Sexual activity.  Eating habits.  History of falls.  Memory and ability to understand (cognition).  Work and work Astronomer. Screening  You may have the following tests or measurements:  Height, weight, and BMI.  Blood pressure.  Lipid and cholesterol levels. These may be checked every 5 years, or more frequently if you are over 89 years old.  Skin check.  Lung cancer screening. You may have this screening every year starting at age 57 if you have a 30-pack-year history of smoking and currently smoke or have quit within the past 15 years.  Fecal occult blood test (FOBT) of the stool. You may have this test every year starting at age 35.  Flexible sigmoidoscopy or colonoscopy. You may have a sigmoidoscopy every 5 years or a colonoscopy every 10 years starting at age 76.  Prostate cancer screening. Recommendations will vary depending on your family history and other risks.  Hepatitis C blood test.  Hepatitis B blood test.  Sexually transmitted disease (STD) testing.  Diabetes screening. This is done by checking your blood sugar (glucose) after you have not eaten for a while (fasting). You may have this done every 1-3 years.  Abdominal aortic aneurysm (AAA) screening. You may need this if you are a current or former smoker.  Osteoporosis. You may be screened starting at age 38 if you are at high risk. Talk with your health care provider about your test results, treatment options, and if necessary, the need for more tests. Vaccines  Your health care provider may recommend certain vaccines, such as:  Influenza vaccine. This is recommended every year.  Tetanus, diphtheria, and acellular pertussis (  Tdap, Td) vaccine. You may need a Td booster every 10 years.  Zoster vaccine. You may need this after age 42.  Pneumococcal 13-valent conjugate (PCV13) vaccine. One dose is recommended after age  57.  Pneumococcal polysaccharide (PPSV23) vaccine. One dose is recommended after age 2. Talk to your health care provider about which screenings and vaccines you need and how often you need them. This information is not intended to replace advice given to you by your health care provider. Make sure you discuss any questions you have with your health care provider. Document Released: 09/04/2015 Document Revised: 04/27/2016 Document Reviewed: 06/09/2015 Elsevier Interactive Patient Education  2017 [place] Prevention in the Home Falls can cause injuries. They can happen to people of all ages. There are many things you can do to make your home safe and to help prevent falls. What can I do on the outside of my home?  Regularly fix the edges of walkways and driveways and fix any cracks.  Remove anything that might make you trip as you walk through a door, such as a raised step or threshold.  Trim any bushes or trees on the path to your home.  Use bright outdoor lighting.  Clear any walking paths of anything that might make someone trip, such as rocks or tools.  Regularly check to see if handrails are loose or broken. Make sure that both sides of any steps have handrails.  Any raised decks and porches should have guardrails on the edges.  Have any leaves, snow, or ice cleared regularly.  Use sand or salt on walking paths during winter.  Clean up any spills in your garage right away. This includes oil or grease spills. What can I do in the bathroom?  Use night lights.  Install grab bars by the toilet and in the tub and shower. Do not use towel bars as grab bars.  Use non-skid mats or decals in the tub or shower.  If you need to sit down in the shower, use a plastic, non-slip stool.  Keep the floor dry. Clean up any water that spills on the floor as soon as it happens.  Remove soap buildup in the tub or shower regularly.  Attach bath mats securely with double-sided  non-slip rug tape.  Do not have throw rugs and other things on the floor that can make you trip. What can I do in the bedroom?  Use night lights.  Make sure that you have a light by your bed that is easy to reach.  Do not use any sheets or blankets that are too big for your bed. They should not hang down onto the floor.  Have a firm chair that has side arms. You can use this for support while you get dressed.  Do not have throw rugs and other things on the floor that can make you trip. What can I do in the kitchen?  Clean up any spills right away.  Avoid walking on wet floors.  Keep items that you use a lot in easy-to-reach places.  If you need to reach something above you, use a strong step stool that has a grab bar.  Keep electrical cords out of the way.  Do not use floor polish or wax that makes floors slippery. If you must use wax, use non-skid floor wax.  Do not have throw rugs and other things on the floor that can make you trip. What can I do with my stairs?  Do  not leave any items on the stairs.  Make sure that there are handrails on both sides of the stairs and use them. Fix handrails that are broken or loose. Make sure that handrails are as long as the stairways.  Check any carpeting to make sure that it is firmly attached to the stairs. Fix any carpet that is loose or worn.  Avoid having throw rugs at the top or bottom of the stairs. If you do have throw rugs, attach them to the floor with carpet tape.  Make sure that you have a light switch at the top of the stairs and the bottom of the stairs. If you do not have them, ask someone to add them for you. What else can I do to help prevent falls?  Wear shoes that:  Do not have high heels.  Have rubber bottoms.  Are comfortable and fit you well.  Are closed at the toe. Do not wear sandals.  If you use a stepladder:  Make sure that it is fully opened. Do not climb a closed stepladder.  Make sure that both  sides of the stepladder are locked into place.  Ask someone to hold it for you, if possible.  Clearly mark and make sure that you can see:  Any grab bars or handrails.  First and last steps.  Where the edge of each step is.  Use tools that help you move around (mobility aids) if they are needed. These include:  Canes.  Walkers.  Scooters.  Crutches.  Turn on the lights when you go into a dark area. Replace any light bulbs as soon as they burn out.  Set up your furniture so you have a clear path. Avoid moving your furniture around.  If any of your floors are uneven, fix them.  If there are any pets around you, be aware of where they are.  Review your medicines with your doctor. Some medicines can make you feel dizzy. This can increase your chance of falling. Ask your doctor what other things that you can do to help prevent falls. This information is not intended to replace advice given to you by your health care provider. Make sure you discuss any questions you have with your health care provider. Document Released: 06/04/2009 Document Revised: 01/14/2016 Document Reviewed: 09/12/2014 Elsevier Interactive Patient Education  2017 [employer].

## 2019-11-29 NOTE — Progress Notes (Signed)
PCP notes:  Health Maintenance: No gaps noted   Abnormal Screenings: none   Patient concerns: Discuss mobility issues   Nurse concerns: none   Next PCP appt.: none

## 2019-12-01 NOTE — Telephone Encounter (Signed)
Labs ordered. Thanks!

## 2019-12-02 ENCOUNTER — Other Ambulatory Visit: Payer: Self-pay

## 2019-12-02 ENCOUNTER — Other Ambulatory Visit (INDEPENDENT_AMBULATORY_CARE_PROVIDER_SITE_OTHER): Payer: PPO

## 2019-12-02 DIAGNOSIS — E782 Mixed hyperlipidemia: Secondary | ICD-10-CM | POA: Diagnosis not present

## 2019-12-02 DIAGNOSIS — E538 Deficiency of other specified B group vitamins: Secondary | ICD-10-CM | POA: Diagnosis not present

## 2019-12-02 DIAGNOSIS — E611 Iron deficiency: Secondary | ICD-10-CM

## 2019-12-02 LAB — LIPID PANEL
Cholesterol: 138 mg/dL (ref 0–200)
HDL: 56.5 mg/dL (ref >39.00)
LDL Cholesterol: 65 mg/dL (ref 0–99)
NonHDL: 81.32
Total CHOL/HDL Ratio: 2
Triglycerides: 84 mg/dL (ref 0.0–149.0)
VLDL: 16.8 mg/dL (ref 0.0–40.0)

## 2019-12-02 LAB — CBC WITH DIFFERENTIAL/PLATELET
Basophils Absolute: 0 10*3/uL (ref 0.0–0.1)
Basophils Relative: 0.6 % (ref 0.0–3.0)
Eosinophils Absolute: 0.3 10*3/uL (ref 0.0–0.7)
Eosinophils Relative: 4.2 % (ref 0.0–5.0)
HCT: 43.4 % (ref 39.0–52.0)
Hemoglobin: 14.4 g/dL (ref 13.0–17.0)
Lymphocytes Relative: 24.1 % (ref 12.0–46.0)
Lymphs Abs: 1.8 10*3/uL (ref 0.7–4.0)
MCHC: 33.2 g/dL (ref 30.0–36.0)
MCV: 91.6 fl (ref 78.0–100.0)
Monocytes Absolute: 0.6 10*3/uL (ref 0.1–1.0)
Monocytes Relative: 7.9 % (ref 3.0–12.0)
Neutro Abs: 4.6 10*3/uL (ref 1.4–7.7)
Neutrophils Relative %: 63.2 % (ref 43.0–77.0)
Platelets: 303 10*3/uL (ref 150.0–400.0)
RBC: 4.74 Mil/uL (ref 4.22–5.81)
RDW: 13.3 % (ref 11.5–15.5)
WBC: 7.3 10*3/uL (ref 4.0–10.5)

## 2019-12-02 LAB — COMPREHENSIVE METABOLIC PANEL
ALT: 8 U/L (ref 0–53)
AST: 18 U/L (ref 0–37)
Albumin: 4.1 g/dL (ref 3.5–5.2)
Alkaline Phosphatase: 83 U/L (ref 39–117)
BUN: 16 mg/dL (ref 6–23)
CO2: 26 mEq/L (ref 19–32)
Calcium: 9.3 mg/dL (ref 8.4–10.5)
Chloride: 103 mEq/L (ref 96–112)
Creatinine, Ser: 1.05 mg/dL (ref 0.40–1.50)
GFR: 66.04 mL/min (ref >60.00)
Glucose, Bld: 91 mg/dL (ref 70–99)
Potassium: 3.7 mEq/L (ref 3.5–5.1)
Sodium: 138 mEq/L (ref 135–145)
Total Bilirubin: 0.4 mg/dL (ref 0.2–1.2)
Total Protein: 7.1 g/dL (ref 6.0–8.3)

## 2019-12-02 LAB — VITAMIN B12: Vitamin B-12: 498 pg/mL (ref 211–911)

## 2019-12-02 LAB — IRON: Iron: 97 ug/dL (ref 42–165)

## 2019-12-02 NOTE — Telephone Encounter (Signed)
Agreed, see notes on labs.  Thanks.

## 2019-12-02 NOTE — Telephone Encounter (Signed)
Yes I originally called patient Thursday to get scheduled but the line was busy but I was able to get Mr. And [name] scheduled for CPE'S on May 4th. Would they need labs done again?

## 2019-12-02 NOTE — Telephone Encounter (Signed)
Probably not, thanks

## 2019-12-02 NOTE — Telephone Encounter (Signed)
Patient had lab work today.  I don't see that he has a CPE scheduled.  See previous note.

## 2019-12-07 ENCOUNTER — Other Ambulatory Visit: Payer: Self-pay | Admitting: Family Medicine

## 2019-12-09 ENCOUNTER — Telehealth: Payer: Self-pay

## 2019-12-09 DIAGNOSIS — G2581 Restless legs syndrome: Secondary | ICD-10-CM

## 2019-12-09 MED ORDER — ROPINIROLE HCL 0.5 MG PO TABS: 0.5000 mg | ORAL_TABLET | Freq: Every day | ORAL | 3 refills | Status: DC

## 2019-12-09 NOTE — Telephone Encounter (Signed)
[name] [facility] signed) left v/m requesting refill of ropinirole 0.5 mg so will not have to cut the ropinirole 1 mg in half. [facility]  Name of Medication: ropinirole 1 mg Name of Pharmacy: [facility] Last Fill or Written Date and Quantity: # 90 x 3 on 10/17/18 At 01/17/19 office note ropinirole 1 mg was cut back to 1/2 tab at night. Please advise. Pt has appt for CPX on 12/24/2019.

## 2019-12-09 NOTE — Telephone Encounter (Signed)
Sent. Thanks.   

## 2019-12-10 ENCOUNTER — Ambulatory Visit: Payer: PPO | Attending: Internal Medicine

## 2019-12-10 DIAGNOSIS — Z23 Encounter for immunization: Secondary | ICD-10-CM

## 2019-12-10 NOTE — Progress Notes (Signed)
   Covid-19 Vaccination Clinic  Name:  [name]    MRN: [id number] DOB: 09/30/1925  12/10/2019  [name] was observed post Covid-19 immunization for 15 minutes without incident. He was provided with Vaccine Information Sheet and instruction to access the V-Safe system.   [name] was instructed to call 911 with any severe reactions post vaccine: [name] Kitchen Difficulty breathing  . Swelling of face and throat  . A fast heartbeat  . A bad rash all over body  . Dizziness and weakness   Immunizations Administered    Name Date Dose VIS Date Route   Pfizer COVID-19 Vaccine 12/10/2019  9:51 AM 0.3 mL 10/16/2018 Intramuscular   Manufacturer: [employer], [employer]   Lot: [id number]   NDC: [address]-7

## 2019-12-11 DIAGNOSIS — Z961 Presence of intraocular lens: Secondary | ICD-10-CM | POA: Diagnosis not present

## 2019-12-11 DIAGNOSIS — H52203 Unspecified astigmatism, bilateral: Secondary | ICD-10-CM | POA: Diagnosis not present

## 2019-12-11 DIAGNOSIS — H353231 Exudative age-related macular degeneration, bilateral, with active choroidal neovascularization: Secondary | ICD-10-CM | POA: Diagnosis not present

## 2019-12-12 ENCOUNTER — Encounter: Payer: PPO | Admitting: Family Medicine

## 2019-12-16 DIAGNOSIS — H353231 Exudative age-related macular degeneration, bilateral, with active choroidal neovascularization: Secondary | ICD-10-CM | POA: Diagnosis not present

## 2019-12-16 DIAGNOSIS — H353211 Exudative age-related macular degeneration, right eye, with active choroidal neovascularization: Secondary | ICD-10-CM | POA: Diagnosis not present

## 2019-12-20 ENCOUNTER — Other Ambulatory Visit: Payer: Self-pay | Admitting: Family Medicine

## 2019-12-20 NOTE — Telephone Encounter (Signed)
Electronic refill request. Tramadol Last office visit:   08/29/2019 Last Filled:    60 tablet 3 08/19/2019  Please advise.

## 2019-12-21 NOTE — Telephone Encounter (Signed)
Last filled on 08/19/19 #60 with 3 refills,  LOV 08/29/19 for acute visit Next appointment on 12/24/19 for CPE

## 2019-12-22 MED ORDER — TRAMADOL HCL 50 MG PO TABS: ORAL_TABLET | ORAL | 3 refills | Status: DC

## 2019-12-22 NOTE — Telephone Encounter (Signed)
Done via other refill request.  Thanks.

## 2019-12-22 NOTE — Telephone Encounter (Signed)
Sent. Thanks.   

## 2019-12-23 ENCOUNTER — Telehealth: Payer: Self-pay

## 2019-12-23 NOTE — Telephone Encounter (Signed)
Script sent in yesterday.

## 2019-12-23 NOTE — Telephone Encounter (Signed)
[facility] Night - Client TELEPHONE ADVICE RECORD AccessNurse Patient Name: [name] Gender: Male DOB: 1926-02-19 Age: 86 Y 2 M 4 D Return Phone Number: [phone] (Primary) Address[place] [address] Client [facility] Night - Client Client Site [facility] - Night Physician Raechel Ache - MD Contact Type Call Who Is Calling Patient / Member / Family / Caregiver Call Type Triage / Clinical Caller Name [name] Relationship To Patient Spouse Return Phone Number [phone] (Primary) Chief Complaint Prescription Refill or Medication Request (non symptomatic) Reason for Call Medication Question / Request Initial Comment Caller is needing her husbands tramadol medication refilled. Caller states her husband has 3 left. Translation No Nurse Assessment Nurse: Maisie Fus, RN, Cheri Date/Time (Eastern Time): 12/20/2019 8:17:26 PM Confirm and document reason for call. If symptomatic, describe symptoms. ---Caller states patient has a lot of pain and is running out of his Tramadol. States he has three tablets left. States he is not having any new or worsening symptoms. Has the patient had close contact with a person known or suspected to have the novel coronavirus illness OR traveled / lives in area with major community spread (including international travel) in the last 14 days from the onset of symptoms? * If Asymptomatic, screen for exposure and travel within the last 14 days. ---No Does the patient have any new or worsening symptoms? ---No Please document clinical information provided and list any resource used. ---Per client directive, explained to Caller that medications are not handled after hours and she will have to contact the office during regular office hours. Given phone number for virtual visit for Saturday so she can reach out to the Dr at that time as  well. [phone] Guidelines Guideline Title Affirmed Question Affirmed Notes Nurse Date/Time Lamount Cohen Time) Disp. Time Lamount Cohen Time) Disposition Final User 12/20/2019 8:22:40 PM Clinical Call Yes Maisie Fus, RN, CheriPLEASE NOTE: All timestamps contained within this report are represented as [place] Standard Time. CONFIDENTIALTY NOTICE: This fax transmission is intended only for the addressee. It contains information that is legally privileged, confidential or otherwise protected from use or disclosure. If you are not the intended recipient, you are strictly prohibited from reviewing, disclosing, copying using or disseminating any of this information or taking any action in reliance on or regarding this information. If you have received this fax in error, please notify us immediately by telephone so that we can arrange for its return to [place]. Phone: [phone], Toll-Free: [phone], Fax: [phone] Page: 2 of 2 Call Id: [id number] Comments User: Katheran Awe, RN Date/Time Lamount Cohen Time): 12/20/2019 8:19:42 PM Referred to Virtual Saturday number. [phone]

## 2019-12-24 ENCOUNTER — Encounter: Payer: PPO | Admitting: Family Medicine

## 2019-12-31 ENCOUNTER — Other Ambulatory Visit: Payer: PPO

## 2020-01-07 ENCOUNTER — Encounter: Payer: PPO | Admitting: Family Medicine

## 2020-01-12 ENCOUNTER — Other Ambulatory Visit: Payer: Self-pay | Admitting: Family Medicine

## 2020-01-16 ENCOUNTER — Other Ambulatory Visit: Payer: Self-pay | Admitting: Family Medicine

## 2020-01-22 DIAGNOSIS — H7292 Unspecified perforation of tympanic membrane, left ear: Secondary | ICD-10-CM | POA: Diagnosis not present

## 2020-01-22 DIAGNOSIS — H903 Sensorineural hearing loss, bilateral: Secondary | ICD-10-CM | POA: Diagnosis not present

## 2020-01-22 DIAGNOSIS — H938X2 Other specified disorders of left ear: Secondary | ICD-10-CM | POA: Diagnosis not present

## 2020-01-22 DIAGNOSIS — H6121 Impacted cerumen, right ear: Secondary | ICD-10-CM | POA: Diagnosis not present

## 2020-01-22 DIAGNOSIS — J343 Hypertrophy of nasal turbinates: Secondary | ICD-10-CM | POA: Diagnosis not present

## 2020-01-22 DIAGNOSIS — Z974 Presence of external hearing-aid: Secondary | ICD-10-CM | POA: Diagnosis not present

## 2020-01-23 DIAGNOSIS — H43813 Vitreous degeneration, bilateral: Secondary | ICD-10-CM | POA: Diagnosis not present

## 2020-01-23 DIAGNOSIS — H35423 Microcystoid degeneration of retina, bilateral: Secondary | ICD-10-CM | POA: Diagnosis not present

## 2020-01-23 DIAGNOSIS — H353231 Exudative age-related macular degeneration, bilateral, with active choroidal neovascularization: Secondary | ICD-10-CM | POA: Diagnosis not present

## 2020-01-24 ENCOUNTER — Encounter: Payer: Self-pay | Admitting: Family Medicine

## 2020-01-24 ENCOUNTER — Other Ambulatory Visit: Payer: Self-pay

## 2020-01-24 ENCOUNTER — Ambulatory Visit (INDEPENDENT_AMBULATORY_CARE_PROVIDER_SITE_OTHER): Payer: PPO | Admitting: Family Medicine

## 2020-01-24 ENCOUNTER — Encounter: Payer: PPO | Admitting: Family Medicine

## 2020-01-24 DIAGNOSIS — M545 Low back pain, unspecified: Secondary | ICD-10-CM

## 2020-01-24 DIAGNOSIS — G2581 Restless legs syndrome: Secondary | ICD-10-CM | POA: Diagnosis not present

## 2020-01-24 DIAGNOSIS — K2270 Barrett's esophagus without dysplasia: Secondary | ICD-10-CM

## 2020-01-24 DIAGNOSIS — K219 Gastro-esophageal reflux disease without esophagitis: Secondary | ICD-10-CM | POA: Diagnosis not present

## 2020-01-24 DIAGNOSIS — I10 Essential (primary) hypertension: Secondary | ICD-10-CM | POA: Diagnosis not present

## 2020-01-24 DIAGNOSIS — Z0000 Encounter for general adult medical examination without abnormal findings: Secondary | ICD-10-CM

## 2020-01-24 DIAGNOSIS — E782 Mixed hyperlipidemia: Secondary | ICD-10-CM

## 2020-01-24 DIAGNOSIS — Z7189 Other specified counseling: Secondary | ICD-10-CM

## 2020-01-24 MED ORDER — DILTIAZEM HCL ER COATED BEADS 180 MG PO CP24: 180.0000 mg | ORAL_CAPSULE | Freq: Every day | ORAL | 3 refills | Status: DC

## 2020-01-24 NOTE — Progress Notes (Signed)
This visit occurred during the SARS-CoV-2 public health emergency.  Safety protocols were in place, including screening questions prior to the visit, additional usage of staff PPE, and extensive cleaning of exam room while observing appropriate contact time as indicated for disinfecting solutions.  Elevated Cholesterol: Using medications without problems: yes, still on praluent and gemfibrozil.   Muscle aches: not from meds, likely from OA.  Diet compliance: yes Exercise: encouraged.    Fall cautions d/w pt.  He was not/is not lightheaded.  "I can't get around like I'm 20 and I need to be careful."    He has some GI upset with certain foods, so he avoid triggers (esp greasy foods).  He avoid eating late, d/w pt.    He had B eye injections yesterday.   Hypertension:    Using medication without problems or lightheadedness: yes Chest pain with exertion:no Edema:no Short of breath:no Still on diltiazem and HCTZ at baseline.   RLS controlled with requip at baseline.  No blood in stool.  H/o iron replacement.  No ADE on iron.  Labs discussed with patient.  Pain improved with tramadol, with relief.  No ADE, he didn't think it contributed to the fall.    Advance directive-wife designated if patient were incapacitated.  Son designated if wife were unavailable or incapacitated. covid vaccine 2021 PNA up to date.   Tetanus 2019 Shingles d/w pt.  It may be cheaper at the pharmacy.   Flu yearly.  Defer colon and prostate cancer screening.    B12 wnl.  D/w pt. on B12 replacement, 2000 mcg daily.  PMH and SH reviewed  Meds, vitals, and allergies reviewed.   ROS: Per HPI unless specifically indicated in ROS section   GEN: nad, alert and oriented HEENT: ncat NECK: supple w/o LA CV: rrr. PULM: ctab, no inc wob ABD: soft, +bs EXT: no edema SKIN: no acute rash B subconjunctival hemorrhage as expected given his injection yesterday

## 2020-01-24 NOTE — Patient Instructions (Signed)
Thanks for your effort.  Don't change your meds for now.  Glad you got your covid vaccine.  Take care.  Glad to see you. Let me know when you need refills.

## 2020-01-27 DIAGNOSIS — Z0000 Encounter for general adult medical examination without abnormal findings: Secondary | ICD-10-CM | POA: Insufficient documentation

## 2020-01-27 NOTE — Assessment & Plan Note (Signed)
Advance directive-wife designated if patient were incapacitated. Son designated if wife were unavailable or incapacitated. 

## 2020-01-27 NOTE — Assessment & Plan Note (Signed)
No change in hydrochlorothiazide or diltiazem at this point.  He will update me if he gets lightheaded.  Routine cautions given to patient.

## 2020-01-27 NOTE — Assessment & Plan Note (Signed)
RLS controlled with requip at baseline.  No blood in stool.  H/o iron replacement.  No ADE on iron.  Labs discussed with patient.  Continue Requip and iron.  He agrees.

## 2020-01-27 NOTE — Assessment & Plan Note (Signed)
Would avoid trigger foods and he can update me as needed.

## 2020-01-27 NOTE — Assessment & Plan Note (Signed)
Continue Praluent and gemfibrozil.  Labs discussed with patient.

## 2020-01-27 NOTE — Assessment & Plan Note (Signed)
Advance directive-wife designated if patient were incapacitated.  Son designated if wife were unavailable or incapacitated. covid vaccine 2021 PNA up to date.   Tetanus 2019 Shingles d/w pt.  It may be cheaper at the pharmacy.   Flu yearly.  Defer colon and prostate cancer screening.  He agrees, given his age.

## 2020-01-27 NOTE — Assessment & Plan Note (Signed)
History of, would continue pantoprazole.

## 2020-01-27 NOTE — Assessment & Plan Note (Signed)
And knee pain.  Pain improved with tramadol, with relief.  No ADE, he didn't think it contributed to the fall.  Continue as is.  He agrees.

## 2020-02-18 NOTE — Telephone Encounter (Signed)
Praluent was refilled 10/23/19.

## 2020-02-27 DIAGNOSIS — H906 Mixed conductive and sensorineural hearing loss, bilateral: Secondary | ICD-10-CM | POA: Diagnosis not present

## 2020-03-02 DIAGNOSIS — H353211 Exudative age-related macular degeneration, right eye, with active choroidal neovascularization: Secondary | ICD-10-CM | POA: Diagnosis not present

## 2020-03-02 DIAGNOSIS — H353231 Exudative age-related macular degeneration, bilateral, with active choroidal neovascularization: Secondary | ICD-10-CM | POA: Diagnosis not present

## 2020-03-27 ENCOUNTER — Telehealth: Payer: Self-pay | Admitting: Family Medicine

## 2020-03-27 NOTE — Telephone Encounter (Signed)
Patients wife Kara Mead called wanting Lugene to give her a call back when she has a minute. Please give patient a call at [phone].

## 2020-04-02 DIAGNOSIS — H353211 Exudative age-related macular degeneration, right eye, with active choroidal neovascularization: Secondary | ICD-10-CM | POA: Diagnosis not present

## 2020-04-02 DIAGNOSIS — H353231 Exudative age-related macular degeneration, bilateral, with active choroidal neovascularization: Secondary | ICD-10-CM | POA: Diagnosis not present

## 2020-04-02 DIAGNOSIS — H353221 Exudative age-related macular degeneration, left eye, with active choroidal neovascularization: Secondary | ICD-10-CM | POA: Diagnosis not present

## 2020-04-02 DIAGNOSIS — H35423 Microcystoid degeneration of retina, bilateral: Secondary | ICD-10-CM | POA: Diagnosis not present

## 2020-04-02 DIAGNOSIS — H43813 Vitreous degeneration, bilateral: Secondary | ICD-10-CM | POA: Diagnosis not present

## 2020-04-06 MED ORDER — PRALUENT 150 MG/ML ~~LOC~~ SOAJ: SUBCUTANEOUS | 3 refills | Status: DC

## 2020-04-06 NOTE — Telephone Encounter (Signed)
Patient's wife called in again stating she would like to speak with Lugene in regards to husbands medication. Please advise.

## 2020-04-06 NOTE — Telephone Encounter (Signed)
rx printed.  Thanks.  I'll sign it tomorrow.

## 2020-04-06 NOTE — Telephone Encounter (Signed)
Patient's wife says they need a written Rx for the Praluent with refills to present to the Patient Assistance Program.

## 2020-04-07 NOTE — Telephone Encounter (Signed)
Signed and faxed to Patient Assistance Program.

## 2020-04-08 ENCOUNTER — Telehealth: Payer: Self-pay | Admitting: Family Medicine

## 2020-04-08 NOTE — Telephone Encounter (Signed)
Joni Reining called in stating script for Alirocumab (PRALUENT) 150 MG/ML SOAJ is needing to be re-faxed over with Date of script as well. Please advise. Fax is [phone]

## 2020-04-09 NOTE — Telephone Encounter (Signed)
Phoned the company and issue is resolved.

## 2020-04-14 ENCOUNTER — Other Ambulatory Visit: Payer: Self-pay | Admitting: Family Medicine

## 2020-04-14 NOTE — Telephone Encounter (Signed)
Electronic refill request. Tramadol Last office visit:  01/24/2020 Last Filled:     60 tablet 3 12/22/2019

## 2020-04-15 NOTE — Telephone Encounter (Signed)
Sent. Thanks.   

## 2020-05-07 DIAGNOSIS — H353231 Exudative age-related macular degeneration, bilateral, with active choroidal neovascularization: Secondary | ICD-10-CM | POA: Diagnosis not present

## 2020-05-07 DIAGNOSIS — H353211 Exudative age-related macular degeneration, right eye, with active choroidal neovascularization: Secondary | ICD-10-CM | POA: Diagnosis not present

## 2020-05-08 DIAGNOSIS — Z1159 Encounter for screening for other viral diseases: Secondary | ICD-10-CM | POA: Diagnosis not present

## 2020-05-08 DIAGNOSIS — Z20828 Contact with and (suspected) exposure to other viral communicable diseases: Secondary | ICD-10-CM | POA: Diagnosis not present

## 2020-05-13 ENCOUNTER — Other Ambulatory Visit: Payer: Self-pay | Admitting: Family Medicine

## 2020-06-11 DIAGNOSIS — H43813 Vitreous degeneration, bilateral: Secondary | ICD-10-CM | POA: Diagnosis not present

## 2020-06-11 DIAGNOSIS — H35423 Microcystoid degeneration of retina, bilateral: Secondary | ICD-10-CM | POA: Diagnosis not present

## 2020-06-11 DIAGNOSIS — H353231 Exudative age-related macular degeneration, bilateral, with active choroidal neovascularization: Secondary | ICD-10-CM | POA: Diagnosis not present

## 2020-06-25 ENCOUNTER — Other Ambulatory Visit: Payer: Self-pay | Admitting: Family Medicine

## 2020-06-25 NOTE — Telephone Encounter (Signed)
Pharmacy requests refill on: Hydrochlorothiazide 12.5 mg   LAST REFILL: 01/16/2020 LAST OV: 01/24/2020 NEXT OV: Not scheduled (No follow-up needed per Dr. Para March after last visit) PHARMACY: [facility] [phone] [place], [place]   Called patient and spoke with his wife (ok according to [facility]) who stated that patient is not having any issues with lightheadedness or dizziness.

## 2020-07-07 ENCOUNTER — Telehealth: Payer: Self-pay | Admitting: Family Medicine

## 2020-07-07 DIAGNOSIS — E782 Mixed hyperlipidemia: Secondary | ICD-10-CM

## 2020-07-07 NOTE — Telephone Encounter (Signed)
Called patients wife to get clarification on her request for Praluent. Patients wife stated that she did not need anymore refills. Patients wife wanted to check to see if we have received a paper from Patient Assistance Program regarding the medication. According to the patients wife, this paper is to be filled out by the provider in order to get the medication from a mail order service to get the medication at a cheaper price. This RN informed the patients wife that I was unsure if we have received this or not. Patient stated that she could bring this paper by the office if needed.

## 2020-07-07 NOTE — Telephone Encounter (Signed)
Please have them bring the form and I'll work on it.  Please give pt/spouse my regards.  Thanks.

## 2020-07-07 NOTE — Telephone Encounter (Signed)
Pt wife called in about getting a praluent renewed for her husband [name]

## 2020-07-08 NOTE — Telephone Encounter (Signed)
Placed paperwork on Dr. Lianne Bushy desk for completion. Okay to return to [name]'s desk when completed to be faxed. Will update patient upon completion.

## 2020-07-08 NOTE — Telephone Encounter (Signed)
I will work on it.  Thanks.  

## 2020-07-08 NOTE — Telephone Encounter (Signed)
Spoke with patients wife and she stated that she would bring the form to the office.

## 2020-07-08 NOTE — Telephone Encounter (Signed)
Wife dropped forms off, and asked for a call if any questions.

## 2020-07-09 NOTE — Telephone Encounter (Signed)
Form signed. Thanks!

## 2020-07-10 NOTE — Telephone Encounter (Signed)
Spoke with patients wife and updated her that paperwork is complete and will be faxed. Copy of form left at front office for pick-up.

## 2020-07-13 DIAGNOSIS — H353231 Exudative age-related macular degeneration, bilateral, with active choroidal neovascularization: Secondary | ICD-10-CM | POA: Diagnosis not present

## 2020-07-13 DIAGNOSIS — H353211 Exudative age-related macular degeneration, right eye, with active choroidal neovascularization: Secondary | ICD-10-CM | POA: Diagnosis not present

## 2020-08-13 NOTE — Telephone Encounter (Signed)
Received fax from [employer] Patient Assistance Program stating that patient needed to reenroll and to send application. Application previously sent as stated below. Contacted [employer] and left message for call back to seek further clarification.

## 2020-08-20 DIAGNOSIS — H353231 Exudative age-related macular degeneration, bilateral, with active choroidal neovascularization: Secondary | ICD-10-CM | POA: Diagnosis not present

## 2020-08-20 DIAGNOSIS — H353221 Exudative age-related macular degeneration, left eye, with active choroidal neovascularization: Secondary | ICD-10-CM | POA: Diagnosis not present

## 2020-08-20 DIAGNOSIS — H353211 Exudative age-related macular degeneration, right eye, with active choroidal neovascularization: Secondary | ICD-10-CM | POA: Diagnosis not present

## 2020-08-20 NOTE — Telephone Encounter (Signed)
Called and spoke with [employer] representative who stated that the application form was received on 07/10/2020. No further actions needed at this time.

## 2020-08-31 ENCOUNTER — Other Ambulatory Visit: Payer: Self-pay | Admitting: Family Medicine

## 2020-08-31 NOTE — Telephone Encounter (Signed)
Pease advise on refill. Thank you.  

## 2020-08-31 NOTE — Telephone Encounter (Signed)
Sent. Thanks.   

## 2020-09-08 ENCOUNTER — Telehealth (INDEPENDENT_AMBULATORY_CARE_PROVIDER_SITE_OTHER): Payer: PPO | Admitting: Family Medicine

## 2020-09-08 ENCOUNTER — Encounter: Payer: Self-pay | Admitting: Family Medicine

## 2020-09-08 ENCOUNTER — Telehealth: Payer: Self-pay

## 2020-09-08 VITALS — BP 159/89 | HR 84 | Temp 99.1°F | Ht 65.5 in

## 2020-09-08 DIAGNOSIS — H1033 Unspecified acute conjunctivitis, bilateral: Secondary | ICD-10-CM | POA: Diagnosis not present

## 2020-09-08 DIAGNOSIS — R059 Cough, unspecified: Secondary | ICD-10-CM | POA: Diagnosis not present

## 2020-09-08 MED ORDER — AMOXICILLIN 500 MG PO CAPS: 1000.0000 mg | ORAL_CAPSULE | Freq: Two times a day (BID) | ORAL | 0 refills | Status: DC

## 2020-09-08 NOTE — Telephone Encounter (Signed)
Per chart review tab pt has already had televisit with  Dr Ermalene Searing today.

## 2020-09-08 NOTE — Telephone Encounter (Signed)
[facility] Night - Client Nonclinical Telephone Record AccessNurse Client [facility] Night - Client Client Site [facility] - Night Physician Raechel Ache - MD Contact Type Call Who Is Calling Patient / Member / Family / Caregiver Caller Name Gregoire Bennis Caller Phone Number [phone] Patient Name [name] Patient DOB 1926-05-13 Call Type Message Only Information Provided Reason for Call Request to Schedule Office Appointment Initial Comment Caller states that she needs to make an appointment for her husband. Declined triage. He has a sore throat and a runny nose. Disp. Time Disposition Final User 09/08/2020 8:35:17 AM General Information Provided Yes Brooke Pace Call Closed By: Brooke Pace Transaction Date/Time: 09/08/2020 8:32:24 AM (ET)

## 2020-09-08 NOTE — Progress Notes (Signed)
VIRTUAL VISIT Due to national recommendations of social distancing due to COVID 19, a virtual visit is felt to be most appropriate for this patient at this time.   I connected with the patient on 09/08/20 at  2:00 PM EST by virtual telehealth platform and verified that I am speaking with the correct person using two identifiers.  Interactive audio and video telecommunications were attempted between this provider and patient, however failed, due to patient having technical difficulties OR patient did not have access to video capability.  We continued and completed visit with audio only.     I discussed the limitations, risks, security and privacy concerns of performing an evaluation and management service by  virtual telehealth platform and the availability of in person appointments. I also discussed with the patient that there may be a patient responsible charge related to this service. The patient expressed understanding and agreed to proceed.  Patient location: Home Provider Location: [place] [facility] Participants: Kerby Nora and [name]   Chief Complaint  Patient presents with  . Cough  . Nasal Congestion  . Conjunctivitis  . Sore Throat    History of Present Illness:  87 year old male patient of Dr. Lianne Bushy with history of  ?COPD, CAD and HTN presents with new onset cough, congestion and sore throat.   He reports 1 week ago started with sore throat.  Nasal congestion, productive cough..white phlegm. No SOB, no wheeze. No fever... 99.90F  In last  he has a lot of discharge in eyes... white crusty. Eyes are burning. Redness of conjunctive and redness around eye. No swelling.  Hx Exudative macular degeneration.[name] Kitchen last eye MD visit 07/13/2020   COVID vaccine11/10/2019 , 12/10/2019 , 11/16/2019   Negative COVID test.  COVID 19 screen No recent travel or known exposure to COVID19   The importance of social distancing was discussed today.   Review of Systems   Constitutional: Negative for chills and fever.  HENT: Negative for congestion and ear pain.   Eyes: Negative for pain and redness.  Respiratory: Negative for cough and shortness of breath.   Cardiovascular: Negative for chest pain, palpitations and leg swelling.  Gastrointestinal: Negative for abdominal pain, blood in stool, constipation, diarrhea, nausea and vomiting.  Genitourinary: Negative for dysuria.  Musculoskeletal: Negative for falls and myalgias.  Skin: Negative for rash.  Neurological: Negative for dizziness.  Psychiatric/Behavioral: Negative for depression. The patient is not nervous/anxious.       Past Medical History:  Diagnosis Date  . Back pain    improved with gabapentin as of 2015  . Barrett esophagus 09/2003  . Coronary artery calcification seen on CT scan   . Diverticulitis   . Diverticulosis   . GERD (gastroesophageal reflux disease)   . GI AVM (gastrointestinal arteriovenous vascular malformation)   . Hearing loss   . Hemorrhoids   . History of Holter monitoring    a. holter 9/17: NSR, PVCs; no sustained arrhythmia; PVC burden 8%  . History of nuclear stress test    a. Myoview 8/17: Intermediate risk, inferior ischemia, not gated  . History of prostatitis   . History of stroke   . Hyperlipidemia   . Hypertension   . Macular degeneration, wet (HCC)    receiving intra-ocular injections ([facility])  . PUD (peptic ulcer disease)     reports that he has quit smoking. His smoking use included cigarettes. He smoked 0.00 packs per day for 36.00 years. He has never used smokeless tobacco. He reports  that he does not drink alcohol and does not use drugs.   Current Outpatient Medications:  .  AFLIBERCEPT IO, Inject 1 mg into the eye. Right eye every 4 weeks, Disp: , Rfl:  .  Alirocumab (PRALUENT) 150 MG/ML SOAJ, INJECT 150 MG INTO THE SKIN EVERY 14 (FOURTEEN) DAYS., Disp: 6 mL, Rfl: 3 .  Bevacizumab (AVASTIN IV), Place 1 Applicatorful into both eyes See admin  instructions. Left eye every 8 weeks, Disp: , Rfl:  .  cetirizine (ZYRTEC) 10 MG tablet, Take 10 mg by mouth daily as needed for allergies., Disp: , Rfl:  .  clopidogrel (PLAVIX) 75 MG tablet, TAKE 1 TABLET BY MOUTH EVERY DAY, Disp: 90 tablet, Rfl: 3 .  diltiazem (CARDIZEM CD) 180 MG 24 hr capsule, Take 1 capsule (180 mg total) by mouth daily., Disp: 90 capsule, Rfl: 3 .  famotidine (PEPCID) 20 MG tablet, TAKE 1 TABLET (20 MG TOTAL) BY MOUTH 2 (TWO) TIMES DAILY AS NEEDED FOR HEARTBURN OR INDIGESTION., Disp: 180 tablet, Rfl: 3 .  ferrous sulfate 325 (65 FE) MG tablet, Take 1 tablet (325 mg total) by mouth daily with breakfast., Disp: , Rfl:  .  fluticasone (FLONASE) 50 MCG/ACT nasal spray, Place 2 sprays into both nostrils daily as needed for allergies., Disp: 16 g, Rfl: 5 .  gemfibrozil (LOPID) 600 MG tablet, TAKE 1 TABLET BY MOUTH TWICE A DAY, Disp: 180 tablet, Rfl: 3 .  guaiFENesin (ROBITUSSIN) 100 MG/5ML SOLN, Take 5 mLs (100 mg total) by mouth every 6 (six) hours as needed for cough., Disp: , Rfl:  .  hydrochlorothiazide (HYDRODIURIL) 12.5 MG tablet, TAKE 1 TABLET BY MOUTH EVERY DAY, Disp: 90 tablet, Rfl: 1 .  Hypromellose (ARTIFICIAL TEARS OP), Apply 1 drop to eye daily as needed (itchy / watery eyes)., Disp: , Rfl:  .  meclizine (ANTIVERT) 25 MG tablet, Take 0.5 tablets (12.5 mg total) by mouth 3 (three) times daily as needed for dizziness., Disp: , Rfl:  .  Multiple Vitamins-Minerals (OCUVITE ADULT 50+ PO), Take 1 capsule by mouth daily., Disp: , Rfl:  .  nitroGLYCERIN (NITROSTAT) 0.4 MG SL tablet, Place 1 tablet (0.4 mg total) under the tongue every 5 (five) minutes as needed for chest pain (max 3 doses in 15 minutes)., Disp: 25 tablet, Rfl: 0 .  pantoprazole (PROTONIX) 40 MG tablet, TAKE 1 TABLET BY MOUTH EVERY DAY IN THE MORNING, Disp: 90 tablet, Rfl: 1 .  rOPINIRole (REQUIP) 0.5 MG tablet, Take 1 tablet (0.5 mg total) by mouth at bedtime., Disp: 90 tablet, Rfl: 3 .  sucralfate (CARAFATE) 1  g tablet, Take 1 g by mouth daily. , Disp: , Rfl:  .  tamsulosin (FLOMAX) 0.4 MG CAPS capsule, TAKE 1 CAPSULE BY MOUTH EVERY DAY, Disp: 90 capsule, Rfl: 3 .  traMADol (ULTRAM) 50 MG tablet, TAKE ONE TABLET BY MOUTH EVERY EIGHT HOURS AS NEEDED FOR MODERATE TO SEVERE PAIN, Disp: 60 tablet, Rfl: 3 .  vitamin B-12 (CYANOCOBALAMIN) 1000 MCG tablet, Take 2 tablets (2,000 mcg total) by mouth daily., Disp: , Rfl:    Observations/Objective: Height 5' 5.5" (1.664 m).  Physical Exam  Physical Exam Constitutional:      General: The patient is not in acute distress.Talking in complete sentences. Pulmonary:     Effort: Pulmonary effort is normal. No respiratory distress.  Neurological:     Mental Status: The patient is alert and oriented to person, place, and time.  Psychiatric:        Mood and Affect:  Mood normal.        Behavior: Behavior normal.   Assessment and Plan Problem List Items Addressed This Visit    Acute conjunctivitis of both eyes - Primary    Viral vs bacterial. Doubt allergic.  Given unable to examine eye an pt reports extremely copious discharge with redness of conjunctiva and surrounding tissue... will cover with antibiotics.  Amox  x 10 days. Reviewed return precautions and recommend in person visit if not improving in 24-48 hours.       Cough    Neg home test last week for COVID. If not improving, pt should consider PCR COVID test.           I discussed the assessment and treatment plan with the patient. The patient was provided an opportunity to ask questions and all were answered. The patient agreed with the plan and demonstrated an understanding of the instructions.   The patient was advised to call back or seek an in-person evaluation if the symptoms worsen or if the condition fails to improve as anticipated.  Total visit time 20 minutes, > 50% spent counseling and cordinating patients care.     Kerby Nora, MD

## 2020-09-08 NOTE — Assessment & Plan Note (Signed)
Viral vs bacterial. Doubt allergic.  Given unable to examine eye an pt reports extremely copious discharge with redness of conjunctiva and surrounding tissue... will cover with antibiotics.  Amox  x 10 days. Reviewed return precautions and recommend in person visit if not improving in 24-48 hours.

## 2020-09-08 NOTE — Assessment & Plan Note (Signed)
Neg home test last week for COVID. If not improving, pt should consider PCR COVID test.

## 2020-09-17 DIAGNOSIS — H35423 Microcystoid degeneration of retina, bilateral: Secondary | ICD-10-CM | POA: Diagnosis not present

## 2020-09-17 DIAGNOSIS — H43813 Vitreous degeneration, bilateral: Secondary | ICD-10-CM | POA: Diagnosis not present

## 2020-09-17 DIAGNOSIS — H353211 Exudative age-related macular degeneration, right eye, with active choroidal neovascularization: Secondary | ICD-10-CM | POA: Diagnosis not present

## 2020-09-17 DIAGNOSIS — H353231 Exudative age-related macular degeneration, bilateral, with active choroidal neovascularization: Secondary | ICD-10-CM | POA: Diagnosis not present

## 2020-09-20 ENCOUNTER — Inpatient Hospital Stay (HOSPITAL_COMMUNITY): Payer: PPO

## 2020-09-20 ENCOUNTER — Inpatient Hospital Stay (HOSPITAL_COMMUNITY)
Admission: EM | Admit: 2020-09-20 | Discharge: 2020-09-22 | DRG: 690 | Disposition: A | Discharge status: Home / Self Care | Payer: PPO | Attending: Internal Medicine | Admitting: Internal Medicine

## 2020-09-20 ENCOUNTER — Other Ambulatory Visit: Payer: Self-pay

## 2020-09-20 ENCOUNTER — Encounter (HOSPITAL_COMMUNITY): Payer: Self-pay | Admitting: Emergency Medicine

## 2020-09-20 DIAGNOSIS — Z888 Allergy status to other drugs, medicaments and biological substances status: Secondary | ICD-10-CM | POA: Diagnosis not present

## 2020-09-20 DIAGNOSIS — Z800 Family history of malignant neoplasm of digestive organs: Secondary | ICD-10-CM

## 2020-09-20 DIAGNOSIS — Z8711 Personal history of peptic ulcer disease: Secondary | ICD-10-CM

## 2020-09-20 DIAGNOSIS — Z66 Do not resuscitate: Secondary | ICD-10-CM | POA: Diagnosis present

## 2020-09-20 DIAGNOSIS — D509 Iron deficiency anemia, unspecified: Secondary | ICD-10-CM | POA: Diagnosis present

## 2020-09-20 DIAGNOSIS — R531 Weakness: Secondary | ICD-10-CM

## 2020-09-20 DIAGNOSIS — Z87891 Personal history of nicotine dependence: Secondary | ICD-10-CM | POA: Diagnosis not present

## 2020-09-20 DIAGNOSIS — N400 Enlarged prostate without lower urinary tract symptoms: Secondary | ICD-10-CM | POA: Diagnosis present

## 2020-09-20 DIAGNOSIS — E785 Hyperlipidemia, unspecified: Secondary | ICD-10-CM | POA: Diagnosis not present

## 2020-09-20 DIAGNOSIS — Z20822 Contact with and (suspected) exposure to covid-19: Secondary | ICD-10-CM | POA: Diagnosis present

## 2020-09-20 DIAGNOSIS — I517 Cardiomegaly: Secondary | ICD-10-CM | POA: Diagnosis not present

## 2020-09-20 DIAGNOSIS — N390 Urinary tract infection, site not specified: Secondary | ICD-10-CM | POA: Diagnosis not present

## 2020-09-20 DIAGNOSIS — I2510 Atherosclerotic heart disease of native coronary artery without angina pectoris: Secondary | ICD-10-CM | POA: Diagnosis present

## 2020-09-20 DIAGNOSIS — K521 Toxic gastroenteritis and colitis: Secondary | ICD-10-CM | POA: Diagnosis not present

## 2020-09-20 DIAGNOSIS — A419 Sepsis, unspecified organism: Secondary | ICD-10-CM | POA: Diagnosis present

## 2020-09-20 DIAGNOSIS — Z8261 Family history of arthritis: Secondary | ICD-10-CM | POA: Diagnosis not present

## 2020-09-20 DIAGNOSIS — Z83438 Family history of other disorder of lipoprotein metabolism and other lipidemia: Secondary | ICD-10-CM

## 2020-09-20 DIAGNOSIS — Z8042 Family history of malignant neoplasm of prostate: Secondary | ICD-10-CM | POA: Diagnosis not present

## 2020-09-20 DIAGNOSIS — K219 Gastro-esophageal reflux disease without esophagitis: Secondary | ICD-10-CM | POA: Diagnosis present

## 2020-09-20 DIAGNOSIS — Z8673 Personal history of transient ischemic attack (TIA), and cerebral infarction without residual deficits: Secondary | ICD-10-CM | POA: Diagnosis not present

## 2020-09-20 DIAGNOSIS — D72829 Elevated white blood cell count, unspecified: Secondary | ICD-10-CM | POA: Diagnosis not present

## 2020-09-20 DIAGNOSIS — E876 Hypokalemia: Secondary | ICD-10-CM | POA: Diagnosis present

## 2020-09-20 DIAGNOSIS — Z832 Family history of diseases of the blood and blood-forming organs and certain disorders involving the immune mechanism: Secondary | ICD-10-CM

## 2020-09-20 DIAGNOSIS — Z7902 Long term (current) use of antithrombotics/antiplatelets: Secondary | ICD-10-CM

## 2020-09-20 DIAGNOSIS — H9190 Unspecified hearing loss, unspecified ear: Secondary | ICD-10-CM | POA: Diagnosis present

## 2020-09-20 DIAGNOSIS — E782 Mixed hyperlipidemia: Secondary | ICD-10-CM

## 2020-09-20 DIAGNOSIS — Y92239 Unspecified place in hospital as the place of occurrence of the external cause: Secondary | ICD-10-CM | POA: Diagnosis not present

## 2020-09-20 DIAGNOSIS — T3695XA Adverse effect of unspecified systemic antibiotic, initial encounter: Secondary | ICD-10-CM | POA: Diagnosis not present

## 2020-09-20 DIAGNOSIS — Z885 Allergy status to narcotic agent status: Secondary | ICD-10-CM | POA: Diagnosis not present

## 2020-09-20 DIAGNOSIS — E861 Hypovolemia: Secondary | ICD-10-CM | POA: Diagnosis present

## 2020-09-20 DIAGNOSIS — I10 Essential (primary) hypertension: Secondary | ICD-10-CM | POA: Diagnosis not present

## 2020-09-20 DIAGNOSIS — E871 Hypo-osmolality and hyponatremia: Secondary | ICD-10-CM | POA: Diagnosis present

## 2020-09-20 DIAGNOSIS — Z8249 Family history of ischemic heart disease and other diseases of the circulatory system: Secondary | ICD-10-CM

## 2020-09-20 DIAGNOSIS — Z79899 Other long term (current) drug therapy: Secondary | ICD-10-CM

## 2020-09-20 LAB — URINALYSIS, ROUTINE W REFLEX MICROSCOPIC
Bilirubin Urine: NEGATIVE
Glucose, UA: NEGATIVE mg/dL
Ketones, ur: NEGATIVE mg/dL
Nitrite: NEGATIVE
Protein, ur: 30 mg/dL — AB
Specific Gravity, Urine: 1.006 (ref 1.005–1.030)
WBC, UA: >50 WBC/hpf — ABNORMAL HIGH (ref 0–5)
pH: 6 (ref 5.0–8.0)

## 2020-09-20 LAB — COMPREHENSIVE METABOLIC PANEL
ALT: 11 U/L (ref 0–44)
AST: 20 U/L (ref 15–41)
Albumin: 3.3 g/dL — ABNORMAL LOW (ref 3.5–5.0)
Alkaline Phosphatase: 73 U/L (ref 38–126)
Anion gap: 12 (ref 5–15)
BUN: 13 mg/dL (ref 8–23)
CO2: 20 mmol/L — ABNORMAL LOW (ref 22–32)
Calcium: 8.8 mg/dL — ABNORMAL LOW (ref 8.9–10.3)
Chloride: 100 mmol/L (ref 98–111)
Creatinine, Ser: 1.06 mg/dL (ref 0.61–1.24)
GFR, Estimated: >60 mL/min (ref >60)
Glucose, Bld: 112 mg/dL — ABNORMAL HIGH (ref 70–99)
Potassium: 3.3 mmol/L — ABNORMAL LOW (ref 3.5–5.1)
Sodium: 132 mmol/L — ABNORMAL LOW (ref 135–145)
Total Bilirubin: 1.1 mg/dL (ref 0.3–1.2)
Total Protein: 7.5 g/dL (ref 6.5–8.1)

## 2020-09-20 LAB — CBC
HCT: 45.6 % (ref 39.0–52.0)
Hemoglobin: 14.6 g/dL (ref 13.0–17.0)
MCH: 29.1 pg (ref 26.0–34.0)
MCHC: 32 g/dL (ref 30.0–36.0)
MCV: 91 fL (ref 80.0–100.0)
Platelets: 359 10*3/uL (ref 150–400)
RBC: 5.01 MIL/uL (ref 4.22–5.81)
RDW: 12.6 % (ref 11.5–15.5)
WBC: 27.9 10*3/uL — ABNORMAL HIGH (ref 4.0–10.5)
nRBC: 0 % (ref 0.0–0.2)

## 2020-09-20 LAB — LIPASE, BLOOD: Lipase: 20 U/L (ref 11–51)

## 2020-09-20 LAB — LACTIC ACID, PLASMA: Lactic Acid, Venous: 1.6 mmol/L (ref 0.5–1.9)

## 2020-09-20 MED ORDER — CLOPIDOGREL BISULFATE 75 MG PO TABS
75.0000 mg | ORAL_TABLET | Freq: Every day | ORAL | Status: DC
Start: 2020-09-21 — End: 2020-09-22
  Administered 2020-09-21 – 2020-09-22 (×2): 75 mg via ORAL
  Filled 2020-09-20 (×2): qty 1

## 2020-09-20 MED ORDER — DILTIAZEM HCL ER COATED BEADS 180 MG PO CP24
180.0000 mg | ORAL_CAPSULE | Freq: Every day | ORAL | Status: DC
  Administered 2020-09-21 – 2020-09-22 (×2): 180 mg via ORAL
  Filled 2020-09-20 (×2): qty 1

## 2020-09-20 MED ORDER — ACETAMINOPHEN 650 MG RE SUPP: 650.0000 mg | Freq: Four times a day (QID) | RECTAL | Status: DC | PRN

## 2020-09-20 MED ORDER — SUCRALFATE 1 G PO TABS: 1.0000 g | ORAL_TABLET | Freq: Every day | ORAL | Status: DC

## 2020-09-20 MED ORDER — ACETAMINOPHEN 325 MG PO TABS
650.0000 mg | ORAL_TABLET | Freq: Four times a day (QID) | ORAL | Status: DC | PRN
  Administered 2020-09-21: 650 mg via ORAL
  Filled 2020-09-20: qty 2

## 2020-09-20 MED ORDER — ROPINIROLE HCL 1 MG PO TABS
0.5000 mg | ORAL_TABLET | Freq: Every day | ORAL | Status: DC
  Administered 2020-09-21: 0.5 mg via ORAL
  Filled 2020-09-20 (×3): qty 1

## 2020-09-20 MED ORDER — POTASSIUM CHLORIDE CRYS ER 20 MEQ PO TBCR
40.0000 meq | EXTENDED_RELEASE_TABLET | Freq: Once | ORAL | Status: AC
  Administered 2020-09-20: 40 meq via ORAL
  Filled 2020-09-20: qty 2

## 2020-09-20 MED ORDER — TAMSULOSIN HCL 0.4 MG PO CAPS
0.4000 mg | ORAL_CAPSULE | Freq: Every day | ORAL | Status: DC
Start: 2020-09-21 — End: 2020-09-22
  Administered 2020-09-21 – 2020-09-22 (×2): 0.4 mg via ORAL
  Filled 2020-09-20 (×2): qty 1

## 2020-09-20 MED ORDER — FERROUS SULFATE 325 (65 FE) MG PO TABS
325.0000 mg | ORAL_TABLET | Freq: Every day | ORAL | Status: DC
  Administered 2020-09-21 – 2020-09-22 (×2): 325 mg via ORAL
  Filled 2020-09-20 (×2): qty 1

## 2020-09-20 MED ORDER — SODIUM CHLORIDE 0.9 % IV SOLN
1.0000 g | Freq: Once | INTRAVENOUS | Status: AC
  Administered 2020-09-20: 1 g via INTRAVENOUS
  Filled 2020-09-20: qty 10

## 2020-09-20 MED ORDER — PANTOPRAZOLE SODIUM 40 MG PO TBEC
40.0000 mg | DELAYED_RELEASE_TABLET | Freq: Every day | ORAL | Status: DC
  Administered 2020-09-21 – 2020-09-22 (×2): 40 mg via ORAL
  Filled 2020-09-20 (×2): qty 1

## 2020-09-20 MED ORDER — GEMFIBROZIL 600 MG PO TABS
600.0000 mg | ORAL_TABLET | Freq: Two times a day (BID) | ORAL | Status: DC
  Administered 2020-09-21 – 2020-09-22 (×3): 600 mg via ORAL
  Filled 2020-09-20 (×4): qty 1

## 2020-09-20 MED ORDER — SODIUM CHLORIDE 0.9 % IV SOLN
1.0000 g | INTRAVENOUS | Status: DC
  Administered 2020-09-21: 1 g via INTRAVENOUS
  Filled 2020-09-20: qty 1
  Filled 2020-09-20: qty 10

## 2020-09-20 MED ORDER — SODIUM CHLORIDE 0.9 % IV BOLUS
1000.0000 mL | Freq: Once | INTRAVENOUS | Status: AC
  Administered 2020-09-20: 1000 mL via INTRAVENOUS

## 2020-09-20 NOTE — ED Notes (Signed)
This NT was informed by another pt that Mr. Cogdell was walking back from the restroom when he started to stumble. He was able to assist Mr. Corning back to the chair. This NT rechecked his vitals and alerted CN Jaime.

## 2020-09-20 NOTE — ED Triage Notes (Signed)
Pt reports difficulty urinating that started today.  Denies pain but appears to be uncomfortable.  Poor historian.

## 2020-09-20 NOTE — H&P (Signed)
History and Physical    PLEASE NOTE THAT DRAGON DICTATION SOFTWARE WAS USED IN THE CONSTRUCTION OF THIS NOTE.   [name] RWE:[id number] DOB: [birth year] DOA: 09/20/2020  PCP: Tonia Ghent, MD Patient coming from: home   I have personally briefly reviewed patient's old medical records in [place]  Chief Complaint: Generalized weakness  HPI: [name] is a 87 y.o. male with medical history significant for hypertension, hyperlipidemia, GERD, acute ischemic CVA, chronic iron deficiency anemia, BPH, who is admitted to [facility] on 09/20/2020 with sepsis due to complicated urinary tract infection after presenting from home to [facility] Emergency Department complaining of generalized weakness.   The following history is provided by the patient as well as my discussions with the patient's wife, who is present at bedside, as well as my discussions with the emergency department physician, in addition to the chart review.  The patient has been experiencing 2 to 3 days of generalized weakness in the absence of any acute focal weakness, numbness, paresthesias, vertigo, dysphagia, slurred speech, facial droop, change in vision.  As a consequence of this generalized weakness, the patient has experienced associated difficulty in ambulating at home, relative to his baseline mobility at which he requires no assistance with ambulation.  He lives at home with his wife.  Denies any associated fall or other recent trauma.    Over the last 2 days, the patient is also been experiencing dysuria, which is new for him, in the absence of any associated gross hematuria or change in urinary urgency/frequency.  Over that timeframe, he is also experienced diminished oral intake in the setting of diminished appetite over that time.  Not associate with any nausea, vomiting, abdominal pain, diarrhea, melena, or hematochezia.  Denies any associated subjective fever, chills, rigors, or generalized myalgias.   Also denies any recent headache, neck stiffness. Denies any recent chest pain, diaphoresis, palpitations, dizziness, presyncope, or syncope.  The patient and his wife report that the patient had recently been diagnosed with acute sinusitis, in the setting of new onset rhinitis and rhinorrhea, which started about 2 weeks ago, in the absence of any associated significant cough or shortness of breath.  In this context, they report that the patient was started on a 10-day course of amoxicillin starting on 09/08/2020.  Over the course of this antibiotic duration, the patient's rhinitis/rhinorrhea has resolved, and he continues to report no cough or shortness of breath.  No recent travel or known COVID-19 exposures.  He has received 2 doses of the [place] COVID-19 vaccine, as well as [employer] booster in November 2021.    ED Course:  Vital signs in the ED were notable for the following: Temperature max 99.3, heart rate initially noted to be 109, which decreased to 92 following interval IV fluids, as further quantified below; blood pressure 162/78; respiratory rate 20-21; oxygen saturation 96 to 97% on room air.  Labs were notable for the following: CMP was notable for the following: Sodium 132 relative to most recent prior serum sodium value of 138 when checked in April 2021, potassium 3.3, parented 20, anion gap 12, creatinine 1.06, glucose 112.  CBC was notable for the following: Blood cell count of 27,900, hemoglobin 14.6.  Lactic acid 1.6.  Urinalysis was notable for greater than 50 white blood cells, large leukocyte esterase, and no squamous epithelial cells.  Urine sample was subsequently sent for culture blood cultures x2 were collected.  Screening COVID-19 testing was performed in the ED  this evening, with result currently pending.  While in the ED, the following were administered: Rocephin 1 g IV x1, and a 1 L normal saline bolus.      Review of Systems: As per HPI otherwise 10 point review of  systems negative.   Past Medical History:  Diagnosis Date  . Back pain    improved with gabapentin as of 2015  . Barrett esophagus 09/2003  . Coronary artery calcification seen on CT scan   . Diverticulitis   . Diverticulosis   . GERD (gastroesophageal reflux disease)   . GI AVM (gastrointestinal arteriovenous vascular malformation)   . Hearing loss   . Hemorrhoids   . History of Holter monitoring    a. holter 9/17: NSR, PVCs; no sustained arrhythmia; PVC burden 8%  . History of nuclear stress test    a. Myoview 8/17: Intermediate risk, inferior ischemia, not gated  . History of prostatitis   . History of stroke   . Hyperlipidemia   . Hypertension   . Macular degeneration, wet ([place])    receiving intra-ocular injections ([place])  . PUD (peptic ulcer disease)     Past Surgical History:  Procedure Laterality Date  . EP IMPLANTABLE DEVICE N/A 12/24/2015   Procedure: Loop Recorder Insertion;  Surgeon: Thompson Grayer, MD;  Location: [place] [facility];  Service: Cardiovascular;  Laterality: N/A;  . INGUINAL HERNIA REPAIR  2001  . LAPAROSCOPIC APPENDECTOMY N/A 07/07/2014   Procedure: APPENDECTOMY LAPAROSCOPIC;  Surgeon: Donnie Mesa, MD;  Location: [place];  Service: General;  Laterality: N/A;  . TEE WITHOUT CARDIOVERSION N/A 10/28/2015   Procedure: TRANSESOPHAGEAL ECHOCARDIOGRAM (TEE);  Surgeon: Larey Dresser, MD;  Location: [place];  Service: Cardiovascular;  Laterality: N/A;    Social History:  reports that he has quit smoking. His smoking use included cigarettes. He smoked 0.00 packs per day for 36.00 years. He has never used smokeless tobacco. He reports that he does not drink alcohol and does not use drugs.   Allergies  Allergen Reactions  . Angiotensin Receptor Blockers Swelling    Lip and face swelling prev noted  . Atorvastatin Other (See Comments)    REACTION: muscles tense up   . Codeine Nausea And Vomiting  . Lisinopril Swelling and Other (See Comments)    Lips  and face swelling  . Statins Other (See Comments)    Muscle tense up  . Gabapentin     Dizzy sensation    Family History  Problem Relation Age of Onset  . Heart attack Father   . Coronary artery disease Father   . Hypertension Mother   . Pancreatic cancer Mother        Died at 72.  . Cancer Mother        Pancreatic cancer  . Arthritis Sister   . Hyperlipidemia Sister   . Prostate cancer Brother   . Cancer Brother        Bone Cancer secondary to Prostate Cancer  . Bleeding Disorder Son   . Diabetes Neg Hx      Prior to Admission medications   Medication Sig Start Date End Date Taking? Authorizing Provider  AFLIBERCEPT IO Inject 1 mg into the eye. Right eye every 4 weeks    [provider]  Alirocumab (PRALUENT) 150 MG/ML SOAJ INJECT 150 MG INTO THE SKIN EVERY 14 (FOURTEEN) DAYS. 04/06/20   Tonia Ghent, MD  amoxicillin (AMOXIL) 500 MG capsule Take 2 capsules (1,000 mg total) by mouth 2 (two) times  daily. 09/08/20   Jinny Sanders, MD  Bevacizumab (AVASTIN IV) Place 1 Applicatorful into both eyes See admin instructions. Left eye every 8 weeks    [provider]  cetirizine (ZYRTEC) 10 MG tablet Take 10 mg by mouth daily as needed for allergies.    [provider]  clopidogrel (PLAVIX) 75 MG tablet TAKE 1 TABLET BY MOUTH EVERY DAY 09/17/19   Tonia Ghent, MD  diltiazem (CARDIZEM CD) 180 MG 24 hr capsule Take 1 capsule (180 mg total) by mouth daily. 01/24/20   Tonia Ghent, MD  famotidine (PEPCID) 20 MG tablet TAKE 1 TABLET (20 MG TOTAL) BY MOUTH 2 (TWO) TIMES DAILY AS NEEDED FOR HEARTBURN OR INDIGESTION. 12/07/19   Tonia Ghent, MD  ferrous sulfate 325 (65 FE) MG tablet Take 1 tablet (325 mg total) by mouth daily with breakfast. 04/05/17   Tonia Ghent, MD  fluticasone [facility]) 50 MCG/ACT nasal spray Place 2 sprays into both nostrils daily as needed for allergies. 08/21/15   Tonia Ghent, MD  gemfibrozil (LOPID) 600 MG tablet TAKE 1  TABLET BY MOUTH TWICE A DAY 12/20/19   Tonia Ghent, MD  guaiFENesin (ROBITUSSIN) 100 MG/5ML SOLN Take 5 mLs (100 mg total) by mouth every 6 (six) hours as needed for cough. 02/01/17   Tonia Ghent, MD  hydrochlorothiazide (HYDRODIURIL) 12.5 MG tablet TAKE 1 TABLET BY MOUTH EVERY DAY 06/25/20   Tonia Ghent, MD  Hypromellose (ARTIFICIAL TEARS OP) Apply 1 drop to eye daily as needed (itchy / watery eyes).    [provider]  meclizine (ANTIVERT) 25 MG tablet Take 0.5 tablets (12.5 mg total) by mouth 3 (three) times daily as needed for dizziness. 12/03/15   Tonia Ghent, MD  Multiple Vitamins-Minerals (OCUVITE ADULT 50+ PO) Take 1 capsule by mouth daily.    [provider]  nitroGLYCERIN (NITROSTAT) 0.4 MG SL tablet Place 1 tablet (0.4 mg total) under the tongue every 5 (five) minutes as needed for chest pain (max 3 doses in 15 minutes). 03/26/18   Venia Carbon, MD  pantoprazole (PROTONIX) 40 MG tablet TAKE 1 TABLET BY MOUTH EVERY DAY IN THE MORNING 05/14/20   Tonia Ghent, MD  rOPINIRole (REQUIP) 0.5 MG tablet Take 1 tablet (0.5 mg total) by mouth at bedtime. 12/09/19   Tonia Ghent, MD  sucralfate (CARAFATE) 1 g tablet Take 1 g by mouth daily.     [provider]  tamsulosin (FLOMAX) 0.4 MG CAPS capsule TAKE 1 CAPSULE BY MOUTH EVERY DAY 02/19/19   Tonia Ghent, MD  traMADol (ULTRAM) 50 MG tablet TAKE ONE TABLET BY MOUTH EVERY EIGHT HOURS AS NEEDED FOR MODERATE TO SEVERE PAIN 08/31/20   Tonia Ghent, MD  vitamin B-12 (CYANOCOBALAMIN) 1000 MCG tablet Take 2 tablets (2,000 mcg total) by mouth daily. 01/28/16   Tonia Ghent, MD     Objective    Physical Exam: Vitals:   09/20/20 1522 09/20/20 1724 09/20/20 1915  BP: (!) 176/102 (!) 162/78 (!) 174/79  Pulse: (!) 109 (!) 107 92  Resp: 20 20 (!) 21  Temp: 99.3 F (37.4 C)    TempSrc: Oral    SpO2: 96%  97%    General: appears to be stated age; alert, oriented Skin: warm, dry, no  rash Head:  AT/[place] Mouth:  Oral mucosa membranes appear moist, normal dentition Neck: supple; trachea midline Heart:  RRR; did not appreciate any M/R/G Lungs: CTAB, did  not appreciate any wheezes, rales, or rhonchi Abdomen: + BS; soft, ND, NT Vascular: 2+ pedal pulses b/l; 2+ radial pulses b/l Extremities: no peripheral edema, no muscle wasting Neuro: strength and sensation intact in upper and lower extremities b/l   Labs on Admission: I have personally reviewed following labs and imaging studies  CBC: Recent Labs  Lab 09/20/20 1535  WBC 27.9*  HGB 14.6  HCT 45.6  MCV 91.0  PLT 297   Basic Metabolic Panel: Recent Labs  Lab 09/20/20 1535  NA 132*  K 3.3*  CL 100  CO2 20*  GLUCOSE 112*  BUN 13  CREATININE 1.06  CALCIUM 8.8*   GFR: CrCl cannot be calculated (Unknown ideal weight.). Liver Function Tests: Recent Labs  Lab 09/20/20 1535  AST 20  ALT 11  ALKPHOS 73  BILITOT 1.1  PROT 7.5  ALBUMIN 3.3*   Recent Labs  Lab 09/20/20 1535  LIPASE 20   No results for input(s): AMMONIA in the last 168 hours. Coagulation Profile: No results for input(s): INR, PROTIME in the last 168 hours. Cardiac Enzymes: No results for input(s): CKTOTAL, CKMB, CKMBINDEX, TROPONINI in the last 168 hours. BNP (last 3 results) No results for input(s): PROBNP in the last 8760 hours. HbA1C: No results for input(s): HGBA1C in the last 72 hours. CBG: No results for input(s): GLUCAP in the last 168 hours. Lipid Profile: No results for input(s): CHOL, HDL, LDLCALC, TRIG, CHOLHDL, LDLDIRECT in the last 72 hours. Thyroid Function Tests: No results for input(s): TSH, T4TOTAL, FREET4, T3FREE, THYROIDAB in the last 72 hours. Anemia Panel: No results for input(s): VITAMINB12, FOLATE, FERRITIN, TIBC, IRON, RETICCTPCT in the last 72 hours. Urine analysis:    Component Value Date/Time   COLORURINE YELLOW 09/20/2020 1615   APPEARANCEUR HAZY (A) 09/20/2020 1615   LABSPEC 1.006 09/20/2020  1615   PHURINE 6.0 09/20/2020 1615   GLUCOSEU NEGATIVE 09/20/2020 1615   GLUCOSEU NEGATIVE 07/04/2017 1215   HGBUR LARGE (A) 09/20/2020 1615   BILIRUBINUR NEGATIVE 09/20/2020 1615   BILIRUBINUR Neg 08/19/2019 1246   KETONESUR NEGATIVE 09/20/2020 1615   PROTEINUR 30 (A) 09/20/2020 1615   UROBILINOGEN 0.2 08/19/2019 1246   UROBILINOGEN 0.2 07/04/2017 1215   NITRITE NEGATIVE 09/20/2020 1615   LEUKOCYTESUR LARGE (A) 09/20/2020 1615    Radiological Exams on Admission: No results found.    Assessment/Plan   YAACOV KOZIOL is a 87 y.o. male with medical history significant for hypertension, hyperlipidemia, GERD, acute ischemic CVA, chronic iron deficiency anemia, BPH, who is admitted to [facility] on 09/20/2020 with sepsis due to complicated urinary tract infection after presenting from home to [facility] Emergency Department complaining of generalized weakness.    Principal Problem:   Complicated UTI (urinary tract infection) Active Problems:   Essential hypertension   Hypokalemia   HLD (hyperlipidemia)   Sepsis (HCC)   Leukocytosis   Generalized weakness   Acute hyponatremia    #) Sepsis due to complicated urinary tract infection: Diagnosis on the basis of 2 to 3 days of new onset dysuria associated with new onset generalized weakness, with SIRS criteria met via presenting leukocytosis as well as tachypnea.  Of note, patient sepsis does not meet criteria to be considered severe in nature in the absence of any evidence of associated endorgan damage, including a nonelevated presenting lactic acid level of 1.6.  Initially, presentation has not been associated with any hypotension.  Consequently, there is currently no indication for administration of a 30 mL/kg IV fluid bolus.  Blood cultures x2 as well as urine culture were collected in the emergency department this evening followed by initiation of Rocephin.  At this time, no evidence of additional underlying infectious process.   However, given recent complaint of rhinitis/rhinorrhea, will check chest x-ray to ensure that there is no latent secondary bacterial pneumonia potentially contributing to the presenting sepsis.  Of note, screening COVID-19 test performed in the ED this evening, with result currently pending. Of note, by definition meets criteria to be considered a complicated urinary tract infection with basis of patient's gender.  Plan: Continue Rocephin.  Monitor for results of blood and urine cultures collected in the ED today.  Repeat CBC with differential in the morning.  Check chest x-ray, as above.  Monitor for result of screening COVID-19 test performed in the ED this evening.     #) Hypokalemia: Presenting labs reflect mildly suppressed potassium of 3.3.  Suspect this is on the basis of recent decline in oral intake, as further described above.   Plan: Potassium chloride 40 mEq p.o. x1.  Add on serum magnesium level.  Recheck BMP and serum mag levels in the morning.     #) Acute hypo-osmolar hypovolemic hyponatremia: Presenting serum sodium level noted to be 132, which appears to be acutely low relative to most recent prior value 138 when checked in April 2021, and requires no adjustment in the setting of normoglycemic laboratory findings this evening.  In the setting of reported recent decline in oral intake, suspect an contribution from hypovolemia as well as a potential pharmacologic contribution given outpatient HCTZ.  The patient has received a 1 L bolus of normal saline in the ED this evening, is currently normotensive, and able to eat and drink.  Consequently, will refrain from additional IV fluids at this time, pending further laboratory work-up for presenting acute hyponatremia, as further described low.   Plan: monitor strict I's and O's and daily weights.  random urine sodium, urine osmolality. refrain from additional IVF's pending these results, as above. Check serum osmolality to confirm  suspected hypoosmolar etiology. Repeat BMP in the morning. Check TSH.  Hold home HCTZ.      #) Generalized weakness: the patient reports 2-3 duration of generalized weakness, in the absence of any evidence of acute focal neurologic deficits, including no evidence of acute focal weakness. Consequently, acute ischemic CVA is felt to be less likely at this time. Does not appear to be a/w any acute myalgias. Suspect contribution from physiologic stress stemming from presenting sepsis due to urinary tract infection as well as additional contributions from dehydration as well as resultant acute hyponatremia, as above.    Plan: work-up and management of presenting sepsis due to UTI, as described above. Physical therapy consult has been placed for tomorrow morning.  Monitor strict I's and O's and daily weights.  Check TSH.  Check vitamin B12 level.  Monitor for results of COVID-19 screen performed this evening.     #) Essential hypertension: A documented history of such, for which he is on diltiazem as well as HCTZ as an outpatient.  Of note he is also on tamsulosin in the setting of a history of BPH.  Presenting blood pressure noted to be mildly elevated with systolic values in the 149F, in the absence of any associated hypotensive findings.  In the setting presenting acute hyponatremia, will hold home HCTZ for now, pending further evaluation of such..   Plan: Continue home diltiazem.  Hold home HCTZ, as above.  Close monitoring  of ensuing blood pressures via routine vital signs.      #) Hyperlipidemia: On gemfibrozil as an outpatient.  Plan: Continue home gemfibrozil.      #) GERD: On Protonix as an outpatient.  Plan: Continue home PPI.      #) Benign prostatic hyperplasia: Documented history of such for which the patient is on tamsulosin at home.  In the setting of presenting urinary tract infection, may represent risk factor for development of such in the setting of associated  urinary stasis.  Plan: Monitor strict I's and O's and daily weights.  Continue tamsulosin.  Repeat BMP in the morning.    DVT prophylaxis: scd's  Code Status: DNR/DNI per my discussions with the patient as well as his wife this evening. Family Communication: Case was discussed with the patient's wife, who was present at bedside. Disposition Plan: Per Rounding Team Consults called: none  Admission status: inpatient; med-tele  COVID-19 Vaccination status: Pfizer vaccine x2 doses followed by [employer] booster in November 2021.    Of note, this patient was added by me to the following Admit List/Treatment Team:  mcadmits     PLEASE NOTE THAT [employer]AGON DICTATION SOFTWARE WAS USED IN THE CONSTRUCTION OF THIS NOTE.   Rhetta Mura DO [facility] Pager [phone] From [place]  09/20/2020, 9:30 PM

## 2020-09-20 NOTE — ED Notes (Signed)
Pt cleaned of urinary incontinence. Linens changed.  

## 2020-09-20 NOTE — ED Provider Notes (Signed)
[facility] EMERGENCY DEPARTMENT Provider Note   CSN: [id number] Arrival date & time: 09/20/20  1451     History Chief Complaint  Patient presents with  . Urinary Retention    [name] is a 87 y.o. male.  HPI He presents for evaluation of general weakness, difficulty walking, and urinary incontinence, for the last 24 hours.  He was able to come here by private vehicle with assistance from family member.  He denies dysuria, fever, chills, focal weakness or paresthesia.  He has not eaten much today because "I am not hungry."  He is apparently taking his usual medications.  No other recent illnesses.  No chronic problems with urination.  No known sick contacts.  There are no other known modifying factors.    Past Medical History:  Diagnosis Date  . Back pain    improved with gabapentin as of 2015  . Barrett esophagus 09/2003  . Coronary artery calcification seen on CT scan   . Diverticulitis   . Diverticulosis   . GERD (gastroesophageal reflux disease)   . GI AVM (gastrointestinal arteriovenous vascular malformation)   . Hearing loss   . Hemorrhoids   . History of Holter monitoring    a. holter 9/17: NSR, PVCs; no sustained arrhythmia; PVC burden 8%  . History of nuclear stress test    a. Myoview 8/17: Intermediate risk, inferior ischemia, not gated  . History of prostatitis   . History of stroke   . Hyperlipidemia   . Hypertension   . Macular degeneration, wet (HCC)    receiving intra-ocular injections ([place])  . PUD (peptic ulcer disease)     Patient Active Problem List   Diagnosis Date Noted  . Acute conjunctivitis of both eyes 09/08/2020  . Healthcare maintenance 01/27/2020  . Unsteady 01/20/2019  . Chest wall pain 05/27/2018  . SI (sacroiliac) pain 03/26/2018  . Testicle pain 08/06/2017  . Dysuria 07/04/2017  . Coronary artery disease involving native coronary artery of native heart without angina pectoris 05/04/2017  . Prostatitis  03/28/2017  . Pulse irregularity 03/03/2017  . Cough 02/03/2017  . Abnormal ultrasound 01/11/2017  . Pain in joint, shoulder region 03/30/2016  . Lower back pain 03/30/2016  . Tennis elbow 03/30/2016  . Iron deficiency 03/07/2016  . Restless leg syndrome 03/04/2016  . Atypical chest pain 02/26/2016  . Leg pain 01/28/2016  . Vitamin B 12 deficiency 01/28/2016  . Knee pain 12/06/2015  . Fatigue 12/06/2015  . 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/27/2015  . Palpitations 04/03/2015  . HLD (hyperlipidemia) 04/03/2015  . Cerebral infarction due to stenosis of cerebral artery (HCC) 04/03/2015  . History of stroke   . Advance care planning 08/21/2014  . Memory change 08/21/2014  . Acute diverticulitis 08/16/2014  . Ectatic abdominal aorta (HCC) 08/16/2014  . Vomiting   . COPD exacerbation (HCC)   . Lower abdominal pain   . GERD (gastroesophageal reflux disease) 05/29/2014  . Left flank pain, chronic 01/22/2013  . Hypokalemia 12/28/2012  . Angioedema of lips 12/28/2012  . Medicare annual wellness visit, subsequent 08/15/2011  . Angiodysplasia of intestine 11/30/2009  . Hearing loss 07/27/2009  . DYSPHAGIA 08/21/2008  . Barrett's esophagus 05/16/2008  . TEMPOROMANDIBULAR JOINT DISORDER 10/16/2007  . UMBILICAL HERNIA 09/22/2007  . Essential hypertension 05/15/2007  . HEMORRHOIDS 05/15/2007    Past Surgical History:  Procedure Laterality Date  . EP IMPLANTABLE DEVICE N/A 12/24/2015   Procedure: Loop Recorder Insertion;  Surgeon: Hillis Range, MD;  Location: [facility] INVASIVE CV  LAB;  Service: Cardiovascular;  Laterality: N/A;  . INGUINAL HERNIA REPAIR  2001  . LAPAROSCOPIC APPENDECTOMY N/A 07/07/2014   Procedure: APPENDECTOMY LAPAROSCOPIC;  Surgeon: Manus Rudd, MD;  Location: [facility] OR;  Service: General;  Laterality: N/A;  . TEE WITHOUT CARDIOVERSION N/A 10/28/2015   Procedure: TRANSESOPHAGEAL ECHOCARDIOGRAM (TEE);  Surgeon: Laurey Morale, MD;  Location: [facility] ENDOSCOPY;  Service: Cardiovascular;  Laterality:  N/A;       Family History  Problem Relation Age of Onset  . Heart attack Father   . Coronary artery disease Father   . Hypertension Mother   . Pancreatic cancer Mother        Died at 48.  . Cancer Mother        Pancreatic cancer  . Arthritis Sister   . Hyperlipidemia Sister   . Prostate cancer Brother   . Cancer Brother        Bone Cancer secondary to Prostate Cancer  . Bleeding Disorder Son   . Diabetes Neg Hx     Social History   Tobacco Use  . Smoking status: Former Smoker    Packs/day: 0.00    Years: 36.00    Pack years: 0.00    Types: Cigarettes  . Smokeless tobacco: Never Used  . Tobacco comment: Quit in 1983  Vaping Use  . Vaping Use: Never used  Substance Use Topics  . Alcohol use: No    Alcohol/week: 0.0 standard drinks  . Drug use: No    Home Medications Prior to Admission medications   Medication Sig Start Date End Date Taking? Authorizing Provider  AFLIBERCEPT IO Inject 1 mg into the eye. Right eye every 4 weeks    [provider]  Alirocumab (PRALUENT) 150 MG/ML SOAJ INJECT 150 MG INTO THE SKIN EVERY 14 (FOURTEEN) DAYS. 04/06/20   Joaquim Nam, MD  amoxicillin (AMOXIL) 500 MG capsule Take 2 capsules (1,000 mg total) by mouth 2 (two) times daily. 09/08/20   Bedsole, Amy E, MD  Bevacizumab (AVASTIN IV) Place 1 Applicatorful into both eyes See admin instructions. Left eye every 8 weeks    [provider]  cetirizine (ZYRTEC) 10 MG tablet Take 10 mg by mouth daily as needed for allergies.    [provider]  clopidogrel (PLAVIX) 75 MG tablet TAKE 1 TABLET BY MOUTH EVERY DAY 09/17/19   Joaquim Nam, MD  diltiazem (CARDIZEM CD) 180 MG 24 hr capsule Take 1 capsule (180 mg total) by mouth daily. 01/24/20   Joaquim Nam, MD  famotidine (PEPCID) 20 MG tablet TAKE 1 TABLET (20 MG TOTAL) BY MOUTH 2 (TWO) TIMES DAILY AS NEEDED FOR HEARTBURN OR INDIGESTION. 12/07/19   Joaquim Nam, MD  ferrous sulfate 325 (65 FE) MG tablet Take 1  tablet (325 mg total) by mouth daily with breakfast. 04/05/17   Joaquim Nam, MD  fluticasone [facility]) 50 MCG/ACT nasal spray Place 2 sprays into both nostrils daily as needed for allergies. 08/21/15   Joaquim Nam, MD  gemfibrozil (LOPID) 600 MG tablet TAKE 1 TABLET BY MOUTH TWICE A DAY 12/20/19   Joaquim Nam, MD  guaiFENesin (ROBITUSSIN) 100 MG/5ML SOLN Take 5 mLs (100 mg total) by mouth every 6 (six) hours as needed for cough. 02/01/17   Joaquim Nam, MD  hydrochlorothiazide (HYDRODIURIL) 12.5 MG tablet TAKE 1 TABLET BY MOUTH EVERY DAY 06/25/20   Joaquim Nam, MD  Hypromellose (ARTIFICIAL TEARS OP) Apply 1 drop to eye daily as needed (itchy /  watery eyes).    [provider]  meclizine (ANTIVERT) 25 MG tablet Take 0.5 tablets (12.5 mg total) by mouth 3 (three) times daily as needed for dizziness. 12/03/15   Joaquim Nam, MD  Multiple Vitamins-Minerals (OCUVITE ADULT 50+ PO) Take 1 capsule by mouth daily.    [provider]  nitroGLYCERIN (NITROSTAT) 0.4 MG SL tablet Place 1 tablet (0.4 mg total) under the tongue every 5 (five) minutes as needed for chest pain (max 3 doses in 15 minutes). 03/26/18   Karie Schwalbe, MD  pantoprazole (PROTONIX) 40 MG tablet TAKE 1 TABLET BY MOUTH EVERY DAY IN THE MORNING 05/14/20   Joaquim Nam, MD  rOPINIRole (REQUIP) 0.5 MG tablet Take 1 tablet (0.5 mg total) by mouth at bedtime. 12/09/19   Joaquim Nam, MD  sucralfate (CARAFATE) 1 g tablet Take 1 g by mouth daily.     [provider]  tamsulosin (FLOMAX) 0.4 MG CAPS capsule TAKE 1 CAPSULE BY MOUTH EVERY DAY 02/19/19   Joaquim Nam, MD  traMADol (ULTRAM) 50 MG tablet TAKE ONE TABLET BY MOUTH EVERY EIGHT HOURS AS NEEDED FOR MODERATE TO SEVERE PAIN 08/31/20   Joaquim Nam, MD  vitamin B-12 (CYANOCOBALAMIN) 1000 MCG tablet Take 2 tablets (2,000 mcg total) by mouth daily. 01/28/16   Joaquim Nam, MD    Allergies    Angiotensin receptor blockers,  Atorvastatin, Codeine, Lisinopril, Statins, and Gabapentin  Review of Systems   Review of Systems  All other systems reviewed and are negative.   Physical Exam Updated Vital Signs BP (!) 174/79   Pulse 92   Temp 99.3 F (37.4 C) (Oral)   Resp (!) 21   SpO2 97%   Physical Exam Vitals and nursing note reviewed.  Constitutional:      General: He is not in acute distress.    Appearance: He is well-developed and well-nourished. He is ill-appearing. He is not toxic-appearing or diaphoretic.  HENT:     Head: Normocephalic and atraumatic.     Right Ear: External ear normal.     Left Ear: External ear normal.  Eyes:     Extraocular Movements: EOM normal.     Conjunctiva/sclera: Conjunctivae normal.     Pupils: Pupils are equal, round, and reactive to light.  Neck:     Trachea: Phonation normal.  Cardiovascular:     Rate and Rhythm: Normal rate and regular rhythm.     Heart sounds: Normal heart sounds.  Pulmonary:     Effort: Pulmonary effort is normal. No respiratory distress.     Breath sounds: Normal breath sounds. No stridor. No wheezing or rhonchi.  Chest:     Chest wall: No bony tenderness.  Abdominal:     General: There is no distension.     Palpations: Abdomen is soft. There is no mass.     Tenderness: There is no abdominal tenderness.     Hernia: No hernia is present.  Musculoskeletal:        General: Normal range of motion.     Cervical back: Normal range of motion and neck supple.  Skin:    General: Skin is warm, dry and intact.  Neurological:     Mental Status: He is alert and oriented to person, place, and time.     Cranial Nerves: No cranial nerve deficit.     Sensory: No sensory deficit.     Motor: No abnormal muscle tone.     Coordination: Coordination normal.  Psychiatric:        Mood and Affect: Mood and affect and mood normal.        Behavior: Behavior normal.        Thought Content: Thought content normal.        Judgment: Judgment normal.      ED Results / Procedures / Treatments   Labs (all labs ordered are listed, but only abnormal results are displayed) Labs Reviewed  COMPREHENSIVE METABOLIC PANEL - Abnormal; Notable for the following components:      Result Value   Sodium 132 (*)    Potassium 3.3 (*)    CO2 20 (*)    Glucose, Bld 112 (*)    Calcium 8.8 (*)    Albumin 3.3 (*)    All other components within normal limits  CBC - Abnormal; Notable for the following components:   WBC 27.9 (*)    All other components within normal limits  URINALYSIS, ROUTINE W REFLEX MICROSCOPIC - Abnormal; Notable for the following components:   APPearance HAZY (*)    Hgb urine dipstick LARGE (*)    Protein, ur 30 (*)    Leukocytes,Ua LARGE (*)    WBC, UA >50 (*)    Bacteria, UA RARE (*)    All other components within normal limits  CULTURE, BLOOD (ROUTINE X 2)  CULTURE, BLOOD (ROUTINE X 2)  URINE CULTURE  SARS CORONAVIRUS 2 (TAT 6-24 HRS)  LIPASE, BLOOD  LACTIC ACID, PLASMA  LACTIC ACID, PLASMA    EKG None  Radiology No results found.  Procedures Procedures   Medications Ordered in ED Medications  sodium chloride 0.9 % bolus 1,000 mL (1,000 mLs Intravenous New Bag/Given 09/20/20 1953)  cefTRIAXone (ROCEPHIN) 1 g in sodium chloride 0.9 % 100 mL IVPB (1 g Intravenous New Bag/Given 09/20/20 1954)    ED Course  I have reviewed the triage vital signs and the nursing notes.  Pertinent labs & imaging results that were available during my care of the patient were reviewed by me and considered in my medical decision making (see chart for details).    MDM Rules/Calculators/A&P                           Patient Vitals for the past 24 hrs:  BP Temp Temp src Pulse Resp SpO2  09/20/20 1915 (!) 174/79 -- -- 92 (!) 21 97 %  09/20/20 1724 (!) 162/78 -- -- (!) 107 20 --  09/20/20 1522 (!) 176/102 99.3 F (37.4 C) Oral (!) 109 20 96 %    8:31 PM Reevaluation with update and discussion. After initial assessment and  treatment, an updated evaluation reveals he states his headache is better and he feels overall better. Patient and family updated on findings and plan. Mancel BaleElliott Salisa Broz   Medical Decision Making:  This patient is presenting for evaluation of general weakness, which does require a range of treatment options, and is a complaint that involves a high risk of morbidity and mortality. The differential diagnoses include acute infection, metabolic disorder, cardiac disorder and dysfunction. I decided to review old records, and in summary elderly male, from home presenting with nonspecific symptoms, requiring evaluation.  I did not require additional historical information from anyone.  Clinical Laboratory Tests Ordered, included Lipase, CBC, Metabolic panel, Urinalysis and Lactate, blood culture, Covid test. Review indicates normal except sodium low, potassium low, CO2 low, glucose high, calcium low, albumin low,, high, urinalysis abnormal, consistent with infection.  Critical Interventions-medical evaluation, laboratory testing, IV fluids, observation, reassessment  After These Interventions, the Patient was reevaluated and was found with signs and symptoms of urinary tract infection. Hemodynamically stable. Delay in testing and treatment, labs and medication ordered, not obtained and administered for several hours. Blood culture obtained, Rocephin given, lactate sent and returned normal. Urine culture pending. Patient has been ill with 24 hours of weakness, and inability to eat. He'll benefit from overnight observation stabilization for urinary tract infection.  CRITICAL CARE-no Performed by: Mancel Bale  Nursing Notes Reviewed/ Care Coordinated Applicable Imaging Reviewed Interpretation of Laboratory Data incorporated into ED treatment   8:49 PM-Consult complete with hospitalist. Patient case explained and discussed. He agrees to admit patient for further evaluation and treatment. Call ended at  8:58 PM  Final Clinical Impression(s) / ED Diagnoses Final diagnoses:  Urinary tract infection without hematuria, site unspecified    Rx / DC Orders ED Discharge Orders    None       Mancel Bale, MD 09/20/20 2103

## 2020-09-21 ENCOUNTER — Telehealth: Payer: Self-pay

## 2020-09-21 DIAGNOSIS — N390 Urinary tract infection, site not specified: Principal | ICD-10-CM

## 2020-09-21 LAB — SODIUM, URINE, RANDOM: Sodium, Ur: <10 mmol/L

## 2020-09-21 LAB — BASIC METABOLIC PANEL
Anion gap: 13 (ref 5–15)
BUN: 15 mg/dL (ref 8–23)
CO2: 18 mmol/L — ABNORMAL LOW (ref 22–32)
Calcium: 8.5 mg/dL — ABNORMAL LOW (ref 8.9–10.3)
Chloride: 105 mmol/L (ref 98–111)
Creatinine, Ser: 0.97 mg/dL (ref 0.61–1.24)
GFR, Estimated: >60 mL/min (ref >60)
Glucose, Bld: 96 mg/dL (ref 70–99)
Potassium: 3.7 mmol/L (ref 3.5–5.1)
Sodium: 136 mmol/L (ref 135–145)

## 2020-09-21 LAB — CBC WITH DIFFERENTIAL/PLATELET
Abs Immature Granulocytes: 0.12 10*3/uL — ABNORMAL HIGH (ref 0.00–0.07)
Basophils Absolute: 0.1 10*3/uL (ref 0.0–0.1)
Basophils Relative: 0 %
Eosinophils Absolute: 0 10*3/uL (ref 0.0–0.5)
Eosinophils Relative: 0 %
HCT: 41 % (ref 39.0–52.0)
Hemoglobin: 13.9 g/dL (ref 13.0–17.0)
Immature Granulocytes: 1 %
Lymphocytes Relative: 4 %
Lymphs Abs: 0.9 10*3/uL (ref 0.7–4.0)
MCH: 30.5 pg (ref 26.0–34.0)
MCHC: 33.9 g/dL (ref 30.0–36.0)
MCV: 89.9 fL (ref 80.0–100.0)
Monocytes Absolute: 1.4 10*3/uL — ABNORMAL HIGH (ref 0.1–1.0)
Monocytes Relative: 7 %
Neutro Abs: 18.6 10*3/uL — ABNORMAL HIGH (ref 1.7–7.7)
Neutrophils Relative %: 88 %
Platelets: 306 10*3/uL (ref 150–400)
RBC: 4.56 MIL/uL (ref 4.22–5.81)
RDW: 12.5 % (ref 11.5–15.5)
WBC: 21 10*3/uL — ABNORMAL HIGH (ref 4.0–10.5)
nRBC: 0 % (ref 0.0–0.2)

## 2020-09-21 LAB — TSH: TSH: 2.225 u[IU]/mL (ref 0.350–4.500)

## 2020-09-21 LAB — C DIFFICILE QUICK SCREEN W PCR REFLEX
C Diff antigen: NEGATIVE
C Diff interpretation: NOT DETECTED
C Diff toxin: NEGATIVE

## 2020-09-21 LAB — OSMOLALITY, URINE: Osmolality, Ur: 224 mOsm/kg — ABNORMAL LOW (ref 300–900)

## 2020-09-21 LAB — MAGNESIUM: Magnesium: 2 mg/dL (ref 1.7–2.4)

## 2020-09-21 LAB — SARS CORONAVIRUS 2 (TAT 6-24 HRS): SARS Coronavirus 2: NEGATIVE

## 2020-09-21 MED ORDER — SACCHAROMYCES BOULARDII 250 MG PO CAPS
250.0000 mg | ORAL_CAPSULE | Freq: Two times a day (BID) | ORAL | Status: DC
  Administered 2020-09-21 – 2020-09-22 (×3): 250 mg via ORAL
  Filled 2020-09-21 (×4): qty 1

## 2020-09-21 MED ORDER — LOPERAMIDE HCL 2 MG PO CAPS
2.0000 mg | ORAL_CAPSULE | ORAL | Status: DC | PRN
  Administered 2020-09-21 (×2): 2 mg via ORAL
  Filled 2020-09-21 (×2): qty 1

## 2020-09-21 MED ORDER — LACTATED RINGERS IV SOLN: INTRAVENOUS | Status: DC

## 2020-09-21 MED ORDER — TRAMADOL HCL 50 MG PO TABS
50.0000 mg | ORAL_TABLET | Freq: Once | ORAL | Status: AC
  Administered 2020-09-21: 50 mg via ORAL
  Filled 2020-09-21: qty 1

## 2020-09-21 NOTE — Telephone Encounter (Signed)
Per chart review tab pt seen at [facility] ED on 09/20/20 and was admitted to [facility] hospital.

## 2020-09-21 NOTE — Telephone Encounter (Signed)
[facility] Night - Client TELEPHONE ADVICE RECORD AccessNurse Patient Name: [name] Gender: Male DOB: 1926-02-23 Age: 87 Y 11 M 3 D Return Phone Number: [phone] (Primary) Address[place] [address] Client [facility] Night - Client Client Site [facility] - Night Physician Raechel Ache - MD Contact Type Call Who Is Calling Patient / Member / Family / Caregiver Call Type Triage / Clinical Caller Name Brinton Brandel Relationship To Patient Spouse Return Phone Number [phone] (Primary) Chief Complaint Urinary Incontinence Reason for Call Symptomatic / Request for Health Information Initial Comment Caller states her husband is having an use with urinary incontinence for the last 24 hours. Caller notes he has been weak and having chills. GOTO Facility Not Listed Son will be driving to ER instead of calling EMS Translation No Nurse Assessment Nurse: Emmit Pomfret, RN, Catrina Date/Time (Eastern Time): 09/20/2020 2:00:05 PM Confirm and document reason for call. If symptomatic, describe symptoms. ---Caller states her husband urinary incontinence for the last 24 hours. Caller notes he has been weak and having chills and dysuria. Denies further symptoms. Does the patient have any new or worsening symptoms? ---Yes Will a triage be completed? ---Yes Related visit to physician within the last 2 weeks? ---No Does the PT have any chronic conditions? (i.e. diabetes, asthma, this includes High risk factors for pregnancy, etc.) ---Yes List chronic conditions. ---HTN Is this a behavioral health or substance abuse call? ---No Guidelines Guideline Title Affirmed Question Affirmed Notes Nurse Date/Time Lamount Cohen Time) Urination Pain - Male Shock suspected (e.g., cold/pale/clammy skin, too weak to stand, low BP, rapid pulse) Guiro, RN, Catrina 09/20/2020 2:02:13 PM Disp. Time Lamount Cohen Time) Disposition  Final User 09/20/2020 1:41:51 PM Attempt made - line busy Emmit Pomfret, RN, Catrina 09/20/2020 2:09:01 PM 911 Outcome Documentation Guiro, RN, Catrina PLEASE NOTE: All timestamps contained within this report are represented as [place] Standard Time. CONFIDENTIALTY NOTICE: This fax transmission is intended only for the addressee. It contains information that is legally privileged, confidential or otherwise protected from use or disclosure. If you are not the intended recipient, you are strictly prohibited from reviewing, disclosing, copying using or disseminating any of this information or taking any action in reliance on or regarding this information. If you have received this fax in error, please notify us immediately by telephone so that we can arrange for its return to [place]. Phone: [phone], Toll-Free: [phone], Fax: [phone] Page: 2 of 2 Call Id: [id number] Disp. Time Lamount Cohen Time) Disposition Final User Reason: States son will take to ER instead of calling EMS 09/20/2020 2:06:30 PM Call EMS 911 Now Yes Guiro, RN, [employer] Caller Disagree/Comply Comply Caller Understands Yes PreDisposition Did not know what to do Care Advice Given Per Guideline CALL EMS 911 NOW: * Triager Discretion: I'll call you back in a few minutes to be sure you were able to reach them. FIRST AID - LIE DOWN FOR SHOCK: * Lie down with feet elevated. * Reason: Treatment for shock. CARE ADVICE given per Urination Pain - Male (Adult) guideline. * Immediate medical attention is needed. You need to hang up and call 911 (or an ambulance). Comments User: Coolidge Breeze, RN Date/Time Lamount Cohen Time): 09/20/2020 2:07:54 PM States son willl be driving to ER instead of calling EMS. Advised the safest recommendation is for EMS services and to call w/further questions or concerns. Voices understanding. Referrals GO TO FACILITY OTHER - SPECIFY

## 2020-09-21 NOTE — Evaluation (Signed)
Physical Therapy Evaluation Patient Details Name: [name] MRN: [id number] DOB: 27-Mar-1926 Today's Date: 09/21/2020   History of Present Illness  Pt is a 87 y/o male admitted secondary to urinary incontinence and weakness. Pt with sepsis secondary to UTI. Also being tested for C diff secondary to diarrhea. PMH includes HTN and CVA.  Clinical Impression  Pt admitted secondary to problem above with deficits below. Pt requiring min guard to min A for mobility within ED room. Mild instability noted with 1 LOB. Pt reports he is normally very independent and wife present to confirm. Anticipate pt will progress well and will not require follow up PT. Will continue to follow acutely.     Follow Up Recommendations No PT follow up    Equipment Recommendations  None recommended by PT    Recommendations for Other Services       Precautions / Restrictions Precautions Precautions: Fall Restrictions Weight Bearing Restrictions: No      Mobility  Bed Mobility Overal bed mobility: Needs Assistance Bed Mobility: Sidelying to Sit;Sit to Sidelying   Sidelying to sit: Min assist     Sit to sidelying: Supervision General bed mobility comments: Min A For assist with trunk. Was able to roll independently for clean up following BM.    Transfers Overall transfer level: Needs assistance Equipment used: None Transfers: Sit to/from Stand Sit to Stand: Min guard         General transfer comment: Min guard for safety.  Ambulation/Gait Ambulation/Gait assistance: Min guard;Min assist Gait Distance (Feet): 20 Feet Assistive device: None Gait Pattern/deviations: Step-through pattern;Decreased stride length Gait velocity: Decreased   General Gait Details: Mild unsteadiness noted with LOB X1 requiring min A. Otherwise requiring min guard A.  Stairs            Wheelchair Mobility    Modified Rankin (Stroke Patients Only)       Balance Overall balance assessment: Needs  assistance Sitting-balance support: No upper extremity supported Sitting balance-Leahy Scale: Good     Standing balance support: No upper extremity supported;During functional activity Standing balance-Leahy Scale: Fair                               Pertinent Vitals/Pain Pain Assessment: No/denies pain    Home Living Family/patient expects to be discharged to:: Private residence Living Arrangements: Spouse/significant other Available Help at Discharge: Family;Available 24 hours/day Type of Home: House Home Access: Stairs to enter Entrance Stairs-Rails: None Entrance Stairs-Number of Steps: 1 (threshold) Home Layout: One level Home Equipment: None      Prior Function Level of Independence: Independent         Comments: Pt reports very independent. Wife reports pt was on the roof of his building painting it 2 weeks ago.     Hand Dominance        Extremity/Trunk Assessment   Upper Extremity Assessment Upper Extremity Assessment: Overall WFL for tasks assessed    Lower Extremity Assessment Lower Extremity Assessment: Generalized weakness    Cervical / Trunk Assessment Cervical / Trunk Assessment: Kyphotic  Communication   Communication: HOH  Cognition Arousal/Alertness: Awake/alert Behavior During Therapy: WFL for tasks assessed/performed Overall Cognitive Status: Within Functional Limits for tasks assessed                                        General  Comments General comments (skin integrity, edema, etc.): Pt's wife present throughout session    Exercises     Assessment/Plan    PT Assessment Patient needs continued PT services  PT Problem List Decreased strength;Decreased balance;Decreased mobility       PT Treatment Interventions Gait training;Stair training;Therapeutic exercise;Therapeutic activities;Functional mobility training;Balance training;Patient/family education    PT Goals (Current goals can be found in the  Care Plan section)  Acute Rehab PT Goals Patient Stated Goal: to go home PT Goal Formulation: With patient Time For Goal Achievement: 10/05/20 Potential to Achieve Goals: Good    Frequency Min 3X/week   Barriers to discharge        Co-evaluation               AM-PAC PT "6 Clicks" Mobility  Outcome Measure Help needed turning from your back to your side while in a flat bed without using bedrails?: None Help needed moving from lying on your back to sitting on the side of a flat bed without using bedrails?: A Little Help needed moving to and from a bed to a chair (including a wheelchair)?: A Little Help needed standing up from a chair using your arms (e.g., wheelchair or bedside chair)?: A Little Help needed to walk in hospital room?: A Little Help needed climbing 3-5 steps with a railing? : A Little 6 Click Score: 19    End of Session   Activity Tolerance: Patient tolerated treatment well Patient left: in bed;with call bell/phone within reach (on stretcher in ED) Nurse Communication: Mobility status PT Visit Diagnosis: Unsteadiness on feet (R26.81);Muscle weakness (generalized) (M62.81)    Time: 8563-1497 PT Time Calculation (min) (ACUTE ONLY): 19 min   Charges:   PT Evaluation $PT Eval Low Complexity: 1 Low          Cindee Salt, DPT  Acute Rehabilitation Services  Pager: [phone] Office: [phone]   Lehman Prom 09/21/2020, 11:48 AM

## 2020-09-21 NOTE — ED Notes (Signed)
Patient incontinent of urine x2. Brief changed and incontinence care provided. Will continue to monitor.

## 2020-09-21 NOTE — Telephone Encounter (Signed)
Noted, will await inpatient report. Thanks.

## 2020-09-21 NOTE — ED Notes (Signed)
Breakfast ordered 

## 2020-09-21 NOTE — Progress Notes (Signed)
[facility] Progress Note  Patient: [name]    IWP:[id number]  DOA: 09/20/2020     Date of Service: the patient was seen and examined on 09/21/2020  Brief hospital course: Past medical history of HTN, HLD, GERD, CVA, anemia, BPH.  Presents with complaints of generalized weakness. Found to be secondary to UTI Currently plan is monitor for improvement in diarrhea.  Assessment and Plan: 1.  UTI. Sepsis ruled out. Patient meets SIRS criteria on admission although currently rapid resolution. We will continue with IV antibiotics. Blood cultures so far negative. Patient received IV fluids. Monitor.  2.  Diarrhea. Likely antibiotic induced. Currently resolved with Imodium. Monitor.  C. difficile negative.  3. Hyponatremia. Hypokalemia. Improving. Monitor.  4.  Essential hypertension Blood pressure stable. Continue Cardizem.  5.  BPH cause of recurrent UTI. Outpatient follow-up with urology.   Diet: Regular diet DVT Prophylaxis:   SCDs Start: 09/20/20 2126    Advance goals of care discussion: DNR  Family Communication: family was present at bedside, at the time of interview.  The pt provided permission to discuss medical plan with the family. Opportunity was given to ask question and all questions were answered satisfactorily.   Disposition:  Status is: Inpatient  Remains inpatient appropriate because: Ongoing diarrhea causing dehydration risk   Dispo: The patient is from: Home              Anticipated d/c is to: Home              Anticipated d/c date is: 1 day              Patient currently is not medically stable to d/c.   Difficult to place patient No        Subjective: No nausea no vomiting.  No fever no chills.  No chest pain.  Abdominal pain.  Continues to have diarrhea.  More than 5 bowel movement so far this morning.  No blood in the stool.  Physical Exam:  General: Appear in mild distress, no Rash; Oral Mucosa Clear, moist. no Abnormal  Neck Mass Or lumps, Conjunctiva normal  Cardiovascular: S1 and S2 Present, no Murmur, Respiratory: good respiratory effort, Bilateral Air entry present and CTA, no Crackles, no wheezes Abdomen: Bowel Sound present, Soft and no tenderness Extremities: no Pedal edema Neurology: alert and oriented to time, place, and person affect appropriate. no new focal deficit Gait not checked due to patient safety concerns    Vitals:   09/21/20 1700 09/21/20 1800 09/21/20 1838 09/21/20 2002  BP: 132/60 (!) 121/53  (!) 104/46  Pulse: 73 (!) 53 74 79  Resp: 17 18 16 18   Temp:   97.8 F (36.6 C) 97.7 F (36.5 C)  TempSrc:   Oral Oral  SpO2: 96% 94% 99% 93%  Weight:    70.3 kg    Intake/Output Summary (Last 24 hours) at 09/21/2020 2138 Last data filed at 09/20/2020 2155 Gross per 24 hour  Intake 1000 ml  Output -  Net 1000 ml   Filed Weights   09/21/20 2002  Weight: 70.3 kg    Data Reviewed: I have personally reviewed and interpreted daily labs, tele strips, imaging. I reviewed all nursing notes, pharmacy notes, vitals, pertinent old records I have discussed plan of care as described above with RN and patient/family.  CBC: Recent Labs  Lab 09/20/20 1535 09/21/20 0805  WBC 27.9* 21.0*  NEUTROABS  --  18.6*  HGB 14.6 13.9  HCT 45.6 41.0  MCV 91.0 89.9  PLT 359 306   Basic Metabolic Panel: Recent Labs  Lab 09/20/20 1535 09/21/20 0805  NA 132* 136  K 3.3* 3.7  CL 100 105  CO2 20* 18*  GLUCOSE 112* 96  BUN 13 15  CREATININE 1.06 0.97  CALCIUM 8.8* 8.5*  MG  --  2.0    Studies: DG Chest 1 View  Result Date: 09/20/2020 CLINICAL DATA:  Generalized weakness EXAM: CHEST  1 VIEW COMPARISON:  05/25/2018 FINDINGS: There is a loop recorder in place. The heart size is stable but enlarged. Aortic calcifications are noted. There is no focal infiltrate. There is relatively stable scarring at the lung bases. There may be mild vascular congestion without overt pulmonary edema. There is  no significant pleural effusion. No acute osseous abnormality. IMPRESSION: Cardiomegaly and possible mild vascular congestion without overt pulmonary edema. Electronically Signed   By: Katherine Mantle M.D.   On: 09/20/2020 22:27    Scheduled Meds: . clopidogrel  75 mg Oral Daily  . diltiazem  180 mg Oral Daily  . ferrous sulfate  325 mg Oral Q breakfast  . gemfibrozil  600 mg Oral BID AC  . pantoprazole  40 mg Oral Daily  . rOPINIRole  0.5 mg Oral QHS  . saccharomyces boulardii  250 mg Oral BID  . sucralfate  1 g Oral QAC supper  . tamsulosin  0.4 mg Oral Daily   Continuous Infusions: . cefTRIAXone (ROCEPHIN)  IV    . lactated ringers 50 mL/hr at 09/21/20 1322   PRN Meds: acetaminophen **OR** acetaminophen, loperamide  Time spent: 35 minutes  Author: Lynden Oxford, MD [facility] 09/21/2020 9:38 PM  To reach On-call, see care teams to locate the attending and reach out via [email or web address]. Between 7PM-7AM, please contact night-coverage If you still have difficulty reaching the attending provider, please page the [facility] (Director on Call) for [facility] on amion for assistance.

## 2020-09-22 ENCOUNTER — Telehealth: Payer: Self-pay

## 2020-09-22 LAB — BASIC METABOLIC PANEL
Anion gap: 9 (ref 5–15)
BUN: 19 mg/dL (ref 8–23)
CO2: 19 mmol/L — ABNORMAL LOW (ref 22–32)
Calcium: 8.5 mg/dL — ABNORMAL LOW (ref 8.9–10.3)
Chloride: 105 mmol/L (ref 98–111)
Creatinine, Ser: 0.99 mg/dL (ref 0.61–1.24)
GFR, Estimated: >60 mL/min (ref >60)
Glucose, Bld: 115 mg/dL — ABNORMAL HIGH (ref 70–99)
Potassium: 3.4 mmol/L — ABNORMAL LOW (ref 3.5–5.1)
Sodium: 133 mmol/L — ABNORMAL LOW (ref 135–145)

## 2020-09-22 LAB — CBC
HCT: 36.9 % — ABNORMAL LOW (ref 39.0–52.0)
Hemoglobin: 12.2 g/dL — ABNORMAL LOW (ref 13.0–17.0)
MCH: 29.5 pg (ref 26.0–34.0)
MCHC: 33.1 g/dL (ref 30.0–36.0)
MCV: 89.3 fL (ref 80.0–100.0)
Platelets: 291 10*3/uL (ref 150–400)
RBC: 4.13 MIL/uL — ABNORMAL LOW (ref 4.22–5.81)
RDW: 12.3 % (ref 11.5–15.5)
WBC: 14.9 10*3/uL — ABNORMAL HIGH (ref 4.0–10.5)
nRBC: 0 % (ref 0.0–0.2)

## 2020-09-22 LAB — MAGNESIUM: Magnesium: 1.9 mg/dL (ref 1.7–2.4)

## 2020-09-22 MED ORDER — SACCHAROMYCES BOULARDII 250 MG PO CAPS: 250.0000 mg | ORAL_CAPSULE | Freq: Two times a day (BID) | ORAL | 0 refills | Status: AC

## 2020-09-22 MED ORDER — POTASSIUM CHLORIDE CRYS ER 20 MEQ PO TBCR
40.0000 meq | EXTENDED_RELEASE_TABLET | ORAL | Status: AC
  Administered 2020-09-22: 40 meq via ORAL
  Filled 2020-09-22: qty 2

## 2020-09-22 MED ORDER — LACTATED RINGERS IV BOLUS
1000.0000 mL | Freq: Once | INTRAVENOUS | Status: DC
  Administered 2020-09-22: 1000 mL via INTRAVENOUS

## 2020-09-22 MED ORDER — LOPERAMIDE HCL 2 MG PO CAPS: 2.0000 mg | ORAL_CAPSULE | ORAL | 0 refills | Status: DC | PRN

## 2020-09-22 MED ORDER — LACTATED RINGERS IV BOLUS: 500.0000 mL | Freq: Once | INTRAVENOUS | Status: DC

## 2020-09-22 MED ORDER — CEFPODOXIME PROXETIL 200 MG PO TABS: 200.0000 mg | ORAL_TABLET | Freq: Two times a day (BID) | ORAL | 0 refills | Status: AC

## 2020-09-22 NOTE — Telephone Encounter (Signed)
Transition Care Management Follow-up Telephone Call  Date of discharge and from where: 09/22/2020, [name]  How have you been since you were released from the hospital? Patient is feeling better now since getting home.   Any questions or concerns? No  Items Reviewed:  Did the pt receive and understand the discharge instructions provided? Yes   Medications obtained and verified? Yes   Other? No   Any new allergies since your discharge? No   Dietary orders reviewed? Yes  Do you have support at home? Yes   Home Care and Equipment/Supplies: Were home health services ordered? not applicable If so, what is the name of the agency? N/A  Has the agency set up a time to come to the patient's home? not applicable Were any new equipment or medical supplies ordered?  No What is the name of the medical supply agency? N/A Were you able to get the supplies/equipment? not applicable Do you have any questions related to the use of the equipment or supplies? No  Functional Questionnaire: (I = Independent and D = Dependent) ADLs: I  Bathing/Dressing- I  Meal Prep- I  Eating- I  Maintaining continence- I  Transferring/Ambulation- I  Managing Meds- I  Follow up appointments reviewed:   PCP Hospital f/u appt confirmed? Yes  Scheduled to see Dr. Para March on 09/28/2020 @ 2pm.  Specialist [facility] f/u appt confirmed? N/A   Are transportation arrangements needed? Yes   If their condition worsens, is the pt aware to call PCP or go to the Emergency Dept.? Yes  Was the patient provided with contact information for the PCP's office or ED? Yes  Was to pt encouraged to call back with questions or concerns? Yes

## 2020-09-22 NOTE — TOC Progression Note (Signed)
Transition of Care [facility]) - Progression Note    Patient Details  Name: [name] MRN: [id number] Date of Birth: 04/29/1926  Transition of Care [facility]) CM/SW Contact  Beckie Busing, RN Phone Number: [phone]  09/22/2020, 9:35 AM  Clinical Narrative:    DME rolling walker ordered and to be delivered to the room per [employer]. No other needs noted at this time.        Expected Discharge Plan and Services           Expected Discharge Date: 09/22/20                                     Social Determinants of Health (SDOH) Interventions    Readmission Risk Interventions No flowsheet data found.

## 2020-09-23 LAB — URINE CULTURE: Culture: 60000 — AB

## 2020-09-23 NOTE — Telephone Encounter (Signed)
Noted. Thanks.

## 2020-09-25 ENCOUNTER — Other Ambulatory Visit: Payer: Self-pay

## 2020-09-25 LAB — CULTURE, BLOOD (ROUTINE X 2)
Culture: NO GROWTH
Culture: NO GROWTH
Special Requests: ADEQUATE
Special Requests: ADEQUATE

## 2020-09-25 NOTE — Discharge Summary (Signed)
[facility] Discharge Summary   Patient: [name] [id number]  PCP: Joaquim Nam, MD  Date of admission: 09/20/2020   Date of discharge: 09/22/2020      Discharge Diagnoses:  Principal Problem:   Complicated UTI (urinary tract infection) Active Problems:   Essential hypertension   Hypokalemia   HLD (hyperlipidemia)   Sepsis (HCC)   Leukocytosis   Generalized weakness   Acute hyponatremia   Admitted From: Home Disposition:  Home no PT for therapy  Recommendations for Outpatient Follow-up:  1. PCP: Follow-up with PCP in 1 to 2 weeks 2. Follow up LABS/TEST: None   Follow-up Information    Joaquim Nam, MD. Schedule an appointment as soon as possible for a visit in 1 week(s).   Specialty: Family Medicine Contact information: [address] [place] [address] [phone]              Discharge Instructions    Diet - low sodium heart healthy   Complete by: As directed    Increase activity slowly   Complete by: As directed    No wound care   Complete by: As directed       Diet recommendation: Cardiac diet  Activity: The patient is advised to gradually reintroduce usual activities, as tolerated  Discharge Condition: stable  Code Status: DNR   History of present illness: As per the H and P dictated on admission, "[name] is a 87 y.o. male with medical history significant for hypertension, hyperlipidemia, GERD, acute ischemic CVA, chronic iron deficiency anemia, BPH, who is admitted to [facility] on 09/20/2020 with sepsis due to complicated urinary tract infection after presenting from home to [facility] Emergency Department complaining of generalized weakness.   The following history is provided by the patient as well as my discussions with the patient's wife, who is present at bedside, as well as my discussions with the emergency department physician, in addition to the chart review.  The patient has been experiencing 2 to 3  days of generalized weakness in the absence of any acute focal weakness, numbness, paresthesias, vertigo, dysphagia, slurred speech, facial droop, change in vision.  As a consequence of this generalized weakness, the patient has experienced associated difficulty in ambulating at home, relative to his baseline mobility at which he requires no assistance with ambulation.  He lives at home with his wife.  Denies any associated fall or other recent trauma.    Over the last 2 days, the patient is also been experiencing dysuria, which is new for him, in the absence of any associated gross hematuria or change in urinary urgency/frequency.  Over that timeframe, he is also experienced diminished oral intake in the setting of diminished appetite over that time.  Not associate with any nausea, vomiting, abdominal pain, diarrhea, melena, or hematochezia.  Denies any associated subjective fever, chills, rigors, or generalized myalgias.  Also denies any recent headache, neck stiffness. Denies any recent chest pain, diaphoresis, palpitations, dizziness, presyncope, or syncope.  The patient and his wife report that the patient had recently been diagnosed with acute sinusitis, in the setting of new onset rhinitis and rhinorrhea, which started about 2 weeks ago, in the absence of any associated significant cough or shortness of breath.  In this context, they report that the patient was started on a 10-day course of amoxicillin starting on 09/08/2020.  Over the course of this antibiotic duration, the patient's rhinitis/rhinorrhea has resolved, and he continues to report no cough  or shortness of breath.  No recent travel or known COVID-19 exposures.  He has received 2 doses of the Pfizer COVID-19 vaccine, as well as [employer] booster in November 2021.  "  Hospital Course:  Summary of his active problems in the hospital is as following.   1.  UTI. Sepsis ruled out. Patient meets SIRS criteria on admission although  currently rapid resolution. Treated with IV antibiotics. Blood cultures so far negative. Patient received IV fluids. Change to oral antibiotics now for 3 more days. Monitor.  2.  Diarrhea. Likely antibiotic induced. Currently resolved with Imodium. Monitor.  C. difficile negative.  3. Hyponatremia. Hypokalemia. Improving. Monitor.  4.  Essential hypertension Blood pressure stable. Continue Cardizem.  5.  BPH likely cause of recurrent UTI. Outpatient follow-up with urology.  Body mass index is 25.4 kg/m.   Patient was seen by physical therapy, who recommended No therapy needed on discharge, walker was arranged. On the day of the discharge the patient's vitals were stable, and no other acute medical condition were reported by patient. The patient was felt safe to be discharge at Home with no therapy needed on discharge.  Consultants: none Procedures: none  DISCHARGE MEDICATION: Allergies as of 09/22/2020      Reactions   Angiotensin Receptor Blockers Swelling   Lip and face swelling prev noted   Atorvastatin Other (See Comments)   REACTION: muscles tense up    Codeine Nausea And Vomiting   Lisinopril Swelling, Other (See Comments)   Lips and face swelling   Statins Other (See Comments)   Muscle tense up   Gabapentin Other (See Comments)   Dizzy sensation      Medication List    STOP taking these medications   amoxicillin 500 MG capsule Commonly known as: AMOXIL   hydrochlorothiazide 12.5 MG tablet Commonly known as: HYDRODIURIL     TAKE these medications   acetaminophen 500 MG tablet Commonly known as: TYLENOL Take 1,000 mg by mouth every 6 (six) hours as needed for headache (pain).   AFLIBERCEPT IO Inject into the eye See admin instructions. Right eye injection every 4 weeks   AVASTIN IV Place into the left eye See admin instructions. Left eye injection every 8 weeks   cefpodoxime 200 MG tablet Commonly known as: VANTIN Take 1 tablet (200 mg  total) by mouth 2 (two) times daily for 3 days.   cetirizine 10 MG tablet Commonly known as: ZYRTEC Take 10 mg by mouth daily as needed for allergies.   clopidogrel 75 MG tablet Commonly known as: PLAVIX TAKE 1 TABLET BY MOUTH EVERY DAY   diltiazem 180 MG 24 hr capsule Commonly known as: CARDIZEM CD Take 1 capsule (180 mg total) by mouth daily.   famotidine 20 MG tablet Commonly known as: PEPCID TAKE 1 TABLET (20 MG TOTAL) BY MOUTH 2 (TWO) TIMES DAILY AS NEEDED FOR HEARTBURN OR INDIGESTION.   ferrous sulfate 325 (65 FE) MG tablet Take 1 tablet (325 mg total) by mouth daily with breakfast.   fluticasone 50 MCG/ACT nasal spray Commonly known as: FLONASE Place 2 sprays into both nostrils daily as needed for allergies.   gemfibrozil 600 MG tablet Commonly known as: LOPID TAKE 1 TABLET BY MOUTH TWICE A DAY What changed: when to take this   guaiFENesin 100 MG/5ML Soln Commonly known as: ROBITUSSIN Take 5 mLs (100 mg total) by mouth every 6 (six) hours as needed for cough.   loperamide 2 MG capsule Commonly known as: IMODIUM Take 1  capsule (2 mg total) by mouth as needed for diarrhea or loose stools.   nitroGLYCERIN 0.4 MG SL tablet Commonly known as: NITROSTAT Place 1 tablet (0.4 mg total) under the tongue every 5 (five) minutes as needed for chest pain (max 3 doses in 15 minutes).   OCUVITE ADULT 50+ PO Take 1 capsule by mouth daily with lunch.   pantoprazole 40 MG tablet Commonly known as: PROTONIX TAKE 1 TABLET BY MOUTH EVERY DAY IN THE MORNING What changed: See the new instructions.   Praluent 150 MG/ML Soaj Generic drug: Alirocumab INJECT 150 MG INTO THE SKIN EVERY 14 (FOURTEEN) DAYS. What changed:   how much to take  how to take this  when to take this  additional instructions   rOPINIRole 0.5 MG tablet Commonly known as: REQUIP Take 1 tablet (0.5 mg total) by mouth at bedtime.   saccharomyces boulardii 250 MG capsule Commonly known as:  FLORASTOR Take 1 capsule (250 mg total) by mouth 2 (two) times daily for 14 days.   sucralfate 1 g tablet Commonly known as: CARAFATE Take 1 g by mouth daily.   Systane 0.4-0.3 % Soln Generic drug: Polyethyl Glycol-Propyl Glycol Place 1 drop into both eyes 3 (three) times daily as needed (dry eyes).   tamsulosin 0.4 MG Caps capsule Commonly known as: FLOMAX TAKE 1 CAPSULE BY MOUTH EVERY DAY   traMADol 50 MG tablet Commonly known as: ULTRAM TAKE ONE TABLET BY MOUTH EVERY EIGHT HOURS AS NEEDED FOR MODERATE TO SEVERE PAIN What changed: See the new instructions.   vitamin B-12 1000 MCG tablet Commonly known as: CYANOCOBALAMIN Take 2 tablets (2,000 mcg total) by mouth daily. What changed: when to take this       Discharge Exam: Filed Weights   09/21/20 2002  Weight: 70.3 kg   Vitals:   09/22/20 0315 09/22/20 0817  BP: (!) 116/52 128/82  Pulse: 66 77  Resp: 18 18  Temp: 97.6 F (36.4 C) 97.9 F (36.6 C)  SpO2: 93%    General: Appear in mild distress, no Rash; Oral Mucosa Clear, moist. no Abnormal Neck Mass Or lumps, Conjunctiva normal  Cardiovascular: S1 and S2 Present, no Murmur, Respiratory: good respiratory effort, Bilateral Air entry present and CTA, no Crackles, no wheezes Abdomen: Bowel Sound present, Soft and no tenderness Extremities: no Pedal edema Neurology: alert and oriented to time, place, and person affect appropriate. no new focal deficit Gait not checked due to patient safety concerns    The results of significant diagnostics from this hospitalization (including imaging, microbiology, ancillary and laboratory) are listed below for reference.    Significant Diagnostic Studies: DG Chest 1 View  Result Date: 09/20/2020 CLINICAL DATA:  Generalized weakness EXAM: CHEST  1 VIEW COMPARISON:  05/25/2018 FINDINGS: There is a loop recorder in place. The heart size is stable but enlarged. Aortic calcifications are noted. There is no focal infiltrate. There is  relatively stable scarring at the lung bases. There may be mild vascular congestion without overt pulmonary edema. There is no significant pleural effusion. No acute osseous abnormality. IMPRESSION: Cardiomegaly and possible mild vascular congestion without overt pulmonary edema. Electronically Signed   By: Katherine Mantle M.D.   On: 09/20/2020 22:27    Microbiology: Recent Results (from the past 240 hour(s))  SARS CORONAVIRUS 2 (TAT 6-24 HRS) Nasopharyngeal Nasopharyngeal Swab     Status: None   Collection Time: 09/20/20  7:45 PM   Specimen: Nasopharyngeal Swab  Result Value Ref Range Status   SARS  Coronavirus 2 NEGATIVE NEGATIVE Final    Comment: (NOTE) SARS-CoV-2 target nucleic acids are NOT DETECTED.  The SARS-CoV-2 RNA is generally detectable in upper and lower respiratory specimens during the acute phase of infection. Negative results do not preclude SARS-CoV-2 infection, do not rule out co-infections with other pathogens, and should not be used as the sole basis for treatment or other patient management decisions. Negative results must be combined with clinical observations, patient history, and epidemiological information. The expected result is Negative.  Fact Sheet for Patients: HairSlick.nohttps://www.fda.gov/media/138098/download  Fact Sheet for Healthcare Providers: quierodirigir.comhttps://www.fda.gov/media/138095/download  This test is not yet approved or cleared by the [place] FDA and  has been authorized for detection and/or diagnosis of SARS-CoV-2 by FDA under an Emergency Use Authorization (EUA). This EUA will remain  in effect (meaning this test can be used) for the duration of the COVID-19 declaration under Se ction 564(b)(1) of the Act, 21 U.S.C. section 360bbb-3(b)(1), unless the authorization is terminated or revoked sooner.  Performed at [facility], [address] [place], [place] [address]   Culture, blood (routine x 2)     Status: None   Collection Time:  09/20/20  7:51 PM   Specimen: BLOOD RIGHT FOREARM  Result Value Ref Range Status   Specimen Description BLOOD RIGHT FOREARM  Final   Special Requests   Final    BOTTLES DRAWN AEROBIC AND ANAEROBIC Blood Culture adequate volume   Culture   Final    NO GROWTH 5 DAYS Performed at [facility], [address] [place], [place] [address]    Report Status 09/25/2020 FINAL  Final  Culture, blood (routine x 2)     Status: None   Collection Time: 09/20/20  7:51 PM   Specimen: BLOOD  Result Value Ref Range Status   Specimen Description BLOOD RIGHT ANTECUBITAL  Final   Special Requests   Final    BOTTLES DRAWN AEROBIC AND ANAEROBIC Blood Culture adequate volume   Culture   Final    NO GROWTH 5 DAYS Performed at [facility], [address] [place], [place] [address]    Report Status 09/25/2020 FINAL  Final  Urine culture     Status: Abnormal   Collection Time: 09/20/20  9:54 PM   Specimen: Urine, Clean Catch  Result Value Ref Range Status   Specimen Description URINE, CLEAN CATCH  Final   Special Requests   Final    ADDED 2155 Performed at [facility], [address] [place], [place] [address]    Culture 60,000 COLONIES/mL KLEBSIELLA PNEUMONIAE (A)  Final   Report Status 09/23/2020 FINAL  Final   Organism ID, Bacteria KLEBSIELLA PNEUMONIAE (A)  Final      Susceptibility   Klebsiella pneumoniae - MIC*    AMPICILLIN >=32 RESISTANT Resistant     CEFAZOLIN <=4 SENSITIVE Sensitive     CEFEPIME <=0.12 SENSITIVE Sensitive     CEFTRIAXONE <=0.25 SENSITIVE Sensitive     CIPROFLOXACIN <=0.25 SENSITIVE Sensitive     GENTAMICIN <=1 SENSITIVE Sensitive     IMIPENEM <=0.25 SENSITIVE Sensitive     NITROFURANTOIN 64 INTERMEDIATE Intermediate     TRIMETH/SULFA <=20 SENSITIVE Sensitive     AMPICILLIN/SULBACTAM 8 SENSITIVE Sensitive     PIP/TAZO <=4 SENSITIVE Sensitive     * 60,000 COLONIES/mL KLEBSIELLA PNEUMONIAE  C Difficile Quick Screen w PCR reflex     Status: None    Collection Time: 09/21/20  9:00 AM   Specimen:  STOOL  Result Value Ref Range Status   C Diff antigen NEGATIVE NEGATIVE Final   C Diff toxin NEGATIVE NEGATIVE Final   C Diff interpretation No C. difficile detected.  Final    Comment: Performed at [facility], [address], [place], [place] [address]     Labs: CBC: Recent Labs  Lab 09/20/20 1535 09/21/20 0805 09/22/20 0221  WBC 27.9* 21.0* 14.9*  NEUTROABS  --  18.6*  --   HGB 14.6 13.9 12.2*  HCT 45.6 41.0 36.9*  MCV 91.0 89.9 89.3  PLT 359 306 291   Basic Metabolic Panel: Recent Labs  Lab 09/20/20 1535 09/21/20 0805 09/22/20 0221  NA 132* 136 133*  K 3.3* 3.7 3.4*  CL 100 105 105  CO2 20* 18* 19*  GLUCOSE 112* 96 115*  BUN 13 15 19   CREATININE 1.06 0.97 0.99  CALCIUM 8.8* 8.5* 8.5*  MG  --  2.0 1.9   Liver Function Tests: Recent Labs  Lab 09/20/20 1535  AST 20  ALT 11  ALKPHOS 73  BILITOT 1.1  PROT 7.5  ALBUMIN 3.3*   CBG: No results for input(s): GLUCAP in the last 168 hours.  Time spent: 35 minutes  Signed:  09/22/20  [facility] 09/22/2020  11:44 AM

## 2020-09-28 ENCOUNTER — Other Ambulatory Visit: Payer: Self-pay

## 2020-09-28 ENCOUNTER — Encounter: Payer: Self-pay | Admitting: Family Medicine

## 2020-09-28 ENCOUNTER — Ambulatory Visit (INDEPENDENT_AMBULATORY_CARE_PROVIDER_SITE_OTHER): Payer: PPO | Admitting: Family Medicine

## 2020-09-28 VITALS — BP 142/58 | HR 67 | Temp 97.5°F | Ht 66.0 in | Wt 146.0 lb

## 2020-09-28 DIAGNOSIS — N390 Urinary tract infection, site not specified: Secondary | ICD-10-CM | POA: Diagnosis not present

## 2020-09-28 DIAGNOSIS — I10 Essential (primary) hypertension: Secondary | ICD-10-CM | POA: Diagnosis not present

## 2020-09-28 NOTE — Progress Notes (Signed)
This visit occurred during the SARS-CoV-2 public health emergency.  Safety protocols were in place, including screening questions prior to the visit, additional usage of staff PPE, and extensive cleaning of exam room while observing appropriate contact time as indicated for disinfecting solutions.  ========================== Date of admission: 09/20/2020             Date of discharge: 09/22/2020      Discharge Diagnoses:  Principal Problem:   Complicated UTI (urinary tract infection) Active Problems:   Essential hypertension   Hypokalemia   HLD (hyperlipidemia)   Sepsis (HCC)   Leukocytosis   Generalized weakness   Acute hyponatremia  Diet recommendation: Cardiac diet  Activity: The patient is advised to gradually reintroduce usual activities, as tolerated  Discharge Condition: stable  Code Status: DNR   History of present illness: As per the H and P dictated on admission, "[name] Jonesis a 87 y.o.malewith medical history significant forhypertension, hyperlipidemia, GERD, acute ischemic CVA, chronic iron deficiency anemia, BPH,who is admitted to [facility] on 1/30/2022with sepsis due to complicated urinary tract infectionafter presenting from home to [facility] Emergency Department complaining of generalized weakness.  The following history is provided by the patient as well as my discussions with the patient's wife, who is present at bedside, as well as my discussions with the emergency department physician, in addition to the chart review.  The patient has been experiencing 2 to 3 days of generalized weakness in the absence of any acute focal weakness, numbness, paresthesias, vertigo, dysphagia, slurred speech, facial droop, changeinvision. As a consequence of this generalized weakness, the patient has experienced associated difficulty in ambulating at home, relative to his baseline mobility at which he requires no assistance with ambulation.He lives at home with  his wife.Denies any associated fall or other recent trauma.   Over the last 2 days, the patient is also been experiencing dysuria, which is new for him, in the absence of any associated gross hematuria or change in urinary urgency/frequency. Over that timeframe, he is also experienced diminished oral intake in the setting of diminished appetite over that time. Not associate with any nausea, vomiting, abdominal pain, diarrhea, melena, or hematochezia.  Denies any associated subjective fever, chills, rigors, or generalized myalgias. Also denies any recent headache, neck stiffness.Denies any recent chest pain, diaphoresis, palpitations, dizziness, presyncope, or syncope.  The patient and his wife report that the patient had recently been diagnosed with acute sinusitis, in the settingof new onset rhinitis and rhinorrhea, which started about 2 weeks ago,in the absence of any associated significant cough or shortness of breath. In this context, they report that the patient was started on a 10-day course of amoxicillin starting on 09/08/2020.Over the course of this antibiotic duration, the patient's rhinitis/rhinorrhea has resolved, and he continues to report no cough or shortness of breath. No recent travel or known COVID-19 exposures. He has received 2 doses of the Pfizer COVID-19 vaccine, as well as [employer] booster in November 2021.  "  Hospital Course:  Summary of his active problems in the hospital is as following.   1.UTI. Sepsis ruled out. Patient meets SIRS criteria on admission although currently rapid resolution. Treated with IV antibiotics. Blood cultures so far negative. Patient received IV fluids. Change to oral antibiotics now for 3 more days. Monitor.  2.Diarrhea. Likely antibiotic induced. Currently resolved with Imodium. Monitor. C. difficile negative.  3.Hyponatremia. Hypokalemia. Improving. Monitor.  4.Essential hypertension Blood pressure  stable. Continue Cardizem.  5.BPH likely cause of recurrent UTI.  Outpatient follow-up with urology.  ==========================  Inpatient f/u.  He was prev on abx but then got worse, had urinary sx, then went to ER because he was clinically declining.  Admitted with SIRS from UTI, culture data d/w pt.  Now done with abx.  Now w/o FCNAVD.  Normal urination.  Still fatigued but he clearly feels better compared to admission.  He had antibiotic associated diarrhea but that resolved in the meantime.  Normal BM today.  He is still off HCTZ.    We talked about seeing urology, given the recent UTI.  Meds, vitals, and allergies reviewed.   ROS: Per HPI unless specifically indicated in ROS section   GEN: nad, alert and oriented HEENT: ncat NECK: supple w/o LA CV: rrr.  PULM: ctab, no inc wob ABD: soft, +bs, not ttp EXT: no edema SKIN: Well-perfused.

## 2020-09-28 NOTE — Patient Instructions (Addendum)
If you have any trouble with painful urination or fever, then call us.   Go to the lab on the way out.   If you have mychart we'll likely use that to update you.    Stay off HCTZ/hydrochlorothiazide for now.  We'll call about seeing urology.   Take care.  Glad to see you.

## 2020-09-29 LAB — BASIC METABOLIC PANEL
BUN: 15 mg/dL (ref 6–23)
CO2: 24 mEq/L (ref 19–32)
Calcium: 8.6 mg/dL (ref 8.4–10.5)
Chloride: 107 mEq/L (ref 96–112)
Creatinine, Ser: 1.22 mg/dL (ref 0.40–1.50)
GFR: 51.31 mL/min — ABNORMAL LOW (ref >60.00)
Glucose, Bld: 109 mg/dL — ABNORMAL HIGH (ref 70–99)
Potassium: 4.2 mEq/L (ref 3.5–5.1)
Sodium: 138 mEq/L (ref 135–145)

## 2020-09-29 LAB — CBC WITH DIFFERENTIAL/PLATELET
Basophils Absolute: 0.1 10*3/uL (ref 0.0–0.1)
Basophils Relative: 1 % (ref 0.0–3.0)
Eosinophils Absolute: 0.3 10*3/uL (ref 0.0–0.7)
Eosinophils Relative: 5 % (ref 0.0–5.0)
HCT: 40.2 % (ref 39.0–52.0)
Hemoglobin: 13.3 g/dL (ref 13.0–17.0)
Lymphocytes Relative: 27 % (ref 12.0–46.0)
Lymphs Abs: 1.7 10*3/uL (ref 0.7–4.0)
MCHC: 33.1 g/dL (ref 30.0–36.0)
MCV: 89.8 fl (ref 78.0–100.0)
Monocytes Absolute: 0.6 10*3/uL (ref 0.1–1.0)
Monocytes Relative: 9.1 % (ref 3.0–12.0)
Neutro Abs: 3.8 10*3/uL (ref 1.4–7.7)
Neutrophils Relative %: 57.9 % (ref 43.0–77.0)
Platelets: 388 10*3/uL (ref 150.0–400.0)
RBC: 4.48 Mil/uL (ref 4.22–5.81)
RDW: 13.4 % (ref 11.5–15.5)
WBC: 6.5 10*3/uL (ref 4.0–10.5)

## 2020-09-30 NOTE — Assessment & Plan Note (Signed)
See notes on labs.  Would stay off hydrochlorothiazide at this point.  Discussed.

## 2020-09-30 NOTE — Assessment & Plan Note (Signed)
Requiring inpatient admission, IV antibiotics, transition to oral antibiotics based on culture data and improved in the meantime.  Still fatigued but clearly feels better.  His diarrhea has resolved.  He still off hydrochlorothiazide.  We talked about seeing urology given his situation.  Referral placed.  Reasonable to recheck routine labs today.  See notes on labs.  Okay for outpatient follow-up.  He agrees with plan.

## 2020-10-07 ENCOUNTER — Other Ambulatory Visit: Payer: Self-pay | Admitting: Family Medicine

## 2020-10-07 DIAGNOSIS — G2581 Restless legs syndrome: Secondary | ICD-10-CM

## 2020-10-23 ENCOUNTER — Other Ambulatory Visit: Payer: Self-pay

## 2020-10-23 ENCOUNTER — Telehealth: Payer: Self-pay | Admitting: Family Medicine

## 2020-10-23 MED ORDER — PRALUENT 150 MG/ML ~~LOC~~ SOAJ: SUBCUTANEOUS | 3 refills | Status: DC

## 2020-10-23 NOTE — Telephone Encounter (Signed)
Rx has been refilled.  

## 2020-10-23 NOTE — Telephone Encounter (Signed)
Pt wife called in he is needing a refill on the praluent pen just a 30 day supply

## 2020-10-28 DIAGNOSIS — R350 Frequency of micturition: Secondary | ICD-10-CM | POA: Diagnosis not present

## 2020-10-28 DIAGNOSIS — R351 Nocturia: Secondary | ICD-10-CM | POA: Diagnosis not present

## 2020-10-29 DIAGNOSIS — H353231 Exudative age-related macular degeneration, bilateral, with active choroidal neovascularization: Secondary | ICD-10-CM | POA: Diagnosis not present

## 2020-10-29 NOTE — Progress Notes (Unsigned)
Cardiology Office Note:    Date:  10/30/2020   ID:  [name], DOB 04-Nov-1925, MRN [id number]  PCP:  Joaquim Nam, MD   [facility]  Cardiologist:  Lance Muss, MD   Advanced Practice Provider:  Beatrice Lecher, PA-C Electrophysiologist:  None       Referring MD: Joaquim Nam, MD   Chief Complaint:  Follow-up (CAD)    Patient Profile:    [name] is a 87 y.o. male with:   Hx of cryptogenic CVA in 6/16  Hx of splenic infarct   S/p ILR (at EOL; no longer monitored)  Hypertension   Hyperlipidemia   COPD   von Willebrand's Disease  Coronary artery disease   Coronary Ca2+ on prior CT   Myoview 2017: inf ischemia >> Med Rx  GERD  GI AVMs  Barrett's esophagus   Prior CV studies: Chest CT 04/06/16 IMPRESSION: 1. The nodule questioned on left shoulder films represents a small calcified granuloma within the superior segment of the left lower lobe. There also are small calcified splenic granulomas consistent with prior granulomatous disease. 2. Moderate thoracic aortic atherosclerosis. 3.Calcification of the coronary arteries diffusely. 4. Paraseptal emphysema.  Myoview 03/25/16 Medium size, moderate intensity (SDS 5) reversible inferior perfusion defect which could suggest ischemia. Study was not gated due to frequent ventricular ectopy. This is an intermediate risk study.  TEE 10/28/15 EF 55%, normal wall motion, grade 4 plaque descending thoracic aorta, trivial MR, normal RVSF, no right-to-left atrial shunt  Event monitor 8/16 Sinus rhythm Rare pacs and pvcs No sustained arrhythmias No afib  Echo 6/16 Mild LVH, EF 85%, normal wall motion, mildly reduced RVSF, PASP 37 mmHg   History of Present Illness:    [name] was last seen in 10/2018.  He returns for f/u. He is here with his wife.  He was hospitalized in 08/2020 for a UTI.  He now sees urology. 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  He has not had chest pain, significant shortness of breath, syncope.       Past Medical History:  Diagnosis Date  . Back pain    improved with gabapentin as of 2015  . Barrett esophagus 09/2003  . Coronary artery calcification seen on CT scan   . Diverticulitis   . Diverticulosis   . GERD (gastroesophageal reflux disease)   . GI AVM (gastrointestinal arteriovenous vascular malformation)   . Hearing loss   . Hemorrhoids   . History of Holter monitoring    a. holter 9/17: NSR, PVCs; no sustained arrhythmia; PVC burden 8%  . History of nuclear stress test    a. Myoview 8/17: Intermediate risk, inferior ischemia, not gated  . History of prostatitis   . History of stroke   . Hyperlipidemia   . Hypertension   . Macular degeneration, wet (HCC)    receiving intra-ocular injections ([place])  . PUD (peptic ulcer disease)     Current Medications: Current Meds  Medication Sig  . acetaminophen (TYLENOL) 500 MG tablet Take 1,000 mg by mouth every 6 (six) hours as needed for headache (pain).  . AFLIBERCEPT IO Inject into the eye See admin instructions. Right eye injection every 4 weeks  . Alirocumab (PRALUENT) 150 MG/ML SOAJ INJECT 150 MG INTO THE SKIN EVERY 14 (FOURTEEN) DAYS.  [name] Kitchen Bevacizumab (AVASTIN IV) Place into the left eye See admin instructions. Left eye injection every 8 weeks  . cetirizine (ZYRTEC) 10  MG tablet Take 10 mg by mouth daily as needed for allergies.  [name] Kitchen clopidogrel (PLAVIX) 75 MG tablet TAKE 1 TABLET BY MOUTH EVERY DAY  . diltiazem (CARDIZEM CD) 180 MG 24 hr capsule Take 1 capsule (180 mg total) by mouth daily.  . famotidine (PEPCID) 20 MG tablet TAKE 1 TABLET (20 MG TOTAL) BY MOUTH 2 (TWO) TIMES DAILY AS NEEDED FOR HEARTBURN OR INDIGESTION.  . ferrous sulfate 325 (65 FE) MG tablet Take 1 tablet (325 mg total) by mouth daily with breakfast.  . fluticasone (FLONASE) 50 MCG/ACT nasal spray Place 2 sprays into both nostrils daily as needed for allergies.  [name] Kitchen  gemfibrozil (LOPID) 600 MG tablet TAKE 1 TABLET BY MOUTH TWICE A DAY  . loperamide (IMODIUM) 2 MG capsule Take 1 capsule (2 mg total) by mouth as needed for diarrhea or loose stools.  . Multiple Vitamins-Minerals (OCUVITE ADULT 50+ PO) Take 1 capsule by mouth daily with lunch.  . nitroGLYCERIN (NITROSTAT) 0.4 MG SL tablet Place 1 tablet (0.4 mg total) under the tongue every 5 (five) minutes as needed for chest pain (max 3 doses in 15 minutes).  . pantoprazole (PROTONIX) 40 MG tablet TAKE 1 TABLET BY MOUTH EVERY DAY IN THE MORNING  . Polyethyl Glycol-Propyl Glycol (SYSTANE) 0.4-0.3 % SOLN Place 1 drop into both eyes 3 (three) times daily as needed (dry eyes).  [name] Kitchen rOPINIRole (REQUIP) 0.5 MG tablet TAKE 1 TABLET BY MOUTH AT BEDTIME.  . tamsulosin (FLOMAX) 0.4 MG CAPS capsule TAKE 1 CAPSULE BY MOUTH EVERY DAY  . traMADol (ULTRAM) 50 MG tablet TAKE ONE TABLET BY MOUTH EVERY EIGHT HOURS AS NEEDED FOR MODERATE TO SEVERE PAIN  . vitamin B-12 (CYANOCOBALAMIN) 1000 MCG tablet Take 2 tablets (2,000 mcg total) by mouth daily.     Allergies:   Angiotensin receptor blockers, Atorvastatin, Codeine, Lisinopril, Statins, and Gabapentin   Social History   Tobacco Use  . Smoking status: Former Smoker    Packs/day: 0.00    Years: 36.00    Pack years: 0.00    Types: Cigarettes  . Smokeless tobacco: Never Used  . Tobacco comment: Quit in 1983  Vaping Use  . Vaping Use: Never used  Substance Use Topics  . Alcohol use: No    Alcohol/week: 0.0 standard drinks  . Drug use: No     Family Hx: The patient's family history includes Arthritis in his sister; Bleeding Disorder in his son; Cancer in his brother and mother; Coronary artery disease in his father; Heart attack in his father; Hyperlipidemia in his sister; Hypertension in his mother; Pancreatic cancer in his mother; Prostate cancer in his brother. There is no history of Diabetes.  Review of Systems  Genitourinary: Positive for frequency.      EKGs/Labs/Other Test Reviewed:    EKG:  EKG is   ordered today.  The ekg ordered today demonstrates normal sinus rhythm, HR 85, normal axis, PACs, no ST-TW changes, QTc 426 ms, no change from prior tracing  Recent Labs: 09/20/2020: ALT 11 09/21/2020: TSH 2.225 09/22/2020: Magnesium 1.9 09/28/2020: BUN 15; Creatinine, Ser 1.22; Hemoglobin 13.3; Platelets 388.0; Potassium 4.2; Sodium 138   Recent Lipid Panel Lab Results  Component Value Date/Time   CHOL 138 12/02/2019 08:38 AM   TRIG 84.0 12/02/2019 08:38 AM   HDL 56.50 12/02/2019 08:38 AM   CHOLHDL 2 12/02/2019 08:38 AM   LDLCALC 65 12/02/2019 08:38 AM   LDLDIRECT 167.2 08/12/2013 10:24 AM      Risk Assessment/Calculations:  Physical Exam:    VS:  BP 138/64   Pulse 85   Ht 5\' 6"  (1.676 m)   Wt 150 lb (68 kg)   SpO2 96%   BMI 24.21 kg/m     Wt Readings from Last 3 Encounters:  10/30/20 150 lb (68 kg)  09/28/20 146 lb (66.2 kg)  09/21/20 154 lb 15.7 oz (70.3 kg)     Constitutional:      Appearance: Healthy appearance. Not in distress.  Pulmonary:     Effort: Pulmonary effort is normal.     Breath sounds: No wheezing. No rales.  Cardiovascular:     Normal rate. Regularly irregular rhythm. Normal S1. Normal S2.     Murmurs: There is no murmur.  Edema:    Peripheral edema absent.  Abdominal:     Palpations: Abdomen is soft.  Musculoskeletal:     Cervical back: Neck supple. Skin:    General: Skin is warm and dry.  Neurological:     General: No focal deficit present.     Mental Status: Alert and oriented to person, place and time.     Cranial Nerves: Cranial nerves are intact.       ASSESSMENT & PLAN:    1. Coronary artery disease involving native coronary artery of native heart without angina pectoris Coronary Ca2+ on prior CT.  He had a Myoview in 2017 with inferior ischemia. He has been managed medically. He is doing well w/o angina.  Continue clopidogrel, alirocumab.  F/u 1 year.   2. Essential  hypertension The patient's blood pressure is controlled on his current regimen.  Continue current therapy.    3. Mixed hyperlipidemia Most recent LDL optimal.  Continue Alirocumab.    4. History of CVA (cerebrovascular accident) He had an ILR placed in 2016 and never had AFib documented.  His device is no longer active.      Dispo:  Return in about 1 year (around 10/30/2021) for Routine Follow Up, w/ 12/30/2021, PA-C, in person.   Medication Adjustments/Labs and Tests Ordered: Current medicines are reviewed at length with the patient today.  Concerns regarding medicines are outlined above.  Tests Ordered: Orders Placed This Encounter  Procedures  . EKG 12-Lead   Medication Changes: No orders of the defined types were placed in this encounter.   Signed, Tereso Newcomer, PA-C  10/30/2020 12:06 PM    [facility] [address] [place], [place], [place]  [place] Phone: [phone]; Fax: [phone]

## 2020-10-30 ENCOUNTER — Ambulatory Visit: Payer: PPO | Admitting: Physician Assistant

## 2020-10-30 ENCOUNTER — Encounter: Payer: Self-pay | Admitting: Physician Assistant

## 2020-10-30 ENCOUNTER — Other Ambulatory Visit: Payer: Self-pay

## 2020-10-30 VITALS — BP 138/64 | HR 85 | Ht 66.0 in | Wt 150.0 lb

## 2020-10-30 DIAGNOSIS — I2510 Atherosclerotic heart disease of native coronary artery without angina pectoris: Secondary | ICD-10-CM

## 2020-10-30 DIAGNOSIS — I10 Essential (primary) hypertension: Secondary | ICD-10-CM

## 2020-10-30 DIAGNOSIS — E782 Mixed hyperlipidemia: Secondary | ICD-10-CM

## 2020-10-30 DIAGNOSIS — Z8673 Personal history of transient ischemic attack (TIA), and cerebral infarction without residual deficits: Secondary | ICD-10-CM | POA: Diagnosis not present

## 2020-10-30 NOTE — Patient Instructions (Signed)
Medication Instructions:  No changes.  Continue current medications.   *If you need a refill on your cardiac medications before your next appointment, please call your pharmacy*     Follow-Up: At [facility], you and your health needs are our priority.  As part of our continuing mission to provide you with exceptional heart care, we have created designated Provider Care Teams.  These Care Teams include your primary Cardiologist (physician) and Advanced Practice Providers (APPs -  Physician Assistants and Nurse Practitioners) who all work together to provide you with the care you need, when you need it.  We recommend signing up for the patient portal called "MyChart".  Sign up information is provided on this After Visit Summary.  MyChart is used to connect with patients for Virtual Visits (Telemedicine).  Patients are able to view lab/test results, encounter notes, upcoming appointments, etc.  Non-urgent messages can be sent to your provider as well.   To learn more about what you can do with MyChart, go to [email or web address].    Your next appointment:   12 month(s)  The format for your next appointment:   In Person  Provider:   Tereso Newcomer, PA-C   Other Instructions

## 2020-11-30 DIAGNOSIS — H35423 Microcystoid degeneration of retina, bilateral: Secondary | ICD-10-CM | POA: Diagnosis not present

## 2020-11-30 DIAGNOSIS — H43813 Vitreous degeneration, bilateral: Secondary | ICD-10-CM | POA: Diagnosis not present

## 2020-11-30 DIAGNOSIS — H353231 Exudative age-related macular degeneration, bilateral, with active choroidal neovascularization: Secondary | ICD-10-CM | POA: Diagnosis not present

## 2020-11-30 DIAGNOSIS — H353211 Exudative age-related macular degeneration, right eye, with active choroidal neovascularization: Secondary | ICD-10-CM | POA: Diagnosis not present

## 2020-12-03 ENCOUNTER — Ambulatory Visit (INDEPENDENT_AMBULATORY_CARE_PROVIDER_SITE_OTHER): Payer: PPO | Admitting: Family Medicine

## 2020-12-03 ENCOUNTER — Encounter: Payer: Self-pay | Admitting: Family Medicine

## 2020-12-03 ENCOUNTER — Other Ambulatory Visit: Payer: Self-pay

## 2020-12-03 VITALS — BP 122/80 | HR 81 | Temp 98.0°F | Ht 66.0 in | Wt 151.0 lb

## 2020-12-03 DIAGNOSIS — R1030 Lower abdominal pain, unspecified: Secondary | ICD-10-CM | POA: Diagnosis not present

## 2020-12-03 DIAGNOSIS — M545 Low back pain, unspecified: Secondary | ICD-10-CM

## 2020-12-03 LAB — CBC WITH DIFFERENTIAL/PLATELET
Basophils Absolute: 0 10*3/uL (ref 0.0–0.1)
Basophils Relative: 0.8 % (ref 0.0–3.0)
Eosinophils Absolute: 0.2 10*3/uL (ref 0.0–0.7)
Eosinophils Relative: 3.5 % (ref 0.0–5.0)
HCT: 41.5 % (ref 39.0–52.0)
Hemoglobin: 13.9 g/dL (ref 13.0–17.0)
Lymphocytes Relative: 23.2 % (ref 12.0–46.0)
Lymphs Abs: 1.4 10*3/uL (ref 0.7–4.0)
MCHC: 33.5 g/dL (ref 30.0–36.0)
MCV: 90 fl (ref 78.0–100.0)
Monocytes Absolute: 0.6 10*3/uL (ref 0.1–1.0)
Monocytes Relative: 9.9 % (ref 3.0–12.0)
Neutro Abs: 3.8 10*3/uL (ref 1.4–7.7)
Neutrophils Relative %: 62.6 % (ref 43.0–77.0)
Platelets: 250 10*3/uL (ref 150.0–400.0)
RBC: 4.61 Mil/uL (ref 4.22–5.81)
RDW: 13.9 % (ref 11.5–15.5)
WBC: 6.1 10*3/uL (ref 4.0–10.5)

## 2020-12-03 LAB — POC URINALSYSI DIPSTICK (AUTOMATED)
Bilirubin, UA: NEGATIVE
Blood, UA: NEGATIVE
Glucose, UA: NEGATIVE
Ketones, UA: NEGATIVE
Leukocytes, UA: NEGATIVE
Nitrite, UA: NEGATIVE
Protein, UA: NEGATIVE
Spec Grav, UA: 1.02 (ref 1.010–1.025)
Urobilinogen, UA: 0.2 E.U./dL
pH, UA: 6 (ref 5.0–8.0)

## 2020-12-03 LAB — COMPREHENSIVE METABOLIC PANEL
ALT: 8 U/L (ref 0–53)
AST: 16 U/L (ref 0–37)
Albumin: 3.9 g/dL (ref 3.5–5.2)
Alkaline Phosphatase: 73 U/L (ref 39–117)
BUN: 19 mg/dL (ref 6–23)
CO2: 26 mEq/L (ref 19–32)
Calcium: 9.3 mg/dL (ref 8.4–10.5)
Chloride: 104 mEq/L (ref 96–112)
Creatinine, Ser: 1.11 mg/dL (ref 0.40–1.50)
GFR: 57.4 mL/min — ABNORMAL LOW (ref >60.00)
Glucose, Bld: 133 mg/dL — ABNORMAL HIGH (ref 70–99)
Potassium: 4.1 mEq/L (ref 3.5–5.1)
Sodium: 137 mEq/L (ref 135–145)
Total Bilirubin: 0.4 mg/dL (ref 0.2–1.2)
Total Protein: 7.1 g/dL (ref 6.0–8.3)

## 2020-12-03 LAB — LIPASE: Lipase: 22 U/L (ref 11.0–59.0)

## 2020-12-03 MED ORDER — SULFAMETHOXAZOLE-TRIMETHOPRIM 800-160 MG PO TABS: 1.0000 | ORAL_TABLET | Freq: Two times a day (BID) | ORAL | 0 refills | Status: DC

## 2020-12-03 NOTE — Progress Notes (Signed)
This visit occurred during the SARS-CoV-2 public health emergency.  Safety protocols were in place, including screening questions prior to the visit, additional usage of staff PPE, and extensive cleaning of exam room while observing appropriate contact time as indicated for disinfecting solutions.  Abd pain.  Sx come and go but not right now.  Most recently had discomfort earlier this AM, as he was getting up.  Started about 5-6 days ago, may be getting less frequent.  Was able to work in the garden yesterday.  Lower abd discomfort, can feel it in the lower back.  No fevers.  No vomiting.  No diarrhea.  No blood in urine or stool.  He noted a change in urine odor but not this AM.  No burning with urination.  H/o UTI noted, d/w pt.  This doesn't feel similar.  Appetite is still good. Normal BMs, no black stools.    Meds, vitals, and allergies reviewed.   ROS: Per HPI unless specifically indicated in ROS section   GEN: nad, alert and oriented HEENT: ncat NECK: supple w/o LA CV: rrr. PULM: ctab, no inc wob ABD: soft, +bs, not ttp.  EXT: no edema SKIN: no acute rash

## 2020-12-03 NOTE — Patient Instructions (Signed)
Go to the lab on the way out.   If you have mychart we'll likely use that to update you.     Update [place] if worse in the meantime.  Call [phone].   If you have any pain with urination or blood in urine, start taking septra.    Take care.  Glad to see you.

## 2020-12-04 LAB — URINE CULTURE
MICRO NUMBER:: 11771409
Result:: NO GROWTH
SPECIMEN QUALITY:: ADEQUATE

## 2020-12-06 NOTE — Assessment & Plan Note (Signed)
No symptoms currently but given his recent symptoms and his history of UTI, reasonable to recheck routine labs.  See notes on labs.  If he has any dysuria or fevers at home to start Septra in the meantime, while his urine culture is pending.  Routine cautions given to patient.  He agrees with plan.  Okay for outpatient follow-up.

## 2020-12-15 DIAGNOSIS — H353231 Exudative age-related macular degeneration, bilateral, with active choroidal neovascularization: Secondary | ICD-10-CM | POA: Diagnosis not present

## 2020-12-15 DIAGNOSIS — H52203 Unspecified astigmatism, bilateral: Secondary | ICD-10-CM | POA: Diagnosis not present

## 2020-12-15 DIAGNOSIS — Z961 Presence of intraocular lens: Secondary | ICD-10-CM | POA: Diagnosis not present

## 2020-12-17 ENCOUNTER — Other Ambulatory Visit: Payer: Self-pay | Admitting: Family Medicine

## 2021-01-04 ENCOUNTER — Ambulatory Visit (INDEPENDENT_AMBULATORY_CARE_PROVIDER_SITE_OTHER): Payer: PPO | Admitting: Family Medicine

## 2021-01-04 ENCOUNTER — Encounter: Payer: Self-pay | Admitting: Family Medicine

## 2021-01-04 ENCOUNTER — Other Ambulatory Visit: Payer: Self-pay

## 2021-01-04 VITALS — BP 128/74 | HR 74 | Temp 97.5°F | Ht 66.0 in | Wt 151.0 lb

## 2021-01-04 DIAGNOSIS — R1030 Lower abdominal pain, unspecified: Secondary | ICD-10-CM

## 2021-01-04 MED ORDER — TRAMADOL HCL 50 MG PO TABS: ORAL_TABLET | ORAL | 3 refills | Status: DC

## 2021-01-04 NOTE — Progress Notes (Signed)
This visit occurred during the SARS-CoV-2 public health emergency.  Safety protocols were in place, including screening questions prior to the visit, additional usage of staff PPE, and extensive cleaning of exam room while observing appropriate contact time as indicated for disinfecting solutions.  He needed a refill on tramadol.  Prev used prn for back pain w/o ADE.   Started with lower back pain.  Lower midline pain.  That was about 2 weeks ago.  He had that for several days, then had lower abd pain, then lower back pain again.  Mild lower abd discomfort now, this is better but not back to normal for patient.    He was in the kitchen a few days ago and felt weak w/o room spinning.  No syncope.  He remembers the event.  No LOC.  He has been eating well but feels diffusely weak and fatigued.  Not lightheaded on standing.  No exertional CP, not SOB.  He has some episodic discomfort at rest, when supine, improved with burping and that is at baseline.   No dysuria, ie no burning but he noted a change in urine odor.  He cut out soda in the meantime, off caffeine now.  Stopped drinking Dr. Reino Kent (small can prev, 1 or occ 2 a day).  Drinking decaf coffee and decaf tea.  Drinking more water than anything per patient report.   No fevers.  No vomiting.  No rash.  Normal stools.  No blood in urine or BMs.  No black stools.    He had similar sx about 1 month ago with abdominal discomfort, except this episode also had back pain and that was different.  No new meds.      Meds, vitals, and allergies reviewed.   ROS: Per HPI unless specifically indicated in ROS section   GEN: nad, alert and oriented HEENT: ncat NECK: supple w/o LA CV: rrr.   PULM: ctab, no inc wob ABD: soft, +bs EXT: no edema SKIN: no acute rash He has abd discomfort, lower abd area but not worse with palpation.  No rebound.

## 2021-01-04 NOTE — Patient Instructions (Signed)
Go to the lab on the way out.   If you have mychart we'll likely use that to update you.    We'll see about options when I get your labs back.  Take care.  Glad to see you.

## 2021-01-05 LAB — CBC WITH DIFFERENTIAL/PLATELET
Basophils Absolute: 0 10*3/uL (ref 0.0–0.1)
Basophils Relative: 0.7 % (ref 0.0–3.0)
Eosinophils Absolute: 0.2 10*3/uL (ref 0.0–0.7)
Eosinophils Relative: 3.1 % (ref 0.0–5.0)
HCT: 40.2 % (ref 39.0–52.0)
Hemoglobin: 13.5 g/dL (ref 13.0–17.0)
Lymphocytes Relative: 22.7 % (ref 12.0–46.0)
Lymphs Abs: 1.4 10*3/uL (ref 0.7–4.0)
MCHC: 33.6 g/dL (ref 30.0–36.0)
MCV: 89.6 fl (ref 78.0–100.0)
Monocytes Absolute: 0.5 10*3/uL (ref 0.1–1.0)
Monocytes Relative: 8.7 % (ref 3.0–12.0)
Neutro Abs: 4 10*3/uL (ref 1.4–7.7)
Neutrophils Relative %: 64.8 % (ref 43.0–77.0)
Platelets: 221 10*3/uL (ref 150.0–400.0)
RBC: 4.48 Mil/uL (ref 4.22–5.81)
RDW: 13.8 % (ref 11.5–15.5)
WBC: 6.1 10*3/uL (ref 4.0–10.5)

## 2021-01-05 LAB — COMPREHENSIVE METABOLIC PANEL
ALT: 8 U/L (ref 0–53)
AST: 15 U/L (ref 0–37)
Albumin: 4.2 g/dL (ref 3.5–5.2)
Alkaline Phosphatase: 72 U/L (ref 39–117)
BUN: 20 mg/dL (ref 6–23)
CO2: 26 mEq/L (ref 19–32)
Calcium: 9 mg/dL (ref 8.4–10.5)
Chloride: 106 mEq/L (ref 96–112)
Creatinine, Ser: 1.15 mg/dL (ref 0.40–1.50)
GFR: 54.97 mL/min — ABNORMAL LOW (ref >60.00)
Glucose, Bld: 112 mg/dL — ABNORMAL HIGH (ref 70–99)
Potassium: 4 mEq/L (ref 3.5–5.1)
Sodium: 140 mEq/L (ref 135–145)
Total Bilirubin: 0.3 mg/dL (ref 0.2–1.2)
Total Protein: 6.8 g/dL (ref 6.0–8.3)

## 2021-01-05 LAB — URINE CULTURE
MICRO NUMBER:: 11894478
Result:: NO GROWTH
SPECIMEN QUALITY:: ADEQUATE

## 2021-01-05 LAB — URINALYSIS, ROUTINE W REFLEX MICROSCOPIC
Bilirubin Urine: NEGATIVE
Hgb urine dipstick: NEGATIVE
Ketones, ur: NEGATIVE
Leukocytes,Ua: NEGATIVE
Nitrite: NEGATIVE
Specific Gravity, Urine: 1.025 (ref 1.000–1.030)
Total Protein, Urine: NEGATIVE
Urine Glucose: NEGATIVE
Urobilinogen, UA: 0.2 (ref 0.0–1.0)
pH: 6 (ref 5.0–8.0)

## 2021-01-05 LAB — LIPASE: Lipase: 32 U/L (ref 11.0–59.0)

## 2021-01-06 DIAGNOSIS — H353231 Exudative age-related macular degeneration, bilateral, with active choroidal neovascularization: Secondary | ICD-10-CM | POA: Diagnosis not present

## 2021-01-06 NOTE — Assessment & Plan Note (Signed)
Given his lower abdominal pain, we need to check for UTI.  Urine culture pending.  Reasonable to check c-Met CBC and lipase given the abdominal discomfort.  At this point still okay for outpatient follow-up.  I do not know if his symptoms are in any way related praulent, but I think it makes sense to hold his next dose just in case that is contributing.  If his labs are unremarkable and if he continues to have discomfort we may need to image his abdomen but I would hold off on doing that right now.  He agrees to plan.  31 minutes were devoted to patient care in this encounter (this includes time spent reviewing the patient's file/history, interviewing and examining the patient, counseling/reviewing plan with patient).

## 2021-01-10 ENCOUNTER — Other Ambulatory Visit: Payer: Self-pay | Admitting: Family Medicine

## 2021-01-10 DIAGNOSIS — R1030 Lower abdominal pain, unspecified: Secondary | ICD-10-CM

## 2021-01-21 ENCOUNTER — Ambulatory Visit (INDEPENDENT_AMBULATORY_CARE_PROVIDER_SITE_OTHER): Payer: PPO | Admitting: Family Medicine

## 2021-01-21 ENCOUNTER — Encounter: Payer: Self-pay | Admitting: Family Medicine

## 2021-01-21 ENCOUNTER — Other Ambulatory Visit: Payer: Self-pay

## 2021-01-21 VITALS — BP 138/82 | HR 76 | Temp 98.2°F | Ht 66.0 in | Wt 150.0 lb

## 2021-01-21 DIAGNOSIS — K2270 Barrett's esophagus without dysplasia: Secondary | ICD-10-CM | POA: Diagnosis not present

## 2021-01-21 DIAGNOSIS — R1030 Lower abdominal pain, unspecified: Secondary | ICD-10-CM | POA: Diagnosis not present

## 2021-01-21 DIAGNOSIS — E782 Mixed hyperlipidemia: Secondary | ICD-10-CM | POA: Diagnosis not present

## 2021-01-21 DIAGNOSIS — M545 Low back pain, unspecified: Secondary | ICD-10-CM | POA: Diagnosis not present

## 2021-01-21 DIAGNOSIS — G2581 Restless legs syndrome: Secondary | ICD-10-CM | POA: Diagnosis not present

## 2021-01-21 DIAGNOSIS — Z7189 Other specified counseling: Secondary | ICD-10-CM

## 2021-01-21 NOTE — Patient Instructions (Signed)
Stay off praluent for now.  We'll get the CT and then go from there.  Take care.  Glad to see you.

## 2021-01-21 NOTE — Progress Notes (Signed)
This visit occurred during the SARS-CoV-2 public health emergency.  Safety protocols were in place, including screening questions prior to the visit, additional usage of staff PPE, and extensive cleaning of exam room while observing appropriate contact time as indicated for disinfecting solutions.  He has less abd sx in the meantime.  CT pending for tomorrow.  He held praluent in the meantime, unclear if that contributes to sx/improvement.  His next dose would be on 01/24/21 but plan is to skip that dose.  He has skipped one dose so far.    Elevated Cholesterol: Using medications without problems: see above.   Muscle aches: occ.  Diet compliance: yes Exercise:yes  RLS.  On requip and iron at baseline.  Some occ aches but better with walking.  Sleeping "pretty good."  No ADE on meds.   Taking tramadol prn for back pain at baseline.  No ADE on med.  He is still working in the yard and able to tolerate that.    Advance directive-wife designated if patient were incapacitated. Son designated if wife were unavailable or incapacitated.  H/o Barrett's, on PPI.  D/w pt. Swallowing well.  Compliant with PPI.  Per patient/wife, likely had Zostavax previously.  Discussed.  PMH and SH reviewed  Meds, vitals, and allergies reviewed.   ROS: Per HPI unless specifically indicated in ROS section   GEN: nad, alert and oriented HEENT: ncat NECK: supple w/o LA CV: rrr. PULM: ctab, no inc wob ABD: soft, +bs EXT: no edema SKIN: no acute rash

## 2021-01-22 ENCOUNTER — Ambulatory Visit
Admission: RE | Admit: 2021-01-22 | Discharge: 2021-01-22 | Disposition: A | Discharge status: Home / Self Care | Payer: PPO | Source: Ambulatory Visit | Attending: Family Medicine | Admitting: Family Medicine

## 2021-01-22 DIAGNOSIS — R109 Unspecified abdominal pain: Secondary | ICD-10-CM | POA: Diagnosis not present

## 2021-01-22 DIAGNOSIS — R1030 Lower abdominal pain, unspecified: Secondary | ICD-10-CM | POA: Diagnosis not present

## 2021-01-22 MED ORDER — IOHEXOL 300 MG/ML  SOLN
100.0000 mL | Freq: Once | INTRAMUSCULAR | Status: AC | PRN
  Administered 2021-01-22: 100 mL via INTRAVENOUS

## 2021-01-24 NOTE — Assessment & Plan Note (Signed)
Continue to hold Praluent in the meantime, until we get his CT resulted.  See notes on imaging.  He is improved in the meantime.

## 2021-01-24 NOTE — Assessment & Plan Note (Signed)
He has less abd sx in the meantime.  CT pending for tomorrow.  He held praluent in the meantime, unclear if that contributes to sx/improvement.  His next dose would be on 01/24/21 but plan is to skip that dose.  He has skipped one dose so far.    I question if this is related to Praluent.  See notes on imaging.

## 2021-01-24 NOTE — Assessment & Plan Note (Signed)
Continue pantoprazole. °

## 2021-01-24 NOTE — Assessment & Plan Note (Signed)
Advance directive-wife designated if patient were incapacitated. Son designated if wife were unavailable or incapacitated.

## 2021-01-24 NOTE — Assessment & Plan Note (Signed)
Continue Requip and iron.

## 2021-01-24 NOTE — Assessment & Plan Note (Signed)
Taking tramadol prn for back pain at baseline.  No ADE on med.  He is still working in the yard and able to tolerate that.  Would continue tramadol as needed.

## 2021-01-28 ENCOUNTER — Ambulatory Visit (INDEPENDENT_AMBULATORY_CARE_PROVIDER_SITE_OTHER): Payer: PPO

## 2021-01-28 ENCOUNTER — Telehealth: Payer: Self-pay | Admitting: Pharmacist

## 2021-01-28 ENCOUNTER — Telehealth: Payer: Self-pay | Admitting: *Deleted

## 2021-01-28 DIAGNOSIS — Z0000 Encounter for general adult medical examination without abnormal findings: Secondary | ICD-10-CM | POA: Diagnosis not present

## 2021-01-28 MED ORDER — PRALUENT 75 MG/ML ~~LOC~~ SOAJ: 75.0000 mg | SUBCUTANEOUS | 11 refills | Status: DC

## 2021-01-28 NOTE — Progress Notes (Signed)
Subjective:   [name] is a 87 y.o. male who presents for Medicare Annual/Subsequent preventive examination.  Review of Systems N/A      I connected with the patient today by telephone and verified that I am speaking with the correct person using two identifiers. Location patient: home Location nurse: work Persons participating in the telephone visit: patient, nurse.   I discussed the limitations, risks, security and privacy concerns of performing an evaluation and management service by telephone and the availability of in person appointments. I also discussed with the patient that there may be a patient responsible charge related to this service. The patient expressed understanding and verbally consented to this telephonic visit.        Cardiac Risk Factors include: advanced age (>12men, >73 women);hypertension;Other (see comment), Risk factor comments: hyperlipidemia     Objective:    Today's Vitals   There is no height or weight on file to calculate BMI.  Advanced Directives 01/28/2021 09/21/2020 09/21/2020 11/29/2019 05/15/2018 10/10/2017 11/29/2016  Does Patient Have a Medical Advance Directive? Yes Yes Yes Yes No Yes No  Type of Estate agent of [place];Living will Healthcare Power of [place];Living will Living will;Healthcare Power of [employer] Power of [place];Living will - Healthcare Power of [place];Living will -  Does patient want to make changes to medical advance directive? - No - Patient declined - - - - -  Copy of Healthcare Power of Attorney in Chart? No - copy requested No - copy requested No - copy requested No - copy requested - No - copy requested -  Would patient like information on creating a medical advance directive? - - - - No - Patient declined - -    Current Medications (verified) Outpatient Encounter Medications as of 01/28/2021  Medication Sig   acetaminophen (TYLENOL) 500 MG tablet Take 1,000 mg by mouth every 6 (six)  hours as needed for headache (pain).   AFLIBERCEPT IO Inject into the eye See admin instructions. Right eye injection every 4 weeks   Alirocumab (PRALUENT) 75 MG/ML SOAJ Inject 75 mg into the skin every 14 (fourteen) days.   Bevacizumab (AVASTIN IV) Place into the left eye See admin instructions. Left eye injection every 8 weeks   cetirizine (ZYRTEC) 10 MG tablet Take 10 mg by mouth daily as needed for allergies.   clopidogrel (PLAVIX) 75 MG tablet TAKE 1 TABLET BY MOUTH EVERY DAY   diltiazem (CARDIZEM CD) 180 MG 24 hr capsule Take 1 capsule (180 mg total) by mouth daily.   famotidine (PEPCID) 20 MG tablet TAKE 1 TABLET (20 MG TOTAL) BY MOUTH 2 (TWO) TIMES DAILY AS NEEDED FOR HEARTBURN OR INDIGESTION.   ferrous sulfate 325 (65 FE) MG tablet Take 1 tablet (325 mg total) by mouth daily with breakfast.   fluticasone (FLONASE) 50 MCG/ACT nasal spray Place 2 sprays into both nostrils daily as needed for allergies.   gemfibrozil (LOPID) 600 MG tablet TAKE 1 TABLET BY MOUTH TWICE A DAY   loperamide (IMODIUM) 2 MG capsule Take 1 capsule (2 mg total) by mouth as needed for diarrhea or loose stools.   Multiple Vitamins-Minerals (OCUVITE ADULT 50+ PO) Take 1 capsule by mouth daily with lunch.   nitroGLYCERIN (NITROSTAT) 0.4 MG SL tablet Place 1 tablet (0.4 mg total) under the tongue every 5 (five) minutes as needed for chest pain (max 3 doses in 15 minutes).   pantoprazole (PROTONIX) 40 MG tablet TAKE 1 TABLET BY MOUTH EVERY DAY IN THE  MORNING   Polyethyl Glycol-Propyl Glycol (SYSTANE) 0.4-0.3 % SOLN Place 1 drop into both eyes 3 (three) times daily as needed (dry eyes).   rOPINIRole (REQUIP) 0.5 MG tablet TAKE 1 TABLET BY MOUTH AT BEDTIME.   tamsulosin (FLOMAX) 0.4 MG CAPS capsule TAKE 1 CAPSULE BY MOUTH EVERY DAY   traMADol (ULTRAM) 50 MG tablet 1 tab every 8 hours as needed for pain.   vitamin B-12 (CYANOCOBALAMIN) 1000 MCG tablet Take 2 tablets (2,000 mcg total) by mouth daily.   No  facility-administered encounter medications on file as of 01/28/2021.    Allergies (verified) Angiotensin receptor blockers, Atorvastatin, Codeine, Lisinopril, Statins, and Gabapentin   History: Past Medical History:  Diagnosis Date   Back pain    improved with gabapentin as of 2015   Barrett esophagus 09/2003   Coronary artery calcification seen on CT scan    Diverticulitis    Diverticulosis    GERD (gastroesophageal reflux disease)    GI AVM (gastrointestinal arteriovenous vascular malformation)    Hearing loss    Hemorrhoids    History of Holter monitoring    a. holter 9/17: NSR, PVCs; no sustained arrhythmia; PVC burden 8%   History of nuclear stress test    a. Myoview 8/17: Intermediate risk, inferior ischemia, not gated   History of prostatitis    History of stroke    Hyperlipidemia    Hypertension    Macular degeneration, wet (HCC)    receiving intra-ocular injections ([place])   PUD (peptic ulcer disease)    Past Surgical History:  Procedure Laterality Date   EP IMPLANTABLE DEVICE N/A 12/24/2015   Procedure: Loop Recorder Insertion;  Surgeon: Hillis RangeJames Allred, MD;  Location: [facility] INVASIVE CV [facility];  Service: Cardiovascular;  Laterality: N/A;   INGUINAL HERNIA REPAIR  2001   LAPAROSCOPIC APPENDECTOMY N/A 07/07/2014   Procedure: APPENDECTOMY LAPAROSCOPIC;  Surgeon: Manus RuddMatthew Tsuei, MD;  Location: [facility] OR;  Service: General;  Laterality: N/A;   TEE WITHOUT CARDIOVERSION N/A 10/28/2015   Procedure: TRANSESOPHAGEAL ECHOCARDIOGRAM (TEE);  Surgeon: Laurey Moralealton S McLean, MD;  [facility];  Service: Cardiovascular;  Laterality: N/A;   Family History  Problem Relation Age of Onset   Heart attack Father    Coronary artery disease Father    Hypertension Mother    Pancreatic cancer Mother        Died at [id number].   Cancer Mother        Pancreatic cancer   Arthritis Sister    Hyperlipidemia Sister    Prostate cancer Brother    Cancer Brother        Bone Cancer secondary to Prostate Cancer    Bleeding Disorder Son    Diabetes Neg Hx    Social History   Socioeconomic History   Marital status: Married    Spouse name: Marcelina Morelmma Kirchhoff   Number of children: Not on file   Years of education: Not on file   Highest education level: Not on file  Occupational History   Occupation: Retired    Associate Professormployer: RETIRED  Tobacco Use   Smoking status: Former    Packs/day: 0.00    Years: 36.00    Pack years: 0.00    Types: Cigarettes   Smokeless tobacco: Never   Tobacco comments:    Quit in 1983  Vaping Use   Vaping Use: Never used  Substance and Sexual Activity   Alcohol use: No    Alcohol/week: 0.0 standard drinks   Drug use: No   Sexual activity: Not  on file  Other Topics Concern   Not on file  Social History Narrative   6th grade. Married Dec 30, 1950 1 son, 1 dtr - died at 5 months. Work - Management consultant, Engineer, drilling, Catering manager.   Retired-fulltime. "[employer]". ACP - yes DNR, yes short-term mechanical ventilarion, no heroic or futile measures in face of irreversible.   Social Determinants of Health   Financial Resource Strain: Low Risk    Difficulty of Paying Living Expenses: Not hard at all  Food Insecurity: No Food Insecurity   Worried About Programme researcher, broadcasting/film/video in the Last Year: Never true   Ran Out of Food in the Last Year: Never true  Transportation Needs: No Transportation Needs   Lack of Transportation (Medical): No   Lack of Transportation (Non-Medical): No  Physical Activity: Inactive   Days of Exercise per Week: 0 days   Minutes of Exercise per Session: 0 min  Stress: No Stress Concern Present   Feeling of Stress : Not at all  Social Connections: Not on file    Tobacco Counseling Counseling given: Not Answered Tobacco comments: Quit in 12/29/81   Clinical Intake:  Pre-visit preparation completed: Yes  Pain : No/denies pain     Nutritional Risks: None Diabetes: No  How often do you need to have someone help you when you read instructions, pamphlets, or other  written materials from your doctor or pharmacy?: 1 - Never  Diabetic: No Nutrition Risk Assessment:  Has the patient had any N/V/D within the last 2 months?  No  Does the patient have any non-healing wounds?  No  Has the patient had any unintentional weight loss or weight gain?  No   Diabetes:  Is the patient diabetic?  No  If diabetic, was a CBG obtained today?   N/A Did the patient bring in their glucometer from home?   N/A How often do you monitor your CBG's? N/A.   Financial Strains and Diabetes Management:  Are you having any financial strains with the device, your supplies or your medication?  N/A .  Does the patient want to be seen by Chronic Care Management for management of their diabetes?   N/A Would the patient like to be referred to a Nutritionist or for Diabetic Management?   N/A   Interpreter Needed?: No  Information entered by :: CJohnson, LPN   Activities of Daily Living In your present state of health, do you have any difficulty performing the following activities: 01/28/2021 09/21/2020  Hearing? Malvin Johns  Comment wears hearing aids hard of hearing  Vision? Y N  Comment has some vision issues -  Difficulty concentrating or making decisions? Malvin Johns  Comment has memory loss issues forgetful  Walking or climbing stairs? N N  Dressing or bathing? N Y  Doing errands, shopping? N N  Preparing Food and eating ? N -  Using the Toilet? N -  In the past six months, have you accidently leaked urine? N -  Do you have problems with loss of bowel control? N -  Managing your Medications? N -  Managing your Finances? N -  Housekeeping or managing your Housekeeping? N -  Some recent data might be hidden    Patient Care Team: Joaquim Nam, MD as PCP - General (Family Medicine) Corky Crafts, MD as PCP - Cardiology (Cardiology) Kennon Rounds as Physician Assistant (Cardiology)  Indicate any recent Medical Services you may have received from other than [facility]  providers in the  past year (date may be approximate).     Assessment:   This is a routine wellness examination for Damyn.  Hearing/Vision screen Vision Screening - Comments:: Patient gets annual eye exams   Dietary issues and exercise activities discussed: Current Exercise Habits: The patient does not participate in regular exercise at present, Exercise limited by: None identified   Goals Addressed             This Visit's Progress    Patient Stated       01/28/2021, I will maintain and continue medications as prescribed.         Depression Screen PHQ 2/9 Scores 01/28/2021 01/21/2021 11/29/2019 10/29/2018 10/10/2017 02/01/2017 09/01/2016  PHQ - 2 Score 0 0 0 0 0 1 0  PHQ- 9 Score 0 0 0 - 0 - -    Fall Risk Fall Risk  01/28/2021 11/29/2019 10/29/2018 10/10/2017 11/07/2016  Falls in the past year? 0 1 1 No No  Comment - tripped and fell - - -  Number falls in past yr: 0 0 0 - -  Injury with Fall? 0 0 - - -  Risk for fall due to : Medication side effect Medication side effect - - -  Follow up Falls evaluation completed;Falls prevention discussed Falls evaluation completed;Falls prevention discussed - - -    FALL RISK PREVENTION PERTAINING TO THE HOME:  Any stairs in or around the home? Yes  If so, are there any without handrails? No  Home free of loose throw rugs in walkways, pet beds, electrical cords, etc? Yes  Adequate lighting in your home to reduce risk of falls? Yes   ASSISTIVE DEVICES UTILIZED TO PREVENT FALLS:  Life alert? No  Use of a cane, walker or w/c? No  Grab bars in the bathroom? Yes  Shower chair or bench in shower? No  Elevated toilet seat or a handicapped toilet? No   TIMED UP AND GO:  Was the test performed?  N/A telephone visit .   Cognitive Function: MMSE - Mini Mental State Exam 01/28/2021 11/29/2019 10/10/2017  Not completed: Refused - -  Orientation to time - 5 5  Orientation to Place - 5 5  Registration - 3 3  Attention/ Calculation - 5 0  Recall - 3 3   Language- name 2 objects - - 0  Language- repeat - 1 1  Language- follow 3 step command - - 2  Language- follow 3 step command-comments - - unable to complete 1 step of 3 step command  Language- read & follow direction - - 0  Write a sentence - - 0  Copy design - - 0  Total score - - 19  Mini Cog  Mini-Cog screen was not completed. Patient wanted to skip this. Maximum score is 22. A value of 0 denotes this part of the MMSE was not completed or the patient failed this part of the Mini-Cog screening.       Immunizations Immunization History  Administered Date(s) Administered   Fluad Quad(high Dose 65+) 06/03/2020   Influenza Split 06/07/2011, 06/07/2012   Influenza Whole 07/31/2007, 06/04/2008, 06/03/2009, 05/20/2010   Influenza, High Dose Seasonal PF 05/14/2019   Influenza,inj,Quad PF,6+ Mos 06/26/2013, 06/11/2014, 05/26/2015, 04/26/2016, 05/22/2017, 06/01/2018   PFIZER(Purple Top)SARS-COV-2 Vaccination 11/16/2019, 12/10/2019, 06/24/2020   Pneumococcal Conjugate-13 08/12/2013   Pneumococcal Polysaccharide-23 07/29/2010   Td 07/29/2010   Tdap 12/22/2017    TDAP status: Up to date  Flu Vaccine status: Up to date  Pneumococcal vaccine status: Up  to date  Covid-19 vaccine status: Completed 3 vaccines. Second booster due, discussed with patient.   Qualifies for Shingles Vaccine? Yes   Zostavax completed No   Shingrix Completed?: No.    Education has been provided regarding the importance of this vaccine. Patient has been advised to call insurance company to determine out of pocket expense if they have not yet received this vaccine. Advised may also receive vaccine at local pharmacy or Health Dept. Verbalized acceptance and understanding.  Screening Tests Health Maintenance  Topic Date Due   Zoster Vaccines- Shingrix (1 of 2) Never done   COVID-19 Vaccine (4 - Booster for Pfizer series) 10/22/2020   INFLUENZA VACCINE  03/22/2021   TETANUS/TDAP  12/23/2027   Pneumococcal  Vaccine 53-28 Years old  Aged Out   HPV VACCINES  Aged Out    Health Maintenance  Health Maintenance Due  Topic Date Due   Zoster Vaccines- Shingrix (1 of 2) Never done   COVID-19 Vaccine (4 - Booster for Pfizer series) 10/22/2020    Colorectal cancer screening: No longer required.   Lung Cancer Screening: (Low Dose CT Chest recommended if Age 70-80 years, 30 pack-year currently smoking OR have quit w/in 15years.) does not qualify.   Additional Screening:  Hepatitis C Screening: does not qualify; Completed: N/A  Vision Screening: Recommended annual ophthalmology exams for early detection of glaucoma and other disorders of the eye. Is the patient up to date with their annual eye exam?  Yes  Who is the provider or what is the name of the office in which the patient attends annual eye exams? Dr. Arlys Theodor If pt is not established with a provider, would they like to be referred to a provider to establish care? No .   Dental Screening: Recommended annual dental exams for proper oral hygiene  Community Resource Referral / Chronic Care Management: CRR required this visit?  No   CCM required this visit?  No      Plan:     I have personally reviewed and noted the following in the patient's chart:   Medical and social history Use of alcohol, tobacco or illicit drugs  Current medications and supplements including opioid prescriptions. Patient is not currently taking opioid prescriptions. Functional ability and status Nutritional status Physical activity Advanced directives List of other physicians Hospitalizations, surgeries, and ER visits in previous 12 months Vitals Screenings to include cognitive, depression, and falls Referrals and appointments  In addition, I have reviewed and discussed with patient certain preventive protocols, quality metrics, and best practice recommendations. A written personalized care plan for preventive services as well as general preventive  health recommendations were provided to patient.   Due to this being a telephonic visit, the after visit summary with patients personalized plan was offered to patient via office or my-chart. Patient preferred to pick up at office at next visit or via mychart.   Jerrian, Mells, LPN   3/0/1601

## 2021-01-28 NOTE — Patient Instructions (Signed)
[name] , Thank you for taking time to come for your Medicare Wellness Visit. I appreciate your ongoing commitment to your health goals. Please review the following plan we discussed and let me know if I can assist you in the future.   Screening recommendations/referrals: Colonoscopy: no longer required  Recommended yearly ophthalmology/optometry visit for glaucoma screening and checkup Recommended yearly dental visit for hygiene and checkup  Vaccinations: Influenza vaccine: Up to date, completed 06/03/2020, due 03/2021 Pneumococcal vaccine: Completed series Tdap vaccine: Up to date, completed 12/22/2017, due 12/2027 Shingles vaccine: due, check with your insurance regarding coverage if interested    Covid-19: 3 doses completed, second booster due. Discussed with patient   Advanced directives: Please bring a copy of your POA (Power of [place]) and/or Living Will to your next appointment.   Conditions/risks identified: hypertension, hyperlipidemia   Next appointment: Follow up in one year for your annual wellness visit.   Preventive Care 7 Years and Older, Male Preventive care refers to lifestyle choices and visits with your health care provider that can promote health and wellness. What does preventive care include? A yearly physical exam. This is also called an annual well check. Dental exams once or twice a year. Routine eye exams. Ask your health care provider how often you should have your eyes checked. Personal lifestyle choices, including: Daily care of your teeth and gums. Regular physical activity. Eating a healthy diet. Avoiding tobacco and drug use. Limiting alcohol use. Practicing safe sex. Taking low doses of aspirin every day. Taking vitamin and mineral supplements as recommended by your health care provider. What happens during an annual well check? The services and screenings done by your health care provider during your annual well check will depend on your age,  overall health, lifestyle risk factors, and family history of disease. Counseling  Your health care provider may ask you questions about your: Alcohol use. Tobacco use. Drug use. Emotional well-being. Home and relationship well-being. Sexual activity. Eating habits. History of falls. Memory and ability to understand (cognition). Work and work Astronomer. Screening  You may have the following tests or measurements: Height, weight, and BMI. Blood pressure. Lipid and cholesterol levels. These may be checked every 5 years, or more frequently if you are over 34 years old. Skin check. Lung cancer screening. You may have this screening every year starting at age 66 if you have a 30-pack-year history of smoking and currently smoke or have quit within the past 15 years. Fecal occult blood test (FOBT) of the stool. You may have this test every year starting at age 24. Flexible sigmoidoscopy or colonoscopy. You may have a sigmoidoscopy every 5 years or a colonoscopy every 10 years starting at age 35. Prostate cancer screening. Recommendations will vary depending on your family history and other risks. Hepatitis C blood test. Hepatitis B blood test. Sexually transmitted disease (STD) testing. Diabetes screening. This is done by checking your blood sugar (glucose) after you have not eaten for a while (fasting). You may have this done every 1-3 years. Abdominal aortic aneurysm (AAA) screening. You may need this if you are a current or former smoker. Osteoporosis. You may be screened starting at age 17 if you are at high risk. Talk with your health care provider about your test results, treatment options, and if necessary, the need for more tests. Vaccines  Your health care provider may recommend certain vaccines, such as: Influenza vaccine. This is recommended every year. Tetanus, diphtheria, and acellular pertussis (Tdap, Td) vaccine.  You may need a Td booster every 10 years. Zoster vaccine.  You may need this after age 26. Pneumococcal 13-valent conjugate (PCV13) vaccine. One dose is recommended after age 15. Pneumococcal polysaccharide (PPSV23) vaccine. One dose is recommended after age 64. Talk to your health care provider about which screenings and vaccines you need and how often you need them. This information is not intended to replace advice given to you by your health care provider. Make sure you discuss any questions you have with your health care provider. Document Released: 09/04/2015 Document Revised: 04/27/2016 Document Reviewed: 06/09/2015 Elsevier Interactive Patient Education  2017 [place] Prevention in the Home Falls can cause injuries. They can happen to people of all ages. There are many things you can do to make your home safe and to help prevent falls. What can I do on the outside of my home? Regularly fix the edges of walkways and driveways and fix any cracks. Remove anything that might make you trip as you walk through a door, such as a raised step or threshold. Trim any bushes or trees on the path to your home. Use bright outdoor lighting. Clear any walking paths of anything that might make someone trip, such as rocks or tools. Regularly check to see if handrails are loose or broken. Make sure that both sides of any steps have handrails. Any raised decks and porches should have guardrails on the edges. Have any leaves, snow, or ice cleared regularly. Use sand or salt on walking paths during winter. Clean up any spills in your garage right away. This includes oil or grease spills. What can I do in the bathroom? Use night lights. Install grab bars by the toilet and in the tub and shower. Do not use towel bars as grab bars. Use non-skid mats or decals in the tub or shower. If you need to sit down in the shower, use a plastic, non-slip stool. Keep the floor dry. Clean up any water that spills on the floor as soon as it happens. Remove soap  buildup in the tub or shower regularly. Attach bath mats securely with double-sided non-slip rug tape. Do not have throw rugs and other things on the floor that can make you trip. What can I do in the bedroom? Use night lights. Make sure that you have a light by your bed that is easy to reach. Do not use any sheets or blankets that are too big for your bed. They should not hang down onto the floor. Have a firm chair that has side arms. You can use this for support while you get dressed. Do not have throw rugs and other things on the floor that can make you trip. What can I do in the kitchen? Clean up any spills right away. Avoid walking on wet floors. Keep items that you use a lot in easy-to-reach places. If you need to reach something above you, use a strong step stool that has a grab bar. Keep electrical cords out of the way. Do not use floor polish or wax that makes floors slippery. If you must use wax, use non-skid floor wax. Do not have throw rugs and other things on the floor that can make you trip. What can I do with my stairs? Do not leave any items on the stairs. Make sure that there are handrails on both sides of the stairs and use them. Fix handrails that are broken or loose. Make sure that handrails are as long as  the stairways. Check any carpeting to make sure that it is firmly attached to the stairs. Fix any carpet that is loose or worn. Avoid having throw rugs at the top or bottom of the stairs. If you do have throw rugs, attach them to the floor with carpet tape. Make sure that you have a light switch at the top of the stairs and the bottom of the stairs. If you do not have them, ask someone to add them for you. What else can I do to help prevent falls? Wear shoes that: Do not have high heels. Have rubber bottoms. Are comfortable and fit you well. Are closed at the toe. Do not wear sandals. If you use a stepladder: Make sure that it is fully opened. Do not climb a closed  stepladder. Make sure that both sides of the stepladder are locked into place. Ask someone to hold it for you, if possible. Clearly mark and make sure that you can see: Any grab bars or handrails. First and last steps. Where the edge of each step is. Use tools that help you move around (mobility aids) if they are needed. These include: Canes. Walkers. Scooters. Crutches. Turn on the lights when you go into a dark area. Replace any light bulbs as soon as they burn out. Set up your furniture so you have a clear path. Avoid moving your furniture around. If any of your floors are uneven, fix them. If there are any pets around you, be aware of where they are. Review your medicines with your doctor. Some medicines can make you feel dizzy. This can increase your chance of falling. Ask your doctor what other things that you can do to help prevent falls. This information is not intended to replace advice given to you by your health care provider. Make sure you discuss any questions you have with your health care provider. Document Released: 06/04/2009 Document Revised: 01/14/2016 Document Reviewed: 09/12/2014 Elsevier Interactive Patient Education  2017 [employer].

## 2021-01-28 NOTE — Progress Notes (Signed)
PCP notes:  Health Maintenance: Shingrix- due   Abnormal Screenings: none   Patient concerns: none   Nurse concerns: none   Next PCP appt.: none 

## 2021-01-28 NOTE — Telephone Encounter (Signed)
Called and spoke w/pt's son and stated that we sent in a lower dose of praluent and advised them to call back if to costly and they voiced understanding

## 2021-01-28 NOTE — Telephone Encounter (Signed)
Mrs. Mclure called triage stating that [name] dosage has been decreased and she needs Dr. Para March to send new prescription to the MyPraluent patient assistance program.

## 2021-01-28 NOTE — Telephone Encounter (Signed)
Martin Nam, MD  [name], Emeline Darling, RPH-CPP I suspect his pain is related to Praluent (at least at the previous dose), since he still on gemfibrozil in the meantime and his symptoms improved skipping Praluent.   Could you please help [place] make arrangements for him to decrease to 75 mg of Praluent?  Many thanks.

## 2021-01-28 NOTE — Telephone Encounter (Signed)
PT'S WIFE CALLED IN AND STATED THEY WILL CALL AND LET us KNOW IF UNAFFORDABLE

## 2021-01-29 MED ORDER — PRALUENT 75 MG/ML ~~LOC~~ SOAJ: 75.0000 mg | SUBCUTANEOUS | 3 refills | Status: DC

## 2021-01-29 NOTE — Telephone Encounter (Signed)
Printed.  Please send to patient assistance.  Thanks.

## 2021-02-01 NOTE — Telephone Encounter (Signed)
Rx has been faxed to assistance program as requested

## 2021-02-10 DIAGNOSIS — H43813 Vitreous degeneration, bilateral: Secondary | ICD-10-CM | POA: Diagnosis not present

## 2021-02-10 DIAGNOSIS — H353231 Exudative age-related macular degeneration, bilateral, with active choroidal neovascularization: Secondary | ICD-10-CM | POA: Diagnosis not present

## 2021-02-10 DIAGNOSIS — H35423 Microcystoid degeneration of retina, bilateral: Secondary | ICD-10-CM | POA: Diagnosis not present

## 2021-02-10 DIAGNOSIS — H353211 Exudative age-related macular degeneration, right eye, with active choroidal neovascularization: Secondary | ICD-10-CM | POA: Diagnosis not present

## 2021-02-17 ENCOUNTER — Other Ambulatory Visit: Payer: Self-pay | Admitting: Family Medicine

## 2021-03-17 DIAGNOSIS — H353221 Exudative age-related macular degeneration, left eye, with active choroidal neovascularization: Secondary | ICD-10-CM | POA: Diagnosis not present

## 2021-03-17 DIAGNOSIS — H353231 Exudative age-related macular degeneration, bilateral, with active choroidal neovascularization: Secondary | ICD-10-CM | POA: Diagnosis not present

## 2021-03-17 DIAGNOSIS — H353211 Exudative age-related macular degeneration, right eye, with active choroidal neovascularization: Secondary | ICD-10-CM | POA: Diagnosis not present

## 2021-03-19 ENCOUNTER — Telehealth: Payer: Self-pay | Admitting: Family Medicine

## 2021-03-19 NOTE — Chronic Care Management (AMB) (Signed)
  Chronic Care Management   Note  03/19/2021 Name: KIT MOLLETT MRN: [id number] DOB: 29-Jul-1926  [name] is a 87 y.o. year old male who is a primary care patient of Joaquim Nam, MD. I reached out to [name] by phone today in response to a referral sent by Mr. Iren Whipp Goethe's PCP, Joaquim Nam, MD.   Mr. Purohit was given information about Chronic Care Management services today including:  CCM service includes personalized support from designated clinical staff supervised by his physician, including individualized plan of care and coordination with other care providers 24/7 contact phone numbers for assistance for urgent and routine care needs. Service will only be billed when office clinical staff spend 20 minutes or more in a month to coordinate care. Only one practitioner may furnish and bill the service in a calendar month. The patient may stop CCM services at any time (effective at the end of the month) by phone call to the office staff.   Marcelina Morel  verbally agreed to assistance and services provided by embedded care coordination/care management team today.  Follow up plan:   [employer] manager, [employer] food

## 2021-04-09 ENCOUNTER — Telehealth: Payer: Self-pay | Admitting: Pharmacist

## 2021-04-09 NOTE — Progress Notes (Signed)
Chronic Care Management Pharmacy Assistant   Name: [name]  MRN: [id number] DOB: 05-22-1926  [name] is an 87 y.o. year old male who presents for his initial CCM visit with the clinical pharmacist.  Reason for Encounter: Initial Questions for CPP    Recent office visits:  01/21/21 Joaquim Nam, MD-Family Medicine (Abdominal pain, lower) Stay off praluent for now 01/04/21 Joaquim Nam, MD(Abdominal pain, lower) 12/03/20 Joaquim Nam, MD(Abdominal pain, lower)  Recent consult visits:  10/30/20 Tereso Newcomer T, PA-C-Cardiology (Coronary artery disease) Return in about 1 year   Hospital visits:  None in previous 6 months  Medications: Outpatient Encounter Medications as of 04/09/2021  Medication Sig   acetaminophen (TYLENOL) 500 MG tablet Take 1,000 mg by mouth every 6 (six) hours as needed for headache (pain).   AFLIBERCEPT IO Inject into the eye See admin instructions. Right eye injection every 4 weeks   Alirocumab (PRALUENT) 75 MG/ML SOAJ Inject 75 mg into the skin every 14 (fourteen) days.   Bevacizumab (AVASTIN IV) Place into the left eye See admin instructions. Left eye injection every 8 weeks   cetirizine (ZYRTEC) 10 MG tablet Take 10 mg by mouth daily as needed for allergies.   clopidogrel (PLAVIX) 75 MG tablet TAKE 1 TABLET BY MOUTH EVERY DAY   diltiazem (CARDIZEM CD) 180 MG 24 hr capsule TAKE 1 CAPSULE BY MOUTH EVERY DAY   famotidine (PEPCID) 20 MG tablet TAKE 1 TABLET (20 MG TOTAL) BY MOUTH 2 (TWO) TIMES DAILY AS NEEDED FOR HEARTBURN OR INDIGESTION.   ferrous sulfate 325 (65 FE) MG tablet Take 1 tablet (325 mg total) by mouth daily with breakfast.   fluticasone (FLONASE) 50 MCG/ACT nasal spray Place 2 sprays into both nostrils daily as needed for allergies.   gemfibrozil (LOPID) 600 MG tablet TAKE 1 TABLET BY MOUTH TWICE A DAY   loperamide (IMODIUM) 2 MG capsule Take 1 capsule (2 mg total) by mouth as needed for diarrhea or loose stools.   Multiple  Vitamins-Minerals (OCUVITE ADULT 50+ PO) Take 1 capsule by mouth daily with lunch.   nitroGLYCERIN (NITROSTAT) 0.4 MG SL tablet Place 1 tablet (0.4 mg total) under the tongue every 5 (five) minutes as needed for chest pain (max 3 doses in 15 minutes).   pantoprazole (PROTONIX) 40 MG tablet TAKE 1 TABLET BY MOUTH EVERY DAY IN THE MORNING   Polyethyl Glycol-Propyl Glycol (SYSTANE) 0.4-0.3 % SOLN Place 1 drop into both eyes 3 (three) times daily as needed (dry eyes).   rOPINIRole (REQUIP) 0.5 MG tablet TAKE 1 TABLET BY MOUTH AT BEDTIME.   tamsulosin (FLOMAX) 0.4 MG CAPS capsule TAKE 1 CAPSULE BY MOUTH EVERY DAY   traMADol (ULTRAM) 50 MG tablet 1 tab every 8 hours as needed for pain.   vitamin B-12 (CYANOCOBALAMIN) 1000 MCG tablet Take 2 tablets (2,000 mcg total) by mouth daily.   No facility-administered encounter medications on file as of 04/09/2021.   Have you seen any other providers since your last visit?  Spoke with patient wife who stated he saw eye doctor last week  Any changes in your medications or health?  Wife states no changes have been made to his medications, but last week he fell once or twice but did not get hurt  Any side effects from any medications?  Wife states that patient does not have any side effects from medications  Do you have an symptoms or problems not managed by your medications?  Wife states  that she does not believe he has any symptoms or problems not managed by medications  Any concerns about your health right now?  Wife states that patient felt dizzy and weak one day when he got up to walk across the room  Has your provider asked that you check blood pressure, blood sugar, or follow special diet at home?  Wife states that patient does not check blood pressure or blood sugar. He does not follow any type of special diet, he eats what he wants as long as it does not upset his stomach  Do you get any type of exercise on a regular basis?  Wife states that  patient is active in his garden and loves being outside  Can you think of a goal you would like to reach for your health?  Wife states that the patient would like to maintain his health  Do you have any problems getting your medications?  Wife states that patient does not have any problems with getting medications or the cost of medications from the pharmacy  Is there anything that you would like to discuss during the appointment?  Wife states that she can't think of anything to discuss about patient health or medications at this time  Please bring medications and supplements to appointment   Star Rating Drugs: None ID  Velvet Bathe Clinical Pharmacist Assistant [id number]   Time spent:35

## 2021-04-16 ENCOUNTER — Ambulatory Visit (INDEPENDENT_AMBULATORY_CARE_PROVIDER_SITE_OTHER): Payer: PPO | Admitting: Pharmacist

## 2021-04-16 ENCOUNTER — Other Ambulatory Visit: Payer: Self-pay

## 2021-04-16 DIAGNOSIS — I10 Essential (primary) hypertension: Secondary | ICD-10-CM

## 2021-04-16 DIAGNOSIS — K219 Gastro-esophageal reflux disease without esophagitis: Secondary | ICD-10-CM

## 2021-04-16 DIAGNOSIS — I2510 Atherosclerotic heart disease of native coronary artery without angina pectoris: Secondary | ICD-10-CM | POA: Diagnosis not present

## 2021-04-16 DIAGNOSIS — Z8673 Personal history of transient ischemic attack (TIA), and cerebral infarction without residual deficits: Secondary | ICD-10-CM

## 2021-04-16 DIAGNOSIS — E782 Mixed hyperlipidemia: Secondary | ICD-10-CM

## 2021-04-16 DIAGNOSIS — G2581 Restless legs syndrome: Secondary | ICD-10-CM

## 2021-04-16 NOTE — Progress Notes (Signed)
Chronic Care Management Pharmacy Note  04/16/2021 Name:  [name] MRN:  [id number] DOB:  1926/08/06  Summary: -Pt has been on Praluent 75 mg (lower dose) for ~2 months, no repeat lipid panel yet -Pt as been on gemfibrozil for > 10 years, most recent Triglyceride level was normal (84) in 11/2019 and has been < 150 since 2017; highest TRIG on record was 238 in 2009; pt may not need fibrate therapy any longer  Recommendations/Changes made from today's visit: -Recommend repeat lipid panel within next 1-2 months; consider d/c gemfibrozil as triglycerides would likely be controlled without a fibrate given threshold for treatment is generally >500    Subjective: [name] is an 87 y.o. year old male who is a primary patient of Damita Dunnings, Elveria Rising, MD.  The CCM team was consulted for assistance with disease management and care coordination needs.    Engaged with patient face to face for initial visit in response to provider referral for pharmacy case management and/or care coordination services.   Consent to Services:  The patient was given the following information about Chronic Care Management services today, agreed to services, and gave verbal consent: 1. CCM service includes personalized support from designated clinical staff supervised by the primary care provider, including individualized plan of care and coordination with other care providers 2. 24/7 contact phone numbers for assistance for urgent and routine care needs. 3. Service will only be billed when office clinical staff spend 20 minutes or more in a month to coordinate care. 4. Only one practitioner may furnish and bill the service in a calendar month. 5.The patient may stop CCM services at any time (effective at the end of the month) by phone call to the office staff. 6. The patient will be responsible for cost sharing (co-pay) of up to 20% of the service fee (after annual deductible is met). Patient agreed to services and consent  obtained.  Patient Care Team: Tonia Ghent, MD as PCP - General (Family Medicine) Jettie Booze, MD as PCP - Cardiology (Cardiology) Sharmon Revere as Physician Assistant (Cardiology) Charlton Haws, [facility] as Pharmacist (Pharmacist)  Recent office visits: 01/21/21 Tonia Ghent, MD (Abdominal pain, lower): muscle aches - hold Praluent 01/04/21 Tonia Ghent, MD(Abdominal pain, lower) 12/03/20 Tonia Ghent, MD(Abdominal pain, lower)  Recent consult visits: 03/29/21 Shelby Dubin (Audiology): f/u hearing loss - hearing aid troubleshooting. 10/30/20 Richardson Dopp T, PA-C-Cardiology (Coronary artery disease) Return in about 1 year   Hospital visits: None in previous 6 months  Objective:  Lab Results  Component Value Date   CREATININE 1.15 01/04/2021   BUN 20 01/04/2021   GFR 54.97 (L) 01/04/2021   GFRNONAA >60 09/22/2020   GFRAA 74 05/19/2017   NA 140 01/04/2021   K 4.0 01/04/2021   CALCIUM 9.0 01/04/2021   CO2 26 01/04/2021   GLUCOSE 112 (H) 01/04/2021    Lab Results  Component Value Date/Time   HGBA1C 6.0 (H) 03/30/2015 07:50 AM   HGBA1C 6.1 (H) 01/29/2015 04:41 AM   GFR 54.97 (L) 01/04/2021 03:03 PM   GFR 57.40 (L) 12/03/2020 11:44 AM    Last diabetic Eye exam: No results found for: HMDIABEYEEXA  Last diabetic Foot exam: No results found for: HMDIABFOOTEX   Lab Results  Component Value Date   CHOL 138 12/02/2019   HDL 56.50 12/02/2019   LDLCALC 65 12/02/2019   LDLDIRECT 167.2 08/12/2013   TRIG 84.0 12/02/2019   CHOLHDL 2 12/02/2019  Hepatic Function Latest Ref Rng & Units 01/04/2021 12/03/2020 09/20/2020  Total Protein 6.0 - 8.3 g/dL 6.8 7.1 7.5  Albumin 3.5 - 5.2 g/dL 4.2 3.9 3.3(L)  AST 0 - 37 U/L 15 16 20   ALT 0 - 53 U/L 8 8 11   Alk Phosphatase 39 - 117 U/L 72 73 73  Total Bilirubin 0.2 - 1.2 mg/dL 0.3 0.4 1.1  Bilirubin, Direct 0.0 - 0.3 mg/dL - - -    Lab Results  Component Value Date/Time   TSH 2.225 09/21/2020 08:05  AM   TSH 2.59 01/27/2016 02:40 PM   TSH 4.41 08/12/2013 10:24 AM    CBC Latest Ref Rng & Units 01/04/2021 12/03/2020 09/28/2020  WBC 4.0 - 10.5 K/uL 6.1 6.1 6.5  Hemoglobin 13.0 - 17.0 g/dL 13.5 13.9 13.3  Hematocrit 39.0 - 52.0 % 40.2 41.5 40.2  Platelets 150.0 - 400.0 K/uL 221.0 250.0 388.0    No results found for: VD25OH  Clinical ASCVD: Yes  The ASCVD Risk score Mikey Bussing DC Jr., et al., 2013) failed to calculate for the following reasons:   The 2013 ASCVD risk score is only valid for ages 74 to 59    Depression screen PHQ 2/9 01/28/2021 01/21/2021 11/29/2019  Decreased Interest 0 0 0  Down, Depressed, Hopeless 0 0 0  PHQ - 2 Score 0 0 0  Altered sleeping 0 0 0  Tired, decreased energy 0 0 0  Change in appetite 0 0 0  Feeling bad or failure about yourself  0 0 0  Trouble concentrating 0 0 0  Moving slowly or fidgety/restless 0 0 0  Suicidal thoughts 0 0 0  PHQ-9 Score 0 0 0  Difficult doing work/chores Not difficult at all Not difficult at all Not difficult at all  Some recent data might be hidden     Social History   Tobacco Use  Smoking Status Former   Packs/day: 0.00   Years: 36.00   Pack years: 0.00   Types: Cigarettes  Smokeless Tobacco Never  Tobacco Comments   Quit in 1983   BP Readings from Last 3 Encounters:  01/21/21 138/82  01/04/21 128/74  12/03/20 122/80   Pulse Readings from Last 3 Encounters:  01/21/21 76  01/04/21 74  12/03/20 81   Wt Readings from Last 3 Encounters:  01/21/21 150 lb (68 kg)  01/04/21 151 lb (68.5 kg)  12/03/20 151 lb (68.5 kg)   BMI Readings from Last 3 Encounters:  01/21/21 24.21 kg/m  01/04/21 24.37 kg/m  12/03/20 24.37 kg/m    Assessment/Interventions: Review of patient past medical history, allergies, medications, health status, including review of consultants reports, laboratory and other test data, was performed as part of comprehensive evaluation and provision of chronic care management services.   SDOH:  (Social  Determinants of Health) assessments and interventions performed: Yes  SDOH Screenings   Alcohol Screen: Low Risk    Last Alcohol Screening Score (AUDIT): 0  Depression (PHQ2-9): Low Risk    PHQ-2 Score: 0  Financial Resource Strain: Low Risk    Difficulty of Paying Living Expenses: Not hard at all  Food Insecurity: No Food Insecurity   Worried About Charity fundraiser in the Last Year: Never true   Ran Out of Food in the Last Year: Never true  Housing: Low Risk    Last Housing Risk Score: 0  Physical Activity: Inactive   Days of Exercise per Week: 0 days   Minutes of Exercise per Session: 0 min  Social Connections: Not on file  Stress: No Stress Concern Present   Feeling of Stress : Not at all  Tobacco Use: Medium Risk   Smoking Tobacco Use: Former   Smokeless Tobacco Use: Never  Transportation Needs: No Transportation Needs   Lack of Transportation (Medical): No   Lack of Transportation (Non-Medical): No    CCM Care Plan  Allergies  Allergen Reactions   Angiotensin Receptor Blockers Swelling    Lip and face swelling prev noted   Atorvastatin Other (See Comments)    REACTION: muscles tense up    Codeine Nausea And Vomiting   Lisinopril Swelling and Other (See Comments)    Lips and face swelling   Statins Other (See Comments)    Muscle tense up   Gabapentin Other (See Comments)    Dizzy sensation    Medications Reviewed Today     Reviewed by Charlton Haws, [facility] (Pharmacist) on 04/16/21 at 1208  Med List Status: <None>   Medication Order Taking? Sig Documenting Provider Last Dose Status Informant  acetaminophen (TYLENOL) 500 MG tablet [id number] Yes Take 1,000 mg by mouth every 6 (six) hours as needed for headache (pain). [provider] Taking Active Spouse/Significant Other  AFLIBERCEPT IO [id number] Yes Inject into the eye See admin instructions. Right eye injection every 4 weeks [provider] Taking Active Spouse/Significant Other   Alirocumab (PRALUENT) 75 MG/ML SOAJ [id number] Yes Inject 75 mg into the skin every 14 (fourteen) days. Tonia Ghent, MD Taking Active   Bevacizumab Margarette Asal IV) [id number] Yes Place into the left eye See admin instructions. Left eye injection every 8 weeks [provider] Taking Active Spouse/Significant Other           Med Note Earna Coder Sep 28, 2020  2:12 PM)    cetirizine (ZYRTEC) 10 MG tablet [id number] Yes Take 10 mg by mouth daily as needed for allergies. [provider] Taking Active Spouse/Significant Other  clopidogrel (PLAVIX) 75 MG tablet [id number] Yes TAKE 1 TABLET BY MOUTH EVERY DAY Tonia Ghent, MD Taking Active   diltiazem (CARDIZEM CD) 180 MG 24 hr capsule [id number] Yes TAKE 1 CAPSULE BY MOUTH EVERY DAY Tonia Ghent, MD Taking Active   famotidine (PEPCID) 20 MG tablet [id number] Yes TAKE 1 TABLET (20 MG TOTAL) BY MOUTH 2 (TWO) TIMES DAILY AS NEEDED FOR HEARTBURN OR INDIGESTION. Tonia Ghent, MD Taking Active   ferrous sulfate 325 (65 FE) MG tablet [id number] Yes Take 1 tablet (325 mg total) by mouth daily with breakfast. Tonia Ghent, MD Taking Active Spouse/Significant Other  fluticasone (FLONASE) 50 MCG/ACT nasal spray [id number] Yes Place 2 sprays into both nostrils daily as needed for allergies. Tonia Ghent, MD Taking Active Spouse/Significant Other  gemfibrozil (LOPID) 600 MG tablet [id number] Yes TAKE 1 TABLET BY MOUTH TWICE A DAY Tonia Ghent, MD Taking Active   loperamide (IMODIUM) 2 MG capsule [id number] Yes Take 1 capsule (2 mg total) by mouth as needed for diarrhea or loose stools. Lavina Hamman, MD Taking Active   Multiple Vitamins-Minerals [facility] ADULT 50+ PO) [id number] Yes Take 1 capsule by mouth daily with lunch. [provider] Taking Active Spouse/Significant Other  nitroGLYCERIN (NITROSTAT) 0.4 MG SL tablet [id number] Yes Place 1 tablet (0.4 mg total) under the tongue every 5 (five) minutes as needed for  chest pain (max 3 doses in 15 minutes). Venia Carbon, MD Taking Active Spouse/Significant Other  Med Note Earna Coder Sep 28, 2020  2:12 PM)    pantoprazole (PROTONIX) 40 MG tablet [id number] Yes TAKE 1 TABLET BY MOUTH EVERY DAY IN THE MORNING Tonia Ghent, MD Taking Active   Polyethyl Glycol-Propyl Glycol (SYSTANE) 0.4-0.3 % SOLN [id number] Yes Place 1 drop into both eyes 3 (three) times daily as needed (dry eyes). [provider] Taking Active Spouse/Significant Other  rOPINIRole (REQUIP) 0.5 MG tablet [id number] Yes TAKE 1 TABLET BY MOUTH AT BEDTIME. Tonia Ghent, MD Taking Active   tamsulosin [facility]) 0.4 MG CAPS capsule [id number] Yes TAKE 1 CAPSULE BY MOUTH EVERY DAY Tonia Ghent, MD Taking Active Spouse/Significant Other  traMADol (ULTRAM) 50 MG tablet [id number] Yes 1 tab every 8 hours as needed for pain. Tonia Ghent, MD Taking Active   vitamin B-12 (CYANOCOBALAMIN) 1000 MCG tablet [id number] Yes Take 2 tablets (2,000 mcg total) by mouth daily. Tonia Ghent, MD Taking Active             Patient Active Problem List   Diagnosis Date Noted   Complicated UTI (urinary tract infection) 09/20/2020   Leukocytosis 09/20/2020   Generalized weakness 09/20/2020   Healthcare maintenance 01/27/2020   Unsteady 01/20/2019   Chest wall pain 05/27/2018   SI (sacroiliac) pain 03/26/2018   Testicle pain 08/06/2017   Dysuria 07/04/2017   Coronary artery disease involving native coronary artery of native heart without angina pectoris 05/04/2017   Prostatitis 03/28/2017   Cough 02/03/2017   Pain in joint, shoulder region 03/30/2016   Lower back pain 03/30/2016   Tennis elbow 03/30/2016   Iron deficiency 03/07/2016   Restless leg syndrome 03/04/2016   Atypical chest pain 02/26/2016   Leg pain 01/28/2016   Vitamin B 12 deficiency 01/28/2016   Knee pain 12/06/2015   Fatigue 12/06/2015   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/27/2015   Palpitations 04/03/2015   HLD  (hyperlipidemia) 04/03/2015   Cerebral infarction due to stenosis of cerebral artery ([place]) 04/03/2015   History of stroke    Advance care planning 08/21/2014   Memory change 08/21/2014   Acute diverticulitis 08/16/2014   Ectatic abdominal aorta ([place]) 08/16/2014   Vomiting    COPD exacerbation (HCC)    Abdominal pain, lower    GERD (gastroesophageal reflux disease) 05/29/2014   Left flank pain, chronic 01/22/2013   Hypokalemia 12/28/2012   Angioedema of lips 12/28/2012   Medicare annual wellness visit, subsequent 08/15/2011   Angiodysplasia of intestine 11/30/2009   Hearing loss 07/27/2009   DYSPHAGIA 08/21/2008   Barrett's esophagus 05/16/2008   TEMPOROMANDIBULAR JOINT DISORDER 09/62/8366   UMBILICAL HERNIA 29/47/6546   Essential hypertension 05/15/2007   HEMORRHOIDS 05/15/2007    Immunization History  Administered Date(s) Administered   Fluad Quad(high Dose 65+) 06/03/2020   Influenza Split 06/07/2011, 06/07/2012   Influenza Whole 07/31/2007, 06/04/2008, 06/03/2009, 05/20/2010   Influenza, High Dose Seasonal PF 05/14/2019   Influenza,inj,Quad PF,6+ Mos 06/26/2013, 06/11/2014, 05/26/2015, 04/26/2016, 05/22/2017, 06/01/2018   PFIZER(Purple Top)SARS-COV-2 Vaccination 11/16/2019, 12/10/2019, 06/24/2020   Pneumococcal Conjugate-13 08/12/2013   Pneumococcal Polysaccharide-23 07/29/2010   Td 07/29/2010   Tdap 12/22/2017    Conditions to be addressed/monitored:  Hypertension, Hyperlipidemia, Coronary Artery Disease, GERD, and BPH  Care Plan : [place]  Updates made by Charlton Haws, [place] since 04/16/2021 12:00 AM     Problem: Hypertension, Hyperlipidemia, Coronary Artery Disease, GERD, and BPH   Priority: High     Long-Range Goal: Disease management   Start Date: 04/16/2021  Expected End Date: 04/16/2022  This Visit's Progress: On track  Priority: High  Note:   Current Barriers:  Unable to independently monitor therapeutic efficacy Suboptimal  therapeutic regimen for HLD  Pharmacist Clinical Goal(s):  Patient will achieve adherence to monitoring guidelines and medication adherence to achieve therapeutic efficacy adhere to plan to optimize therapeutic regimen for HLD as evidenced by report of adherence to recommended medication management changes through collaboration with PharmD and provider.   Interventions: 1:1 collaboration with Tonia Ghent, MD regarding development and update of comprehensive plan of care as evidenced by provider attestation and co-signature Inter-disciplinary care team collaboration (see longitudinal plan of care) Comprehensive medication review performed; medication list updated in electronic medical record  Hypertension (BP goal <140/90) -Controlled - BP is at goal in recent office visits; pt is not checking at home; he endorses compliance with medication and denies s/sx of hypotension -Current treatment: Diltiazem CD 180 mg daily -Current dietary habits: vegetables from garden, meat 1-2 times per week -Current exercise habits: working in the yard, gardening -Educated on BP goals and benefits of medications for prevention of heart attack, stroke and kidney damage; -Counseled to monitor BP at home periodically -Recommended to continue current medication  Hyperlipidemia / CAD: (LDL goal < 70) -Controlled - pt reports compliance with Praluent every 2 weeks; most recent LDL is from 11/2019 and was at goal; Praluent dose was decreased in June 2022 and repeat lipids have not been checked -of note, triglycerides are normal now and have been < 150 for 5 years; pt has been on gemfibrozil for >10 years; the highest triglyceride level in this chart was 238 in 2009; gemfibrozil is known to reduce triglycerides by ~30-50% -Hx CAD, CVA -Current treatment: Praluent 75 mg q14 days - via PAP (approved 01/2021) Gemfibrozil 600 mg BID Clopidogrel 75 mg daily Nitroglycerin 0.4 mg SL prn - never used -Educated on  Cholesterol goals;  -Recommend repeat lipid panel within next 1-2 months; consider d/c gemfibrozil as triglycerides would likely be controlled without a fibrate given threshold for treatment is generally >500  Barrett's esophagus (Goal: manage symptoms) -Controlled - pt reports he sometimes will vomit if he lays down too soon after eating, but for the most part reflux issues are under control with current regimen -Current treatment  Pantoprazole 40 mg daily Famotidine 20 mg BID prn -Counseled to avoid laying horizontally for at least 2 hours after eating -Recommended to continue current medication  Pain (Goal: manage symptoms) -Controlled - per pt report back pain is most troublesome, under control with tramadol and tylenol -Current treatment  Tylenol 500 mg PRN Tramadol 50 mg q8h PRN -Recommended to continue current medication  RLS (Goal: manage symptoms) -Controlled - pt reports symptoms have resolved with ropinirole -Current treatment  Ropinirole 0.5 mg HS -Recommended to continue current medication  BPH (Goal: manage symptoms) -Controlled - pt reports waking up to urinate 2-3 times per night; he saw urologist earlier this year and no changes were made -Current treatment  Tamsulosin 0.4 mg daily -Recommended to continue current medication  Macular degeneration (Goal: prevent progression) -Controlled - pt reports he follows up regularly with eye doctor; he is using lubricant eye drops daily -Current treatment  Avastin eye injections Aflibercept eye injections Systane eye drops -Recommended to continue current medication  Health Maintenance -Vaccine gaps: covid booster, Shingrix (not mentioned in this visit) -Current therapy:  Loperamide 2 mg PRN Ferrous sulfate 325 mg daily Vitamin B12 2000 mcg daily Ocuvite Cetirizine 10 mg PRN  Fluticasone nasal spray PRN -Patient is satisfied with current therapy and denies issues -Recommended to continue current  medication  Patient Goals/Self-Care Activities Patient will:  - take medications as prescribed focus on medication adherence by routine check blood pressure periodically, document, and provide at future appointments -follow up with PCP in next 1-2 months for repeat cholesterol labs      Medication Assistance:  Praluent - approved 01/2021  Compliance/Adherence/Medication fill history: Care Gaps: Vaccines - Shingrix, covid booster  Star-Rating Drugs: None  Patient's preferred pharmacy is:  [id number] [place] [address] [id number]: [phone]: [phone] Uses pill box? No - prefers bottles Pt endorses 100% compliance  We discussed: Benefits of medication synchronization, packaging and delivery as well as enhanced pharmacist oversight with [employer]. Patient decided to: Continue current medication management strategy  Care Plan and Follow Up Patient Decision:  Patient agrees to Care Plan and Follow-up.  Plan: Telephone follow up appointment with care management team member scheduled for:  3 months  LCharlene Brooke PharmD, B[place] CPP Clinical Pharmacist [facility] [phone]

## 2021-04-16 NOTE — Patient Instructions (Signed)
Visit Information  Phone number for Pharmacist: [phone]  Thank you for meeting with me to discuss your medications! I look forward to working with you to achieve your health care goals. Below is a summary of what we talked about during the visit:   Goals Addressed             This Visit's Progress    Manage My Medicine       Timeframe:  Long-Range Goal Priority:  Medium Start Date:        04/16/21                     Expected End Date:   04/16/22                    Follow Up Date Nov 2022   - call for medicine refill 2 or 3 days before it runs out - call if I am sick and can't take my medicine - keep a list of all the medicines I take; vitamins and herbals too  -follow up with PCP in next 1-2 months for repeat cholesterol labs   Why is this important?   These steps will help you keep on track with your medicines.   Notes:         Care Plan : CCM Pharmacy Care Plan  Updates made by Kathyrn Sheriff, RPH since 04/16/2021 12:00 AM     Problem: Hypertension, Hyperlipidemia, Coronary Artery Disease, GERD, and BPH   Priority: High     Long-Range Goal: Disease management   Start Date: 04/16/2021  Expected End Date: 04/16/2022  This Visit's Progress: On track  Priority: High  Note:   Current Barriers:  Unable to independently monitor therapeutic efficacy Suboptimal therapeutic regimen for HLD  Pharmacist Clinical Goal(s):  Patient will achieve adherence to monitoring guidelines and medication adherence to achieve therapeutic efficacy adhere to plan to optimize therapeutic regimen for HLD as evidenced by report of adherence to recommended medication management changes through collaboration with PharmD and provider.   Interventions: 1:1 collaboration with Joaquim Nam, MD regarding development and update of comprehensive plan of care as evidenced by provider attestation and co-signature Inter-disciplinary care team collaboration (see longitudinal plan of  care) Comprehensive medication review performed; medication list updated in electronic medical record  Hypertension (BP goal <140/90) -Controlled - BP is at goal in recent office visits; pt is not checking at home; he endorses compliance with medication and denies s/sx of hypotension -Current treatment: Diltiazem CD 180 mg daily -Current dietary habits: vegetables from garden, meat 1-2 times per week -Current exercise habits: working in the yard, gardening -Educated on BP goals and benefits of medications for prevention of heart attack, stroke and kidney damage; -Counseled to monitor BP at home periodically -Recommended to continue current medication  Hyperlipidemia / CAD: (LDL goal < 70) -Controlled - pt reports compliance with Praluent every 2 weeks; most recent LDL is from 11/2019 and was at goal; Praluent dose was decreased in June 2022 and repeat lipids have not been checked -of note, triglycerides are normal now and have been < 150 for 5 years; pt has been on gemfibrozil for >10 years; the highest triglyceride level in this chart was 238 in 2009; gemfibrozil is known to reduce triglycerides by ~30-50% -Hx CAD, CVA -Current treatment: Praluent 75 mg q14 days - via PAP (approved 01/2021) Gemfibrozil 600 mg BID Clopidogrel 75 mg daily Nitroglycerin 0.4 mg SL prn - never used -Educated on  Cholesterol goals;  -Recommend repeat lipid panel within next 1-2 months; consider d/c gemfibrozil as triglycerides would likely be controlled without a fibrate given threshold for treatment is generally >500  Barrett's esophagus (Goal: manage symptoms) -Controlled - pt reports he sometimes will vomit if he lays down too soon after eating, but for the most part reflux issues are under control with current regimen -Current treatment  Pantoprazole 40 mg daily Famotidine 20 mg BID prn -Counseled to avoid laying horizontally for at least 2 hours after eating -Recommended to continue current  medication  Pain (Goal: manage symptoms) -Controlled - per pt report back pain is most troublesome, under control with tramadol and tylenol -Current treatment  Tylenol 500 mg PRN Tramadol 50 mg q8h PRN -Recommended to continue current medication  RLS (Goal: manage symptoms) -Controlled - pt reports symptoms have resolved with ropinirole -Current treatment  Ropinirole 0.5 mg HS -Recommended to continue current medication  BPH (Goal: manage symptoms) -Controlled - pt reports waking up to urinate 2-3 times per night; he saw urologist earlier this year and no changes were made -Current treatment  Tamsulosin 0.4 mg daily -Recommended to continue current medication  Macular degeneration (Goal: prevent progression) -Controlled - pt reports he follows up regularly with eye doctor; he is using lubricant eye drops daily -Current treatment  Avastin eye injections Aflibercept eye injections Systane eye drops -Recommended to continue current medication  Health Maintenance -Vaccine gaps: covid booster, Shingrix (not mentioned in this visit) -Current therapy:  Loperamide 2 mg PRN Ferrous sulfate 325 mg daily Vitamin B12 2000 mcg daily Ocuvite Cetirizine 10 mg PRN Fluticasone nasal spray PRN -Patient is satisfied with current therapy and denies issues -Recommended to continue current medication  Patient Goals/Self-Care Activities Patient will:  - take medications as prescribed focus on medication adherence by routine check blood pressure periodically, document, and provide at future appointments -follow up with PCP in next 1-2 months for repeat cholesterol labs       Mr. Sutch was given information about Chronic Care Management services today including:  CCM service includes personalized support from designated clinical staff supervised by his physician, including individualized plan of care and coordination with other care providers 24/7 contact phone numbers for assistance for  urgent and routine care needs. Standard insurance, coinsurance, copays and deductibles apply for chronic care management only during months in which we provide at least 20 minutes of these services. Most insurances cover these services at 100%, however patients may be responsible for any copay, coinsurance and/or deductible if applicable. This service may help you avoid the need for more expensive face-to-face services. Only one practitioner may furnish and bill the service in a calendar month. The patient may stop CCM services at any time (effective at the end of the month) by phone call to the office staff.  Patient agreed to services and verbal consent obtained.   The patient verbalized understanding of instructions, educational materials, and care plan provided today and declined offer to receive copy of patient instructions, educational materials, and care plan.  Telephone follow up appointment with pharmacy team member scheduled for: 3 months  Al Corpus, PharmD, [place], CPP Clinical Pharmacist [facility] [phone]

## 2021-04-18 ENCOUNTER — Telehealth: Payer: Self-pay | Admitting: Family Medicine

## 2021-04-18 DIAGNOSIS — E782 Mixed hyperlipidemia: Secondary | ICD-10-CM

## 2021-04-18 NOTE — Telephone Encounter (Signed)
Please call pt.  See about setting up a fasting lab visit to check his cholesterol in the next 1-2 months.  We may be able to stop gemfibrozil if his triglycerides are still under good control.  I put in the follow-up lab order.  Thanks.

## 2021-04-19 NOTE — Telephone Encounter (Signed)
Patient scheduled for fasting lab appt on 05/25/21 at 10:00 am.

## 2021-04-21 DIAGNOSIS — H35423 Microcystoid degeneration of retina, bilateral: Secondary | ICD-10-CM | POA: Diagnosis not present

## 2021-04-21 DIAGNOSIS — H353211 Exudative age-related macular degeneration, right eye, with active choroidal neovascularization: Secondary | ICD-10-CM | POA: Diagnosis not present

## 2021-04-21 DIAGNOSIS — H353231 Exudative age-related macular degeneration, bilateral, with active choroidal neovascularization: Secondary | ICD-10-CM | POA: Diagnosis not present

## 2021-04-21 DIAGNOSIS — H43813 Vitreous degeneration, bilateral: Secondary | ICD-10-CM | POA: Diagnosis not present

## 2021-05-05 ENCOUNTER — Other Ambulatory Visit: Payer: Self-pay | Admitting: Family Medicine

## 2021-05-05 NOTE — Telephone Encounter (Signed)
Last office visit 01/21/2021 for MWV.  Last refilled 01/04/2021 for #60 with 3 refills.  No future appointments with PCP.

## 2021-05-05 NOTE — Telephone Encounter (Signed)
Sent. Thanks.   

## 2021-05-25 ENCOUNTER — Other Ambulatory Visit: Payer: PPO

## 2021-05-25 ENCOUNTER — Other Ambulatory Visit (INDEPENDENT_AMBULATORY_CARE_PROVIDER_SITE_OTHER): Payer: PPO

## 2021-05-25 ENCOUNTER — Other Ambulatory Visit: Payer: Self-pay

## 2021-05-25 DIAGNOSIS — E782 Mixed hyperlipidemia: Secondary | ICD-10-CM

## 2021-05-25 LAB — LIPID PANEL
Cholesterol: 165 mg/dL (ref 0–200)
HDL: 64.7 mg/dL (ref >39.00)
LDL Cholesterol: 84 mg/dL (ref 0–99)
NonHDL: 99.82
Total CHOL/HDL Ratio: 3
Triglycerides: 77 mg/dL (ref 0.0–149.0)
VLDL: 15.4 mg/dL (ref 0.0–40.0)

## 2021-05-26 DIAGNOSIS — H353231 Exudative age-related macular degeneration, bilateral, with active choroidal neovascularization: Secondary | ICD-10-CM | POA: Diagnosis not present

## 2021-05-27 ENCOUNTER — Other Ambulatory Visit: Payer: Self-pay | Admitting: Family Medicine

## 2021-06-10 DIAGNOSIS — Z23 Encounter for immunization: Secondary | ICD-10-CM | POA: Diagnosis not present

## 2021-06-30 ENCOUNTER — Telehealth: Payer: Self-pay

## 2021-06-30 NOTE — Chronic Care Management (AMB) (Signed)
    Chronic Care Management Pharmacy Assistant   Name: CIPRIANO MILLIKAN  MRN: [id number] DOB: Nov 01, 1925    Reason for Encounter: Reminder Call   Conditions to be addressed/monitored: HTN, HLD, and COPD   Medications: Outpatient Encounter Medications as of 06/30/2021  Medication Sig   acetaminophen (TYLENOL) 500 MG tablet Take 1,000 mg by mouth every 6 (six) hours as needed for headache (pain).   AFLIBERCEPT IO Inject into the eye See admin instructions. Right eye injection every 4 weeks   Alirocumab (PRALUENT) 75 MG/ML SOAJ Inject 75 mg into the skin every 14 (fourteen) days.   Bevacizumab (AVASTIN IV) Place into the left eye See admin instructions. Left eye injection every 8 weeks   cetirizine (ZYRTEC) 10 MG tablet Take 10 mg by mouth daily as needed for allergies.   clopidogrel (PLAVIX) 75 MG tablet TAKE 1 TABLET BY MOUTH EVERY DAY   diltiazem (CARDIZEM CD) 180 MG 24 hr capsule TAKE 1 CAPSULE BY MOUTH EVERY DAY   famotidine (PEPCID) 20 MG tablet TAKE 1 TABLET (20 MG TOTAL) BY MOUTH 2 (TWO) TIMES DAILY AS NEEDED FOR HEARTBURN OR INDIGESTION.   ferrous sulfate 325 (65 FE) MG tablet Take 1 tablet (325 mg total) by mouth daily with breakfast.   fluticasone (FLONASE) 50 MCG/ACT nasal spray Place 2 sprays into both nostrils daily as needed for allergies.   loperamide (IMODIUM) 2 MG capsule Take 1 capsule (2 mg total) by mouth as needed for diarrhea or loose stools.   Multiple Vitamins-Minerals (OCUVITE ADULT 50+ PO) Take 1 capsule by mouth daily with lunch.   nitroGLYCERIN (NITROSTAT) 0.4 MG SL tablet Place 1 tablet (0.4 mg total) under the tongue every 5 (five) minutes as needed for chest pain (max 3 doses in 15 minutes).   pantoprazole (PROTONIX) 40 MG tablet TAKE 1 TABLET BY MOUTH EVERY DAY IN THE MORNING   Polyethyl Glycol-Propyl Glycol (SYSTANE) 0.4-0.3 % SOLN Place 1 drop into both eyes 3 (three) times daily as needed (dry eyes).   rOPINIRole (REQUIP) 0.5 MG tablet TAKE 1 TABLET BY MOUTH  AT BEDTIME.   tamsulosin (FLOMAX) 0.4 MG CAPS capsule TAKE 1 CAPSULE BY MOUTH EVERY DAY   traMADol (ULTRAM) 50 MG tablet TAKE 1 TABLET BY MOUTH EVERY 8 HOURS AS NEEDED FOR PAIN   vitamin B-12 (CYANOCOBALAMIN) 1000 MCG tablet Take 2 tablets (2,000 mcg total) by mouth daily.   No facility-administered encounter medications on file as of 06/30/2021.    [name] was contacted to remind him of his upcoming telephone visit with Al Corpus on 07/07/21 at 3:30pm. Patient was reminded to have all medications, supplements and any blood glucose and blood pressure readings available for review at appointment.   Are you having any problems with your medications? No  Do you have any concerns you like to discuss with the pharmacist? No    Star Rating Drugs: Medication:   Last Fill: Day Supply Cardizem CD 180mg   05/19/21 90  05/21/21, RPH,CPP notified  Al Corpus, [facility] Clincal Pharmacy Assistant [phone]  Total time spent for month CPA: 10 min.s

## 2021-07-01 DIAGNOSIS — H35423 Microcystoid degeneration of retina, bilateral: Secondary | ICD-10-CM | POA: Diagnosis not present

## 2021-07-01 DIAGNOSIS — H43813 Vitreous degeneration, bilateral: Secondary | ICD-10-CM | POA: Diagnosis not present

## 2021-07-01 DIAGNOSIS — H353231 Exudative age-related macular degeneration, bilateral, with active choroidal neovascularization: Secondary | ICD-10-CM | POA: Diagnosis not present

## 2021-07-07 ENCOUNTER — Ambulatory Visit (INDEPENDENT_AMBULATORY_CARE_PROVIDER_SITE_OTHER): Payer: PPO | Admitting: Pharmacist

## 2021-07-07 DIAGNOSIS — I2510 Atherosclerotic heart disease of native coronary artery without angina pectoris: Secondary | ICD-10-CM

## 2021-07-07 DIAGNOSIS — I10 Essential (primary) hypertension: Secondary | ICD-10-CM

## 2021-07-07 DIAGNOSIS — E782 Mixed hyperlipidemia: Secondary | ICD-10-CM

## 2021-07-07 NOTE — Progress Notes (Signed)
 Chronic Care Management Pharmacy Note  07/12/2021 Name:  [name] MRN:  [id number] DOB:  07/31/1926  Summary: -Verified with patient that he has stopped gemfibrozil as directed per PCP last month -Pt had COVID booster 06/10/21. Updated chart.  Recommendations/Changes made from today's visit: -Advised Flu vaccine ASAP  Follow up Plan: -CCM Health Concierge call in 3 mos for routine medication adherence/BP check -Pharmacist televisit in 6 months   Subjective: [name] is an 87 y.o. year old male who is a primary patient of Duncan, Graham S, MD.  The CCM team was consulted for assistance with disease management and care coordination needs.    Engaged with patient by telephone for follow up visit in response to provider referral for pharmacy case management and/or care coordination services.   Consent to Services:  The patient was given information about Chronic Care Management services, agreed to services, and gave verbal consent prior to initiation of services.  Please see initial visit note for detailed documentation.   Patient Care Team: Duncan, Graham S, MD as PCP - General (Family Medicine) Varanasi, Jayadeep S, MD as PCP - Cardiology (Cardiology) Weaver, Scott T, PA-C as Physician Assistant (Cardiology) Foltanski, Lindsey N, RPH as Pharmacist (Pharmacist)  Recent office visits: 01/21/21 Duncan, Graham S, MD (Abdominal pain, lower): muscle aches - hold Praluent 01/04/21 Duncan, Graham S, MD(Abdominal pain, lower) 12/03/20 Duncan, Graham S, MD(Abdominal pain, lower)  Recent consult visits: 03/29/21 Allison Schmidt (Audiology): f/u hearing loss - hearing aid troubleshooting. 10/30/20 Weaver, Scott T, PA-C-Cardiology (Coronary artery disease) Return in about 1 year   Hospital visits: None in previous 6 months  Objective:  Lab Results  Component Value Date   CREATININE 1.15 01/04/2021   BUN 20 01/04/2021   GFR 54.97 (L) 01/04/2021   GFRNONAA >60 09/22/2020   GFRAA  74 05/19/2017   NA 140 01/04/2021   K 4.0 01/04/2021   CALCIUM 9.0 01/04/2021   CO2 26 01/04/2021   GLUCOSE 112 (H) 01/04/2021    Lab Results  Component Value Date/Time   HGBA1C 6.0 (H) 03/30/2015 07:50 AM   HGBA1C 6.1 (H) 01/29/2015 04:41 AM   GFR 54.97 (L) 01/04/2021 03:03 PM   GFR 57.40 (L) 12/03/2020 11:44 AM    Last diabetic Eye exam: No results found for: HMDIABEYEEXA  Last diabetic Foot exam: No results found for: HMDIABFOOTEX   Lab Results  Component Value Date   CHOL 165 05/25/2021   HDL 64.70 05/25/2021   LDLCALC 84 05/25/2021   LDLDIRECT 167.2 08/12/2013   TRIG 77.0 05/25/2021   CHOLHDL 3 05/25/2021    Hepatic Function Latest Ref Rng & Units 01/04/2021 12/03/2020 09/20/2020  Total Protein 6.0 - 8.3 g/dL 6.8 7.1 7.5  Albumin 3.5 - 5.2 g/dL 4.2 3.9 3.3(L)  AST 0 - 37 U/L 15 16 20  ALT 0 - 53 U/L 8 8 11  Alk Phosphatase 39 - 117 U/L 72 73 73  Total Bilirubin 0.2 - 1.2 mg/dL 0.3 0.4 1.1  Bilirubin, Direct 0.0 - 0.3 mg/dL - - -    Lab Results  Component Value Date/Time   TSH 2.225 09/21/2020 08:05 AM   TSH 2.59 01/27/2016 02:40 PM   TSH 4.41 08/12/2013 10:24 AM    CBC Latest Ref Rng & Units 01/04/2021 12/03/2020 09/28/2020  WBC 4.0 - 10.5 K/uL 6.1 6.1 6.5  Hemoglobin 13.0 - 17.0 g/dL 13.5 13.9 13.3  Hematocrit 39.0 - 52.0 % 40.2 41.5 40.2  Platelets 150.0 - 400.0 K/uL 221.0 250.0   388.0    No results found for: VD25OH  Clinical ASCVD: Yes  The ASCVD Risk score (Arnett DK, et al., 2019) failed to calculate for the following reasons:   The 2019 ASCVD risk score is only valid for ages 40 to 79    Depression screen PHQ 2/9 01/28/2021 01/21/2021 11/29/2019  Decreased Interest 0 0 0  Down, Depressed, Hopeless 0 0 0  PHQ - 2 Score 0 0 0  Altered sleeping 0 0 0  Tired, decreased energy 0 0 0  Change in appetite 0 0 0  Feeling bad or failure about yourself  0 0 0  Trouble concentrating 0 0 0  Moving slowly or fidgety/restless 0 0 0  Suicidal thoughts 0 0 0  PHQ-9  Score 0 0 0  Difficult doing work/chores Not difficult at all Not difficult at all Not difficult at all  Some recent data might be hidden     Social History   Tobacco Use  Smoking Status Former   Packs/day: 0.00   Years: 36.00   Pack years: 0.00   Types: Cigarettes  Smokeless Tobacco Never  Tobacco Comments   Quit in 1983   BP Readings from Last 3 Encounters:  01/21/21 138/82  01/04/21 128/74  12/03/20 122/80   Pulse Readings from Last 3 Encounters:  01/21/21 76  01/04/21 74  12/03/20 81   Wt Readings from Last 3 Encounters:  01/21/21 150 lb (68 kg)  01/04/21 151 lb (68.5 kg)  12/03/20 151 lb (68.5 kg)   BMI Readings from Last 3 Encounters:  01/21/21 24.21 kg/m  01/04/21 24.37 kg/m  12/03/20 24.37 kg/m    Assessment/Interventions: Review of patient past medical history, allergies, medications, health status, including review of consultants reports, laboratory and other test data, was performed as part of comprehensive evaluation and provision of chronic care management services.   SDOH:  (Social Determinants of Health) assessments and interventions performed: Yes  SDOH Screenings   Alcohol Screen: Low Risk    Last Alcohol Screening Score (AUDIT): 0  Depression (PHQ2-9): Low Risk    PHQ-2 Score: 0  Financial Resource Strain: Low Risk    Difficulty of Paying Living Expenses: Not hard at all  Food Insecurity: No Food Insecurity   Worried About Running Out of Food in the Last Year: Never true   Ran Out of Food in the Last Year: Never true  Housing: Low Risk    Last Housing Risk Score: 0  Physical Activity: Inactive   Days of Exercise per Week: 0 days   Minutes of Exercise per Session: 0 min  Social Connections: Not on file  Stress: No Stress Concern Present   Feeling of Stress : Not at all  Tobacco Use: Medium Risk   Smoking Tobacco Use: Former   Smokeless Tobacco Use: Never   Passive Exposure: Not on file  Transportation Needs: No Transportation Needs    Lack of Transportation (Medical): No   Lack of Transportation (Non-Medical): No    CCM Care Plan  Allergies  Allergen Reactions   Angiotensin Receptor Blockers Swelling    Lip and face swelling prev noted   Atorvastatin Other (See Comments)    REACTION: muscles tense up    Codeine Nausea And Vomiting   Lisinopril Swelling and Other (See Comments)    Lips and face swelling   Statins Other (See Comments)    Muscle tense up   Gabapentin Other (See Comments)    Dizzy sensation    Medications Reviewed Today       Reviewed by Foltanski, Lindsey N, RPH (Pharmacist) on 07/09/21 at 1156  Med List Status: <None>   Medication Order Taking? Sig Documenting Provider Last Dose Status Informant  acetaminophen (TYLENOL) 500 MG tablet [id number] Yes Take 1,000 mg by mouth every 6 (six) hours as needed for headache (pain). [provider] Taking Active Spouse/Significant Other  AFLIBERCEPT IO [id number] Yes Inject into the eye See admin instructions. Right eye injection every 4 weeks [provider] Taking Active Spouse/Significant Other  Alirocumab (PRALUENT) 75 MG/ML SOAJ [id number] Yes Inject 75 mg into the skin every 14 (fourteen) days. Duncan, Graham S, MD Taking Active   Bevacizumab (AVASTIN IV) [id number] Yes Place into the left eye See admin instructions. Left eye injection every 8 weeks [provider] Taking Active Spouse/Significant Other           Med Note (DUNCAN, GRAHAM S   Mon Sep 28, 2020  2:12 PM)    cetirizine (ZYRTEC) 10 MG tablet [id number] Yes Take 10 mg by mouth daily as needed for allergies. [provider] Taking Active Spouse/Significant Other  clopidogrel (PLAVIX) 75 MG tablet [id number] Yes TAKE 1 TABLET BY MOUTH EVERY DAY Duncan, Graham S, MD Taking Active   diltiazem (CARDIZEM CD) 180 MG 24 hr capsule [id number] Yes TAKE 1 CAPSULE BY MOUTH EVERY DAY Duncan, Graham S, MD Taking Active   famotidine (PEPCID) 20 MG tablet [id number] Yes TAKE 1  TABLET (20 MG TOTAL) BY MOUTH 2 (TWO) TIMES DAILY AS NEEDED FOR HEARTBURN OR INDIGESTION. Duncan, Graham S, MD Taking Active   ferrous sulfate 325 (65 FE) MG tablet [id number] Yes Take 1 tablet (325 mg total) by mouth daily with breakfast. Duncan, Graham S, MD Taking Active Spouse/Significant Other  fluticasone (FLONASE) 50 MCG/ACT nasal spray [id number] Yes Place 2 sprays into both nostrils daily as needed for allergies. Duncan, Graham S, MD Taking Active Spouse/Significant Other  loperamide (IMODIUM) 2 MG capsule [id number] Yes Take 1 capsule (2 mg total) by mouth as needed for diarrhea or loose stools. Patel, Pranav M, MD Taking Active   Multiple Vitamins-Minerals (OCUVITE ADULT 50+ PO) [id number] Yes Take 1 capsule by mouth daily with lunch. [provider] Taking Active Spouse/Significant Other  nitroGLYCERIN (NITROSTAT) 0.4 MG SL tablet [id number] Yes Place 1 tablet (0.4 mg total) under the tongue every 5 (five) minutes as needed for chest pain (max 3 doses in 15 minutes). Letvak, Richard I, MD Taking Active Spouse/Significant Other           Med Note (DUNCAN, GRAHAM S   Mon Sep 28, 2020  2:12 PM)    pantoprazole (PROTONIX) 40 MG tablet [id number] Yes TAKE 1 TABLET BY MOUTH EVERY DAY IN THE MORNING Duncan, Graham S, MD Taking Active   Polyethyl Glycol-Propyl Glycol (SYSTANE) 0.4-0.3 % SOLN [id number] Yes Place 1 drop into both eyes 3 (three) times daily as needed (dry eyes). [provider] Taking Active Spouse/Significant Other  rOPINIRole (REQUIP) 0.5 MG tablet [id number] Yes TAKE 1 TABLET BY MOUTH AT BEDTIME. Duncan, Graham S, MD Taking Active   tamsulosin (FLOMAX) 0.4 MG CAPS capsule [id number] Yes TAKE 1 CAPSULE BY MOUTH EVERY DAY Duncan, Graham S, MD Taking Active Spouse/Significant Other  traMADol (ULTRAM) 50 MG tablet [id number] Yes TAKE 1 TABLET BY MOUTH EVERY 8 HOURS AS NEEDED FOR PAIN Duncan, Graham S, MD Taking Active   vitamin B-12 (CYANOCOBALAMIN) 1000 MCG tablet [id number] Yes  Take 2 tablets (2,000 mcg total) by mouth daily. Duncan, Graham S, MD Taking Active               Patient Active Problem List   Diagnosis Date Noted   Complicated UTI (urinary tract infection) 09/20/2020   Leukocytosis 09/20/2020   Generalized weakness 09/20/2020   Healthcare maintenance 01/27/2020   Unsteady 01/20/2019   Chest wall pain 05/27/2018   SI (sacroiliac) pain 03/26/2018   Testicle pain 08/06/2017   Dysuria 07/04/2017   Coronary artery disease involving native coronary artery of native heart without angina pectoris 05/04/2017   Prostatitis 03/28/2017   Cough 02/03/2017   Pain in joint, shoulder region 03/30/2016   Lower back pain 03/30/2016   Tennis elbow 03/30/2016   Iron deficiency 03/07/2016   Restless leg syndrome 03/04/2016   Atypical chest pain 02/26/2016   Leg pain 01/28/2016   Vitamin B 12 deficiency 01/28/2016   Knee pain 12/06/2015   Fatigue 12/06/2015   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/27/2015   Palpitations 04/03/2015   HLD (hyperlipidemia) 04/03/2015   Cerebral infarction due to stenosis of cerebral artery ([place]) 04/03/2015   History of stroke    Advance care planning 08/21/2014   Memory change 08/21/2014   Acute diverticulitis 08/16/2014   Ectatic abdominal aorta ([place]) 08/16/2014   Vomiting    COPD exacerbation (HCC)    Abdominal pain, lower    GERD (gastroesophageal reflux disease) 05/29/2014   Left flank pain, chronic 01/22/2013   Hypokalemia 12/28/2012   Angioedema of lips 12/28/2012   Medicare annual wellness visit, subsequent 08/15/2011   Angiodysplasia of intestine 11/30/2009   Hearing loss 07/27/2009   DYSPHAGIA 08/21/2008   Barrett's esophagus 05/16/2008   TEMPOROMANDIBULAR JOINT DISORDER 32/07/2481   UMBILICAL HERNIA 50/10/7046   Essential hypertension 05/15/2007   HEMORRHOIDS 05/15/2007    Immunization History  Administered Date(s) Administered   Fluad Quad(high Dose 65+) 06/03/2020   Influenza Split 06/07/2011, 06/07/2012   Influenza  Whole 07/31/2007, 06/04/2008, 06/03/2009, 05/20/2010   Influenza, High Dose Seasonal PF 05/14/2019   Influenza,inj,Quad PF,6+ Mos 06/26/2013, 06/11/2014, 05/26/2015, 04/26/2016, 05/22/2017, 06/01/2018   PFIZER(Purple Top)SARS-COV-2 Vaccination 11/16/2019, 12/10/2019, 06/24/2020   Pfizer Covid-19 Vaccine Bivalent Booster 55yr & up 06/10/2021   Pneumococcal Conjugate-13 08/12/2013   Pneumococcal Polysaccharide-23 07/29/2010   Td 07/29/2010   Tdap 12/22/2017    Conditions to be addressed/monitored:  Hypertension, Hyperlipidemia, Coronary Artery Disease, GERD, and BPH  Care Plan : [place] Updates made by FCharlton Haws RTupelosince 07/12/2021 12:00 AM     Problem: Hypertension, Hyperlipidemia, Coronary Artery Disease, GERD, and BPH   Priority: High     Long-Range Goal: Disease management   Start Date: 04/16/2021  Expected End Date: 04/16/2022  This Visit's Progress: On track  Recent Progress: On track  Priority: High  Note:   Current Barriers:  Unable to independently monitor therapeutic efficacy Suboptimal therapeutic regimen for HLD  Pharmacist Clinical Goal(s):  Patient will achieve adherence to monitoring guidelines and medication adherence to achieve therapeutic efficacy adhere to plan to optimize therapeutic regimen for HLD as evidenced by report of adherence to recommended medication management changes through collaboration with PharmD and provider.   Interventions: 1:1 collaboration with DTonia Ghent MD regarding development and update of comprehensive plan of care as evidenced by provider attestation and co-signature Inter-disciplinary care team collaboration (see longitudinal plan of care) Comprehensive medication review performed; medication list updated in electronic medical record  Hypertension (BP goal <140/90) -Controlled - BP is at goal in recent office visits; pt is not checking at home; he reports occasional dizziness/lightheadedness,  denies falls. -Current treatment: Diltiazem CD 180 mg daily -Current dietary habits: vegetables  from garden, meat 1-2 times per week -Current exercise habits: working in the yard, gardening -Educated on BP goals and benefits of medications for prevention of heart attack, stroke and kidney damage; Symptoms of hypotension and importance of maintaining adequate hydration; -Counseled to monitor BP at home periodically, especially when feeling dizzy/lightheaded; if BP is low may need to reduce diltiazem dose -Recommended to continue current medication  Hyperlipidemia / CAD: (LDL goal < 70) -Controlled - pt reports compliance with Praluent every 2 weeks; he verifies that he has stopped taking gemfibrozil as instructed per PCP in Oct 2022 after repeat lipid panel; LDL did increase somewhat since reducing Praluent over the summer but current control is reasonable given advanced age -Hx CAD, CVA -Current treatment: Praluent 75 mg q14 days - via PAP (approved 01/2021) Clopidogrel 75 mg daily Nitroglycerin 0.4 mg SL prn - never used -Educated on Cholesterol goals; importance of continued compliance with Praulent -Recommend to continue current medication  Health Maintenance -Vaccine gaps: covid booster, Shingrix, flu -Pt reports he had covid booster at health dept 06/10/21. Updated chart. -Advised pt to get Flu vaccine ASAP  Patient Goals/Self-Care Activities Patient will:  - take medications as prescribed -focus on medication adherence by routine -check blood pressure periodically -Get Flu vaccine at local pharmacy      Medication Assistance:  Praluent - approved 01/2021  Compliance/Adherence/Medication fill history: Care Gaps: Vaccines - Shingrix, covid booster  Star-Rating Drugs: None  Patient's preferred pharmacy is:  [id number] - [place], [place] - [address] [place] [address] Phone: [phone] Fax: [phone]  Uses pill box? No -  prefers bottles Pt endorses 100% compliance  We discussed: Benefits of medication synchronization, packaging and delivery as well as enhanced pharmacist oversight with [employer]. Patient decided to: Continue current medication management strategy  Care Plan and Follow Up Patient Decision:  Patient agrees to Care Plan and Follow-up.  Plan: Telephone follow up appointment with care management team member scheduled for:  6 months  Lindsey Foltanski, PharmD, BCACP Clinical Pharmacist [facility] [phone] 

## 2021-07-08 ENCOUNTER — Other Ambulatory Visit: Payer: Self-pay

## 2021-07-12 NOTE — Patient Instructions (Signed)
Visit Information  Phone number for Pharmacist: [phone]   Goals Addressed             This Visit's Progress    Manage My Medicine       Timeframe:  Long-Range Goal Priority:  Medium Start Date:        04/16/21                     Expected End Date:   04/16/22                    Follow Up Date May 2023   - call for medicine refill 2 or 3 days before it runs out - call if I am sick and can't take my medicine - keep a list of all the medicines I take; vitamins and herbals too    Why is this important?   These steps will help you keep on track with your medicines.   Notes:         Care Plan : CCM Pharmacy Care Plan  Updates made by Kathyrn Sheriff, RPH since 07/12/2021 12:00 AM     Problem: Hypertension, Hyperlipidemia, Coronary Artery Disease, GERD, and BPH   Priority: High     Long-Range Goal: Disease management   Start Date: 04/16/2021  Expected End Date: 04/16/2022  This Visit's Progress: On track  Recent Progress: On track  Priority: High  Note:   Current Barriers:  Unable to independently monitor therapeutic efficacy Suboptimal therapeutic regimen for HLD  Pharmacist Clinical Goal(s):  Patient will achieve adherence to monitoring guidelines and medication adherence to achieve therapeutic efficacy adhere to plan to optimize therapeutic regimen for HLD as evidenced by report of adherence to recommended medication management changes through collaboration with PharmD and provider.   Interventions: 1:1 collaboration with Joaquim Nam, MD regarding development and update of comprehensive plan of care as evidenced by provider attestation and co-signature Inter-disciplinary care team collaboration (see longitudinal plan of care) Comprehensive medication review performed; medication list updated in electronic medical record  Hypertension (BP goal <140/90) -Controlled - BP is at goal in recent office visits; pt is not checking at home; he reports  occasional dizziness/lightheadedness, denies falls. -Current treatment: Diltiazem CD 180 mg daily -Current dietary habits: vegetables from garden, meat 1-2 times per week -Current exercise habits: working in the yard, gardening -Educated on BP goals and benefits of medications for prevention of heart attack, stroke and kidney damage; Symptoms of hypotension and importance of maintaining adequate hydration; -Counseled to monitor BP at home periodically, especially when feeling dizzy/lightheaded; if BP is low may need to reduce diltiazem dose -Recommended to continue current medication  Hyperlipidemia / CAD: (LDL goal < 70) -Controlled - pt reports compliance with Praluent every 2 weeks; he verifies that he has stopped taking gemfibrozil as instructed per PCP in Oct 2022 after repeat lipid panel; LDL did increase somewhat since reducing Praluent over the summer but current control is reasonable given advanced age -Hx CAD, CVA -Current treatment: Praluent 75 mg q14 days - via PAP (approved 01/2021) Clopidogrel 75 mg daily Nitroglycerin 0.4 mg SL prn - never used -Educated on Cholesterol goals; importance of continued compliance with Praulent -Recommend to continue current medication  Health Maintenance -Vaccine gaps: covid booster, Shingrix, flu -Pt reports he had covid booster at health dept 06/10/21. Updated chart. -Advised pt to get Flu vaccine ASAP  Patient Goals/Self-Care Activities Patient will:  - take medications as prescribed -focus on medication  adherence by routine -check blood pressure periodically -Get Flu vaccine at local pharmacy        The patient verbalized understanding of instructions, educational materials, and care plan provided today and declined offer to receive copy of patient instructions, educational materials, and care plan.  Telephone follow up appointment with pharmacy team member scheduled for: 6 months  Al Corpus, PharmD, [facility] Clinical  Pharmacist [name] [phone]

## 2021-07-21 DIAGNOSIS — I2510 Atherosclerotic heart disease of native coronary artery without angina pectoris: Secondary | ICD-10-CM

## 2021-07-21 DIAGNOSIS — I10 Essential (primary) hypertension: Secondary | ICD-10-CM

## 2021-07-21 DIAGNOSIS — E782 Mixed hyperlipidemia: Secondary | ICD-10-CM | POA: Diagnosis not present

## 2021-08-02 DIAGNOSIS — H353231 Exudative age-related macular degeneration, bilateral, with active choroidal neovascularization: Secondary | ICD-10-CM | POA: Diagnosis not present

## 2021-08-02 DIAGNOSIS — H353221 Exudative age-related macular degeneration, left eye, with active choroidal neovascularization: Secondary | ICD-10-CM | POA: Diagnosis not present

## 2021-08-02 DIAGNOSIS — H353211 Exudative age-related macular degeneration, right eye, with active choroidal neovascularization: Secondary | ICD-10-CM | POA: Diagnosis not present

## 2021-08-05 ENCOUNTER — Other Ambulatory Visit: Payer: Self-pay | Admitting: Family Medicine

## 2021-08-10 DIAGNOSIS — H903 Sensorineural hearing loss, bilateral: Secondary | ICD-10-CM | POA: Diagnosis not present

## 2021-08-10 DIAGNOSIS — H6122 Impacted cerumen, left ear: Secondary | ICD-10-CM | POA: Diagnosis not present

## 2021-08-10 DIAGNOSIS — J3489 Other specified disorders of nose and nasal sinuses: Secondary | ICD-10-CM | POA: Diagnosis not present

## 2021-08-10 DIAGNOSIS — Z974 Presence of external hearing-aid: Secondary | ICD-10-CM | POA: Diagnosis not present

## 2021-08-30 ENCOUNTER — Encounter (HOSPITAL_COMMUNITY): Payer: Self-pay | Admitting: Emergency Medicine

## 2021-08-30 ENCOUNTER — Emergency Department (HOSPITAL_COMMUNITY)
Admission: EM | Admit: 2021-08-30 | Discharge: 2021-08-30 | Discharge status: Against Medical Advice | Payer: Medicare HMO | Attending: Emergency Medicine | Admitting: Emergency Medicine

## 2021-08-30 ENCOUNTER — Telehealth: Payer: Self-pay | Admitting: *Deleted

## 2021-08-30 ENCOUNTER — Emergency Department (HOSPITAL_COMMUNITY): Payer: Medicare HMO

## 2021-08-30 DIAGNOSIS — Z5321 Procedure and treatment not carried out due to patient leaving prior to being seen by health care provider: Secondary | ICD-10-CM | POA: Diagnosis not present

## 2021-08-30 DIAGNOSIS — R059 Cough, unspecified: Secondary | ICD-10-CM | POA: Insufficient documentation

## 2021-08-30 DIAGNOSIS — I10 Essential (primary) hypertension: Secondary | ICD-10-CM | POA: Insufficient documentation

## 2021-08-30 DIAGNOSIS — K449 Diaphragmatic hernia without obstruction or gangrene: Secondary | ICD-10-CM | POA: Diagnosis not present

## 2021-08-30 LAB — CBC WITH DIFFERENTIAL/PLATELET
Abs Immature Granulocytes: 0.03 10*3/uL (ref 0.00–0.07)
Basophils Absolute: 0.1 10*3/uL (ref 0.0–0.1)
Basophils Relative: 1 %
Eosinophils Absolute: 0.2 10*3/uL (ref 0.0–0.5)
Eosinophils Relative: 3 %
HCT: 47.6 % (ref 39.0–52.0)
Hemoglobin: 15.1 g/dL (ref 13.0–17.0)
Immature Granulocytes: 1 %
Lymphocytes Relative: 18 %
Lymphs Abs: 1.2 10*3/uL (ref 0.7–4.0)
MCH: 29.4 pg (ref 26.0–34.0)
MCHC: 31.7 g/dL (ref 30.0–36.0)
MCV: 92.8 fL (ref 80.0–100.0)
Monocytes Absolute: 0.5 10*3/uL (ref 0.1–1.0)
Monocytes Relative: 8 %
Neutro Abs: 4.6 10*3/uL (ref 1.7–7.7)
Neutrophils Relative %: 69 %
Platelets: 250 10*3/uL (ref 150–400)
RBC: 5.13 MIL/uL (ref 4.22–5.81)
RDW: 12.1 % (ref 11.5–15.5)
WBC: 6.6 10*3/uL (ref 4.0–10.5)
nRBC: 0 % (ref 0.0–0.2)

## 2021-08-30 LAB — COMPREHENSIVE METABOLIC PANEL
ALT: 15 U/L (ref 0–44)
AST: 21 U/L (ref 15–41)
Albumin: 3.6 g/dL (ref 3.5–5.0)
Alkaline Phosphatase: 63 U/L (ref 38–126)
Anion gap: 8 (ref 5–15)
BUN: 11 mg/dL (ref 8–23)
CO2: 23 mmol/L (ref 22–32)
Calcium: 8.6 mg/dL — ABNORMAL LOW (ref 8.9–10.3)
Chloride: 105 mmol/L (ref 98–111)
Creatinine, Ser: 0.99 mg/dL (ref 0.61–1.24)
GFR, Estimated: >60 mL/min (ref >60)
Glucose, Bld: 172 mg/dL — ABNORMAL HIGH (ref 70–99)
Potassium: 4.2 mmol/L (ref 3.5–5.1)
Sodium: 136 mmol/L (ref 135–145)
Total Bilirubin: 0.4 mg/dL (ref 0.3–1.2)
Total Protein: 6.7 g/dL (ref 6.5–8.1)

## 2021-08-30 NOTE — Telephone Encounter (Signed)
PLEASE NOTE: All timestamps contained within this report are represented as [place] Standard Time. CONFIDENTIALTY NOTICE: This fax transmission is intended only for the addressee. It contains information that is legally privileged, confidential or otherwise protected from use or disclosure. If you are not the intended recipient, you are strictly prohibited from reviewing, disclosing, copying using or disseminating any of this information or taking any action in reliance on or regarding this information. If you have received this fax in error, please notify us immediately by telephone so that we can arrange for its return to [place]. Phone: [phone], Toll-Free: [phone], Fax: [phone] Page: 1 of 2 Call Id: [id number] [facility] Day - Client TELEPHONE ADVICE RECORD AccessNurse Patient Name: [name] Gender: Male DOB: [birth year] Age: 88 Y 10 M 13 D Return Phone Number: [phone] (Primary) Address: [place]  [address] Client [facility] Day - Client Client Site [facility] - Day Provider Raechel Ache - MD Contact Type Call Who Is Calling Patient / Member / Family / Caregiver Call Type Triage / Clinical Caller Name Burr Soffer Relationship To Patient Spouse Return Phone Number [phone] (Primary) Chief Complaint Dizziness Reason for Call Symptomatic / Request for Health Information Initial Comment Caller states that her husband is dizzy and a high blood pressure of 174/101. Translation No Nurse Assessment Nurse: Annye English, RN, Denise Date/Time (Eastern Time): 08/30/2021 9:37:27 AM Confirm and document reason for call. If symptomatic, describe symptoms. ---Caller states that her husband is dizzy and a high blood pressure of 174/101. Does the patient have any new or worsening symptoms? ---Yes Will a triage be completed? ---Yes Related visit to physician within the last 2 weeks?  ---No Does the PT have any chronic conditions? (i.e. diabetes, asthma, this includes High risk factors for pregnancy, etc.) ---Yes List chronic conditions. ---HTN Is this a behavioral health or substance abuse call? ---No Guidelines Guideline Title Affirmed Question Affirmed Notes Nurse Date/Time (Eastern Time) Blood Pressure - High [1] Systolic BP >= 160 OR Diastolic >= 100 AND [2] cardiac or neurologic symptoms (e.g., chest pain, difficulty breathing, unsteady gait, blurred vision) Carmon, RN, Denise 08/30/2021 9:38:47 AM Disp. Time Lamount Cohen Time) Disposition Final User PLEASE NOTE: All timestamps contained within this report are represented as [place] Standard Time. CONFIDENTIALTY NOTICE: This fax transmission is intended only for the addressee. It contains information that is legally privileged, confidential or otherwise protected from use or disclosure. If you are not the intended recipient, you are strictly prohibited from reviewing, disclosing, copying using or disseminating any of this information or taking any action in reliance on or regarding this information. If you have received this fax in error, please notify us immediately by telephone so that we can arrange for its return to [place]. Phone: [phone], Toll-Free: [phone], Fax: [phone] Page: 2 of 2 Call Id: [id number] 08/30/2021 9:40:39 AM Go to ED Now Yes Annye English, RN, Leighton Ruff Disagree/Comply Comply Caller Understands Yes PreDisposition Call Doctor Care Advice Given Per Guideline GO TO ED NOW: NOTE TO TRIAGER - DRIVING: * Another adult should drive. CALL EMS 911 IF: * Becomes too weak to stand CARE ADVICE given per High Blood Pressure (Adult) guideline. Referrals [facility] - ED

## 2021-08-30 NOTE — ED Triage Notes (Signed)
Patient here for evaluation of high blood pressure. Patient wife states blood pressure this morning was 174/101. Patient has history of antihypertension and has not missed any doses of antihypertensives.BP in triage 164/81. Patient has no complaints other than a productive cough. Patient denies taking any decongestants.

## 2021-08-30 NOTE — Telephone Encounter (Signed)
Called and spoke to patient's wife and was advised that her husband is getting dressed now to go to [facility] ER. Patient's wife stated that their son is going to drive them. Patient's wife stated that he denies any chest pain but states that his head feels funny. Patient's wife stated that he has a history of mini strokes. Patient's wife was advised if his symptoms get worse before getting to the ER to call 911 and she verbalized understanding.

## 2021-08-30 NOTE — Telephone Encounter (Signed)
Noted. Will await ER report.  Thanks.  °

## 2021-08-30 NOTE — ED Notes (Signed)
Pt wife called his son to take them home

## 2021-08-30 NOTE — ED Provider Triage Note (Signed)
Emergency Medicine Provider Triage Evaluation Note  [name] , a 88 y.o. male  was evaluated in triage.  Pt complains of high blood pressure and cough.  Patient reports that he has had a cough over the last 2 to 3 weeks.  States that cough is producing white mucus.  Patient was started on 10-day course of doxycycline by ENT provider for excoriation at the tip of septal spur on the left nare.  Patient reports that this morning he checked his blood pressure after waking up and found systolic to be elevated in the 170s.  Patient took his antihypertensive medication and rechecked it and found blood pressure to have systolic in the [id number].  Review of Systems  Positive: Cough, hypertension Negative: Chest pain, shortness of breath, visual disturbance, numbness, weakness  Physical Exam  BP (!) 164/81 (BP Location: Left Arm)    Pulse 93    Temp 98.2 F (36.8 C) (Oral)    Resp 16    SpO2 96%  Gen:   Awake, no distress   Resp:  Normal effort, lungs clear to auscultation bilaterally MSK:   Moves extremities without difficulty Other:    Medical Decision Making  Medically screening exam initiated at 12:15 PM.  Appropriate orders placed.  Rowland Lathe was informed that the remainder of the evaluation will be completed by another provider, this initial triage assessment does not replace that evaluation, and the importance of remaining in the ED until their evaluation is complete.     Loni Beckwith, [place] 08/30/21 1217

## 2021-08-31 ENCOUNTER — Telehealth: Payer: Self-pay | Admitting: Family Medicine

## 2021-08-31 NOTE — Telephone Encounter (Signed)
See other tele encounter

## 2021-08-31 NOTE — Telephone Encounter (Signed)
Please get update on patient.  Thanks. 

## 2021-08-31 NOTE — Telephone Encounter (Signed)
Spoke with patient's wife Kara Mead. Patients BP this am was 167/81 and pulse was 93. Patient felt okay this morning when he got up and ate a good breakfast. Patient started to not feel good again and laid down to take a nap. Once he took a nap he felt fine again. I asked Kara Mead to recheck patients BP while I was on the phone with her and she did. BP was 146/76 and pulse was 76. Patient is taking his BP medication as prescribed.

## 2021-08-31 NOTE — Telephone Encounter (Signed)
[name] called in and stated that [name] BP was running high, and they were up there until 5pm, and they came home and they are trying to find out the results that were done in the hospital yesterday.

## 2021-08-31 NOTE — Telephone Encounter (Signed)
CXR and labs were unremarkable.  Sugar was up some but if not fasting then that is not significant.  How is he feeling today?  How his is BP?  Please let me know.  Thanks.

## 2021-09-01 NOTE — Telephone Encounter (Signed)
Spoke with patient's wife and advised to have patient continue as is and to update [place] as needed. Terrence Dupont stated that patient has still been doing okay today.

## 2021-09-01 NOTE — Telephone Encounter (Signed)
Given all of that I would continue as is.  Please update me/get scheduled as needed.

## 2021-09-07 ENCOUNTER — Other Ambulatory Visit: Payer: Self-pay | Admitting: Family Medicine

## 2021-09-07 NOTE — Telephone Encounter (Signed)
Refill request for TRAMADOL HCL 50 MG TABLET  LOV - 01/21/21 Next OV - not scheduled Last refill - 05/05/21 #60/3

## 2021-09-08 NOTE — Telephone Encounter (Signed)
Sent. Thanks.   

## 2021-09-09 DIAGNOSIS — H353231 Exudative age-related macular degeneration, bilateral, with active choroidal neovascularization: Secondary | ICD-10-CM | POA: Diagnosis not present

## 2021-09-09 DIAGNOSIS — H3540 Unspecified peripheral retinal degeneration: Secondary | ICD-10-CM | POA: Diagnosis not present

## 2021-09-09 DIAGNOSIS — H43813 Vitreous degeneration, bilateral: Secondary | ICD-10-CM | POA: Diagnosis not present

## 2021-09-09 DIAGNOSIS — H35423 Microcystoid degeneration of retina, bilateral: Secondary | ICD-10-CM | POA: Diagnosis not present

## 2021-09-09 DIAGNOSIS — H353211 Exudative age-related macular degeneration, right eye, with active choroidal neovascularization: Secondary | ICD-10-CM | POA: Diagnosis not present

## 2021-09-29 ENCOUNTER — Telehealth: Payer: Self-pay
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
## 2021-10-02 ENCOUNTER — Other Ambulatory Visit: Payer: Self-pay | Admitting: Family Medicine

## 2021-10-05 ENCOUNTER — Telehealth: Payer: Self-pay

## 2021-10-05 NOTE — Chronic Care Management (AMB) (Signed)
° ° °  Chronic Care Management Pharmacy Assistant   Name: [name]  MRN: [id number] DOB: [birth year]  Reason for Encounter: Reminder Call   Conditions to be addressed/monitored: HTN and COPD   Medications: Outpatient Encounter Medications as of 10/05/2021  Medication Sig   acetaminophen (TYLENOL) 500 MG tablet Take 1,000 mg by mouth every 6 (six) hours as needed for headache (pain).   AFLIBERCEPT IO Inject into the eye See admin instructions. Right eye injection every 4 weeks   Alirocumab (PRALUENT) 75 MG/ML SOAJ Inject 75 mg into the skin every 14 (fourteen) days.   Bevacizumab (AVASTIN IV) Place into the left eye See admin instructions. Left eye injection every 8 weeks   cetirizine (ZYRTEC) 10 MG tablet Take 10 mg by mouth daily as needed for allergies.   clopidogrel (PLAVIX) 75 MG tablet TAKE 1 TABLET BY MOUTH EVERY DAY   diltiazem (CARDIZEM CD) 180 MG 24 hr capsule TAKE 1 CAPSULE BY MOUTH EVERY DAY   famotidine (PEPCID) 20 MG tablet TAKE 1 TABLET (20 MG TOTAL) BY MOUTH 2 (TWO) TIMES DAILY AS NEEDED FOR HEARTBURN OR INDIGESTION.   ferrous sulfate 325 (65 FE) MG tablet Take 1 tablet (325 mg total) by mouth daily with breakfast.   fluticasone (FLONASE) 50 MCG/ACT nasal spray Place 2 sprays into both nostrils daily as needed for allergies.   loperamide (IMODIUM) 2 MG capsule Take 1 capsule (2 mg total) by mouth as needed for diarrhea or loose stools.   Multiple Vitamins-Minerals (OCUVITE ADULT 50+ PO) Take 1 capsule by mouth daily with lunch.   nitroGLYCERIN (NITROSTAT) 0.4 MG SL tablet Place 1 tablet (0.4 mg total) under the tongue every 5 (five) minutes as needed for chest pain (max 3 doses in 15 minutes).   pantoprazole (PROTONIX) 40 MG tablet TAKE 1 TABLET BY MOUTH EVERY DAY IN THE MORNING   Polyethyl Glycol-Propyl Glycol (SYSTANE) 0.4-0.3 % SOLN Place 1 drop into both eyes 3 (three) times daily as needed (dry eyes).   rOPINIRole (REQUIP) 0.5 MG tablet TAKE 1 TABLET BY MOUTH AT  BEDTIME.   tamsulosin (FLOMAX) 0.4 MG CAPS capsule TAKE 1 CAPSULE BY MOUTH EVERY DAY   traMADol (ULTRAM) 50 MG tablet TAKE 1 TABLET BY MOUTH EVERY 8 HOURS AS NEEDED FOR PAIN   vitamin B-12 (CYANOCOBALAMIN) 1000 MCG tablet Take 2 tablets (2,000 mcg total) by mouth daily.   No facility-administered encounter medications on file as of 10/05/2021.   [name] was contacted to remind of upcoming telephone visit with Charlene Brooke on 10/15/21 at 1:45pm. Patient was reminded to have all medications, supplements and any blood glucose and blood pressure readings available for review at appointment. If unable to reach, a voicemail was left for patient.    Are you having any problems with your medications? No   Do you have any concerns you like to discuss with the pharmacist? No   The last home BP was 10/04/21-147/74  82-P    Star Rating Drugs: Medication:  Last Fill: Day Supply No star medications identified   Diltiazem 180mg          08/15/21          90 Praluent 75mg /ml        02/17/21            84        Injects every 2 weeks Marjo Bicker CPP notified  Avel Sensor, [place] Assistant [phone]  Total time spent for month CPA: 10 min.

## 2021-10-08 ENCOUNTER — Telehealth: Payer: Self-pay

## 2021-10-11 ENCOUNTER — Other Ambulatory Visit: Payer: Self-pay | Admitting: Family Medicine

## 2021-10-11 DIAGNOSIS — G2581 Restless legs syndrome: Secondary | ICD-10-CM

## 2021-10-14 DIAGNOSIS — H353231 Exudative age-related macular degeneration, bilateral, with active choroidal neovascularization: Secondary | ICD-10-CM | POA: Diagnosis not present

## 2021-10-15 ENCOUNTER — Other Ambulatory Visit: Payer: Self-pay

## 2021-10-15 ENCOUNTER — Ambulatory Visit (INDEPENDENT_AMBULATORY_CARE_PROVIDER_SITE_OTHER): Payer: Medicare HMO | Admitting: Pharmacist

## 2021-10-15 DIAGNOSIS — E782 Mixed hyperlipidemia: Secondary | ICD-10-CM

## 2021-10-15 DIAGNOSIS — I2510 Atherosclerotic heart disease of native coronary artery without angina pectoris: Secondary | ICD-10-CM

## 2021-10-15 DIAGNOSIS — I10 Essential (primary) hypertension: Secondary | ICD-10-CM
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
## 2021-10-19 ENCOUNTER — Telehealth: Payer: Self-pay

## 2021-10-19 DIAGNOSIS — I2510 Atherosclerotic heart disease of native coronary artery without angina pectoris: Secondary | ICD-10-CM

## 2021-10-19 DIAGNOSIS — E782 Mixed hyperlipidemia: Secondary | ICD-10-CM

## 2021-10-19 DIAGNOSIS — I10 Essential (primary) hypertension: Secondary | ICD-10-CM | POA: Diagnosis not present

## 2021-10-19 NOTE — Telephone Encounter (Signed)
Noted, will see at OV tomorrow AM.  Thanks.
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
## 2021-10-19 NOTE — Telephone Encounter (Signed)
Pt already has appt scheduled with Dr Para March on 10/20/21 at 9 AM. Sending note to DR Para March and Shanda Bumps CMA.

## 2021-10-20 ENCOUNTER — Ambulatory Visit (INDEPENDENT_AMBULATORY_CARE_PROVIDER_SITE_OTHER): Payer: Medicare HMO | Admitting: Family Medicine

## 2021-10-20 ENCOUNTER — Other Ambulatory Visit: Payer: Self-pay

## 2021-10-20 ENCOUNTER — Encounter: Payer: Self-pay | Admitting: Family Medicine

## 2021-10-20 VITALS — BP 178/88 | HR 82 | Temp 98.0°F | Ht 66.0 in | Wt 160.0 lb

## 2021-10-20 DIAGNOSIS — I10 Essential (primary) hypertension: Secondary | ICD-10-CM

## 2021-10-20 LAB — BASIC METABOLIC PANEL
BUN: 16 mg/dL (ref 6–23)
CO2: 28 mEq/L (ref 19–32)
Calcium: 9.4 mg/dL (ref 8.4–10.5)
Chloride: 103 mEq/L (ref 96–112)
Creatinine, Ser: 1.13 mg/dL (ref 0.40–1.50)
GFR: 55.83 mL/min — ABNORMAL LOW (ref >60.00)
Glucose, Bld: 108 mg/dL — ABNORMAL HIGH (ref 70–99)
Potassium: 3.9 mEq/L (ref 3.5–5.1)
Sodium: 137 mEq/L (ref 135–145)

## 2021-10-20 MED ORDER — DOXAZOSIN MESYLATE 1 MG PO TABS: 0.5000 mg | ORAL_TABLET | Freq: Every day | ORAL | 3 refills | Status: DC
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
## 2021-10-20 NOTE — Patient Instructions (Signed)
I would continue your regular meds but if your BP is above 150/90, then take a half tab of doxazosin.   ?If you are frequently needing a half tab and your BP is still above 150/90 after 1 week, then increase to 1 tab a day.  ?If lightheaded, then cut back on the dose. Please update me next week.  ?Take care.  Glad to see you. ?

## 2021-10-20 NOTE — Assessment & Plan Note (Signed)
discussed options.  He was intolerant of ACE inhibitors.  He is already taking a calcium channel blocker and I would prefer not to add a beta-blocker and risk bradycardia.  Reasonable to try adding on doxazosin 0.5mg  a day if his blood pressure is elevated above 150/90.  If his blood pressure is lower than that he would not have to take the extra half tab of doxazosin.  If he is requiring the 0.5 mg dose of doxazosin most days and his blood pressure is still elevated he could increase to 1 mg a day in the future if needed.  If he is lightheaded I want him to stop doxazosin.  Routine cautions given to patient.  He agrees with plan.  He can update me next week. ?

## 2021-10-29 ENCOUNTER — Ambulatory Visit: Payer: Medicare HMO | Admitting: Family Medicine

## 2021-10-29 ENCOUNTER — Telehealth: Payer: Self-pay
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
## 2021-10-29 NOTE — Telephone Encounter (Signed)
Since his BP has trended downward I would continue as is.  Could he check his BP a few times in the middle of the day at rest and update me about those readings?  I would like to see that prior to changing his med.  Thanks.   ?

## 2021-10-29 NOTE — Telephone Encounter (Signed)
Received call from wife to give blood pressure readings. They are taking a whole pill of the doxazosin. He is taking in the am when he wakes up. The first readings of the day are before he takes the Doxazosin.  ?3/2 195/100  76     150/75 82  ?3/3 163/94  86    173/84 73 ?3/4  196/95   78 ?3/4   196/95  78 ?3/5  188/91  69    ?3/6  178/94  73 ?3/6  152/74  88 ?3/7  177/81  74  140/63  ?3/8 199/95 74  143/69 77 ?3/9  158/76  76 ?3/10  163/79  95 ?    ?

## 2021-10-29 NOTE — Telephone Encounter (Signed)
Called and spoke with patients wife per DPR. Advised to have patient continue as is for now and try taking BP in the middle of the day at rest and update [place] with those readings. Emma verbalized understanding.  ?

## 2021-11-03 ENCOUNTER — Telehealth: Payer: Self-pay | Admitting: Family Medicine

## 2021-11-03 MED ORDER — DOXAZOSIN MESYLATE 1 MG PO TABS: 2.0000 mg | ORAL_TABLET | Freq: Every day | ORAL | Status: DC

## 2021-11-03 NOTE — Telephone Encounter (Signed)
I am presuming he was still taking 1 mg of doxazosin on all of those blood pressure readings.  If that is not the case then let me know.   ? ?If he has been on 1 mg a day, the following would apply.  Since he is having usually elevated blood pressures, I would try increasing the doxazosin to 2mg  daily.  If he is getting lightheaded then let me know about that.  Please update me about his blood pressure in about a week, sooner if needed.  Thanks. ?

## 2021-11-03 NOTE — Addendum Note (Signed)
Addended by: Joaquim Nam on: 11/03/2021 10:36 PM ? ? Modules accepted: Orders ? ?

## 2021-11-03 NOTE — Telephone Encounter (Signed)
Pt wife called to give Dr Para March pt BP readings 10/30/21 at 7:33 am 165/86 pulse 72 12:58 136/67 84 8:31 160/82 83 ?                                                                              10/31/21 11:30 am 193/97 71 8:07 173/80 82 ?                                                                             11/01/21  6:48am 181/85 67 11:47 177/77 88 5:21 181/86 84 ?                                                                              11/02/21 8:02am 192/94 67 12:33 156/75 88  5:19 161/74 76 ?                                                                               11/03/21 176/92 73 ?

## 2021-11-05 NOTE — Telephone Encounter (Signed)
Spoke with patients wife about patients medication. Patient is taking 1 mg of doxazosin QD. I gave wife instructions per Dr. Para March is that is the case. Wife [name] verbalized understanding and will increase dose as instructed and will call back in 1 week with readings or sooner if needed. ?

## 2021-11-10 ENCOUNTER — Telehealth: Payer: Self-pay
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
## 2021-11-12 ENCOUNTER — Telehealth: Payer: Self-pay

## 2021-11-12 NOTE — Telephone Encounter (Signed)
Wife called and provided readings  ?3/11 165/86 P 72 ?136/67 p84 ?160/82 p 83 ?3/12 ?193/97 p 71 ?173/80 p 72 ?3/13 ?181/85 p 67 ?3/14 ?177/47 78 ?181/86 84 ?192/94 67 ?176/85 88 ?161/74 p 76 ? 3/15  ?176/92 p 73 ?3/16 ?182/83 p 74 ?159/84 p 74 ?3/17 ? 162/80 p 80 ?149/73 p 81 ?3/18  ?174/86 p 70  ?3/19 ?190/87 p 68 ?162/86 p 80 ?3/20 ?177/81 P 68 ?144/71 p 79 ?3/21 ?181/90 p 72 ?136/69 p 87 ?161/82 p 84 ?3/22 ?177/82  p 72 ?140/67 p 75 ?153/72 p 75 ?3/23 ?197/93 p 74 ?134/69 p 76 ?3/24  ?189/88 p 76 ?153/70 p 91 ? ? ? ?

## 2021-11-14 NOTE — Telephone Encounter (Signed)
Please check with patient and verify that he is taking a total of 2 mg of doxazosin in a 24-hour period.  According to the EMR he should be taking 2 of the 1 mg tablets at 1 time.  He has variable blood pressure readings.   ? ?Assuming he is taking 2 mg at 1 time, try splitting that into 1 mg twice a day and see if his blood pressure readings are more even.  Please update me about that in about 1 week. ? ?If he is not taking 2 mg at the same time then let me know.  Thanks. ?

## 2021-11-15 NOTE — Telephone Encounter (Signed)
Spoke with patient and wife and patient is taking the doxazosin 1 mg 2 tabs every am. I advised patient to take 1 tab in the am and 1 tab in the pm and update [place] in a week with readings. They both verbalized understanding.  ?

## 2021-11-18 DIAGNOSIS — H3540 Unspecified peripheral retinal degeneration: Secondary | ICD-10-CM | POA: Diagnosis not present

## 2021-11-18 DIAGNOSIS — H35423 Microcystoid degeneration of retina, bilateral: Secondary | ICD-10-CM | POA: Diagnosis not present

## 2021-11-18 DIAGNOSIS — H353231 Exudative age-related macular degeneration, bilateral, with active choroidal neovascularization: Secondary | ICD-10-CM | POA: Diagnosis not present

## 2021-11-18 DIAGNOSIS — H02132 Senile ectropion of right lower eyelid: Secondary | ICD-10-CM | POA: Diagnosis not present

## 2021-11-18 DIAGNOSIS — H43813 Vitreous degeneration, bilateral: Secondary | ICD-10-CM | POA: Diagnosis not present

## 2021-11-22 ENCOUNTER — Telehealth: Payer: Self-pay | Admitting: *Deleted

## 2021-11-22 DIAGNOSIS — R0989 Other specified symptoms and signs involving the circulatory and respiratory systems: Secondary | ICD-10-CM

## 2021-11-22 NOTE — Telephone Encounter (Signed)
Pt's wife called with BP readings  (any irregular heart rate readings is because BP cuff told her it was irregular) ? ?11/15/21: 163/74 p 82 (irregular) @ 12:03 pm       2nd reading:  143/62 p 70  @ 6:29 pm ?11/16/21: 150/78 p 74  @ 10:20 am                       2nd reading: 159/77 p 76  @ 7:03pm ?11/17/21  141/69 p 97  @ 1:29 am                        2nd reading: 144/68 p 78 (irregular) @ 5:17 pm ?11/18/21: 150/79 p 73 (before taking meds) @ 8:14 am     2nd reading: 162/63  p 74  @ 4:09 pm ?11/19/21: 141/78 p 85  @ 1:39 pm ?11/20/21:   126/74 p 87  @ 3:20pm ?11/21/21: 156/81 p 79 @ 11:13 am (cuff said he moved so she took it again)  2nd reading: 158/82 p 77 @ 11:18   ?3rd reading: 115/63 p 73 @ 4:25pm    4th reading: 136/70 p 79  @ 8:13pm ? ?11/22/21: 171/85 p 80  @11 :31am                         2nd reading:  164/73 p 102 (irregular) @ 1:53 pm ? ?CB # [phone] ?

## 2021-11-23 NOTE — Telephone Encounter (Signed)
Noted. Thanks.

## 2021-11-23 NOTE — Telephone Encounter (Signed)
[facility] is aware of Zio order.  ? ?Requested that a message be sent to their Triage Pool for them to contact the patient to set this up. They will mail the patient the monitor.  ? ?Please advise, thanks.  ? ?

## 2021-11-23 NOTE — Telephone Encounter (Signed)
Please provide more information. We do not usually order Zio's for patients that have never been seen here. It looks like this is a patient that has been seen at the [facility] street office, so you might want to coordinate with their office. I do not see anything in the chart that someone from our office was spoken to. Please clarify, as this is not our standard practice. Thanks! ?

## 2021-11-23 NOTE — Telephone Encounter (Signed)
Spoke with patients wife and discussed message from Dr. Para March with her. She verbalized understanding and will await call about monitor  ?

## 2021-11-23 NOTE — Telephone Encounter (Signed)
Given his age/situation and the range of BPs, those readings are reasonable and I wouldn't change his meds.   ? ?Given the pulse irregularity noted at home, I think it is reasonable to check an ambulatory monitor (to record his heart rate).  I preemptively put in the order and I am checking on getting that set up.  If that isn't okay with patient, then let me know.  Thanks.   ?

## 2021-11-23 NOTE — Telephone Encounter (Signed)
Dr Para March, this cannot be done at [facility] - see notes below.  ? ?If patiet has been seen in past by [facility] ([address]) then he will have to go back to them otherwise if wanting [facility] he will have to Establish care with one of their Providers before they can send him a Monitor.  ?

## 2021-11-24 NOTE — Telephone Encounter (Signed)
Please update patient about this.  I think it makes sense to route this through [facility] office so I put a referral back to that clinic.  Please check with them to see if they can set up the Zio patch.  Thanks. ?

## 2021-11-24 NOTE — Addendum Note (Signed)
Addended by: Joaquim Nam on: 11/24/2021 04:24 PM ? ? Modules accepted: Orders ? ?

## 2021-12-12 ENCOUNTER — Other Ambulatory Visit: Payer: Self-pay | Admitting: Family Medicine

## 2021-12-12 DIAGNOSIS — G2581 Restless legs syndrome: Secondary | ICD-10-CM

## 2021-12-14 ENCOUNTER — Ambulatory Visit (INDEPENDENT_AMBULATORY_CARE_PROVIDER_SITE_OTHER): Payer: Medicare HMO | Admitting: Family Medicine

## 2021-12-14 ENCOUNTER — Encounter: Payer: Self-pay | Admitting: Family Medicine

## 2021-12-14 VITALS — BP 160/68 | HR 71 | Temp 97.4°F | Ht 66.0 in | Wt 159.0 lb

## 2021-12-14 DIAGNOSIS — R5383 Other fatigue: Secondary | ICD-10-CM

## 2021-12-14 LAB — COMPREHENSIVE METABOLIC PANEL
ALT: 11 U/L (ref 0–53)
AST: 17 U/L (ref 0–37)
Albumin: 3.9 g/dL (ref 3.5–5.2)
Alkaline Phosphatase: 65 U/L (ref 39–117)
BUN: 14 mg/dL (ref 6–23)
CO2: 26 mEq/L (ref 19–32)
Calcium: 8.8 mg/dL (ref 8.4–10.5)
Chloride: 105 mEq/L (ref 96–112)
Creatinine, Ser: 0.92 mg/dL (ref 0.40–1.50)
GFR: 71.38 mL/min (ref >60.00)
Glucose, Bld: 98 mg/dL (ref 70–99)
Potassium: 4.1 mEq/L (ref 3.5–5.1)
Sodium: 137 mEq/L (ref 135–145)
Total Bilirubin: 0.5 mg/dL (ref 0.2–1.2)
Total Protein: 6.4 g/dL (ref 6.0–8.3)

## 2021-12-14 LAB — CBC WITH DIFFERENTIAL/PLATELET
Basophils Absolute: 0.1 10*3/uL (ref 0.0–0.1)
Basophils Relative: 0.6 % (ref 0.0–3.0)
Eosinophils Absolute: 0.3 10*3/uL (ref 0.0–0.7)
Eosinophils Relative: 3.3 % (ref 0.0–5.0)
HCT: 43.3 % (ref 39.0–52.0)
Hemoglobin: 14.5 g/dL (ref 13.0–17.0)
Lymphocytes Relative: 17.6 % (ref 12.0–46.0)
Lymphs Abs: 1.4 10*3/uL (ref 0.7–4.0)
MCHC: 33.5 g/dL (ref 30.0–36.0)
MCV: 90.2 fl (ref 78.0–100.0)
Monocytes Absolute: 0.5 10*3/uL (ref 0.1–1.0)
Monocytes Relative: 6.7 % (ref 3.0–12.0)
Neutro Abs: 5.7 10*3/uL (ref 1.4–7.7)
Neutrophils Relative %: 71.8 % (ref 43.0–77.0)
Platelets: 244 10*3/uL (ref 150.0–400.0)
RBC: 4.8 Mil/uL (ref 4.22–5.81)
RDW: 13.3 % (ref 11.5–15.5)
WBC: 8 10*3/uL (ref 4.0–10.5)

## 2021-12-14 LAB — TSH: TSH: 2.87 u[IU]/mL (ref 0.35–5.50)

## 2021-12-14 NOTE — Patient Instructions (Signed)
Skip alirocucmab/praluent for the next 2 doses.   ?Go to the lab on the way out.   If you have mychart we'll likely use that to update you.    ?Take care.  Glad to see you. ?

## 2021-12-14 NOTE — Progress Notes (Signed)
Weight is stable.  He is taking his BP at home, DBP usually controlled, sometimes with elevated SBP but sometimes SBP is still controlled.  ? ?Fatigue noted in the last few weeks.  No RLS sx with requip use.  H/o statin intolerance.  Still on praluent.  He has variable urine color, clear to yellow.  Not bloody/red.  No fevers, no chills.  No black stools.  Occ abd discomfort at the time of BM/diarrhea but not consistent or constant.  No vomiting.  No CP and not SOB.  No BLE edema.  No burning with urination.   ? ?Meds, vitals, and allergies reviewed.  ? ?ROS: Per HPI unless specifically indicated in ROS section  ? ?Nad ?Ncat ?Neck supple, no LA ?Rrr ?Ctab ?Abd soft, nontender.  Not distended. ?Skin well perfused ?No edema. ?

## 2021-12-15 NOTE — Assessment & Plan Note (Signed)
Unclear source.  Unclear if this is from Praluent.  Discussed options, would skip alirocucmab/praluent for the next 2 doses.   ?Check routine labs today. ?Not in distress and no significant findings noted on exam today. ?

## 2021-12-16 ENCOUNTER — Other Ambulatory Visit: Payer: Self-pay | Admitting: Family Medicine

## 2021-12-21 DIAGNOSIS — H52203 Unspecified astigmatism, bilateral: Secondary | ICD-10-CM | POA: Diagnosis not present

## 2021-12-21 DIAGNOSIS — Z961 Presence of intraocular lens: Secondary | ICD-10-CM | POA: Diagnosis not present

## 2021-12-21 NOTE — Telephone Encounter (Signed)
Is this patient set up to get a heart monitor (Zio) or are they being seen via consult prior to heart monitor? ?

## 2021-12-23 DIAGNOSIS — H353231 Exudative age-related macular degeneration, bilateral, with active choroidal neovascularization: Secondary | ICD-10-CM | POA: Diagnosis not present

## 2021-12-26 NOTE — Progress Notes (Deleted)
Cardiology Office Note:    Date:  12/26/2021   ID:  [name], DOB Dec 17, 1925, MRN [id number]  PCP:  Joaquim Nam, MD  [facility] Providers Cardiologist:  Lance Muss, MD Cardiology APP:  Beatrice Lecher, PA-C { Click to update primary MD,subspecialty MD or APP then REFRESH:1}  *** Referring MD: Joaquim Nam, MD   Chief Complaint:  No chief complaint on file. {Click here for Visit Info    :1}   Patient Profile: Hx of cryptogenic CVA in 6/16 Hx of splenic infarct  S/p ILR (at EOL; no longer monitored) Hypertension  Hyperlipidemia  COPD  von Willebrand's Disease Coronary artery disease  Coronary Ca2+ on prior CT  Myoview 2017: inf ischemia >> Med Rx Aortic atherosclerosis  GERD GI AVMs Barrett's esophagus     Prior CV studies: {Select studies to display:26339} Chest CT 04/06/16 IMPRESSION: 1. The nodule questioned on left shoulder films represents a small calcified granuloma within the superior segment of the left lower lobe. There also are small calcified splenic granulomas consistent with prior granulomatous disease. 2. Moderate thoracic aortic atherosclerosis. 3. Calcification of the coronary arteries diffusely. 4. Paraseptal emphysema.   Myoview 03/25/16 Medium size, moderate intensity (SDS 5) reversible inferior perfusion defect which could suggest ischemia. Study was not gated due to frequent ventricular ectopy. This is an intermediate risk study.   TEE 10/28/15 EF 55%, normal wall motion, grade 4 plaque descending thoracic aorta, trivial MR, normal RVSF, no right-to-left atrial shunt   Event monitor 8/16 Sinus rhythm Rare pacs and pvcs No sustained arrhythmias No afib   Echo 6/16 Mild LVH, EF 85%, normal wall motion, mildly reduced RVSF, PASP 37 mmHg  ***    History of Present Illness:   [name] is a 88 y.o. male with the above problem list.  He was last seen in March 2022.   He returns for f/u.  ***    Past Medical History:   Diagnosis Date   Back pain    improved with gabapentin as of 2015   Barrett esophagus 09/2003   Coronary artery calcification seen on CT scan    Diverticulitis    Diverticulosis    GERD (gastroesophageal reflux disease)    GI AVM (gastrointestinal arteriovenous vascular malformation)    Hearing loss    Hemorrhoids    History of Holter monitoring    a. holter 9/17: NSR, PVCs; no sustained arrhythmia; PVC burden 8%   History of nuclear stress test    a. Myoview 8/17: Intermediate risk, inferior ischemia, not gated   History of prostatitis    History of stroke    Hyperlipidemia    Hypertension    Macular degeneration, wet (HCC)    receiving intra-ocular injections ([place])   PUD (peptic ulcer disease)    Current Medications: No outpatient medications have been marked as taking for the 12/27/21 encounter (Appointment) with Tereso Newcomer T, PA-C.    Allergies:   Angiotensin receptor blockers, Atorvastatin, Codeine, Lisinopril, Statins, and Gabapentin   Social History   Tobacco Use   Smoking status: Former    Packs/day: 0.00    Years: 36.00    Pack years: 0.00    Types: Cigarettes   Smokeless tobacco: Never   Tobacco comments:    Quit in 1983  Vaping Use   Vaping Use: Never used  Substance Use Topics   Alcohol use: No    Alcohol/week: 0.0 standard drinks   Drug use: No    Family  Hx: The patient's family history includes Arthritis in his sister; Bleeding Disorder in his son; Cancer in his brother and mother; Coronary artery disease in his father; Heart attack in his father; Hyperlipidemia in his sister; Hypertension in his mother; Pancreatic cancer in his mother; Prostate cancer in his brother. There is no history of Diabetes.  ROS   EKGs/Labs/Other Test Reviewed:    EKG:  EKG is *** ordered today.  The ekg ordered today demonstrates ***  Recent Labs: 12/14/2021: ALT 11; BUN 14; Creatinine, Ser 0.92; Hemoglobin 14.5; Platelets 244.0; Potassium 4.1; Sodium 137; TSH 2.87    Recent Lipid Panel Recent Labs    05/25/21 0925  CHOL 165  TRIG 77.0  HDL 64.70  VLDL 15.4  LDLCALC 84     Risk Assessment/Calculations:   {Does this patient have ATRIAL FIBRILLATION?:[phone]}     Physical Exam:    VS:  There were no vitals taken for this visit.    Wt Readings from Last 3 Encounters:  12/14/21 159 lb (72.1 kg)  10/20/21 160 lb (72.6 kg)  01/21/21 150 lb (68 kg)    Physical Exam ***     ASSESSMENT & PLAN:   No problem-specific Assessment & Plan notes found for this encounter.   *** 1. Coronary artery disease involving native coronary artery of native heart without angina pectoris Coronary Ca2+ on prior CT.  He had a Myoview in 2017 with inferior ischemia. He has been managed medically. He is doing well w/o angina.  Continue clopidogrel, alirocumab.  F/u 1 year.    2. Essential hypertension The patient's blood pressure is controlled on his current regimen.  Continue current therapy.     3. Mixed hyperlipidemia Most recent LDL optimal.  Continue Alirocumab.     4. History of CVA (cerebrovascular accident) He had an ILR placed in 2016 and never had AFib documented.  His device is no longer active.          {Are you ordering a CV Procedure (e.g. stress test, cath, DCCV, TEE, etc)?   Press F2        :[id number]}  Dispo:  No follow-ups on file.   Medication Adjustments/Labs and Tests Ordered: Current medicines are reviewed at length with the patient today.  Concerns regarding medicines are outlined above.  Tests Ordered: No orders of the defined types were placed in this encounter.  Medication Changes: No orders of the defined types were placed in this encounter.  Signed, Tereso Newcomer, PA-C  12/26/2021 9:25 PM    [facility] [address] [place], [place], [place]  [address] Phone: [phone]; Fax: [phone]

## 2021-12-27 ENCOUNTER — Ambulatory Visit: Payer: Medicare HMO | Admitting: Physician Assistant

## 2021-12-31 ENCOUNTER — Telehealth: Payer: PPO

## 2022-01-02 ENCOUNTER — Other Ambulatory Visit: Payer: Self-pay | Admitting: Family Medicine

## 2022-01-03 MED ORDER — DOXAZOSIN MESYLATE 1 MG PO TABS: 1.0000 mg | ORAL_TABLET | Freq: Two times a day (BID) | ORAL | 3 refills | Status: DC

## 2022-01-03 NOTE — Telephone Encounter (Addendum)
Pt's wife called to say the dosing on the doxazosin is incorrect. He is taking 1 in am and 1 in pm. The directions sent to the pharmacy today are incorrect and the quantity is wrong. [name] this was discussed through recent phone calls. He is out of medication as of today. ?

## 2022-01-03 NOTE — Addendum Note (Signed)
Addended by: Joaquim Nam on: 01/03/2022 02:00 PM ? ? Modules accepted: Orders ? ?

## 2022-01-03 NOTE — Telephone Encounter (Signed)
Rx resent.  The old sig was used on the refill.   ?Shanda Bumps- please update pt/wife.  Let me know if they have trouble getting this refilled.   ?Thanks.  ?

## 2022-01-10 ENCOUNTER — Other Ambulatory Visit: Payer: Self-pay | Admitting: Family Medicine

## 2022-01-10 DIAGNOSIS — I700 Atherosclerosis of aorta: Secondary | ICD-10-CM | POA: Insufficient documentation

## 2022-01-10 NOTE — Telephone Encounter (Signed)
Refill request for TRAMADOL HCL 50 MG TABLET  LOV - 12/13/21 Next OV - not scheduled Last refill - 09/08/21 #60/3

## 2022-01-10 NOTE — Progress Notes (Unsigned)
Cardiology Office Note:    Date:  01/11/2022   ID:  Martin KerbsJohn P Fields, DOB May 25, 1926, MRN [id number]  PCP:  Joaquim Fields, Martin S, MD  [facility] Providers Cardiologist:  Lance MussJayadeep Varanasi, MD Cardiology APP:  Martin RoundsWeaver, Martin Nobbe T, Fields    Referring MD: Joaquim Fields, Martin S, MD   Chief Complaint:  F/u for CAD    Patient Profile: Hx of cryptogenic CVA in 6/16 Hx of splenic infarct  S/p ILR (at EOL; no longer monitored) Hypertension  Hyperlipidemia  COPD  von Willebrand's Disease Coronary artery disease  Coronary Ca2+ on prior CT  Myoview 2017: inf ischemia >> Med Rx GERD GI AVMs Barrett's esophagus Aortic atherosclerosis  Ectatic abd aorta (CT in 2015) Macular degeneration   Prior CV Studies: Chest CT 04/06/16 IMPRESSION: 1. The nodule questioned on left shoulder films represents a small calcified granuloma within the superior segment of the left lower lobe. There also are small calcified splenic granulomas consistent with prior granulomatous disease. 2. Moderate thoracic aortic atherosclerosis. 3. Calcification of the coronary arteries diffusely. 4. Paraseptal emphysema.   Myoview 03/25/16 Medium size, moderate intensity (SDS 5) reversible inferior perfusion defect which could suggest ischemia. Study was not gated due to frequent ventricular ectopy. This is an intermediate risk study.   TEE 10/28/15 EF 55%, normal wall motion, grade 4 plaque descending thoracic aorta, trivial MR, normal RVSF, no right-to-left atrial shunt   Event monitor 8/16 Sinus rhythm Rare pacs and pvcs No sustained arrhythmias No afib   Echo 6/16 Mild LVH, EF 85%, normal wall motion, mildly reduced RVSF, PASP 37 mmHg  History of Present Illness:   [name] is a 88 y.o. male with the above problem list.  He was last seen in March 2022.  He returns for f/u.  He is here with his wife.  He has been doing well without chest pain, shortness of breath, syncope, orthopnea, leg edema.          Past Medical  History:  Diagnosis Date   Back pain    improved with gabapentin as of 2015   Barrett esophagus 09/2003   Coronary artery calcification seen on CT scan    Diverticulitis    Diverticulosis    GERD (gastroesophageal reflux disease)    GI AVM (gastrointestinal arteriovenous vascular malformation)    Hearing loss    Hemorrhoids    History of Holter monitoring    a. holter 9/17: NSR, PVCs; no sustained arrhythmia; PVC burden 8%   History of nuclear stress test    a. Myoview 8/17: Intermediate risk, inferior ischemia, not gated   History of prostatitis    History of stroke    Hyperlipidemia    Hypertension    Macular degeneration, wet (HCC)    receiving intra-ocular injections ([facility])   PUD (peptic ulcer disease)    Current Medications: Current Meds  Medication Sig   acetaminophen (TYLENOL) 500 MG tablet Take 1,000 mg by mouth every 6 (six) hours as needed for headache (pain).   AFLIBERCEPT IO Inject into the eye See admin instructions. Right eye injection every 4 weeks   Alirocumab (PRALUENT) 75 MG/ML SOAJ Inject 75 mg into the skin every 14 (fourteen) days.   Bevacizumab (AVASTIN IV) Place into the left eye See admin instructions. Left eye injection every 8 weeks   cetirizine (ZYRTEC) 10 MG tablet Take 10 mg by mouth daily as needed for allergies.   clopidogrel (PLAVIX) 75 MG tablet TAKE 1 TABLET BY MOUTH EVERY DAY  diltiazem (CARDIZEM CD) 180 MG 24 hr capsule TAKE 1 CAPSULE BY MOUTH EVERY DAY   doxazosin (CARDURA) 1 MG tablet Take 1 tablet (1 mg total) by mouth 2 (two) times daily.   famotidine (PEPCID) 20 MG tablet TAKE 1 TABLET (20 MG TOTAL) BY MOUTH 2 (TWO) TIMES DAILY AS NEEDED FOR HEARTBURN OR INDIGESTION.   ferrous sulfate 325 (65 FE) MG tablet Take 1 tablet (325 mg total) by mouth daily with breakfast.   fluticasone (FLONASE) 50 MCG/ACT nasal spray Place 2 sprays into both nostrils daily as needed for allergies.   loperamide (IMODIUM) 2 MG capsule Take 1 capsule (2 mg  total) by mouth as needed for diarrhea or loose stools.   Multiple Vitamins-Minerals (OCUVITE ADULT 50+ PO) Take 1 capsule by mouth daily with lunch.   nitroGLYCERIN (NITROSTAT) 0.4 MG SL tablet Place 1 tablet (0.4 mg total) under the tongue every 5 (five) minutes as needed for chest pain (max 3 doses in 15 minutes).   pantoprazole (PROTONIX) 40 MG tablet TAKE 1 TABLET BY MOUTH EVERY DAY IN THE MORNING   Polyethyl Glycol-Propyl Glycol (SYSTANE) 0.4-0.3 % SOLN Place 1 drop into both eyes 3 (three) times daily as needed (dry eyes).   rOPINIRole (REQUIP) 0.5 MG tablet TAKE 1 TABLET BY MOUTH EVERYDAY AT BEDTIME   tamsulosin (FLOMAX) 0.4 MG CAPS capsule TAKE 1 CAPSULE BY MOUTH EVERY DAY   traMADol (ULTRAM) 50 MG tablet TAKE 1 TABLET BY MOUTH EVERY 8 HOURS AS NEEDED FOR PAIN   vitamin B-12 (CYANOCOBALAMIN) 1000 MCG tablet Take 2 tablets (2,000 mcg total) by mouth daily.    Allergies:   Angiotensin receptor blockers, Atorvastatin, Codeine, Lisinopril, Statins, and Gabapentin   Social History   Tobacco Use   Smoking status: Former    Packs/day: 0.00    Years: 36.00    Pack years: 0.00    Types: Cigarettes   Smokeless tobacco: Never   Tobacco comments:    Quit in 1983  Vaping Use   Vaping Use: Never used  Substance Use Topics   Alcohol use: No    Alcohol/week: 0.0 standard drinks   Drug use: No    Family Hx: The patient's family history includes Arthritis in his sister; Bleeding Disorder in his son; Cancer in his brother and mother; Coronary artery disease in his father; Heart attack in his father; Hyperlipidemia in his sister; Hypertension in his mother; Pancreatic cancer in his mother; Prostate cancer in his brother. There is no history of Diabetes.  Review of Systems  Gastrointestinal:  Negative for hematochezia.  Genitourinary:  Negative for hematuria.    EKGs/Labs/Other Test Reviewed:    EKG:  EKG is   ordered today.  The ekg ordered today demonstrates NSR, HR 68, normal axis,  non-specific ST-TW changes, inc Right Bundle Branch Block, QTc 438 ms   Recent Labs: 12/14/2021: ALT 11; BUN 14; Creatinine, Ser 0.92; Hemoglobin 14.5; Platelets 244.0; Potassium 4.1; Sodium 137; TSH 2.87   Recent Lipid Panel Recent Labs    05/25/21 0925  CHOL 165  TRIG 77.0  HDL 64.70  VLDL 15.4  LDLCALC 84     Risk Assessment/Calculations:         Physical Exam:    VS:  BP 140/62   Pulse 68   Ht 5\' 6"  (1.676 m)   Wt 158 lb 3.2 oz (71.8 kg)   SpO2 96%   BMI 25.53 kg/m     Wt Readings from Last 3 Encounters:  01/11/22 158 lb  3.2 oz (71.8 kg)  12/14/21 159 lb (72.1 kg)  10/20/21 160 lb (72.6 kg)    Constitutional:      Appearance: Healthy appearance. Not in distress.  Neck:     Vascular: No carotid bruit or JVR. JVD normal.  Pulmonary:     Effort: Pulmonary effort is normal.     Breath sounds: No wheezing. No rales.  Cardiovascular:     Normal rate. Regular rhythm. Normal S1. Normal S2.      Murmurs: There is no murmur.  Edema:    Peripheral edema absent.  Abdominal:     Palpations: Abdomen is soft.  Skin:    General: Skin is warm and dry.  Neurological:     General: No focal deficit present.     Mental Status: Alert and oriented to person, place and time.     Cranial Nerves: Cranial nerves are intact.         ASSESSMENT & PLAN:   Aortic atherosclerosis (HCC) Continue Plavix, PCSK9 inhib.  Coronary artery disease involving native coronary artery of native heart without angina pectoris Coronary calcification noted on prior CT.  Myoview in 2017 demonstrated inferior ischemia.  He has been managed medically given his advanced age.  He is doing well without anginal symptoms.  Continue Plavix 75 mg daily, Cardizem CD1 80 mg daily, Alirocumab 75 mg every 2 weeks.  Follow-up in 1 year.  Ectatic abdominal aorta (HCC) Noted on prior CT in 2015.  He has not had an ultrasound to rule out aneurysm since.  He is agreeable to pursue AAA ultrasound. Arrange AAA  ultrasound  Essential hypertension Fair control.  Continue Cardizem CD 180 mg daily, Cardura 1 mg twice daily.  Hyperlipidemia LDL goal <70 LDL has been close to optimal on Alirocumab in the past.  Primary care had him hold recently to see if it would help some diffuse myalgias.  He has not really noticed any significant change and has decided to go ahead and resume it.            Dispo:  Return in about 1 year (around 01/12/2023) for Routine Follow Up, w/ Tereso Newcomer, Fields.   Medication Adjustments/Labs and Tests Ordered: Current medicines are reviewed at length with the patient today.  Concerns regarding medicines are outlined above.  Tests Ordered: Orders Placed This Encounter  Procedures   EKG 12-Lead   VAS [place] AAA DUPLEX   Medication Changes: No orders of the defined types were placed in this encounter.  Signed, Tereso Newcomer, Fields  01/11/2022 10:41 AM    [facility] [address] [place], [place], [place]  [address] Phone: [phone]; Fax: [phone]

## 2022-01-11 ENCOUNTER — Ambulatory Visit: Payer: Medicare HMO | Admitting: Physician Assistant

## 2022-01-11 ENCOUNTER — Encounter: Payer: Self-pay | Admitting: Physician Assistant

## 2022-01-11 VITALS — BP 140/62 | HR 68 | Ht 66.0 in | Wt 158.2 lb

## 2022-01-11 DIAGNOSIS — E785 Hyperlipidemia, unspecified: Secondary | ICD-10-CM

## 2022-01-11 DIAGNOSIS — I2510 Atherosclerotic heart disease of native coronary artery without angina pectoris: Secondary | ICD-10-CM

## 2022-01-11 DIAGNOSIS — I10 Essential (primary) hypertension: Secondary | ICD-10-CM

## 2022-01-11 DIAGNOSIS — I77811 Abdominal aortic ectasia: Secondary | ICD-10-CM

## 2022-01-11 DIAGNOSIS — I700 Atherosclerosis of aorta: Secondary | ICD-10-CM

## 2022-01-11 NOTE — Assessment & Plan Note (Signed)
LDL has been close to optimal on Alirocumab in the past.  Primary care had him hold recently to see if it would help some diffuse myalgias.  He has not really noticed any significant change and has decided to go ahead and resume it.

## 2022-01-11 NOTE — Assessment & Plan Note (Signed)
Coronary calcification noted on prior CT.  Myoview in 2017 demonstrated inferior ischemia.  He has been managed medically given his advanced age.  He is doing well without anginal symptoms.  Continue Plavix 75 mg daily, Cardizem CD1 80 mg daily, Alirocumab 75 mg every 2 weeks.  Follow-up in 1 year.

## 2022-01-11 NOTE — Assessment & Plan Note (Signed)
Continue Plavix, PCSK9 inhib.

## 2022-01-11 NOTE — Assessment & Plan Note (Signed)
Noted on prior CT in 2015.  He has not had an ultrasound to rule out aneurysm since.  He is agreeable to pursue AAA ultrasound.  Arrange AAA ultrasound

## 2022-01-11 NOTE — Patient Instructions (Addendum)
Medication Instructions:  Your physician recommends that you continue on your current medications as directed. Please refer to the Current Medication list given to you today.  It is ok to resume the Praluent  *If you need a refill on your cardiac medications before your next appointment, please call your pharmacy*   Lab Work: None ordered  If you have labs (blood work) drawn today and your tests are completely normal, you will receive your results only by: MyChart Message (if you have MyChart) OR A paper copy in the mail If you have any lab test that is abnormal or we need to change your treatment, we will call you to review the results.   Testing/Procedures: Your physician has requested that you have an abdominal aorta duplex. During this test, an ultrasound is used to evaluate the aorta. Allow 30 minutes for this exam. Do not eat after midnight the day before and avoid carbonated beverages    Follow-Up: At [facility], you and your health needs are our priority.  As part of our continuing mission to provide you with exceptional heart care, we have created designated Provider Care Teams.  These Care Teams include your primary Cardiologist (physician) and Advanced Practice Providers (APPs -  Physician Assistants and Nurse Practitioners) who all work together to provide you with the care you need, when you need it.  We recommend signing up for the patient portal called "MyChart".  Sign up information is provided on this After Visit Summary.  MyChart is used to connect with patients for Virtual Visits (Telemedicine).  Patients are able to view lab/test results, encounter notes, upcoming appointments, etc.  Non-urgent messages can be sent to your provider as well.   To learn more about what you can do with MyChart, go to [email or web address].    Your next appointment:   12 month(s)  The format for your next appointment:   In Person  Provider:   Tereso Newcomer, PA-C         Other  Instructions   Important Information About Sugar

## 2022-01-11 NOTE — Assessment & Plan Note (Signed)
Fair control.  Continue Cardizem CD 180 mg daily, Cardura 1 mg twice daily.

## 2022-01-26 ENCOUNTER — Ambulatory Visit (HOSPITAL_COMMUNITY)
Admission: RE | Admit: 2022-01-26 | Discharge: 2022-01-26 | Disposition: A | Discharge status: Home / Self Care | Payer: Medicare HMO | Source: Ambulatory Visit | Attending: Physician Assistant | Admitting: Physician Assistant

## 2022-01-26 DIAGNOSIS — I77811 Abdominal aortic ectasia: Secondary | ICD-10-CM | POA: Insufficient documentation

## 2022-01-27 DIAGNOSIS — H02132 Senile ectropion of right lower eyelid: Secondary | ICD-10-CM | POA: Diagnosis not present

## 2022-01-27 DIAGNOSIS — H353211 Exudative age-related macular degeneration, right eye, with active choroidal neovascularization: Secondary | ICD-10-CM | POA: Diagnosis not present

## 2022-01-27 DIAGNOSIS — H353231 Exudative age-related macular degeneration, bilateral, with active choroidal neovascularization: Secondary | ICD-10-CM | POA: Diagnosis not present

## 2022-01-27 DIAGNOSIS — H35423 Microcystoid degeneration of retina, bilateral: Secondary | ICD-10-CM | POA: Diagnosis not present

## 2022-01-27 DIAGNOSIS — H43813 Vitreous degeneration, bilateral: Secondary | ICD-10-CM | POA: Diagnosis not present

## 2022-01-31 ENCOUNTER — Ambulatory Visit (INDEPENDENT_AMBULATORY_CARE_PROVIDER_SITE_OTHER): Payer: Medicare HMO

## 2022-01-31 DIAGNOSIS — Z0000 Encounter for general adult medical examination without abnormal findings: Secondary | ICD-10-CM

## 2022-01-31 NOTE — Progress Notes (Signed)
Virtual Visit via Telephone Note  I connected with  [name] on 01/31/22 at  2:15 PM EDT by telephone and verified that I am speaking with the correct person using two identifiers.  Location: Patient: home Provider: [facility] Persons participating in the virtual visit: patient/Nurse Health Advisor   I discussed the limitations, risks, security and privacy concerns of performing an evaluation and management service by telephone and the availability of in person appointments. The patient expressed understanding and agreed to proceed.  Interactive audio and video telecommunications were attempted between this nurse and patient, however failed, due to patient having technical difficulties OR patient did not have access to video capability.  We continued and completed visit with audio only.  Some vital signs may be absent or patient reported.   Hal Hope, LPN  Subjective:   [name] is a 88 y.o. male who presents for Medicare Annual/Subsequent preventive examination.  Review of Systems           Objective:    There were no vitals filed for this visit. There is no height or weight on file to calculate BMI.     01/28/2021    2:02 PM 09/21/2020    9:30 PM 09/21/2020    9:22 PM 11/29/2019    3:07 PM 05/15/2018   12:20 AM 10/10/2017    2:16 PM 11/29/2016    3:26 PM  Advanced Directives  Does Patient Have a Medical Advance Directive? Yes Yes Yes Yes No Yes No  Type of Estate agent of [place];Living will Healthcare Power of [place];Living will Living will;Healthcare Power of [employer] Power of [place];Living will  Healthcare Power of [place];Living will   Does patient want to make changes to medical advance directive?  No - Patient declined       Copy of Healthcare Power of Attorney in Chart? No - copy requested No - copy requested No - copy requested No - copy requested  No - copy requested   Would patient like information on creating  a medical advance directive?     No - Patient declined      Current Medications (verified) Outpatient Encounter Medications as of 01/31/2022  Medication Sig   acetaminophen (TYLENOL) 500 MG tablet Take 1,000 mg by mouth every 6 (six) hours as needed for headache (pain).   AFLIBERCEPT IO Inject into the eye See admin instructions. Right eye injection every 4 weeks   Alirocumab (PRALUENT) 75 MG/ML SOAJ Inject 75 mg into the skin every 14 (fourteen) days.   Bevacizumab (AVASTIN IV) Place into the left eye See admin instructions. Left eye injection every 8 weeks   cetirizine (ZYRTEC) 10 MG tablet Take 10 mg by mouth daily as needed for allergies.   clopidogrel (PLAVIX) 75 MG tablet TAKE 1 TABLET BY MOUTH EVERY DAY   diltiazem (CARDIZEM CD) 180 MG 24 hr capsule TAKE 1 CAPSULE BY MOUTH EVERY DAY   doxazosin (CARDURA) 1 MG tablet Take 1 tablet (1 mg total) by mouth 2 (two) times daily.   famotidine (PEPCID) 20 MG tablet TAKE 1 TABLET (20 MG TOTAL) BY MOUTH 2 (TWO) TIMES DAILY AS NEEDED FOR HEARTBURN OR INDIGESTION.   ferrous sulfate 325 (65 FE) MG tablet Take 1 tablet (325 mg total) by mouth daily with breakfast.   fluticasone (FLONASE) 50 MCG/ACT nasal spray Place 2 sprays into both nostrils daily as needed for allergies.   loperamide (IMODIUM) 2 MG capsule Take 1 capsule (2 mg total)  by mouth as needed for diarrhea or loose stools.   Multiple Vitamins-Minerals (OCUVITE ADULT 50+ PO) Take 1 capsule by mouth daily with lunch.   nitroGLYCERIN (NITROSTAT) 0.4 MG SL tablet Place 1 tablet (0.4 mg total) under the tongue every 5 (five) minutes as needed for chest pain (max 3 doses in 15 minutes).   pantoprazole (PROTONIX) 40 MG tablet TAKE 1 TABLET BY MOUTH EVERY DAY IN THE MORNING   Polyethyl Glycol-Propyl Glycol (SYSTANE) 0.4-0.3 % SOLN Place 1 drop into both eyes 3 (three) times daily as needed (dry eyes).   rOPINIRole (REQUIP) 0.5 MG tablet TAKE 1 TABLET BY MOUTH EVERYDAY AT BEDTIME   tamsulosin  (FLOMAX) 0.4 MG CAPS capsule TAKE 1 CAPSULE BY MOUTH EVERY DAY   traMADol (ULTRAM) 50 MG tablet TAKE 1 TABLET BY MOUTH EVERY 8 HOURS AS NEEDED FOR PAIN   vitamin B-12 (CYANOCOBALAMIN) 1000 MCG tablet Take 2 tablets (2,000 mcg total) by mouth daily.   No facility-administered encounter medications on file as of 01/31/2022.    Allergies (verified) Angiotensin receptor blockers, Atorvastatin, Codeine, Lisinopril, Statins, and Gabapentin   History: Past Medical History:  Diagnosis Date   Back pain    improved with gabapentin as of 2015   Barrett esophagus 09/2003   Coronary artery calcification seen on CT scan    Diverticulitis    Diverticulosis    GERD (gastroesophageal reflux disease)    GI AVM (gastrointestinal arteriovenous vascular malformation)    Hearing loss    Hemorrhoids    History of Holter monitoring    a. holter 9/17: NSR, PVCs; no sustained arrhythmia; PVC burden 8%   History of nuclear stress test    a. Myoview 8/17: Intermediate risk, inferior ischemia, not gated   History of prostatitis    History of stroke    Hyperlipidemia    Hypertension    Macular degeneration, wet (HCC)    receiving intra-ocular injections ([place])   PUD (peptic ulcer disease)    Past Surgical History:  Procedure Laterality Date   EP IMPLANTABLE DEVICE N/A 12/24/2015   Procedure: Loop Recorder Insertion;  Surgeon: Hillis Range, MD;  Location: [facility] INVASIVE CV LAB;  Service: Cardiovascular;  Laterality: N/A;   INGUINAL HERNIA REPAIR  2001   LAPAROSCOPIC APPENDECTOMY N/A 07/07/2014   Procedure: APPENDECTOMY LAPAROSCOPIC;  Surgeon: Manus Rudd, MD;  Location: [facility] OR;  Service: General;  Laterality: N/A;   TEE WITHOUT CARDIOVERSION N/A 10/28/2015   Procedure: TRANSESOPHAGEAL ECHOCARDIOGRAM (TEE);  Surgeon: Laurey Morale, MD;  Location: [facility] ENDOSCOPY;  Service: Cardiovascular;  Laterality: N/A;   Family History  Problem Relation Age of Onset   Heart attack Father    Coronary artery disease  Father    Hypertension Mother    Pancreatic cancer Mother        Died at 58.   Cancer Mother        Pancreatic cancer   Arthritis Sister    Hyperlipidemia Sister    Prostate cancer Brother    Cancer Brother        Bone Cancer secondary to Prostate Cancer   Bleeding Disorder Son    Diabetes Neg Hx    Social History   Socioeconomic History   Marital status: Married    Spouse name: Shavar Gorka   Number of children: Not on file   Years of education: Not on file   Highest education level: Not on file  Occupational History   Occupation: Retired    Associate Professor: RETIRED  Tobacco Use  Smoking status: Former    Packs/day: 0.00    Years: 36.00    Total pack years: 0.00    Types: Cigarettes   Smokeless tobacco: Never   Tobacco comments:    Quit in Dec 31, 1981  Vaping Use   Vaping Use: Never used  Substance and Sexual Activity   Alcohol use: No    Alcohol/week: 0.0 standard drinks of alcohol   Drug use: No   Sexual activity: Not on file  Other Topics Concern   Not on file  Social History Narrative   6th grade. Married Jan 01, 1951 1 son, 1 dtr - died at 5 months. Work - Management consultant, Engineer, drilling, Catering manager.   Retired-fulltime. "[employer]". ACP - yes DNR, yes short-term mechanical ventilarion, no heroic or futile measures in face of irreversible.   Social Determinants of Health   Financial Resource Strain: Low Risk  (01/28/2021)   Overall Financial Resource Strain (CARDIA)    Difficulty of Paying Living Expenses: Not hard at all  Food Insecurity: No Food Insecurity (01/28/2021)   Hunger Vital Sign    Worried About Running Out of Food in the Last Year: Never true    Ran Out of Food in the Last Year: Never true  Transportation Needs: No Transportation Needs (01/28/2021)   PRAPARE - Administrator, Civil Service (Medical): No    Lack of Transportation (Non-Medical): No  Physical Activity: Sufficiently Active (01/31/2022)   Exercise Vital Sign    Days of Exercise per Week: 3 days     Minutes of Exercise per Session: 60 min  Stress: No Stress Concern Present (01/31/2022)   [employer] of Occupational Health - Occupational Stress Questionnaire    Feeling of Stress : Not at all  Social Connections: Not on file    Tobacco Counseling Counseling given: Not Answered Tobacco comments: Quit in 1981/12/31   Clinical Intake:  Pre-visit preparation completed: Yes  Pain : No/denies pain     Nutritional Risks: None Diabetes: No  How often do you need to have someone help you when you read instructions, pamphlets, or other written materials from your doctor or pharmacy?: 1 - Never  Diabetic?no     Information entered by :: Kennedy Bucker, LPN   Activities of Daily Living     No data to display          Patient Care Team: Joaquim Nam, MD as PCP - General (Family Medicine) Corky Crafts, MD as PCP - Cardiology (Cardiology) Kennon Rounds as Physician Assistant (Cardiology) Kathyrn Sheriff, [facility] as Pharmacist (Pharmacist)  Indicate any recent Medical Services you may have received from other than [facility] providers in the past year (date may be approximate).     Assessment:   This is a routine wellness examination for Manual.  Hearing/Vision screen No results found.  Dietary issues and exercise activities discussed:     Goals Addressed             This Visit's Progress    DIET - EAT MORE FRUITS AND VEGETABLES         Depression Screen    01/31/2022    2:23 PM 01/28/2021    2:04 PM 01/21/2021   10:28 AM 11/29/2019    3:09 PM 10/29/2018    9:52 AM 10/10/2017    2:15 PM 02/01/2017    5:49 PM  PHQ 2/9 Scores  PHQ - 2 Score 0 0 0 0 0 0 1  PHQ- 9 Score 0 0  0 0  0     Fall Risk    01/28/2021    2:03 PM 11/29/2019    3:08 PM 10/29/2018    9:52 AM 10/10/2017    2:15 PM 11/07/2016    9:44 AM  Fall Risk   Falls in the past year? 0 1 1 No No  Comment  tripped and fell     Number falls in past yr: 0 0 0    Injury with Fall? 0 0      Risk for fall due to : Medication side effect Medication side effect     Follow up Falls evaluation completed;Falls prevention discussed Falls evaluation completed;Falls prevention discussed       FALL RISK PREVENTION PERTAINING TO THE HOME:  Any stairs in or around the home? No  If so, are there any without handrails? No  Home free of loose throw rugs in walkways, pet beds, electrical cords, etc? Yes  Adequate lighting in your home to reduce risk of falls? Yes   ASSISTIVE DEVICES UTILIZED TO PREVENT FALLS:  Life alert? No  Use of a cane, walker or w/c? No  Grab bars in the bathroom? No  Shower chair or bench in shower? No  Elevated toilet seat or a handicapped toilet? No    Cognitive Function: declined to test 2023       01/28/2021    2:06 PM 11/29/2019    3:13 PM 10/10/2017    2:04 PM  MMSE - Mini Mental State Exam  Not completed: Refused    Orientation to time  5 5  Orientation to Place  5 5  Registration  3 3  Attention/ Calculation  5 0  Recall  3 3  Language- name 2 objects   0  Language- repeat  1 1  Language- follow 3 step command   2  Language- follow 3 step command-comments   unable to complete 1 step of 3 step command  Language- read & follow direction   0  Write a sentence   0  Copy design   0  Total score   19        Immunizations Immunization History  Administered Date(s) Administered   Fluad Quad(high Dose 65+) 06/03/2020   Influenza Split 06/07/2011, 06/07/2012   Influenza Whole 07/31/2007, 06/04/2008, 06/03/2009, 05/20/2010   Influenza, High Dose Seasonal PF 05/14/2019   Influenza,inj,Quad PF,6+ Mos 06/26/2013, 06/11/2014, 05/26/2015, 04/26/2016, 05/22/2017, 06/01/2018   Influenza-Unspecified 07/16/2021   PFIZER(Purple Top)SARS-COV-2 Vaccination 11/16/2019, 12/10/2019, 06/24/2020   Pfizer Covid-19 Vaccine Bivalent Booster 24yrs & up 06/10/2021   Pneumococcal Conjugate-13 08/12/2013   Pneumococcal Polysaccharide-23 07/29/2010   Td 07/29/2010    Tdap 12/22/2017    TDAP status: Up to date  Flu Vaccine status: Declined, Education has been provided regarding the importance of this vaccine but patient still declined. Advised may receive this vaccine at local pharmacy or Health Dept. Aware to provide a copy of the vaccination record if obtained from local pharmacy or Health Dept. Verbalized acceptance and understanding.  Pneumococcal vaccine status: Up to date  Covid-19 vaccine status: Completed vaccines  Qualifies for Shingles Vaccine? Yes   Zostavax completed No   Shingrix Completed?: No.    Education has been provided regarding the importance of this vaccine. Patient has been advised to call insurance company to determine out of pocket expense if they have not yet received this vaccine. Advised may also receive vaccine at local pharmacy or Health Dept. Verbalized acceptance and understanding.  Screening  Tests Health Maintenance  Topic Date Due   Zoster Vaccines- Shingrix (1 of 2) Never done   COVID-19 Vaccine (5 - Pfizer series) 10/11/2021   INFLUENZA VACCINE  03/22/2022   TETANUS/TDAP  12/23/2027   Pneumonia Vaccine 4065+ Years old  Completed   HPV VACCINES  Aged Out    Health Maintenance  Health Maintenance Due  Topic Date Due   Zoster Vaccines- Shingrix (1 of 2) Never done   COVID-19 Vaccine (5 - Pfizer series) 10/11/2021    Colorectal cancer screening: No longer required.   Lung Cancer Screening: (Low Dose CT Chest recommended if Age 80-80 years, 30 pack-year currently smoking OR have quit w/in 15years.) does not qualify.   Additional Screening:  Hepatitis C Screening: does not qualify; Completed no  Vision Screening: Recommended annual ophthalmology exams for early detection of glaucoma and other disorders of the eye. Is the patient up to date with their annual eye exam?  Yes  Who is the provider or what is the name of the office in which the patient attends annual eye exams? Dr.McEwan If pt is not  established with a provider, would they like to be referred to a provider to establish care? No .   Dental Screening: Recommended annual dental exams for proper oral hygiene  Community Resource Referral / Chronic Care Management: CRR required this visit?  No   CCM required this visit?  No      Plan:     I have personally reviewed and noted the following in the patient's chart:   Medical and social history Use of alcohol, tobacco or illicit drugs  Current medications and supplements including opioid prescriptions. Patient is not currently taking opioid prescriptions. Functional ability and status Nutritional status Physical activity Advanced directives List of other physicians Hospitalizations, surgeries, and ER visits in previous 12 months Vitals Screenings to include cognitive, depression, and falls Referrals and appointments  In addition, I have reviewed and discussed with patient certain preventive protocols, quality metrics, and best practice recommendations. A written personalized care plan for preventive services as well as general preventive health recommendations were provided to patient.     Hal HopeLorrie S Saxon Crosby, LPN   1/61/09606/07/2022   Nurse Notes: none

## 2022-01-31 NOTE — Patient Instructions (Signed)
[name] , Thank you for taking time to come for your Medicare Wellness Visit. I appreciate your ongoing commitment to your health goals. Please review the following plan we discussed and let me know if I can assist you in the future.   Screening recommendations/referrals: Colonoscopy: aged out Recommended yearly ophthalmology/optometry visit for glaucoma screening and checkup Recommended yearly dental visit for hygiene and checkup  Vaccinations: Influenza vaccine: n/d Pneumococcal vaccine: 08/12/13 Tdap vaccine: 12/22/17 Shingles vaccine: n/d   Covid-19: 11/16/19, 12/10/19, 06/24/20, 06/10/21  Advanced directives: no  Conditions/risks identified: none  Next appointment: Follow up in one year for your annual wellness visit. declined  Preventive Care 86 Years and Older, Male Preventive care refers to lifestyle choices and visits with your health care provider that can promote health and wellness. What does preventive care include? A yearly physical exam. This is also called an annual well check. Dental exams once or twice a year. Routine eye exams. Ask your health care provider how often you should have your eyes checked. Personal lifestyle choices, including: Daily care of your teeth and gums. Regular physical activity. Eating a healthy diet. Avoiding tobacco and drug use. Limiting alcohol use. Practicing safe sex. Taking low doses of aspirin every day. Taking vitamin and mineral supplements as recommended by your health care provider. What happens during an annual well check? The services and screenings done by your health care provider during your annual well check will depend on your age, overall health, lifestyle risk factors, and family history of disease. Counseling  Your health care provider may ask you questions about your: Alcohol use. Tobacco use. Drug use. Emotional well-being. Home and relationship well-being. Sexual activity. Eating habits. History of  falls. Memory and ability to understand (cognition). Work and work Astronomer. Screening  You may have the following tests or measurements: Height, weight, and BMI. Blood pressure. Lipid and cholesterol levels. These may be checked every 5 years, or more frequently if you are over 86 years old. Skin check. Lung cancer screening. You may have this screening every year starting at age 86 if you have a 30-pack-year history of smoking and currently smoke or have quit within the past 15 years. Fecal occult blood test (FOBT) of the stool. You may have this test every year starting at age 86. Flexible sigmoidoscopy or colonoscopy. You may have a sigmoidoscopy every 5 years or a colonoscopy every 10 years starting at age 20. Prostate cancer screening. Recommendations will vary depending on your family history and other risks. Hepatitis C blood test. Hepatitis B blood test. Sexually transmitted disease (STD) testing. Diabetes screening. This is done by checking your blood sugar (glucose) after you have not eaten for a while (fasting). You may have this done every 1-3 years. Abdominal aortic aneurysm (AAA) screening. You may need this if you are a current or former smoker. Osteoporosis. You may be screened starting at age 28 if you are at high risk. Talk with your health care provider about your test results, treatment options, and if necessary, the need for more tests. Vaccines  Your health care provider may recommend certain vaccines, such as: Influenza vaccine. This is recommended every year. Tetanus, diphtheria, and acellular pertussis (Tdap, Td) vaccine. You may need a Td booster every 10 years. Zoster vaccine. You may need this after age 43. Pneumococcal 13-valent conjugate (PCV13) vaccine. One dose is recommended after age 58. Pneumococcal polysaccharide (PPSV23) vaccine. One dose is recommended after age 26. Talk to your health care provider about which  screenings and vaccines you need and  how often you need them. This information is not intended to replace advice given to you by your health care provider. Make sure you discuss any questions you have with your health care provider. Document Released: 09/04/2015 Document Revised: 04/27/2016 Document Reviewed: 06/09/2015 Elsevier Interactive Patient Education  2017 [place] Prevention in the Home Falls can cause injuries. They can happen to people of all ages. There are many things you can do to make your home safe and to help prevent falls. What can I do on the outside of my home? Regularly fix the edges of walkways and driveways and fix any cracks. Remove anything that might make you trip as you walk through a door, such as a raised step or threshold. Trim any bushes or trees on the path to your home. Use bright outdoor lighting. Clear any walking paths of anything that might make someone trip, such as rocks or tools. Regularly check to see if handrails are loose or broken. Make sure that both sides of any steps have handrails. Any raised decks and porches should have guardrails on the edges. Have any leaves, snow, or ice cleared regularly. Use sand or salt on walking paths during winter. Clean up any spills in your garage right away. This includes oil or grease spills. What can I do in the bathroom? Use night lights. Install grab bars by the toilet and in the tub and shower. Do not use towel bars as grab bars. Use non-skid mats or decals in the tub or shower. If you need to sit down in the shower, use a plastic, non-slip stool. Keep the floor dry. Clean up any water that spills on the floor as soon as it happens. Remove soap buildup in the tub or shower regularly. Attach bath mats securely with double-sided non-slip rug tape. Do not have throw rugs and other things on the floor that can make you trip. What can I do in the bedroom? Use night lights. Make sure that you have a light by your bed that is easy to  reach. Do not use any sheets or blankets that are too big for your bed. They should not hang down onto the floor. Have a firm chair that has side arms. You can use this for support while you get dressed. Do not have throw rugs and other things on the floor that can make you trip. What can I do in the kitchen? Clean up any spills right away. Avoid walking on wet floors. Keep items that you use a lot in easy-to-reach places. If you need to reach something above you, use a strong step stool that has a grab bar. Keep electrical cords out of the way. Do not use floor polish or wax that makes floors slippery. If you must use wax, use non-skid floor wax. Do not have throw rugs and other things on the floor that can make you trip. What can I do with my stairs? Do not leave any items on the stairs. Make sure that there are handrails on both sides of the stairs and use them. Fix handrails that are broken or loose. Make sure that handrails are as long as the stairways. Check any carpeting to make sure that it is firmly attached to the stairs. Fix any carpet that is loose or worn. Avoid having throw rugs at the top or bottom of the stairs. If you do have throw rugs, attach them to the floor with carpet  tape. Make sure that you have a light switch at the top of the stairs and the bottom of the stairs. If you do not have them, ask someone to add them for you. What else can I do to help prevent falls? Wear shoes that: Do not have high heels. Have rubber bottoms. Are comfortable and fit you well. Are closed at the toe. Do not wear sandals. If you use a stepladder: Make sure that it is fully opened. Do not climb a closed stepladder. Make sure that both sides of the stepladder are locked into place. Ask someone to hold it for you, if possible. Clearly mark and make sure that you can see: Any grab bars or handrails. First and last steps. Where the edge of each step is. Use tools that help you move  around (mobility aids) if they are needed. These include: Canes. Walkers. Scooters. Crutches. Turn on the lights when you go into a dark area. Replace any light bulbs as soon as they burn out. Set up your furniture so you have a clear path. Avoid moving your furniture around. If any of your floors are uneven, fix them. If there are any pets around you, be aware of where they are. Review your medicines with your doctor. Some medicines can make you feel dizzy. This can increase your chance of falling. Ask your doctor what other things that you can do to help prevent falls. This information is not intended to replace advice given to you by your health care provider. Make sure you discuss any questions you have with your health care provider. Document Released: 06/04/2009 Document Revised: 01/14/2016 Document Reviewed: 09/12/2014 Elsevier Interactive Patient Education  2017 [employer].

## 2022-02-01 DIAGNOSIS — H02132 Senile ectropion of right lower eyelid: Secondary | ICD-10-CM | POA: Diagnosis not present

## 2022-02-01 DIAGNOSIS — H02109 Unspecified ectropion of unspecified eye, unspecified eyelid: Secondary | ICD-10-CM | POA: Diagnosis not present

## 2022-02-01 DIAGNOSIS — H02135 Senile ectropion of left lower eyelid: Secondary | ICD-10-CM | POA: Diagnosis not present

## 2022-02-08 ENCOUNTER — Other Ambulatory Visit: Payer: Self-pay | Admitting: Family Medicine

## 2022-03-04 ENCOUNTER — Other Ambulatory Visit: Payer: Self-pay | Admitting: Physician Assistant

## 2022-03-09 ENCOUNTER — Other Ambulatory Visit: Payer: Self-pay | Admitting: Family Medicine

## 2022-03-09 DIAGNOSIS — R351 Nocturia: Secondary | ICD-10-CM | POA: Diagnosis not present

## 2022-03-09 DIAGNOSIS — G2581 Restless legs syndrome: Secondary | ICD-10-CM

## 2022-03-09 DIAGNOSIS — N411 Chronic prostatitis: Secondary | ICD-10-CM | POA: Diagnosis not present

## 2022-03-12 ENCOUNTER — Other Ambulatory Visit: Payer: Self-pay | Admitting: Family Medicine

## 2022-03-12 DIAGNOSIS — G2581 Restless legs syndrome: Secondary | ICD-10-CM

## 2022-03-14 DIAGNOSIS — H353231 Exudative age-related macular degeneration, bilateral, with active choroidal neovascularization: Secondary | ICD-10-CM | POA: Diagnosis not present

## 2022-03-15 ENCOUNTER — Telehealth: Payer: Self-pay | Admitting: *Deleted

## 2022-03-15 DIAGNOSIS — H02135 Senile ectropion of left lower eyelid: Secondary | ICD-10-CM | POA: Diagnosis not present

## 2022-03-15 DIAGNOSIS — H02132 Senile ectropion of right lower eyelid: Secondary | ICD-10-CM | POA: Diagnosis not present

## 2022-03-15 DIAGNOSIS — Z01818 Encounter for other preprocedural examination: Secondary | ICD-10-CM | POA: Diagnosis not present

## 2022-03-15 DIAGNOSIS — H02109 Unspecified ectropion of unspecified eye, unspecified eyelid: Secondary | ICD-10-CM | POA: Diagnosis not present

## 2022-03-15 NOTE — Telephone Encounter (Signed)
   Pre-operative Risk Assessment    Patient Name: [name]  DOB: November 10, 1925 MRN: [id number]      Request for Surgical Clearance    Procedure:   ECTROPION REPAIR/CONJUNCTIVOPLASTY  Date of Surgery:  Clearance TBD                                 Surgeon:  DR. Dairl Ponder Surgeon's Group or Practice Name:  [facility] Phone number:  [phone] [phone] Fax number:  [phone]   Type of Clearance Requested:   - Medical  - Pharmacy:  Hold Clopidogrel (Plavix) x 5 DAYS BEFORE SURGERY   Type of Anesthesia:   IV SEDATION   Additional requests/questions:    Elpidio Anis   03/15/2022, 6:29 PM

## 2022-03-16 NOTE — Telephone Encounter (Signed)
Patient is on Plavix due to history of CVA and a splenic infarct.  Plavix recommended by neurologist Dr. Pearlean Brownie and has been prescribed by Dr. Para March patient's PCP.  We will defer holding time of Plavix to the prescribing physician.  Please arrange a virtual telephone visit with preop APP

## 2022-03-17 ENCOUNTER — Telehealth: Payer: Self-pay | Admitting: *Deleted

## 2022-03-17 NOTE — Telephone Encounter (Signed)
DPR ok to s/w the pt's wife. Pt's wife agreeable to plan of care for tele pre op appt 03/24/22 @ 2:20 for the pt. Med rec and consent are done.     Patient Consent for Virtual Visit        [name] has provided verbal consent on 03/17/2022 for a virtual visit (video or telephone).   CONSENT FOR VIRTUAL VISIT FOR:  [name]  By participating in this virtual visit I agree to the following:  I hereby voluntarily request, consent and authorize [facility] and its employed or contracted physicians, physician assistants, nurse practitioners or other licensed health care professionals (the Practitioner), to provide me with telemedicine health care services (the "Services") as deemed necessary by the treating Practitioner. I acknowledge and consent to receive the Services by the Practitioner via telemedicine. I understand that the telemedicine visit will involve communicating with the Practitioner through live audiovisual communication technology and the disclosure of certain medical information by electronic transmission. I acknowledge that I have been given the opportunity to request an in-person assessment or other available alternative prior to the telemedicine visit and am voluntarily participating in the telemedicine visit.  I understand that I have the right to withhold or withdraw my consent to the use of telemedicine in the course of my care at any time, without affecting my right to future care or treatment, and that the Practitioner or I may terminate the telemedicine visit at any time. I understand that I have the right to inspect all information obtained and/or recorded in the course of the telemedicine visit and may receive copies of available information for a reasonable fee.  I understand that some of the potential risks of receiving the Services via telemedicine include:  Delay or interruption in medical evaluation due to technological equipment failure or disruption; Information  transmitted may not be sufficient (e.g. poor resolution of images) to allow for appropriate medical decision making by the Practitioner; and/or  In rare instances, security protocols could fail, causing a breach of personal health information.  Furthermore, I acknowledge that it is my responsibility to provide information about my medical history, conditions and care that is complete and accurate to the best of my ability. I acknowledge that Practitioner's advice, recommendations, and/or decision may be based on factors not within their control, such as incomplete or inaccurate data provided by me or distortions of diagnostic images or specimens that may result from electronic transmissions. I understand that the practice of medicine is not an exact science and that Practitioner makes no warranties or guarantees regarding treatment outcomes. I acknowledge that a copy of this consent can be made available to me via my patient portal [name]), or I can request a printed copy by calling the office of [facility].    I understand that my insurance will be billed for this visit.   I have read or had this consent read to me. I understand the contents of this consent, which adequately explains the benefits and risks of the Services being provided via telemedicine.  I have been provided ample opportunity to ask questions regarding this consent and the Services and have had my questions answered to my satisfaction. I give my informed consent for the services to be provided through the use of telemedicine in my medical care

## 2022-03-17 NOTE — Telephone Encounter (Signed)
DPR ok to s/w the pt's wife. Pt's wife agreeable to plan of care for tele pre op appt 03/24/22 @ 2:20 for the pt. Med rec and consent are done.    I will send FYI to requesting office the pt has a tele appt, though to also see notes from pre op provider in regard to Plavix

## 2022-03-22 ENCOUNTER — Telehealth: Payer: Self-pay | Admitting: Family Medicine

## 2022-03-22 NOTE — Telephone Encounter (Signed)
Lonia Blood called from [facility] [phone] ext. [phone]  Looking for [facility] clearance faxed over on 7.25.23 for this patient  Patient's surgery is not scheduled yet  She will re-fax form just in case we do not have it

## 2022-03-22 NOTE — Telephone Encounter (Signed)
Not sure if I saw this or not. Do you have it by chance?

## 2022-03-23 NOTE — Telephone Encounter (Signed)
Received clearance form from [facility]; placed in Dr. Armanda Heritage box for when he returns to office tomorrow.

## 2022-03-23 NOTE — Telephone Encounter (Signed)
Noted.  Thanks.  I will work on it. 

## 2022-03-23 NOTE — Telephone Encounter (Signed)
Original form was already filled out and has been located. I faxed the form back to [facility].

## 2022-03-24 ENCOUNTER — Ambulatory Visit (INDEPENDENT_AMBULATORY_CARE_PROVIDER_SITE_OTHER): Payer: Medicare HMO | Admitting: Physician Assistant

## 2022-03-24 DIAGNOSIS — Z01810 Encounter for preprocedural cardiovascular examination: Secondary | ICD-10-CM | POA: Diagnosis not present

## 2022-03-24 NOTE — Progress Notes (Signed)
Virtual Visit via Telephone Note   Because of [name]'s co-morbid illnesses, he is at least at moderate risk for complications without adequate follow up.  This format is felt to be most appropriate for this patient at this time.  The patient did not have access to video technology/had technical difficulties with video requiring transitioning to audio format only (telephone).  All issues noted in this document were discussed and addressed.  No physical exam could be performed with this format.  Please refer to the patient's chart for his consent to telehealth for [facility].  Evaluation Performed:  Preoperative cardiovascular risk assessment _____________   Date:  03/24/2022   Patient ID:  [name], DOB 1926/02/07, MRN [id number] Patient Location:  Home Provider location:   Office  Primary Care Provider:  Joaquim Nam, MD Primary Cardiologist:  Lance Muss, MD  Chief Complaint / Patient Profile   88 y.o. 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  Past Medical History    Past Medical History:  Diagnosis Date   Back pain    improved with gabapentin as of 2015   Barrett esophagus 09/2003   Coronary artery calcification seen on CT scan    Diverticulitis    Diverticulosis    GERD (gastroesophageal reflux disease)    GI AVM (gastrointestinal arteriovenous vascular malformation)    Hearing loss    Hemorrhoids    History of Holter monitoring    a. holter 9/17: NSR, PVCs; no sustained arrhythmia; PVC burden 8%   History of nuclear stress test    a.  Myoview 8/17: Intermediate risk, inferior ischemia, not gated   History of prostatitis    History of stroke    Hyperlipidemia    Hypertension    Macular degeneration, wet (HCC)    receiving intra-ocular injections ([place])   PUD (peptic ulcer disease)    Past Surgical History:  Procedure Laterality Date   EP IMPLANTABLE DEVICE N/A 12/24/2015   Procedure: Loop Recorder Insertion;  Surgeon: Hillis Range, MD;  Location: [facility] INVASIVE CV LAB;  Service: Cardiovascular;  Laterality: N/A;   INGUINAL HERNIA REPAIR  2001   LAPAROSCOPIC APPENDECTOMY N/A 07/07/2014   Procedure: APPENDECTOMY LAPAROSCOPIC;  Surgeon: Manus Rudd, MD;  Location: [facility] OR;  Service: General;  Laterality: N/A;   TEE WITHOUT CARDIOVERSION N/A 10/28/2015   Procedure: TRANSESOPHAGEAL ECHOCARDIOGRAM (TEE);  Surgeon: Laurey Morale, MD;  Location: [facility] ENDOSCOPY;  Service: Cardiovascular;  Laterality: N/A;    Allergies  Allergies  Allergen Reactions   Angiotensin Receptor Blockers Swelling    Lip and face swelling prev noted   Atorvastatin Other (See Comments)    REACTION: muscles tense up    Codeine Nausea And Vomiting   Lisinopril Swelling and Other (See Comments)    Lips and face swelling   Statins Other (See Comments)    Muscle tense up   Gabapentin Other (See Comments)    Dizzy sensation    History of Present Illness    [name] is a 88 y.o. male who presents via audio/video conferencing for a telehealth visit today.  Pt was last seen in cardiology clinic on 01/11/2022 by Tereso Newcomer, PA.  At  that time [name] was doing well.  The patient is now pending procedure as outlined above. Since his last visit, he has been doing well without any chest pain or shortness of breath.  He has not had any palpitations.  He states that if he is out working hard in the yard he will have to go inside and lay down for a while.  He does own a backhoe and will toyed around with it from time to time.  He walks to the back of his  property which is about 200 feet to check on his groundhog traps.  He tells me today that he still thinking about surgery.  He is not sure if it is worth the risk.  He is scored a 5.07 on the DASI.  This exceeds the minimum 4 METS requirement.  We discussed his Plavix and the surgeon would like to stop 5 days prior to his surgery.  Since he is on Plavix for CVA, would need to discuss with his neurologist Dr. Pearlean Brownie or prescribing MD Dr. Para March.   Reports no shortness of breath nor dyspnea on exertion. Reports no chest pain, pressure, or tightness. No edema, orthopnea, PND. Reports no palpitations.    Home Medications    Prior to Admission medications   Medication Sig Start Date End Date Taking? Authorizing Provider  acetaminophen (TYLENOL) 500 MG tablet Take 1,000 mg by mouth every 6 (six) hours as needed for headache (pain).    [provider]  AFLIBERCEPT IO Inject into the eye See admin instructions. Right eye injection every 4 weeks    [provider]  Alirocumab (PRALUENT) 75 MG/ML SOAJ INJECT 75 MG INTO THE SKIN EVERY 14 (FOURTEEN) DAYS. 03/04/22   Corky Crafts, MD  Bevacizumab (AVASTIN IV) Place into the left eye See admin instructions. Left eye injection every 8 weeks    [provider]  cetirizine (ZYRTEC) 10 MG tablet Take 10 mg by mouth daily as needed for allergies.    [provider]  clopidogrel (PLAVIX) 75 MG tablet TAKE 1 TABLET BY MOUTH EVERY DAY 10/04/21   Joaquim Nam, MD  diltiazem (CARDIZEM CD) 180 MG 24 hr capsule TAKE 1 CAPSULE BY MOUTH EVERY DAY 03/14/22   Joaquim Nam, MD  doxazosin (CARDURA) 1 MG tablet Take 1 tablet (1 mg total) by mouth 2 (two) times daily. 01/03/22   Joaquim Nam, MD  famotidine (PEPCID) 20 MG tablet TAKE 1 TABLET (20 MG TOTAL) BY MOUTH 2 (TWO) TIMES DAILY AS NEEDED FOR HEARTBURN OR INDIGESTION. 12/16/21   Joaquim Nam, MD  ferrous sulfate 325 (65 FE) MG tablet Take 1 tablet (325 mg total) by mouth  daily with breakfast. 04/05/17   Joaquim Nam, MD  fluticasone [facility]) 50 MCG/ACT nasal spray Place 2 sprays into both nostrils daily as needed for allergies. 08/21/15   Joaquim Nam, MD  loperamide (IMODIUM) 2 MG capsule Take 1 capsule (2 mg total) by mouth as needed for diarrhea or loose stools. 09/22/20   Rolly Salter, MD  Multiple Vitamins-Minerals (OCUVITE ADULT 50+ PO) Take 1 capsule by mouth daily with lunch.    [provider]  nitroGLYCERIN (NITROSTAT) 0.4 MG SL tablet Place 1 tablet (0.4 mg total) under the tongue every 5 (five) minutes as needed for chest pain (max 3 doses in 15 minutes). 03/26/18   Karie Schwalbe, MD  pantoprazole (PROTONIX) 40 MG tablet TAKE 1 TABLET BY MOUTH EVERY DAY IN  THE MORNING 03/14/22   Joaquim Nam, MD  Polyethyl Glycol-Propyl Glycol (SYSTANE) 0.4-0.3 % SOLN Place 1 drop into both eyes 3 (three) times daily as needed (dry eyes).    [provider]  rOPINIRole (REQUIP) 0.5 MG tablet TAKE 1 TABLET BY MOUTH EVERYDAY AT BEDTIME 03/14/22   Joaquim Nam, MD  tamsulosin (FLOMAX) 0.4 MG CAPS capsule TAKE 1 CAPSULE BY MOUTH EVERY DAY 02/19/19   Joaquim Nam, MD  traMADol (ULTRAM) 50 MG tablet TAKE 1 TABLET BY MOUTH EVERY 8 HOURS AS NEEDED FOR PAIN 01/11/22   Joaquim Nam, MD  vitamin B-12 (CYANOCOBALAMIN) 1000 MCG tablet Take 2 tablets (2,000 mcg total) by mouth daily. 01/28/16   Joaquim Nam, MD    Physical Exam    Vital Signs:  [name] does not have vital signs available for review today.  Given telephonic nature of communication, physical exam is limited. AAOx3. NAD. Normal affect.  Speech and respirations are unlabored.  Accessory Clinical Findings    None  Assessment & Plan    1.  Preoperative Cardiovascular Risk Assessment: Mr. Roy perioperative risk of a major cardiac event is 6.6% according to the Revised Cardiac Risk Index (RCRI).  Therefore, he is at high risk for perioperative complications.   His  functional capacity is good at 5.07 METs according to the [place] Activity Status Index (DASI). Recommendations: According to ACC/AHA guidelines, no further cardiovascular testing needed.  The patient may proceed to surgery at acceptable risk.   Antiplatelet and/or Anticoagulation Recommendations: Plavix recommendations to come from Dr. Pearlean Brownie or Dr. Para March   A copy of this note will be routed to requesting surgeon.  Time:   Today, I have spent 15 minutes with the patient with telehealth technology discussing medical history, symptoms, and management plan.     Sharlene Dory, PA-C  03/24/2022, 2:50 PM

## 2022-03-26 DIAGNOSIS — H3530 Unspecified macular degeneration: Secondary | ICD-10-CM | POA: Diagnosis not present

## 2022-03-26 DIAGNOSIS — G2581 Restless legs syndrome: Secondary | ICD-10-CM | POA: Diagnosis not present

## 2022-03-26 DIAGNOSIS — J309 Allergic rhinitis, unspecified: Secondary | ICD-10-CM | POA: Diagnosis not present

## 2022-03-26 DIAGNOSIS — M1990 Unspecified osteoarthritis, unspecified site: Secondary | ICD-10-CM | POA: Diagnosis not present

## 2022-03-26 DIAGNOSIS — K2270 Barrett's esophagus without dysplasia: Secondary | ICD-10-CM | POA: Diagnosis not present

## 2022-03-26 DIAGNOSIS — I77819 Aortic ectasia, unspecified site: Secondary | ICD-10-CM | POA: Diagnosis not present

## 2022-03-26 DIAGNOSIS — I700 Atherosclerosis of aorta: Secondary | ICD-10-CM | POA: Diagnosis not present

## 2022-03-26 DIAGNOSIS — K219 Gastro-esophageal reflux disease without esophagitis: Secondary | ICD-10-CM | POA: Diagnosis not present

## 2022-03-26 DIAGNOSIS — N529 Male erectile dysfunction, unspecified: Secondary | ICD-10-CM | POA: Diagnosis not present

## 2022-03-26 DIAGNOSIS — I4891 Unspecified atrial fibrillation: Secondary | ICD-10-CM | POA: Diagnosis not present

## 2022-03-26 DIAGNOSIS — I25119 Atherosclerotic heart disease of native coronary artery with unspecified angina pectoris: Secondary | ICD-10-CM | POA: Diagnosis not present

## 2022-03-26 DIAGNOSIS — E785 Hyperlipidemia, unspecified: Secondary | ICD-10-CM | POA: Diagnosis not present

## 2022-03-30 ENCOUNTER — Telehealth: Payer: Self-pay | Admitting: Family Medicine

## 2022-03-30 NOTE — Telephone Encounter (Signed)
PLEASE NOTE: All timestamps contained within this report are represented as [place] Standard Time. CONFIDENTIALTY NOTICE: This fax transmission is intended only for the addressee. It contains information that is legally privileged, confidential or otherwise protected from use or disclosure. If you are not the intended recipient, you are strictly prohibited from reviewing, disclosing, copying using or disseminating any of this information or taking any action in reliance on or regarding this information. If you have received this fax in error, please notify us immediately by telephone so that we can arrange for its return to [place]. Phone: [phone], Toll-Free: [phone], Fax: [phone] Page: 1 of 1 Call Id: [id number] [facility] Day - Client Nonclinical Telephone Record  [facility] Client [facility] Day - Client Client Site  [facility] - Day Provider Raechel Ache - MD Contact Type Call Who Is Calling Patient / Member / Family / Caregiver Caller Name Bannon Giammarco Caller Phone Number [phone] Patient Name [name] Patient DOB 1926-05-26 Call Type Message Only Information Provided Reason for Call Request to Schedule Office Appointment Initial Comment Caller states they need to schedule an appointment for their husband. Disp. Time Disposition Final User 03/30/2022 11:28:05 AM General Information Provided Yes Davina Poke Call Closed By: Davina Poke Transaction Date/Time: 03/30/2022 11:19:52 AM (ET)

## 2022-03-30 NOTE — Telephone Encounter (Signed)
Pt's wife Kara Mead) called in concerning her husband, he has diarrhea, weakness and spells of can hardly stand up, he doesn't feel good. I transferred him over to nurse triage

## 2022-03-30 NOTE — Telephone Encounter (Signed)
Spoke to patient's wife and was advised that his symptoms have been going on for several months. Patient's wife stated that these symptoms come and go. Patient's wife stated that her husband refuses to see anyone except Dr. Para March. Appointment scheduled with Dr. Para March 04/08/22. Patient's wife was given ER precautions and she verbalized understanding.

## 2022-03-31 ENCOUNTER — Encounter: Payer: Self-pay | Admitting: Family Medicine

## 2022-03-31 ENCOUNTER — Ambulatory Visit (INDEPENDENT_AMBULATORY_CARE_PROVIDER_SITE_OTHER): Payer: Medicare HMO

## 2022-03-31 ENCOUNTER — Ambulatory Visit (INDEPENDENT_AMBULATORY_CARE_PROVIDER_SITE_OTHER): Payer: Medicare HMO | Admitting: Family Medicine

## 2022-03-31 VITALS — BP 134/70 | HR 79 | Temp 97.9°F | Ht 66.0 in | Wt 153.4 lb

## 2022-03-31 DIAGNOSIS — R051 Acute cough: Secondary | ICD-10-CM

## 2022-03-31 DIAGNOSIS — K449 Diaphragmatic hernia without obstruction or gangrene: Secondary | ICD-10-CM | POA: Diagnosis not present

## 2022-03-31 DIAGNOSIS — J441 Chronic obstructive pulmonary disease with (acute) exacerbation: Secondary | ICD-10-CM

## 2022-03-31 DIAGNOSIS — R059 Cough, unspecified: Secondary | ICD-10-CM | POA: Diagnosis not present

## 2022-03-31 LAB — POC COVID19 BINAXNOW: SARS Coronavirus 2 Ag: NEGATIVE

## 2022-03-31 MED ORDER — CEFDINIR 300 MG PO CAPS: 300.0000 mg | ORAL_CAPSULE | Freq: Two times a day (BID) | ORAL | 0 refills | Status: DC

## 2022-03-31 NOTE — Patient Instructions (Signed)
It was a pleasure meeting you today. Thank you for allowing me to take part in your health care.  Our goals for today as we discussed include:  Your COVID test is negative  For your increased cough and sputum we will do chest x-ray today.  I have sent a prescription in for antibiotics.  Please follow-up with your primary care doctor.  If you develop any shortness of breath difficulty breathing please go to the emergency department.   For your knee pain. Increase your tramadol to 3 times a day as previously ordered. You can take Tylenol 650 mg 3 times a day to help with the pain. Follow-up with your PCP as scheduled.   Please follow-up with PCP as scheduled  If you have any questions or concerns, please do not hesitate to call the office at [phone].  I look forward to our next visit and until then take care and stay safe.  Regards,   Dana Allan, MD   [place]

## 2022-03-31 NOTE — Telephone Encounter (Signed)
Noted. If symptoms persist or get worse one of the other providers will be happy to see him. If not follow up as scheduled with Dr. Para March

## 2022-03-31 NOTE — Progress Notes (Signed)
    SUBJECTIVE:   CHIEF COMPLAINT / HPI: Not feeling well  Presents to clinic with wife with concern for coughing up phlegm for 1 week.  Is any fevers, sick contacts, rhinorrhea, chest pain, difficulty breathing, shortness of breath, nausea or vomiting.  Denies any decrease in smell or taste.  Reports cough getting worse and having increased sputum production.  Appetite good, hydrating well.  History of tobacco use greater than 30 years.  PERTINENT  PMH / PSH:  Hypertension CAD COPD OBJECTIVE:   BP 134/70 (BP Location: Left Arm, Patient Position: Sitting, Cuff Size: Normal)   Pulse 79   Temp 97.9 F (36.6 C) (Oral)   Ht 5\' 6"  (1.676 m)   Wt 153 lb 6.4 oz (69.6 kg)   SpO2 98%   BMI 24.76 kg/m    General: Alert, no acute distress Cardio: Normal S1 and S2, RRR, no r/m/g Pulm: CTAB, normal work of breathing, no wheezing or crackles Abdomen: Bowel sounds normal. Abdomen soft and non-tender.  Extremities: No peripheral edema.    ASSESSMENT/PLAN:   COPD exacerbation Increased cough with increased sputum.  No worsening shortness of breath.  Benign lung exam.  Hemodynamically stable. -COVID test negative -2 view chest x-ray -Cefdinir 300 mg twice daily x5 days -Follow-up with PCP as scheduled. -Strict return precautions provided   PDMP reviewed  , MD

## 2022-03-31 NOTE — Telephone Encounter (Signed)
Noted. Thanks.

## 2022-04-01 ENCOUNTER — Telehealth: Payer: Self-pay | Admitting: Family Medicine

## 2022-04-01 LAB — BASIC METABOLIC PANEL
BUN: 14 mg/dL (ref 6–23)
CO2: 25 mEq/L (ref 19–32)
Calcium: 9.3 mg/dL (ref 8.4–10.5)
Chloride: 103 mEq/L (ref 96–112)
Creatinine, Ser: 1.14 mg/dL (ref 0.40–1.50)
GFR: 55.07 mL/min — ABNORMAL LOW (ref >60.00)
Glucose, Bld: 91 mg/dL (ref 70–99)
Potassium: 4 mEq/L (ref 3.5–5.1)
Sodium: 139 mEq/L (ref 135–145)

## 2022-04-01 LAB — CBC
HCT: 45.7 % (ref 39.0–52.0)
Hemoglobin: 15.2 g/dL (ref 13.0–17.0)
MCHC: 33.2 g/dL (ref 30.0–36.0)
MCV: 91.2 fl (ref 78.0–100.0)
Platelets: 244 10*3/uL (ref 150.0–400.0)
RBC: 5.02 Mil/uL (ref 4.22–5.81)
RDW: 13.6 % (ref 11.5–15.5)
WBC: 10 10*3/uL (ref 4.0–10.5)

## 2022-04-01 NOTE — Telephone Encounter (Signed)
Pt wife called in requesting result for lab and xray... Pt wife requesting callback.[name] KitchenMarland KitchenMarland Kitchen

## 2022-04-04 ENCOUNTER — Encounter: Payer: Self-pay | Admitting: Family Medicine

## 2022-04-06 ENCOUNTER — Telehealth: Payer: Self-pay

## 2022-04-06 NOTE — Progress Notes (Signed)
Spoke to the Patient with lab and xray results

## 2022-04-06 NOTE — Telephone Encounter (Signed)
Spoke to the Patient with lab and xray results 

## 2022-04-08 ENCOUNTER — Ambulatory Visit (INDEPENDENT_AMBULATORY_CARE_PROVIDER_SITE_OTHER): Payer: Medicare HMO | Admitting: Family Medicine

## 2022-04-08 ENCOUNTER — Encounter: Payer: Self-pay | Admitting: Family Medicine

## 2022-04-08 DIAGNOSIS — M2550 Pain in unspecified joint: Secondary | ICD-10-CM

## 2022-04-08 MED ORDER — TRAMADOL HCL 50 MG PO TABS: 50.0000 mg | ORAL_TABLET | Freq: Three times a day (TID) | ORAL | 3 refills | Status: DC | PRN

## 2022-04-08 NOTE — Progress Notes (Unsigned)
He isn't lightheaded on standing.  Routine cautions given to patient.  Prev diarrhea resolved.   Joint pain.  He had sig joint and muscles pain for few days.  He skipped praluent on 14th.  He had used tramadol for pain intermittently.  No FCNAVD.  No rash.  No tick bites.  No joint redness or puffiness.  No trigger or trauma o/w.  He feels back to baseline now.  Pain is resolved.  Meds, vitals, and allergies reviewed.   ROS: Per HPI unless specifically indicated in ROS section   GEN: nad, alert and oriented HEENT: ncat NECK: supple w/o LA CV: rrr PULM: ctab, no inc wob ABD: soft, +bs EXT: no edema SKIN: Well-perfused.

## 2022-04-08 NOTE — Assessment & Plan Note (Addendum)
Increased cough with increased sputum.  No worsening shortness of breath.  Benign lung exam.  Hemodynamically stable. -COVID test negative -2 view chest x-ray -Cefdinir 300 mg twice daily x5 days -Follow-up with PCP as scheduled. -Strict return precautions provided

## 2022-04-08 NOTE — Patient Instructions (Signed)
You could try one more dose of praluent and see if you have aches in the next 2 weeks.  Either way, let me know.  Use tramadol if needed.  Take care.  Glad to see you.

## 2022-04-10 NOTE — Assessment & Plan Note (Signed)
Of unclear source, now resolved.  Discussed options.  No obvious source.  We talked about potentially having joint aches related to Praluent.  He had been on that medication for an extended period of time.  I do not know of a good way to find out if the medication is causing the aches without him taking another dose of medication.  We talked about risks and benefits.  He felt the risk of repeating a dose was worthwhile, to find out if it was the culprit.  He can use tramadol as needed in the meantime.  He will take another dose of Praluent and then update me.

## 2022-04-13 ENCOUNTER — Telehealth: Payer: Self-pay

## 2022-04-13 NOTE — Chronic Care Management (AMB) (Signed)
    Chronic Care Management Pharmacy Assistant   Name: ULES MARSALA  MRN: [id number] DOB: 1926-04-29   Reason for Encounter: Reminder Call  Medications: Outpatient Encounter Medications as of 04/13/2022  Medication Sig   acetaminophen (TYLENOL) 500 MG tablet Take 1,000 mg by mouth every 6 (six) hours as needed for headache (pain).   AFLIBERCEPT IO Inject into the eye See admin instructions. Right eye injection every 4 weeks   Alirocumab (PRALUENT) 75 MG/ML SOAJ INJECT 75 MG INTO THE SKIN EVERY 14 (FOURTEEN) DAYS.   Bevacizumab (AVASTIN IV) Place into the left eye See admin instructions. Left eye injection every 8 weeks   cetirizine (ZYRTEC) 10 MG tablet Take 10 mg by mouth daily as needed for allergies.   clopidogrel (PLAVIX) 75 MG tablet TAKE 1 TABLET BY MOUTH EVERY DAY   diltiazem (CARDIZEM CD) 180 MG 24 hr capsule TAKE 1 CAPSULE BY MOUTH EVERY DAY   doxazosin (CARDURA) 1 MG tablet Take 1 tablet (1 mg total) by mouth 2 (two) times daily.   famotidine (PEPCID) 20 MG tablet TAKE 1 TABLET (20 MG TOTAL) BY MOUTH 2 (TWO) TIMES DAILY AS NEEDED FOR HEARTBURN OR INDIGESTION.   ferrous sulfate 325 (65 FE) MG tablet Take 1 tablet (325 mg total) by mouth daily with breakfast.   fluticasone (FLONASE) 50 MCG/ACT nasal spray Place 2 sprays into both nostrils daily as needed for allergies.   loperamide (IMODIUM) 2 MG capsule Take 1 capsule (2 mg total) by mouth as needed for diarrhea or loose stools.   Multiple Vitamins-Minerals (OCUVITE ADULT 50+ PO) Take 1 capsule by mouth daily with lunch.   nitroGLYCERIN (NITROSTAT) 0.4 MG SL tablet Place 1 tablet (0.4 mg total) under the tongue every 5 (five) minutes as needed for chest pain (max 3 doses in 15 minutes).   pantoprazole (PROTONIX) 40 MG tablet TAKE 1 TABLET BY MOUTH EVERY DAY IN THE MORNING   Polyethyl Glycol-Propyl Glycol (SYSTANE) 0.4-0.3 % SOLN Place 1 drop into both eyes 3 (three) times daily as needed (dry eyes).   rOPINIRole (REQUIP) 0.5 MG  tablet TAKE 1 TABLET BY MOUTH EVERYDAY AT BEDTIME   tamsulosin (FLOMAX) 0.4 MG CAPS capsule TAKE 1 CAPSULE BY MOUTH EVERY DAY   traMADol (ULTRAM) 50 MG tablet Take 1 tablet (50 mg total) by mouth every 8 (eight) hours as needed. for pain   vitamin B-12 (CYANOCOBALAMIN) 1000 MCG tablet Take 2 tablets (2,000 mcg total) by mouth daily.   No facility-administered encounter medications on file as of 04/13/2022.    [name] was contacted to remind of upcoming telephone visit with Al Corpus on 04/18/22 at 11:45am. Patient was reminded to have any blood glucose and blood pressure readings available for review at appointment.   Patient confirmed appointment.  Are you having any problems with your medications? No   Do you have any concerns you like to discuss with the pharmacist? No  CCM referral has been placed prior to visit?  No   Star Rating Drugs: Medication:  Last Fill: Day Supply No star medications identified   Advised pt to monitor BP for a week or 2 to ensure it has returned to normal (<140/90)  Al Corpus, CPP notified  Burt Knack, [facility] concierge  [phone]

## 2022-04-18 ENCOUNTER — Telehealth: Payer: Medicare HMO
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
## 2022-04-20 ENCOUNTER — Telehealth: Payer: Self-pay | Admitting: Family Medicine

## 2022-04-20 ENCOUNTER — Ambulatory Visit: Payer: Medicare HMO | Admitting: Pharmacist

## 2022-04-20 DIAGNOSIS — I10 Essential (primary) hypertension: Secondary | ICD-10-CM

## 2022-04-20 DIAGNOSIS — E782 Mixed hyperlipidemia: Secondary | ICD-10-CM

## 2022-04-20 DIAGNOSIS — M2550 Pain in unspecified joint: Secondary | ICD-10-CM

## 2022-04-20 DIAGNOSIS — I2510 Atherosclerotic heart disease of native coronary artery without angina pectoris: Secondary | ICD-10-CM

## 2022-04-20 NOTE — Progress Notes (Signed)
Chronic Care Management Pharmacy Note  04/21/2022 Name:  [name] MRN:  [id number] DOB:  Feb 01, 1926  Summary: CCM F/U visit -Pt previously held Praluent due to concern for arthralgias, he is not sure if pain was different off med; after discussion with PCP he has now re-challenged with 1 dose of Praluent and again is not sure if pain is different  Recommendations/Changes made from today's visit: -Advised 1 more trial of Praluent injection, keep diary of pain score for week before and week after injection to help determine if Praluent is contributing to arthralgia. Advised pt to call office with result  Follow up Plan: -Transition CCM to Self Care: Patient achieved CCM goals and no longer needs to be contacted as frequently. The patient has been provided with contact information for the care management team and has been advised to call with any health related questions or concerns.     Subjective: [name] is an 88 y.o. year old male who is a primary patient of Damita Dunnings, Elveria Rising, MD.  The CCM team was consulted for assistance with disease management and care coordination needs.    Engaged with patient by telephone for follow up visit in response to provider referral for pharmacy case management and/or care coordination services.   Consent to Services:  The patient was given information about Chronic Care Management services, agreed to services, and gave verbal consent prior to initiation of services.  Please see initial visit note for detailed documentation.   Patient Care Team: Tonia Ghent, MD as PCP - General (Family Medicine) Jettie Booze, MD as PCP - Cardiology (Cardiology) Sharmon Revere as Physician Assistant (Cardiology) Charlton Haws, [facility] as Pharmacist (Pharmacist)  Recent office visits: 04/08/22 Dr Damita Dunnings OV: arthralgia. May be due to Praluent, discussed benefits/risks, pt felt repeating dose would be worthwhile.   12/14/21 Dr Damita Dunnings OV:  fatigue - skip Praluent for next 2 doses.   11/22/21 TE - order cardiac monitor for irregular HR per home monitor. Refer to cardiology. 11/12/21 TE - high BP. Split doxazosin to 1 mg BID. 11/03/21 TE - high BP. Increase doxazosin to 2 mg.  10/20/21 Dr Damita Dunnings OV: HTN - rx'd doxazosin 1 mg 1/2 tab PRN BP > 150/90  Recent consult visits: 03/31/22 Dr Volanda Napoleon [facility]): COPD exacerbation; rx'd cefdinir 01/11/22 PA Richardson Dopp (cardiology): f/u, no changes.  03/29/21 Shelby Dubin (Audiology): f/u hearing loss - hearing aid troubleshooting. 10/30/20 Richardson Dopp T, PA-C-Cardiology (Coronary artery disease) Return in about 1 year   Hospital visits: None in previous 6 months  Objective:  Lab Results  Component Value Date   CREATININE 1.14 03/31/2022   BUN 14 03/31/2022   GFR 55.07 (L) 03/31/2022   GFRNONAA >60 08/30/2021   GFRAA 74 05/19/2017   NA 139 03/31/2022   K 4.0 03/31/2022   CALCIUM 9.3 03/31/2022   CO2 25 03/31/2022   GLUCOSE 91 03/31/2022    Lab Results  Component Value Date/Time   HGBA1C 6.0 (H) 03/30/2015 07:50 AM   HGBA1C 6.1 (H) 01/29/2015 04:41 AM   GFR 55.07 (L) 03/31/2022 04:05 PM   GFR 71.38 12/14/2021 11:41 AM    Last diabetic Eye exam: No results found for: "HMDIABEYEEXA"  Last diabetic Foot exam: No results found for: "HMDIABFOOTEX"   Lab Results  Component Value Date   CHOL 165 05/25/2021   HDL 64.70 05/25/2021   LDLCALC 84 05/25/2021   LDLDIRECT 167.2 08/12/2013   TRIG 77.0 05/25/2021  CHOLHDL 3 05/25/2021       Latest Ref Rng & Units 12/14/2021   11:41 AM 08/30/2021   12:17 PM 01/04/2021    3:03 PM  Hepatic Function  Total Protein 6.0 - 8.3 g/dL 6.4  6.7  6.8   Albumin 3.5 - 5.2 g/dL 3.9  3.6  4.2   AST 0 - 37 U/L 17  21  15    ALT 0 - 53 U/L 11  15  8    Alk Phosphatase 39 - 117 U/L 65  63  72   Total Bilirubin 0.2 - 1.2 mg/dL 0.5  0.4  0.3     Lab Results  Component Value Date/Time   TSH 2.87 12/14/2021 11:41 AM   TSH 2.225 09/21/2020  08:05 AM   TSH 2.59 01/27/2016 02:40 PM       Latest Ref Rng & Units 03/31/2022    4:05 PM 12/14/2021   11:41 AM 08/30/2021   12:17 PM  CBC  WBC 4.0 - 10.5 K/uL 10.0  8.0  6.6   Hemoglobin 13.0 - 17.0 g/dL 15.2  14.5  15.1   Hematocrit 39.0 - 52.0 % 45.7  43.3  47.6   Platelets 150.0 - 400.0 K/uL 244.0  244.0  250     No results found for: "VD25OH"  Clinical ASCVD: Yes  The ASCVD Risk score (Arnett DK, et al., 2019) failed to calculate for the following reasons:   The 2019 ASCVD risk score is only valid for ages 17 to 31       03/31/2022    2:56 PM 01/31/2022    2:23 PM 01/28/2021    2:04 PM  Depression screen PHQ 2/9  Decreased Interest 0 0 0  Down, Depressed, Hopeless 0 0 0  PHQ - 2 Score 0 0 0  Altered sleeping 0 0 0  Tired, decreased energy 0 0 0  Change in appetite 0 0 0  Feeling bad or failure about yourself  0 0 0  Trouble concentrating 0 0 0  Moving slowly or fidgety/restless 0 0 0  Suicidal thoughts 0 0 0  PHQ-9 Score 0 0 0  Difficult doing work/chores Not difficult at all Not difficult at all Not difficult at all     Social History   Tobacco Use  Smoking Status Former   Packs/day: 0.00   Years: 36.00   Total pack years: 0.00   Types: Cigarettes  Smokeless Tobacco Never  Tobacco Comments   Quit in 1983   BP Readings from Last 3 Encounters:  04/08/22 (!) 116/58  03/31/22 134/70  01/11/22 140/62   Pulse Readings from Last 3 Encounters:  04/08/22 81  03/31/22 79  01/11/22 68   Wt Readings from Last 3 Encounters:  04/08/22 156 lb (70.8 kg)  03/31/22 153 lb 6.4 oz (69.6 kg)  01/11/22 158 lb 3.2 oz (71.8 kg)   BMI Readings from Last 3 Encounters:  04/08/22 25.18 kg/m  03/31/22 24.76 kg/m  01/11/22 25.53 kg/m    Assessment/Interventions: Review of patient past medical history, allergies, medications, health status, including review of consultants reports, laboratory and other test data, was performed as part of comprehensive evaluation and  provision of chronic care management services.   SDOH:  (Social Determinants of Health) assessments and interventions performed: Yes  SDOH Screenings   Alcohol Screen: Low Risk  (01/31/2022)   Alcohol Screen    Last Alcohol Screening Score (AUDIT): 0  Depression (PHQ2-9): Low Risk  (03/31/2022)   Depression (PHQ2-9)  PHQ-2 Score: 0  Financial Resource Strain: Low Risk  (01/31/2022)   Overall Financial Resource Strain (CARDIA)    Difficulty of Paying Living Expenses: Not hard at all  Food Insecurity: No Food Insecurity (01/31/2022)   Hunger Vital Sign    Worried About Running Out of Food in the Last Year: Never true    Ran Out of Food in the Last Year: Never true  Housing: Low Risk  (01/31/2022)   Housing    Last Housing Risk Score: 0  Physical Activity: Sufficiently Active (01/31/2022)   Exercise Vital Sign    Days of Exercise per Week: 3 days    Minutes of Exercise per Session: 60 min  Social Connections: Moderately Isolated (01/31/2022)   Social Connection and Isolation Panel [NHANES]    Frequency of Communication with Friends and Family: More than three times a week    Frequency of Social Gatherings with Friends and Family: Once a week    Attends Religious Services: Never    Marine scientist or Organizations: No    Attends Archivist Meetings: Never    Marital Status: Married  Stress: No Stress Concern Present (01/31/2022)   Ricardo    Feeling of Stress : Not at all  Tobacco Use: Medium Risk (04/08/2022)   Patient History    Smoking Tobacco Use: Former    Smokeless Tobacco Use: Never    Passive Exposure: Not on file  Transportation Needs: No Transportation Needs (01/31/2022)   PRAPARE - Hydrologist (Medical): No    Lack of Transportation (Non-Medical): No    CCM Care Plan  Allergies  Allergen Reactions   Angiotensin Receptor Blockers Swelling    Lip and  face swelling prev noted   Atorvastatin Other (See Comments)    REACTION: muscles tense up    Codeine Nausea And Vomiting   Lisinopril Swelling and Other (See Comments)    Lips and face swelling   Statins Other (See Comments)    Muscle tense up   Gabapentin Other (See Comments)    Dizzy sensation    Medications Reviewed Today     Reviewed by Charlton Haws, [facility] (Pharmacist) on 04/20/22 at 1204  Med List Status: <None>   Medication Order Taking? Sig Documenting Provider Last Dose Status Informant  acetaminophen (TYLENOL) 500 MG tablet [id number] Yes Take 1,000 mg by mouth every 6 (six) hours as needed for headache (pain). [provider] Taking Active Spouse/Significant Other  AFLIBERCEPT IO [id number] Yes Inject into the eye See admin instructions. Right eye injection every 4 weeks [provider] Taking Active Spouse/Significant Other  Alirocumab (PRALUENT) 75 MG/ML SOAJ [id number] Yes INJECT 75 MG INTO THE SKIN EVERY 14 (FOURTEEN) DAYS. Jettie Booze, MD Taking Active   Bevacizumab (AVASTIN IV) [id number] Yes Place into the left eye See admin instructions. Left eye injection every 8 weeks [provider] Taking Active Spouse/Significant Other           Med Note Earna Coder Sep 28, 2020  2:12 PM)    cetirizine (ZYRTEC) 10 MG tablet [id number] Yes Take 10 mg by mouth daily as needed for allergies. [provider] Taking Active Spouse/Significant Other  clopidogrel (PLAVIX) 75 MG tablet [id number] Yes TAKE 1 TABLET BY MOUTH EVERY DAY Tonia Ghent, MD Taking Active   diltiazem [facility] CD) 180 MG 24 hr capsule [id number] Yes TAKE 1 CAPSULE BY MOUTH  EVERY DAY Tonia Ghent, MD Taking Active   doxazosin (CARDURA) 1 MG tablet [id number] Yes Take 1 tablet (1 mg total) by mouth 2 (two) times daily. Tonia Ghent, MD Taking Active   famotidine (PEPCID) 20 MG tablet [id number] Yes TAKE 1 TABLET (20 MG TOTAL) BY MOUTH 2 (TWO) TIMES DAILY AS  NEEDED FOR HEARTBURN OR INDIGESTION. Tonia Ghent, MD Taking Active   ferrous sulfate 325 (65 FE) MG tablet [id number] Yes Take 1 tablet (325 mg total) by mouth daily with breakfast. Tonia Ghent, MD Taking Active Spouse/Significant Other  fluticasone (FLONASE) 50 MCG/ACT nasal spray [id number] Yes Place 2 sprays into both nostrils daily as needed for allergies. Tonia Ghent, MD Taking Active Spouse/Significant Other  loperamide (IMODIUM) 2 MG capsule [id number] Yes Take 1 capsule (2 mg total) by mouth as needed for diarrhea or loose stools. Lavina Hamman, MD Taking Active   Multiple Vitamins-Minerals [facility] ADULT 50+ PO) [id number] Yes Take 1 capsule by mouth daily with lunch. [provider] Taking Active Spouse/Significant Other  nitroGLYCERIN (NITROSTAT) 0.4 MG SL tablet [id number] Yes Place 1 tablet (0.4 mg total) under the tongue every 5 (five) minutes as needed for chest pain (max 3 doses in 15 minutes). Venia Carbon, MD Taking Active Spouse/Significant Other           Med Note Earna Coder Sep 28, 2020  2:12 PM)    pantoprazole (PROTONIX) 40 MG tablet [id number] Yes TAKE 1 TABLET BY MOUTH EVERY DAY IN THE MORNING Tonia Ghent, MD Taking Active   Polyethyl Glycol-Propyl Glycol (SYSTANE) 0.4-0.3 % SOLN [id number] Yes Place 1 drop into both eyes 3 (three) times daily as needed (dry eyes). [provider] Taking Active Spouse/Significant Other  rOPINIRole (REQUIP) 0.5 MG tablet [id number] Yes TAKE 1 TABLET BY MOUTH EVERYDAY AT BEDTIME Tonia Ghent, MD Taking Active   tamsulosin (FLOMAX) 0.4 MG CAPS capsule [id number] Yes TAKE 1 CAPSULE BY MOUTH EVERY DAY Tonia Ghent, MD Taking Active Spouse/Significant Other  traMADol (ULTRAM) 50 MG tablet [id number] Yes Take 1 tablet (50 mg total) by mouth every 8 (eight) hours as needed. for pain Tonia Ghent, MD Taking Active   vitamin B-12 (CYANOCOBALAMIN) 1000 MCG tablet [id number] Yes Take 2 tablets (2,000  mcg total) by mouth daily. Tonia Ghent, MD Taking Active             Patient Active Problem List   Diagnosis Date Noted   Aortic atherosclerosis ([place]) 91/79/1505   Complicated UTI (urinary tract infection) 09/20/2020   Leukocytosis 09/20/2020   Generalized weakness 09/20/2020   Healthcare maintenance 01/27/2020   Unsteady 01/20/2019   Chest wall pain 05/27/2018   SI (sacroiliac) pain 03/26/2018   Testicle pain 08/06/2017   Dysuria 07/04/2017   Coronary artery disease involving native coronary artery of native heart without angina pectoris 05/04/2017   Prostatitis 03/28/2017   Cough 02/03/2017   Joint pain 03/30/2016   Lower back pain 03/30/2016   Tennis elbow 03/30/2016   Iron deficiency 03/07/2016   Restless leg syndrome 03/04/2016   Atypical chest pain 02/26/2016   Leg pain 01/28/2016   Vitamin B 12 deficiency 01/28/2016   Knee pain 12/06/2015   Fatigue 12/06/2015   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/27/2015   Palpitations 04/03/2015   Hyperlipidemia LDL goal <70 04/03/2015   Cerebral infarction due to stenosis of cerebral artery ([place]) 04/03/2015   History of stroke    Advance care planning 08/21/2014  Memory change 08/21/2014   Acute diverticulitis 08/16/2014   Ectatic abdominal aorta (HCC) 08/16/2014   Vomiting    COPD exacerbation (HCC)    Abdominal pain, lower    GERD (gastroesophageal reflux disease) 05/29/2014   Left flank pain, chronic 01/22/2013   Hypokalemia 12/28/2012   Angioedema of lips 12/28/2012   Medicare annual wellness visit, subsequent 08/15/2011   Angiodysplasia of intestine 11/30/2009   Hearing loss 07/27/2009   DYSPHAGIA 08/21/2008   Barrett's esophagus 05/16/2008   TEMPOROMANDIBULAR JOINT DISORDER 16/05/9603   UMBILICAL HERNIA 54/04/8118   Essential hypertension 05/15/2007   HEMORRHOIDS 05/15/2007    Immunization History  Administered Date(s) Administered   Fluad Quad(high Dose 65+) 06/03/2020   Influenza Split 06/07/2011, 06/07/2012    Influenza Whole 07/31/2007, 06/04/2008, 06/03/2009, 05/20/2010   Influenza, High Dose Seasonal PF 05/14/2019   Influenza,inj,Quad PF,6+ Mos 06/26/2013, 06/11/2014, 05/26/2015, 04/26/2016, 05/22/2017, 06/01/2018   Influenza-Unspecified 07/16/2021   PFIZER(Purple Top)SARS-COV-2 Vaccination 11/16/2019, 12/10/2019, 06/24/2020   Pfizer Covid-19 Vaccine Bivalent Booster 64yr & up 06/10/2021   Pneumococcal Conjugate-13 08/12/2013   Pneumococcal Polysaccharide-23 07/29/2010   Td 07/29/2010   Tdap 12/22/2017    Conditions to be addressed/monitored:  Hypertension, Hyperlipidemia, Coronary Artery Disease, GERD, and BPH  Care Plan : CAynor Updates made by FCharlton Haws RLanesvillesince 04/21/2022 12:00 AM     Problem: Hypertension, Hyperlipidemia, Coronary Artery Disease, GERD, and BPH   Priority: High     Long-Range Goal: Disease management   Start Date: 04/16/2021  Expected End Date: 04/21/2022  This Visit's Progress: On track  Recent Progress: On track  Priority: High  Note:   Current Barriers:  None identified  Pharmacist Clinical Goal(s):  Patient will contact provider office for questions/concerns as evidenced notation of same in electronic health record through collaboration with PharmD and provider.   Interventions: 1:1 collaboration with DTonia Ghent MD regarding development and update of comprehensive plan of care as evidenced by provider attestation and co-signature Inter-disciplinary care team collaboration (see longitudinal plan of care) Comprehensive medication review performed; medication list updated in electronic medical record  Hypertension (BP goal <150/90) -Controlled - per pt report  -Home BP: "normal" -Current treatment: Diltiazem CD 180 mg daily - Appropriate, Effective, Safe, Accessible Doxazosin 1 mg BID - Appropriate, Effective, Safe, Accessible -Medications previously tried/failed: amlodipine, furosemide, HCTZ, metoprolol -Current  dietary habits: vegetables from garden, meat 1-2 times per week -Current exercise habits: working in the yard, gardening -Educated on BP goals and benefits of medications for prevention of heart attack, stroke and kidney damage; Symptoms of hypotension and importance of maintaining adequate hydration; -Recommended to continue current medication  Hyperlipidemia / CAD: (LDL goal < 70) -Controlled - pt had Praluent for several weeks due to concern for athralgias; he is not sure he noticed a difference off med; he restarted Praluent for 1 dose and is still not sure if joint pain is different/worse on med -Hx CAD, CVA ("mini strokes" per patient) -Current treatment: Praluent 75 mg q14 days - Appropriate, Effective, Safe, Accessible Clopidogrel 75 mg daily - Appropriate, Effective, Safe, Accessible Nitroglycerin 0.4 mg SL prn - never used -Medications previously tried/failed: gemfibrozil -Educated on Cholesterol goals; importance of continued compliance with Praulent -Recommend to continue current medication; advised to keep a pain diary 1 week before and 1 week after next dose of Praluent; contact PCP if pain is worse after praluent  Arthralgia (Goal: manage pain) -Not ideally controlled -Current treatment  Tramadol 50 mg BID - Appropriate,  Effective, Safe, Accessible -Medications previously tried: n/a  -Recommended to continue current medication  Health Maintenance -Vaccine gaps: covid booster, Shingrix, flu -Pt reports he had covid booster at health dept 06/10/21. Updated chart. -Advised pt to get Flu vaccine ASAP  Patient Goals/Self-Care Activities Patient will:  - take medications as prescribed -focus on medication adherence by routine -check blood pressure periodically    Medication Assistance:  None - previously enrolled in Praluent PAP in 2022, not in 2023.  Compliance/Adherence/Medication fill history: Care Gaps: None  Star-Rating Drugs: None  Medication  Access: Within the past 30 days, how often has patient missed a dose of medication? 0 Is a pillbox or other method used to improve adherence? Yes  Factors that may affect medication adherence? no barriers identified Are meds synced by current pharmacy? No  Are meds delivered by current pharmacy? No  Does patient experience delays in picking up medications due to transportation concerns? No   [facility] Services Reviewed: Is patient disadvantaged to use [facility]?: No  Current Rx insurance plan: HTA Name and location of Current pharmacy:  [id number] [place]Phone: [phone]: [phone] [facility] services reviewed with patient today?: No  Patient requests to transfer care to [facility]?: No  Reason patient declined to change pharmacies: Not mentioned at this visit   Care Plan and Follow Up Patient Decision:  Patient agrees to Care Plan and Follow-up.  Plan: The patient has been provided with contact information for the care management team and has been advised to call with any health related questions or concerns.   LCharlene Brooke PharmD, BCitizens Baptist Medical CenterClinical Pharmacist LSt. Johns[facility] [phone]

## 2022-04-20 NOTE — Telephone Encounter (Signed)
Please check with patient about aches on Praluent.  If he had return of aches then I think it makes sense to stop the medication.  Please let me know how he is feeling.  Thanks.

## 2022-04-21 NOTE — Telephone Encounter (Signed)
Spoke to patient states that he has had one injection. He is only having normal aches in knees. Not any new or increased pain. Will call if any changes. He will continue with medication as prescribed.

## 2022-04-21 NOTE — Patient Instructions (Signed)
Visit Information  Phone number for Pharmacist: [phone]   Goals Addressed   None     Care Plan : CCM Pharmacy Care Plan  Updates made by Kathyrn Sheriff, RPH since 04/21/2022 12:00 AM     Problem: Hypertension, Hyperlipidemia, Coronary Artery Disease, GERD, and BPH   Priority: High     Long-Range Goal: Disease management   Start Date: 04/16/2021  Expected End Date: 04/21/2022  This Visit's Progress: On track  Recent Progress: On track  Priority: High  Note:   Current Barriers:  None identified  Pharmacist Clinical Goal(s):  Patient will contact provider office for questions/concerns as evidenced notation of same in electronic health record through collaboration with PharmD and provider.   Interventions: 1:1 collaboration with Joaquim Nam, MD regarding development and update of comprehensive plan of care as evidenced by provider attestation and co-signature Inter-disciplinary care team collaboration (see longitudinal plan of care) Comprehensive medication review performed; medication list updated in electronic medical record  Hypertension (BP goal <150/90) -Controlled - per pt report  -Home BP: "normal" -Current treatment: Diltiazem CD 180 mg daily - Appropriate, Effective, Safe, Accessible Doxazosin 1 mg BID - Appropriate, Effective, Safe, Accessible -Medications previously tried/failed: amlodipine, furosemide, HCTZ, metoprolol -Current dietary habits: vegetables from garden, meat 1-2 times per week -Current exercise habits: working in the yard, gardening -Educated on BP goals and benefits of medications for prevention of heart attack, stroke and kidney damage; Symptoms of hypotension and importance of maintaining adequate hydration; -Recommended to continue current medication  Hyperlipidemia / CAD: (LDL goal < 70) -Controlled - pt had Praluent for several weeks due to concern for athralgias; he is not sure he noticed a difference off med; he restarted  Praluent for 1 dose and is still not sure if joint pain is different/worse on med -Hx CAD, CVA ("mini strokes" per patient) -Current treatment: Praluent 75 mg q14 days - Appropriate, Effective, Safe, Accessible Clopidogrel 75 mg daily - Appropriate, Effective, Safe, Accessible Nitroglycerin 0.4 mg SL prn - never used -Medications previously tried/failed: gemfibrozil -Educated on Cholesterol goals; importance of continued compliance with Praulent -Recommend to continue current medication; advised to keep a pain diary 1 week before and 1 week after next dose of Praluent; contact PCP if pain is worse after praluent  Arthralgia (Goal: manage pain) -Not ideally controlled -Current treatment  Tramadol 50 mg BID - Appropriate, Effective, Safe, Accessible -Medications previously tried: n/a  -Recommended to continue current medication  Health Maintenance -Vaccine gaps: covid booster, Shingrix, flu -Pt reports he had covid booster at health dept 06/10/21. Updated chart. -Advised pt to get Flu vaccine ASAP  Patient Goals/Self-Care Activities Patient will:  - take medications as prescribed -focus on medication adherence by routine -check blood pressure periodically      The patient verbalized understanding of instructions, educational materials, and care plan provided today and DECLINED offer to receive copy of patient instructions, educational materials, and care plan.  The patient has been provided with contact information for the care management team and has been advised to call with any health related questions or concerns.    Al Corpus, PharmD, BCACP Clinical Pharmacist [facility] [phone]

## 2022-04-21 NOTE — Telephone Encounter (Signed)
Noted. Thanks.

## 2022-04-28 DIAGNOSIS — H353231 Exudative age-related macular degeneration, bilateral, with active choroidal neovascularization: Secondary | ICD-10-CM | POA: Diagnosis not present

## 2022-04-28 DIAGNOSIS — H02132 Senile ectropion of right lower eyelid: Secondary | ICD-10-CM | POA: Diagnosis not present

## 2022-04-28 DIAGNOSIS — H35423 Microcystoid degeneration of retina, bilateral: Secondary | ICD-10-CM | POA: Diagnosis not present

## 2022-04-28 DIAGNOSIS — H43813 Vitreous degeneration, bilateral: Secondary | ICD-10-CM | POA: Diagnosis not present

## 2022-05-27 ENCOUNTER — Ambulatory Visit (INDEPENDENT_AMBULATORY_CARE_PROVIDER_SITE_OTHER): Payer: Medicare HMO | Admitting: Family Medicine

## 2022-05-27 ENCOUNTER — Encounter: Payer: Self-pay | Admitting: Family Medicine

## 2022-05-27 ENCOUNTER — Telehealth: Payer: Self-pay | Admitting: Family Medicine

## 2022-05-27 VITALS — BP 146/72 | HR 74 | Temp 97.5°F | Ht 66.0 in | Wt 155.2 lb

## 2022-05-27 DIAGNOSIS — R5383 Other fatigue: Secondary | ICD-10-CM

## 2022-05-27 DIAGNOSIS — R42 Dizziness and giddiness: Secondary | ICD-10-CM

## 2022-05-27 DIAGNOSIS — E559 Vitamin D deficiency, unspecified: Secondary | ICD-10-CM | POA: Diagnosis not present

## 2022-05-27 DIAGNOSIS — R052 Subacute cough: Secondary | ICD-10-CM

## 2022-05-27 NOTE — Telephone Encounter (Signed)
Noted patient seen

## 2022-05-27 NOTE — Patient Instructions (Addendum)
Stop at lab on way out.  Star home vertigo exercises to desensitized inner ear.  Can try flonase 2 sprays per nostril daily for possible allergies.

## 2022-05-27 NOTE — Telephone Encounter (Addendum)
Cathy RN at access said pt having generalized weakness, lightheaded, and both legs hurt mostly in knees. Cathy transferred pts wife to me for appt within 4 hrs. I spoke with pts wife (DPR signed)and symptoms have been going on for couple of months on and off and symptoms are about the same per pts wife. No CP,SOB,H/A, and no swelling or redness in legs.pt is not having pain anywhere now. No known injury. Pt cannot stand and work for 5 mins without getting weak and having to sit down. (Pt is 88 yrs old).[name] said pt feels really bad. No lightheadedness now but pt is weak. Pt refuses appt today with any other provider and I spoke with Dr Para March, Dr Para March does not have appt until Mon and Dr Para March said pt should not wait to be seen; [name] said that she will get her son to help her to get pt to [facility] today. Cannot come this morning. Scheduled appt with Dr Ermalene Searing 05/27/22 at 3:20 with UC & ED precautions. [name] voiced understanding and will have pt at that appt. Sending note to Dr Ermalene Searing, Dr Para March as Lorain Childes to PCP and Lupita Leash CMA.    [facility] Day - Client TELEPHONE ADVICE RECORD AccessNurse Patient Name: [name] Gender: Male DOB: Jul 29, 1926 Age: 88 Y 7 M 9 D Return Phone Number: [phone] (Primary) Address: City[place]/ [place]  [address] Client [facility] Day - Client Client Site [facility] - Day Provider Raechel Ache - MD Contact Type Call Who Is Calling Patient / Member / Family / Caregiver Call Type Triage / Clinical Caller Name [name] Relationship To Patient Spouse Return Phone Number [phone] (Primary) Chief Complaint Dizziness Reason for Call Symptomatic / Request for Health Information Initial Comment Caller says husband has been weak, dizzy with leg pain. She says she has this by spells; he is feeling well at the moment. Translation No Nurse Assessment Nurse: Charna Elizabeth, RN, Cathy Date/Time  (Eastern Time): 05/27/2022 10:22:34 AM Confirm and document reason for call. If symptomatic, describe symptoms. ---Caller states that [place] has been spells of dizziness and weakness with bilateral leg pain (current pain rated as severe) on and off the past 4 months ago. No severe breathing difficulty. No chest pain. Alert and responsive. Does the patient have any new or worsening symptoms? ---Yes Will a triage be completed? ---Yes Related visit to physician within the last 2 weeks? ---No Does the PT have any chronic conditions? (i.e. diabetes, asthma, this includes High risk factors for pregnancy, etc.) ---Yes List chronic conditions. ---High Blood Pressure, Hearing Loss, Stroke in the past Is this a behavioral health or substance abuse call? ---No Guidelines Guideline Title Affirmed Question Affirmed Notes Nurse Date/Time (Eastern Time) Dizziness - Lightheadedness [1] Dizziness caused by heat exposure, sudden standing, or poor fluid intake AND [2] no improvement after Charna Elizabeth, RN, Cathy 05/27/2022 10:26:11 AM PLEASE NOTE: All timestamps contained within this report are represented as [place] Standard Time. CONFIDENTIALTY NOTICE: This fax transmission is intended only for the addressee. It contains information that is legally privileged, confidential or otherwise protected from use or disclosure. If you are not the intended recipient, you are strictly prohibited from reviewing, disclosing, copying using or disseminating any of this information or taking any action in reliance on or regarding this information. If you have received this fax in error, please notify us immediately by telephone so that we can arrange for its return to [place]. Phone: [phone],  Toll-Free: [phone], Fax: [phone] Page: 2 of 3 Call Id: [id number] Guidelines Guideline Title Affirmed Question Affirmed Notes Nurse Date/Time Eilene Ghazi Time) 2 hours of rest and fluids Leg Pain History of  inherited increased risk of blood clots (e.g., Factor 5 Leiden, Anti-thrombin 3, Protein C or Protein S deficiency, Prothrombin mutation) Trumbull, RN, [facility] 05/27/2022 10:31:34 AM Disp. Time Eilene Ghazi Time) Disposition Final User 05/27/2022 10:30:13 AM See HCP within 4 Hours (or PCP triage) Vallery Sa, RN, [facility] [place] 05/27/2022 10:33:10 AM See HCP within 4 Hours (or PCP triage) Yes Vallery Sa, RN, T[place] Final Disposition 05/27/2022 10:33:10 AM See HCP within 4 Hours (or PCP triage) Yes Vallery Sa, RN, Rosey [place] Disagree/Comply Comply Caller Understands Yes PreDisposition Call Doctor Care Advice Given Per Guideline SEE HCP (OR PCP TRIAGE) WITHIN 4 HOURS: * IF OFFICE WILL BE OPEN: You need to be seen within the next 3 or 4 hours. Call your doctor (or NP/PA) now or as soon as the office opens. DRINK FLUIDS: * Drink several glasses of fruit juice, other clear fluids or water. * This will improve hydration and blood glucose. * If the weather is hot or you have a fever, make sure the fluids are cold. LIE DOWN AND REST: * Lie down with feet elevated for 1 hour. * This will improve circulation and increase blood flow to the brain. CALL BACK IF: * Passes out (faints) * You become worse CARE ADVICE given per Dizziness (Adult) guideline. SEE HCP (OR PCP TRIAGE) WITHIN 4 HOURS: * IF OFFICE WILL BE OPEN: You need to be seen within the next 3 or 4 hours. Call your doctor (or NP/PA) now or as soon as the office opens. CALL EMS IF: * You develop any chest pain or shortness of breath. CALL BACK IF: * You become worse CARE ADVICE given per Leg Pain (Adult) guideline

## 2022-05-27 NOTE — Progress Notes (Signed)
Patient ID: [name], male    DOB: November 24, 1925, 88 y.o.   MRN: [id number]  This visit was conducted in person.  BP (!) 146/72   Pulse 74   Temp (!) 97.5 F (36.4 C) (Oral)   Ht 5\' 6"  (1.676 m)   Wt 155 lb 4 oz (70.4 kg)   SpO2 94%   BMI 25.06 kg/m    CC:  Chief Complaint  Patient presents with   Cough    With clear phlegm   Fatigue    After he works for 10 minutes   Dizziness    Subjective:   HPI: [name] is a 88 y.o. male  patient of Dr. 97 with history of  HTN, CVA COPD, iron deficiency presenting on 05/27/2022 for Cough (With clear phlegm), Fatigue (After he works for 10 minutes), and Dizziness  Patient and wife appear to be unreliable historians, and there is a difficulty in understanding the timeline of the symptoms.  For the most part they seem fairly chronic.  He  always have a cough but it is worse in last few months. Clear mucus production is newer.  He has been noting dizziness or " funny feeling in head when moving real quick.. feels unsteady.   Feeling tired for a while.  Good appetite, moderate water intake.  No SOB, no CP.  No bleeding. No fever  Treated for COPD exacerbation in 03/31/22 note reviewed Thinks he felt better. Reviewed testing.. no COVID,   Sick contacts: none   Denies depression.      Relevant past medical, surgical, family and social history reviewed and updated as indicated. Interim medical history since our last visit reviewed. Allergies and medications reviewed and updated. Outpatient Medications Prior to Visit  Medication Sig Dispense Refill   acetaminophen (TYLENOL) 500 MG tablet Take 1,000 mg by mouth every 6 (six) hours as needed for headache (pain).     AFLIBERCEPT IO Inject into the eye See admin instructions. Right eye injection every 4 weeks     Alirocumab (PRALUENT) 75 MG/ML SOAJ INJECT 75 MG INTO THE SKIN EVERY 14 (FOURTEEN) DAYS. 2 mL 11   Bevacizumab (AVASTIN IV) Place into the left eye See admin  instructions. Left eye injection every 8 weeks     cetirizine (ZYRTEC) 10 MG tablet Take 10 mg by mouth daily as needed for allergies.     clopidogrel (PLAVIX) 75 MG tablet TAKE 1 TABLET BY MOUTH EVERY DAY 90 tablet 3   diltiazem (CARDIZEM CD) 180 MG 24 hr capsule TAKE 1 CAPSULE BY MOUTH EVERY DAY 90 capsule 0   doxazosin (CARDURA) 1 MG tablet Take 1 tablet (1 mg total) by mouth 2 (two) times daily. 180 tablet 3   famotidine (PEPCID) 20 MG tablet TAKE 1 TABLET (20 MG TOTAL) BY MOUTH 2 (TWO) TIMES DAILY AS NEEDED FOR HEARTBURN OR INDIGESTION. 180 tablet 3   ferrous sulfate 325 (65 FE) MG tablet Take 1 tablet (325 mg total) by mouth daily with breakfast.     fluticasone (FLONASE) 50 MCG/ACT nasal spray Place 2 sprays into both nostrils daily as needed for allergies. 16 g 5   loperamide (IMODIUM) 2 MG capsule Take 1 capsule (2 mg total) by mouth as needed for diarrhea or loose stools. 30 capsule 0   Multiple Vitamins-Minerals (OCUVITE ADULT 50+ PO) Take 1 capsule by mouth daily with lunch.     nitroGLYCERIN (NITROSTAT) 0.4 MG SL tablet Place 1 tablet (0.4 mg total) under  the tongue every 5 (five) minutes as needed for chest pain (max 3 doses in 15 minutes). 25 tablet 0   pantoprazole (PROTONIX) 40 MG tablet TAKE 1 TABLET BY MOUTH EVERY DAY IN THE MORNING 90 tablet 1   Polyethyl Glycol-Propyl Glycol (SYSTANE) 0.4-0.3 % SOLN Place 1 drop into both eyes 3 (three) times daily as needed (dry eyes).     rOPINIRole (REQUIP) 0.5 MG tablet TAKE 1 TABLET BY MOUTH EVERYDAY AT BEDTIME 90 tablet 0   tamsulosin (FLOMAX) 0.4 MG CAPS capsule TAKE 1 CAPSULE BY MOUTH EVERY DAY 90 capsule 3   traMADol (ULTRAM) 50 MG tablet Take 1 tablet (50 mg total) by mouth every 8 (eight) hours as needed. for pain 90 tablet 3   vitamin B-12 (CYANOCOBALAMIN) 1000 MCG tablet Take 2 tablets (2,000 mcg total) by mouth daily.     No facility-administered medications prior to visit.     Per HPI unless specifically indicated in ROS  section below Review of Systems  Constitutional:  Positive for fatigue.  Neurological:  Positive for dizziness and weakness.  Psychiatric/Behavioral:  Negative for dysphoric mood.    Objective:  BP (!) 146/72   Pulse 74   Temp (!) 97.5 F (36.4 C) (Oral)   Ht 5\' 6"  (1.676 m)   Wt 155 lb 4 oz (70.4 kg)   SpO2 94%   BMI 25.06 kg/m   Wt Readings from Last 3 Encounters:  05/27/22 155 lb 4 oz (70.4 kg)  04/08/22 156 lb (70.8 kg)  03/31/22 153 lb 6.4 oz (69.6 kg)      Physical Exam Constitutional:      Appearance: He is well-developed.     Comments: Elderly  HENT:     Head: Normocephalic.     Right Ear: Hearing normal.     Left Ear: Hearing normal.     Nose: Nose normal.  Neck:     Thyroid: No thyroid mass or thyromegaly.     Vascular: No carotid bruit.     Trachea: Trachea normal.  Cardiovascular:     Rate and Rhythm: Normal rate and regular rhythm.     Pulses: Normal pulses.     Heart sounds: Heart sounds not distant. No murmur heard.    No friction rub. No gallop.     Comments: No peripheral edema Pulmonary:     Effort: Pulmonary effort is normal. No respiratory distress.     Breath sounds: Normal breath sounds.  Skin:    General: Skin is warm and dry.     Findings: No rash.  Psychiatric:        Speech: Speech normal.        Behavior: Behavior normal.        Thought Content: Thought content normal.       Results for orders placed or performed in visit on 03/31/22  CBC  Result Value Ref Range   WBC 10.0 4.0 - 10.5 K/uL   RBC 5.02 4.22 - 5.81 Mil/uL   Platelets 244.0 150.0 - 400.0 K/uL   Hemoglobin 15.2 13.0 - 17.0 g/dL   HCT 45.7 39.0 - 52.0 %   MCV 91.2 78.0 - 100.0 fl   MCHC 33.2 30.0 - 36.0 g/dL   RDW 13.6 11.5 - 62.3 %  Basic Metabolic Panel (BMET)  Result Value Ref Range   Sodium 139 135 - 145 mEq/L   Potassium 4.0 3.5 - 5.1 mEq/L   Chloride 103 96 - 112 mEq/L   CO2 25 19 -  32 mEq/L   Glucose, Bld 91 70 - 99 mg/dL   BUN 14 6 - 23 mg/dL    Creatinine, Ser 3.54 0.40 - 1.50 mg/dL   GFR 65.68 (L) >12.75 mL/min   Calcium 9.3 8.4 - 10.5 mg/dL  POC [id number]  Result Value Ref Range   SARS Coronavirus 2 Ag Negative Negative     COVID 19 screen:  No recent travel or known exposure to COVID19 The patient denies respiratory symptoms of COVID 19 at this time. The importance of social distancing was discussed today.   Assessment and Plan    Problem List Items Addressed This Visit     Cough    Chronic cough likely due to COPD possibly slightly worse with recent increase in production of clear mucus.  I wonder given the seasonal association if it is due to allergies.  He will start Flonase 2 sprays per nostril daily to his Zyrtec at bedtime.  There is no clear evidence of COPD exacerbation and no clear sign of bacterial infection      Fatigue - Primary    This appears to be going on for quite some time.  I will reevaluate with labs to determine if there is any new secondary causes.  He denies chest pain or shortness of breath suggesting cardiac or pulmonary cause. He denies depression as possible cause of symptoms.      Relevant Orders   CBC with Differential/Platelet   Comprehensive metabolic panel   TSH   Vitamin B12   VITAMIN D 25 Hydroxy (Vit-D Deficiency, Fractures)   Vertigo    Of note it appears she has had vertigo in the past.  His symptoms are much more consistent with BPPV as opposed to dizziness like presyncope.  There is no orthostatic component. We will start with home desensitizing exercises for vertigo.        Kerby Nora, MD

## 2022-05-27 NOTE — Assessment & Plan Note (Signed)
Chronic cough likely due to COPD possibly slightly worse with recent increase in production of clear mucus.  I wonder given the seasonal association if it is due to allergies.  He will start Flonase 2 sprays per nostril daily to his Zyrtec at bedtime.  There is no clear evidence of COPD exacerbation and no clear sign of bacterial infection

## 2022-05-27 NOTE — Telephone Encounter (Signed)
Patient wife Terrence Dupont called in and stated that [name] has been weak and experiencing some dizziness and wobbling. She stated that he has been having some leg pain and his legs been feeling weak. Offered her other appointments with other providers today but he only wants to see Dr. Damita Dunnings. She was wanting to know if he could be seen on Monday when she comes in at 61 for her appointment. Sent over to access nurse.

## 2022-05-27 NOTE — Assessment & Plan Note (Signed)
This appears to be going on for quite some time.  I will reevaluate with labs to determine if there is any new secondary causes.  He denies chest pain or shortness of breath suggesting cardiac or pulmonary cause. He denies depression as possible cause of symptoms.

## 2022-05-27 NOTE — Assessment & Plan Note (Signed)
Of note it appears she has had vertigo in the past.  His symptoms are much more consistent with BPPV as opposed to dizziness like presyncope.  There is no orthostatic component. We will start with home desensitizing exercises for vertigo.

## 2022-05-28 LAB — CBC WITH DIFFERENTIAL/PLATELET
Absolute Monocytes: 722 cells/uL (ref 200–950)
Basophils Absolute: 59 cells/uL (ref 0–200)
Basophils Relative: 0.7 %
Eosinophils Absolute: 294 cells/uL (ref 15–500)
Eosinophils Relative: 3.5 %
HCT: 45.8 % (ref 38.5–50.0)
Hemoglobin: 15.5 g/dL (ref 13.2–17.1)
Lymphs Abs: 1655 cells/uL (ref 850–3900)
MCH: 30.2 pg (ref 27.0–33.0)
MCHC: 33.8 g/dL (ref 32.0–36.0)
MCV: 89.3 fL (ref 80.0–100.0)
MPV: 10.1 fL (ref 7.5–12.5)
Monocytes Relative: 8.6 %
Neutro Abs: 5670 cells/uL (ref 1500–7800)
Neutrophils Relative %: 67.5 %
Platelets: 251 10*3/uL (ref 140–400)
RBC: 5.13 10*6/uL (ref 4.20–5.80)
RDW: 12.4 % (ref 11.0–15.0)
Total Lymphocyte: 19.7 %
WBC: 8.4 10*3/uL (ref 3.8–10.8)

## 2022-05-28 LAB — VITAMIN D 25 HYDROXY (VIT D DEFICIENCY, FRACTURES): Vit D, 25-Hydroxy: 42 ng/mL (ref 30–100)

## 2022-05-28 LAB — COMPREHENSIVE METABOLIC PANEL
AG Ratio: 1.4 (calc) (ref 1.0–2.5)
ALT: 8 U/L — ABNORMAL LOW (ref 9–46)
AST: 15 U/L (ref 10–35)
Albumin: 4.1 g/dL (ref 3.6–5.1)
Alkaline phosphatase (APISO): 83 U/L (ref 35–144)
BUN: 13 mg/dL (ref 7–25)
CO2: 24 mmol/L (ref 20–32)
Calcium: 9.5 mg/dL (ref 8.6–10.3)
Chloride: 104 mmol/L (ref 98–110)
Creat: 1.14 mg/dL (ref 0.70–1.22)
Globulin: 2.9 g/dL (calc) (ref 1.9–3.7)
Glucose, Bld: 76 mg/dL (ref 65–99)
Potassium: 4.4 mmol/L (ref 3.5–5.3)
Sodium: 139 mmol/L (ref 135–146)
Total Bilirubin: 0.5 mg/dL (ref 0.2–1.2)
Total Protein: 7 g/dL (ref 6.1–8.1)

## 2022-05-28 LAB — TSH: TSH: 3.78 mIU/L (ref 0.40–4.50)

## 2022-05-28 LAB — VITAMIN B12: Vitamin B-12: 1657 pg/mL — ABNORMAL HIGH (ref 200–1100)

## 2022-06-09 DIAGNOSIS — H35423 Microcystoid degeneration of retina, bilateral: Secondary | ICD-10-CM | POA: Diagnosis not present

## 2022-06-09 DIAGNOSIS — H43813 Vitreous degeneration, bilateral: Secondary | ICD-10-CM | POA: Diagnosis not present

## 2022-06-09 DIAGNOSIS — H353231 Exudative age-related macular degeneration, bilateral, with active choroidal neovascularization: Secondary | ICD-10-CM | POA: Diagnosis not present

## 2022-06-09 DIAGNOSIS — H02132 Senile ectropion of right lower eyelid: Secondary | ICD-10-CM | POA: Diagnosis not present

## 2022-06-14 ENCOUNTER — Encounter: Payer: Self-pay | Admitting: Family Medicine

## 2022-06-14 ENCOUNTER — Ambulatory Visit (INDEPENDENT_AMBULATORY_CARE_PROVIDER_SITE_OTHER): Payer: Medicare HMO | Admitting: Family Medicine

## 2022-06-14 VITALS — BP 124/70 | HR 97 | Temp 97.4°F | Ht 66.0 in | Wt 160.0 lb

## 2022-06-14 DIAGNOSIS — R300 Dysuria: Secondary | ICD-10-CM | POA: Diagnosis not present

## 2022-06-14 DIAGNOSIS — Z23 Encounter for immunization: Secondary | ICD-10-CM | POA: Diagnosis not present

## 2022-06-14 LAB — POC URINALSYSI DIPSTICK (AUTOMATED)
Bilirubin, UA: NEGATIVE
Blood, UA: NEGATIVE
Glucose, UA: NEGATIVE
Ketones, UA: NEGATIVE
Nitrite, UA: NEGATIVE
Protein, UA: NEGATIVE
Spec Grav, UA: 1.015 (ref 1.010–1.025)
Urobilinogen, UA: 0.2 E.U./dL
pH, UA: 6 (ref 5.0–8.0)

## 2022-06-14 MED ORDER — SULFAMETHOXAZOLE-TRIMETHOPRIM 800-160 MG PO TABS: 1.0000 | ORAL_TABLET | Freq: Two times a day (BID) | ORAL | 0 refills | Status: DC

## 2022-06-14 NOTE — Progress Notes (Unsigned)
Recently with sig nocturia, night before last.  Last night was better.  No burning with urination but he noted an odor.  No fevers. No abd pain.  He is drinking about 2-3 bottles of water a day, at baseline.  1 cup coffee daily.    He restarted praluent in the meantime but doesn't have muscle aches.    Meds, vitals, and allergies reviewed.   ROS: Per HPI unless specifically indicated in ROS section   Nad Ncat Neck supple, no LA Rrr Ctab Abd soft, not ttp Ext w/o edema.

## 2022-06-14 NOTE — Patient Instructions (Signed)
Hold the antibiotic prescription for now.  If you have more trouble urinating, like the night before last, then start the medicine.  We'll update you about your extra urine test that is still pending.  If you have other troubles then let me know.   Take care.  Glad to see you.

## 2022-06-15 NOTE — Assessment & Plan Note (Signed)
With improved symptoms in the meantime.  Urinalysis discussed with patient.  Hold Septra for now.  If any progression of symptoms then start and let me know.  Will await urine culture.  He agrees to plan.  Okay for outpatient follow-up.

## 2022-06-16 LAB — URINE CULTURE
MICRO NUMBER:: 14093385
SPECIMEN QUALITY:: ADEQUATE

## 2022-07-10 ENCOUNTER — Other Ambulatory Visit: Payer: Self-pay | Admitting: Family Medicine

## 2022-07-10 DIAGNOSIS — G2581 Restless legs syndrome: Secondary | ICD-10-CM

## 2022-07-28 DIAGNOSIS — H353231 Exudative age-related macular degeneration, bilateral, with active choroidal neovascularization: Secondary | ICD-10-CM | POA: Diagnosis not present

## 2022-08-12 ENCOUNTER — Telehealth: Payer: Self-pay | Admitting: Family Medicine

## 2022-08-12 ENCOUNTER — Other Ambulatory Visit: Payer: Self-pay | Admitting: Family Medicine

## 2022-08-12 NOTE — Telephone Encounter (Signed)
Per access nurse note pt was advised to be seen in 24 hrs but pts wife told access that pt will only see Dr Para March and was advised that Dr Para March is out of office. Pts wife has already called [facility] and scheduled appt with Dr Para March on 08/19/22.pt advice was given by access nurse to pts wife if condition worsened. Sending note to Dr Para March who is out of office, Mayra Reel NP who is in office and [place] [facility].

## 2022-08-12 NOTE — Telephone Encounter (Signed)
Noted. Thanks.

## 2022-08-12 NOTE — Telephone Encounter (Signed)
Patient wife called and stated patient has dizzy spells and head has been spinning, patient went down on his knees yesterday and he had to crawl to get up. Patient was sent to access nurse.

## 2022-08-12 NOTE — Telephone Encounter (Signed)
I am not in clinic today.  If he is getting worse then I want him to get rechecked in the meantime.  Thanks.

## 2022-08-12 NOTE — Telephone Encounter (Signed)
[facility] Day - Client TELEPHONE ADVICE RECORD Access[facility]urse Patient Name: [name] Gender: Male DOB: November 27, 1925 Age: 88 Y 9 M 25 D Return Phone Number: [phone] (Primary) Address: City/ State/ [place]  [address] Client [name] - Client Client Site  [facility] - Day Provider Raechel Ache - MD Contact Type Call Who Is Calling Patient / Member / Family / Caregiver Call Type Triage / Clinical Caller Name Chou Busler Relationship To Patient Spouse Return Phone Number [phone] (Primary) Chief Complaint Dizziness Reason for Call Symptomatic / Request for Health Information Initial Comment Caller states her husband has dizzy spell, he went down to his and had to crawl to get him up. He will no see anyone else but Dr. Para March and he will not see anyone else. Translation No Nurse Assessment Nurse: Stefano Gaul, RN, Dwana Curd Date/Time (Eastern Time): 08/12/2022 11:33:58 AM Confirm and document reason for call. If symptomatic, describe symptoms. ---Caller states spouse had dizzy spell this am and yesterday. went to his knees yesterday. He is swimmyheaded. Does the patient have any new or worsening symptoms? ---Yes Will a triage be completed? ---Yes Related visit to physician within the last 2 weeks? ---Yes Does the PT have any chronic conditions? (i.e. diabetes, asthma, this includes High risk factors for pregnancy, etc.) ---Yes List chronic conditions. ---HTN Is this a behavioral health or substance abuse call? ---No Guidelines Guideline Title Affirmed Question Affirmed Notes Nurse Date/Time (Eastern Time) Dizziness - Lightheadedness Taking a medicine that could cause dizziness (e.g., blood pressure medications, diuretics) Stefano Gaul, RN, Dwana Curd 08/12/2022 11:37:02 AM PLEASE NOTE: All timestamps contained within this report are represented as [place] Standard Time. CONFIDENTIALTY  NOTICE: This fax transmission is intended only for the addressee. It contains information that is legally privileged, confidential or otherwise protected from use or disclosure. If you are not the intended recipient, you are strictly prohibited from reviewing, disclosing, copying using or disseminating any of this information or taking any action in reliance on or regarding this information. If you have received this fax in error, please notify us immediately by telephone so that we can arrange for its return to [place]. Phone: [phone], Toll-Free: [phone], Fax: [phone] Page: 2 of 2 Call Id: [id number] Disp. Time Lamount Cohen Time) Disposition Final User 08/12/2022 11:43:41 AM See PCP within 24 Hours Yes Stefano Gaul, RN, Dwana Curd Final Disposition 08/12/2022 11:43:41 AM See PCP within 24 Hours Yes Stefano Gaul, RN, Clerance Lav Disagree/Comply Disagree Caller Understands Yes PreDisposition Call Doctor Care Advice Given Per Guideline * IF OFFICE WILL BE OPEN: You need to be examined within the next 24 hours. Call your doctor (or NP/PA) when the office opens and make an appointment. SEE PCP WITHIN 24 HOURS: CALL BACK IF: * Passes out (faints) * You become worse CARE ADVICE given per Dizziness (Adult) guideline. Comments User: Art Buff, RN Date/Time Lamount Cohen Time): 08/12/2022 11:43:12 AM spouse states pt will not see anyone but Dr. Para March and he is out of the office until Dec 29. Referrals GO TO FACILITY REFUSE

## 2022-08-12 NOTE — Telephone Encounter (Signed)
I spoke with pt; pt notified as instructed and voiced understanding. Pt said he is better now and will wait for appt with Dr Para March. Pt said if condition worsens he will go to UC or ED if he has to. UC & ED precautions given again and pt voiced understanding.Sending note to Dr Para March.

## 2022-08-19 ENCOUNTER — Encounter: Payer: Self-pay | Admitting: Family Medicine

## 2022-08-19 ENCOUNTER — Ambulatory Visit (INDEPENDENT_AMBULATORY_CARE_PROVIDER_SITE_OTHER): Payer: Medicare HMO | Admitting: Family Medicine

## 2022-08-19 VITALS — BP 130/60 | HR 92 | Temp 97.9°F | Ht 66.0 in | Wt 157.0 lb

## 2022-08-19 DIAGNOSIS — R0981 Nasal congestion: Secondary | ICD-10-CM | POA: Diagnosis not present

## 2022-08-19 DIAGNOSIS — U071 COVID-19: Secondary | ICD-10-CM | POA: Diagnosis not present

## 2022-08-19 LAB — POC COVID19 BINAXNOW: SARS Coronavirus 2 Ag: POSITIVE — AB

## 2022-08-19 NOTE — Patient Instructions (Signed)
I presume your symptoms were related to covid but should get better.  Take care.  Glad to see you.

## 2022-08-19 NOTE — Progress Notes (Signed)
He got a good night of sleep last night.  Feels better today.    Episodic vertigo.  No sx now.  He didn't think praluent was contributing to his recent sx.    He had fallen once when his knees gave out.  Wife saw it happen.  No syncope.  He was able to get back to the bed and then lay down.  At that point the room was spinning.  Noted with quick movements.  He thought the sensation of movement was always in the same direction but not constant.    Nephew had covid, found out about that on Christmas day.  At some point, patient was around him (around the 20th).  He had a cough recently and he felt "awful" at the time, with headache.  Recent inc in nasal congestion.   Minimal cough now.  No HA now.  Tramadol helps his aches when needed.  He isn't lightheaded now.    Meds, vitals, and allergies reviewed.   ROS: Per HPI unless specifically indicated in ROS section   GEN: nad, alert and oriented HEENT: mucous membranes moist, TM wnl B NECK: supple w/o LA CV: rrr. PULM: ctab, no inc wob ABD: soft, +bs EXT: no edema SKIN: Well-perfused.  COVID-positive at office visit.  Discussed.

## 2022-08-21 DIAGNOSIS — U071 COVID-19: Secondary | ICD-10-CM | POA: Insufficient documentation

## 2022-08-21 NOTE — Assessment & Plan Note (Signed)
Likely the cause of his episodic vertigo and nasal congestion.  Both of improved in the meantime.  He is out of the timeframe for treatment and quarantine.  He clearly has improved in the meantime.  Reassured and he will update me as needed.  Routine cautions given to patient.

## 2022-08-31 ENCOUNTER — Telehealth: Payer: Self-pay | Admitting: Family Medicine

## 2022-08-31 ENCOUNTER — Telehealth: Payer: Self-pay

## 2022-08-31 ENCOUNTER — Other Ambulatory Visit (HOSPITAL_COMMUNITY): Payer: Self-pay

## 2022-08-31 NOTE — Telephone Encounter (Signed)
Pharmacy Patient Advocate Encounter   Received notification from [facility] that prior authorization for Praluent 75MG /ML is required/requested.    PA submitted on 1.10.24 to (ins) [employer] via CoverMyMeds Key [id number] Status is pending

## 2022-08-31 NOTE — Telephone Encounter (Signed)
Pt called in stated he would need a pre approval for RX Alirocumab (PRALUENT) 75 MG/ML SOAJ  . For his new insurance . Please advise [phone]

## 2022-08-31 NOTE — Telephone Encounter (Signed)
Patient needs PA completed for Tramadol in order to get a 30 day supply. Please complete

## 2022-08-31 NOTE — Telephone Encounter (Signed)
Prescription Request  08/31/2022  Is this a "Controlled Substance" medicine? No  LOV: 08/19/2022  What is the name of the medication or equipment? traMADol (ULTRAM) 50 MG tablet   Have you contacted your pharmacy to request a refill? No   Which pharmacy would you like this sent to?  [id number] [address], [place] [address] [place] [address] Phone: [phone] Fax: [phone]    Patient notified that their request is being sent to the clinical staff for review and that they should receive a response within 2 business days.   Please advise at Mobile There is no such number on file (mobile).

## 2022-09-01 NOTE — Telephone Encounter (Signed)
Pharmacy Patient Advocate Encounter  Prior Authorization for Praluent 75MG /ML has been approved.    PA# [id number]  Effective dates: 1.10.24 through 1.10.25  Spoke with Pharmacy to process.

## 2022-09-02 ENCOUNTER — Other Ambulatory Visit (HOSPITAL_COMMUNITY): Payer: Self-pay

## 2022-09-02 NOTE — Telephone Encounter (Signed)
Pharmacy Patient Advocate Encounter   Received notification from [facility] that prior authorization for Tramadol 50mg  tab is required/requested.    PA submitted on 09/02/22 to (ins) [employer] via CoverMyMeds fax Key [id number] Status is pending   [phone]

## 2022-09-06 ENCOUNTER — Other Ambulatory Visit (HOSPITAL_COMMUNITY): Payer: Self-pay

## 2022-09-09 ENCOUNTER — Telehealth: Payer: Self-pay | Admitting: Family Medicine

## 2022-09-09 MED ORDER — TRAMADOL HCL 50 MG PO TABS: 50.0000 mg | ORAL_TABLET | Freq: Three times a day (TID) | ORAL | 3 refills | Status: DC | PRN

## 2022-09-09 NOTE — Telephone Encounter (Signed)
Prescription Request  09/09/2022  Is this a "Controlled Substance" medicine? No  LOV: 08/19/2022  What is the name of the medication or equipment? traMADol (ULTRAM) 50 MG tablet   Have you contacted your pharmacy to request a refill? No   Which pharmacy would you like this sent to?  [id number] [place], [place] - [address] [place] [address] Phone: [phone] Fax: [phone]    Patient notified that their request is being sent to the clinical staff for review and that they should receive a response within 2 business days.   Please advise at [place]

## 2022-09-09 NOTE — Telephone Encounter (Signed)
Sent. Thanks.   

## 2022-09-09 NOTE — Telephone Encounter (Signed)
LOV - 08/19/22 NOV - not scheduled RF - 04/08/22 #90/3

## 2022-09-09 NOTE — Addendum Note (Signed)
Addended by: Tonia Ghent on: 09/09/2022 01:55 PM   Modules accepted: Orders

## 2022-09-13 ENCOUNTER — Telehealth: Payer: Self-pay | Admitting: Family Medicine

## 2022-09-13 NOTE — Telephone Encounter (Signed)
Patient has been scheduled for 09/20/2022.

## 2022-09-13 NOTE — Telephone Encounter (Signed)
Pt's wife called stating the pt has been feeling weak & wanted the pt to see [facility]/ told pt's wife, Damita Dunnings doesn't have availability until 09/22/22. Pt's wife stated the pt won't see any other provider & wanted to ask Damita Dunnings if he'll find a way to squeeze pt in soon? Call back # [phone]

## 2022-09-15 DIAGNOSIS — H43813 Vitreous degeneration, bilateral: Secondary | ICD-10-CM | POA: Diagnosis not present

## 2022-09-15 DIAGNOSIS — H35423 Microcystoid degeneration of retina, bilateral: Secondary | ICD-10-CM | POA: Diagnosis not present

## 2022-09-15 DIAGNOSIS — H35033 Hypertensive retinopathy, bilateral: Secondary | ICD-10-CM | POA: Diagnosis not present

## 2022-09-15 DIAGNOSIS — H353221 Exudative age-related macular degeneration, left eye, with active choroidal neovascularization: Secondary | ICD-10-CM | POA: Diagnosis not present

## 2022-09-15 DIAGNOSIS — H353211 Exudative age-related macular degeneration, right eye, with active choroidal neovascularization: Secondary | ICD-10-CM | POA: Diagnosis not present

## 2022-09-16 ENCOUNTER — Encounter (HOSPITAL_COMMUNITY): Payer: Self-pay

## 2022-09-16 ENCOUNTER — Emergency Department (HOSPITAL_COMMUNITY): Payer: PPO

## 2022-09-16 ENCOUNTER — Emergency Department (HOSPITAL_COMMUNITY)
Admission: EM | Admit: 2022-09-16 | Discharge: 2022-09-17 | Disposition: A | Discharge status: Home / Self Care | Payer: PPO | Attending: Emergency Medicine | Admitting: Emergency Medicine

## 2022-09-16 ENCOUNTER — Other Ambulatory Visit: Payer: Self-pay

## 2022-09-16 DIAGNOSIS — G4489 Other headache syndrome: Secondary | ICD-10-CM | POA: Diagnosis not present

## 2022-09-16 DIAGNOSIS — I619 Nontraumatic intracerebral hemorrhage, unspecified: Secondary | ICD-10-CM | POA: Diagnosis not present

## 2022-09-16 DIAGNOSIS — Z79899 Other long term (current) drug therapy: Secondary | ICD-10-CM | POA: Insufficient documentation

## 2022-09-16 DIAGNOSIS — J449 Chronic obstructive pulmonary disease, unspecified: Secondary | ICD-10-CM | POA: Diagnosis not present

## 2022-09-16 DIAGNOSIS — R4182 Altered mental status, unspecified: Secondary | ICD-10-CM | POA: Diagnosis not present

## 2022-09-16 DIAGNOSIS — I6381 Other cerebral infarction due to occlusion or stenosis of small artery: Secondary | ICD-10-CM | POA: Diagnosis not present

## 2022-09-16 DIAGNOSIS — Z7902 Long term (current) use of antithrombotics/antiplatelets: Secondary | ICD-10-CM | POA: Insufficient documentation

## 2022-09-16 DIAGNOSIS — I159 Secondary hypertension, unspecified: Secondary | ICD-10-CM | POA: Diagnosis not present

## 2022-09-16 DIAGNOSIS — R531 Weakness: Secondary | ICD-10-CM | POA: Insufficient documentation

## 2022-09-16 DIAGNOSIS — R42 Dizziness and giddiness: Secondary | ICD-10-CM

## 2022-09-16 DIAGNOSIS — Z8616 Personal history of COVID-19: Secondary | ICD-10-CM | POA: Diagnosis not present

## 2022-09-16 DIAGNOSIS — K449 Diaphragmatic hernia without obstruction or gangrene: Secondary | ICD-10-CM | POA: Diagnosis not present

## 2022-09-16 DIAGNOSIS — I4510 Unspecified right bundle-branch block: Secondary | ICD-10-CM | POA: Diagnosis not present

## 2022-09-16 DIAGNOSIS — R5383 Other fatigue: Secondary | ICD-10-CM | POA: Diagnosis not present

## 2022-09-16 DIAGNOSIS — R1084 Generalized abdominal pain: Secondary | ICD-10-CM | POA: Diagnosis not present

## 2022-09-16 DIAGNOSIS — R519 Headache, unspecified: Secondary | ICD-10-CM | POA: Diagnosis not present

## 2022-09-16 DIAGNOSIS — I10 Essential (primary) hypertension: Secondary | ICD-10-CM | POA: Diagnosis not present

## 2022-09-16 DIAGNOSIS — I6782 Cerebral ischemia: Secondary | ICD-10-CM | POA: Diagnosis not present

## 2022-09-16 HISTORY — DX: COVID-19: U07.1

## 2022-09-16 LAB — COMPREHENSIVE METABOLIC PANEL
ALT: 28 U/L (ref 0–44)
AST: 26 U/L (ref 15–41)
Albumin: 3.3 g/dL — ABNORMAL LOW (ref 3.5–5.0)
Alkaline Phosphatase: 80 U/L (ref 38–126)
Anion gap: 12 (ref 5–15)
BUN: 13 mg/dL (ref 8–23)
CO2: 22 mmol/L (ref 22–32)
Calcium: 8.5 mg/dL — ABNORMAL LOW (ref 8.9–10.3)
Chloride: 102 mmol/L (ref 98–111)
Creatinine, Ser: 1 mg/dL (ref 0.61–1.24)
GFR, Estimated: >60 mL/min (ref >60)
Glucose, Bld: 106 mg/dL — ABNORMAL HIGH (ref 70–99)
Potassium: 3.5 mmol/L (ref 3.5–5.1)
Sodium: 136 mmol/L (ref 135–145)
Total Bilirubin: 0.4 mg/dL (ref 0.3–1.2)
Total Protein: 6.4 g/dL — ABNORMAL LOW (ref 6.5–8.1)

## 2022-09-16 LAB — URINALYSIS, ROUTINE W REFLEX MICROSCOPIC
Bilirubin Urine: NEGATIVE
Glucose, UA: NEGATIVE mg/dL
Hgb urine dipstick: NEGATIVE
Ketones, ur: 5 mg/dL — AB
Leukocytes,Ua: NEGATIVE
Nitrite: NEGATIVE
Protein, ur: NEGATIVE mg/dL
Specific Gravity, Urine: 1.014 (ref 1.005–1.030)
pH: 6 (ref 5.0–8.0)

## 2022-09-16 LAB — CBC
HCT: 44.2 % (ref 39.0–52.0)
Hemoglobin: 15.2 g/dL (ref 13.0–17.0)
MCH: 29.8 pg (ref 26.0–34.0)
MCHC: 34.4 g/dL (ref 30.0–36.0)
MCV: 86.7 fL (ref 80.0–100.0)
Platelets: 196 10*3/uL (ref 150–400)
RBC: 5.1 MIL/uL (ref 4.22–5.81)
RDW: 12.3 % (ref 11.5–15.5)
WBC: 5.1 10*3/uL (ref 4.0–10.5)
nRBC: 0 % (ref 0.0–0.2)

## 2022-09-16 LAB — CBG MONITORING, ED: Glucose-Capillary: 106 mg/dL — ABNORMAL HIGH (ref 70–99)

## 2022-09-16 LAB — TROPONIN I (HIGH SENSITIVITY): Troponin I (High Sensitivity): 16 ng/L (ref <18)

## 2022-09-16 MED ORDER — HYDRALAZINE HCL 25 MG PO TABS: 25.0000 mg | ORAL_TABLET | Freq: Once | ORAL | Status: DC

## 2022-09-16 MED ORDER — HYDRALAZINE HCL 25 MG PO TABS
25.0000 mg | ORAL_TABLET | Freq: Once | ORAL | Status: AC
  Administered 2022-09-16: 25 mg via ORAL
  Filled 2022-09-16: qty 1

## 2022-09-16 MED ORDER — DILTIAZEM HCL ER COATED BEADS 180 MG PO CP24
180.0000 mg | ORAL_CAPSULE | Freq: Once | ORAL | Status: AC
  Administered 2022-09-16: 180 mg via ORAL
  Filled 2022-09-16: qty 1

## 2022-09-16 MED ORDER — ACETAMINOPHEN 500 MG PO TABS
1000.0000 mg | ORAL_TABLET | ORAL | Status: AC
  Administered 2022-09-16: 1000 mg via ORAL
  Filled 2022-09-16: qty 2

## 2022-09-16 NOTE — ED Notes (Signed)
Pt is irritable, disoriented to time upon this RN's assessment when asked what year it is, but can tell me what month it is. Pt unable to say why he is in the hospital. Pt's wife reports that pt only didn't take his BP meds this morning and reports that pt not knowing where he is at started this afternoon. Pt's wife reports this behavior is unlike him. Pt's wife reports intermittent headaches, dizziness, and balance issues x 2-3 days.

## 2022-09-16 NOTE — ED Notes (Signed)
Patient to MRI.

## 2022-09-16 NOTE — ED Notes (Signed)
Pt transported to CT at this time.

## 2022-09-16 NOTE — ED Notes (Signed)
2 dark green, 1 light green, 1 lav, 2 gold and 1 blue top collected and sent to lab along w/ urine

## 2022-09-16 NOTE — ED Notes (Addendum)
Patient insistent on wanting something to drink, stating "I'm going to die of thirst."  Patient informed that he was NPO except for meds and educated on what that meant, and was educated that he would get something to drink when I brought him his meds.  Patient irritable that I did not have the medication yet but I informed him that the pharmacy was working as quickly as they could to send his medication.

## 2022-09-16 NOTE — ED Provider Notes (Signed)
[facility] Provider Note   CSN: [id number] Arrival date & time: 09/16/22  1642     History {Add pertinent medical, surgical, social history, OB history to HPI:1} Chief Complaint  Patient presents with   AMS   Hypertension    [name] is a 89 y.o. male.  Staggering when walking Dizzy Iddn't take medicationt today Felt like the room was going around while he was in bed Also having headaches        Home Medications Prior to Admission medications   Medication Sig Start Date End Date Taking? Authorizing Provider  acetaminophen (TYLENOL) 500 MG tablet Take 1,000 mg by mouth every 6 (six) hours as needed for headache (pain).    [provider]  AFLIBERCEPT IO Inject into the eye See admin instructions. Right eye injection every 4 weeks    [provider]  Alirocumab (PRALUENT) 75 MG/ML SOAJ INJECT 75 MG INTO THE SKIN EVERY 14 (FOURTEEN) DAYS. 03/04/22   Jettie Booze, MD  Bevacizumab (AVASTIN IV) Place into the left eye See admin instructions. Left eye injection every 8 weeks    [provider]  cetirizine (ZYRTEC) 10 MG tablet Take 10 mg by mouth daily as needed for allergies.    [provider]  clopidogrel (PLAVIX) 75 MG tablet TAKE 1 TABLET BY MOUTH EVERY DAY 10/04/21   Tonia Ghent, MD  diltiazem (CARDIZEM CD) 180 MG 24 hr capsule TAKE 1 CAPSULE BY MOUTH EVERY DAY 08/12/22   Tonia Ghent, MD  doxazosin (CARDURA) 1 MG tablet Take 1 tablet (1 mg total) by mouth 2 (two) times daily. 01/03/22   Tonia Ghent, MD  famotidine (PEPCID) 20 MG tablet TAKE 1 TABLET (20 MG TOTAL) BY MOUTH 2 (TWO) TIMES DAILY AS NEEDED FOR HEARTBURN OR INDIGESTION. 12/16/21   Tonia Ghent, MD  ferrous sulfate 325 (65 FE) MG tablet Take 1 tablet (325 mg total) by mouth daily with breakfast. 04/05/17   Tonia Ghent, MD  fluticasone [facility]) 50 MCG/ACT nasal spray Place 2 sprays into both nostrils daily  as needed for allergies. 08/21/15   Tonia Ghent, MD  loperamide (IMODIUM) 2 MG capsule Take 1 capsule (2 mg total) by mouth as needed for diarrhea or loose stools. 09/22/20   Lavina Hamman, MD  Multiple Vitamins-Minerals (OCUVITE ADULT 50+ PO) Take 1 capsule by mouth daily with lunch.    [provider]  nitroGLYCERIN (NITROSTAT) 0.4 MG SL tablet Place 1 tablet (0.4 mg total) under the tongue every 5 (five) minutes as needed for chest pain (max 3 doses in 15 minutes). 03/26/18   Venia Carbon, MD  pantoprazole (PROTONIX) 40 MG tablet TAKE 1 TABLET BY MOUTH EVERY DAY IN THE MORNING 07/11/22   Tonia Ghent, MD  Polyethyl Glycol-Propyl Glycol (SYSTANE) 0.4-0.3 % SOLN Place 1 drop into both eyes 3 (three) times daily as needed (dry eyes).    [provider]  rOPINIRole (REQUIP) 0.5 MG tablet TAKE 1 TABLET BY MOUTH EVERYDAY AT BEDTIME 07/11/22   Tonia Ghent, MD  tamsulosin (FLOMAX) 0.4 MG CAPS capsule TAKE 1 CAPSULE BY MOUTH EVERY DAY 02/19/19   Tonia Ghent, MD  traMADol (ULTRAM) 50 MG tablet Take 1 tablet (50 mg total) by mouth every 8 (eight) hours as needed. for pain 09/09/22   Tonia Ghent, MD  vitamin B-12 (CYANOCOBALAMIN) 1000 MCG tablet Take 2 tablets (2,000 mcg total) by mouth daily.  01/28/16   Tonia Ghent, MD      Allergies    Angiotensin receptor blockers, Atorvastatin, Codeine, Lisinopril, Statins, and Gabapentin    Review of Systems   Review of Systems  Physical Exam Updated Vital Signs BP (!) 191/114   Pulse 85   Temp 97.7 F (36.5 C) (Oral)   Resp 14   Ht 5\' 6"  (1.676 m)   Wt 71.2 kg   SpO2 97%   BMI 25.34 kg/m  Physical Exam  ED Results / Procedures / Treatments   Labs (all labs ordered are listed, but only abnormal results are displayed) Labs Reviewed  COMPREHENSIVE METABOLIC PANEL - Abnormal; Notable for the following components:      Result Value   Glucose, Bld 106 (*)    Calcium 8.5 (*)    Total Protein 6.4 (*)     Albumin 3.3 (*)    All other components within normal limits  URINALYSIS, ROUTINE W REFLEX MICROSCOPIC - Abnormal; Notable for the following components:   Ketones, ur 5 (*)    All other components within normal limits  CBG MONITORING, ED - Abnormal; Notable for the following components:   Glucose-Capillary 106 (*)    All other components within normal limits  CBC    EKG None  Radiology CT Head Wo Contrast  Result Date: 09/16/2022 CLINICAL DATA:  Altered mental status. Weakness, headache, diminished appetite. EXAM: CT HEAD WITHOUT CONTRAST TECHNIQUE: Contiguous axial images were obtained from the base of the skull through the vertex without intravenous contrast. RADIATION DOSE REDUCTION: This exam was performed according to the departmental dose-optimization program which includes automated exposure control, adjustment of the mA and/or kV according to patient size and/or use of iterative reconstruction technique. COMPARISON:  Head CT 12/22/2017 FINDINGS: Brain: No evidence of acute infarction, hemorrhage, hydrocephalus, extra-axial collection or mass lesion/mass effect. Age related atrophy and chronic small vessel ischemic change. Punctate remote lacunar infarct in the left caudate. Vascular: Atherosclerosis of skullbase vasculature without hyperdense vessel or abnormal calcification. Skull: No fracture or focal lesion. Sinuses/Orbits: Mucosal thickening throughout the ethmoid air cells. Chronic opacification of left mastoid air cells. Postsurgical change in both globes. Rightward nasal septal deviation. Other: None. IMPRESSION: 1. No acute intracranial abnormality. 2. Age related atrophy and chronic small vessel ischemic change. Electronically Signed   By: Keith Rake M.D.   On: 09/16/2022 19:11    Procedures Procedures  {Document cardiac monitor, telemetry assessment procedure when appropriate:1}  Medications Ordered in ED Medications - No data to display  ED Course/ Medical Decision  Making/ A&P   {   Click here for ABCD2, HEART and other calculatorsREFRESH Note before signing :1}                          Medical Decision Making Amount and/or Complexity of Data Reviewed Labs: ordered. Radiology: ordered.   ***  {Document critical care time when appropriate:1} {Document review of labs and clinical decision tools ie heart score, Chads2Vasc2 etc:1}  {Document your independent review of radiology images, and any outside records:1} {Document your discussion with family members, caretakers, and with consultants:1} {Document social determinants of health affecting pt's care:1} {Document your decision making why or why not admission, treatments were needed:1} Final Clinical Impression(s) / ED Diagnoses Final diagnoses:  None    Rx / DC Orders ED Discharge Orders     None

## 2022-09-16 NOTE — ED Notes (Signed)
Patient's wife updated on status of MRI as she is upset about wait and patient not being able to eat or drink anything.

## 2022-09-16 NOTE — ED Notes (Signed)
Patient back from MRI.

## 2022-09-16 NOTE — ED Triage Notes (Signed)
Pt arrived to ED via EMS from home. Pt had covid 3 weeks ago and is still having general weakness, decreased appetite, headache, and was noted to be 90% RA. Pt has not been taking his meds, unknown how long that has been going on. Pt is HOH. Pt A&Ox4. Pt thrashing around in bed, crying out for his wife. Behaviors not appropriate for pt age.   EMS VS: BP 210/110, HR 90 afib w/ RBBB, CBG 115, 90% RA which came up to 97% on 3L.   18g L AC

## 2022-09-17 ENCOUNTER — Emergency Department (HOSPITAL_COMMUNITY): Payer: PPO

## 2022-09-17 DIAGNOSIS — R4182 Altered mental status, unspecified: Secondary | ICD-10-CM | POA: Diagnosis not present

## 2022-09-17 DIAGNOSIS — I10 Essential (primary) hypertension: Secondary | ICD-10-CM | POA: Diagnosis not present

## 2022-09-17 DIAGNOSIS — K449 Diaphragmatic hernia without obstruction or gangrene: Secondary | ICD-10-CM | POA: Diagnosis not present

## 2022-09-17 LAB — TROPONIN I (HIGH SENSITIVITY)
Troponin I (High Sensitivity): 36 ng/L — ABNORMAL HIGH (ref <18)
Troponin I (High Sensitivity): 32 ng/L — ABNORMAL HIGH (ref <18)

## 2022-09-17 LAB — BRAIN NATRIURETIC PEPTIDE: B Natriuretic Peptide: 210.3 pg/mL — ABNORMAL HIGH (ref 0.0–100.0)

## 2022-09-17 MED ORDER — PANTOPRAZOLE SODIUM 40 MG PO TBEC
40.0000 mg | DELAYED_RELEASE_TABLET | Freq: Every day | ORAL | Status: DC
  Administered 2022-09-17: 40 mg via ORAL
  Filled 2022-09-17: qty 1

## 2022-09-17 MED ORDER — HYDRALAZINE HCL 25 MG PO TABS
25.0000 mg | ORAL_TABLET | Freq: Once | ORAL | Status: AC
  Administered 2022-09-17: 25 mg via ORAL
  Filled 2022-09-17: qty 1

## 2022-09-17 MED ORDER — ACETAMINOPHEN 500 MG PO TABS: 1000.0000 mg | ORAL_TABLET | Freq: Four times a day (QID) | ORAL | Status: DC | PRN

## 2022-09-17 MED ORDER — DILTIAZEM HCL ER COATED BEADS 180 MG PO CP24
180.0000 mg | ORAL_CAPSULE | Freq: Every day | ORAL | Status: DC
  Administered 2022-09-17: 180 mg via ORAL
  Filled 2022-09-17: qty 1

## 2022-09-17 MED ORDER — FLUTICASONE PROPIONATE 50 MCG/ACT NA SUSP: 2.0000 | Freq: Every day | NASAL | Status: DC | PRN

## 2022-09-17 MED ORDER — TRAMADOL HCL 50 MG PO TABS: 50.0000 mg | ORAL_TABLET | Freq: Three times a day (TID) | ORAL | Status: DC | PRN

## 2022-09-17 MED ORDER — FERROUS SULFATE 325 (65 FE) MG PO TABS
325.0000 mg | ORAL_TABLET | Freq: Every day | ORAL | Status: DC
  Administered 2022-09-17: 325 mg via ORAL
  Filled 2022-09-17: qty 1

## 2022-09-17 MED ORDER — FAMOTIDINE 20 MG PO TABS: 20.0000 mg | ORAL_TABLET | Freq: Two times a day (BID) | ORAL | Status: DC | PRN

## 2022-09-17 MED ORDER — CLOPIDOGREL BISULFATE 75 MG PO TABS
75.0000 mg | ORAL_TABLET | Freq: Every day | ORAL | Status: DC
  Administered 2022-09-17: 75 mg via ORAL
  Filled 2022-09-17: qty 1

## 2022-09-17 MED ORDER — DOXAZOSIN MESYLATE 1 MG PO TABS
1.0000 mg | ORAL_TABLET | Freq: Every day | ORAL | Status: DC
  Administered 2022-09-17: 1 mg via ORAL
  Filled 2022-09-17: qty 1

## 2022-09-17 MED ORDER — ROPINIROLE HCL 1 MG PO TABS
0.5000 mg | ORAL_TABLET | Freq: Every day | ORAL | Status: DC
  Administered 2022-09-17: 0.5 mg via ORAL
  Filled 2022-09-17: qty 1

## 2022-09-17 MED ORDER — TAMSULOSIN HCL 0.4 MG PO CAPS
0.4000 mg | ORAL_CAPSULE | Freq: Every day | ORAL | Status: DC
  Administered 2022-09-17: 0.4 mg via ORAL
  Filled 2022-09-17: qty 1

## 2022-09-17 MED ORDER — TRAMADOL HCL 50 MG PO TABS
50.0000 mg | ORAL_TABLET | ORAL | Status: AC
  Administered 2022-09-17: 50 mg via ORAL
  Filled 2022-09-17: qty 1

## 2022-09-17 NOTE — ED Provider Notes (Addendum)
  Provider Note MRN:  [id number]  Arrival date & time: 09/17/22    ED Course and Medical Decision Making  Assumed care from [place]   See note from prior team for complete details, in brief:  89 yo male  History of CVA, hypertension, hyperlipidemia, COPD Initial complaint of weakness, dizziness.  Multiple falls over the past month. Workup in the ER was reassuring patient was signed out pending PT evaluation in the morning Patient was evaluated physical therapy, ambulated well, patient feels comfortable going home does not want stay in the hospital any longer. Discussed with patient's spouse at bedside who is also agreeable to the plan. Family come pick them up and help get them home. They have follow-up with PCP on Tuesday and will complete this appointment PT recommending home PT and walker, TOC consulted; however pt/family does not want home health or walker  Will dc, advised to f/u with pcp if they change their mind about home pt / walker, see PCP on tuesday   The patient improved significantly and was discharged in stable condition. Detailed discussions were had with the patient regarding current findings, and need for close f/u with PCP or on call doctor. The patient has been instructed to return immediately if the symptoms worsen in any way for re-evaluation. Patient verbalized understanding and is in agreement with current care plan. All questions answered prior to discharge.      Procedures  Final Clinical Impressions(s) / ED Diagnoses     ICD-10-CM   1. Generalized weakness  R53.1     2. Secondary hypertension  I15.9     3. Dizziness  R42       ED Discharge Orders     None         Discharge Instructions      Please follow-up with your PCP in regards to elevated blood pressure reading  Please follow-up with your PCP on Tuesday as scheduled  It was a pleasure caring for you today in the emergency department.  Please return to the emergency department for any  worsening or worrisome symptoms.          Jeanell Sparrow, DO 09/17/22 1133    Jeanell Sparrow, DO 09/17/22 1148

## 2022-09-17 NOTE — Evaluation (Signed)
Physical Therapy Evaluation/ Discharge Patient Details Name: [name] MRN: [id number] DOB: 1926/07/17 Today's Date: 09/17/2022  History of Present Illness  89 yo male admitted 1/26 from home after fall with AMS, HTN, weakness and new Rt BBB. PMHx: recent covid grossly 1 mo PTA, HTN, HLD, COPD, crytogenic CVA, Von Willebrand's disease  Clinical Impression  Pt pleasant and reports improved cognition from admission. Pt reports caring for himself with wife available at home and family providing assist with transportation. Pt able to walk in hall without assist with noted balance deficits and would benefit from RW and OPPT to further assess balance training. Pt has available assist at home and without significant change in baseline function does not require further acute therapy. Will sign off.        Recommendations for follow up therapy are one component of a multi-disciplinary discharge planning process, led by the attending physician.  Recommendations may be updated based on patient status, additional functional criteria and insurance authorization.  Follow Up Recommendations Outpatient PT      Assistance Recommended at Discharge Intermittent Supervision/Assistance  Patient can return home with the following  Assistance with cooking/housework;Assist for transportation    Equipment Recommendations Rolling walker (2 wheels)  Recommendations for Other Services       Functional Status Assessment Patient has not had a recent decline in their functional status     Precautions / Restrictions Precautions Precautions: Fall      Mobility  Bed Mobility Overal bed mobility: Modified Independent                  Transfers Overall transfer level: Needs assistance   Transfers: Sit to/from Stand Sit to Stand: Supervision                Ambulation/Gait Ambulation/Gait assistance: Supervision Gait Distance (Feet): 250 Feet   Gait Pattern/deviations: Step-through  pattern, Decreased stride length   Gait velocity interpretation: >2.62 ft/sec, indicative of community ambulatory   General Gait Details: 1 partial LOB with gait with straight hall ambulation, and 1 partial LOB when turning to toilet  Stairs            Wheelchair Mobility    Modified Rankin (Stroke Patients Only)       Balance Overall balance assessment: Mild deficits observed, not formally tested, History of Falls                                           Pertinent Vitals/Pain Pain Assessment Pain Assessment: No/denies pain    Home Living Family/patient expects to be discharged to:: Private residence Living Arrangements: Spouse/significant other Available Help at Discharge: Family;Available PRN/intermittently Type of Home: House Home Access: Level entry       Home Layout: One level Home Equipment: Shower seat - built in      Prior Function Prior Level of Function : Independent/Modified Independent                     Journalist, newspaper        Extremity/Trunk Assessment   Upper Extremity Assessment Upper Extremity Assessment: Generalized weakness    Lower Extremity Assessment Lower Extremity Assessment: Generalized weakness    Cervical / Trunk Assessment Cervical / Trunk Assessment: Normal  Communication   Communication: HOH  Cognition Arousal/Alertness: Awake/alert Behavior During Therapy: WFL for tasks assessed/performed Overall Cognitive Status: Within Functional Limits  for tasks assessed                                          General Comments      Exercises     Assessment/Plan    PT Assessment All further PT needs can be met in the next venue of care  PT Problem List Decreased balance;Decreased activity tolerance;Decreased safety awareness       PT Treatment Interventions      PT Goals (Current goals can be found in the Care Plan section)  Acute Rehab PT Goals PT Goal Formulation: All  assessment and education complete, DC therapy    Frequency       Co-evaluation               AM-PAC PT "6 Clicks" Mobility  Outcome Measure Help needed turning from your back to your side while in a flat bed without using bedrails?: None Help needed moving from lying on your back to sitting on the side of a flat bed without using bedrails?: None Help needed moving to and from a bed to a chair (including a wheelchair)?: A Little Help needed standing up from a chair using your arms (e.g., wheelchair or bedside chair)?: A Little Help needed to walk in hospital room?: A Little Help needed climbing 3-5 steps with a railing? : A Little 6 Click Score: 20    End of Session Equipment Utilized During Treatment: Gait belt Activity Tolerance: Patient tolerated treatment well Patient left: in bed;with call bell/phone within reach Nurse Communication: Mobility status PT Visit Diagnosis: Other abnormalities of gait and mobility (R26.89)    Time: 9211-9417 PT Time Calculation (min) (ACUTE ONLY): 26 min   Charges:   PT Evaluation $PT Eval Moderate Complexity: 1 Mod PT Treatments $Therapeutic Activity: 8-22 mins        [name], PT Acute Rehabilitation Services Office: [place] [facility] Malaisha Silliman 09/17/2022, 11:39 AM

## 2022-09-17 NOTE — TOC Initial Note (Signed)
Transition of Care [facility]) - Initial/Assessment Note    Patient Details  Name: [name] MRN: [id number] Date of Birth: 1926/03/02  [facility] CM/SW Contact:    Verdell Carmine, RN Phone Number: 09/17/2022, 11:48 AM  Clinical Narrative:                    Patient lives with wife at home. Introduced role and explained HH and walker. At this time he is declining both. They cite both that he doesn't think it is needed and financial concerns about co-pays. Told him I could send it in and they can call about any co-pays, there may be none, however patient still declines. He has a follow up with his primary and will see them Tuesday. Passed message to provider, team     Patient Goals and CMS Choice            Expected Discharge Plan and Services                                              Prior Living Arrangements/Services                       Activities of Daily Living      Permission Sought/Granted                  Emotional Assessment              Admission diagnosis:  Weakness; AFIB Patient Active Problem List   Diagnosis Date Noted   COVID 08/21/2022   Aortic atherosclerosis ([place]) 40/98/1191   Complicated UTI (urinary tract infection) 09/20/2020   Leukocytosis 09/20/2020   Generalized weakness 09/20/2020   Healthcare maintenance 01/27/2020   Unsteady 01/20/2019   Chest wall pain 05/27/2018   SI (sacroiliac) pain 03/26/2018   Testicle pain 08/06/2017   Dysuria 07/04/2017   Coronary artery disease involving native coronary artery of native heart without angina pectoris 05/04/2017   Prostatitis 03/28/2017   Cough 02/03/2017   Joint pain 03/30/2016   Lower back pain 03/30/2016   Tennis elbow 03/30/2016   Iron deficiency 03/07/2016   Restless leg syndrome 03/04/2016   Atypical chest pain 02/26/2016   Leg pain 01/28/2016   Vitamin B 12 deficiency 01/28/2016   Knee pain 12/06/2015   Fatigue 12/06/2015    Vertigo 12/06/2015   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/27/2015   Palpitations 04/03/2015   Hyperlipidemia LDL goal <70 04/03/2015   Cerebral infarction due to stenosis of cerebral artery ([place]) 04/03/2015   History of stroke    Advance care planning 08/21/2014   Memory change 08/21/2014   Acute diverticulitis 08/16/2014   Ectatic abdominal aorta ([place]) 08/16/2014   Vomiting    COPD exacerbation ([facility])    Abdominal pain, lower    GERD (gastroesophageal reflux disease) 05/29/2014   Left flank pain, chronic 01/22/2013   Hypokalemia 12/28/2012   Angioedema of lips 12/28/2012   Medicare annual wellness visit, subsequent 08/15/2011   Angiodysplasia of intestine 11/30/2009   Hearing loss 07/27/2009   DYSPHAGIA 08/21/2008   Barrett's esophagus 05/16/2008   TEMPOROMANDIBULAR JOINT DISORDER 47/82/9562   UMBILICAL HERNIA 13/03/6577   Essential hypertension 05/15/2007   HEMORRHOIDS 05/15/2007   PCP:  Tonia Ghent, MD Pharmacy:   [id number] [place] [place] - [address]  [place] [address] Phone: [phone] Fax: [phone]     Social Determinants of Health (SDOH) Social History: SDOH Screenings   Food Insecurity: No Food Insecurity (01/31/2022)  Housing: Low Risk  (01/31/2022)  Transportation Needs: No Transportation Needs (01/31/2022)  Alcohol Screen: Low Risk  (01/31/2022)  Depression (PHQ2-9): Low Risk  (03/31/2022)  Financial Resource Strain: Low Risk  (01/31/2022)  Physical Activity: Sufficiently Active (01/31/2022)  Social Connections: Moderately Isolated (01/31/2022)  Stress: No Stress Concern Present (01/31/2022)  Tobacco Use: Medium Risk (09/16/2022)   SDOH Interventions:     Readmission Risk Interventions     No data to display

## 2022-09-17 NOTE — ED Notes (Signed)
Patient ambulatory in hallway with PT. No complaints. No signs of distress.

## 2022-09-17 NOTE — ED Provider Notes (Signed)
Informed by nursing that patient was awaiting a PT eval in AM but troponin went from 16 to 36. Appears he is here for hypertension and weakness.  His troponin went from 16-36 with a new right bundle branch on his EKG.  Had MRI and CT of his head which were both relatively reassuring.  His blood pressures are consistently high while here which very well could account for his elevated troponin however we will check a third.  If this is relatively stable it is likely demand from his hypotension rather than true ACS event.  Will repeat a EKG and get a BNP and chest x-ray as well as these could be possible etiologies for his generalized weakness.  Vitals:   09/17/22 0500 09/17/22 0515  BP: (!) 160/92 (!) 153/80  Pulse: 66 68  Resp: 15 15  Temp:    SpO2: 93% 94%    Labs Reviewed  COMPREHENSIVE METABOLIC PANEL - Abnormal; Notable for the following components:      Result Value   Glucose, Bld 106 (*)    Calcium 8.5 (*)    Total Protein 6.4 (*)    Albumin 3.3 (*)    All other components within normal limits  URINALYSIS, ROUTINE W REFLEX MICROSCOPIC - Abnormal; Notable for the following components:   Ketones, ur 5 (*)    All other components within normal limits  BRAIN NATRIURETIC PEPTIDE - Abnormal; Notable for the following components:   B Natriuretic Peptide 210.3 (*)    All other components within normal limits  CBG MONITORING, ED - Abnormal; Notable for the following components:   Glucose-Capillary 106 (*)    All other components within normal limits  TROPONIN I (HIGH SENSITIVITY) - Abnormal; Notable for the following components:   Troponin I (High Sensitivity) 36 (*)    All other components within normal limits  TROPONIN I (HIGH SENSITIVITY) - Abnormal; Notable for the following components:   Troponin I (High Sensitivity) 32 (*)    All other components within normal limits  CBC  TROPONIN I (HIGH SENSITIVITY)     Second troponin decreased. Doubt ACS, likely secondary to HTN. CXR  with malpositioning and possible widened mediastinum but equal pulses. Neurologically intact. Ecg unchanged. BP improved with extra does of meds. Patient without other complaints other than being woken from sleep. Patient can continue with initial plan from other physician.    Zilla Shartzer, Corene Cornea, MD 09/17/22 [phone]

## 2022-09-17 NOTE — Discharge Instructions (Addendum)
Please follow-up with your PCP in regards to elevated blood pressure reading  Please follow-up with your PCP on Tuesday as scheduled  It was a pleasure caring for you today in the emergency department.  Please return to the emergency department for any worsening or worrisome symptoms.

## 2022-09-17 NOTE — ED Notes (Signed)
Patient's wife left patient's hearing aids at bedside.

## 2022-09-18 ENCOUNTER — Telehealth: Payer: Self-pay | Admitting: Family Medicine

## 2022-09-18 NOTE — Telephone Encounter (Signed)
Please talk to me about this patient before calling.   Please get update on patient.  His wife is currently inpatient- inpatient team has been sending me updates.   He is scheduled to come in Tuesday but see if he needs to change that given his wife's situation.

## 2022-09-19 NOTE — Telephone Encounter (Signed)
Patient rescheduled appt for 09/29/22

## 2022-09-20 ENCOUNTER — Ambulatory Visit: Payer: PPO | Admitting: Family Medicine

## 2022-09-29 ENCOUNTER — Encounter: Payer: Self-pay | Admitting: Family Medicine

## 2022-09-29 ENCOUNTER — Ambulatory Visit (INDEPENDENT_AMBULATORY_CARE_PROVIDER_SITE_OTHER): Payer: PPO | Admitting: Family Medicine

## 2022-09-29 VITALS — BP 144/80 | HR 85 | Temp 97.7°F | Ht 66.0 in | Wt 149.0 lb

## 2022-09-29 DIAGNOSIS — I10 Essential (primary) hypertension: Secondary | ICD-10-CM

## 2022-09-29 NOTE — Patient Instructions (Addendum)
Don't change your meds for now and update me as needed.  Take care.  Glad to see you. 

## 2022-09-29 NOTE — Progress Notes (Signed)
Wife with recent NSTEMI, now back home. D/w pt.   ER eval d/w pt.  He thought he had missed his BP meds and that could have led to ER eval.  His BP was elevated at ER, improved today.  No CP.  He can walk around the house but has some fatigue in his legs.  He can walk about 1/4 mile at a time.  Sleeping well except for last night.    Back on baseline BP meds in the meantime.  Not lightheaded on standing.  He clearly feels better than compared to presentation to ER.   Meds, vitals, and allergies reviewed.   ROS: Per HPI unless specifically indicated in ROS section   GEN: nad, alert and oriented HEENT: ncat NECK: supple w/o LA CV: rrr. PULM: ctab, no inc wob ABD: soft, +bs EXT: no edema SKIN: well perfused.

## 2022-10-02 NOTE — Assessment & Plan Note (Signed)
Recent stressors discussed with patient, especially his wife being in the hospital.  Would continue diltiazem doxazosin as is for now since his blood pressure has improved in the meantime.  He will update me as needed.  Routine cautions given to patient.  He agrees to plan.

## 2022-10-05 ENCOUNTER — Other Ambulatory Visit (HOSPITAL_COMMUNITY): Payer: Self-pay

## 2022-10-05 NOTE — Telephone Encounter (Signed)
Placed a call to [employer] at [phone], representative stated there was a paid claim on 09/09/2022 and a prior auth was not required for the Tramadol 30m.

## 2022-10-10 ENCOUNTER — Other Ambulatory Visit: Payer: Self-pay | Admitting: Family Medicine

## 2022-10-28 ENCOUNTER — Encounter (HOSPITAL_COMMUNITY): Payer: Self-pay

## 2022-10-28 ENCOUNTER — Emergency Department (HOSPITAL_COMMUNITY): Payer: PPO

## 2022-10-28 ENCOUNTER — Emergency Department (HOSPITAL_COMMUNITY)
Admission: EM | Admit: 2022-10-28 | Discharge: 2022-10-28 | Disposition: A | Discharge status: Home / Self Care | Payer: PPO | Attending: Emergency Medicine | Admitting: Emergency Medicine

## 2022-10-28 ENCOUNTER — Other Ambulatory Visit: Payer: Self-pay

## 2022-10-28 ENCOUNTER — Encounter: Payer: Self-pay | Admitting: Nurse Practitioner

## 2022-10-28 ENCOUNTER — Ambulatory Visit (INDEPENDENT_AMBULATORY_CARE_PROVIDER_SITE_OTHER): Payer: PPO | Admitting: Nurse Practitioner

## 2022-10-28 VITALS — BP 194/76 | HR 82 | Temp 99.4°F | Resp 16 | Ht 66.0 in | Wt 150.2 lb

## 2022-10-28 DIAGNOSIS — Z1152 Encounter for screening for COVID-19: Secondary | ICD-10-CM | POA: Insufficient documentation

## 2022-10-28 DIAGNOSIS — I160 Hypertensive urgency: Secondary | ICD-10-CM

## 2022-10-28 DIAGNOSIS — R519 Headache, unspecified: Secondary | ICD-10-CM | POA: Diagnosis not present

## 2022-10-28 DIAGNOSIS — I2510 Atherosclerotic heart disease of native coronary artery without angina pectoris: Secondary | ICD-10-CM | POA: Diagnosis not present

## 2022-10-28 DIAGNOSIS — J449 Chronic obstructive pulmonary disease, unspecified: Secondary | ICD-10-CM | POA: Diagnosis not present

## 2022-10-28 DIAGNOSIS — K449 Diaphragmatic hernia without obstruction or gangrene: Secondary | ICD-10-CM | POA: Diagnosis not present

## 2022-10-28 DIAGNOSIS — R531 Weakness: Secondary | ICD-10-CM | POA: Insufficient documentation

## 2022-10-28 DIAGNOSIS — R109 Unspecified abdominal pain: Secondary | ICD-10-CM | POA: Diagnosis not present

## 2022-10-28 DIAGNOSIS — H5702 Anisocoria: Secondary | ICD-10-CM

## 2022-10-28 DIAGNOSIS — R42 Dizziness and giddiness: Secondary | ICD-10-CM | POA: Diagnosis not present

## 2022-10-28 DIAGNOSIS — G4489 Other headache syndrome: Secondary | ICD-10-CM | POA: Diagnosis not present

## 2022-10-28 DIAGNOSIS — I10 Essential (primary) hypertension: Secondary | ICD-10-CM | POA: Diagnosis not present

## 2022-10-28 DIAGNOSIS — I6381 Other cerebral infarction due to occlusion or stenosis of small artery: Secondary | ICD-10-CM | POA: Diagnosis not present

## 2022-10-28 LAB — RESP PANEL BY RT-PCR (RSV, FLU A&B, COVID)  RVPGX2
Influenza A by PCR: NEGATIVE
Influenza B by PCR: NEGATIVE
Resp Syncytial Virus by PCR: NEGATIVE
SARS Coronavirus 2 by RT PCR: NEGATIVE

## 2022-10-28 LAB — BASIC METABOLIC PANEL
Anion gap: 12 (ref 5–15)
BUN: 11 mg/dL (ref 8–23)
CO2: 21 mmol/L — ABNORMAL LOW (ref 22–32)
Calcium: 8.8 mg/dL — ABNORMAL LOW (ref 8.9–10.3)
Chloride: 101 mmol/L (ref 98–111)
Creatinine, Ser: 0.94 mg/dL (ref 0.61–1.24)
GFR, Estimated: >60 mL/min (ref >60)
Glucose, Bld: 102 mg/dL — ABNORMAL HIGH (ref 70–99)
Potassium: 4.4 mmol/L (ref 3.5–5.1)
Sodium: 134 mmol/L — ABNORMAL LOW (ref 135–145)

## 2022-10-28 LAB — POC URINALSYSI DIPSTICK (AUTOMATED)
Bilirubin, UA: NEGATIVE
Blood, UA: NEGATIVE
Glucose, UA: NEGATIVE
Ketones, UA: NEGATIVE
Leukocytes, UA: NEGATIVE
Nitrite, UA: NEGATIVE
Protein, UA: NEGATIVE
Spec Grav, UA: 1.015 (ref 1.010–1.025)
Urobilinogen, UA: 0.2 E.U./dL
pH, UA: 6 (ref 5.0–8.0)

## 2022-10-28 LAB — CBC WITH DIFFERENTIAL/PLATELET
Abs Immature Granulocytes: 0.02 10*3/uL (ref 0.00–0.07)
Basophils Absolute: 0 10*3/uL (ref 0.0–0.1)
Basophils Relative: 1 %
Eosinophils Absolute: 0.1 10*3/uL (ref 0.0–0.5)
Eosinophils Relative: 1 %
HCT: 44.2 % (ref 39.0–52.0)
Hemoglobin: 14.4 g/dL (ref 13.0–17.0)
Immature Granulocytes: 0 %
Lymphocytes Relative: 17 %
Lymphs Abs: 1.4 10*3/uL (ref 0.7–4.0)
MCH: 29.9 pg (ref 26.0–34.0)
MCHC: 32.6 g/dL (ref 30.0–36.0)
MCV: 91.7 fL (ref 80.0–100.0)
Monocytes Absolute: 0.6 10*3/uL (ref 0.1–1.0)
Monocytes Relative: 7 %
Neutro Abs: 6.1 10*3/uL (ref 1.7–7.7)
Neutrophils Relative %: 74 %
Platelets: 247 10*3/uL (ref 150–400)
RBC: 4.82 MIL/uL (ref 4.22–5.81)
RDW: 13 % (ref 11.5–15.5)
WBC: 8.2 10*3/uL (ref 4.0–10.5)
nRBC: 0 % (ref 0.0–0.2)

## 2022-10-28 LAB — URINALYSIS, ROUTINE W REFLEX MICROSCOPIC
Bilirubin Urine: NEGATIVE
Glucose, UA: NEGATIVE mg/dL
Hgb urine dipstick: NEGATIVE
Ketones, ur: NEGATIVE mg/dL
Leukocytes,Ua: NEGATIVE
Nitrite: NEGATIVE
Protein, ur: NEGATIVE mg/dL
Specific Gravity, Urine: 1.006 (ref 1.005–1.030)
pH: 6 (ref 5.0–8.0)

## 2022-10-28 LAB — TROPONIN I (HIGH SENSITIVITY): Troponin I (High Sensitivity): 9 ng/L (ref <18)

## 2022-10-28 MED ORDER — LACTATED RINGERS IV SOLN: INTRAVENOUS | Status: DC

## 2022-10-28 MED ORDER — DOXAZOSIN MESYLATE 1 MG PO TABS
1.0000 mg | ORAL_TABLET | Freq: Once | ORAL | Status: AC
  Administered 2022-10-28: 1 mg via ORAL
  Filled 2022-10-28: qty 1

## 2022-10-28 MED ORDER — TETRACAINE HCL 0.5 % OP SOLN
1.0000 [drp] | Freq: Once | OPHTHALMIC | Status: AC
  Administered 2022-10-28: 1 [drp] via OPHTHALMIC
  Filled 2022-10-28: qty 4

## 2022-10-28 MED ORDER — ACETAMINOPHEN 500 MG PO TABS
1000.0000 mg | ORAL_TABLET | Freq: Once | ORAL | Status: AC
  Administered 2022-10-28: 1000 mg via ORAL
  Filled 2022-10-28: qty 2

## 2022-10-28 NOTE — ED Triage Notes (Signed)
Pt BIB EMS from [facility]. Per EMS, pt has had headache and dizziness for 3 days. Pt has hx of HTN. A/Ox4

## 2022-10-28 NOTE — Care Management (Addendum)
ED RNCM met with patient and son [name] [id number]  at bedside.  He reports that patient has c/o of headache dizziness and weakness for several days. Patient lives at home with spouse care taker who recently was recently discharge from the hospital after a heart attack.  Discussed [facility] services and how patient would potentially benefit from the services. Son is agreeable.  Patient is active with [employer], RNCM will send referral to [facility] agency which is within [employer] network.  ED RNCM will follow up Monday 3/10

## 2022-10-28 NOTE — Assessment & Plan Note (Signed)
Patient with history of strokes and elevated blood pressure with headache and dizziness patient was referred to emergency department for further evaluation

## 2022-10-28 NOTE — Progress Notes (Signed)
Acute Office Visit  Subjective:     Patient ID: [name], male    DOB: 01-Oct-1925, 89 y.o.   MRN: [id number]  Chief Complaint  Patient presents with   Flank Pain    Under ribs on the right side 2-3 weeks ago     Patient is in today for flank pain with a history of HENt, estatic abdominal aorta, CAD, COPD, ERD  Headache: states that over the past 2 days. States that he wakes up with it and sometimes he it will come on after he gets ups. States it is that it is a dul pain that is not pulsing States that he did try tylenol that did not help   States that he is sure that he took his bp medication today   Symptoms started approx 2-3 weeks ago  It comes and goes. States it is intermittent and quikc and it goes away. States that that his bm was yesterday.   Review of Systems  Constitutional:  Negative for chills and fever.  Eyes:  Negative for blurred vision and double vision.  Respiratory:  Negative for shortness of breath.   Cardiovascular:  Negative for chest pain.  Gastrointestinal:  Positive for abdominal pain. Negative for diarrhea, nausea and vomiting.  Genitourinary:  Positive for flank pain.  Neurological:  Positive for dizziness and headaches.  Psychiatric/Behavioral:  Negative for hallucinations and suicidal ideas.         Objective:    BP (!) 194/76   Pulse 82   Temp 99.4 F (37.4 C)   Resp 16   Ht '5\' 6"'$  (1.676 m)   Wt 150 lb 4 oz (68.2 kg)   SpO2 98%   BMI 24.25 kg/m  BP Readings from Last 3 Encounters:  10/28/22 (!) 194/76  09/29/22 (!) 144/80  09/17/22 (!) 179/89   Wt Readings from Last 3 Encounters:  10/28/22 150 lb 4 oz (68.2 kg)  09/29/22 149 lb (67.6 kg)  09/16/22 157 lb (71.2 kg)      Physical Exam Vitals and nursing note reviewed.  Constitutional:      Appearance: Normal appearance.  HENT:     Right Ear: Tympanic membrane, ear canal and external ear normal.     Left Ear: Tympanic membrane, ear canal and external ear normal.      Mouth/Throat:     Mouth: Mucous membranes are moist.     Pharynx: Oropharynx is clear.  Eyes:     Comments: Left pupil non reactive and larger than right  Cardiovascular:     Rate and Rhythm: Normal rate and regular rhythm.     Heart sounds: Normal heart sounds.  Pulmonary:     Effort: Pulmonary effort is normal.     Breath sounds: Normal breath sounds.  Musculoskeletal:     Right lower leg: No edema.     Left lower leg: No edema.  Lymphadenopathy:     Cervical: No cervical adenopathy.  Neurological:     General: No focal deficit present.     Mental Status: He is alert.     Deep Tendon Reflexes:     Reflex Scores:      Bicep reflexes are 2+ on the right side and 2+ on the left side.      Patellar reflexes are 2+ on the right side and 2+ on the left side.    Comments: Bilateral upper and lower extremity strength 5/5     Results for orders placed or performed in  visit on 10/28/22  POCT Urinalysis Dipstick (Automated)  Result Value Ref Range   Color, UA Yellow    Clarity, UA clear    Glucose, UA Negative Negative   Bilirubin, UA Negative    Ketones, UA Negative    Spec Grav, UA 1.015 1.010 - 1.025   Blood, UA Negative    pH, UA 6.0 5.0 - 8.0   Protein, UA Negative Negative   Urobilinogen, UA 0.2 0.2 or 1.0 E.U./dL   Nitrite, UA Negative    Leukocytes, UA Negative Negative        Assessment & Plan:   Problem List Items Addressed This Visit       Cardiovascular and Mediastinum   Hypertensive urgency    Patient with markedly elevated blood pressure in office.  He is having symptoms of headaches and dizziness.  Patient did take his Cardizem today per his report.  Patient does have unequal pupils left foot is larger and nonreactive.  Patient was directed to the emergency department for further workup symptoms started approximately 2 days ago        Other   Flank pain - Primary    Intermittent pain to the right lower abdomen/flank.  Exam was entirely due to patient  being in the emergency department for emergent evaluation and imaging of head.      Relevant Orders   POCT Urinalysis Dipstick (Automated) (Completed)   Unequal pupils    Patient with history of strokes and elevated blood pressure with headache and dizziness patient was referred to emergency department for further evaluation       No orders of the defined types were placed in this encounter.   No follow-ups on file.  Romilda Garret, NP

## 2022-10-28 NOTE — Assessment & Plan Note (Signed)
Patient with markedly elevated blood pressure in office.  He is having symptoms of headaches and dizziness.  Patient did take his Cardizem today per his report.  Patient does have unequal pupils left foot is larger and nonreactive.  Patient was directed to the emergency department for further workup symptoms started approximately 2 days ago

## 2022-10-28 NOTE — Discharge Instructions (Addendum)
All the blood work and the CAT scan of your brain looks good today.  However your blood pressure has been elevated and some of that may be related to the new stress and not eating your typical diet.  I want you to continue taking your current blood pressure medication and start checking your blood pressure once a day and writing down the results.  Take your blood pressure approximately 1 hour after you take your morning medication.  If the blood pressure remains 160/90 or higher you can take 1 extra dose of cardura (doxazosin) so that would be a total of 2 mg instead of 1 mg.  Continue to take the evening dose of 1 mg.  If you start having severe headache, weakness on one side of your body, confusion, vomiting or loss of vision you should return to the emergency room immediately.

## 2022-10-28 NOTE — Assessment & Plan Note (Signed)
Intermittent pain to the right lower abdomen/flank.  Exam was entirely due to patient being in the emergency department for emergent evaluation and imaging of head.

## 2022-10-28 NOTE — ED Notes (Signed)
Patient transported to CT 

## 2022-10-28 NOTE — ED Provider Notes (Signed)
[facility] Provider Note   CSN: [id number] Arrival date & time: 10/28/22  1615     History  Chief Complaint  Patient presents with   Headache    [name] is a 89 y.o. male.  Pt is a 89y/o male with hx of HTN, estatic abdominal aorta, CAD, COPD, gastric AVM, PUD who is presenting today from urgent care due to a headache for the last 3 to 4 days, feeling intermittently unwell, generalized weakness and intermittent poor appetite.  Patient reports now he feels much better and thinks that he received something urgent care to make his blood pressure better.  However patient was not given any medications so is spontaneously feeling better than he was.  He denies any cough, shortness of breath or known fevers.  He had complained to urgent care of 2 to 3 weeks of intermittent right flank pain but denies any abdominal pain, back pain or rib pain currently.  He denies any focal weakness in arms or legs but reports he just feels generally unwell.  He has been compliant with his blood pressure medications that have not changed recently and is taken 2 doses of blood pressure medication today.  He constantly is getting shots in his eyes for his macular degeneration and is not sure if 1 pupil is usually bigger or smaller than the other.  He has not had any falls or head injuries recently.  The history is provided by the patient, the EMS personnel and medical records.  Headache      Home Medications Prior to Admission medications   Medication Sig Start Date End Date Taking? Authorizing Provider  acetaminophen (TYLENOL) 500 MG tablet Take 1,000 mg by mouth every 6 (six) hours as needed for headache (pain).    [provider]  AFLIBERCEPT IO Inject into the eye See admin instructions. Right eye injection every 4 weeks    [provider]  Alirocumab (PRALUENT) 75 MG/ML SOAJ INJECT 75 MG INTO THE SKIN EVERY 14 (FOURTEEN) DAYS. 03/04/22    Jettie Booze, MD  Bevacizumab (AVASTIN IV) Place into the left eye See admin instructions. Left eye injection every 8 weeks    [provider]  cetirizine (ZYRTEC) 10 MG tablet Take 10 mg by mouth daily as needed for allergies.    [provider]  clopidogrel (PLAVIX) 75 MG tablet TAKE 1 TABLET BY MOUTH EVERY DAY 10/10/22   Tonia Ghent, MD  diltiazem (CARDIZEM CD) 180 MG 24 hr capsule TAKE 1 CAPSULE BY MOUTH EVERY DAY 08/12/22   Tonia Ghent, MD  doxazosin (CARDURA) 1 MG tablet Take 1 tablet (1 mg total) by mouth 2 (two) times daily. 01/03/22   Tonia Ghent, MD  famotidine (PEPCID) 20 MG tablet TAKE 1 TABLET (20 MG TOTAL) BY MOUTH 2 (TWO) TIMES DAILY AS NEEDED FOR HEARTBURN OR INDIGESTION. 12/16/21   Tonia Ghent, MD  ferrous sulfate 325 (65 FE) MG tablet Take 1 tablet (325 mg total) by mouth daily with breakfast. 04/05/17   Tonia Ghent, MD  fluticasone [facility]) 50 MCG/ACT nasal spray Place 2 sprays into both nostrils daily as needed for allergies. 08/21/15   Tonia Ghent, MD  loperamide (IMODIUM) 2 MG capsule Take 1 capsule (2 mg total) by mouth as needed for diarrhea or loose stools. 09/22/20   Lavina Hamman, MD  Multiple Vitamins-Minerals (OCUVITE ADULT 50+ PO) Take 1 capsule by mouth daily with lunch.  [provider]  nitroGLYCERIN (NITROSTAT) 0.4 MG SL tablet Place 1 tablet (0.4 mg total) under the tongue every 5 (five) minutes as needed for chest pain (max 3 doses in 15 minutes). 03/26/18   Venia Carbon, MD  pantoprazole (PROTONIX) 40 MG tablet TAKE 1 TABLET BY MOUTH EVERY DAY IN THE MORNING 07/11/22   Tonia Ghent, MD  Polyethyl Glycol-Propyl Glycol (SYSTANE) 0.4-0.3 % SOLN Place 1 drop into both eyes 3 (three) times daily as needed (dry eyes).    [provider]  rOPINIRole (REQUIP) 0.5 MG tablet TAKE 1 TABLET BY MOUTH EVERYDAY AT BEDTIME 07/11/22   Tonia Ghent, MD  tamsulosin (FLOMAX) 0.4 MG CAPS capsule TAKE 1  CAPSULE BY MOUTH EVERY DAY 02/19/19   Tonia Ghent, MD  traMADol (ULTRAM) 50 MG tablet Take 1 tablet (50 mg total) by mouth every 8 (eight) hours as needed. for pain 09/09/22   Tonia Ghent, MD  vitamin B-12 (CYANOCOBALAMIN) 1000 MCG tablet Take 2 tablets (2,000 mcg total) by mouth daily. 01/28/16   Tonia Ghent, MD      Allergies    Angiotensin receptor blockers, Atorvastatin, Codeine, Lisinopril, Statins, and Gabapentin    Review of Systems   Review of Systems  Neurological:  Positive for headaches.    Physical Exam Updated Vital Signs BP (!) 160/103   Pulse 74   Temp 97.7 F (36.5 C) (Oral)   Resp 12   SpO2 95%  Physical Exam Vitals and nursing note reviewed.  Constitutional:      General: He is not in acute distress.    Appearance: He is well-developed.     Comments: Pleasant, smiling, answering all questions without difficulty  HENT:     Head: Normocephalic and atraumatic.  Eyes:     Conjunctiva/sclera: Conjunctivae normal.     Comments: Right pupil is 2 mm and minimally reactive.  Right pupil is discolored compared to the left.  Left pupil is 4 mm and sluggishly reactive  Cardiovascular:     Rate and Rhythm: Normal rate and regular rhythm.     Heart sounds: No murmur heard. Pulmonary:     Effort: Pulmonary effort is normal. No respiratory distress.     Breath sounds: Normal breath sounds. No wheezing or rales.  Abdominal:     General: There is no distension.     Palpations: Abdomen is soft.     Tenderness: There is no abdominal tenderness. There is no guarding or rebound.  Musculoskeletal:        General: No tenderness. Normal range of motion.     Cervical back: Normal range of motion and neck supple.     Right lower leg: No edema.     Left lower leg: No edema.  Skin:    General: Skin is warm and dry.     Findings: No erythema or rash.  Neurological:     Mental Status: He is alert and oriented to person, place, and time. Mental status is at baseline.      Cranial Nerves: No dysarthria or facial asymmetry.     Sensory: Sensation is intact. No sensory deficit.     Motor: No weakness, tremor or pronator drift.     Coordination: Coordination is intact.  Psychiatric:        Behavior: Behavior normal.     ED Results / Procedures / Treatments   Labs (all labs ordered are listed, but only abnormal results are displayed) Labs Reviewed  BASIC  METABOLIC PANEL - Abnormal; Notable for the following components:      Result Value   Sodium 134 (*)    CO2 21 (*)    Glucose, Bld 102 (*)    Calcium 8.8 (*)    All other components within normal limits  URINALYSIS, ROUTINE W REFLEX MICROSCOPIC - Abnormal; Notable for the following components:   Color, Urine STRAW (*)    All other components within normal limits  RESP PANEL BY RT-PCR (RSV, FLU A&B, COVID)  RVPGX2  CBC WITH DIFFERENTIAL/PLATELET  TROPONIN I (HIGH SENSITIVITY)    EKG EKG Interpretation  Date/Time:  Friday October 28 2022 17:02:24 EST Ventricular Rate:  73 PR Interval:  175 QRS Duration: 147 QT Interval:  434 QTC Calculation: 479 R Axis:   69 Text Interpretation: Sinus rhythm Right bundle branch block No significant change since last tracing Confirmed by Blanchie Dessert [id number]) on 10/28/2022 5:05:36 PM  Radiology CT Head Wo Contrast  Result Date: 10/28/2022 CLINICAL DATA:  89 year old male with acute headache. EXAM: CT HEAD WITHOUT CONTRAST TECHNIQUE: Contiguous axial images were obtained from the base of the skull through the vertex without intravenous contrast. RADIATION DOSE REDUCTION: This exam was performed according to the departmental dose-optimization program which includes automated exposure control, adjustment of the mA and/or kV according to patient size and/or use of iterative reconstruction technique. COMPARISON:  09/16/2022 MR, CT and prior studies FINDINGS: Brain: No evidence of acute infarction, hemorrhage, hydrocephalus, extra-axial collection or mass lesion/mass  effect. Atrophy, remote white matter and RIGHT thalamic lacunar infarcts, and chronic small-vessel white matter ischemic changes again noted. Vascular: Carotid and vertebral atherosclerotic calcifications are noted. Skull: Normal. Negative for fracture or focal lesion. Sinuses/Orbits: A LEFT mastoid effusion and fluid in the LEFT middle ear again noted. Other: None. IMPRESSION: 1. No evidence of acute intracranial abnormality. 2. Atrophy, remote white matter and RIGHT thalamic lacunar infarcts, and chronic small-vessel white matter ischemic changes. 3. Unchanged LEFT mastoid effusion and LEFT middle ear fluid. Correlate clinically. Electronically Signed   By: Margarette Canada M.D.   On: 10/28/2022 18:36   DG Chest Port 1 View  Result Date: 10/28/2022 CLINICAL DATA:  Headache and weakness. Dizziness. EXAM: PORTABLE CHEST 1 VIEW COMPARISON:  Radiograph 09/17/2022. CT 04/06/2016 FINDINGS: The heart is stable in size. Retrocardiac hiatal hernia. Nodule left hilar soft tissue prominence. Atherosclerosis of the thoracic aorta. Left chest wall loop recorder. Lower lobe bronchial thickening. Faint reticular changes in the periphery of the left lower lobe. Calcified granuloma in the left lung. No pleural fluid or pneumothorax. IMPRESSION: 1. Faint reticular changes in the periphery of the left lower lobe, may represent pneumonitis. 2. Left hilar prominence. On this may be related to overlapping vascular structures given differences in technique, however recommend chest CT (with IV contrast in the absence of contraindication) for further assessment to exclude adenopathy or central lung lesion. 3. Retrocardiac hiatal hernia. Electronically Signed   By: Keith Rake M.D.   On: 10/28/2022 17:56    Procedures Procedures    Medications Ordered in ED Medications  lactated ringers infusion ( Intravenous New Bag/Given 10/28/22 1659)  doxazosin (CARDURA) tablet 1 mg (has no administration in time range)  acetaminophen  (TYLENOL) tablet 1,000 mg (1,000 mg Oral Given 10/28/22 1656)  tetracaine (PONTOCAINE) 0.5 % ophthalmic solution 1 drop (1 drop Both Eyes Given 10/28/22 1745)  doxazosin (CARDURA) tablet 1 mg (1 mg Oral Given 10/28/22 1935)    ED Course/ Medical Decision Making/ A&P  Medical Decision Making Amount and/or Complexity of Data Reviewed Independent Historian: caregiver and EMS    Details: His son External Data Reviewed: notes. Labs: ordered. Decision-making details documented in ED Course. Radiology: ordered and independent interpretation performed. Decision-making details documented in ED Course. ECG/medicine tests: ordered and independent interpretation performed. Decision-making details documented in ED Course.  Risk OTC drugs. Prescription drug management.   Pt with multiple medical problems and comorbidities and presenting today with a complaint that caries a high risk for morbidity and mortality.  Patient here today complaining of a headache that has been present for the last 2 to 3 days with intermittently feeling bad and some generalized weakness.  However he did mention to urgent care that he has had intermittent right flank pain even though he does not mention it here.  On exam patient is awake alert and in no acute distress.  He has no focal neurologic findings.  Blood pressure here was 209/75.  He did take his blood pressure medications today.  He has not received any medications prior to arrival here.  Patient's pupils are unequal however he does get steroid shots for macular degeneration and is unclear what his baseline is.  Will check to ensure no evidence of increased intraocular pressure causing his headache.  Also will do a CT to rule out bleed.  Labs are pending.  Rectal temperature was normal and lower suspicion for infection at this time however given the intermittent flank pain we will check a urine.  Patient given Tylenol for the headache.  11:03 PM I  independently interpreted patient's labs and EKG.  EKG without acute changes.  UA without significant changes, viral panel is negative, CBC without acute findings, BMP without acute findings and troponin is normal.  I have independently visualized and interpreted pt's images today.  Head CT without evidence of bleed and radiology reports remote findings but nothing acute.  Chest x-ray without acute findings but radiology reports that patient has left hilar prominence may be related to overlying vascular structures and recommended a chest CT to rule out adenopathy.  When discussing with the patient if he was having any flank pain he reports he has not at this time it seems to come and go and does not seem specifically worried about it.  Intraocular pressure was checked in both eyes it was 8 in the right eye and 11 in the left eye and do not feel that that is the cause of his symptoms.  Repeat blood pressure is now 186/73.  When speaking with the patient's son who is present at bedside he reports that patient has been under more stress as his wife had been ill and he is most likely not eating what he should which may also be affecting his blood pressure.  Will give a dose of his home meds.  Patient was able to walk here and denied feeling dizzy denies having a headache at this time.  11:03 PM Patient's blood pressure has remained elevated here and he was given an additional milligram of his Cardura.  Son is present at bedside and reports patient has been under a lot more stress recently as his wife had been ill and has recently returned home and things just are not the same.  He also is eating out more which may also be contributing to the elevated blood pressure.  Here patient reports that his head feels much better he does not have a headache or any dizziness at this time.  Discussed  with him checking his blood pressure daily and recording the results.  Also gave parameters to take an additional Cardura as needed  for elevated blood pressures and following up with Dr. Damita Dunnings for repeat check next week.  Also talking with the son patient and his wife are having more difficulty around the house and home health was ordered and a consult was placed to transition of care.  At this time there is no indication for admission.  Feel that patient is stable for discharge home but will need to continue management of his blood pressure.  Did discuss with his son any worrisome developing symptoms that would prompt them to return including confusion, speech difficulty, unilateral weakness or numbness.          Final Clinical Impression(s) / ED Diagnoses Final diagnoses:  Uncontrolled hypertension    Rx / DC Orders ED Discharge Orders          [facility]        10/28/22 2005    Face-to-face encounter (required for Medicare/Medicaid patients)  Status:  Canceled       Comments: I Blanchie Dessert certify that this patient is under my care and that I, or a nurse practitioner or physician's assistant working with me, had a face-to-face encounter that meets the physician face-to-face encounter requirements with this patient on 10/28/2022. The encounter with the patient was in whole, or in part for the following medical condition(s) which is the primary reason for home health care (List medical condition): pt with HA, htn, dizziness and sometimes trouble walking.   10/28/22 2005    Home Health        10/28/22 2047    Face-to-face encounter (required for Medicare/Medicaid patients)       Comments: [facility] certify that this patient is under my care and that I, or a nurse practitioner or physician's assistant working with me, had a face-to-face encounter that meets the physician face-to-face encounter requirements with this patient on 10/28/2022. The encounter with the patient was in whole, or in part for the following medical condition(s) which is the primary reason for home health care (List medical  condition): pt with worsening funcitonal status.  HA and dizziness intermittently.  Fluctuanting blood pressures.  Sometimes trouble walking   10/28/22 2047              Blanchie Dessert, MD 10/28/22 [phone]

## 2022-11-02 ENCOUNTER — Telehealth: Payer: Self-pay

## 2022-11-02 NOTE — Telephone Encounter (Signed)
     Patient  visit on 3/8  at [facility]   Have you been able to follow up with your primary care physician? Yes   The patient was or was not able to obtain any needed medicine or equipment. Yes   Are there diet recommendations that you are having difficulty following? Na   Patient expresses understanding of discharge instructions and education provided has no other needs at this time.  Yes      [place] [phone] [address], [place], [address] [address] Phone: [phone] Email: [email or web address]

## 2022-11-03 ENCOUNTER — Ambulatory Visit (INDEPENDENT_AMBULATORY_CARE_PROVIDER_SITE_OTHER): Payer: PPO | Admitting: Family Medicine

## 2022-11-03 ENCOUNTER — Encounter: Payer: Self-pay | Admitting: Family Medicine

## 2022-11-03 VITALS — BP 150/80 | HR 80 | Temp 97.8°F | Ht 66.0 in | Wt 149.0 lb

## 2022-11-03 DIAGNOSIS — R9389 Abnormal findings on diagnostic imaging of other specified body structures: Secondary | ICD-10-CM | POA: Diagnosis not present

## 2022-11-03 DIAGNOSIS — I10 Essential (primary) hypertension: Secondary | ICD-10-CM

## 2022-11-03 NOTE — Patient Instructions (Signed)
Don't change your meds for now.  Keep taking tramadol at baseline.  Let me know if you don't get a call about the chest scan.  Take care.  Glad to see you.

## 2022-11-03 NOTE — Progress Notes (Signed)
ER f/u re: HTN.  Discussed pupil findings.  See exam below.  He had stopped tramadol in the meantime.  Unclear if that was related to recent events.  BP improved in the meantime.  He has restarted tramadol over the last few days.  Some chronic cough, episodic, not a new issue.  Some white sputum.  Former smoker, distant past.  No hemoptysis.  Discussed recent imaging, see below.  Meds, vitals, and allergies reviewed.   ROS: Per HPI unless specifically indicated in ROS section   Nad Ncat L pupil > R pupil diameter Neck supple, no LA Rrr  ctab No BLE edema.  Skin well-perfused without edema.   CXR IMPRESSION: 1. Faint reticular changes in the periphery of the left lower lobe, may represent pneumonitis. 2. Left hilar prominence. On this may be related to overlapping vascular structures given differences in technique, however recommend chest CT (with IV contrast in the absence of contraindication) for further assessment to exclude adenopathy or central lung lesion. 3. Retrocardiac hiatal hernia.   CT HEAD WITHOUT CONTRASTIMPRESSION: 1. No evidence of acute intracranial abnormality. 2. Atrophy, remote white matter and RIGHT thalamic lacunar infarcts, and chronic small-vessel white matter ischemic changes. 3. Unchanged LEFT mastoid effusion and LEFT middle ear fluid. Correlate clinically.   30 minutes were devoted to patient care in this encounter (this includes time spent reviewing the patient's file/history, interviewing and examining the patient, counseling/reviewing plan with patient).

## 2022-11-06 ENCOUNTER — Other Ambulatory Visit: Payer: Self-pay | Admitting: Family Medicine

## 2022-11-06 NOTE — Assessment & Plan Note (Signed)
Blood pressure is not completely to goal but I do not want to induce relative hypotension.  He restarted tramadol in the meantime and it may be that treating his pain has helped with blood pressure control.  Continue with diltiazem doxazosin.

## 2022-11-06 NOTE — Assessment & Plan Note (Signed)
Left hilar prominence. On this may be related to overlapping vascular structures given differences in technique, however recommend chest CT (with IV contrast in the absence of contraindication) for further assessment to exclude adenopathy or central lung lesion.  Discussed getting follow-up CT, ordered.  See after visit summary.  Lungs are clear.  Okay for outpatient follow-up.

## 2022-11-09 ENCOUNTER — Telehealth: Payer: Self-pay | Admitting: Family Medicine

## 2022-11-09 MED ORDER — DOXAZOSIN MESYLATE 1 MG PO TABS: 1.0000 mg | ORAL_TABLET | Freq: Two times a day (BID) | ORAL | Status: DC

## 2022-11-09 NOTE — Telephone Encounter (Signed)
Patients wife called in stating that patients bp was running 188/85 this morning after he took his medication doxazosin (CARDURA) 1 MG tablet,so she gave him an extra pill which made it come down to 153/85. Now at the current moment  it is running 138/60. She would like to know if Dr Damita Dunnings thinks that he may need to change his medication or the dosage?

## 2022-11-09 NOTE — Telephone Encounter (Signed)
Spoke with patients wife and advised about medication dosing. Emma verbalized understanding.

## 2022-11-09 NOTE — Addendum Note (Signed)
Addended by: Tonia Ghent on: 11/09/2022 04:38 PM   Modules accepted: Orders

## 2022-11-09 NOTE — Telephone Encounter (Signed)
My understanding is that he is taking doxazosin 1 mg twice a day.  I think it makes sense to continue with that with one adjustment.  He can take an extra 1 mg tablet per day if his blood pressure is persistently above 150/90.  Please let me know how that goes.  Thanks.

## 2022-11-11 ENCOUNTER — Ambulatory Visit: Payer: PPO | Admitting: Pharmacist

## 2022-11-11 NOTE — Progress Notes (Signed)
Care Management & Coordination Services Pharmacy Note  11/11/2022 Name:  [name] MRN:  [id number] DOB:  14-Apr-1926  Summary: F/U visit -HTN: BP 174/72 1 hour after AM meds this morning, pt has taken an extra doxazosin 1 mg tablet and has not rechecked BP yet  Recommendations/Changes made from today's visit: -Advised to continue current regimen and contact provider if elevated BP persists  Follow up plan: -Health Concierge will call patient 1 month for BP update -Pharmacist follow up PRN -Chest CT 11/29/22    Subjective: [name] is an 89 y.o. year old male who is a primary patient of Damita Dunnings, Elveria Rising, MD.  The care coordination team was consulted for assistance with disease management and care coordination needs.    Engaged with patient by telephone for follow up visit.  Recent office visits: 11/09/22 TE - BP 188/85, gave extra doxazsin and BP 153/85, then 138/60. Pt advised to continue doxazisin 1 mg BID, with extra 1 tablet if BP > 150/90  11/03/22 Dr Damita Dunnings OV: ED f/u - BP 150/80. Rec chest CT for L hilar prominence. Pt has stopped tramadol, then restarted. Advised keep taking tramadol.  10/28/22 NP Matt Cable OV: BP 194/76 with flank pain, unequal pupils. Referred to ED.  09/29/22 Dr Damita Dunnings OV: ED f/u - pt may have missed BP meds which led to ED visit. No changes.  Recent consult visits: [facility] visits: 10/28/22 ED visit [facility]): uncontrolled HTN w/ headache; BP 160/103, 209/75, 186/73. Given additional dose of doxazosin.  09/16/22 ED visit [facility]): dizziness, weakness, HTN. Multiple falls. PT recommended home PT and walker - family declined. F/u with PCP.  Objective:  Lab Results  Component Value Date   CREATININE 0.94 10/28/2022   BUN 11 10/28/2022   GFR 55.07 (L) 03/31/2022   GFRNONAA >60 10/28/2022   GFRAA 74 05/19/2017   NA 134 (L) 10/28/2022   K 4.4 10/28/2022   CALCIUM 8.8 (L) 10/28/2022   CO2 21 (L) 10/28/2022   GLUCOSE 102 (H) 10/28/2022    Lab  Results  Component Value Date/Time   HGBA1C 6.0 (H) 03/30/2015 07:50 AM   HGBA1C 6.1 (H) 01/29/2015 04:41 AM   GFR 55.07 (L) 03/31/2022 04:05 PM   GFR 71.38 12/14/2021 11:41 AM    Last diabetic Eye exam: No results found for: "HMDIABEYEEXA"  Last diabetic Foot exam: No results found for: "HMDIABFOOTEX"   Lab Results  Component Value Date   CHOL 165 05/25/2021   HDL 64.70 05/25/2021   LDLCALC 84 05/25/2021   LDLDIRECT 167.2 08/12/2013   TRIG 77.0 05/25/2021   CHOLHDL 3 05/25/2021       Latest Ref Rng & Units 09/16/2022    5:23 PM 05/27/2022    4:20 PM 12/14/2021   11:41 AM  Hepatic Function  Total Protein 6.5 - 8.1 g/dL 6.4  7.0  6.4   Albumin 3.5 - 5.0 g/dL 3.3   3.9   AST 15 - 41 U/L 26  15  17    ALT 0 - 44 U/L 28  8  11    Alk Phosphatase 38 - 126 U/L 80   65   Total Bilirubin 0.3 - 1.2 mg/dL 0.4  0.5  0.5     Lab Results  Component Value Date/Time   TSH 3.78 05/27/2022 04:20 PM   TSH 2.87 12/14/2021 11:41 AM       Latest Ref Rng & Units 10/28/2022    4:40 PM 09/16/2022    5:23 PM 05/27/2022  4:20 PM  CBC  WBC 4.0 - 10.5 K/uL 8.2  5.1  8.4   Hemoglobin 13.0 - 17.0 g/dL 14.4  15.2  15.5   Hematocrit 39.0 - 52.0 % 44.2  44.2  45.8   Platelets 150 - 400 K/uL 247  196  251     Lab Results  Component Value Date/Time   VD25OH 42 05/27/2022 04:22 PM   VITAMINB12 1,657 (H) 05/27/2022 04:22 PM   [id number] 12/02/2019 08:38 AM    Clinical ASCVD: Yes  The ASCVD Risk score (Arnett DK, et al., 2019) failed to calculate for the following reasons:   The 2019 ASCVD risk score is only valid for ages 68 to 57   The patient has a prior MI or stroke diagnosis       11/03/2022   12:07 PM 03/31/2022    2:56 PM 01/31/2022    2:23 PM  Depression screen PHQ 2/9  Decreased Interest 0 0 0  Down, Depressed, Hopeless 0 0 0  PHQ - 2 Score 0 0 0  Altered sleeping 0 0 0  Tired, decreased energy 0 0 0  Change in appetite 0 0 0  Feeling bad or failure about yourself  0 0 0   Trouble concentrating 0 0 0  Moving slowly or fidgety/restless 0 0 0  Suicidal thoughts 0 0 0  PHQ-9 Score 0 0 0  Difficult doing work/chores Not difficult at all Not difficult at all Not difficult at all     Social History   Tobacco Use  Smoking Status Former   Packs/day: 0.00   Years: 36.00   Additional pack years: 0.00   Total pack years: 0.00   Types: Cigarettes  Smokeless Tobacco Never  Tobacco Comments   Quit in 1983   BP Readings from Last 3 Encounters:  11/03/22 (!) 150/80  10/28/22 (!) 185/81  10/28/22 (!) 194/76   Pulse Readings from Last 3 Encounters:  11/03/22 80  10/28/22 64  10/28/22 82   Wt Readings from Last 3 Encounters:  11/03/22 149 lb (67.6 kg)  10/28/22 150 lb 4 oz (68.2 kg)  09/29/22 149 lb (67.6 kg)   BMI Readings from Last 3 Encounters:  11/03/22 24.05 kg/m  10/28/22 24.25 kg/m  09/29/22 24.05 kg/m    Allergies  Allergen Reactions   Angiotensin Receptor Blockers Swelling    Lip and face swelling prev noted   Atorvastatin Other (See Comments)    REACTION: muscles tense up    Codeine Nausea And Vomiting   Lisinopril Swelling and Other (See Comments)    Lips and face swelling   Statins Other (See Comments)    Muscle tense up   Gabapentin Other (See Comments)    Dizzy sensation    Medications Reviewed Today     Reviewed by Charlton Haws, RPH (Pharmacist) on 11/11/22 at 59  Med List Status: <None>   Medication Order Taking? Sig Documenting Provider Last Dose Status Informant  acetaminophen (TYLENOL) 500 MG tablet [id number] Yes Take 1,000 mg by mouth every 6 (six) hours as needed for headache (pain). [provider] Taking Active Spouse/Significant Other  AFLIBERCEPT IO [id number] Yes Inject into the eye See admin instructions. Right eye injection every 4 weeks [provider] Taking Active Spouse/Significant Other  Alirocumab (PRALUENT) 75 MG/ML SOAJ [id number] Yes INJECT 75 MG INTO THE SKIN EVERY 14  (FOURTEEN) DAYS. Jettie Booze, MD Taking Active   Bevacizumab (AVASTIN IV) [id number] Yes Place into the left eye See  admin instructions. Left eye injection every 8 weeks [provider] Taking Active Spouse/Significant Other           Med Note Earna Coder Sep 28, 2020  2:12 PM)    cetirizine (ZYRTEC) 10 MG tablet [id number] Yes Take 10 mg by mouth daily as needed for allergies. [provider] Taking Active Spouse/Significant Other  clopidogrel (PLAVIX) 75 MG tablet [id number] Yes TAKE 1 TABLET BY MOUTH EVERY DAY Tonia Ghent, MD Taking Active   diltiazem (CARDIZEM CD) 180 MG 24 hr capsule [id number] Yes TAKE 1 CAPSULE BY MOUTH EVERY DAY Tonia Ghent, MD Taking Active   doxazosin (CARDURA) 1 MG tablet [id number] Yes Take 1 tablet (1 mg total) by mouth 2 (two) times daily. With extra 1 mg tablet per day if blood pressure is above 150/90. Tonia Ghent, MD Taking Active   famotidine (PEPCID) 20 MG tablet [id number] Yes TAKE 1 TABLET (20 MG TOTAL) BY MOUTH 2 (TWO) TIMES DAILY AS NEEDED FOR HEARTBURN OR INDIGESTION. Tonia Ghent, MD Taking Active   ferrous sulfate 325 (65 FE) MG tablet [id number] Yes Take 1 tablet (325 mg total) by mouth daily with breakfast. Tonia Ghent, MD Taking Active Spouse/Significant Other  fluticasone (FLONASE) 50 MCG/ACT nasal spray [id number] Yes Place 2 sprays into both nostrils daily as needed for allergies. Tonia Ghent, MD Taking Active Spouse/Significant Other  loperamide (IMODIUM) 2 MG capsule [id number] Yes Take 1 capsule (2 mg total) by mouth as needed for diarrhea or loose stools. Lavina Hamman, MD Taking Active   Multiple Vitamins-Minerals [facility] ADULT 50+ PO) [id number] Yes Take 1 capsule by mouth daily with lunch. [provider] Taking Active Spouse/Significant Other  nitroGLYCERIN (NITROSTAT) 0.4 MG SL tablet [id number] Yes Place 1 tablet (0.4 mg total) under the tongue every 5 (five) minutes as needed for  chest pain (max 3 doses in 15 minutes). Venia Carbon, MD Taking Active Spouse/Significant Other           Med Note Earna Coder Sep 28, 2020  2:12 PM)    pantoprazole (PROTONIX) 40 MG tablet [id number] Yes TAKE 1 TABLET BY MOUTH EVERY DAY IN THE MORNING Tonia Ghent, MD Taking Active   Polyethyl Glycol-Propyl Glycol (SYSTANE) 0.4-0.3 % SOLN [id number] Yes Place 1 drop into both eyes 3 (three) times daily as needed (dry eyes). [provider] Taking Active Spouse/Significant Other  rOPINIRole (REQUIP) 0.5 MG tablet [id number] Yes TAKE 1 TABLET BY MOUTH EVERYDAY AT BEDTIME Tonia Ghent, MD Taking Active   tamsulosin (FLOMAX) 0.4 MG CAPS capsule [id number] Yes TAKE 1 CAPSULE BY MOUTH EVERY DAY Tonia Ghent, MD Taking Active Spouse/Significant Other  traMADol (ULTRAM) 50 MG tablet [id number] Yes Take 1 tablet (50 mg total) by mouth every 8 (eight) hours as needed. for pain Tonia Ghent, MD Taking Active   vitamin B-12 (CYANOCOBALAMIN) 1000 MCG tablet [id number] Yes Take 2 tablets (2,000 mcg total) by mouth daily. Tonia Ghent, MD Taking Active             SDOH:  (Social Determinants of Health) assessments and interventions performed: No SDOH Interventions    Flowsheet Row Clinical Support from 01/31/2022 in [place] at [facility] from 01/28/2021 in [place] at [facility] from 11/29/2019 in [place] at [facility] Interventions Intervention  Not Indicated -- --  Housing Interventions Intervention Not Indicated -- --  Transportation Interventions Patient Resources (Friends/Family) -- --  Depression Interventions/Treatment  -- PHQ2-9 Score <4 Follow-up Not Indicated PHQ2-9 Score <4 Follow-up Not Indicated  Financial Strain Interventions Intervention Not Indicated -- --  Physical Activity Interventions Intervention Not  Indicated -- --  Stress Interventions Intervention Not Indicated -- --  Social Connections Interventions Intervention Not Indicated -- --       Medication Assistance: None required.  Patient affirms current coverage meets needs.  Medication Access: Within the past 30 days, how often has patient missed a dose of medication? 0 Is a pillbox or other method used to improve adherence? Yes  Factors that may affect medication adherence? no barriers identified Are meds synced by current pharmacy? No  Are meds delivered by current pharmacy? No  Does patient experience delays in picking up medications due to transportation concerns? No   [facility] Services Reviewed: Is patient disadvantaged to use [facility]?: Yes  Current Rx insurance plan: HTA Name and location of Current pharmacy:  [id number] [place], [address] [place] [address] Phone: [phone] Fax: [phone]  [facility] services reviewed with patient today?: No  Patient requests to transfer care to [facility]?: No  Reason patient declined to change pharmacies: Disadvantaged due to insurance/mail order  Compliance/Adherence/Medication fill history: Care Gaps: None  Star-Rating Drugs: None   ASSESSMENT / PLAN  Hypertension (BP goal <150/90) -Not ideally controlled -Home BP: 174/72 (1 hr after AM meds); took an extra doxazosin, has not rechecked yet -Current treatment: Diltiazem CD 180 mg daily - Appropriate, Effective, Safe, Accessible Doxazosin 1 mg BID w/ extra tab PRN BP > 150/90 - Appropriate, Effective, Safe, Accessible -Medications previously tried/failed: ACE/ARB (angioedema), amlodipine, furosemide, HCTZ, metoprolol -Current dietary habits: vegetables from garden, meat 1-2 times per week -Current exercise habits: working in the yard, gardening -Educated on BP goals and benefits of medications for prevention of heart attack, stroke and kidney  damage; Symptoms of hypotension and importance of maintaining adequate hydration; -Recommended to continue current medication  Hyperlipidemia / CAD: (LDL goal < 70) -Controlled - LDL 84 (05/2021) -Hx CAD, CVA ("mini strokes" per patient) -Current treatment: Praluent 75 mg q14 days - Appropriate, Effective, Safe, Accessible Clopidogrel 75 mg daily - Appropriate, Effective, Safe, Accessible Nitroglycerin 0.4 mg SL prn - never used -Medications previously tried/failed: gemfibrozil, atorvastatin (muscle aches) -Educated on Cholesterol goals; importance of continued compliance with Praulent -Recommend to continue current medication  Arthralgia (Goal: manage pain) -Not ideally controlled -Current treatment  Tramadol 50 mg BID - Appropriate, Effective, Safe, Accessible -Medications previously tried: n/a  -Recommended to continue current medication  Health Maintenance -Vaccine gaps: covid booster, Shingrix -COPD: stable period without inhalers; no PFT on file. Former smoker (quit 1983) -BPH: on tamsulosin, doxazosin -GERD/Barrett's esophagus: on pantoprazole, famotidine -RLS: on ropinirole   Charlene Brooke, PharmD, [place], CPP Clinical Pharmacist Practitioner [place] at [facility] [phone]

## 2022-11-11 NOTE — Patient Instructions (Signed)
Visit Information  Phone number for Pharmacist: [phone]  Thank you for meeting with me to discuss your medications! Below is a summary of what we talked about during the visit:   Recommendations/Changes made from today's visit: -Advised to continue current regimen and contact provider if elevated BP persists  Follow up plan: -Health Concierge will call patient 1 month for BP update -Pharmacist follow up PRN -Chest CT 11/29/22   Charlene Brooke, PharmD, BCACP Clinical Pharmacist [facility] [phone]

## 2022-11-24 ENCOUNTER — Ambulatory Visit (INDEPENDENT_AMBULATORY_CARE_PROVIDER_SITE_OTHER): Payer: PPO | Admitting: Family Medicine

## 2022-11-24 ENCOUNTER — Encounter: Payer: Self-pay | Admitting: Family Medicine

## 2022-11-24 VITALS — BP 170/85 | HR 79 | Temp 97.8°F | Ht 66.0 in | Wt 149.5 lb

## 2022-11-24 DIAGNOSIS — L989 Disorder of the skin and subcutaneous tissue, unspecified: Secondary | ICD-10-CM

## 2022-11-24 DIAGNOSIS — R5383 Other fatigue: Secondary | ICD-10-CM

## 2022-11-24 DIAGNOSIS — I10 Essential (primary) hypertension: Secondary | ICD-10-CM

## 2022-11-24 LAB — CBC WITH DIFFERENTIAL/PLATELET
Basophils Absolute: 0 10*3/uL (ref 0.0–0.1)
Basophils Relative: 0.5 % (ref 0.0–3.0)
Eosinophils Absolute: 0.2 10*3/uL (ref 0.0–0.7)
Eosinophils Relative: 2 % (ref 0.0–5.0)
HCT: 43.9 % (ref 39.0–52.0)
Hemoglobin: 14.6 g/dL (ref 13.0–17.0)
Lymphocytes Relative: 15.1 % (ref 12.0–46.0)
Lymphs Abs: 1.2 10*3/uL (ref 0.7–4.0)
MCHC: 33.3 g/dL (ref 30.0–36.0)
MCV: 89.2 fl (ref 78.0–100.0)
Monocytes Absolute: 0.5 10*3/uL (ref 0.1–1.0)
Monocytes Relative: 6.8 % (ref 3.0–12.0)
Neutro Abs: 6.1 10*3/uL (ref 1.4–7.7)
Neutrophils Relative %: 75.6 % (ref 43.0–77.0)
Platelets: 284 10*3/uL (ref 150.0–400.0)
RBC: 4.93 Mil/uL (ref 4.22–5.81)
RDW: 13.7 % (ref 11.5–15.5)
WBC: 8 10*3/uL (ref 4.0–10.5)

## 2022-11-24 LAB — HEPATIC FUNCTION PANEL
ALT: 10 U/L (ref 0–53)
AST: 17 U/L (ref 0–37)
Albumin: 4.1 g/dL (ref 3.5–5.2)
Alkaline Phosphatase: 84 U/L (ref 39–117)
Bilirubin, Direct: 0.1 mg/dL (ref 0.0–0.3)
Total Bilirubin: 0.4 mg/dL (ref 0.2–1.2)
Total Protein: 7.2 g/dL (ref 6.0–8.3)

## 2022-11-24 LAB — BASIC METABOLIC PANEL
BUN: 14 mg/dL (ref 6–23)
CO2: 28 mEq/L (ref 19–32)
Calcium: 9.5 mg/dL (ref 8.4–10.5)
Chloride: 102 mEq/L (ref 96–112)
Creatinine, Ser: 0.9 mg/dL (ref 0.40–1.50)
GFR: 72.8 mL/min (ref >60.00)
Glucose, Bld: 109 mg/dL — ABNORMAL HIGH (ref 70–99)
Potassium: 4.1 mEq/L (ref 3.5–5.1)
Sodium: 138 mEq/L (ref 135–145)

## 2022-11-24 NOTE — Progress Notes (Signed)
[name] T. Mitchelle Goerner, MD, CAQ Sports Medicine [facility] at [facility] [address] [place], [address]  Phone: [phone]  FAX: [phone]  [name] - 89 y.o. male  MRN [id number]  Date of Birth: [birth year]  Date: 11/24/2022  PCP: Joaquim Nam, MD  Referral: Joaquim Nam, MD  Chief Complaint  Patient presents with   Hypertension    160/82 this morning per wife Patient states he feels so bad he can hardly holds his head up States his BP is yo-yoing   Skin Lesion    Left Side of Face   Fatigue   Decrease Appetite   Subjective:   MAGDIEL Fields is a 89 y.o. very pleasant male patient with Body mass index is 24.13 kg/m. who presents with the following:  "Just feels bad."  Skin lesion, L eye.  This has been present for years, but it looks as if it is enlarged and pearly in appearance.  I think it has been getting bigger  No headaches, normal BM, all other.  No headache, chest pain, shortness of breath, earache, sore throat, cough, congestion, nausea, vomiting, diarrhea, joint aches, rashes, normal urination. -An extensive review of systems was performed, and effectively this was entirely negative.  Has been eating a lot of fast food.  His wife's health has been poor, and he is not able to cook.  They have been eating a lot of fast food and takeout food Drinking plenty of water.  Urinating more than normal.  But drinking a lot of water.  No chest pain, no sob No blood urine, stool   170/80   Review of Systems is noted in the HPI, as appropriate  Objective:   BP (!) 142/80   Pulse 79   Temp 97.8 F (36.6 C) (Temporal)   Ht 5\' 6"  (1.676 m)   Wt 149 lb 8 oz (67.8 kg)   SpO2 95%   BMI 24.13 kg/m   GEN: No acute distress; alert,appropriate. PULM: Breathing comfortably in no respiratory distress PSYCH: Normally interactive.  CV: RRR, no m/g/r  PULM: Normal respiratory rate, no accessory muscle use. No wheezes, crackles or  rhonchi  Skin lesion roughly 1 cm across just adjacent to the left eye.   Laboratory and Imaging Data:  Assessment and Plan:     ICD-10-CM   1. Other fatigue  R53.83 Basic metabolic panel    CBC with Differential/Platelet    Hepatic function panel    2. Skin lesion of face  L98.9 Ambulatory referral to Dermatology    3. Essential hypertension  I10      Generally "feels bad," but he has no focal changes on exam, and his entire review of systems which was extensive was entirely negative.  I am suspicious that this is probably blood pressure driven.  We will do a trial of 2 tablets of doxazosin twice a day.  If his symptoms persist, he may need to see his primary care doctor.  Skin lesion on face, think is reasonable to have dermatology look at this to make sure it is not a cancer.  Patient Instructions  Doxazosin - take 2 tablets twice a day   Orders placed today for conditions managed today: Orders Placed This Encounter  Procedures   Basic metabolic panel   CBC with Differential/Platelet   Hepatic function panel   Ambulatory referral to Dermatology    Disposition: No follow-ups on file.  Dragon [employer] One speech-to-text software was  used for transcription in this dictation.  Possible transcriptional errors can occur using Animal nutritionist.   Signed,  Elpidio Galea. Daurice Ovando, MD   Outpatient Encounter Medications as of 11/24/2022  Medication Sig   acetaminophen (TYLENOL) 500 MG tablet Take 1,000 mg by mouth every 6 (six) hours as needed for headache (pain).   AFLIBERCEPT IO Inject into the eye See admin instructions. Right eye injection every 4 weeks   Alirocumab (PRALUENT) 75 MG/ML SOAJ INJECT 75 MG INTO THE SKIN EVERY 14 (FOURTEEN) DAYS.   Bevacizumab (AVASTIN IV) Place into the left eye See admin instructions. Left eye injection every 8 weeks   cetirizine (ZYRTEC) 10 MG tablet Take 10 mg by mouth daily as needed for allergies.   clopidogrel (PLAVIX) 75 MG tablet TAKE 1  TABLET BY MOUTH EVERY DAY   diltiazem (CARDIZEM CD) 180 MG 24 hr capsule TAKE 1 CAPSULE BY MOUTH EVERY DAY   doxazosin (CARDURA) 1 MG tablet Take 1 tablet (1 mg total) by mouth 2 (two) times daily. With extra 1 mg tablet per day if blood pressure is above 150/90.   famotidine (PEPCID) 20 MG tablet TAKE 1 TABLET (20 MG TOTAL) BY MOUTH 2 (TWO) TIMES DAILY AS NEEDED FOR HEARTBURN OR INDIGESTION.   ferrous sulfate 325 (65 FE) MG tablet Take 1 tablet (325 mg total) by mouth daily with breakfast.   fluticasone (FLONASE) 50 MCG/ACT nasal spray Place 2 sprays into both nostrils daily as needed for allergies.   loperamide (IMODIUM) 2 MG capsule Take 1 capsule (2 mg total) by mouth as needed for diarrhea or loose stools.   Multiple Vitamins-Minerals (OCUVITE ADULT 50+ PO) Take 1 capsule by mouth daily with lunch.   nitroGLYCERIN (NITROSTAT) 0.4 MG SL tablet Place 1 tablet (0.4 mg total) under the tongue every 5 (five) minutes as needed for chest pain (max 3 doses in 15 minutes).   pantoprazole (PROTONIX) 40 MG tablet TAKE 1 TABLET BY MOUTH EVERY DAY IN THE MORNING   Polyethyl Glycol-Propyl Glycol (SYSTANE) 0.4-0.3 % SOLN Place 1 drop into both eyes 3 (three) times daily as needed (dry eyes).   rOPINIRole (REQUIP) 0.5 MG tablet TAKE 1 TABLET BY MOUTH EVERYDAY AT BEDTIME   tamsulosin (FLOMAX) 0.4 MG CAPS capsule TAKE 1 CAPSULE BY MOUTH EVERY DAY   traMADol (ULTRAM) 50 MG tablet Take 1 tablet (50 mg total) by mouth every 8 (eight) hours as needed. for pain   vitamin B-12 (CYANOCOBALAMIN) 1000 MCG tablet Take 2 tablets (2,000 mcg total) by mouth daily.   No facility-administered encounter medications on file as of 11/24/2022.

## 2022-11-24 NOTE — Patient Instructions (Signed)
Doxazosin - take 2 tablets twice a day

## 2022-11-25 ENCOUNTER — Telehealth: Payer: Self-pay | Admitting: Family Medicine

## 2022-11-25 NOTE — Telephone Encounter (Signed)
Patients wife called in stating that the place on patients face look a lot worse this morning.A referral was sent out to dermatology in [place]. She said that if he could be seen sooner in [place],that would be great.I did advise her that we wouldn't know exactly how soon they would be able to get him in at either location,but she just want a referral to be sen to [place] as well.

## 2022-11-25 NOTE — Telephone Encounter (Signed)
I can send to [facility]) to see if they can work them in sooner but the referral is marked as "Routine" so they are not going to work the patient in sooner than first available.   If a sooner appt is needed, an additional referral needs to be placed for Urgent Referral to [facility] D[facility] location.

## 2022-11-25 NOTE — Telephone Encounter (Signed)
I called the patient's wife.  She said that it does look worse than it did a month or so ago, but not that much compared to yesterday.  There is no pain and no warmth.  He otherwise is doing ok.  I am not inclined to do any additional intervention.   Please see if they can see him at the dermatologist in [place].

## 2022-11-28 ENCOUNTER — Other Ambulatory Visit: Payer: Self-pay | Admitting: Family Medicine

## 2022-11-28 ENCOUNTER — Encounter: Payer: Self-pay | Admitting: Family Medicine

## 2022-11-28 ENCOUNTER — Ambulatory Visit (INDEPENDENT_AMBULATORY_CARE_PROVIDER_SITE_OTHER): Payer: PPO | Admitting: Family Medicine

## 2022-11-28 VITALS — BP 128/60 | HR 87 | Temp 97.7°F | Ht 66.0 in | Wt 148.4 lb

## 2022-11-28 DIAGNOSIS — I10 Essential (primary) hypertension: Secondary | ICD-10-CM | POA: Diagnosis not present

## 2022-11-28 DIAGNOSIS — G2581 Restless legs syndrome: Secondary | ICD-10-CM

## 2022-11-28 DIAGNOSIS — L0201 Cutaneous abscess of face: Secondary | ICD-10-CM

## 2022-11-28 MED ORDER — DOXAZOSIN MESYLATE 1 MG PO TABS: 2.0000 mg | ORAL_TABLET | Freq: Two times a day (BID) | ORAL | 1 refills | Status: DC

## 2022-11-28 MED ORDER — DOXYCYCLINE HYCLATE 100 MG PO TABS: 100.0000 mg | ORAL_TABLET | Freq: Two times a day (BID) | ORAL | 0 refills | Status: DC

## 2022-11-28 NOTE — Progress Notes (Signed)
Martin Denapoli T. Tanav Orsak, MD, CAQ Sports Medicine [facility] at [facility] [address] [place], [address]  Phone: [phone]  FAX: [phone]  [name] - 89 y.o. male  MRN [id number]  Date of Birth: [birth year]  Date: 11/28/2022  PCP: Joaquim Nam, MD  Referral: Joaquim Nam, MD  Chief Complaint  Patient presents with   Skin Lesion    Place on face is worse.  States the area did drain yesterday   Subjective:   [name] is a 89 y.o. very pleasant male patient with Body mass index is 23.95 kg/m. who presents with the following:  Patient has a place on his left face.  I ended up seeing him on April 4 for multiple other complaints.  He generally did not feel well.  He also had a lesion on his left forehead dated reportedly been there for years.  They are concerned about this, and wanted him to follow-up again about this today.  After he saw me through the end of the week and weekend, this area on the left became somewhat larger, reddened in appearance, and within the last 2 days it started to open up and drain.  It does feel quite a bit better today than it did yesterday.  They think that it did have some warmth associated with it.  Review of Systems is noted in the HPI, as appropriate  Objective:   BP 128/60   Pulse 87   Temp 97.7 F (36.5 C) (Temporal)   Ht 5\' 6"  (1.676 m)   Wt 148 lb 6 oz (67.3 kg)   SpO2 95%   BMI 23.95 kg/m   GEN: No acute distress; alert,appropriate. PULM: Breathing comfortably in no respiratory distress PSYCH: Normally interactive.     Laboratory and Imaging Data:  Assessment and Plan:     ICD-10-CM   1. Cutaneous abscess of face  L02.01     2. Essential hypertension  I10      Small abscess in the region of a skin lesion.  It sounds as if this got infected and then started to drain.  I am going to placed the patient on doxycycline as well. -Ultimately, I do think this needs to be looked at by  dermatology, and I referred him last week.  High blood pressure is more stable, and he generally feels better than he did last week.  I am going to keep him on his elevated blood pressure medications.  He can keep routine follow-up with his primary care doctor.  Medication Management during today's office visit: Meds ordered this encounter  Medications   doxazosin (CARDURA) 1 MG tablet    Sig: Take 2 tablets (2 mg total) by mouth 2 (two) times daily.    Dispense:  120 tablet    Refill:  1   doxycycline (VIBRA-TABS) 100 MG tablet    Sig: Take 1 tablet (100 mg total) by mouth 2 (two) times daily.    Dispense:  20 tablet    Refill:  0   Medications Discontinued During This Encounter  Medication Reason   doxazosin (CARDURA) 1 MG tablet Reorder    Orders placed today for conditions managed today: No orders of the defined types were placed in this encounter.   Disposition: No follow-ups on file.  Dr[employer] One speech-to-text software was used for transcription in this dictation.  Possible transcriptional errors can occur using Animal nutritionist.   Signed,  Elpidio Galea. Jaelie Aguilera,  MD   Outpatient Encounter Medications as of 11/28/2022  Medication Sig   acetaminophen (TYLENOL) 500 MG tablet Take 1,000 mg by mouth every 6 (six) hours as needed for headache (pain).   AFLIBERCEPT IO Inject into the eye See admin instructions. Right eye injection every 4 weeks   Alirocumab (PRALUENT) 75 MG/ML SOAJ INJECT 75 MG INTO THE SKIN EVERY 14 (FOURTEEN) DAYS.   Bevacizumab (AVASTIN IV) Place into the left eye See admin instructions. Left eye injection every 8 weeks   cetirizine (ZYRTEC) 10 MG tablet Take 10 mg by mouth daily as needed for allergies.   clopidogrel (PLAVIX) 75 MG tablet TAKE 1 TABLET BY MOUTH EVERY DAY   diltiazem (CARDIZEM CD) 180 MG 24 hr capsule TAKE 1 CAPSULE BY MOUTH EVERY DAY   doxycycline (VIBRA-TABS) 100 MG tablet Take 1 tablet (100 mg total) by mouth 2 (two) times daily.    famotidine (PEPCID) 20 MG tablet TAKE 1 TABLET (20 MG TOTAL) BY MOUTH 2 (TWO) TIMES DAILY AS NEEDED FOR HEARTBURN OR INDIGESTION.   ferrous sulfate 325 (65 FE) MG tablet Take 1 tablet (325 mg total) by mouth daily with breakfast.   fluticasone (FLONASE) 50 MCG/ACT nasal spray Place 2 sprays into both nostrils daily as needed for allergies.   loperamide (IMODIUM) 2 MG capsule Take 1 capsule (2 mg total) by mouth as needed for diarrhea or loose stools.   Multiple Vitamins-Minerals (OCUVITE ADULT 50+ PO) Take 1 capsule by mouth daily with lunch.   nitroGLYCERIN (NITROSTAT) 0.4 MG SL tablet Place 1 tablet (0.4 mg total) under the tongue every 5 (five) minutes as needed for chest pain (max 3 doses in 15 minutes).   pantoprazole (PROTONIX) 40 MG tablet TAKE 1 TABLET BY MOUTH EVERY DAY IN THE MORNING   Polyethyl Glycol-Propyl Glycol (SYSTANE) 0.4-0.3 % SOLN Place 1 drop into both eyes 3 (three) times daily as needed (dry eyes).   rOPINIRole (REQUIP) 0.5 MG tablet TAKE 1 TABLET BY MOUTH EVERYDAY AT BEDTIME   tamsulosin (FLOMAX) 0.4 MG CAPS capsule TAKE 1 CAPSULE BY MOUTH EVERY DAY   traMADol (ULTRAM) 50 MG tablet Take 1 tablet (50 mg total) by mouth every 8 (eight) hours as needed. for pain   vitamin B-12 (CYANOCOBALAMIN) 1000 MCG tablet Take 2 tablets (2,000 mcg total) by mouth daily.   [DISCONTINUED] doxazosin (CARDURA) 1 MG tablet Take 1 tablet (1 mg total) by mouth 2 (two) times daily. With extra 1 mg tablet per day if blood pressure is above 150/90. (Patient taking differently: Take 2 mg by mouth 2 (two) times daily.)   doxazosin (CARDURA) 1 MG tablet Take 2 tablets (2 mg total) by mouth 2 (two) times daily.   No facility-administered encounter medications on file as of 11/28/2022.

## 2022-11-29 ENCOUNTER — Other Ambulatory Visit: Payer: PPO

## 2022-11-29 NOTE — Telephone Encounter (Signed)
I am sending the referral to [facility]  to see if able to see the patient sooner.   The referral was sent internally to [facility] and their first available given the referral was marked 'routine' and not 'urgent' is March 2025.   See referral for updates.

## 2022-11-30 ENCOUNTER — Ambulatory Visit
Admission: RE | Admit: 2022-11-30 | Discharge: 2022-11-30 | Disposition: A | Discharge status: Home / Self Care | Payer: PPO | Source: Ambulatory Visit | Attending: Family Medicine | Admitting: Family Medicine

## 2022-11-30 DIAGNOSIS — J929 Pleural plaque without asbestos: Secondary | ICD-10-CM | POA: Diagnosis not present

## 2022-11-30 DIAGNOSIS — I700 Atherosclerosis of aorta: Secondary | ICD-10-CM | POA: Diagnosis not present

## 2022-11-30 DIAGNOSIS — R9389 Abnormal findings on diagnostic imaging of other specified body structures: Secondary | ICD-10-CM

## 2022-11-30 DIAGNOSIS — K449 Diaphragmatic hernia without obstruction or gangrene: Secondary | ICD-10-CM | POA: Diagnosis not present

## 2022-11-30 DIAGNOSIS — J439 Emphysema, unspecified: Secondary | ICD-10-CM | POA: Diagnosis not present

## 2022-11-30 MED ORDER — IOPAMIDOL (ISOVUE-300) INJECTION 61%
75.0000 mL | Freq: Once | INTRAVENOUS | Status: AC | PRN
  Administered 2022-11-30: 75 mL via INTRAVENOUS

## 2022-11-30 NOTE — Telephone Encounter (Signed)
FYI: Patient's wife called to advise that her husband was able to get an appointment at [facility] on April 17th for the biopsy

## 2022-12-01 NOTE — Telephone Encounter (Signed)
Noted  

## 2022-12-04 ENCOUNTER — Telehealth: Payer: Self-pay | Admitting: Family Medicine

## 2022-12-04 DIAGNOSIS — R918 Other nonspecific abnormal finding of lung field: Secondary | ICD-10-CM

## 2022-12-04 NOTE — Telephone Encounter (Signed)
Please talk to me before calling patient.  I can talk to him if you can get him on the phone.    He has an abnormal CT with a large left upper lung mass along with enlarged lymph nodes that is concerning for lung cancer.  I think he is going to need to see the pulmonary clinic to see if it is possible to get a biopsy done.  I have not yet put in that referral because I want to talk to him first.  I pended the referral below.  Please let me know when you can get him on the phone.  Thanks.

## 2022-12-05 NOTE — Telephone Encounter (Signed)
Called and talked to patient and wife with prev message.  Will ask for pulmonary input.  Referral ordered.

## 2022-12-06 NOTE — Telephone Encounter (Signed)
Please let me know when he can see pulmonary.  Thanks.

## 2022-12-07 DIAGNOSIS — D2239 Melanocytic nevi of other parts of face: Secondary | ICD-10-CM | POA: Diagnosis not present

## 2022-12-07 DIAGNOSIS — L728 Other follicular cysts of the skin and subcutaneous tissue: Secondary | ICD-10-CM | POA: Diagnosis not present

## 2022-12-07 NOTE — Telephone Encounter (Signed)
Many thanks. 

## 2022-12-07 NOTE — Telephone Encounter (Signed)
Spoke with pts wife. Pt is scheduled to see Dr Aundria Rud on 4/25. Due to pt and his wife having other appointments, this was the first date that worked for their schedule.

## 2022-12-12 ENCOUNTER — Telehealth: Payer: Self-pay

## 2022-12-12 DIAGNOSIS — H35423 Microcystoid degeneration of retina, bilateral: Secondary | ICD-10-CM | POA: Diagnosis not present

## 2022-12-12 DIAGNOSIS — H35033 Hypertensive retinopathy, bilateral: Secondary | ICD-10-CM | POA: Diagnosis not present

## 2022-12-12 DIAGNOSIS — H353221 Exudative age-related macular degeneration, left eye, with active choroidal neovascularization: Secondary | ICD-10-CM | POA: Diagnosis not present

## 2022-12-12 DIAGNOSIS — H43813 Vitreous degeneration, bilateral: Secondary | ICD-10-CM | POA: Diagnosis not present

## 2022-12-12 DIAGNOSIS — H353211 Exudative age-related macular degeneration, right eye, with active choroidal neovascularization: Secondary | ICD-10-CM | POA: Diagnosis not present

## 2022-12-12 NOTE — Progress Notes (Signed)
Called patient for blood pressure log as previously discussed with Al Corpus, PharmD. Spoke with patient's wife. She stated the readings have been good, but they have not been documenting. I asked his wife to keep a blood pressure log and I would call back on 12/16/2022. She expressed understanding.  Al Corpus, PharmD notified  Claudina Lick, [place] Clinical Pharmacy Assistant [phone]

## 2022-12-15 ENCOUNTER — Ambulatory Visit: Payer: PPO | Admitting: Student in an Organized Health Care Education/Training Program

## 2022-12-15 ENCOUNTER — Encounter: Payer: Self-pay | Admitting: Student in an Organized Health Care Education/Training Program

## 2022-12-15 VITALS — BP 120/72 | HR 79 | Temp 97.6°F | Ht 66.0 in | Wt 151.6 lb

## 2022-12-15 DIAGNOSIS — R918 Other nonspecific abnormal finding of lung field: Secondary | ICD-10-CM | POA: Diagnosis not present

## 2022-12-15 NOTE — Progress Notes (Signed)
Synopsis: Referred in for lung mass by Joaquim Nam, MD  Assessment & Plan:   1. Lung mass  Patient is presenting for the evaluation of a left lung mass that was noted on CXR and confirmed on recent CT. There is hilar and mediastinal lymph node enlargement on the CT as well. This image is highly suspicious of advanced stage malignancy and I did discuss this with the patient.  I reviewed the imaging with Mr. Betts and his wife, and explained the likelihood of malignancy and the different ways of approaching the mass. We discussed that biopsy could be obtained percutaneously as well as through bronchoscopy. I explained to Mr. Waymire that the risk of anesthesia would be prohibitively high for him, and I think that undergoing moderate sedation for a percutaneous biopsy also poses risk. I also discussed with Mr. Enriquez that risk from chemotherapy would also be prohibitive, and I recommend against it. There is also risk from radiation given his age, especially that the mass is large and the field for radiation would involve plenty of healthy tissue. I did offer him a referral to radiation oncology for an evaluation but he would like to hold off for now.  With shared decision making, we agreed to hold off on further interventions or investigations. He will let us know if he develops symptoms secondary to this. I would recommend consultation to palliative care at that point.  Return if symptoms worsen or fail to improve.  I spent 60 minutes caring for this patient today, including preparing to see the patient, obtaining a medical history , reviewing a separately obtained history, performing a medically appropriate examination and/or evaluation, counseling and educating the patient/family/caregiver, referring and communicating with other health care professionals (not separately reported), documenting clinical information in the electronic health record, and independently interpreting results (not  separately reported/billed) and communicating results to the patient/family/caregiver  Raechel Chute, MD [facility] 12/15/2022 12:47 PM    End of visit medications:  No orders of the defined types were placed in this encounter.    Current Outpatient Medications:    acetaminophen (TYLENOL) 500 MG tablet, Take 1,000 mg by mouth every 6 (six) hours as needed for headache (pain)., Disp: , Rfl:    AFLIBERCEPT IO, Inject into the eye See admin instructions. Right eye injection every 4 weeks, Disp: , Rfl:    Alirocumab (PRALUENT) 75 MG/ML SOAJ, INJECT 75 MG INTO THE SKIN EVERY 14 (FOURTEEN) DAYS., Disp: 2 mL, Rfl: 11   Bevacizumab (AVASTIN IV), Place into the left eye See admin instructions. Left eye injection every 8 weeks, Disp: , Rfl:    cetirizine (ZYRTEC) 10 MG tablet, Take 10 mg by mouth daily as needed for allergies., Disp: , Rfl:    clopidogrel (PLAVIX) 75 MG tablet, TAKE 1 TABLET BY MOUTH EVERY DAY, Disp: 90 tablet, Rfl: 3   diltiazem (CARDIZEM CD) 180 MG 24 hr capsule, TAKE 1 CAPSULE BY MOUTH EVERY DAY, Disp: 90 capsule, Rfl: 2   doxazosin (CARDURA) 1 MG tablet, Take 2 tablets (2 mg total) by mouth 2 (two) times daily., Disp: 120 tablet, Rfl: 1   famotidine (PEPCID) 20 MG tablet, TAKE 1 TABLET (20 MG TOTAL) BY MOUTH 2 (TWO) TIMES DAILY AS NEEDED FOR HEARTBURN OR INDIGESTION., Disp: 180 tablet, Rfl: 3   ferrous sulfate 325 (65 FE) MG tablet, Take 1 tablet (325 mg total) by mouth daily with breakfast., Disp: , Rfl:    fluticasone (FLONASE) 50 MCG/ACT nasal spray,  Place 2 sprays into both nostrils daily as needed for allergies., Disp: 16 g, Rfl: 5   loperamide (IMODIUM) 2 MG capsule, Take 1 capsule (2 mg total) by mouth as needed for diarrhea or loose stools., Disp: 30 capsule, Rfl: 0   Multiple Vitamins-Minerals (OCUVITE ADULT 50+ PO), Take 1 capsule by mouth daily with lunch., Disp: , Rfl:    nitroGLYCERIN (NITROSTAT) 0.4 MG SL tablet, Place 1 tablet (0.4 mg total)  under the tongue every 5 (five) minutes as needed for chest pain (max 3 doses in 15 minutes)., Disp: 25 tablet, Rfl: 0   pantoprazole (PROTONIX) 40 MG tablet, TAKE 1 TABLET BY MOUTH EVERY DAY IN THE MORNING, Disp: 90 tablet, Rfl: 1   Polyethyl Glycol-Propyl Glycol (SYSTANE) 0.4-0.3 % SOLN, Place 1 drop into both eyes 3 (three) times daily as needed (dry eyes)., Disp: , Rfl:    rOPINIRole (REQUIP) 0.5 MG tablet, TAKE 1 TABLET BY MOUTH EVERYDAY AT BEDTIME, Disp: 90 tablet, Rfl: 0   tamsulosin (FLOMAX) 0.4 MG CAPS capsule, TAKE 1 CAPSULE BY MOUTH EVERY DAY, Disp: 90 capsule, Rfl: 3   traMADol (ULTRAM) 50 MG tablet, Take 1 tablet (50 mg total) by mouth every 8 (eight) hours as needed. for pain, Disp: 90 tablet, Rfl: 3   vitamin B-12 (CYANOCOBALAMIN) 1000 MCG tablet, Take 2 tablets (2,000 mcg total) by mouth daily., Disp: , Rfl:    Subjective:   PATIENT ID: [name] GENDER: male DOB: 1925/10/09, MRN: [id number]  Chief Complaint  Patient presents with   pulmonary consult    CT 12/02/2022-prod cough with white sputum in the morning.     HPI  [name] is a very pleasant 89 year old male presenting to clinic for the evaluation of left lung mass noted on imaging.  He reports being in his usual state of health. He has no symptoms at the moment. No chest pain, no chest tightness, no wheezing, and no pleurisy reported. He has no weight loss and is overall in good health. He feels mentally sharp and reports some vision changes related to age. He is accompanied by his wife.  He has a history of HTN, CAD, macular degeneration, PUD, and AVM all medically managed. He has had a chest xray that showed hilar prominence. This was followed by a chest CT on 11/30/2022 showing a large left upper lobe mass with adenopathy extending to the contralateral hilum.  Patient has a history of smoking, quit in the early 80's, and smoked for 35 years in total. 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  Ancillary information including prior medications, full medical/surgical/family/social histories, and PFTs (when available) are listed below and have been reviewed.   Review of Systems  Constitutional:  Negative for chills, fever, malaise/fatigue and weight loss.  Respiratory:  Negative for cough, hemoptysis, sputum production, shortness of breath and wheezing.   Cardiovascular:  Negative for chest pain.     Objective:   Vitals:   12/15/22 1131  BP: 120/72  Pulse: 79  Temp: 97.6 F (36.4 C)  TempSrc: Temporal  SpO2: 93%  Weight: 151 lb 9.6 oz (68.8 kg)  Height: 5\' 6"  (1.676 m)   93% on RA BMI Readings from Last 3 Encounters:  12/15/22 24.47 kg/m  11/28/22 23.95 kg/m  11/24/22 24.13 kg/m   Wt Readings from Last 3 Encounters:  12/15/22 151 lb 9.6 oz (68.8 kg)  11/28/22 148 lb 6 oz (67.3 kg)  11/24/22 149 lb 8 oz (67.8  kg)    Physical Exam Constitutional:      Appearance: Normal appearance.  HENT:     Head: Normocephalic.     Mouth/Throat:     Mouth: Mucous membranes are moist.  Cardiovascular:     Rate and Rhythm: Normal rate and regular rhythm.     Pulses: Normal pulses.     Heart sounds: Normal heart sounds.  Pulmonary:     Effort: Pulmonary effort is normal.     Breath sounds: Normal breath sounds.  Abdominal:     Palpations: Abdomen is soft.  Neurological:     General: No focal deficit present.     Mental Status: He is alert and oriented to person, place, and time. Mental status is at baseline.       Ancillary Information    Past Medical History:  Diagnosis Date   Back pain    improved with gabapentin as of 2015   Barrett esophagus 09/2003   Coronary artery calcification seen on CT scan    COVID-19    Diverticulitis    Diverticulosis    GERD (gastroesophageal reflux disease)    GI AVM (gastrointestinal arteriovenous vascular malformation)    Hearing loss    Hemorrhoids    History of Holter monitoring    a. holter 9/17:  NSR, PVCs; no sustained arrhythmia; PVC burden 8%   History of nuclear stress test    a. Myoview 8/17: Intermediate risk, inferior ischemia, not gated   History of prostatitis    History of stroke    Hyperlipidemia    Hypertension    Macular degeneration, wet    receiving intra-ocular injections ([facility])   PUD (peptic ulcer disease)      Family History  Problem Relation Age of Onset   Heart attack Father    Coronary artery disease Father    Hypertension Mother    Pancreatic cancer Mother        Died at 95.   Cancer Mother        Pancreatic cancer   Arthritis Sister    Hyperlipidemia Sister    Prostate cancer Brother    Cancer Brother        Bone Cancer secondary to Prostate Cancer   Bleeding Disorder Son    Diabetes Neg Hx      Past Surgical History:  Procedure Laterality Date   EP IMPLANTABLE DEVICE N/A 12/24/2015   Procedure: Loop Recorder Insertion;  Surgeon: Hillis Range, MD;  Location: [facility] INVASIVE CV LAB;  Service: Cardiovascular;  Laterality: N/A;   INGUINAL HERNIA REPAIR  2001   LAPAROSCOPIC APPENDECTOMY N/A 07/07/2014   Procedure: APPENDECTOMY LAPAROSCOPIC;  Surgeon: Manus Rudd, MD;  Location: [facility] OR;  Service: General;  Laterality: N/A;   TEE WITHOUT CARDIOVERSION N/A 10/28/2015   Procedure: TRANSESOPHAGEAL ECHOCARDIOGRAM (TEE);  Surgeon: Laurey Morale, MD;  Location: [facility] ENDOSCOPY;  Service: Cardiovascular;  Laterality: N/A;    Social History   Socioeconomic History   Marital status: Married    Spouse name: Cletis Clack   Number of children: Not on file   Years of education: Not on file   Highest education level: Not on file  Occupational History   Occupation: Retired    Associate Professor: RETIRED  Tobacco Use   Smoking status: Former    Packs/day: 0.00    Years: 36.00    Additional pack years: 0.00    Total pack years: 0.00    Types: Cigarettes    Quit date: 1983    Years  since quitting: 41.3   Smokeless tobacco: Never  Vaping Use   Vaping Use: Never used   Substance and Sexual Activity   Alcohol use: No    Alcohol/week: 0.0 standard drinks of alcohol   Drug use: No   Sexual activity: Not on file  Other Topics Concern   Not on file  Social History Narrative   6th grade. Married 01-16-1951 1 son, 1 dtr - died at 5 months. Work - Management consultant, Engineer, drilling, Catering manager.   Retired-fulltime. "[employer]". ACP - yes DNR, yes short-term mechanical ventilarion, no heroic or futile measures in face of irreversible.   Social Determinants of Health   Financial Resource Strain: Low Risk  (01/31/2022)   Overall Financial Resource Strain (CARDIA)    Difficulty of Paying Living Expenses: Not hard at all  Food Insecurity: No Food Insecurity (01/31/2022)   Hunger Vital Sign    Worried About Running Out of Food in the Last Year: Never true    Ran Out of Food in the Last Year: Never true  Transportation Needs: No Transportation Needs (01/31/2022)   PRAPARE - Administrator, Civil Service (Medical): No    Lack of Transportation (Non-Medical): No  Physical Activity: Sufficiently Active (01/31/2022)   Exercise Vital Sign    Days of Exercise per Week: 3 days    Minutes of Exercise per Session: 60 min  Stress: No Stress Concern Present (01/31/2022)   [employer] of Occupational Health - Occupational Stress Questionnaire    Feeling of Stress : Not at all  Social Connections: Moderately Isolated (01/31/2022)   Social Connection and Isolation Panel [NHANES]    Frequency of Communication with Friends and Family: More than three times a week    Frequency of Social Gatherings with Friends and Family: Once a week    Attends Religious Services: Never    Database administrator or Organizations: No    Attends Banker Meetings: Never    Marital Status: Married  Catering manager Violence: Not At Risk (01/31/2022)   Humiliation, Afraid, Rape, and Kick questionnaire    Fear of Current or Ex-Partner: No    Emotionally Abused: No    Physically  Abused: No    Sexually Abused: No     Allergies  Allergen Reactions   Angiotensin Receptor Blockers Swelling    Lip and face swelling prev noted   Atorvastatin Other (See Comments)    REACTION: muscles tense up    Codeine Nausea And Vomiting   Lisinopril Swelling and Other (See Comments)    Lips and face swelling   Statins Other (See Comments)    Muscle tense up   Gabapentin Other (See Comments)    Dizzy sensation     CBC    Component Value Date/Time   WBC 8.0 11/24/2022 1157   RBC 4.93 11/24/2022 1157   HGB 14.6 11/24/2022 1157   HGB 16.9 05/23/2006 1300   HCT 43.9 11/24/2022 1157   HCT 49.9 05/23/2006 1300   PLT 284.0 11/24/2022 1157   PLT 256 05/23/2006 1300   MCV 89.2 11/24/2022 1157   MCV 86.3 05/23/2006 1300   MCH 29.9 10/28/2022 1640   MCHC 33.3 11/24/2022 1157   RDW 13.7 11/24/2022 1157   RDW 13.7 05/23/2006 1300   LYMPHSABS 1.2 11/24/2022 1157   LYMPHSABS 1.1 05/23/2006 1300   MONOABS 0.5 11/24/2022 1157   MONOABS 0.6 05/23/2006 1300   EOSABS 0.2 11/24/2022 1157   EOSABS 0.1 05/23/2006 1300  BASOSABS 0.0 11/24/2022 1157   BASOSABS 0.0 05/23/2006 1300    Pulmonary Functions Testing Results:     No data to display          Outpatient Medications Prior to Visit  Medication Sig Dispense Refill   acetaminophen (TYLENOL) 500 MG tablet Take 1,000 mg by mouth every 6 (six) hours as needed for headache (pain).     AFLIBERCEPT IO Inject into the eye See admin instructions. Right eye injection every 4 weeks     Alirocumab (PRALUENT) 75 MG/ML SOAJ INJECT 75 MG INTO THE SKIN EVERY 14 (FOURTEEN) DAYS. 2 mL 11   Bevacizumab (AVASTIN IV) Place into the left eye See admin instructions. Left eye injection every 8 weeks     cetirizine (ZYRTEC) 10 MG tablet Take 10 mg by mouth daily as needed for allergies.     clopidogrel (PLAVIX) 75 MG tablet TAKE 1 TABLET BY MOUTH EVERY DAY 90 tablet 3   diltiazem (CARDIZEM CD) 180 MG 24 hr capsule TAKE 1 CAPSULE BY MOUTH EVERY  DAY 90 capsule 2   doxazosin (CARDURA) 1 MG tablet Take 2 tablets (2 mg total) by mouth 2 (two) times daily. 120 tablet 1   famotidine (PEPCID) 20 MG tablet TAKE 1 TABLET (20 MG TOTAL) BY MOUTH 2 (TWO) TIMES DAILY AS NEEDED FOR HEARTBURN OR INDIGESTION. 180 tablet 3   ferrous sulfate 325 (65 FE) MG tablet Take 1 tablet (325 mg total) by mouth daily with breakfast.     fluticasone (FLONASE) 50 MCG/ACT nasal spray Place 2 sprays into both nostrils daily as needed for allergies. 16 g 5   loperamide (IMODIUM) 2 MG capsule Take 1 capsule (2 mg total) by mouth as needed for diarrhea or loose stools. 30 capsule 0   Multiple Vitamins-Minerals (OCUVITE ADULT 50+ PO) Take 1 capsule by mouth daily with lunch.     nitroGLYCERIN (NITROSTAT) 0.4 MG SL tablet Place 1 tablet (0.4 mg total) under the tongue every 5 (five) minutes as needed for chest pain (max 3 doses in 15 minutes). 25 tablet 0   pantoprazole (PROTONIX) 40 MG tablet TAKE 1 TABLET BY MOUTH EVERY DAY IN THE MORNING 90 tablet 1   Polyethyl Glycol-Propyl Glycol (SYSTANE) 0.4-0.3 % SOLN Place 1 drop into both eyes 3 (three) times daily as needed (dry eyes).     rOPINIRole (REQUIP) 0.5 MG tablet TAKE 1 TABLET BY MOUTH EVERYDAY AT BEDTIME 90 tablet 0   tamsulosin (FLOMAX) 0.4 MG CAPS capsule TAKE 1 CAPSULE BY MOUTH EVERY DAY 90 capsule 3   traMADol (ULTRAM) 50 MG tablet Take 1 tablet (50 mg total) by mouth every 8 (eight) hours as needed. for pain 90 tablet 3   vitamin B-12 (CYANOCOBALAMIN) 1000 MCG tablet Take 2 tablets (2,000 mcg total) by mouth daily.     doxycycline (VIBRA-TABS) 100 MG tablet Take 1 tablet (100 mg total) by mouth 2 (two) times daily. 20 tablet 0   No facility-administered medications prior to visit.

## 2022-12-20 ENCOUNTER — Other Ambulatory Visit: Payer: Self-pay | Admitting: Family Medicine

## 2023-01-04 DIAGNOSIS — Z961 Presence of intraocular lens: Secondary | ICD-10-CM | POA: Diagnosis not present

## 2023-01-06 ENCOUNTER — Telehealth: Payer: Self-pay | Admitting: Family Medicine

## 2023-01-06 NOTE — Telephone Encounter (Signed)
Called and talked to his wife.  He has no new resp sx.  He has some days that are better than others, has been working in the garden depending on the weather.  D/w her about his situation.  They can update me as needed.  He wasn't enthused about treatment so biopsy wouldn't change the plan and observation appears to be the most reasonable course at this point.  I thanked her for taking the call and she thanked me for calling.  I thank all involved in this gentleman's care.

## 2023-01-19 DIAGNOSIS — H04123 Dry eye syndrome of bilateral lacrimal glands: Secondary | ICD-10-CM | POA: Diagnosis not present

## 2023-01-19 DIAGNOSIS — I10 Essential (primary) hypertension: Secondary | ICD-10-CM | POA: Diagnosis not present

## 2023-01-19 DIAGNOSIS — E785 Hyperlipidemia, unspecified: Secondary | ICD-10-CM | POA: Diagnosis not present

## 2023-01-19 DIAGNOSIS — H3530 Unspecified macular degeneration: Secondary | ICD-10-CM | POA: Diagnosis not present

## 2023-01-19 DIAGNOSIS — G20C Parkinsonism, unspecified: Secondary | ICD-10-CM | POA: Diagnosis not present

## 2023-01-19 DIAGNOSIS — H9190 Unspecified hearing loss, unspecified ear: Secondary | ICD-10-CM | POA: Diagnosis not present

## 2023-01-19 DIAGNOSIS — I499 Cardiac arrhythmia, unspecified: Secondary | ICD-10-CM | POA: Diagnosis not present

## 2023-01-19 DIAGNOSIS — D509 Iron deficiency anemia, unspecified: Secondary | ICD-10-CM | POA: Diagnosis not present

## 2023-01-19 DIAGNOSIS — G8929 Other chronic pain: Secondary | ICD-10-CM | POA: Diagnosis not present

## 2023-01-19 DIAGNOSIS — J309 Allergic rhinitis, unspecified: Secondary | ICD-10-CM | POA: Diagnosis not present

## 2023-01-19 DIAGNOSIS — I2089 Other forms of angina pectoris: Secondary | ICD-10-CM | POA: Diagnosis not present

## 2023-01-19 DIAGNOSIS — E539 Vitamin B deficiency, unspecified: Secondary | ICD-10-CM | POA: Diagnosis not present

## 2023-01-24 ENCOUNTER — Telehealth: Payer: Self-pay | Admitting: Family Medicine

## 2023-01-24 NOTE — Telephone Encounter (Signed)
Leonia Reeves, Nurse Practitioner with [facility] called to report to Dr. Para March that she had a house call with the pt last week. Leonia Reeves stated she was told by the pt's wife, that about 3 weeks ago, the pt fell backwards. Mandesia said the pt's wife stated their son was able to catch the pt before falling too hard, but the pt did fall on his behind. Mandesia stated she was told there wasn't any injuries. Mandesia asked if Dr. Para March can prescribe or submit a order for a cane for the pt? Mandesia stated the pt is open to having a cane. No call back # was left, call came through as "anonymous".

## 2023-01-25 NOTE — Telephone Encounter (Signed)
Please verify with patient, then send the DME order for a cane.  Dx R26.81.  thanks.

## 2023-01-26 NOTE — Telephone Encounter (Signed)
Called and spoke with patinents wife. She stated she didn't remember in the conversation with the NP from [facility] discussing they wanted to order a cane. She advised she has one at home that she will let patient try out and if he wants to use one then she will let us know to order.

## 2023-02-04 ENCOUNTER — Encounter (HOSPITAL_COMMUNITY): Payer: Self-pay

## 2023-02-04 ENCOUNTER — Emergency Department (HOSPITAL_COMMUNITY)
Admission: EM | Admit: 2023-02-04 | Discharge: 2023-02-04 | Disposition: A | Discharge status: Home / Self Care | Payer: PPO | Attending: Emergency Medicine | Admitting: Emergency Medicine

## 2023-02-04 ENCOUNTER — Emergency Department (HOSPITAL_COMMUNITY): Payer: PPO

## 2023-02-04 DIAGNOSIS — Z79899 Other long term (current) drug therapy: Secondary | ICD-10-CM | POA: Insufficient documentation

## 2023-02-04 DIAGNOSIS — I10 Essential (primary) hypertension: Secondary | ICD-10-CM | POA: Insufficient documentation

## 2023-02-04 DIAGNOSIS — K5900 Constipation, unspecified: Secondary | ICD-10-CM | POA: Insufficient documentation

## 2023-02-04 DIAGNOSIS — I2510 Atherosclerotic heart disease of native coronary artery without angina pectoris: Secondary | ICD-10-CM | POA: Insufficient documentation

## 2023-02-04 DIAGNOSIS — I700 Atherosclerosis of aorta: Secondary | ICD-10-CM | POA: Diagnosis not present

## 2023-02-04 DIAGNOSIS — R109 Unspecified abdominal pain: Secondary | ICD-10-CM | POA: Diagnosis not present

## 2023-02-04 DIAGNOSIS — R1084 Generalized abdominal pain: Secondary | ICD-10-CM | POA: Diagnosis not present

## 2023-02-04 DIAGNOSIS — R0902 Hypoxemia: Secondary | ICD-10-CM | POA: Diagnosis not present

## 2023-02-04 DIAGNOSIS — R1032 Left lower quadrant pain: Secondary | ICD-10-CM | POA: Diagnosis present

## 2023-02-04 DIAGNOSIS — Z7902 Long term (current) use of antithrombotics/antiplatelets: Secondary | ICD-10-CM | POA: Insufficient documentation

## 2023-02-04 LAB — CBC WITH DIFFERENTIAL/PLATELET
Abs Immature Granulocytes: 0.04 10*3/uL (ref 0.00–0.07)
Basophils Absolute: 0.1 10*3/uL (ref 0.0–0.1)
Basophils Relative: 1 %
Eosinophils Absolute: 0.2 10*3/uL (ref 0.0–0.5)
Eosinophils Relative: 3 %
HCT: 42.4 % (ref 39.0–52.0)
Hemoglobin: 13.7 g/dL (ref 13.0–17.0)
Immature Granulocytes: 1 %
Lymphocytes Relative: 16 %
Lymphs Abs: 1.5 10*3/uL (ref 0.7–4.0)
MCH: 29.3 pg (ref 26.0–34.0)
MCHC: 32.3 g/dL (ref 30.0–36.0)
MCV: 90.6 fL (ref 80.0–100.0)
Monocytes Absolute: 0.8 10*3/uL (ref 0.1–1.0)
Monocytes Relative: 9 %
Neutro Abs: 6.3 10*3/uL (ref 1.7–7.7)
Neutrophils Relative %: 70 %
Platelets: 263 10*3/uL (ref 150–400)
RBC: 4.68 MIL/uL (ref 4.22–5.81)
RDW: 12.7 % (ref 11.5–15.5)
WBC: 8.9 10*3/uL (ref 4.0–10.5)
nRBC: 0 % (ref 0.0–0.2)

## 2023-02-04 LAB — COMPREHENSIVE METABOLIC PANEL
ALT: 15 U/L (ref 0–44)
AST: 22 U/L (ref 15–41)
Albumin: 3.2 g/dL — ABNORMAL LOW (ref 3.5–5.0)
Alkaline Phosphatase: 79 U/L (ref 38–126)
Anion gap: 9 (ref 5–15)
BUN: 15 mg/dL (ref 8–23)
CO2: 24 mmol/L (ref 22–32)
Calcium: 8.6 mg/dL — ABNORMAL LOW (ref 8.9–10.3)
Chloride: 103 mmol/L (ref 98–111)
Creatinine, Ser: 1.05 mg/dL (ref 0.61–1.24)
GFR, Estimated: >60 mL/min (ref >60)
Glucose, Bld: 102 mg/dL — ABNORMAL HIGH (ref 70–99)
Potassium: 3.7 mmol/L (ref 3.5–5.1)
Sodium: 136 mmol/L (ref 135–145)
Total Bilirubin: 0.5 mg/dL (ref 0.3–1.2)
Total Protein: 6.6 g/dL (ref 6.5–8.1)

## 2023-02-04 LAB — LIPASE, BLOOD: Lipase: 23 U/L (ref 11–51)

## 2023-02-04 MED ORDER — IOHEXOL 350 MG/ML SOLN
75.0000 mL | Freq: Once | INTRAVENOUS | Status: AC | PRN
  Administered 2023-02-04: 75 mL via INTRAVENOUS

## 2023-02-04 NOTE — ED Triage Notes (Addendum)
Pt BIB [facility] from home for c/o left lower abdominal pain. Pt took tramadol PTA. Denies pain at this time. Reports he takes a nausea medication to help with his abdominal pain. Pt is lethargic, easily aroused  BP 122/88 HR 80 RR 14 CBG 137 97%

## 2023-02-04 NOTE — Discharge Instructions (Signed)
We recommend Miralax for constipation management. Take Tylenol for pain, as needed. Follow up with your primary care doctor.

## 2023-02-04 NOTE — ED Provider Notes (Signed)
[place] EMERGENCY DEPARTMENT AT [facility] Provider Note   CSN: [id number] Arrival date & time: 02/04/23  0750     History  Chief Complaint  Patient presents with   Abdominal Pain    MARKEVIS MENZEL is a 89 y.o. male.  89 year old male with a history of hypertension, hyperlipidemia, CAD presents to the emergency department for evaluation of left lower quadrant abdominal pain.  Took tramadol for symptoms prior to arrival.  Does not have any complaints of pain at this time.  Presently denies nausea.  He has not had any vomiting.  Believes he had a bowel movement yesterday.  No recent fevers.  Abdominal surgical history notable for laparoscopic appendectomy as well as inguinal hernia repair.  Of note, had CT chest in April which noted findings c/w stage IIIC primary bronchogenic carcinoma. Recommendations were for palliative care consult given that other treatment options would be prohibitively high risk for him given his age.   Abdominal Pain      Home Medications Prior to Admission medications   Medication Sig Start Date End Date Taking? Authorizing Provider  acetaminophen (TYLENOL) 500 MG tablet Take 1,000 mg by mouth every 6 (six) hours as needed for headache (pain).    [provider]  AFLIBERCEPT IO Inject into the eye See admin instructions. Right eye injection every 4 weeks    [provider]  Alirocumab (PRALUENT) 75 MG/ML SOAJ INJECT 75 MG INTO THE SKIN EVERY 14 (FOURTEEN) DAYS. 03/04/22   Corky Crafts, MD  Bevacizumab (AVASTIN IV) Place into the left eye See admin instructions. Left eye injection every 8 weeks    [provider]  cetirizine (ZYRTEC) 10 MG tablet Take 10 mg by mouth daily as needed for allergies.    [provider]  clopidogrel (PLAVIX) 75 MG tablet TAKE 1 TABLET BY MOUTH EVERY DAY 10/10/22   Joaquim Nam, MD  diltiazem (CARDIZEM CD) 180 MG 24 hr capsule TAKE 1 CAPSULE BY MOUTH EVERY DAY 11/07/22    Joaquim Nam, MD  doxazosin (CARDURA) 1 MG tablet TAKE 2 TABLETS (2 MG TOTAL) BY MOUTH 2 (TWO) TIMES DAILY. 12/21/22   Joaquim Nam, MD  famotidine (PEPCID) 20 MG tablet TAKE 1 TABLET (20 MG TOTAL) BY MOUTH 2 (TWO) TIMES DAILY AS NEEDED FOR HEARTBURN OR INDIGESTION. 11/28/22   Joaquim Nam, MD  ferrous sulfate 325 (65 FE) MG tablet Take 1 tablet (325 mg total) by mouth daily with breakfast. 04/05/17   Joaquim Nam, MD  fluticasone [facility]) 50 MCG/ACT nasal spray Place 2 sprays into both nostrils daily as needed for allergies. 08/21/15   Joaquim Nam, MD  loperamide (IMODIUM) 2 MG capsule Take 1 capsule (2 mg total) by mouth as needed for diarrhea or loose stools. 09/22/20   Rolly Salter, MD  Multiple Vitamins-Minerals (OCUVITE ADULT 50+ PO) Take 1 capsule by mouth daily with lunch.    [provider]  nitroGLYCERIN (NITROSTAT) 0.4 MG SL tablet Place 1 tablet (0.4 mg total) under the tongue every 5 (five) minutes as needed for chest pain (max 3 doses in 15 minutes). 03/26/18   Karie Schwalbe, MD  pantoprazole (PROTONIX) 40 MG tablet TAKE 1 TABLET BY MOUTH EVERY DAY IN THE MORNING 07/11/22   Joaquim Nam, MD  Polyethyl Glycol-Propyl Glycol (SYSTANE) 0.4-0.3 % SOLN Place 1 drop into both eyes 3 (three) times daily as needed (dry eyes).    [provider]  rOPINIRole (REQUIP)  0.5 MG tablet TAKE 1 TABLET BY MOUTH EVERYDAY AT BEDTIME 11/28/22   Joaquim Nam, MD  tamsulosin (FLOMAX) 0.4 MG CAPS capsule TAKE 1 CAPSULE BY MOUTH EVERY DAY 02/19/19   Joaquim Nam, MD  traMADol (ULTRAM) 50 MG tablet Take 1 tablet (50 mg total) by mouth every 8 (eight) hours as needed. for pain 09/09/22   Joaquim Nam, MD  vitamin B-12 (CYANOCOBALAMIN) 1000 MCG tablet Take 2 tablets (2,000 mcg total) by mouth daily. 01/28/16   Joaquim Nam, MD      Allergies    Angiotensin receptor blockers, Atorvastatin, Codeine, Lisinopril, Statins, and Gabapentin    Review of Systems    Review of Systems  Gastrointestinal:  Positive for abdominal pain.  Ten systems reviewed and are negative for acute change, except as noted in the HPI.    Physical Exam Updated Vital Signs BP (!) 172/91   Pulse 79   Temp 97.8 F (36.6 C) (Oral)   Resp 15   SpO2 93%   Physical Exam Vitals and nursing note reviewed.  Constitutional:      General: He is not in acute distress.    Appearance: He is not diaphoretic.     Comments: Elderly, frail. Hard of hearing.  HENT:     Head: Normocephalic and atraumatic.  Eyes:     General: No scleral icterus.    Conjunctiva/sclera: Conjunctivae normal.  Cardiovascular:     Rate and Rhythm: Normal rate and regular rhythm.     Pulses: Normal pulses.  Pulmonary:     Effort: Pulmonary effort is normal. No respiratory distress.     Breath sounds: No stridor. No wheezing.     Comments: Respirations even and unlabored Abdominal:     Comments: Bowel sounds in all quadrants. TTP in the LLQ and LUQ with voluntary guarding. Mild distension without peritoneal signs.  Musculoskeletal:        General: Normal range of motion.     Cervical back: Normal range of motion.  Skin:    General: Skin is warm and dry.     Coloration: Skin is not pale.     Findings: No erythema or rash.  Neurological:     Mental Status: He is alert and oriented to person, place, and time.     Coordination: Coordination normal.  Psychiatric:        Behavior: Behavior normal.     ED Results / Procedures / Treatments   Labs (all labs ordered are listed, but only abnormal results are displayed) Labs Reviewed  COMPREHENSIVE METABOLIC PANEL - Abnormal; Notable for the following components:      Result Value   Glucose, Bld 102 (*)    Calcium 8.6 (*)    Albumin 3.2 (*)    All other components within normal limits  CBC WITH DIFFERENTIAL/PLATELET  LIPASE, BLOOD    EKG None  Radiology CT ABDOMEN PELVIS W CONTRAST  Result Date: 02/04/2023 CLINICAL DATA:  LLQ abdominal  pain * Tracking Code: BO * EXAM: CT ABDOMEN AND PELVIS WITH CONTRAST TECHNIQUE: Multidetector CT imaging of the abdomen and pelvis was performed using the standard protocol following bolus administration of intravenous contrast. RADIATION DOSE REDUCTION: This exam was performed according to the departmental dose-optimization program which includes automated exposure control, adjustment of the mA and/or kV according to patient size and/or use of iterative reconstruction technique. CONTRAST:  75mL OMNIPAQUE IOHEXOL 350 MG/ML SOLN COMPARISON:  January 22, 2021, November 30, 2022 FINDINGS: Lower chest: Trace LEFT  greater than RIGHT pleural effusions. Known LEFT upper lobe mass is outside of the field of view. Paraseptal emphysema. In Hepatobiliary: No new suspicious hepatic lesion. Gallbladder is distended without ancillary evidence of acute cholecystitis. Common bile duct is mildly prominent 9 mm which is technically within normal limits for age and minimally increased since prior CT in 2022 where it measured 7 mm. No discrete choledocholithiasis are visualized. Pancreas: Atrophic appearance of the pancreas. The scattered coarse calcifications consistent with sequela of remote prior pancreatitis versus granulomatous infection. These are unchanged in comparison to prior. No pancreatic ductal dilation. Spleen: Scattered punctate calcifications consistent with sequela of remote prior granulomatous infection. Adrenals/Urinary Tract: Adrenal glands are unremarkable. Kidneys enhance symmetrically. No hydronephrosis or obstructing nephrolithiasis. Bladder is unremarkable. Stomach/Bowel: No evidence of bowel obstruction. There is moderate to large colonic stool burden throughout the colon. Status post appendectomy. Scattered diverticulosis without evidence of acute diverticulitis. Large hiatal hernia containing the near entirety of the stomach. Duodenal diverticulum. Vascular/Lymphatic: Atherosclerotic calcifications throughout the  aorta. Similar appearance of several prominent lymph nodes adjacent to the distal esophagus measuring up to 6 mm in the short axis (series 6, image 1). No intra-abdominal lymphadenopathy identified. Reproductive: Heterogeneous and mildly enlarged prostate. Other: Fat containing LEFT inguinal hernia.  No free fluid. Musculoskeletal: Degenerative changes of the thoracolumbar spine. Underlying osteopenia. No discrete aggressive osseous lesions are identified. IMPRESSION: 1. No acute CT findings to explain patient's symptoms. Similar appearance of small fat containing LEFT inguinal hernia. 2. There is moderate to large colonic stool burden. 3. Minimal increase in size of the common bile duct to 9 mm. Recommend correlation with liver function tests. 4. Trace LEFT greater than RIGHT pleural effusions. 5. Known LEFT upper lobe mass is outside of the field of view. Similar appearance of prominent lymph nodes adjacent to the distal esophagus. Aortic Atherosclerosis (ICD10-I70.0) and Emphysema (ICD10-J43.9). Electronically Signed   By: Meda Klinefelter M.D.   On: 02/04/2023 10:23    Procedures Procedures    Medications Ordered in ED Medications  iohexol (OMNIPAQUE) 350 MG/ML injection 75 mL (75 mLs Intravenous Contrast Given 02/04/23 0954)    ED Course/ Medical Decision Making/ A&P Clinical Course as of 02/04/23 1215  Sat Feb 04, 2023  0955 Spoke with wife at bedside and updated on labs, CT imaging plan. All questions answered. [KH]    Clinical Course User Index [KH] Antony Madura, PA-C                             Medical Decision Making Amount and/or Complexity of Data Reviewed Labs: ordered. Radiology: ordered.  Risk Prescription drug management.   This patient presents to the ED for concern of LLQ abdominal pain, this involves an extensive number of treatment options, and is a complaint that carries with it a high risk of complications and morbidity.  The differential diagnosis includes  diverticulitis vs kidney stone vs constipation vs pSBO/SBO vs carcinomatosis   Co morbidities that complicate the patient evaluation  HTN CAD   Additional history obtained:  Additional history obtained from EMS upon arrival and spouse, at bedside. External records from outside source obtained and reviewed including CT chest w/contrast from April 2024 notable for findings c/w bronchogenic carcinoma. Reviewed outpatient pulmonary visit recommendations for palliative treatment.   Lab Tests:  I Ordered, and personally interpreted labs.  The pertinent results include:  Reassuring CBC, CMP, lipase   Imaging Studies ordered:  I ordered imaging studies  including CT abdomen/pelvis  I independently visualized and interpreted imaging which showed stable L inguinal hernia containing fat as well as constipation I agree with the radiologist interpretation   Cardiac Monitoring:  The patient was maintained on a cardiac monitor.  I personally viewed and interpreted the cardiac monitored which showed an underlying rhythm of: NSR   Medicines ordered and prescription drug management:  I have reviewed the patients home medicines and have made adjustments as needed   Test Considered:  CT chest given hx of carcinoma; however, no c/o chest pain.   Problem List / ED Course:  As above Imaging negative for infectious etiology as well as bowel obstruction, ruptured viscous. No evidence of ureterolithiasis or hydronephrosis. No significant peritoneal carcinomatosis in the setting of presumed bronchogenic carcinoma.    Reevaluation:  After the interventions noted above, I reevaluated the patient and found that they have :stayed the same   Social Determinants of Health:  Lives independently Ambulatory with assist device   Dispostion:  Considered admission, but after review of the diagnostic results and the patients response to treatment, I feel that the patent would benefit from  outpatient bowel regimen for management of constipation as well as close PCP follow up. Work up in the ED today has been reassuring. Patient seen and examined by my attending, MD [facility]. Return precautions discussed and provided. Patient discharged in stable condition with no unaddressed concerns.          Final Clinical Impression(s) / ED Diagnoses Final diagnoses:  Constipation, unspecified constipation type    Rx / DC Orders ED Discharge Orders     None         Antony Madura, PA-C 02/04/23 1216    Cathren Laine, MD 02/04/23 [phone]

## 2023-02-10 ENCOUNTER — Telehealth: Payer: Self-pay | Admitting: *Deleted

## 2023-02-10 NOTE — Telephone Encounter (Signed)
Transition Care Management Unsuccessful Follow-up Telephone Call  Date of discharge and from where:  [place] ed 02/05/2023  Attempts:  1st Attempt  Reason for unsuccessful TCM follow-up call:  Left voice message

## 2023-02-10 NOTE — Telephone Encounter (Signed)
Transition Care Management Unsuccessful Follow-up Telephone Call  Date of discharge and from where:  [place] ed 02/04/2023  Attempts:  2nd Attempt  Reason for unsuccessful TCM follow-up call:  busy

## 2023-02-14 ENCOUNTER — Encounter: Payer: Self-pay | Admitting: Family Medicine

## 2023-02-14 ENCOUNTER — Ambulatory Visit (INDEPENDENT_AMBULATORY_CARE_PROVIDER_SITE_OTHER): Payer: PPO | Admitting: Family Medicine

## 2023-02-14 VITALS — BP 122/60 | HR 74 | Temp 97.9°F | Ht 66.0 in | Wt 150.0 lb

## 2023-02-14 DIAGNOSIS — K5900 Constipation, unspecified: Secondary | ICD-10-CM

## 2023-02-14 DIAGNOSIS — R9389 Abnormal findings on diagnostic imaging of other specified body structures: Secondary | ICD-10-CM

## 2023-02-14 DIAGNOSIS — M25519 Pain in unspecified shoulder: Secondary | ICD-10-CM | POA: Diagnosis not present

## 2023-02-14 MED ORDER — DOXAZOSIN MESYLATE 1 MG PO TABS: 1.0000 mg | ORAL_TABLET | Freq: Two times a day (BID) | ORAL | Status: DC

## 2023-02-14 MED ORDER — POLYETHYLENE GLYCOL 3350 17 GM/SCOOP PO POWD: 17.0000 g | Freq: Every day | ORAL | Status: DC | PRN

## 2023-02-14 NOTE — Patient Instructions (Signed)
Let me know if you don't get a call about PT at home for your shoulder.  Keep taking miralax daily if you haven't had a BM that day.  Try to take extra protein with each meal.  Let me know if you notice changes in the meantime.  Take care.  Glad to see you.

## 2023-02-14 NOTE — Progress Notes (Unsigned)
ER f/u.   IMPRESSION: 1. No acute CT findings to explain patient's symptoms. Similar appearance of small fat containing LEFT inguinal hernia. 2. There is moderate to large colonic stool burden. 3. Minimal increase in size of the common bile duct to 9 mm. Recommend correlation with liver function tests. 4. Trace LEFT greater than RIGHT pleural effusions. 5. Known LEFT upper lobe mass is outside of the field of view. Similar appearance of prominent lymph nodes adjacent to the distal esophagus. ======================= ER eval for LLQ pain.  Pain resolved after BM.  No pain now.  BM today.  Discussed as needed MiraLAX use, if he has not had a bowel movement that day.Taking tramadol at baseline.  Still on iron.  No pain but feels weak.    R shoulder pain on ROM.  R>L sx.  Pain with overhead movement.  Pain sleeping on the R side.  No pain with arm swing.    Discussed previous lung abnormality with presumed cancer diagnosis, but also discussed that we do not have a definitive diagnosis without a tissue sample/biopsy.  Discussed that I did expect him to have cancer, but we also discussed that a biopsy may be prohibitively risky.  He would not want to go through a biopsy in the first place and even if he did have a biopsy that showed cancer, he stated that his preference was not to go through treatment.  He has thought about his options and he can clearly express his preferences.  I support his decision making process and his decision.  He is not short of breath.  We talked about adequate protein intake to maintain his muscle mass/strength.  Meds, vitals, and allergies reviewed.   ROS: Per HPI unless specifically indicated in ROS section   GEN: nad, alert and oriented HEENT: ncat NECK: supple w/o LA CV: rrr.  PULM: ctab, no inc wob, no focal decrease in breath sounds.  No wheeze. ABD: soft, +bs EXT: no edema SKIN: no acute rash Right shoulder with pain on internal and external rotation.  Grip  is still normal.  40 minutes were devoted to patient care in this encounter (this includes time spent reviewing the patient's file/history, interviewing and examining the patient, counseling/reviewing plan with patient).

## 2023-02-15 DIAGNOSIS — K5900 Constipation, unspecified: Secondary | ICD-10-CM | POA: Insufficient documentation

## 2023-02-15 DIAGNOSIS — M25519 Pain in unspecified shoulder: Secondary | ICD-10-CM | POA: Insufficient documentation

## 2023-02-15 NOTE — Assessment & Plan Note (Signed)
Suspect this was the issue with his abdominal symptoms. Pain resolved after BM.  No pain now.  BM today.  Discussed as needed MiraLAX use, if he has not had a bowel movement that day.  He can update me as needed.

## 2023-02-15 NOTE — Assessment & Plan Note (Signed)
Reasonable to try home health PT.  He is unable to leave the house on his own due to vision difficulty.  I suspect that he has rotator cuff pathology and PT is a reasonable place to start.  He can update me as needed.

## 2023-02-15 NOTE — Assessment & Plan Note (Signed)
Discussed previous lung abnormality with presumed cancer diagnosis, but also discussed that we do not have a definitive diagnosis without a tissue sample/biopsy.  Discussed that I did expect him to have cancer, but we also discussed that a biopsy may be prohibitively risky.  He would not want to go through a biopsy in the first place and even if he did have a biopsy that showed cancer, he stated that his preference was not to go through treatment.  He has thought about his options and he can clearly express his preferences.  I support his decision making process and his decision.  He is not short of breath.  I think observation at this point is the most reasonable plan and he agrees.  He can update me if he has any changing symptoms.  We talked about taking an adequate protein servings to try to maintain his muscle strain/mass.

## 2023-02-16 DIAGNOSIS — H43813 Vitreous degeneration, bilateral: Secondary | ICD-10-CM | POA: Diagnosis not present

## 2023-02-16 DIAGNOSIS — H35423 Microcystoid degeneration of retina, bilateral: Secondary | ICD-10-CM | POA: Diagnosis not present

## 2023-02-16 DIAGNOSIS — H35033 Hypertensive retinopathy, bilateral: Secondary | ICD-10-CM | POA: Diagnosis not present

## 2023-02-16 DIAGNOSIS — H353231 Exudative age-related macular degeneration, bilateral, with active choroidal neovascularization: Secondary | ICD-10-CM | POA: Diagnosis not present

## 2023-02-17 DIAGNOSIS — Z7902 Long term (current) use of antithrombotics/antiplatelets: Secondary | ICD-10-CM | POA: Diagnosis not present

## 2023-02-17 DIAGNOSIS — H9193 Unspecified hearing loss, bilateral: Secondary | ICD-10-CM | POA: Diagnosis not present

## 2023-02-17 DIAGNOSIS — M19011 Primary osteoarthritis, right shoulder: Secondary | ICD-10-CM | POA: Diagnosis not present

## 2023-02-17 DIAGNOSIS — Z452 Encounter for adjustment and management of vascular access device: Secondary | ICD-10-CM | POA: Diagnosis not present

## 2023-02-17 DIAGNOSIS — I2510 Atherosclerotic heart disease of native coronary artery without angina pectoris: Secondary | ICD-10-CM | POA: Diagnosis not present

## 2023-02-17 DIAGNOSIS — I10 Essential (primary) hypertension: Secondary | ICD-10-CM | POA: Diagnosis not present

## 2023-02-17 DIAGNOSIS — Z8673 Personal history of transient ischemic attack (TIA), and cerebral infarction without residual deficits: Secondary | ICD-10-CM | POA: Diagnosis not present

## 2023-02-17 DIAGNOSIS — K5900 Constipation, unspecified: Secondary | ICD-10-CM | POA: Diagnosis not present

## 2023-02-17 DIAGNOSIS — H3530 Unspecified macular degeneration: Secondary | ICD-10-CM | POA: Diagnosis not present

## 2023-02-17 DIAGNOSIS — K219 Gastro-esophageal reflux disease without esophagitis: Secondary | ICD-10-CM | POA: Diagnosis not present

## 2023-02-17 DIAGNOSIS — Z9089 Acquired absence of other organs: Secondary | ICD-10-CM | POA: Diagnosis not present

## 2023-02-17 DIAGNOSIS — J449 Chronic obstructive pulmonary disease, unspecified: Secondary | ICD-10-CM | POA: Diagnosis not present

## 2023-02-17 DIAGNOSIS — I700 Atherosclerosis of aorta: Secondary | ICD-10-CM | POA: Diagnosis not present

## 2023-02-17 DIAGNOSIS — N400 Enlarged prostate without lower urinary tract symptoms: Secondary | ICD-10-CM | POA: Diagnosis not present

## 2023-02-17 DIAGNOSIS — K2270 Barrett's esophagus without dysplasia: Secondary | ICD-10-CM | POA: Diagnosis not present

## 2023-02-17 DIAGNOSIS — E785 Hyperlipidemia, unspecified: Secondary | ICD-10-CM | POA: Diagnosis not present

## 2023-02-22 ENCOUNTER — Other Ambulatory Visit: Payer: Self-pay | Admitting: Family Medicine

## 2023-02-22 NOTE — Telephone Encounter (Signed)
Last filled 09-09-22 #90 Last OV 02-14-23 No Future OV [facility]

## 2023-02-27 ENCOUNTER — Telehealth: Payer: Self-pay

## 2023-02-27 NOTE — Telephone Encounter (Signed)
I am out of clinic, will address when possible.  Thanks.

## 2023-02-27 NOTE — Telephone Encounter (Signed)
Paperwork: HH Cert    Paperwork received by [employer] requesting form]: [facility] via fax    Individual made aware of 3-5 business day turn around (Y/N): N/A    Office form(s) completed and placed with paperwork (Y/N):   Form location:  From and Charge sheet placed in provider box for review.

## 2023-02-28 DIAGNOSIS — H3530 Unspecified macular degeneration: Secondary | ICD-10-CM | POA: Diagnosis not present

## 2023-02-28 DIAGNOSIS — Z452 Encounter for adjustment and management of vascular access device: Secondary | ICD-10-CM

## 2023-02-28 DIAGNOSIS — N400 Enlarged prostate without lower urinary tract symptoms: Secondary | ICD-10-CM | POA: Diagnosis not present

## 2023-02-28 DIAGNOSIS — H9193 Unspecified hearing loss, bilateral: Secondary | ICD-10-CM | POA: Diagnosis not present

## 2023-02-28 DIAGNOSIS — K5900 Constipation, unspecified: Secondary | ICD-10-CM | POA: Diagnosis not present

## 2023-02-28 DIAGNOSIS — J449 Chronic obstructive pulmonary disease, unspecified: Secondary | ICD-10-CM | POA: Diagnosis not present

## 2023-02-28 DIAGNOSIS — I2510 Atherosclerotic heart disease of native coronary artery without angina pectoris: Secondary | ICD-10-CM | POA: Diagnosis not present

## 2023-02-28 DIAGNOSIS — K219 Gastro-esophageal reflux disease without esophagitis: Secondary | ICD-10-CM | POA: Diagnosis not present

## 2023-02-28 DIAGNOSIS — M19011 Primary osteoarthritis, right shoulder: Secondary | ICD-10-CM | POA: Diagnosis not present

## 2023-02-28 DIAGNOSIS — I700 Atherosclerosis of aorta: Secondary | ICD-10-CM | POA: Diagnosis not present

## 2023-02-28 DIAGNOSIS — E785 Hyperlipidemia, unspecified: Secondary | ICD-10-CM | POA: Diagnosis not present

## 2023-02-28 DIAGNOSIS — Z8673 Personal history of transient ischemic attack (TIA), and cerebral infarction without residual deficits: Secondary | ICD-10-CM

## 2023-02-28 DIAGNOSIS — I10 Essential (primary) hypertension: Secondary | ICD-10-CM | POA: Diagnosis not present

## 2023-02-28 DIAGNOSIS — Z9089 Acquired absence of other organs: Secondary | ICD-10-CM

## 2023-02-28 DIAGNOSIS — Z7902 Long term (current) use of antithrombotics/antiplatelets: Secondary | ICD-10-CM

## 2023-02-28 DIAGNOSIS — K2270 Barrett's esophagus without dysplasia: Secondary | ICD-10-CM | POA: Diagnosis not present

## 2023-03-03 ENCOUNTER — Other Ambulatory Visit: Payer: Self-pay | Admitting: Family Medicine

## 2023-03-03 DIAGNOSIS — G2581 Restless legs syndrome: Secondary | ICD-10-CM

## 2023-03-07 DIAGNOSIS — Z461 Encounter for fitting and adjustment of hearing aid: Secondary | ICD-10-CM | POA: Diagnosis not present

## 2023-03-24 ENCOUNTER — Telehealth: Payer: Self-pay

## 2023-03-24 NOTE — Telephone Encounter (Signed)
Attempted to call both numbers in patients chart. Unable to reach patient and patient wife. Unable to leave voicemail on either number.

## 2023-03-24 NOTE — Telephone Encounter (Signed)
Noted. Please try to call again for update and to give Dr Lianne Bushy recommendations.

## 2023-03-24 NOTE — Telephone Encounter (Signed)
Noted. Agreed.  If he has recurrent symptoms, he can skip a dose of doxazosin if needed.  Thanks.

## 2023-03-24 NOTE — Telephone Encounter (Signed)
Gearldine Bienenstock RN with access nurse called and Princess PT with [facility] HH and [facility] nurse is also there with pt; This morning pt BP 130/60 then pt standing BP 90/58 and then pt sitting again 120/66. Pt has head fullness this morning and lightheaded when first stands. Access nurse disposition was call 911 and go to ED. Pt was also on call and said he feels better and pt said he knows when he first stands up to stand a minute before walking to prevent lightheadedness and pt did not do that this morning. Pt said no CP,SOB, or H/A and no N&V. [name] Kitchenpt does not have extremity weakness. Pt said he feels better now and does not want to go to ED but if pt condition changes or worsens this weekend he will go to ED for eval. Pt already has appt scheduled to see Dr Para March on 03/27/23 at 12 noon. Pt will drink water and stay hydrated. Pt will get up from sitting or laying position slowly also so as not to fall. Princess PT and [employer] both agree pt is stable now and does not appear in any distress at this time. UC & ED precautions given and pt voiced understanding and agreed to go if needed. Sending note to Dr Para March who is out of office and Dr Reece Agar who is in office and will teams [facility] CMA.

## 2023-03-24 NOTE — Telephone Encounter (Signed)
Spoke with pt's wife, Kara Mead (on dpr) relaying Dr. Lianne Bushy message. She verbalizes understanding and will inform pt.

## 2023-03-27 ENCOUNTER — Ambulatory Visit (INDEPENDENT_AMBULATORY_CARE_PROVIDER_SITE_OTHER): Payer: PPO | Admitting: Family Medicine

## 2023-03-27 ENCOUNTER — Encounter: Payer: Self-pay | Admitting: Family Medicine

## 2023-03-27 VITALS — BP 122/60 | HR 88 | Temp 97.6°F | Ht 66.0 in | Wt 146.0 lb

## 2023-03-27 DIAGNOSIS — I10 Essential (primary) hypertension: Secondary | ICD-10-CM | POA: Diagnosis not present

## 2023-03-27 LAB — CBC WITH DIFFERENTIAL/PLATELET
Basophils Absolute: 0.1 10*3/uL (ref 0.0–0.1)
Basophils Relative: 0.9 % (ref 0.0–3.0)
Eosinophils Absolute: 0.3 10*3/uL (ref 0.0–0.7)
Eosinophils Relative: 3.4 % (ref 0.0–5.0)
HCT: 42 % (ref 39.0–52.0)
Hemoglobin: 13.5 g/dL (ref 13.0–17.0)
Lymphocytes Relative: 17.8 % (ref 12.0–46.0)
Lymphs Abs: 1.4 10*3/uL (ref 0.7–4.0)
MCHC: 32.1 g/dL (ref 30.0–36.0)
MCV: 88.8 fl (ref 78.0–100.0)
Monocytes Absolute: 0.7 10*3/uL (ref 0.1–1.0)
Monocytes Relative: 8.9 % (ref 3.0–12.0)
Neutro Abs: 5.5 10*3/uL (ref 1.4–7.7)
Neutrophils Relative %: 69 % (ref 43.0–77.0)
Platelets: 319 10*3/uL (ref 150.0–400.0)
RBC: 4.73 Mil/uL (ref 4.22–5.81)
RDW: 13.8 % (ref 11.5–15.5)
WBC: 8 10*3/uL (ref 4.0–10.5)

## 2023-03-27 LAB — COMPREHENSIVE METABOLIC PANEL
ALT: 12 U/L (ref 0–53)
AST: 16 U/L (ref 0–37)
Albumin: 3.6 g/dL (ref 3.5–5.2)
Alkaline Phosphatase: 76 U/L (ref 39–117)
BUN: 20 mg/dL (ref 6–23)
CO2: 25 mEq/L (ref 19–32)
Calcium: 9.2 mg/dL (ref 8.4–10.5)
Chloride: 107 mEq/L (ref 96–112)
Creatinine, Ser: 1.07 mg/dL (ref 0.40–1.50)
GFR: 59.02 mL/min — ABNORMAL LOW (ref >60.00)
Glucose, Bld: 106 mg/dL — ABNORMAL HIGH (ref 70–99)
Potassium: 3.9 mEq/L (ref 3.5–5.1)
Sodium: 139 mEq/L (ref 135–145)
Total Bilirubin: 0.3 mg/dL (ref 0.2–1.2)
Total Protein: 6.8 g/dL (ref 6.0–8.3)

## 2023-03-27 NOTE — Patient Instructions (Addendum)
Drink enough water to keep your urine clear, stand slowly, and if needed skip a dose of doxazosin.    Go to the lab on the way out.   If you have mychart we'll likely use that to update you.    Take care.  Glad to see you.

## 2023-03-27 NOTE — Progress Notes (Unsigned)
D/w pt about fatigue.  Prev abnormal imaging noted.  He had variable BP, lower when standing.  Taking flomax, doxazosin and diltiazem.  Cautions on standing quickly, d/w pt.  Dec in appetite noted.  Can still take a deep breath.  No hemoptysis.    He is still working in the garden some, tomatoes and cucumbers.    Meds, vitals, and allergies reviewed.   ROS: Per HPI unless specifically indicated in ROS section   Ctab Rrr No BLE edema.

## 2023-03-29 NOTE — Assessment & Plan Note (Signed)
Lower BP upon standing, with some fatigue.  See notes on labs. Advised to drink enough water to keep his urine clear, stand slowly, and if needed skip a dose of doxazosin.    This may or may not be related to his abnormal chest imaging but the management would still be the same.  He does not want to go through a biopsy or aggressive treatment and I think pursuing aggressive treatment would likely be counterproductive, worsen his functional status, and potentially shorten his life span.  Discussed and he agrees.

## 2023-04-04 ENCOUNTER — Other Ambulatory Visit: Payer: Self-pay | Admitting: Interventional Cardiology

## 2023-04-06 ENCOUNTER — Emergency Department (HOSPITAL_COMMUNITY)
Admission: EM | Admit: 2023-04-06 | Discharge: 2023-04-06 | Disposition: A | Discharge status: Home / Self Care | Payer: PPO | Attending: Emergency Medicine | Admitting: Emergency Medicine

## 2023-04-06 ENCOUNTER — Emergency Department (HOSPITAL_COMMUNITY): Payer: PPO

## 2023-04-06 ENCOUNTER — Other Ambulatory Visit: Payer: Self-pay

## 2023-04-06 ENCOUNTER — Telehealth: Payer: Self-pay | Admitting: Family Medicine

## 2023-04-06 ENCOUNTER — Encounter (HOSPITAL_COMMUNITY): Payer: Self-pay

## 2023-04-06 DIAGNOSIS — R918 Other nonspecific abnormal finding of lung field: Secondary | ICD-10-CM | POA: Diagnosis not present

## 2023-04-06 DIAGNOSIS — I10 Essential (primary) hypertension: Secondary | ICD-10-CM | POA: Diagnosis not present

## 2023-04-06 DIAGNOSIS — R5383 Other fatigue: Secondary | ICD-10-CM | POA: Diagnosis not present

## 2023-04-06 DIAGNOSIS — R0602 Shortness of breath: Secondary | ICD-10-CM | POA: Insufficient documentation

## 2023-04-06 DIAGNOSIS — I499 Cardiac arrhythmia, unspecified: Secondary | ICD-10-CM | POA: Diagnosis not present

## 2023-04-06 DIAGNOSIS — Z79899 Other long term (current) drug therapy: Secondary | ICD-10-CM | POA: Insufficient documentation

## 2023-04-06 DIAGNOSIS — Z7902 Long term (current) use of antithrombotics/antiplatelets: Secondary | ICD-10-CM | POA: Insufficient documentation

## 2023-04-06 DIAGNOSIS — R531 Weakness: Secondary | ICD-10-CM | POA: Insufficient documentation

## 2023-04-06 DIAGNOSIS — I6782 Cerebral ischemia: Secondary | ICD-10-CM | POA: Diagnosis not present

## 2023-04-06 DIAGNOSIS — R0902 Hypoxemia: Secondary | ICD-10-CM | POA: Diagnosis not present

## 2023-04-06 LAB — COMPREHENSIVE METABOLIC PANEL
ALT: 15 U/L (ref 0–44)
AST: 24 U/L (ref 15–41)
Albumin: 3 g/dL — ABNORMAL LOW (ref 3.5–5.0)
Alkaline Phosphatase: 69 U/L (ref 38–126)
Anion gap: 13 (ref 5–15)
BUN: 10 mg/dL (ref 8–23)
CO2: 23 mmol/L (ref 22–32)
Calcium: 8.8 mg/dL — ABNORMAL LOW (ref 8.9–10.3)
Chloride: 101 mmol/L (ref 98–111)
Creatinine, Ser: 0.98 mg/dL (ref 0.61–1.24)
GFR, Estimated: >60 mL/min (ref >60)
Glucose, Bld: 91 mg/dL (ref 70–99)
Potassium: 3.8 mmol/L (ref 3.5–5.1)
Sodium: 137 mmol/L (ref 135–145)
Total Bilirubin: 0.4 mg/dL (ref 0.3–1.2)
Total Protein: 6.4 g/dL — ABNORMAL LOW (ref 6.5–8.1)

## 2023-04-06 LAB — TROPONIN I (HIGH SENSITIVITY)
Troponin I (High Sensitivity): 11 ng/L (ref <18)
Troponin I (High Sensitivity): 14 ng/L (ref <18)

## 2023-04-06 LAB — CBC WITH DIFFERENTIAL/PLATELET
Abs Immature Granulocytes: 0.03 10*3/uL (ref 0.00–0.07)
Basophils Absolute: 0 10*3/uL (ref 0.0–0.1)
Basophils Relative: 0 %
Eosinophils Absolute: 0.1 10*3/uL (ref 0.0–0.5)
Eosinophils Relative: 1 %
HCT: 43.1 % (ref 39.0–52.0)
Hemoglobin: 14.2 g/dL (ref 13.0–17.0)
Immature Granulocytes: 0 %
Lymphocytes Relative: 12 %
Lymphs Abs: 0.9 10*3/uL (ref 0.7–4.0)
MCH: 29 pg (ref 26.0–34.0)
MCHC: 32.9 g/dL (ref 30.0–36.0)
MCV: 88.1 fL (ref 80.0–100.0)
Monocytes Absolute: 0.5 10*3/uL (ref 0.1–1.0)
Monocytes Relative: 8 %
Neutro Abs: 5.5 10*3/uL (ref 1.7–7.7)
Neutrophils Relative %: 79 %
Platelets: 298 10*3/uL (ref 150–400)
RBC: 4.89 MIL/uL (ref 4.22–5.81)
RDW: 13.1 % (ref 11.5–15.5)
WBC: 7.1 10*3/uL (ref 4.0–10.5)
nRBC: 0 % (ref 0.0–0.2)

## 2023-04-06 LAB — URINALYSIS, ROUTINE W REFLEX MICROSCOPIC
Bilirubin Urine: NEGATIVE
Glucose, UA: NEGATIVE mg/dL
Hgb urine dipstick: NEGATIVE
Ketones, ur: 5 mg/dL — AB
Leukocytes,Ua: NEGATIVE
Nitrite: NEGATIVE
Protein, ur: NEGATIVE mg/dL
Specific Gravity, Urine: 1.009 (ref 1.005–1.030)
pH: 6 (ref 5.0–8.0)

## 2023-04-06 LAB — BRAIN NATRIURETIC PEPTIDE: B Natriuretic Peptide: 100.2 pg/mL — ABNORMAL HIGH (ref 0.0–100.0)

## 2023-04-06 MED ORDER — SODIUM CHLORIDE 0.9 % IV BOLUS
1000.0000 mL | Freq: Once | INTRAVENOUS | Status: AC
  Administered 2023-04-06: 1000 mL via INTRAVENOUS

## 2023-04-06 MED ORDER — ACETAMINOPHEN 325 MG PO TABS
650.0000 mg | ORAL_TABLET | Freq: Once | ORAL | Status: AC
  Administered 2023-04-06: 650 mg via ORAL
  Filled 2023-04-06: qty 2

## 2023-04-06 NOTE — ED Triage Notes (Signed)
Per EMS and pt report, pt has been feeling for the past couple of days. Pt's wife states the weakness has been going on for about two weeks and the increased hard breathing has been going on for a week. Pt and pt's wife state it is hard for the pt to walk because he is feeling so tired and weak.

## 2023-04-06 NOTE — ED Provider Notes (Signed)
Signout from Dr. Theresia Lo.  89 year old male with recent diagnosis of lung mass here with general weakness.  So far his workup has been fairly unremarkable.  He is pending results of his head CT and delta troponin.  He is hoping to be able to be discharged if no significant findings are identified. Physical Exam  BP (!) 163/79   Pulse 73   Temp 98 F (36.7 C) (Oral)   Resp 20   Ht 5\' 6"  (1.676 m)   Wt 66.2 kg   SpO2 97%   BMI 23.57 kg/m   Physical Exam  Procedures  Procedures  ED Course / MDM   Clinical Course as of 04/06/23 1719  Thu Apr 06, 2023  1530 XR showed mild increase of size of his lung mass.  I confirmed with the patient that he did not want any further treatment or workup for this.  He states that he does have PT and home health coming once a week at home and would prefer to work with him at home rather than go to a SNF. Patient signed out to Dr. Charm Barges pending CT read and repeat trop with plan for likely discharge home. [VK]    Clinical Course User Index [VK] Rexford Maus, DO   Medical Decision Making Amount and/or Complexity of Data Reviewed Labs: ordered. Radiology: ordered.  Risk OTC drugs.   Patient CT head and delta troponin were unremarkable.  I reviewed workup with patient's wife and family member along with patient.  She said he wants to go home and does not feel he needs to stay in the hospital.  They will follow-up with PCP.  Return instructions discussed       Terrilee Files, MD 04/07/23 1246

## 2023-04-06 NOTE — ED Notes (Signed)
Called lab. They did not have 2nd trop. Redrawn and resent down.

## 2023-04-06 NOTE — Discharge Instructions (Signed)
You were seen in the emergency department for general weakness.  You had blood work urinalysis CAT scan of your head and chest x-ray that did not show an obvious explanation for your symptoms.  Please continue your current medications and follow-up with your primary care doctor.  Drink plenty of fluids.  Return to the emergency department if any worsening or concerning symptoms.

## 2023-04-06 NOTE — Telephone Encounter (Signed)
Patient is currently at ED

## 2023-04-06 NOTE — ED Notes (Signed)
EMS put the pt on 2L [place], but on RA here in ED the pt is 97%. Ambulated to the stretcher.

## 2023-04-06 NOTE — ED Provider Notes (Signed)
[place] EMERGENCY DEPARTMENT AT [facility] Provider Note   CSN: [id number] Arrival date & time: 04/06/23  1018     History  Chief Complaint  Patient presents with   Weakness    EDWORD PRIDGEON is a 89 y.o. male.  Patient is a 89 year old male with a past medical history of hypertension and recently diagnosed lung mass not pursuing additional workup presenting to the emergency department with weakness.  Patient reports over the last few days he has had increasing weakness and fatigue and has not been getting out of bed.  He states he has had some mild shortness of breath.  He states he is had a decreased appetite but denies any nausea or vomiting.  Reports that he has had some mild intermittent headaches.  He denies any numbness or focal weakness.  He denies any chest or abdominal pain.  He denies any fevers.  The history is provided by the patient and the spouse.  Weakness      Home Medications Prior to Admission medications   Medication Sig Start Date End Date Taking? Authorizing Provider  Alirocumab (PRALUENT) 75 MG/ML SOAJ INJECT 75 MG SUBCUTANEOUSLY EVERY 14 DAYS 04/04/23   Corky Crafts, MD  acetaminophen (TYLENOL) 500 MG tablet Take 1,000 mg by mouth every 6 (six) hours as needed for headache (pain).    [provider]  AFLIBERCEPT IO Inject into the eye See admin instructions. Right eye injection every 4 weeks    [provider]  Bevacizumab (AVASTIN IV) Place into the left eye See admin instructions. Left eye injection every 8 weeks    [provider]  cetirizine (ZYRTEC) 10 MG tablet Take 10 mg by mouth daily as needed for allergies.    [provider]  clopidogrel (PLAVIX) 75 MG tablet TAKE 1 TABLET BY MOUTH EVERY DAY 10/10/22   Joaquim Nam, MD  diltiazem (CARDIZEM CD) 180 MG 24 hr capsule TAKE 1 CAPSULE BY MOUTH EVERY DAY 11/07/22   Joaquim Nam, MD  doxazosin (CARDURA) 1 MG tablet Take 1 tablet (1 mg total) by  mouth 2 (two) times daily. 02/14/23   Joaquim Nam, MD  famotidine (PEPCID) 20 MG tablet TAKE 1 TABLET (20 MG TOTAL) BY MOUTH 2 (TWO) TIMES DAILY AS NEEDED FOR HEARTBURN OR INDIGESTION. 11/28/22   Joaquim Nam, MD  ferrous sulfate 325 (65 FE) MG tablet Take 1 tablet (325 mg total) by mouth daily with breakfast. 04/05/17   Joaquim Nam, MD  fluticasone [facility]) 50 MCG/ACT nasal spray Place 2 sprays into both nostrils daily as needed for allergies. 08/21/15   Joaquim Nam, MD  loperamide (IMODIUM) 2 MG capsule Take 1 capsule (2 mg total) by mouth as needed for diarrhea or loose stools. 09/22/20   Rolly Salter, MD  Multiple Vitamins-Minerals (OCUVITE ADULT 50+ PO) Take 1 capsule by mouth daily with lunch.    [provider]  nitroGLYCERIN (NITROSTAT) 0.4 MG SL tablet Place 1 tablet (0.4 mg total) under the tongue every 5 (five) minutes as needed for chest pain (max 3 doses in 15 minutes). 03/26/18   Karie Schwalbe, MD  pantoprazole (PROTONIX) 40 MG tablet TAKE 1 TABLET BY MOUTH EVERY DAY IN THE MORNING 07/11/22   Joaquim Nam, MD  Polyethyl Glycol-Propyl Glycol (SYSTANE) 0.4-0.3 % SOLN Place 1 drop into both eyes 3 (three) times daily as needed (dry eyes).    [provider]  polyethylene glycol powder (GLYCOLAX/MIRALAX) 17  GM/SCOOP powder Take 17 g by mouth daily as needed. 02/14/23   Joaquim Nam, MD  rOPINIRole (REQUIP) 0.5 MG tablet TAKE 1 TABLET BY MOUTH EVERYDAY AT BEDTIME 03/03/23   Joaquim Nam, MD  tamsulosin (FLOMAX) 0.4 MG CAPS capsule TAKE 1 CAPSULE BY MOUTH EVERY DAY 02/19/19   Joaquim Nam, MD  traMADol (ULTRAM) 50 MG tablet TAKE 1 TABLET (50 MG TOTAL) BY MOUTH EVERY 8 (EIGHT) HOURS AS NEEDED. FOR PAIN 02/22/23   Joaquim Nam, MD  vitamin B-12 (CYANOCOBALAMIN) 1000 MCG tablet Take 2 tablets (2,000 mcg total) by mouth daily. 01/28/16   Joaquim Nam, MD      Allergies    Angiotensin receptor blockers, Atorvastatin, Codeine, Lisinopril,  Statins, and Gabapentin    Review of Systems   Review of Systems  Neurological:  Positive for weakness.    Physical Exam Updated Vital Signs BP (!) 163/79   Pulse 73   Temp 98 F (36.7 C) (Oral)   Resp 20   Ht 5\' 6"  (1.676 m)   Wt 66.2 kg   SpO2 97%   BMI 23.57 kg/m  Physical Exam Vitals and nursing note reviewed.  Constitutional:      General: He is not in acute distress.    Appearance: Normal appearance.  HENT:     Head: Normocephalic and atraumatic.     Nose: Nose normal.     Mouth/Throat:     Mouth: Mucous membranes are dry.     Pharynx: Oropharynx is clear.  Eyes:     Extraocular Movements: Extraocular movements intact.     Conjunctiva/sclera: Conjunctivae normal.     Comments: Surgical left pupil  Cardiovascular:     Rate and Rhythm: Normal rate and regular rhythm.     Heart sounds: Normal heart sounds.  Pulmonary:     Effort: Pulmonary effort is normal.     Breath sounds: Normal breath sounds.  Abdominal:     General: Abdomen is flat.     Palpations: Abdomen is soft.     Tenderness: There is no abdominal tenderness.  Musculoskeletal:        General: Normal range of motion.     Cervical back: Normal range of motion.  Skin:    General: Skin is warm and dry.  Neurological:     General: No focal deficit present.     Mental Status: He is alert and oriented to person, place, and time.     Sensory: No sensory deficit.     Motor: No weakness.  Psychiatric:        Mood and Affect: Mood normal.        Behavior: Behavior normal.     ED Results / Procedures / Treatments   Labs (all labs ordered are listed, but only abnormal results are displayed) Labs Reviewed  COMPREHENSIVE METABOLIC PANEL - Abnormal; Notable for the following components:      Result Value   Calcium 8.8 (*)    Total Protein 6.4 (*)    Albumin 3.0 (*)    All other components within normal limits  URINALYSIS, ROUTINE W REFLEX MICROSCOPIC - Abnormal; Notable for the following components:    Ketones, ur 5 (*)    All other components within normal limits  BRAIN NATRIURETIC PEPTIDE - Abnormal; Notable for the following components:   B Natriuretic Peptide 100.2 (*)    All other components within normal limits  CBC WITH DIFFERENTIAL/PLATELET  TROPONIN I (HIGH SENSITIVITY)  TROPONIN I (HIGH  SENSITIVITY)    EKG EKG Interpretation Date/Time:  Thursday April 06 2023 10:32:56 EDT Ventricular Rate:  71 PR Interval:  180 QRS Duration:  141 QT Interval:  424 QTC Calculation: 461 R Axis:   76  Text Interpretation: Sinus rhythm Right bundle branch block No significant change since last tracing Confirmed by Elayne Snare ([id number]) on 04/06/2023 10:56:02 AM  Radiology CT Head Wo Contrast  Result Date: 04/06/2023 CLINICAL DATA:  Weakness EXAM: CT HEAD WITHOUT CONTRAST TECHNIQUE: Contiguous axial images were obtained from the base of the skull through the vertex without intravenous contrast. RADIATION DOSE REDUCTION: This exam was performed according to the departmental dose-optimization program which includes automated exposure control, adjustment of the mA and/or kV according to patient size and/or use of iterative reconstruction technique. COMPARISON:  CT Head 10/28/22 FINDINGS: Brain: Negative for an acute infarct. No hemorrhage. No hydrocephalus. No extra-axial fluid collection. Sequela of moderate to severe chronic microvascular ischemic change. No mass effect or mass lesion. Chronic right thalamic infarct. Vascular: No hyperdense vessel or unexpected calcification. Skull: Normal. Negative for fracture or focal lesion. Sinuses/Orbits: Redemonstrated large left-sided mastoid and middle ear effusion. Mucosal thickening right maxillary sinus. Bilateral lens replacement. Orbits are otherwise unremarkable. Other: None. IMPRESSION: 1. No acute intracranial process. 2. Redemonstrated large left-sided mastoid and middle ear effusion. Electronically Signed   By: Lorenza Cambridge M.D.   On: 04/06/2023  15:30   DG Chest Port 1 View  Result Date: 04/06/2023 CLINICAL DATA:  Shortness of breath and weakness. Known large lung mass. EXAM: PORTABLE CHEST 1 VIEW COMPARISON:  X-ray 10/28/2022.  CT 11/30/2022. FINDINGS: Left upper lobe lung mass near the hilum is further increased in size with fullness in the left hilum and AP window. Please correlate for any known history of malignancy. Additional contrast chest CT as clinically appropriate to further delineate these changes. No pneumothorax or effusion. Stable cardiopericardial silhouette. Loop recorder. Overlapping cardiac leads. IMPRESSION: Enlarging left upper lung mass involving the hilum and fullness along the left side of the mediastinum. Please correlate with prior workup. Additional evaluation as clinically appropriate to assess for changes. Electronically Signed   By: Karen Kays M.D.   On: 04/06/2023 13:00    Procedures Procedures    Medications Ordered in ED Medications  sodium chloride 0.9 % bolus 1,000 mL (1,000 mLs Intravenous New Bag/Given 04/06/23 1255)  acetaminophen (TYLENOL) tablet 650 mg (650 mg Oral Given 04/06/23 1259)    ED Course/ Medical Decision Making/ A&P Clinical Course as of 04/06/23 1552  Thu Apr 06, 2023  1530 XR showed mild increase of size of his lung mass.  I confirmed with the patient that he did not want any further treatment or workup for this.  He states that he does have PT and home health coming once a week at home and would prefer to work with him at home rather than go to a SNF. Patient signed out to Dr. Charm Barges pending CT read and repeat trop with plan for likely discharge home. [VK]    Clinical Course User Index [VK] Rexford Maus, DO                                 Medical Decision Making This patient presents to the ED with chief complaint(s) of weakness with pertinent past medical history of HTN, lung mass which further complicates the presenting complaint. The complaint involves an extensive  differential diagnosis and  also carries with it a high risk of complications and morbidity.    The differential diagnosis includes hydration, electrolyte abnormality, anemia, arrhythmia, no focal deficits making a CVA less likely, infection  Additional history obtained: Additional history obtained from family Records reviewed Primary Care Documents  ED Course and Reassessment: Patient's arrival he is hemodynamically stable in no acute distress without focal neurologic deficits.  The patient will have EKG, labs including urine, chest x-ray and head CT to evaluate for cause of his symptoms.  He will be given IV fluids and will be closely reassessed.  Independent labs interpretation:  The following labs were independently interpreted: Within normal range  Independent visualization of imaging: - I independently visualized the following imaging with scope of interpretation limited to determining acute life threatening conditions related to emergency care: Chest X-ray, which revealed no acute disease      Amount and/or Complexity of Data Reviewed Labs: ordered. Radiology: ordered.  Risk OTC drugs.          Final Clinical Impression(s) / ED Diagnoses Final diagnoses:  Generalized weakness  Lung mass    Rx / DC Orders ED Discharge Orders     None         Rexford Maus, DO 04/06/23 1552

## 2023-04-06 NOTE — Telephone Encounter (Signed)
Agree with ER eval.  Will await ER report.  Thanks.

## 2023-04-06 NOTE — Telephone Encounter (Signed)
Wife instructed to call 911 per Access nurse.  EMS to decide what ED to take pt to.

## 2023-04-06 NOTE — Telephone Encounter (Signed)
FYI: This call has been transferred to Access Nurse. Once the result note has been entered staff can address the message at that time.  Patient called in with the following symptoms:  Red Word: weak Patient wife called in and stated that he was feeling weak and could barely walk   Please advise at [facility] [phone]  Message is routed to Provider Pool and [facility] Triage

## 2023-04-06 NOTE — ED Notes (Signed)
Pt noted to have a headache.  Doctor advised over secure chat.

## 2023-04-07 ENCOUNTER — Other Ambulatory Visit: Payer: Self-pay | Admitting: Family Medicine

## 2023-04-11 ENCOUNTER — Encounter: Payer: Self-pay | Admitting: Family Medicine

## 2023-04-11 ENCOUNTER — Ambulatory Visit: Payer: PPO | Admitting: Family Medicine

## 2023-04-11 VITALS — BP 108/60 | HR 88 | Temp 98.0°F | Ht 66.0 in | Wt 151.0 lb

## 2023-04-11 DIAGNOSIS — I639 Cerebral infarction, unspecified: Secondary | ICD-10-CM

## 2023-04-11 DIAGNOSIS — R351 Nocturia: Secondary | ICD-10-CM

## 2023-04-11 DIAGNOSIS — R9389 Abnormal findings on diagnostic imaging of other specified body structures: Secondary | ICD-10-CM | POA: Diagnosis not present

## 2023-04-11 MED ORDER — TAMSULOSIN HCL 0.4 MG PO CAPS
0.8000 mg | ORAL_CAPSULE | Freq: Every day | ORAL | Status: DC
Start: 2023-04-11 — End: 2023-05-08

## 2023-04-11 NOTE — Patient Instructions (Signed)
Increase flomax to 2 tabs a day.  See if that helps with urination.  If your are lightheaded then let me know since we may need to adjust the diltiazem dose.  Take care.  Glad to see you.

## 2023-04-11 NOTE — Progress Notes (Unsigned)
Increased weight recently, but near baseline.   Increased nocturia.  Disrupting sleep.  No burning with urination.  Same level of fluid intake.  Feels like incomplete voiding.  Taking doxazosin 1mg  BID.  Taking flomax 1 tab a day.  BP controlled.   No BLE edema.  Not SOB at OV but some episodes with inc WOB occ noted   Meds, vitals, and allergies reviewed.   ROS: Per HPI unless specifically indicated in ROS section

## 2023-04-12 ENCOUNTER — Telehealth: Payer: Self-pay

## 2023-04-12 DIAGNOSIS — R351 Nocturia: Secondary | ICD-10-CM | POA: Insufficient documentation

## 2023-04-12 NOTE — Assessment & Plan Note (Signed)
Likely from prostate enlargement/bladder outlet obstruction.  Discussed options. Increase flomax to 2 tabs a day.  He can see if that helps with urination.  If lightheaded then let me know since we may need to adjust his diltiazem dose.

## 2023-04-12 NOTE — Assessment & Plan Note (Signed)
Previous imaging regarding lung abnormality discussed with patient.  No hemoptysis.  He does not have formal diagnosis since he has not had a biopsy but we discussed the suspected likely cancer.  We both agreed that it would be counterproductive to go through a biopsy and systemic treatment in his situation.

## 2023-04-12 NOTE — Telephone Encounter (Signed)
Transition Care Management Unsuccessful Follow-up Telephone Call  Date of discharge and from where:  [facility] 8/15  Attempts:  1st Attempt  Reason for unsuccessful TCM follow-up call:  No answer/busy   Lenard Forth [place]  [facility], [facility] Guide, Phone: [phone] Website: [email or web address]

## 2023-04-13 ENCOUNTER — Telehealth: Payer: Self-pay

## 2023-04-13 NOTE — Telephone Encounter (Signed)
Transition Care Management Unsuccessful Follow-up Telephone Call  Date of discharge and from where:  [facility] 8/[facility]  Attempts:  2nd Attempt  Reason for unsuccessful TCM follow-up call:  No answer/busy   Lenard Forth   [facility], [facility] Guide, Phone: [phone] Website: [email or web address]

## 2023-04-27 DIAGNOSIS — H35423 Microcystoid degeneration of retina, bilateral: Secondary | ICD-10-CM | POA: Diagnosis not present

## 2023-04-27 DIAGNOSIS — H353231 Exudative age-related macular degeneration, bilateral, with active choroidal neovascularization: Secondary | ICD-10-CM | POA: Diagnosis not present

## 2023-04-27 DIAGNOSIS — H35033 Hypertensive retinopathy, bilateral: Secondary | ICD-10-CM | POA: Diagnosis not present

## 2023-04-27 DIAGNOSIS — H43813 Vitreous degeneration, bilateral: Secondary | ICD-10-CM | POA: Diagnosis not present

## 2023-05-06 ENCOUNTER — Other Ambulatory Visit: Payer: Self-pay | Admitting: Family Medicine

## 2023-05-08 ENCOUNTER — Telehealth: Payer: Self-pay | Admitting: Family Medicine

## 2023-05-08 DIAGNOSIS — I639 Cerebral infarction, unspecified: Secondary | ICD-10-CM

## 2023-05-08 MED ORDER — TAMSULOSIN HCL 0.4 MG PO CAPS
0.8000 mg | ORAL_CAPSULE | Freq: Every day | ORAL | 3 refills | Status: DC
Start: 2023-05-08 — End: 2023-08-03

## 2023-05-08 NOTE — Telephone Encounter (Signed)
I sent the rx for 180 pills with refills so he can keep taking 2 a day.  Update me if lightheaded/as needed.  Thanks.

## 2023-05-08 NOTE — Telephone Encounter (Signed)
Patient wife called in and stated that the tamsulosin (FLOMAX) 0.4 MG CAPS capsule is working fine for [employer]. She stated that she was informed to call and let Dr. Para March know so he can increase the dosage. Thank you!

## 2023-05-23 ENCOUNTER — Telehealth: Payer: Self-pay | Admitting: Family Medicine

## 2023-05-23 NOTE — Telephone Encounter (Signed)
I spoke with pt wife (DPR signed) pts wife notified as instructed and voiced understanding. Pt started about 2 wks ago with lt side to mid CP that is an intermittent sharp pain. Pt last had worse CP on 05/22/23. Abd pain was not as bad and pts wife thinks that was related to gas; pt took ant acid and that seemed to help and pt was able to pass gas.pts wife said pt is in no pain now and has not had a lot of pain today. No SOB, no fever and no N&V. On 05/22/23 before taking meds BP was 162/83 P 80 pulse ox 92% room air. 2 hrs later after taking meds BP 132/78 P 80. Pts wife said that even when pt is hurting badly he does not want to make appt to see anyone., UC & ED precautions given and pts wife voiced understanding. Sending note to Dr Para March.

## 2023-05-23 NOTE — Telephone Encounter (Signed)
Patient is prescribed tramadol for every 8 hours,wife called in today stating that he is in a lot of pain in his chest and abdominal area.She would like to know if he could take the medication closer together than every 8 hours for his pain?

## 2023-05-23 NOTE — Telephone Encounter (Signed)
He can take it q6 hours but more importantly please triage this patient.  Thanks.

## 2023-05-24 NOTE — Telephone Encounter (Signed)
Noted. Thanks.

## 2023-05-31 ENCOUNTER — Ambulatory Visit: Payer: PPO

## 2023-06-02 ENCOUNTER — Telehealth: Payer: Self-pay | Admitting: Family Medicine

## 2023-06-02 NOTE — Telephone Encounter (Signed)
FYI: This call has been transferred to Access Nurse. Once the result note has been entered staff can address the message at that time.  Patient called in with the following symptoms:  Red Word:chest pain Patient wife called in and stated that patient was having chest pain and his oxygen level was lower than normal at 88. She stated that he was also wheezing and may need oxygen.   Please advise at [facility] [phone]  Message is routed to Provider Pool and [facility] Triage

## 2023-06-02 NOTE — Telephone Encounter (Signed)
Noted.  Will see at clinic.  He has documented O2 sat below 90%.  Please see about sending DME order for home O2 for 2 L via [place] to keep sats >90%.  If decompensating in the meantime, then needs eval sooner.  Thanks.

## 2023-06-02 NOTE — Telephone Encounter (Signed)
I spoke with pts wife(DPR signed) pt had episode this morning of sharpe left sided CP with no radiation into neck or arm. Pt does not have oxygen at home; usually oxygen level is 88%; now 92%.  Pt has wheezing and SOB also.[name] Kitchen access nurse advised ED but pt declined. Pts wife wonders if pt needs oxygen in home. Pts wife scheduled appt with Dr Para March on 06/05/23 at 12 noon with UC & ED precautions given and pt wife voiced understanding but she doubted pt would go to [facility] or ED.  Pts care giver is there today with pt wife also. Sending note to Dr Para March and Para March pool.

## 2023-06-05 ENCOUNTER — Ambulatory Visit (INDEPENDENT_AMBULATORY_CARE_PROVIDER_SITE_OTHER): Payer: PPO | Admitting: Family Medicine

## 2023-06-05 ENCOUNTER — Encounter: Payer: Self-pay | Admitting: Family Medicine

## 2023-06-05 VITALS — BP 114/60 | HR 83 | Temp 98.3°F | Ht 66.0 in | Wt 150.4 lb

## 2023-06-05 DIAGNOSIS — R918 Other nonspecific abnormal finding of lung field: Secondary | ICD-10-CM | POA: Diagnosis not present

## 2023-06-05 MED ORDER — ALBUTEROL SULFATE HFA 108 (90 BASE) MCG/ACT IN AERS: 2.0000 | INHALATION_SPRAY | Freq: Four times a day (QID) | RESPIRATORY_TRACT | 2 refills | Status: DC | PRN

## 2023-06-05 MED ORDER — DOXYCYCLINE HYCLATE 100 MG PO TABS: 100.0000 mg | ORAL_TABLET | Freq: Two times a day (BID) | ORAL | 0 refills | Status: DC

## 2023-06-05 MED ORDER — PREDNISONE 20 MG PO TABS: ORAL_TABLET | ORAL | 0 refills | Status: DC

## 2023-06-05 NOTE — Progress Notes (Signed)
"  Some days I have a good day, some days I can't go."  Variable appetite.  More SOB, wheeze.  Less active due to dyspnea.  Has extra help at home.  Episodic lower O2 sats.    Variable pulse ox at home, down into the upper 80s at home on RA.  Chest feels better after coughing up sputum.  91% on 3L at OV.  Recheck pulse ox 96% on 3L.    Dec flow rate to 2L and 92%.    Yellowish sputum.  Weight stable.  Has been taking tramadol TID at baseline recently.    Meds, vitals, and allergies reviewed.   ROS: Per HPI unless specifically indicated in ROS section   Nad Ncat Neck supple, no LA Rrr Wheeze and rhonchi in the LUL.   No BLE edema.

## 2023-06-05 NOTE — Assessment & Plan Note (Signed)
With concern for lung CA, d/w pt.  He doesn't want hospitalization.  "I want to die at home."  D/w pt about hospice and home care.  Referral placed.  Start O2 at 2L.  D/w pt.  Order sent.  Presumed obstructive PNA, d/w pt.  Start prednisone, doxy and albuterol.  35 minutes were devoted to patient care in this encounter (this includes time spent reviewing the patient's file/history, interviewing and examining the patient, counseling/reviewing plan with patient).

## 2023-06-05 NOTE — Patient Instructions (Signed)
Hospice should see you today about home O2.  Start prednisone and doxy.  Use albuterol if needed.  Take care.  Glad to see you.

## 2023-06-21 ENCOUNTER — Ambulatory Visit (INDEPENDENT_AMBULATORY_CARE_PROVIDER_SITE_OTHER): Payer: PPO

## 2023-06-21 VITALS — Ht 66.0 in | Wt 150.0 lb

## 2023-06-21 DIAGNOSIS — Z0000 Encounter for general adult medical examination without abnormal findings: Secondary | ICD-10-CM

## 2023-06-21 NOTE — Patient Instructions (Signed)
[name] , Thank you for taking time to come for your Medicare Wellness Visit. I appreciate your ongoing commitment to your health goals. Please review the following plan we discussed and let me know if I can assist you in the future.   Referrals/Orders/Follow-Ups/Clinician Recommendations: none  This is a list of the screening recommended for you and due dates:  Health Maintenance  Topic Date Due   Zoster (Shingles) Vaccine (1 of 2) Never done   Flu Shot  03/23/2023   COVID-19 Vaccine (5 - 2023-24 season) 04/23/2023   DTaP/Tdap/Td vaccine (3 - Td or Tdap) 12/23/2027   Pneumonia Vaccine  Completed   HPV Vaccine  Aged Out    Advanced directives: (In Chart) A copy of your advanced directives are scanned into your chart should your provider ever need it.  Next Medicare Annual Wellness Visit scheduled for next year: No Wife says in hospice:no need to make appt for next year

## 2023-06-21 NOTE — Progress Notes (Signed)
Subjective:   [name] is a 89 y.o. male who presents for Medicare Annual/Subsequent preventive examination.  Visit Complete: Virtual I connected with  [name]  (wife and caretaker) regarding [name] on 06/21/23 by a audio enabled telemedicine application and verified that I am speaking with the correct person using two identifiers.  Patient Location: Home  Provider Location: Office/Clinic  I discussed the limitations of evaluation and management by telemedicine. The patient expressed understanding and agreed to proceed.  Vital Signs: Because this visit was a virtual/telehealth visit, some criteria may be missing or patient reported. Any vitals not documented were not able to be obtained and vitals that have been documented are patient reported.  Patient Medicare AWV questionnaire was completed by the patient on (not done); I have confirmed that all information answered by patient is correct and no changes since this date.      Objective:    There were no vitals filed for this visit. There is no height or weight on file to calculate BMI.     04/06/2023   10:33 AM 09/16/2022    5:04 PM 01/31/2022    2:25 PM 01/28/2021    2:02 PM 09/21/2020    9:30 PM 09/21/2020    9:22 PM 11/29/2019    3:07 PM  Advanced Directives  Does Patient Have a Medical Advance Directive? No Yes No Yes Yes Yes Yes  Type of Furniture conservator/restorer;Living will  Healthcare Power of [place];Living will Healthcare Power of [place];Living will Living will;Healthcare Power of [employer] Power of [place];Living will  Does patient want to make changes to medical advance directive?     No - Patient declined    Copy of Healthcare Power of Attorney in Chart?    No - copy requested No - copy requested No - copy requested No - copy requested  Would patient like information on creating a medical advance directive? No - Patient declined  No - Patient declined        Current  Medications (verified) Outpatient Encounter Medications as of 06/21/2023  Medication Sig   acetaminophen (TYLENOL) 500 MG tablet Take 1,000 mg by mouth every 6 (six) hours as needed for headache (pain).   AFLIBERCEPT IO Inject into the eye See admin instructions. Right eye injection every 4 weeks   albuterol (VENTOLIN HFA) 108 (90 Base) MCG/ACT inhaler Inhale 2 puffs into the lungs every 6 (six) hours as needed for wheezing or shortness of breath.   Alirocumab (PRALUENT) 75 MG/ML SOAJ INJECT 75 MG SUBCUTANEOUSLY EVERY 14 DAYS   Bevacizumab (AVASTIN IV) Place into the left eye See admin instructions. Left eye injection every 8 weeks   cetirizine (ZYRTEC) 10 MG tablet Take 10 mg by mouth daily as needed for allergies.   clopidogrel (PLAVIX) 75 MG tablet TAKE 1 TABLET BY MOUTH EVERY DAY   diltiazem (CARDIZEM CD) 180 MG 24 hr capsule TAKE 1 CAPSULE BY MOUTH EVERY DAY   doxazosin (CARDURA) 1 MG tablet Take 1 tablet (1 mg total) by mouth 2 (two) times daily.   doxycycline (VIBRA-TABS) 100 MG tablet Take 1 tablet (100 mg total) by mouth 2 (two) times daily.   famotidine (PEPCID) 20 MG tablet TAKE 1 TABLET (20 MG TOTAL) BY MOUTH 2 (TWO) TIMES DAILY AS NEEDED FOR HEARTBURN OR INDIGESTION.   ferrous sulfate 325 (65 FE) MG tablet Take 1 tablet (325 mg total) by mouth daily with breakfast.   fluticasone (FLONASE) 50 MCG/ACT  nasal spray Place 2 sprays into both nostrils daily as needed for allergies.   loperamide (IMODIUM) 2 MG capsule Take 1 capsule (2 mg total) by mouth as needed for diarrhea or loose stools.   Multiple Vitamins-Minerals (OCUVITE ADULT 50+ PO) Take 1 capsule by mouth daily with lunch.   nitroGLYCERIN (NITROSTAT) 0.4 MG SL tablet Place 1 tablet (0.4 mg total) under the tongue every 5 (five) minutes as needed for chest pain (max 3 doses in 15 minutes).   pantoprazole (PROTONIX) 40 MG tablet TAKE 1 TABLET BY MOUTH EVERY DAY IN THE MORNING   Polyethyl Glycol-Propyl Glycol (SYSTANE) 0.4-0.3 %  SOLN Place 1 drop into both eyes 3 (three) times daily as needed (dry eyes).   polyethylene glycol powder (GLYCOLAX/MIRALAX) 17 GM/SCOOP powder Take 17 g by mouth daily as needed.   predniSONE (DELTASONE) 20 MG tablet Take 2 a day for 5 days, then 1 a day for 5 days, with food. Don't take with aleve/ibuprofen.   rOPINIRole (REQUIP) 0.5 MG tablet TAKE 1 TABLET BY MOUTH EVERYDAY AT BEDTIME   tamsulosin (FLOMAX) 0.4 MG CAPS capsule Take 2 capsules (0.8 mg total) by mouth daily.   traMADol (ULTRAM) 50 MG tablet TAKE 1 TABLET (50 MG TOTAL) BY MOUTH EVERY 8 (EIGHT) HOURS AS NEEDED. FOR PAIN   vitamin B-12 (CYANOCOBALAMIN) 1000 MCG tablet Take 2 tablets (2,000 mcg total) by mouth daily.   No facility-administered encounter medications on file as of 06/21/2023.    Allergies (verified) Angiotensin receptor blockers, Atorvastatin, Codeine, Lisinopril, Statins, and Gabapentin   History: Past Medical History:  Diagnosis Date   Back pain    improved with gabapentin as of 2015   Barrett esophagus 09/2003   Coronary artery calcification seen on CT scan    COVID-19    Diverticulitis    Diverticulosis    GERD (gastroesophageal reflux disease)    GI AVM (gastrointestinal arteriovenous vascular malformation)    Hearing loss    Hemorrhoids    History of Holter monitoring    a. holter 9/17: NSR, PVCs; no sustained arrhythmia; PVC burden 8%   History of nuclear stress test    a. Myoview 8/17: Intermediate risk, inferior ischemia, not gated   History of prostatitis    History of stroke    Hyperlipidemia    Hypertension    Macular degeneration, wet (HCC)    receiving intra-ocular injections ([place])   PUD (peptic ulcer disease)    Past Surgical History:  Procedure Laterality Date   EP IMPLANTABLE DEVICE N/A 12/24/2015   Procedure: Loop Recorder Insertion;  Surgeon: Hillis Range, MD;  Location: [facility] INVASIVE CV LAB;  Service: Cardiovascular;  Laterality: N/A;   INGUINAL HERNIA REPAIR  2001    LAPAROSCOPIC APPENDECTOMY N/A 07/07/2014   Procedure: APPENDECTOMY LAPAROSCOPIC;  Surgeon: Manus Rudd, MD;  Location: [facility] OR;  Service: General;  Laterality: N/A;   TEE WITHOUT CARDIOVERSION N/A 10/28/2015   Procedure: TRANSESOPHAGEAL ECHOCARDIOGRAM (TEE);  Surgeon: Laurey Morale, MD;  Location: [facility] ENDOSCOPY;  Service: Cardiovascular;  Laterality: N/A;   Family History  Problem Relation Age of Onset   Heart attack Father    Coronary artery disease Father    Hypertension Mother    Pancreatic cancer Mother        Died at 67.   Cancer Mother        Pancreatic cancer   Arthritis Sister    Hyperlipidemia Sister    Prostate cancer Brother    Cancer Brother  Bone Cancer secondary to Prostate Cancer   Bleeding Disorder Son    Diabetes Neg Hx    Social History   Socioeconomic History   Marital status: Married    Spouse name: [name]   Number of children: Not on file   Years of education: Not on file   Highest education level: Not on file  Occupational History   Occupation: Retired    Associate Professor: RETIRED  Tobacco Use   Smoking status: Former    Current packs/day: 0.00    Types: Cigarettes    Quit date: 1947    Years since quitting: 77.8   Smokeless tobacco: Never  Vaping Use   Vaping status: Never Used  Substance and Sexual Activity   Alcohol use: No    Alcohol/week: 0.0 standard drinks of alcohol   Drug use: No   Sexual activity: Not on file  Other Topics Concern   Not on file  Social History Narrative   6th grade. Married 1951/07/01 1 son, 1 dtr - died at 5 months. Work - Management consultant, Engineer, drilling, Catering manager.   Retired-fulltime. "[employer]". ACP - yes DNR, yes short-term mechanical ventilarion, no heroic or futile measures in face of irreversible.   Social Determinants of Health   Financial Resource Strain: Low Risk  (01/31/2022)   Overall Financial Resource Strain (CARDIA)    Difficulty of Paying Living Expenses: Not hard at all  Food Insecurity: No Food  Insecurity (01/31/2022)   Hunger Vital Sign    Worried About Running Out of Food in the Last Year: Never true    Ran Out of Food in the Last Year: Never true  Transportation Needs: No Transportation Needs (01/31/2022)   PRAPARE - Administrator, Civil Service (Medical): No    Lack of Transportation (Non-Medical): No  Physical Activity: Sufficiently Active (01/31/2022)   Exercise Vital Sign    Days of Exercise per Week: 3 days    Minutes of Exercise per Session: 60 min  Stress: No Stress Concern Present (01/31/2022)   [employer] of Occupational Health - Occupational Stress Questionnaire    Feeling of Stress : Not at all  Social Connections: Moderately Isolated (01/31/2022)   Social Connection and Isolation Panel [NHANES]    Frequency of Communication with Friends and Family: More than three times a week    Frequency of Social Gatherings with Friends and Family: Once a week    Attends Religious Services: Never    Database administrator or Organizations: No    Attends Banker Meetings: Never    Marital Status: Married    Tobacco Counseling Counseling given: Not Answered   Clinical Intake:                        Activities of Daily Living     No data to display          Patient Care Team: Joaquim Nam, MD as PCP - General (Family Medicine) Corky Crafts, MD as PCP - Cardiology (Cardiology) Kennon Rounds as Physician Assistant (Cardiology) Kathyrn Sheriff, [facility] (Inactive) as Pharmacist (Pharmacist)  Indicate any recent Medical Services you may have received from other than [facility] providers in the past year (date may be approximate).     Assessment:   This is a routine wellness examination for [name].  Hearing/Vision screen No results found.   Goals Addressed   None    Depression Screen    04/11/2023  12:27 PM 03/27/2023   12:06 PM 02/14/2023    3:26 PM 11/03/2022   12:07 PM 03/31/2022    2:56 PM  01/31/2022    2:23 PM 01/28/2021    2:04 PM  PHQ 2/9 Scores  PHQ - 2 Score 0 0 0 0 0 0 0  PHQ- 9 Score 1 1 1  0 0 0 0    Fall Risk    04/11/2023   12:24 PM 03/27/2023   12:06 PM 02/14/2023    3:26 PM 11/03/2022   12:07 PM 03/31/2022    2:55 PM  Fall Risk   Falls in the past year? 1 1 1 1 1   Number falls in past yr: 0 0 0 0 1  Injury with Fall? 0 0 0 0 0  Risk for fall due to : History of fall(s) History of fall(s) History of fall(s) History of fall(s) Other (Comment)  Follow up Falls evaluation completed Falls evaluation completed Falls evaluation completed Falls evaluation completed Falls evaluation completed    MEDICARE RISK AT HOME:    TIMED UP AND GO:  Was the test performed?  No    Cognitive Function:    01/28/2021    2:06 PM 11/29/2019    3:13 PM 10/10/2017    2:04 PM  MMSE - Mini Mental State Exam  Not completed: Refused    Orientation to time  5 5  Orientation to Place  5 5  Registration  3 3  Attention/ Calculation  5 0  Recall  3 3  Language- name 2 objects   0  Language- repeat  1 1  Language- follow 3 step command   2  Language- follow 3 step command-comments   unable to complete 1 step of 3 step command  Language- read & follow direction   0  Write a sentence   0  Copy design   0  Total score   19        Immunizations Immunization History  Administered Date(s) Administered   Fluad Quad(high Dose 65+) 06/03/2020, 06/14/2022   Influenza Split 06/07/2011, 06/07/2012   Influenza Whole 07/31/2007, 06/04/2008, 06/03/2009, 05/20/2010   Influenza, High Dose Seasonal PF 05/14/2019   Influenza,inj,Quad PF,6+ Mos 06/26/2013, 06/11/2014, 05/26/2015, 04/26/2016, 05/22/2017, 06/01/2018   Influenza-Unspecified 07/16/2021   PFIZER(Purple Top)SARS-COV-2 Vaccination 11/16/2019, 12/10/2019, 06/24/2020   Pfizer Covid-19 Vaccine Bivalent Booster 71yrs & up 06/10/2021   Pneumococcal Conjugate-13 08/12/2013   Pneumococcal Polysaccharide-23 07/29/2010   Td 07/29/2010   Tdap  12/22/2017    TDAP status: Up to date  Flu Vaccine status: Due, Education has been provided regarding the importance of this vaccine. Advised may receive this vaccine at local pharmacy or Health Dept. Aware to provide a copy of the vaccination record if obtained from local pharmacy or Health Dept. Verbalized acceptance and understanding.  Pneumococcal vaccine status: Up to date  Covid-19 vaccine status: Completed vaccines  Qualifies for Shingles Vaccine? Yes   Zostavax completed No   Shingrix Completed?: No.    Education has been provided regarding the importance of this vaccine. Patient has been advised to call insurance company to determine out of pocket expense if they have not yet received this vaccine. Advised may also receive vaccine at local pharmacy or Health Dept. Verbalized acceptance and understanding.  Screening Tests Health Maintenance  Topic Date Due   Zoster Vaccines- Shingrix (1 of 2) Never done   INFLUENZA VACCINE  03/23/2023   COVID-19 Vaccine (5 - 2023-24 season) 04/23/2023   DTaP/Tdap/Td (3 -  Td or Tdap) 12/23/2027   Pneumonia Vaccine 35+ Years old  Completed   HPV VACCINES  Aged Out    Health Maintenance  Health Maintenance Due  Topic Date Due   Zoster Vaccines- Shingrix (1 of 2) Never done   INFLUENZA VACCINE  03/23/2023   COVID-19 Vaccine (5 - 2023-24 season) 04/23/2023    Colorectal cancer screening: No longer required.   Lung Cancer Screening: (Low Dose CT Chest recommended if Age 90-80 years, 20 pack-year currently smoking OR have quit w/in 15years.) does not qualify.   Lung Cancer Screening Referral: no  Additional Screening:  Hepatitis C Screening: does not qualify; Completed no  Vision Screening: Recommended annual ophthalmology exams for early detection of glaucoma and other disorders of the eye. Is the patient up to date with their annual eye exam?  Yes  Who is the provider or what is the name of the office in which the patient attends  annual eye exams? Dr Charlotte Sanes If pt is not established with a provider, would they like to be referred to a provider to establish care? No .   Dental Screening: Recommended annual dental exams for proper oral hygiene  Diabetic Foot Exam: n/a  Community Resource Referral / Chronic Care Management: CRR required this visit?  No   CCM required this visit?  No     Plan:     I have personally reviewed and noted the following in the patient's chart:   Medical and social history Use of alcohol, tobacco or illicit drugs  Current medications and supplements including opioid prescriptions. Patient is not currently taking opioid prescriptions. Functional ability and status Nutritional status Physical activity Advanced directives List of other physicians Hospitalizations, surgeries, and ER visits in previous 12 months Vitals Screenings to include cognitive, depression, and falls Referrals and appointments  In addition, I have reviewed and discussed with patient certain preventive protocols, quality metrics, and best practice recommendations. A written personalized care plan for preventive services as well as general preventive health recommendations were provided to patient.     Sue Lush, LPN   [id number]   After Visit Summary: (Declined) Due to this being a telephonic visit, with patients personalized plan was offered to patient but patient Declined AVS at this time   Nurse Notes: I spoke with pt's wife [name] as she relays [name] is in the med sleeping. She relays he is doing very little now and they have hospice in now. She relays she is unsure if he will come get Influenza Vaccine on 06/28/23 (as schedules). I relayed she can call us and let us know if she can whether he is not coming. She relays she will do. She has no concerns or questions at this time.

## 2023-06-24 ENCOUNTER — Other Ambulatory Visit: Payer: Self-pay | Admitting: Interventional Cardiology

## 2023-06-26 ENCOUNTER — Other Ambulatory Visit (HOSPITAL_COMMUNITY): Payer: Self-pay

## 2023-06-26 ENCOUNTER — Telehealth: Payer: Self-pay | Admitting: Pharmacy Technician

## 2023-06-26 NOTE — Telephone Encounter (Signed)
Pharmacy Patient Advocate Encounter   Received notification from RX Request Messages that prior authorization for praluent is required/requested.   Insurance verification completed.   The patient is insured through [facility] ADVANTAGE/RX ADVANCE .   Per test claim: The current 06/26/23 day co-pay is, $94.00- 3 months.  No PA needed at this time. This test claim was processed through [facility]- copay amounts may vary at other pharmacies due to pharmacy/plan contracts, or as the patient moves through the different stages of their insurance plan.

## 2023-06-27 NOTE — Progress Notes (Signed)
Subjective:   [name] is a 89 y.o. male who presents for Medicare Annual/Subsequent preventive examination.  Visit Complete: Virtual I connected with  [name]  (wife and caretaker) regarding [name] on 06/29/23 by a audio enabled telemedicine application and verified that I am speaking with the correct person using two identifiers.  Patient Location: Home  Provider Location: Office/Clinic  I discussed the limitations of evaluation and management by telemedicine. The patient expressed understanding and agreed to proceed.  Vital Signs: Because this visit was a virtual/telehealth visit, some criteria may be missing or patient reported. Any vitals not documented were not able to be obtained and vitals that have been documented are patient reported.  Patient Medicare AWV questionnaire was completed by the patient on (not done); I have confirmed that all information answered by patient is correct and no changes since this date. Cardiac Risk Factors include: advanced age (>45men, >60 women);dyslipidemia;hypertension;male gender;sedentary lifestyle    Objective:    Today's Vitals   06/21/23 1335  Weight: 150 lb (68 kg)  Height: 5\' 6"  (1.676 m)   Body mass index is 24.21 kg/m.     06/21/2023    1:44 PM 04/06/2023   10:33 AM 09/16/2022    5:04 PM 01/31/2022    2:25 PM 01/28/2021    2:02 PM 09/21/2020    9:30 PM 09/21/2020    9:22 PM  Advanced Directives  Does Patient Have a Medical Advance Directive? Yes No Yes No Yes Yes Yes  Type of Estate agent of [place];Living will  Healthcare Power of [place];Living will  Healthcare Power of [place];Living will Healthcare Power of [place];Living will Living will;Healthcare Power of Attorney  Does patient want to make changes to medical advance directive?      No - Patient declined   Copy of Healthcare Power of Attorney in Chart?     No - copy requested No - copy requested No - copy requested  Would patient like  information on creating a medical advance directive?  No - Patient declined  No - Patient declined       Current Medications (verified) Outpatient Encounter Medications as of 06/21/2023  Medication Sig   acetaminophen (TYLENOL) 500 MG tablet Take 1,000 mg by mouth every 6 (six) hours as needed for headache (pain).   AFLIBERCEPT IO Inject into the eye See admin instructions. Right eye injection every 4 weeks   albuterol (VENTOLIN HFA) 108 (90 Base) MCG/ACT inhaler Inhale 2 puffs into the lungs every 6 (six) hours as needed for wheezing or shortness of breath.   Alirocumab (PRALUENT) 75 MG/ML SOAJ INJECT 75 MG SUBCUTANEOUSLY EVERY 14 DAYS   Bevacizumab (AVASTIN IV) Place into the left eye See admin instructions. Left eye injection every 8 weeks   cetirizine (ZYRTEC) 10 MG tablet Take 10 mg by mouth daily as needed for allergies.   clopidogrel (PLAVIX) 75 MG tablet TAKE 1 TABLET BY MOUTH EVERY DAY   diltiazem (CARDIZEM CD) 180 MG 24 hr capsule TAKE 1 CAPSULE BY MOUTH EVERY DAY   doxazosin (CARDURA) 1 MG tablet Take 1 tablet (1 mg total) by mouth 2 (two) times daily.   doxycycline (VIBRA-TABS) 100 MG tablet Take 1 tablet (100 mg total) by mouth 2 (two) times daily.   famotidine (PEPCID) 20 MG tablet TAKE 1 TABLET (20 MG TOTAL) BY MOUTH 2 (TWO) TIMES DAILY AS NEEDED FOR HEARTBURN OR INDIGESTION.   ferrous sulfate 325 (65 FE) MG tablet Take 1 tablet (325  mg total) by mouth daily with breakfast.   fluticasone (FLONASE) 50 MCG/ACT nasal spray Place 2 sprays into both nostrils daily as needed for allergies.   loperamide (IMODIUM) 2 MG capsule Take 1 capsule (2 mg total) by mouth as needed for diarrhea or loose stools.   Multiple Vitamins-Minerals (OCUVITE ADULT 50+ PO) Take 1 capsule by mouth daily with lunch.   nitroGLYCERIN (NITROSTAT) 0.4 MG SL tablet Place 1 tablet (0.4 mg total) under the tongue every 5 (five) minutes as needed for chest pain (max 3 doses in 15 minutes).   pantoprazole (PROTONIX)  40 MG tablet TAKE 1 TABLET BY MOUTH EVERY DAY IN THE MORNING   Polyethyl Glycol-Propyl Glycol (SYSTANE) 0.4-0.3 % SOLN Place 1 drop into both eyes 3 (three) times daily as needed (dry eyes).   polyethylene glycol powder (GLYCOLAX/MIRALAX) 17 GM/SCOOP powder Take 17 g by mouth daily as needed.   predniSONE (DELTASONE) 20 MG tablet Take 2 a day for 5 days, then 1 a day for 5 days, with food. Don't take with aleve/ibuprofen. (Patient not taking: Reported on 06/21/2023)   rOPINIRole (REQUIP) 0.5 MG tablet TAKE 1 TABLET BY MOUTH EVERYDAY AT BEDTIME   tamsulosin (FLOMAX) 0.4 MG CAPS capsule Take 2 capsules (0.8 mg total) by mouth daily.   traMADol (ULTRAM) 50 MG tablet TAKE 1 TABLET (50 MG TOTAL) BY MOUTH EVERY 8 (EIGHT) HOURS AS NEEDED. FOR PAIN   vitamin B-12 (CYANOCOBALAMIN) 1000 MCG tablet Take 2 tablets (2,000 mcg total) by mouth daily.   No facility-administered encounter medications on file as of 06/21/2023.    Allergies (verified) Angiotensin receptor blockers, Atorvastatin, Codeine, Lisinopril, Statins, and Gabapentin   History: Past Medical History:  Diagnosis Date   Back pain    improved with gabapentin as of 2015   Barrett esophagus 09/2003   Coronary artery calcification seen on CT scan    COVID-19    Diverticulitis    Diverticulosis    GERD (gastroesophageal reflux disease)    GI AVM (gastrointestinal arteriovenous vascular malformation)    Hearing loss    Hemorrhoids    History of Holter monitoring    a. holter 9/17: NSR, PVCs; no sustained arrhythmia; PVC burden 8%   History of nuclear stress test    a. Myoview 8/17: Intermediate risk, inferior ischemia, not gated   History of prostatitis    History of stroke    Hyperlipidemia    Hypertension    Macular degeneration, wet (HCC)    receiving intra-ocular injections ([place])   PUD (peptic ulcer disease)    Past Surgical History:  Procedure Laterality Date   EP IMPLANTABLE DEVICE N/A 12/24/2015   Procedure: Loop  Recorder Insertion;  Surgeon: Hillis Range, MD;  Location: [facility] INVASIVE CV LAB;  Service: Cardiovascular;  Laterality: N/A;   INGUINAL HERNIA REPAIR  2001   LAPAROSCOPIC APPENDECTOMY N/A 07/07/2014   Procedure: APPENDECTOMY LAPAROSCOPIC;  Surgeon: Manus Rudd, MD;  Location: [facility] OR;  Service: General;  Laterality: N/A;   TEE WITHOUT CARDIOVERSION N/A 10/28/2015   Procedure: TRANSESOPHAGEAL ECHOCARDIOGRAM (TEE);  Surgeon: Laurey Morale, MD;  Location: [facility] ENDOSCOPY;  Service: Cardiovascular;  Laterality: N/A;   Family History  Problem Relation Age of Onset   Heart attack Father    Coronary artery disease Father    Hypertension Mother    Pancreatic cancer Mother        Died at 52.   Cancer Mother        Pancreatic cancer   Arthritis Sister  Hyperlipidemia Sister    Prostate cancer Brother    Cancer Brother        Bone Cancer secondary to Prostate Cancer   Bleeding Disorder Son    Diabetes Neg Hx    Social History   Socioeconomic History   Marital status: Married    Spouse name: Mickeal Daws   Number of children: Not on file   Years of education: Not on file   Highest education level: Not on file  Occupational History   Occupation: Retired    Associate Professor: RETIRED  Tobacco Use   Smoking status: Former    Current packs/day: 0.00    Types: Cigarettes    Quit date: 1947    Years since quitting: 77.9   Smokeless tobacco: Never  Vaping Use   Vaping status: Never Used  Substance and Sexual Activity   Alcohol use: No    Alcohol/week: 0.0 standard drinks of alcohol   Drug use: No   Sexual activity: Not on file  Other Topics Concern   Not on file  Social History Narrative   6th grade. Married 19-Jul-1951 1 son, 1 dtr - died at 5 months. Work - Management consultant, Engineer, drilling, Catering manager.   Retired-fulltime. "[employer] ACP - yes DNR, yes short-term mechanical ventilarion, no heroic or futile measures in face of irreversible.   Social Determinants of Health   Financial Resource Strain:  Low Risk  (06/21/2023)   Overall Financial Resource Strain (CARDIA)    Difficulty of Paying Living Expenses: Not hard at all  Food Insecurity: No Food Insecurity (06/21/2023)   Hunger Vital Sign    Worried About Running Out of Food in the Last Year: Never true    Ran Out of Food in the Last Year: Never true  Transportation Needs: No Transportation Needs (06/21/2023)   PRAPARE - Administrator, Civil Service (Medical): No    Lack of Transportation (Non-Medical): No  Physical Activity: Sufficiently Active (01/31/2022)   Exercise Vital Sign    Days of Exercise per Week: 3 days    Minutes of Exercise per Session: 60 min  Stress: No Stress Concern Present (06/21/2023)   [employer] of Occupational Health - Occupational Stress Questionnaire    Feeling of Stress : Not at all  Social Connections: Socially Isolated (06/21/2023)   Social Connection and Isolation Panel [NHANES]    Frequency of Communication with Friends and Family: Never    Frequency of Social Gatherings with Friends and Family: Never    Attends Religious Services: Never    Database administrator or Organizations: No    Attends Engineer, structural: Never    Marital Status: Married    Tobacco Counseling Counseling given: Not Answered  Clinical Intake:  Pre-visit preparation completed: No  Pain : No/denies pain   BMI - recorded: 24.21 Nutritional Status: BMI of 19-24  Normal Nutritional Risks: None Diabetes: No  How often do you need to have someone help you when you read instructions, pamphlets, or other written materials from your doctor or pharmacy?: 1 - Never  Interpreter Needed?: No  Comments: lives with wife Information entered by :: B.Ossiel Marchio,LPN   Activities of Daily Living    06/21/2023    1:44 PM  In your present state of health, do you have any difficulty performing the following activities:  Hearing? 1  Vision? 1  Difficulty concentrating or making decisions? 1   Walking or climbing stairs? 1  Dressing or bathing? 1  Doing errands, shopping?  1  Preparing Food and eating ? Y  Using the Toilet? N  In the past six months, have you accidently leaked urine? Y  Do you have problems with loss of bowel control? N  Managing your Medications? Y  Managing your Finances? Y  Housekeeping or managing your Housekeeping? Y    Patient Care Team: Joaquim Nam, MD as PCP - General (Family Medicine) Corky Crafts, MD as PCP - Cardiology (Cardiology) Kennon Rounds as Physician Assistant (Cardiology) Kathyrn Sheriff, [facility] (Inactive) as Pharmacist (Pharmacist) Maris Berger, MD as Consulting Physician (Ophthalmology)  Indicate any recent Medical Services you may have received from other than [facility] providers in the past year (date may be approximate).     Assessment:   This is a routine wellness examination for [name].  Hearing/Vision screen Hearing Screening - Comments:: Wife/Emma says pt is almost deaf with hearing aids Vision Screening - Comments:: Wife says vision is ok   Goals Addressed             This Visit's Progress    DIET - EAT MORE FRUITS AND VEGETABLES   Not on track    Patient Stated   On track    11/29/2019, I will maintain and continue medications as prescribed.       Depression Screen    06/21/2023    1:42 PM 04/11/2023   12:27 PM 03/27/2023   12:06 PM 02/14/2023    3:26 PM 11/03/2022   12:07 PM 03/31/2022    2:56 PM 01/31/2022    2:23 PM  PHQ 2/9 Scores  PHQ - 2 Score 0 0 0 0 0 0 0  PHQ- 9 Score  1 1 1  0 0 0    Fall Risk    06/27/2023   11:55 AM 06/21/2023    1:42 PM 04/11/2023   12:24 PM 03/27/2023   12:06 PM 02/14/2023    3:26 PM  Fall Risk   Falls in the past year? 0 0 1 1 1   Number falls in past yr: 0 0 0 0 0  Injury with Fall? 0 0 0 0 0  Risk for fall due to : Impaired balance/gait;Impaired mobility Impaired balance/gait;Impaired mobility History of fall(s) History of fall(s) History of fall(s)   Follow up  Education provided;Falls prevention discussed Falls evaluation completed Falls evaluation completed Falls evaluation completed    MEDICARE RISK AT HOME: Medicare Risk at Home Any stairs in or around the home?: No If so, are there any without handrails?: No Home free of loose throw rugs in walkways, pet beds, electrical cords, etc?: Yes Adequate lighting in your home to reduce risk of falls?: Yes Life alert?: Yes Use of a cane, walker or w/c?: Yes Grab bars in the bathroom?: Yes Shower chair or bench in shower?: Yes Elevated toilet seat or a handicapped toilet?: Yes  TIMED UP AND GO:  Was the test performed?  No    Cognitive Function:    06/21/2023    1:47 PM 01/28/2021    2:06 PM 11/29/2019    3:13 PM 10/10/2017    2:04 PM  MMSE - Mini Mental State Exam  Not completed: Unable to complete Refused    Orientation to time   5 5  Orientation to Place   5 5  Registration   3 3  Attention/ Calculation   5 0  Recall   3 3  Language- name 2 objects    0  Language- repeat   1  1  Language- follow 3 step command    2  Language- follow 3 step command-comments    unable to complete 1 step of 3 step command  Language- read & follow direction    0  Write a sentence    0  Copy design    0  Total score    19        Immunizations Immunization History  Administered Date(s) Administered   Fluad Quad(high Dose 65+) 06/03/2020, 06/14/2022   Influenza Split 06/07/2011, 06/07/2012   Influenza Whole 07/31/2007, 06/04/2008, 06/03/2009, 05/20/2010   Influenza, High Dose Seasonal PF 05/14/2019   Influenza,inj,Quad PF,6+ Mos 06/26/2013, 06/11/2014, 05/26/2015, 04/26/2016, 05/22/2017, 06/01/2018   Influenza-Unspecified 07/16/2021   PFIZER(Purple Top)SARS-COV-2 Vaccination 11/16/2019, 12/10/2019, 06/24/2020   Pfizer Covid-19 Vaccine Bivalent Booster 5yrs & up 06/10/2021   Pneumococcal Conjugate-13 08/12/2013   Pneumococcal Polysaccharide-23 07/29/2010   Td 07/29/2010   Tdap  12/22/2017    TDAP status: Up to date  Flu Vaccine status: Due, Education has been provided regarding the importance of this vaccine. Advised may receive this vaccine at local pharmacy or Health Dept. Aware to provide a copy of the vaccination record if obtained from local pharmacy or Health Dept. Verbalized acceptance and understanding.  Pneumococcal vaccine status: Up to date  Covid-19 vaccine status: Completed vaccines  Qualifies for Shingles Vaccine? Yes   Zostavax completed No   Shingrix Completed?: No.    Education has been provided regarding the importance of this vaccine. Patient has been advised to call insurance company to determine out of pocket expense if they have not yet received this vaccine. Advised may also receive vaccine at local pharmacy or Health Dept. Verbalized acceptance and understanding.  Screening Tests Health Maintenance  Topic Date Due   COVID-19 Vaccine (5 - 2023-24 season) 07/07/2023 (Originally 04/23/2023)   Zoster Vaccines- Shingrix (1 of 2) 09/21/2023 (Originally 10/18/1975)   INFLUENZA VACCINE  11/20/2023 (Originally 03/23/2023)   DTaP/Tdap/Td (3 - Td or Tdap) 12/23/2027   Pneumonia Vaccine 51+ Years old  Completed   HPV VACCINES  Aged Out    Health Maintenance  There are no preventive care reminders to display for this patient.   Colorectal cancer screening: No longer required.   Lung Cancer Screening: (Low Dose CT Chest recommended if Age 7-80 years, 20 pack-year currently smoking OR have quit w/in 15years.) does not qualify.   Lung Cancer Screening Referral: no  Additional Screening:  Hepatitis C Screening: does not qualify; Completed no  Vision Screening: Recommended annual ophthalmology exams for early detection of glaucoma and other disorders of the eye. Is the patient up to date with their annual eye exam?  Yes  Who is the provider or what is the name of the office in which the patient attends annual eye exams? Dr Charlotte Sanes If pt is not  established with a provider, would they like to be referred to a provider to establish care? No .   Dental Screening: Recommended annual dental exams for proper oral hygiene  Diabetic Foot Exam: n/a  Community Resource Referral / Chronic Care Management: CRR required this visit?  No   CCM required this visit?  No     Plan:     I have personally reviewed and noted the following in the patient's chart:   Medical and social history Use of alcohol, tobacco or illicit drugs  Current medications and supplements including opioid prescriptions. Patient is not currently taking opioid prescriptions. Functional ability and status Nutritional status Physical activity Advanced  directives List of other physicians Hospitalizations, surgeries, and ER visits in previous 12 months Vitals Screenings to include cognitive, depression, and falls Referrals and appointments  In addition, I have reviewed and discussed with patient certain preventive protocols, quality metrics, and best practice recommendations. A written personalized care plan for preventive services as well as general preventive health recommendations were provided to patient.     Martin Lush, LPN   16/08/958   After Visit Summary: (Declined) Due to this being a telephonic visit, with patients personalized plan was offered to patient but patient Declined AVS at this time   Nurse Notes: I spoke with pt's wife [name] as she relays Mr Hires is in the med sleeping. She relays he is doing very little now and they have hospice in now. She relays she is unsure if he will come get Influenza Vaccine on 06/28/23 (as schedules). I relayed she can call us and let us know if she can whether he is not coming. She relays she will do. She has no concerns or questions at this time.

## 2023-06-28 ENCOUNTER — Ambulatory Visit: Payer: PPO

## 2023-07-06 ENCOUNTER — Other Ambulatory Visit: Payer: Self-pay | Admitting: Family Medicine

## 2023-07-06 NOTE — Telephone Encounter (Signed)
Last office visit:06/05/2023 Next office visit: Nothing scheduled  Last refill: 02/14/23 doxazosin (CARDURA) 1 MG tablet

## 2023-07-07 ENCOUNTER — Other Ambulatory Visit: Payer: Self-pay | Admitting: Family Medicine

## 2023-07-09 NOTE — Telephone Encounter (Signed)
This looks like a duplicate request so I canceled it.  It was previously refilled on 07/07/2023.

## 2023-08-02 ENCOUNTER — Telehealth: Payer: Self-pay | Admitting: Family Medicine

## 2023-08-02 NOTE — Telephone Encounter (Signed)
This kind gentleman recently passed away.  I called his wife to give my condolences.  She thanked me for the call and I thanked her for talking to me.  I was always glad to see him (and his wife).  He was unfailingly kind to me and he will be missed.  Please update the chart.  He passed away on 08/18/23.

## 2023-08-03 NOTE — Telephone Encounter (Signed)
Updated.

## 2023-08-23 DEATH — deceased

## 2023-11-16 ENCOUNTER — Ambulatory Visit: Payer: PPO | Admitting: Dermatology
